# Patient Record
Sex: Female | Born: 1995 | Race: Black or African American | Hispanic: No | Marital: Single | Smoking: Current some day smoker
Health system: Southern US, Community
[De-identification: ages and names within clinical notes are randomized; demographics above are authoritative.]

## PROBLEM LIST (undated history)

## (undated) DIAGNOSIS — D473 Essential (hemorrhagic) thrombocythemia: Secondary | ICD-10-CM

## (undated) DIAGNOSIS — I272 Pulmonary hypertension, unspecified: Secondary | ICD-10-CM

## (undated) DIAGNOSIS — Z5189 Encounter for other specified aftercare: Secondary | ICD-10-CM

## (undated) DIAGNOSIS — N390 Urinary tract infection, site not specified: Secondary | ICD-10-CM

## (undated) DIAGNOSIS — J40 Bronchitis, not specified as acute or chronic: Secondary | ICD-10-CM

## (undated) DIAGNOSIS — D571 Sickle-cell disease without crisis: Secondary | ICD-10-CM

## (undated) DIAGNOSIS — B019 Varicella without complication: Secondary | ICD-10-CM

## (undated) DIAGNOSIS — F32A Depression, unspecified: Secondary | ICD-10-CM

## (undated) DIAGNOSIS — R011 Cardiac murmur, unspecified: Secondary | ICD-10-CM

## (undated) DIAGNOSIS — R7989 Other specified abnormal findings of blood chemistry: Secondary | ICD-10-CM

## (undated) DIAGNOSIS — E559 Vitamin D deficiency, unspecified: Secondary | ICD-10-CM

## (undated) DIAGNOSIS — F329 Major depressive disorder, single episode, unspecified: Secondary | ICD-10-CM

## (undated) HISTORY — DX: Sickle-cell disease without crisis: D57.1

## (undated) HISTORY — DX: Urinary tract infection, site not specified: N39.0

## (undated) HISTORY — DX: Vitamin D deficiency, unspecified: E55.9

## (undated) HISTORY — DX: Major depressive disorder, single episode, unspecified: F32.9

## (undated) HISTORY — DX: Varicella without complication: B01.9

## (undated) HISTORY — DX: Depression, unspecified: F32.A

## (undated) HISTORY — PX: EYE SURGERY: SHX253

## (undated) HISTORY — DX: Cardiac murmur, unspecified: R01.1

## (undated) HISTORY — DX: Bronchitis, not specified as acute or chronic: J40

## (undated) HISTORY — DX: Encounter for other specified aftercare: Z51.89

## (undated) HISTORY — DX: Pulmonary hypertension, unspecified: I27.20

---
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
## 2013-12-07 ENCOUNTER — Ambulatory Visit (HOSPITAL_COMMUNITY): Admission: RE | Admit: 2013-12-07 | Payer: Self-pay | Source: Ambulatory Visit | Admitting: Obstetrics and Gynecology

## 2013-12-07 ENCOUNTER — Encounter (HOSPITAL_COMMUNITY): Admission: RE | Payer: Self-pay | Source: Ambulatory Visit

## 2013-12-07 SURGERY — LAPAROSCOPY, DIAGNOSTIC
Anesthesia: General | Laterality: Right

## 2014-05-29 DIAGNOSIS — N9489 Other specified conditions associated with female genital organs and menstrual cycle: Secondary | ICD-10-CM | POA: Insufficient documentation

## 2015-06-05 ENCOUNTER — Encounter: Payer: Self-pay | Admitting: Internal Medicine

## 2015-06-05 ENCOUNTER — Ambulatory Visit (INDEPENDENT_AMBULATORY_CARE_PROVIDER_SITE_OTHER): Payer: 59 | Admitting: Internal Medicine

## 2015-06-05 VITALS — BP 122/60 | HR 78 | Temp 99.3°F | Ht 62.5 in | Wt 124.0 lb

## 2015-06-05 DIAGNOSIS — D571 Sickle-cell disease without crisis: Secondary | ICD-10-CM | POA: Diagnosis not present

## 2015-06-05 DIAGNOSIS — J209 Acute bronchitis, unspecified: Secondary | ICD-10-CM

## 2015-06-05 MED ORDER — AMOXICILLIN-POT CLAVULANATE 875-125 MG PO TABS: 1.0000 | ORAL_TABLET | Freq: Two times a day (BID) | ORAL | Status: DC

## 2015-06-05 NOTE — Progress Notes (Signed)
   Subjective:    Patient ID: Sandra Mckay, female    DOB: 1996/07/06, 19 y.o.   MRN: [id number]  HPI She describes a cough intermittently productive of clear-yellow/green sputum up to a tablespoon a day for the last month. She's had associated itchy, watery eyes as well as sneezing. She also has nasal congestion and pain in the frontal sinuses and paranasal areas. She has some yellow nasal discharge.She also does describe some right mandibular pain. Intermittent she's had some shortness of breath and wheezing with the cough. She has used Benadryl, cough drops, and tea with only partial response.  She smokes 3 cigarettes per week or less.  She has sickle cell disease & is followed at [facility]. She has requesting a referral to Hematology division of [facility] as she works here in [place]. She is on maintenance Amoxicillin 250 mg bid.  She has recently has had myalgias. She has chronic joint symptoms.  Past medical history includes splenectomy. She states that she has had her pneumonia vaccines.   Review of Systems She denies fever, chills, or sweats.   She denies otic pain or otic discharge.     Objective:   Physical Exam  General appearance:Adequately nourished; no acute distress or increased work of breathing is present.    Lymphatic: No  lymphadenopathy about the head, neck, or axilla .  Eyes: No conjunctival inflammation or lid edema is present. There is no scleral icterus.  Ears:  External ear exam shows no significant lesions or deformities.  Otoscopic examination reveals clear canals, tympanic membranes are intact bilaterally without bulging, retraction, inflammation or discharge.  Nose:  External nasal examination shows no deformity or inflammation. Nasal mucosa are markedly erythematous without lesions or exudates. Some polypoid change in the left nare. She has a small nasal post on the right. No septal dislocation or deviation.No obstruction to airflow.   Oral exam: Dental  hygiene is good; lips and gums are healthy appearing.There is no oropharyngeal erythema or exudate .  Neck:  No deformities, thyromegaly, masses, or tenderness noted.   Supple with full range of motion without pain.   Heart:  Normal rate and regular rhythm. S1 and S2 normal without gallop, murmur, click, rub or other extra sounds.   Lungs:Chest clear to auscultation; no wheezes, rhonchi,rales ,or rubs present.  Extremities:  No cyanosis, edema, or clubbing  noted    Skin: Warm & dry w/o tenting or jaundice. No significant lesions or rash.        Assessment & Plan:  #1 acute bronchitis w/o bronchospasm #2 URI, acute Plan: See orders and recommendations

## 2015-06-05 NOTE — Patient Instructions (Addendum)
Stop the amoxicillin while you are on the Augmentin. Take the Augmentin every 12 hours on a full stomach to prevent diarrhea.  Plain Mucinex (NOT D) for thick secretions ;force NON dairy fluids .   Nasal cleansing in the shower as discussed with lather of mild shampoo.After 10 seconds wash off lather while  exhaling through nostrils. Make sure that all residual soap is removed to prevent irritation.  Flonase OR Nasacort AQ 1 spray in each nostril twice a day as needed. Use the "crossover" technique into opposite nostril spraying toward opposite ear @ 45 degree angle, not straight up into nostril.  Plain Allegra (NOT D )  160 daily , Loratidine 10 mg , OR Zyrtec 10 mg @ bedtime  as needed for itchy eyes & sneezing.  The Hematology referral will be scheduled and you'll be notified of the time.Please call the Referral Co-Ordinator @ [phone] if you have not been notified of appointment time within 7-10 days.

## 2015-06-08 ENCOUNTER — Encounter: Payer: Self-pay | Admitting: Internal Medicine

## 2015-06-08 DIAGNOSIS — D571 Sickle-cell disease without crisis: Secondary | ICD-10-CM | POA: Insufficient documentation

## 2015-07-22 ENCOUNTER — Ambulatory Visit (INDEPENDENT_AMBULATORY_CARE_PROVIDER_SITE_OTHER): Payer: 59 | Admitting: Family Medicine

## 2015-07-22 ENCOUNTER — Encounter: Payer: Self-pay | Admitting: Family Medicine

## 2015-07-22 VITALS — BP 118/71 | HR 80 | Temp 98.3°F | Resp 16 | Ht 62.0 in | Wt 131.0 lb

## 2015-07-22 DIAGNOSIS — A499 Bacterial infection, unspecified: Secondary | ICD-10-CM

## 2015-07-22 DIAGNOSIS — E559 Vitamin D deficiency, unspecified: Secondary | ICD-10-CM

## 2015-07-22 DIAGNOSIS — Z23 Encounter for immunization: Secondary | ICD-10-CM

## 2015-07-22 DIAGNOSIS — N898 Other specified noninflammatory disorders of vagina: Secondary | ICD-10-CM

## 2015-07-22 DIAGNOSIS — R8290 Unspecified abnormal findings in urine: Secondary | ICD-10-CM | POA: Diagnosis not present

## 2015-07-22 DIAGNOSIS — N760 Acute vaginitis: Secondary | ICD-10-CM | POA: Diagnosis not present

## 2015-07-22 DIAGNOSIS — D571 Sickle-cell disease without crisis: Secondary | ICD-10-CM

## 2015-07-22 DIAGNOSIS — B9689 Other specified bacterial agents as the cause of diseases classified elsewhere: Secondary | ICD-10-CM

## 2015-07-22 LAB — CBC WITH DIFFERENTIAL/PLATELET
BASOS ABS: 0.1 10*3/uL (ref 0.0–0.1)
Basophils Relative: 1 % (ref 0–1)
EOS ABS: 0.1 10*3/uL (ref 0.0–0.7)
Eosinophils Relative: 1 % (ref 0–5)
HCT: 23.8 % — ABNORMAL LOW (ref 36.0–46.0)
HEMOGLOBIN: 8.2 g/dL — AB (ref 12.0–15.0)
LYMPHS ABS: 3.1 10*3/uL (ref 0.7–4.0)
Lymphocytes Relative: 37 % (ref 12–46)
MCH: 39.2 pg — AB (ref 26.0–34.0)
MCHC: 34.5 g/dL (ref 30.0–36.0)
MCV: 113.9 fL — AB (ref 78.0–100.0)
MONOS PCT: 21 % — AB (ref 3–12)
MPV: 8.9 fL (ref 8.6–12.4)
Monocytes Absolute: 1.8 10*3/uL — ABNORMAL HIGH (ref 0.1–1.0)
NEUTROS ABS: 3.4 10*3/uL (ref 1.7–7.7)
NEUTROS PCT: 40 % — AB (ref 43–77)
PLATELETS: 490 10*3/uL — AB (ref 150–400)
RBC: 2.09 MIL/uL — ABNORMAL LOW (ref 3.87–5.11)
RDW: 18.3 % — ABNORMAL HIGH (ref 11.5–15.5)
WBC: 8.4 10*3/uL (ref 4.0–10.5)

## 2015-07-22 LAB — COMPLETE METABOLIC PANEL WITH GFR
ALT: 7 U/L (ref 6–29)
AST: 22 U/L (ref 10–30)
Albumin: 4.4 g/dL (ref 3.6–5.1)
Alkaline Phosphatase: 58 U/L (ref 33–115)
BILIRUBIN TOTAL: 3.6 mg/dL — AB (ref 0.2–1.2)
BUN: 7 mg/dL (ref 7–25)
CHLORIDE: 106 mmol/L (ref 98–110)
CO2: 21 mmol/L (ref 20–31)
CREATININE: 0.39 mg/dL — AB (ref 0.50–1.10)
Calcium: 9.1 mg/dL (ref 8.6–10.2)
GFR, Est African American: >89 mL/min (ref >=60)
GFR, Est Non African American: >89 mL/min (ref >=60)
GLUCOSE: 81 mg/dL (ref 65–99)
Potassium: 4.4 mmol/L (ref 3.5–5.3)
SODIUM: 135 mmol/L (ref 135–146)
TOTAL PROTEIN: 6.7 g/dL (ref 6.1–8.1)

## 2015-07-22 LAB — POCT WET PREP WITH KOH
KOH PREP POC: NEGATIVE
TRICHOMONAS UA: NEGATIVE

## 2015-07-22 LAB — RETICULOCYTES
ABS Retic: 415.9 10*3/uL — ABNORMAL HIGH (ref 19.0–186.0)
RBC.: 2.09 MIL/uL — ABNORMAL LOW (ref 3.87–5.11)
Retic Ct Pct: 19.9 % — ABNORMAL HIGH (ref 0.4–2.3)

## 2015-07-22 LAB — FERRITIN: Ferritin: 499 ng/mL — ABNORMAL HIGH (ref 10–291)

## 2015-07-22 MED ORDER — CLINDAMYCIN HCL 300 MG PO CAPS: 300.0000 mg | ORAL_CAPSULE | Freq: Two times a day (BID) | ORAL | Status: DC

## 2015-07-22 MED ORDER — OXYCODONE HCL 10 MG PO TABS: 10.0000 mg | ORAL_TABLET | Freq: Four times a day (QID) | ORAL | Status: DC | PRN

## 2015-07-22 MED ORDER — HYDROXYUREA 300 MG PO CAPS: 300.0000 mg | ORAL_CAPSULE | Freq: Every day | ORAL | Status: DC

## 2015-07-22 MED ORDER — IBUPROFEN 600 MG PO TABS: 600.0000 mg | ORAL_TABLET | Freq: Three times a day (TID) | ORAL | Status: DC | PRN

## 2015-07-22 MED ORDER — HYDROXYUREA 500 MG PO CAPS: 1500.0000 mg | ORAL_CAPSULE | Freq: Every day | ORAL | Status: DC

## 2015-07-22 NOTE — Progress Notes (Signed)
Subjective:    Patient ID: Sandra Mckay, female    DOB: 1996/03/18, 19 y.o.   MRN: [id number]  HPI Sandra Mckay, a 19 year old female with a history of sickle cell anemia, HbSS presents accompanied by mother to establish care. Sandra Mckay states that she has been followed by hematology at [facility], [place]. She states that she is currently followed every 3 months. She is taking hydroxyurea consistently. Patient is asplenic and takes a prophylactic antibiotic. She is currently having pain to lower back and lower extremities bilaterally. She rates pain as 7/10 described as constant and aching. She maintains that she last had Ibuprofen this am with minimal relief. She typically takes Oxycodone for moderate to severe pain, but is out of medication. She also has a history of a vitamin D deficiency and has not been taking weekly Drisdol as prescribed. Patient denies headache, fever, shortness of breath, chest pain, nausea, vomiting, or diarrhea.    Past Medical History  Diagnosis Date  . Sickle cell anemia (HCC)   . Heart murmur   . Blood transfusion without reported diagnosis   . Depression   . Chickenpox   . Bronchitis   . Urinary tract infection    There is no immunization history on file for this patient.  Social History   Social History  . Marital Status: Single    Spouse Name: N/A  . Number of Children: N/A  . Years of Education: N/A   Occupational History  . Not on file.   Social History Main Topics  . Smoking status: Current Some Day Smoker  . Smokeless tobacco: Not on file  . Alcohol Use: 0.0 oz/week    0 Standard drinks or equivalent per week  . Drug Use: Yes  . Sexual Activity: Not on file   Other Topics Concern  . Not on file   Social History Narrative  No Known Allergies Review of Systems  Constitutional: Negative.   HENT: Negative.   Eyes: Negative.  Negative for photophobia and visual disturbance.  Respiratory:  Negative.  Negative for wheezing and stridor.   Cardiovascular: Negative.   Gastrointestinal: Positive for diarrhea.  Endocrine: Negative.   Genitourinary: Positive for dysuria and vaginal discharge. Negative for hematuria.  Musculoskeletal: Negative.   Skin: Negative.   Allergic/Immunologic: Negative.  Negative for immunocompromised state.  Neurological: Negative.   Hematological: Negative.   Psychiatric/Behavioral: Negative.  Negative for suicidal ideas and sleep disturbance.      Objective:   Physical Exam  Constitutional: She is oriented to person, place, and time. She appears well-developed and well-nourished.  HENT:  Head: Normocephalic and atraumatic.  Right Ear: External ear normal.  Left Ear: External ear normal.  Mouth/Throat: Oropharynx is clear and moist.  Eyes: Conjunctivae and EOM are normal. Pupils are equal, round, and reactive to light.  Neck: Normal range of motion. Neck supple.  Cardiovascular: Normal rate, normal heart sounds and intact distal pulses.   Pulmonary/Chest: Effort normal and breath sounds normal.  Abdominal: Soft. Bowel sounds are normal.  Musculoskeletal: Normal range of motion.  Neurological: She is alert and oriented to person, place, and time. She has normal reflexes.  Skin: Skin is warm and dry.  Psychiatric: She has a normal mood and affect. Her behavior is normal. Judgment and thought content normal.      BP 118/71 mmHg  Pulse 80  Temp(Src) 98.3 F (36.8 C) (Oral)  Resp 16  Ht 5\' 2"  (1.575 m)  Wt 131 lb (59.421 kg)  BMI 23.95 kg/m2  Assessment & Plan:  1. Hb-SS disease without crisis ([place]) Sickle cell disease - Continue Hydrea 1800 mg daily. Will check baseline reticulocytes, hemoglobin, platelet count, and ANC. We discussed the need for good hydration, monitoring of hydration status, avoidance of heat, cold, stress, and infection triggers. We discussed the risks and benefits of Hydrea, including bone marrow suppression, the  possibility of GI upset, skin ulcers, hair thinning, and teratogenicity. The patient was reminded of the need to seek medical attention of any symptoms of bleeding, anemia, or infection. Continue folic acid 1 mg daily to prevent aplastic bone marrow crises. No proteinuria present.   Pulmonary evaluation - Patient denies severe recurrent wheezes, shortness of breath with exercise, or persistent cough. If these symptoms develop, pulmonary function tests with spirometry will be ordered, and if abnormal, plan on referral to Pulmonology for further evaluation.  Cardiac - Routine screening for pulmonary hypertension is not recommended.  Eye - High risk of proliferative retinopathy. Annual eye exam with retinal exam recommended to patient. Last eye appointment was in September. Dr. Shaune Spittle.   Immunization status - Patient will receive an influenza vaccination on today. Will review [facility].   Acute and chronic painful episodes - We agreed on Oxycodone 10 mg daily every 6 hours as needed # 60. We discussed that pt is to receive her Schedule II prescriptions only from [place]. Pt is also aware that the prescription history is available to [place] online through the [facility] CSRS. Controlled substance agreement signed today. We reminded Sandra Mckay that all patients receiving Schedule II narcotics must be seen for follow within one month of prescription being requested. We reviewed the terms of our pain agreement, including the need to keep medicines in a safe locked location away from children or pets, and the need to report excess sedation or constipation, measures to avoid constipation, and policies related to early refills and stolen prescriptions. According to the [place] Chronic [facility] Initiative program, we have reviewed details related to analgesia, adverse effects, aberrant behaviors.   Iron overload from chronic transfusion.  Will check ferritin level today  Vitamin D deficiency - Continue Drisdol 50,000 units weekly,   encouraged her to take it.   The above recommendations are taken from the NIH Evidence-Based Management of Sickle Cell Disease: Expert Panel Report, 20149.   Chronic medical problems including diabetes, hypertension and COPD. We recommended she try to find a PCP in [place].  - EKG 12-Lead - POCT urinalysis dipstick - COMPLETE METABOLIC PANEL WITH GFR - Reticulocytes - CBC with Differential/Platelet - oxyCODONE 10 MG TABS; Take 1 tablet (10 mg total) by mouth every 6 (six) hours as needed. for pain  Dispense: 60 tablet; Refill: 0 - hydroxyurea (HYDREA) 500 MG capsule; Take 3 capsules (1,500 mg total) by mouth daily.  Dispense: 90 capsule; Refill: 0 - hydroxyurea (DROXIA) 300 MG capsule; Take 1 capsule (300 mg total) by mouth daily.  Dispense: 30 capsule; Refill: 0 - ibuprofen (ADVIL,MOTRIN) 600 MG tablet; Take 1 tablet (600 mg total) by mouth every 8 (eight) hours as needed.  Dispense: 30 tablet; Refill: 0 - Hemoglobinopathy evaluation - Ferritin - Ambulatory referral to Hematology  2. Vitamin D deficiency  - Vitamin D, 25-hydroxy  3. Bacterial vaginosis 5-7 Clue Cells per high power field.  - clindamycin (CLEOCIN) 300 MG capsule; Take 1 capsule (300 mg total) by mouth 2 (two) times daily.  Dispense: 14 capsule; Refill: 0  4. Abnormal finding on  urinalysis Positive nitrites. Refer to #3  5. Need for immunization against influenza - Flu Vaccine QUAD 36+ mos IM (Fluarix)   Routine Health Maintenance:  Recommend pap smear Recommend barrier protection with sexual intercourse Refrain from smoking or secondhand smoke   Tasheema Perrone M, FNP

## 2015-07-22 NOTE — Patient Instructions (Addendum)
[facility] is available for acute vasoocclusive pain crisis. Hours of operation M-F 8am- 5pm.  Sickle Cell Anemia, Adult Sickle cell anemia is a condition in which red blood cells have an abnormal "sickle" shape. This abnormal shape shortens the cells' life span, which results in a lower than normal concentration of red blood cells in the blood. The sickle shape also causes the cells to clump together and block free blood flow through the blood vessels. As a result, the tissues and organs of the body do not receive enough oxygen. Sickle cell anemia causes organ damage and pain and increases the risk of infection. CAUSES  Sickle cell anemia is a genetic disorder. Those who receive two copies of the gene have the condition, and those who receive one copy have the trait. RISK FACTORS The sickle cell gene is most common in people whose families originated in [place]. Other areas of the globe where sickle cell trait occurs include the [place], [place] and [place], and the [place].  SIGNS AND SYMPTOMS  Pain, especially in the extremities, back, chest, or abdomen (common). The pain may start suddenly or may develop following an illness, especially if there is dehydration. Pain can also occur due to overexertion or exposure to extreme temperature changes.  Frequent severe bacterial infections, especially certain types of pneumonia and meningitis.  Pain and swelling in the hands and feet.  Decreased activity.   Loss of appetite.   Change in behavior.  Headaches.  Seizures.  Shortness of breath or difficulty breathing.  Vision changes.  Skin ulcers. Those with the trait may not have symptoms or they may have mild symptoms.  DIAGNOSIS  Sickle cell anemia is diagnosed with blood tests that demonstrate the genetic trait. It is often diagnosed during the newborn period, due to mandatory testing nationwide. A variety of blood tests, X-rays, CT scans, MRI  scans, ultrasounds, and lung function tests may also be done to monitor the condition. TREATMENT  Sickle cell anemia may be treated with:  Medicines. You may be given pain medicines, antibiotic medicines (to treat and prevent infections) or medicines to increase the production of certain types of hemoglobin.  Fluids.  Oxygen.  Blood transfusions. HOME CARE INSTRUCTIONS   Drink enough fluid to keep your urine clear or pale yellow. Increase your fluid intake in hot weather and during exercise.  Do not smoke. Smoking lowers oxygen levels in the blood.   Only take over-the-counter or prescription medicines for pain, fever, or discomfort as directed by your health care provider.  Take antibiotics as directed by your health care provider. Make sure you finish them it even if you start to feel better.   Take supplements as directed by your health care provider.   Consider wearing a medical alert bracelet. This tells anyone caring for you in an emergency of your condition.   When traveling, keep your medical information, health care provider's names, and the medicines you take with you at all times.   If you develop a fever, do not take medicines to reduce the fever right away. This could cover up a problem that is developing. Notify your health care provider.  Keep all follow-up appointments with your health care provider. Sickle cell anemia requires regular medical care. SEEK MEDICAL CARE IF: You have a fever. SEEK IMMEDIATE MEDICAL CARE IF:   You feel dizzy or faint.   You have new abdominal pain, especially on the left side near the stomach area.  You develop a persistent, often uncomfortable and painful penile erection (priapism). If this is not treated immediately it will lead to impotence.   You have numbness your arms or legs or you have a hard time moving them.   You have a hard time with speech.   You have a fever or persistent symptoms for more than 2-3  days.   You have a fever and your symptoms suddenly get worse.   You have signs or symptoms of infection. These include:   Chills.   Abnormal tiredness (lethargy).   Irritability.   Poor eating.   Vomiting.   You develop pain that is not helped with medicine.   You develop shortness of breath.  You have pain in your chest.   You are coughing up pus-like or bloody sputum.   You develop a stiff neck.  Your feet or hands swell or have pain.  Your abdomen appears bloated.  You develop joint pain. MAKE SURE YOU:  Understand these instructions.   This information is not intended to replace advice given to you by your health care provider. Make sure you discuss any questions you have with your health care provider.   Document Released: 10/28/2005 Document Revised: 08/10/2014 Document Reviewed: 03/01/2013 Elsevier Interactive Patient Education 2016 Elsevier Inc. Bacterial Vaginosis Bacterial vaginosis is a vaginal infection that occurs when the normal balance of bacteria in the vagina is disrupted. It results from an overgrowth of certain bacteria. This is the most common vaginal infection in women of childbearing age. Treatment is important to prevent complications, especially in pregnant women, as it can cause a premature delivery. CAUSES  Bacterial vaginosis is caused by an increase in harmful bacteria that are normally present in smaller amounts in the vagina. Several different kinds of bacteria can cause bacterial vaginosis. However, the reason that the condition develops is not fully understood. RISK FACTORS Certain activities or behaviors can put you at an increased risk of developing bacterial vaginosis, including:  Having a new sex partner or multiple sex partners.  Douching.  Using an intrauterine device (IUD) for contraception. Women do not get bacterial vaginosis from toilet seats, bedding, swimming pools, or contact with objects around them. SIGNS  AND SYMPTOMS  Some women with bacterial vaginosis have no signs or symptoms. Common symptoms include:  Grey vaginal discharge.  A fishlike odor with discharge, especially after sexual intercourse.  Itching or burning of the vagina and vulva.  Burning or pain with urination. DIAGNOSIS  Your health care provider will take a medical history and examine the vagina for signs of bacterial vaginosis. A sample of vaginal fluid may be taken. Your health care provider will look at this sample under a microscope to check for bacteria and abnormal cells. A vaginal pH test may also be done.  TREATMENT  Bacterial vaginosis may be treated with antibiotic medicines. These may be given in the form of a pill or a vaginal cream. A second round of antibiotics may be prescribed if the condition comes back after treatment. Because bacterial vaginosis increases your risk for sexually transmitted diseases, getting treated can help reduce your risk for chlamydia, gonorrhea, HIV, and herpes. HOME CARE INSTRUCTIONS   Only take over-the-counter or prescription medicines as directed by your health care provider.  If antibiotic medicine was prescribed, take it as directed. Make sure you finish it even if you start to feel better.  Tell all sexual partners that you have a vaginal infection. They should see their health care provider  and be treated if they have problems, such as a mild rash or itching.  During treatment, it is important that you follow these instructions:  Avoid sexual activity or use condoms correctly.  Do not douche.  Avoid alcohol as directed by your health care provider.  Avoid breastfeeding as directed by your health care provider. SEEK MEDICAL CARE IF:   Your symptoms are not improving after 3 days of treatment.  You have increased discharge or pain.  You have a fever. MAKE SURE YOU:   Understand these instructions.  Will watch your condition.  Will get help right away if you are  not doing well or get worse. FOR MORE INFORMATION  Centers for Disease Control and Prevention, Division of STD Prevention: [employer]raud[employer] American Sexual Health Association (ASHA): www.ashastd.org    This information is not intended to replace advice given to you by your health care provider. Make sure you discuss any questions you have with your health care provider.   Document Released: 07/20/2005 Document Revised: 08/10/2014 Document Reviewed: 03/01/2013 Elsevier Interactive Patient Education [employer].

## 2015-07-23 LAB — VITAMIN D 25 HYDROXY (VIT D DEFICIENCY, FRACTURES): VIT D 25 HYDROXY: 10 ng/mL — AB (ref 30–100)

## 2015-07-23 LAB — POCT URINALYSIS DIP (DEVICE)
Bilirubin Urine: NEGATIVE
Glucose, UA: NEGATIVE mg/dL
Ketones, ur: NEGATIVE mg/dL
NITRITE: POSITIVE — AB
PH: 7 (ref 5.0–8.0)
Protein, ur: NEGATIVE mg/dL
Specific Gravity, Urine: 1.015 (ref 1.005–1.030)
UROBILINOGEN UA: 2 mg/dL — AB (ref 0.0–1.0)

## 2015-07-24 LAB — HEMOGLOBINOPATHY EVALUATION
HGB A: 0 % — AB (ref 96.8–97.8)
HGB F QUANT: 16.5 % — AB (ref 0.0–2.0)
HGB S QUANTITAION: 80.5 % — AB
Hemoglobin Other: 0 %
Hgb A2 Quant: 3 % (ref 2.2–3.2)

## 2015-08-18 ENCOUNTER — Other Ambulatory Visit: Payer: Self-pay | Admitting: Family Medicine

## 2015-08-24 ENCOUNTER — Encounter (HOSPITAL_COMMUNITY): Payer: Self-pay | Admitting: *Deleted

## 2015-08-24 ENCOUNTER — Emergency Department (HOSPITAL_COMMUNITY)
Admission: EM | Admit: 2015-08-24 | Discharge: 2015-08-25 | Disposition: A | Discharge status: Home / Self Care | Payer: 59 | Attending: Emergency Medicine | Admitting: Emergency Medicine

## 2015-08-24 DIAGNOSIS — Z87440 Personal history of urinary (tract) infections: Secondary | ICD-10-CM | POA: Diagnosis not present

## 2015-08-24 DIAGNOSIS — F329 Major depressive disorder, single episode, unspecified: Secondary | ICD-10-CM | POA: Diagnosis not present

## 2015-08-24 DIAGNOSIS — R011 Cardiac murmur, unspecified: Secondary | ICD-10-CM | POA: Insufficient documentation

## 2015-08-24 DIAGNOSIS — Z79899 Other long term (current) drug therapy: Secondary | ICD-10-CM | POA: Diagnosis not present

## 2015-08-24 DIAGNOSIS — Z8619 Personal history of other infectious and parasitic diseases: Secondary | ICD-10-CM | POA: Insufficient documentation

## 2015-08-24 DIAGNOSIS — Z8709 Personal history of other diseases of the respiratory system: Secondary | ICD-10-CM | POA: Insufficient documentation

## 2015-08-24 DIAGNOSIS — D5700 Hb-SS disease with crisis, unspecified: Secondary | ICD-10-CM | POA: Diagnosis present

## 2015-08-24 DIAGNOSIS — Z87891 Personal history of nicotine dependence: Secondary | ICD-10-CM | POA: Diagnosis not present

## 2015-08-24 LAB — CBC WITH DIFFERENTIAL/PLATELET
BASOS ABS: 0.1 10*3/uL (ref 0.0–0.1)
BASOS PCT: 1 %
Eosinophils Absolute: 0 10*3/uL (ref 0.0–0.7)
Eosinophils Relative: 0 %
HCT: 21.8 % — ABNORMAL LOW (ref 36.0–46.0)
Hemoglobin: 7.8 g/dL — ABNORMAL LOW (ref 12.0–15.0)
Lymphocytes Relative: 38 %
Lymphs Abs: 3.7 10*3/uL (ref 0.7–4.0)
MCH: 39.2 pg — ABNORMAL HIGH (ref 26.0–34.0)
MCHC: 35.8 g/dL (ref 30.0–36.0)
MCV: 109.5 fL — AB (ref 78.0–100.0)
MONOS PCT: 19 %
Monocytes Absolute: 1.9 10*3/uL — ABNORMAL HIGH (ref 0.1–1.0)
NEUTROS ABS: 4.1 10*3/uL (ref 1.7–7.7)
NEUTROS PCT: 42 %
PLATELETS: 476 10*3/uL — AB (ref 150–400)
RBC: 1.99 MIL/uL — AB (ref 3.87–5.11)
RDW: 18.5 % — AB (ref 11.5–15.5)
WBC: 9.5 10*3/uL (ref 4.0–10.5)

## 2015-08-24 LAB — COMPREHENSIVE METABOLIC PANEL
ALBUMIN: 5.3 g/dL — AB (ref 3.5–5.0)
ALK PHOS: 71 U/L (ref 38–126)
ALT: 15 U/L (ref 14–54)
AST: 37 U/L (ref 15–41)
Anion gap: 10 (ref 5–15)
BUN: 7 mg/dL (ref 6–20)
CALCIUM: 9.5 mg/dL (ref 8.9–10.3)
CO2: 22 mmol/L (ref 22–32)
Chloride: 106 mmol/L (ref 101–111)
Creatinine, Ser: 0.39 mg/dL — ABNORMAL LOW (ref 0.44–1.00)
Glucose, Bld: 102 mg/dL — ABNORMAL HIGH (ref 65–99)
Potassium: 4 mmol/L (ref 3.5–5.1)
Sodium: 138 mmol/L (ref 135–145)
Total Bilirubin: 4.2 mg/dL — ABNORMAL HIGH (ref 0.3–1.2)
Total Protein: 7.7 g/dL (ref 6.5–8.1)

## 2015-08-24 LAB — RETICULOCYTES
RBC.: 1.97 MIL/uL — ABNORMAL LOW (ref 3.87–5.11)
Retic Count, Absolute: 449.2 10*3/uL — ABNORMAL HIGH (ref 19.0–186.0)
Retic Ct Pct: 22.8 % — ABNORMAL HIGH (ref 0.4–3.1)

## 2015-08-24 MED ORDER — SODIUM CHLORIDE 0.9 % IV BOLUS (SEPSIS)
1000.0000 mL | Freq: Once | INTRAVENOUS | Status: AC
  Administered 2015-08-24: 1000 mL via INTRAVENOUS

## 2015-08-24 MED ORDER — HYDROMORPHONE HCL 2 MG/ML IJ SOLN
2.0000 mg | Freq: Once | INTRAMUSCULAR | Status: AC
  Administered 2015-08-24: 2 mg via INTRAVENOUS
  Filled 2015-08-24: qty 1

## 2015-08-24 MED ORDER — HYDROMORPHONE HCL 1 MG/ML IJ SOLN
1.0000 mg | Freq: Once | INTRAMUSCULAR | Status: AC
Start: 2015-08-24 — End: 2015-08-24
  Administered 2015-08-24: 1 mg via INTRAVENOUS
  Filled 2015-08-24: qty 1

## 2015-08-24 MED ORDER — ONDANSETRON HCL 4 MG/2ML IJ SOLN
4.0000 mg | Freq: Once | INTRAMUSCULAR | Status: AC
  Administered 2015-08-24: 4 mg via INTRAVENOUS
  Filled 2015-08-24: qty 2

## 2015-08-24 NOTE — Discharge Instructions (Signed)
Please follow up with your pain management specialist and provider managing sickle cell. Please read attached info and sickle cell center follow-up information   Sickle Cell Anemia, Adult Sickle cell anemia is a condition in which red blood cells have an abnormal "sickle" shape. This abnormal shape shortens the cells' life span, which results in a lower than normal concentration of red blood cells in the blood. The sickle shape also causes the cells to clump together and block free blood flow through the blood vessels. As a result, the tissues and organs of the body do not receive enough oxygen. Sickle cell anemia causes organ damage and pain and increases the risk of infection. CAUSES  Sickle cell anemia is a genetic disorder. Those who receive two copies of the gene have the condition, and those who receive one copy have the trait. RISK FACTORS The sickle cell gene is most common in people whose families originated in [place]. Other areas of the globe where sickle cell trait occurs include the [place], [place] and [place], and the [place].  SIGNS AND SYMPTOMS  Pain, especially in the extremities, back, chest, or abdomen (common). The pain may start suddenly or may develop following an illness, especially if there is dehydration. Pain can also occur due to overexertion or exposure to extreme temperature changes.  Frequent severe bacterial infections, especially certain types of pneumonia and meningitis.  Pain and swelling in the hands and feet.  Decreased activity.   Loss of appetite.   Change in behavior.  Headaches.  Seizures.  Shortness of breath or difficulty breathing.  Vision changes.  Skin ulcers. Those with the trait may not have symptoms or they may have mild symptoms.  DIAGNOSIS  Sickle cell anemia is diagnosed with blood tests that demonstrate the genetic trait. It is often diagnosed during the newborn period, due to mandatory testing  nationwide. A variety of blood tests, X-rays, CT scans, MRI scans, ultrasounds, and lung function tests may also be done to monitor the condition. TREATMENT  Sickle cell anemia may be treated with:  Medicines. You may be given pain medicines, antibiotic medicines (to treat and prevent infections) or medicines to increase the production of certain types of hemoglobin.  Fluids.  Oxygen.  Blood transfusions. HOME CARE INSTRUCTIONS   Drink enough fluid to keep your urine clear or pale yellow. Increase your fluid intake in hot weather and during exercise.  Do not smoke. Smoking lowers oxygen levels in the blood.   Only take over-the-counter or prescription medicines for pain, fever, or discomfort as directed by your health care provider.  Take antibiotics as directed by your health care provider. Make sure you finish them it even if you start to feel better.   Take supplements as directed by your health care provider.   Consider wearing a medical alert bracelet. This tells anyone caring for you in an emergency of your condition.   When traveling, keep your medical information, health care provider's names, and the medicines you take with you at all times.   If you develop a fever, do not take medicines to reduce the fever right away. This could cover up a problem that is developing. Notify your health care provider.  Keep all follow-up appointments with your health care provider. Sickle cell anemia requires regular medical care. SEEK MEDICAL CARE IF: You have a fever. SEEK IMMEDIATE MEDICAL CARE IF:   You feel dizzy or faint.   You have new abdominal pain, especially on the left  side near the stomach area.   You develop a persistent, often uncomfortable and painful penile erection (priapism). If this is not treated immediately it will lead to impotence.   You have numbness your arms or legs or you have a hard time moving them.   You have a hard time with speech.    You have a fever or persistent symptoms for more than 2-3 days.   You have a fever and your symptoms suddenly get worse.   You have signs or symptoms of infection. These include:   Chills.   Abnormal tiredness (lethargy).   Irritability.   Poor eating.   Vomiting.   You develop pain that is not helped with medicine.   You develop shortness of breath.  You have pain in your chest.   You are coughing up pus-like or bloody sputum.   You develop a stiff neck.  Your feet or hands swell or have pain.  Your abdomen appears bloated.  You develop joint pain. MAKE SURE YOU:  Understand these instructions.   This information is not intended to replace advice given to you by your health care provider. Make sure you discuss any questions you have with your health care provider.   Document Released: 10/28/2005 Document Revised: 08/10/2014 Document Reviewed: 03/01/2013 Elsevier Interactive Patient Education [employer].

## 2015-08-24 NOTE — ED Notes (Signed)
Per pt report: pt began having pain in her lower back and bilateral legs since yesterday.  Pt took oxycodone yesterday with no relief.  Pt states pain is the same as other sickle cell crisis pains.  Pt reports nausea, vomiting, and chills.  Pt denies SOB.  Pt a/o x 4 and ambulatory.

## 2015-08-24 NOTE — ED Provider Notes (Signed)
CSN: [id number]     Arrival date & time 08/24/15  1422 History   First MD Initiated Contact with Patient 08/24/15 1522     Chief Complaint  Patient presents with  . Sickle Cell Pain Crisis    HPI   20 year old female presents today with sickle cell pain crisis. Patient reports symptoms started last night with pain in her bilateral lower legs. She describes this pain as sharp pain, typical of her typical sickle cell pain crisis sees. Patient reports that she has no chest pain, shortness of breath, abdominal pain, fever, chills, neck stiffness, headache swelling of the lower extremity. She reports that she is normally seen at [facility], but does not feel she is receiving adequate care there. She notes that she normally takes oxycodone 10 mg, but did not take any today as she has run out of her prescriptions.  Past Medical History  Diagnosis Date  . Sickle cell anemia (HCC)   . Heart murmur   . Blood transfusion without reported diagnosis   . Depression   . Chickenpox   . Bronchitis   . Urinary tract infection    Past Surgical History  Procedure Laterality Date  . Cholecystectomy  2011  . Tonsillectomy  2012  . 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 Labial adhesion lysis  1999  . Eye surgery      Sty removal   Family History  Problem Relation Age of Onset  . Arthritis Other     grandparent  . Stroke Other   . Hypertension Other   . Diabetes      grandparent  . Cancer - Other      Glioblastoma   Social History  Substance Use Topics  . Smoking status: Former Research scientist (life sciences)  . Smokeless tobacco: None  . Alcohol Use: 0.0 oz/week    0 Standard drinks or equivalent per week     Comment: occ   OB History    No data available     Review of Systems  All other systems reviewed and are negative.   Allergies  Review of patient's allergies indicates no known allergies.  Home Medications   Prior to Admission medications   Medication  Sig Start Date End Date Taking? Authorizing Provider  ibuprofen (ADVIL,MOTRIN) 200 MG tablet Take 200 mg by mouth every 6 (six) hours as needed for moderate pain.   Yes Historical Provider, MD  Oxcarbazepine (TRILEPTAL) 300 MG tablet Take 300 mg by mouth 2 (two) times daily.    Yes Historical Provider, MD  oxyCODONE 10 MG TABS Take 1 tablet (10 mg total) by mouth every 6 (six) hours as needed. for pain 07/22/15  Yes Dorena Dew, FNP  clindamycin (CLEOCIN) 300 MG capsule Take 1 capsule (300 mg total) by mouth 2 (two) times daily. Patient not taking: Reported on 08/24/2015 07/22/15   Dorena Dew, FNP  folic acid (FOLVITE) 1 MG tablet Take 1 tablet by mouth daily. 10/11/14   Historical Provider, MD  hydroxyurea (DROXIA) 300 MG capsule Take 1 capsule (300 mg total) by mouth daily. 07/22/15   Dorena Dew, FNP  hydroxyurea (HYDREA) 500 MG capsule TAKE 3 CAPSULES (1,500 MG TOTAL) BY MOUTH DAILY. 08/19/15   Dorena Dew, FNP  ibuprofen (ADVIL,MOTRIN) 600 MG tablet Take 1 tablet (600 mg total) by mouth every 8 (eight) hours as needed. 07/22/15   Dorena Dew, FNP  Vitamin D, Ergocalciferol, (DRISDOL) 50000  UNITS CAPS capsule Take 50,000 Units by mouth every 7 (seven) days. Saturday. 09/07/14 09/07/15  Historical Provider, MD   BP 96/55 mmHg  Pulse 75  Temp(Src) 97.8 F (36.6 C) (Oral)  Resp 12  SpO2 95%   Physical Exam  Constitutional: She is oriented to person, place, and time. She appears well-developed and well-nourished.  HENT:  Head: Normocephalic and atraumatic.  Eyes: Conjunctivae are normal. Pupils are equal, round, and reactive to light. Right eye exhibits no discharge. Left eye exhibits no discharge. No scleral icterus.  Neck: Normal range of motion. Neck supple. No JVD present. No tracheal deviation present.  Cardiovascular: Normal rate, regular rhythm, normal heart sounds and intact distal pulses.  Exam reveals no gallop.   No murmur heard. Pulmonary/Chest: Effort normal  and breath sounds normal. No stridor. No respiratory distress. She has no wheezes. She has no rales. She exhibits no tenderness.  Nontender to palpation  Abdominal: Soft. She exhibits no distension and no mass. There is no tenderness. There is no rebound and no guarding.  Musculoskeletal: Normal range of motion. She exhibits no edema or tenderness.  Joints are supple with full active range of motion compartments are soft. She has tenderness to palpation of the bilateral lower extremities, no swelling or edema noted.  Neurological: She is alert and oriented to person, place, and time. Coordination normal.  Skin: Skin is warm and dry. No rash noted. No erythema. No pallor.  Psychiatric: She has a normal mood and affect. Her behavior is normal. Judgment and thought content normal.  Nursing note and vitals reviewed.   ED Course  Procedures (including critical care time) Labs Review Labs Reviewed  COMPREHENSIVE METABOLIC PANEL - Abnormal; Notable for the following:    Glucose, Bld 102 (*)    Creatinine, Ser 0.39 (*)    Albumin 5.3 (*)    Total Bilirubin 4.2 (*)    All other components within normal limits  CBC WITH DIFFERENTIAL/PLATELET - Abnormal; Notable for the following:    RBC 1.99 (*)    Hemoglobin 7.8 (*)    HCT 21.8 (*)    MCV 109.5 (*)    MCH 39.2 (*)    RDW 18.5 (*)    Platelets 476 (*)    Monocytes Absolute 1.9 (*)    All other components within normal limits  RETICULOCYTES - Abnormal; Notable for the following:    Retic Ct Pct 22.8 (*)    RBC. 1.97 (*)    Retic Count, Manual 449.2 (*)    All other components within normal limits    Imaging Review No results found. I have personally reviewed and evaluated these images and lab results as part of my medical decision-making.   EKG Interpretation None      MDM   Final diagnoses:  Sickle cell pain crisis (HCC)    Labs: CMP, CBC, reticulocyte- reticulocyte count 449, hemoglobin 7.8 down from 8.2 on  07/22/2015  Imaging:   Consults:  Therapeutics:  Discharge Meds:   Assessment/Plan:  20 year old female with history sickle cell presents today and likely acute pain crisis. She has a hemoglobin 7.8 down from 8.2 on 07/22/2015. This does not indicate a significant drop in hemoglobin; reticulocyte count 449. Patient's pain was adequately managed here in the ED, she has no concerning signs for stroke, acute chest, renal infarction, or bone infarction, MI, or DVT. Patient requesting refill of her narcotic pain medication, I checked her previous records from [facility] and appears that she has  requested refills, which was against her pain contract. Patient is instructed to contact specialists at [place], sickle cell pain clinic here. She is given strict return precautions the event new or worsening signs or symptoms present. Patient verbalized understanding and agreement for today's plan.        Okey Regal, PA-C 08/24/15 [id number]  Daleen Bo, MD 08/25/15 [phone]

## 2015-08-25 ENCOUNTER — Other Ambulatory Visit: Payer: Self-pay | Admitting: Family Medicine

## 2015-08-27 ENCOUNTER — Ambulatory Visit: Payer: 59 | Admitting: Internal Medicine

## 2015-08-27 ENCOUNTER — Other Ambulatory Visit: Payer: Self-pay | Admitting: Internal Medicine

## 2015-08-27 MED ORDER — VITAMIN D (ERGOCALCIFEROL) 1.25 MG (50000 UNIT) PO CAPS: 50000.0000 [IU] | ORAL_CAPSULE | ORAL | Status: DC

## 2015-08-27 NOTE — Telephone Encounter (Signed)
Refill request for oxycodone 10mg . LOV 07/22/2015. Please advise. Thanks!

## 2015-08-30 ENCOUNTER — Telehealth: Payer: Self-pay

## 2015-08-30 ENCOUNTER — Other Ambulatory Visit: Payer: Self-pay

## 2015-08-30 DIAGNOSIS — D571 Sickle-cell disease without crisis: Secondary | ICD-10-CM

## 2015-08-30 MED ORDER — OXYCODONE HCL 10 MG PO TABS: 10.0000 mg | ORAL_TABLET | Freq: Four times a day (QID) | ORAL | Status: DC | PRN

## 2015-08-30 NOTE — Telephone Encounter (Signed)
Reviewed  Substance Reporting system prior to prescribing opiate medications, no inconsistencies noted.   Meds ordered this encounter  Medications  . Oxycodone HCl 10 MG TABS    Sig: Take 1 tablet (10 mg total) by mouth every 6 (six) hours as needed. for pain    Dispense:  60 tablet    Refill:  0    Order Specific Question:  Supervising Provider    Answer:  Tresa Garter [id number]    Dorena Dew, FNP

## 2015-08-30 NOTE — Telephone Encounter (Signed)
Refill request for oxycodone 10mg . LOV 07/22/2015. Please advise. Thanks!

## 2015-08-30 NOTE — Telephone Encounter (Signed)
I tried to call patient to advise of rx ready for pick up. Phone when straight to answering service and no voicemail has been set up yet. Thanks!

## 2015-09-09 ENCOUNTER — Encounter: Payer: Self-pay | Admitting: Family Medicine

## 2015-09-09 ENCOUNTER — Ambulatory Visit (INDEPENDENT_AMBULATORY_CARE_PROVIDER_SITE_OTHER): Payer: 59 | Admitting: Family Medicine

## 2015-09-09 VITALS — BP 111/69 | HR 76 | Temp 98.4°F | Resp 16 | Ht 62.0 in | Wt 128.0 lb

## 2015-09-09 DIAGNOSIS — D571 Sickle-cell disease without crisis: Secondary | ICD-10-CM

## 2015-09-09 DIAGNOSIS — R8290 Unspecified abnormal findings in urine: Secondary | ICD-10-CM | POA: Diagnosis not present

## 2015-09-09 DIAGNOSIS — R110 Nausea: Secondary | ICD-10-CM

## 2015-09-09 HISTORY — DX: Nausea: R11.0

## 2015-09-09 LAB — POCT URINALYSIS DIP (DEVICE)
GLUCOSE, UA: NEGATIVE mg/dL
KETONES UR: NEGATIVE mg/dL
LEUKOCYTES UA: NEGATIVE
Nitrite: POSITIVE — AB
Protein, ur: NEGATIVE mg/dL
SPECIFIC GRAVITY, URINE: 1.015 (ref 1.005–1.030)
Urobilinogen, UA: 2 mg/dL — ABNORMAL HIGH (ref 0.0–1.0)
pH: 6 (ref 5.0–8.0)

## 2015-09-09 MED ORDER — ONDANSETRON HCL 4 MG PO TABS: 4.0000 mg | ORAL_TABLET | Freq: Three times a day (TID) | ORAL | Status: DC | PRN

## 2015-09-09 NOTE — Progress Notes (Signed)
Subjective:    Patient ID: Sandra Mckay, female    DOB: 1995/12/29, 20 y.o.   MRN: [id number]  HPI [name], a 20 year old female with a history of sickle cell anemia, HbSS presents for a 1 month follow up. She maintains that she has been doing well on current medication regimen.  [name] states that she has been followed [facility]. She states that she is currently followed every 3 months. She is taking hydroxyurea consistently. Patient is asplenic and takes a prophylactic antibiotic. She is currently not having pain. She typically takes Oxycodone for moderate to severe pain, but is out of medication. She also has a history of a vitamin D deficiency and has been taking weekly Sandra Mckay as prescribed. Patient denies headache, fever, shortness of breath, chest pain,vomiting, or diarrhea.    Past Medical History  Diagnosis Date  . Sickle cell anemia (HCC)   . Heart murmur   . Blood transfusion without reported diagnosis   . Depression   . Chickenpox   . Bronchitis   . Urinary tract infection    Immunization History  Administered Date(s) Administered  . Influenza,inj,Quad PF,36+ Mos 07/22/2015    Social History   Social History  . Marital Status: Single    Spouse Name: N/A  . Number of Children: N/A  . Years of Education: N/A   Occupational History  . Not on file.   Social History Main Topics  . Smoking status: Former Research scientist (life sciences)  . Smokeless tobacco: Not on file  . Alcohol Use: 0.0 oz/week    0 Standard drinks or equivalent per week     Comment: occ  . Drug Use: No  . Sexual Activity: Yes    Birth Control/ Protection: Implant   Other Topics Concern  . Not on file   Social History Narrative  No Known Allergies Review of Systems  Constitutional: Negative.   HENT: Negative.   Eyes: Negative.  Negative for photophobia and visual disturbance.  Respiratory: Negative.  Negative for wheezing and stridor.    Cardiovascular: Negative.   Endocrine: Negative.   Genitourinary: Negative for hematuria.  Musculoskeletal: Negative.   Skin: Negative.   Allergic/Immunologic: Negative.  Negative for immunocompromised state.  Neurological: Negative.   Hematological: Negative.   Psychiatric/Behavioral: Negative.  Negative for suicidal ideas and sleep disturbance.      Objective:   Physical Exam  Constitutional: She is oriented to person, place, and time. She appears well-developed and well-nourished.  HENT:  Head: Normocephalic and atraumatic.  Right Ear: External ear normal.  Left Ear: External ear normal.  Mouth/Throat: Oropharynx is clear and moist.  Eyes: Conjunctivae and EOM are normal. Pupils are equal, round, and reactive to light.  Neck: Normal range of motion. Neck supple.  Cardiovascular: Normal rate, normal heart sounds and intact distal pulses.   Pulmonary/Chest: Effort normal and breath sounds normal.  Abdominal: Soft. Bowel sounds are normal.  Musculoskeletal: Normal range of motion.  Neurological: She is alert and oriented to person, place, and time. She has normal reflexes.  Skin: Skin is warm and dry.  Psychiatric: She has a normal mood and affect. Her behavior is normal. Judgment and thought content normal.      BP 111/69 mmHg  Pulse 76  Temp(Src) 98.4 F (36.9 C) (Oral)  Resp 16  Ht 5\' 2"  (1.575 m)  Wt 128 lb (58.06 kg)  BMI 23.41 kg/m2  Assessment & Plan:  1. Hb-SS  disease without crisis ([place]) Sickle cell disease - Continue Hydrea 1800 mg daily. Will check baseline reticulocytes, hemoglobin, platelet count, and ANC. We discussed the need for good hydration, monitoring of hydration status, avoidance of heat, cold, stress, and infection triggers. We discussed the risks and benefits of Hydrea, including bone marrow suppression, the possibility of GI upset, skin ulcers, hair thinning, and teratogenicity. The patient was reminded of the need to seek medical attention of any  symptoms of bleeding, anemia, or infection. Continue folic acid 1 mg daily to prevent aplastic bone marrow crises. No proteinuria present.   Pulmonary evaluation - Patient denies severe recurrent wheezes, shortness of breath with exercise, or persistent cough. If these symptoms develop, pulmonary function tests with spirometry will be ordered, and if abnormal, plan on referral to Pulmonology for further evaluation.  Cardiac - Routine screening for pulmonary hypertension is not recommended.  Eye - High risk of proliferative retinopathy. Annual eye exam with retinal exam recommended to patient. Last eye appointment was in September. Dr. Shaune Spittle.   Immunization status - Patient will receive an influenza vaccination on today. Will review [facility].   Acute and chronic painful episodes - We agreed on Oxycodone 10 mg daily. We discussed that pt is to receive her Schedule II prescriptions only from [place]. Pt is also aware that the prescription history is available to [place] online through the [facility] CSRS. Controlled substance agreement signed today. We reminded Sandra Mckay that all patients receiving Schedule II narcotics must be seen for follow within one month of prescription being requested. We reviewed the terms of our pain agreement, including the need to keep medicines in a safe locked location away from children or pets, and the need to report excess sedation or constipation, measures to avoid constipation, and policies related to early refills and stolen prescriptions. According to the [place] [facility] program, we have reviewed details related to analgesia, adverse effects, aberrant behaviors.  - POCT urinalysis dipstick  2. Nausea without vomiting - ondansetron (ZOFRAN) 4 MG tablet; Take 1 tablet (4 mg total) by mouth every 8 (eight) hours as needed for nausea or vomiting.  Dispense: 20 tablet; Refill: 0  3. Abnormal finding on urinalysis - Urine culture  Routine Health Maintenance:   Recommend pap smear Recommend barrier protection with sexual intercourse Refrain from smoking or secondhand smoke   Dorena Dew, FNP

## 2015-09-10 LAB — URINE CULTURE: Colony Count: 3000

## 2015-09-14 ENCOUNTER — Other Ambulatory Visit: Payer: Self-pay | Admitting: Family Medicine

## 2015-10-04 ENCOUNTER — Telehealth: Payer: Self-pay | Admitting: *Deleted

## 2015-10-04 DIAGNOSIS — D571 Sickle-cell disease without crisis: Secondary | ICD-10-CM

## 2015-10-04 NOTE — Telephone Encounter (Signed)
Refill request for oxycodone. LOV 09/09/2015. Please advise. Thanks!

## 2015-10-04 NOTE — Telephone Encounter (Signed)
Pt called and needs a refill of her oxycodone. Please advise provider. Thanks

## 2015-10-07 MED ORDER — OXYCODONE HCL 10 MG PO TABS
10.0000 mg | ORAL_TABLET | Freq: Four times a day (QID) | ORAL | Status: DC | PRN
Start: 2015-10-07 — End: 2015-11-08

## 2015-10-07 NOTE — Telephone Encounter (Signed)
Reviewed [place] Substance Reporting system prior to prescribing opiate medications, no inconsistencies noted.   Meds ordered this encounter  Medications  . Oxycodone HCl 10 MG TABS    Sig: Take 1 tablet (10 mg total) by mouth every 6 (six) hours as needed. for pain    Dispense:  60 tablet    Refill:  0    Order Specific Question:  Supervising Provider    Answer:  Tresa Garter [id number]    Dorena Dew, FNP

## 2015-10-17 ENCOUNTER — Other Ambulatory Visit: Payer: Self-pay | Admitting: Family Medicine

## 2015-10-27 ENCOUNTER — Emergency Department (HOSPITAL_COMMUNITY)
Admission: EM | Admit: 2015-10-27 | Discharge: 2015-10-28 | Disposition: A | Discharge status: Home / Self Care | Payer: 59 | Attending: Emergency Medicine | Admitting: Emergency Medicine

## 2015-10-27 ENCOUNTER — Encounter (HOSPITAL_COMMUNITY): Payer: Self-pay | Admitting: Emergency Medicine

## 2015-10-27 DIAGNOSIS — Z8619 Personal history of other infectious and parasitic diseases: Secondary | ICD-10-CM | POA: Diagnosis not present

## 2015-10-27 DIAGNOSIS — Y9289 Other specified places as the place of occurrence of the external cause: Secondary | ICD-10-CM | POA: Diagnosis not present

## 2015-10-27 DIAGNOSIS — Z3202 Encounter for pregnancy test, result negative: Secondary | ICD-10-CM | POA: Diagnosis not present

## 2015-10-27 DIAGNOSIS — Z87440 Personal history of urinary (tract) infections: Secondary | ICD-10-CM | POA: Diagnosis not present

## 2015-10-27 DIAGNOSIS — Y998 Other external cause status: Secondary | ICD-10-CM | POA: Diagnosis not present

## 2015-10-27 DIAGNOSIS — IMO0001 Reserved for inherently not codable concepts without codable children: Secondary | ICD-10-CM

## 2015-10-27 DIAGNOSIS — Z8709 Personal history of other diseases of the respiratory system: Secondary | ICD-10-CM | POA: Insufficient documentation

## 2015-10-27 DIAGNOSIS — X58XXXA Exposure to other specified factors, initial encounter: Secondary | ICD-10-CM | POA: Insufficient documentation

## 2015-10-27 DIAGNOSIS — F329 Major depressive disorder, single episode, unspecified: Secondary | ICD-10-CM | POA: Insufficient documentation

## 2015-10-27 DIAGNOSIS — D5700 Hb-SS disease with crisis, unspecified: Secondary | ICD-10-CM | POA: Insufficient documentation

## 2015-10-27 DIAGNOSIS — R011 Cardiac murmur, unspecified: Secondary | ICD-10-CM | POA: Insufficient documentation

## 2015-10-27 DIAGNOSIS — T148 Other injury of unspecified body region: Secondary | ICD-10-CM | POA: Insufficient documentation

## 2015-10-27 DIAGNOSIS — Z79899 Other long term (current) drug therapy: Secondary | ICD-10-CM | POA: Insufficient documentation

## 2015-10-27 DIAGNOSIS — Z87891 Personal history of nicotine dependence: Secondary | ICD-10-CM | POA: Insufficient documentation

## 2015-10-27 DIAGNOSIS — Y9389 Activity, other specified: Secondary | ICD-10-CM | POA: Insufficient documentation

## 2015-10-27 LAB — COMPREHENSIVE METABOLIC PANEL
ALBUMIN: 4.6 g/dL (ref 3.5–5.0)
ALK PHOS: 61 U/L (ref 38–126)
ALT: 11 U/L — ABNORMAL LOW (ref 14–54)
ANION GAP: 8 (ref 5–15)
AST: 22 U/L (ref 15–41)
BILIRUBIN TOTAL: 3 mg/dL — AB (ref 0.3–1.2)
BUN: 6 mg/dL (ref 6–20)
CALCIUM: 9.5 mg/dL (ref 8.9–10.3)
CO2: 23 mmol/L (ref 22–32)
Chloride: 108 mmol/L (ref 101–111)
Creatinine, Ser: 0.55 mg/dL (ref 0.44–1.00)
GFR calc Af Amer: >60 mL/min (ref >60)
GFR calc non Af Amer: >60 mL/min (ref >60)
GLUCOSE: 88 mg/dL (ref 65–99)
POTASSIUM: 3.9 mmol/L (ref 3.5–5.1)
SODIUM: 139 mmol/L (ref 135–145)
TOTAL PROTEIN: 7 g/dL (ref 6.5–8.1)

## 2015-10-27 LAB — CBC WITH DIFFERENTIAL/PLATELET
BASOS PCT: 1 %
Basophils Absolute: 0.1 10*3/uL (ref 0.0–0.1)
EOS ABS: 0.1 10*3/uL (ref 0.0–0.7)
EOS PCT: 1 %
HCT: 21.3 % — ABNORMAL LOW (ref 36.0–46.0)
Hemoglobin: 7.8 g/dL — ABNORMAL LOW (ref 12.0–15.0)
LYMPHS PCT: 37 %
Lymphs Abs: 4 10*3/uL (ref 0.7–4.0)
MCH: 41.7 pg — ABNORMAL HIGH (ref 26.0–34.0)
MCHC: 36.6 g/dL — AB (ref 30.0–36.0)
MCV: 113.9 fL — ABNORMAL HIGH (ref 78.0–100.0)
MONO ABS: 2 10*3/uL — AB (ref 0.1–1.0)
MONOS PCT: 19 %
NEUTROS ABS: 4.7 10*3/uL (ref 1.7–7.7)
Neutrophils Relative %: 43 %
Platelets: 457 10*3/uL — ABNORMAL HIGH (ref 150–400)
RBC: 1.87 MIL/uL — ABNORMAL LOW (ref 3.87–5.11)
RDW: 18.8 % — AB (ref 11.5–15.5)
WBC: 10.8 10*3/uL — ABNORMAL HIGH (ref 4.0–10.5)

## 2015-10-27 LAB — RETICULOCYTES: RBC.: 1.91 MIL/uL — ABNORMAL LOW (ref 3.87–5.11)

## 2015-10-27 LAB — I-STAT BETA HCG BLOOD, ED (MC, WL, AP ONLY)

## 2015-10-27 MED ORDER — SODIUM CHLORIDE 0.9 % IV BOLUS (SEPSIS)
1000.0000 mL | Freq: Once | INTRAVENOUS | Status: AC
  Administered 2015-10-27: 1000 mL via INTRAVENOUS

## 2015-10-27 MED ORDER — HYDROMORPHONE HCL 1 MG/ML IJ SOLN
1.0000 mg | Freq: Once | INTRAMUSCULAR | Status: AC
  Administered 2015-10-27: 1 mg via INTRAVENOUS
  Filled 2015-10-27: qty 1

## 2015-10-27 MED ORDER — HYDROMORPHONE HCL 1 MG/ML IJ SOLN
1.0000 mg | Freq: Once | INTRAMUSCULAR | Status: AC
Start: 2015-10-27 — End: 2015-10-27
  Administered 2015-10-27: 1 mg via INTRAVENOUS
  Filled 2015-10-27: qty 1

## 2015-10-27 NOTE — ED Notes (Signed)
Pt states she is hurting in her wrists, head, lower back and her knees and lower legs. States it feels like a sickle cell crisis. Started earlier today. Did not take anything at home for it. Alert and oriented.

## 2015-10-27 NOTE — ED Provider Notes (Signed)
CSN: [id number]     Arrival date & time 10/27/15  1855 History   First MD Initiated Contact with Patient 10/27/15 1909     Chief Complaint  Patient presents with  . Sickle Cell Pain Crisis     (Consider location/radiation/quality/duration/timing/severity/associated sxs/prior Treatment) Patient is a 20 y.o. female presenting with sickle cell pain. The history is provided by the patient and medical records.  Sickle Cell Pain Crisis   20 year old female with history of sickle cell anemia, depression , presenting to the ED for sickle cell pain crisis. She reports this began earlier this morning and has been ongoing throughout the day today. She endorses pain of her neck, head, wrists, low back, knees, and lower legs. She states her pain is consistent with her typical  Sickle cell pain crisis. She denies any numbness or weakness. No new injuries or trauma. Patient has hydroxyurea, Motrin, and oxycodone at home but she did not take any of her home meds prior to coming to the ED today. She denies any fever or chills. No abdominal pain, chest pain, shortness of breath, nausea, vomiting, or diarrhea.   No dizziness, numbness, weakness , difficulty walking, changes in speech, visual disturbance , tinnitus, etc.No recent hospitalizations for anemia or pain control.  VSS.  Past Medical History  Diagnosis Date  . Sickle cell anemia (HCC)   . Heart murmur   . Blood transfusion without reported diagnosis   . Depression   . Chickenpox   . Bronchitis   . Urinary tract infection    Past Surgical History  Procedure Laterality Date  . Cholecystectomy  2011  . Tonsillectomy  2012  . 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 Labial adhesion lysis  1999  . Eye surgery      Sty removal   Family History  Problem Relation Age of Onset  . Arthritis Other     grandparent  . Stroke Other   . Hypertension Other   . Diabetes      grandparent  . Cancer - Other     Glioblastoma   Social History  Substance Use Topics  . Smoking status: Former Research scientist (life sciences)  . Smokeless tobacco: None  . Alcohol Use: 0.0 oz/week    0 Standard drinks or equivalent per week     Comment: occ   OB History    No data available     Review of Systems  Musculoskeletal: Positive for back pain and arthralgias.  All other systems reviewed and are negative.     Allergies  Review of patient's allergies indicates no known allergies.  Home Medications   Prior to Admission medications   Medication Sig Start Date End Date Taking? Authorizing Provider  DROXIA 300 MG capsule TAKE 1 CAPSULE (300 MG TOTAL) BY MOUTH DAILY. 10/17/15  Yes Dorena Dew, FNP  folic acid (FOLVITE) 1 MG tablet Take 1 tablet by mouth daily. 10/11/14  Yes Historical Provider, MD  hydroxyurea (HYDREA) 500 MG capsule TAKE 3 CAPSULES (1,500 MG TOTAL) BY MOUTH DAILY. 09/16/15  Yes Dorena Dew, FNP  ibuprofen (ADVIL,MOTRIN) 200 MG tablet Take 400 mg by mouth every 6 (six) hours as needed for moderate pain. Reported on 10/27/2015   Yes Historical Provider, MD  ibuprofen (ADVIL,MOTRIN) 600 MG tablet Take 1 tablet (600 mg total) by mouth every 8 (eight) hours as needed. Patient taking differently: Take 600 mg by mouth every 8 (eight) hours as needed for moderate pain.  07/22/15  Yes Dorena Dew, FNP  ondansetron (ZOFRAN) 4 MG tablet Take 1 tablet (4 mg total) by mouth every 8 (eight) hours as needed for nausea or vomiting. 09/09/15  Yes Dorena Dew, FNP  Oxcarbazepine (TRILEPTAL) 300 MG tablet Take 300 mg by mouth 2 (two) times daily. Reported on 10/27/2015   Yes Historical Provider, MD  Oxycodone HCl 10 MG TABS Take 1 tablet (10 mg total) by mouth every 6 (six) hours as needed. for pain 10/07/15  Yes Dorena Dew, FNP  Vitamin D, Ergocalciferol, (DRISDOL) 50000 units CAPS capsule Take 1 capsule (50,000 Units total) by mouth every 7 (seven) days. Saturday. Patient taking differently: Take 50,000 Units by  mouth every Saturday. Saturday. 08/27/15 08/26/16 Yes Dorena Dew, FNP  clindamycin (CLEOCIN) 300 MG capsule Take 1 capsule (300 mg total) by mouth 2 (two) times daily. Patient not taking: Reported on 08/24/2015 07/22/15   Dorena Dew, FNP   BP 116/65 mmHg  Pulse 99  Temp(Src) 98.4 F (36.9 C) (Oral)  Resp 19  SpO2 98%   Physical Exam  Constitutional: She is oriented to person, place, and time. She appears well-developed and well-nourished. No distress.  HENT:  Head: Normocephalic and atraumatic.  Mouth/Throat: Oropharynx is clear and moist.  Eyes: Conjunctivae and EOM are normal. Pupils are equal, round, and reactive to light.  Neck: Normal range of motion and full passive range of motion without pain. Neck supple. No spinous process tenderness and no muscular tenderness present. No rigidity.  No meningeal signs  Cardiovascular: Normal rate, regular rhythm and normal heart sounds.   Pulmonary/Chest: Effort normal and breath sounds normal. No respiratory distress. She has no wheezes.  Abdominal: Soft. Bowel sounds are normal. There is no tenderness. There is no guarding.  Musculoskeletal: Normal range of motion. She exhibits no edema.  Neurological: She is alert and oriented to person, place, and time.  AAOx3, answering questions appropriately; equal strength UE and LE bilaterally; CN grossly intact; moves all extremities appropriately without ataxia; no focal neuro deficits or facial asymmetry appreciated  Skin: Skin is warm and dry. She is not diaphoretic.  Psychiatric: She has a normal mood and affect.  Nursing note and vitals reviewed.   ED Course  Procedures (including critical care time) Labs Review Labs Reviewed  COMPREHENSIVE METABOLIC PANEL - Abnormal; Notable for the following:    ALT 11 (*)    Total Bilirubin 3.0 (*)    All other components within normal limits  CBC WITH DIFFERENTIAL/PLATELET - Abnormal; Notable for the following:    WBC 10.8 (*)    RBC 1.87  (*)    Hemoglobin 7.8 (*)    HCT 21.3 (*)    MCV 113.9 (*)    MCH 41.7 (*)    MCHC 36.6 (*)    RDW 18.8 (*)    Platelets 457 (*)    Monocytes Absolute 2.0 (*)    All other components within normal limits  RETICULOCYTES - Abnormal; Notable for the following:    Retic Ct Pct >23.0 (*)    RBC. 1.91 (*)    All other components within normal limits  I-STAT BETA HCG BLOOD, ED ([facility], [facility], [facility] ONLY)    Imaging Review No results found. I have personally reviewed and evaluated these images and lab results as part of my medical decision-making.   EKG Interpretation None      MDM   Final diagnoses:  Sickle cell pain crisis ([place])   20 year old female  here with sickle cell pain crisis. Reports pain of multiple areas including wrists, head, low back, knees, and lower legs. States pain is typical of sickle cell pain crisis. No neurologic deficits. No clinical signs of meningitis. No chest pain, shortness of breath, fever, or cough. Do not suspect acute chest syndrome. Labwork is reassuring but clinically consistent with crisis.   We'll and for pain control.   Patient given Dilaudid, IV fluids.  11:54 PM Patient currently sleeping after second dose of medications.  She is in NAD.  Will discharge home.  Recommend to take home pain meds, follow-up with PCP.  Discussed plan with patient, he/she acknowledged understanding and agreed with plan of care.  Return precautions given for new or worsening symptoms.  Larene Pickett, PA-C 10/28/15 0106  Lacretia Leigh, MD 10/31/15 [phone]

## 2015-10-28 MED ORDER — HYDROMORPHONE HCL 1 MG/ML IJ SOLN
1.0000 mg | Freq: Once | INTRAMUSCULAR | Status: AC
  Administered 2015-10-28: 1 mg via INTRAVENOUS
  Filled 2015-10-28: qty 1

## 2015-10-28 NOTE — Discharge Instructions (Signed)
Take your home pain medications as directed. Follow-up with your primary care physician. Return to the ED for new or worsening symptoms.

## 2015-11-05 ENCOUNTER — Other Ambulatory Visit: Payer: Self-pay | Admitting: *Deleted

## 2015-11-05 ENCOUNTER — Telehealth: Payer: Self-pay

## 2015-11-05 DIAGNOSIS — D571 Sickle-cell disease without crisis: Secondary | ICD-10-CM

## 2015-11-05 MED ORDER — FOLIC ACID 1 MG PO TABS: 1.0000 mg | ORAL_TABLET | Freq: Every day | ORAL | Status: DC

## 2015-11-05 NOTE — Telephone Encounter (Signed)
Message was routed to provider for approval

## 2015-11-05 NOTE — Telephone Encounter (Signed)
Refill request for percocet, LOV 09/09/2015. Please advise. Thanks!

## 2015-11-05 NOTE — Telephone Encounter (Signed)
Pt called requesting medication refills on Folic Acid and Oxycodone. Thanks!

## 2015-11-05 NOTE — Telephone Encounter (Signed)
Patient was last seen on 09/09/15. Patient is requesting a refill on Folic Acid which was prescribed by a historical provider on 10/11/14. Patient is also requesting a refill on Oxycodone which was last filled on 10/07/15.

## 2015-11-08 MED ORDER — OXYCODONE HCL 10 MG PO TABS: 10.0000 mg | ORAL_TABLET | Freq: Four times a day (QID) | ORAL | Status: DC | PRN

## 2015-11-08 NOTE — Telephone Encounter (Signed)
Reviewed [place] Substance Reporting system prior to prescribing opiate medications, no inconsistencies noted.   Meds ordered this encounter  Medications  . folic acid (FOLVITE) 1 MG tablet    Sig: Take 1 tablet (1 mg total) by mouth daily.    Dispense:  30 tablet    Refill:  3  . Oxycodone HCl 10 MG TABS    Sig: Take 1 tablet (10 mg total) by mouth every 6 (six) hours as needed. for pain    Dispense:  60 tablet    Refill:  0    Order Specific Question:  Supervising Provider    Answer:  Tresa Garter [id number]    Dorena Dew, FNP

## 2015-11-10 ENCOUNTER — Other Ambulatory Visit: Payer: Self-pay | Admitting: Family Medicine

## 2015-12-10 ENCOUNTER — Ambulatory Visit: Payer: 59 | Admitting: Internal Medicine

## 2015-12-13 ENCOUNTER — Telehealth: Payer: Self-pay

## 2015-12-13 NOTE — Telephone Encounter (Signed)
Pt is requesting a medication refill for Oxycodone. Thanks!

## 2015-12-13 NOTE — Telephone Encounter (Signed)
Refill request for oxycodone. LOV 09/09/2015. Please advise. Thanks!

## 2015-12-17 ENCOUNTER — Encounter: Payer: Self-pay | Admitting: Internal Medicine

## 2015-12-17 ENCOUNTER — Ambulatory Visit (INDEPENDENT_AMBULATORY_CARE_PROVIDER_SITE_OTHER): Payer: 59 | Admitting: Internal Medicine

## 2015-12-17 VITALS — Ht 63.0 in | Wt 127.0 lb

## 2015-12-17 DIAGNOSIS — D571 Sickle-cell disease without crisis: Secondary | ICD-10-CM

## 2015-12-17 MED ORDER — IBUPROFEN 600 MG PO TABS: 600.0000 mg | ORAL_TABLET | Freq: Three times a day (TID) | ORAL | Status: DC | PRN

## 2015-12-17 MED ORDER — OXYCODONE HCL 10 MG PO TABS: 10.0000 mg | ORAL_TABLET | Freq: Four times a day (QID) | ORAL | Status: DC | PRN

## 2015-12-17 MED ORDER — FOLIC ACID 1 MG PO TABS: 1.0000 mg | ORAL_TABLET | Freq: Every day | ORAL | Status: DC

## 2015-12-17 NOTE — Patient Instructions (Signed)
Sickle Cell Anemia, Adult Sickle cell anemia is a condition in which red blood cells have an abnormal "sickle" shape. This abnormal shape shortens the cells' life span, which results in a lower than normal concentration of red blood cells in the blood. The sickle shape also causes the cells to clump together and block free blood flow through the blood vessels. As a result, the tissues and organs of the body do not receive enough oxygen. Sickle cell anemia causes organ damage and pain and increases the risk of infection. CAUSES  Sickle cell anemia is a genetic disorder. Those who receive two copies of the gene have the condition, and those who receive one copy have the trait. RISK FACTORS The sickle cell gene is most common in people whose families originated in [place]. Other areas of the globe where sickle cell trait occurs include the Mediterranean, [place] and [place], the [place], and the [place].  SIGNS AND SYMPTOMS  Pain, especially in the extremities, back, chest, or abdomen (common). The pain may start suddenly or may develop following an illness, especially if there is dehydration. Pain can also occur due to overexertion or exposure to extreme temperature changes.  Frequent severe bacterial infections, especially certain types of pneumonia and meningitis.  Pain and swelling in the hands and feet.  Decreased activity.   Loss of appetite.   Change in behavior.  Headaches.  Seizures.  Shortness of breath or difficulty breathing.  Vision changes.  Skin ulcers. Those with the trait may not have symptoms or they may have mild symptoms.  DIAGNOSIS  Sickle cell anemia is diagnosed with blood tests that demonstrate the genetic trait. It is often diagnosed during the newborn period, due to mandatory testing nationwide. A variety of blood tests, X-rays, CT scans, MRI scans, ultrasounds, and lung function tests may also be done to monitor the condition. TREATMENT  Sickle  cell anemia may be treated with:  Medicines. You may be given pain medicines, antibiotic medicines (to treat and prevent infections) or medicines to increase the production of certain types of hemoglobin.  Fluids.  Oxygen.  Blood transfusions. HOME CARE INSTRUCTIONS   Drink enough fluid to keep your urine clear or pale yellow. Increase your fluid intake in hot weather and during exercise.  Do not smoke. Smoking lowers oxygen levels in the blood.   Only take over-the-counter or prescription medicines for pain, fever, or discomfort as directed by your health care provider.  Take antibiotics as directed by your health care provider. Make sure you finish them it even if you start to feel better.   Take supplements as directed by your health care provider.   Consider wearing a medical alert bracelet. This tells anyone caring for you in an emergency of your condition.   When traveling, keep your medical information, health care provider's names, and the medicines you take with you at all times.   If you develop a fever, do not take medicines to reduce the fever right away. This could cover up a problem that is developing. Notify your health care provider.  Keep all follow-up appointments with your health care provider. Sickle cell anemia requires regular medical care. SEEK MEDICAL CARE IF: You have a fever. SEEK IMMEDIATE MEDICAL CARE IF:   You feel dizzy or faint.   You have new abdominal pain, especially on the left side near the stomach area.   You develop a persistent, often uncomfortable and painful penile erection (priapism). If this is not treated immediately it   will lead to impotence.   You have numbness your arms or legs or you have a hard time moving them.   You have a hard time with speech.   You have a fever or persistent symptoms for more than 2-3 days.   You have a fever and your symptoms suddenly get worse.   You have signs or symptoms of infection.  These include:   Chills.   Abnormal tiredness (lethargy).   Irritability.   Poor eating.   Vomiting.   You develop pain that is not helped with medicine.   You develop shortness of breath.  You have pain in your chest.   You are coughing up pus-like or bloody sputum.   You develop a stiff neck.  Your feet or hands swell or have pain.  Your abdomen appears bloated.  You develop joint pain. MAKE SURE YOU:  Understand these instructions.   This information is not intended to replace advice given to you by your health care provider. Make sure you discuss any questions you have with your health care provider.   Document Released: 10/28/2005 Document Revised: 08/10/2014 Document Reviewed: 03/01/2013 Elsevier Interactive Patient Education 2016 Elsevier Inc.  
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
## 2015-12-17 NOTE — Progress Notes (Signed)
Patient is here for medication refill.  Patient complains of upper back pain being present today. Currently scaled at a 5.   Patient is upset due to relationship concerns. Patient denies any suicidal ideations today.

## 2016-01-21 ENCOUNTER — Emergency Department (HOSPITAL_COMMUNITY)
Admission: EM | Admit: 2016-01-21 | Discharge: 2016-01-21 | Disposition: A | Discharge status: Home / Self Care | Payer: 59 | Attending: Emergency Medicine | Admitting: Emergency Medicine

## 2016-01-21 ENCOUNTER — Encounter (HOSPITAL_COMMUNITY): Payer: Self-pay

## 2016-01-21 ENCOUNTER — Telehealth: Payer: Self-pay | Admitting: Hematology

## 2016-01-21 ENCOUNTER — Emergency Department (HOSPITAL_COMMUNITY): Payer: 59

## 2016-01-21 DIAGNOSIS — F329 Major depressive disorder, single episode, unspecified: Secondary | ICD-10-CM | POA: Insufficient documentation

## 2016-01-21 DIAGNOSIS — D5700 Hb-SS disease with crisis, unspecified: Secondary | ICD-10-CM | POA: Diagnosis present

## 2016-01-21 DIAGNOSIS — Z87891 Personal history of nicotine dependence: Secondary | ICD-10-CM | POA: Insufficient documentation

## 2016-01-21 DIAGNOSIS — R112 Nausea with vomiting, unspecified: Secondary | ICD-10-CM | POA: Diagnosis not present

## 2016-01-21 LAB — CBC WITH DIFFERENTIAL/PLATELET
BASOS ABS: 0.1 10*3/uL (ref 0.0–0.1)
Basophils Relative: 1 %
EOS ABS: 0 10*3/uL (ref 0.0–0.7)
Eosinophils Relative: 0 %
HCT: 19.1 % — ABNORMAL LOW (ref 36.0–46.0)
Hemoglobin: 7 g/dL — ABNORMAL LOW (ref 12.0–15.0)
LYMPHS ABS: 4 10*3/uL (ref 0.7–4.0)
Lymphocytes Relative: 37 %
MCH: 38.5 pg — ABNORMAL HIGH (ref 26.0–34.0)
MCHC: 36.6 g/dL — AB (ref 30.0–36.0)
MCV: 104.9 fL — ABNORMAL HIGH (ref 78.0–100.0)
MONO ABS: 1.7 10*3/uL — AB (ref 0.1–1.0)
MONOS PCT: 16 %
NEUTROS ABS: 5 10*3/uL (ref 1.7–7.7)
Neutrophils Relative %: 46 %
PLATELETS: 468 10*3/uL — AB (ref 150–400)
RBC: 1.82 MIL/uL — AB (ref 3.87–5.11)
RDW: 19.6 % — AB (ref 11.5–15.5)
WBC: 10.8 10*3/uL — AB (ref 4.0–10.5)

## 2016-01-21 LAB — COMPREHENSIVE METABOLIC PANEL
ALK PHOS: 54 U/L (ref 38–126)
ALT: 18 U/L (ref 14–54)
AST: 36 U/L (ref 15–41)
Albumin: 5.1 g/dL — ABNORMAL HIGH (ref 3.5–5.0)
Anion gap: 9 (ref 5–15)
BILIRUBIN TOTAL: 3.7 mg/dL — AB (ref 0.3–1.2)
BUN: 6 mg/dL (ref 6–20)
CALCIUM: 9.8 mg/dL (ref 8.9–10.3)
CO2: 20 mmol/L — ABNORMAL LOW (ref 22–32)
CREATININE: 0.37 mg/dL — AB (ref 0.44–1.00)
Chloride: 107 mmol/L (ref 101–111)
GFR calc Af Amer: >60 mL/min (ref >60)
Glucose, Bld: 83 mg/dL (ref 65–99)
POTASSIUM: 3.9 mmol/L (ref 3.5–5.1)
Sodium: 136 mmol/L (ref 135–145)
TOTAL PROTEIN: 7.3 g/dL (ref 6.5–8.1)

## 2016-01-21 LAB — URINALYSIS, ROUTINE W REFLEX MICROSCOPIC
Glucose, UA: NEGATIVE mg/dL
KETONES UR: NEGATIVE mg/dL
LEUKOCYTES UA: NEGATIVE
NITRITE: NEGATIVE
PROTEIN: NEGATIVE mg/dL
Specific Gravity, Urine: 1.015 (ref 1.005–1.030)
pH: 6 (ref 5.0–8.0)

## 2016-01-21 LAB — I-STAT BETA HCG BLOOD, ED (MC, WL, AP ONLY): I-stat hCG, quantitative: <5 m[IU]/mL (ref <5)

## 2016-01-21 LAB — URINE MICROSCOPIC-ADD ON

## 2016-01-21 LAB — RETICULOCYTES: Retic Ct Pct: >23 % — ABNORMAL HIGH (ref 0.4–3.1)

## 2016-01-21 MED ORDER — HYDROMORPHONE HCL 2 MG/ML IJ SOLN: 0.0375 mg/kg | INTRAMUSCULAR | Status: AC

## 2016-01-21 MED ORDER — HYDROMORPHONE HCL 2 MG/ML IJ SOLN
0.0250 mg/kg | INTRAMUSCULAR | Status: AC
  Administered 2016-01-21: 1.4 mg via INTRAVENOUS

## 2016-01-21 MED ORDER — HYDROMORPHONE HCL 2 MG/ML IJ SOLN
0.0313 mg/kg | INTRAMUSCULAR | Status: AC
  Administered 2016-01-21: 1.8 mg via INTRAVENOUS
  Filled 2016-01-21: qty 1

## 2016-01-21 MED ORDER — ONDANSETRON HCL 4 MG/2ML IJ SOLN
4.0000 mg | INTRAMUSCULAR | Status: DC | PRN
  Administered 2016-01-21: 4 mg via INTRAVENOUS
  Filled 2016-01-21: qty 2

## 2016-01-21 MED ORDER — SODIUM CHLORIDE 0.45 % IV SOLN
INTRAVENOUS | Status: DC
  Administered 2016-01-21: 18:00:00 via INTRAVENOUS

## 2016-01-21 MED ORDER — FAMOTIDINE IN NACL 20-0.9 MG/50ML-% IV SOLN
20.0000 mg | Freq: Once | INTRAVENOUS | Status: AC
  Administered 2016-01-21: 20 mg via INTRAVENOUS
  Filled 2016-01-21: qty 50

## 2016-01-21 MED ORDER — HYDROMORPHONE HCL 2 MG/ML IJ SOLN
0.0250 mg/kg | INTRAMUSCULAR | Status: AC
  Filled 2016-01-21: qty 1

## 2016-01-21 MED ORDER — HYDROMORPHONE HCL 2 MG/ML IJ SOLN: 0.0313 mg/kg | INTRAMUSCULAR | Status: AC

## 2016-01-21 MED ORDER — ONDANSETRON 4 MG PO TBDP: 4.0000 mg | ORAL_TABLET | Freq: Three times a day (TID) | ORAL | Status: DC | PRN

## 2016-01-21 MED ORDER — HYDROMORPHONE HCL 2 MG/ML IJ SOLN
0.0375 mg/kg | INTRAMUSCULAR | Status: AC
  Administered 2016-01-21: 2.1 mg via INTRAVENOUS
  Filled 2016-01-21: qty 2

## 2016-01-21 MED ORDER — PANTOPRAZOLE SODIUM 20 MG PO TBEC: 20.0000 mg | DELAYED_RELEASE_TABLET | Freq: Every day | ORAL | Status: DC

## 2016-01-21 NOTE — ED Provider Notes (Signed)
CSN: [id number]     Arrival date & time 01/21/16  1421 History   First MD Initiated Contact with Patient 01/21/16 1711     Chief Complaint  Patient presents with  . Sickle Cell Pain Crisis  . Emesis     (Consider location/radiation/quality/duration/timing/severity/associated sxs/prior Treatment) HPI   Sandra Mckay is a 20 y.o F with a pmhx of sickle cell anemia Who presents to the ED today complaining of abdominal pain, vomiting and body aches. Patient states that for the last 2 days she has felt an achy pain in her epigastrium. Patient states "it feels like I ate something really big". Patient reports associated vomiting. Patient states that she has been having issues with nausea and vomiting ongoing for several years. Typically this makes her abdominal pain feel better but since yesterday she has felt no improvement with vomiting. Patient states that today she developed diffuse body aches in her back, bilateral upper and lower extremities that feels similar to her sickle cell pain. Patient states she thinks she is feeling his body aches because her body is tired from vomiting. She tried taking ibuprofen and Reglan without relief of her symptoms. Last BM was yesterday and was normal in color and caliber. No reported diarrhea, melena or hematochezia. Patient also states that she is expressing a chronic dry cough for the last 6 or 7 months. Patient denies fever, chest pain, shortness of breath, dysuria, hematemesis. No history of PE, DVT, acute chest syndrome. Last menstrual period was a week ago. Patient is on the Nexplanon for contraception.  Past Medical History  Diagnosis Date  . Sickle cell anemia (HCC)   . Heart murmur   . Blood transfusion without reported diagnosis   . Depression   . Chickenpox   . Bronchitis   . Urinary tract infection    Past Surgical History  Procedure Laterality Date  . Cholecystectomy  2011  . Tonsillectomy  2012  . 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 Labial adhesion lysis  1999  . Eye surgery      Sty removal   Family History  Problem Relation Age of Onset  . Arthritis Other     grandparent  . Stroke Other   . Hypertension Other   . Diabetes      grandparent  . Cancer - Other      Glioblastoma   Social History  Substance Use Topics  . Smoking status: Former Research scientist (life sciences)  . Smokeless tobacco: None  . Alcohol Use: 0.0 oz/week    0 Standard drinks or equivalent per week     Comment: occ   OB History    No data available     Review of Systems  All other systems reviewed and are negative.     Allergies  Review of patient's allergies indicates no known allergies.  Home Medications   Prior to Admission medications   Medication Sig Start Date End Date Taking? Authorizing Provider  metoCLOPramide (REGLAN) 10 MG tablet Take 10 mg by mouth every 6 (six) hours as needed for nausea or vomiting.   Yes Historical Provider, MD  Oxcarbazepine (TRILEPTAL) 300 MG tablet Take 300 mg by mouth 2 (two) times daily. Reported on 10/27/2015   Yes Historical Provider, MD  Oxycodone HCl 10 MG TABS Take 1 tablet (10 mg total) by mouth every 6 (six) hours as needed. 12/17/15  Yes Tresa Garter, MD  clindamycin (CLEOCIN) 300 MG capsule Take  1 capsule (300 mg total) by mouth 2 (two) times daily. Patient not taking: Reported on 08/24/2015 07/22/15   Dorena Dew, FNP  DROXIA 300 MG capsule TAKE 1 CAPSULE (300 MG TOTAL) BY MOUTH DAILY. 10/17/15   Dorena Dew, FNP  folic acid (FOLVITE) 1 MG tablet Take 1 tablet (1 mg total) by mouth daily. 12/17/15   Tresa Garter, MD  hydroxyurea (HYDREA) 500 MG capsule TAKE 3 CAPSULES (1,500 MG TOTAL) BY MOUTH DAILY. 11/11/15   Dorena Dew, FNP  ibuprofen (ADVIL,MOTRIN) 600 MG tablet Take 1 tablet (600 mg total) by mouth every 8 (eight) hours as needed for moderate pain. 12/17/15   Tresa Garter, MD  ondansetron (ZOFRAN) 4 MG tablet Take 1 tablet (4 mg  total) by mouth every 8 (eight) hours as needed for nausea or vomiting. 09/09/15   Dorena Dew, FNP  Vitamin D, Ergocalciferol, (DRISDOL) 50000 units CAPS capsule Take 1 capsule (50,000 Units total) by mouth every 7 (seven) days. Saturday. Patient taking differently: Take 50,000 Units by mouth every Saturday. Saturday. 08/27/15 08/26/16  Dorena Dew, FNP   BP 112/73 mmHg  Pulse 74  Temp(Src) 98.7 F (37.1 C) (Oral)  Resp 16  Wt 56.7 kg  SpO2 96% Physical Exam  Constitutional: She is oriented to person, place, and time. She appears well-developed and well-nourished. No distress.  HENT:  Head: Normocephalic and atraumatic.  Mouth/Throat: No oropharyngeal exudate.  Eyes: Conjunctivae and EOM are normal. Pupils are equal, round, and reactive to light. Right eye exhibits no discharge. Left eye exhibits no discharge. No scleral icterus.  Cardiovascular: Normal rate, regular rhythm, normal heart sounds and intact distal pulses.  Exam reveals no gallop and no friction rub.   No murmur heard. Pulmonary/Chest: Effort normal and breath sounds normal. No respiratory distress. She has no wheezes. She has no rales. She exhibits no tenderness.  Abdominal: Soft. She exhibits no distension. There is tenderness ( mild epigastric TTP). There is no guarding.  Musculoskeletal: Normal range of motion. She exhibits no edema.  Neurological: She is alert and oriented to person, place, and time. No cranial nerve deficit.  Strength 5/5 throughout. No sensory deficits.    Skin: Skin is warm and dry. No rash noted. She is not diaphoretic. No erythema. No pallor.  Psychiatric: She has a normal mood and affect. Her behavior is normal.  Nursing note and vitals reviewed.   ED Course  Procedures (including critical care time) Labs Review Labs Reviewed  URINALYSIS, ROUTINE W REFLEX MICROSCOPIC (NOT AT [facility]) - Abnormal; Notable for the following:    Color, Urine AMBER (*)    APPearance HAZY (*)    Hgb urine  dipstick TRACE (*)    Bilirubin Urine SMALL (*)    All other components within normal limits  URINE MICROSCOPIC-ADD ON - Abnormal; Notable for the following:    Squamous Epithelial / LPF 0-5 (*)    Bacteria, UA FEW (*)    All other components within normal limits  COMPREHENSIVE METABOLIC PANEL  CBC WITH DIFFERENTIAL/PLATELET  RETICULOCYTES  I-STAT BETA HCG BLOOD, ED ([facility], [facility], [facility] ONLY)    Imaging Review Dg Chest 2 View  01/21/2016  CLINICAL DATA:  Pt has sickle cell. Pt states body aches and n/v since yesterday. No fever. No change in urination. Abdominal pain. Ex-smoker EXAM: CHEST - 2 VIEW COMPARISON:  none FINDINGS: Lungs are clear. Heart size upper limits normal. No effusion. Visualized bones unremarkable. IMPRESSION: No acute cardiopulmonary disease.  Electronically Signed   By: Lucrezia Europe M.D.   On: 01/21/2016 18:39   I have personally reviewed and evaluated these images and lab results as part of my medical decision-making.   EKG Interpretation None      MDM   Final diagnoses:  Hb-SS disease with crisis (HCC)  Non-intractable vomiting with nausea, vomiting of unspecified type   20 y.o F with a pmhx of sickle cell anemia who presents to the ED c/o epigastric abd pain onset 2 days ago worsened with eating. Pt reports associated vomiting, but states that the vomiting has been an ongoing problem for the last several months and is usually post tussive. She has been having a dry cough ongoing for several months as well. Pt now having diffuse body aches that feel similar to her sickle cell pain. No CP or SOB. Pt appears well in the ED, in NAD. Non-toxic, non-septic. No hypoxia or tachycardia. Afebrile. Abd is soft and non-tender. Pain managed in ED with significant symptomatic relief. No episodes of emesis while in the ED. CXR unremarkable. Hgb low at 7, this appears to be pt base line when compared to previous results. i also discussed this lab value with pt and her mother who states that  her Hgb is usually from 7-8. She has had previous blood transfusion but usually not until pt Hgb is around 6. They do not want transfusion today.  Fluids given.  Labs, imaging and vitals reviewed.  Patient does not meet the SIRS or Sepsis criteria.  On repeat exam patient does not have a surgical abdomin and there are no peritoneal signs.  No indication of appendicitis, bowel obstruction, bowel perforation, cholecystitis, diverticulitis, PID or ectopic pregnancy. Symptoms may possible be related to GERD given chronic cough resulting in vomiting. Will try PPI and have pt follow up with PCP. Discussed treatment plan with pt and mother who are agreeable.  I have also discussed reasons to return immediately to the ER. Return precautions outlined in patient discharge instructions.          Dondra Spry [place], PA-C 01/22/16 [id number]  Merrily Pew, MD 01/23/16 [phone]

## 2016-01-21 NOTE — Progress Notes (Signed)
Entered in d/c instructions   [place] on 03/18/2016 You have a 3 month follow up appointment with [name] at 10 am on 03/18/16  [place] [phone]

## 2016-01-21 NOTE — Discharge Instructions (Signed)
Sickle Cell Anemia, Adult °Sickle cell anemia is a condition in which red blood cells have an abnormal "sickle" shape. This abnormal shape shortens the cells' life span, which results in a lower than normal concentration of red blood cells in the blood. The sickle shape also causes the cells to clump together and block free blood flow through the blood vessels. As a result, the tissues and organs of the body do not receive enough oxygen. Sickle cell anemia causes organ damage and pain and increases the risk of infection. °CAUSES  °Sickle cell anemia is a genetic disorder. Those who receive two copies of the gene have the condition, and those who receive one copy have the trait. °RISK FACTORS °The sickle cell gene is most common in people whose families originated in [place]. Other areas of the globe where sickle cell trait occurs include the Mediterranean, [place] and [place], the [place], and the [place].  °SIGNS AND SYMPTOMS °· Pain, especially in the extremities, back, chest, or abdomen (common). The pain may start suddenly or may develop following an illness, especially if there is dehydration. Pain can also occur due to overexertion or exposure to extreme temperature changes. °· Frequent severe bacterial infections, especially certain types of pneumonia and meningitis. °· Pain and swelling in the hands and feet. °· Decreased activity.   °· Loss of appetite.   °· Change in behavior. °· Headaches. °· Seizures. °· Shortness of breath or difficulty breathing. °· Vision changes. °· Skin ulcers. °Those with the trait may not have symptoms or they may have mild symptoms.  °DIAGNOSIS  °Sickle cell anemia is diagnosed with blood tests that demonstrate the genetic trait. It is often diagnosed during the newborn period, due to mandatory testing nationwide. A variety of blood tests, X-rays, CT scans, MRI scans, ultrasounds, and lung function tests may also be done to monitor the condition. °TREATMENT  °Sickle  cell anemia may be treated with: °· Medicines. You may be given pain medicines, antibiotic medicines (to treat and prevent infections) or medicines to increase the production of certain types of hemoglobin. °· Fluids. °· Oxygen. °· Blood transfusions. °HOME CARE INSTRUCTIONS  °· Drink enough fluid to keep your urine clear or pale yellow. Increase your fluid intake in hot weather and during exercise. °· Do not smoke. Smoking lowers oxygen levels in the blood.   °· Only take over-the-counter or prescription medicines for pain, fever, or discomfort as directed by your health care provider. °· Take antibiotics as directed by your health care provider. Make sure you finish them it even if you start to feel better.   °· Take supplements as directed by your health care provider.   °· Consider wearing a medical alert bracelet. This tells anyone caring for you in an emergency of your condition.   °· When traveling, keep your medical information, health care provider's names, and the medicines you take with you at all times.   °· If you develop a fever, do not take medicines to reduce the fever right away. This could cover up a problem that is developing. Notify your health care provider. °· Keep all follow-up appointments with your health care provider. Sickle cell anemia requires regular medical care. °SEEK MEDICAL CARE IF: ° You have a fever. °SEEK IMMEDIATE MEDICAL CARE IF:  °· You feel dizzy or faint.   °· You have new abdominal pain, especially on the left side near the stomach area.   °· You develop a persistent, often uncomfortable and painful penile erection (priapism). If this is not treated immediately it   will lead to impotence.   You have numbness your arms or legs or you have a hard time moving them.   You have a hard time with speech.   You have a fever or persistent symptoms for more than 2-3 days.   You have a fever and your symptoms suddenly get worse.   You have signs or symptoms of infection.  These include:   Chills.   Abnormal tiredness (lethargy).   Irritability.   Poor eating.   Vomiting.   You develop pain that is not helped with medicine.   You develop shortness of breath.  You have pain in your chest.   You are coughing up pus-like or bloody sputum.   You develop a stiff neck.  Your feet or hands swell or have pain.  Your abdomen appears bloated.  You develop joint pain. MAKE SURE YOU:  Understand these instructions.   This information is not intended to replace advice given to you by your health care provider. Make sure you discuss any questions you have with your health care provider.   Take Protonix daily for acid reflux. This may also help with your chronic cough. Follow up with your primary care doctor for re-evaluation and to have your blood counts checked. Return to the ED if you experience severe worsening of your symptoms, blood in vomit, fevers, chest pain, difficulty breathing.

## 2016-01-21 NOTE — Telephone Encounter (Signed)
Patient's mother is calling stating patient is experiencing pain in the top of her stomach and feels like a knot.  Patient also C/O vomiting with a little blood. Advised I would talk to provider and give her a call back. Mother verbalizes understanding

## 2016-01-21 NOTE — ED Notes (Signed)
Pt has sickle cell. Pt states body aches and n/v since yesterday.  No fever.  No change in urination.  Abdominal pain.

## 2016-01-21 NOTE — Telephone Encounter (Signed)
Advised mother that after speaking with Dr. Doreene Burke, the patient should be evaluated in the emergency department.  Mother verbalizes understanding.

## 2016-01-23 ENCOUNTER — Telehealth: Payer: Self-pay

## 2016-01-23 NOTE — Telephone Encounter (Signed)
Pt is requesting a medication refill for Oxycodone, 10mg. Thanks! 

## 2016-01-23 NOTE — Telephone Encounter (Signed)
Refill request for oxycodone 10mg . LOV 12/17/2015. Please advise. Thanks!

## 2016-01-24 ENCOUNTER — Other Ambulatory Visit: Payer: Self-pay | Admitting: Internal Medicine

## 2016-01-24 DIAGNOSIS — D571 Sickle-cell disease without crisis: Secondary | ICD-10-CM

## 2016-01-24 MED ORDER — OXYCODONE HCL 10 MG PO TABS: 10.0000 mg | ORAL_TABLET | Freq: Four times a day (QID) | ORAL | Status: DC | PRN

## 2016-02-18 ENCOUNTER — Other Ambulatory Visit: Payer: Self-pay | Admitting: Family Medicine

## 2016-02-21 ENCOUNTER — Other Ambulatory Visit: Payer: Self-pay | Admitting: Internal Medicine

## 2016-02-21 ENCOUNTER — Telehealth: Payer: Self-pay

## 2016-02-21 DIAGNOSIS — D571 Sickle-cell disease without crisis: Secondary | ICD-10-CM

## 2016-02-21 MED ORDER — OXYCODONE HCL 10 MG PO TABS: 10.0000 mg | ORAL_TABLET | Freq: Four times a day (QID) | ORAL | Status: DC | PRN

## 2016-02-21 NOTE — Telephone Encounter (Signed)
Refill request for oxycodone 10mg . LOV 12/17/2015. Please advise. Thanks!

## 2016-02-21 NOTE — Telephone Encounter (Signed)
Pt is requesting a medication refill for Oxycodone, 10mg. Thanks! 

## 2016-03-09 DIAGNOSIS — D5700 Hb-SS disease with crisis, unspecified: Secondary | ICD-10-CM | POA: Insufficient documentation

## 2016-03-18 ENCOUNTER — Ambulatory Visit: Payer: 59 | Admitting: Family Medicine

## 2016-03-26 ENCOUNTER — Other Ambulatory Visit: Payer: Self-pay | Admitting: Family Medicine

## 2016-03-26 ENCOUNTER — Telehealth: Payer: Self-pay | Admitting: *Deleted

## 2016-03-26 DIAGNOSIS — D571 Sickle-cell disease without crisis: Secondary | ICD-10-CM

## 2016-03-26 MED ORDER — OXYCODONE HCL 10 MG PO TABS: 10.0000 mg | ORAL_TABLET | Freq: Four times a day (QID) | ORAL | 0 refills | Status: DC | PRN

## 2016-03-26 NOTE — Telephone Encounter (Signed)
Please advise 

## 2016-03-26 NOTE — Telephone Encounter (Signed)
Patient called and states she needs a refill of her Oxycodone. Please advise provider. Thanks

## 2016-05-04 ENCOUNTER — Telehealth: Payer: Self-pay

## 2016-05-04 NOTE — Telephone Encounter (Signed)
Refill request for oxycodone 10mg . LOV 12/17/2015. Please advise. Thanks!

## 2016-05-05 ENCOUNTER — Other Ambulatory Visit: Payer: Self-pay | Admitting: Internal Medicine

## 2016-05-05 DIAGNOSIS — D571 Sickle-cell disease without crisis: Secondary | ICD-10-CM

## 2016-05-05 MED ORDER — OXYCODONE HCL 10 MG PO TABS: 10.0000 mg | ORAL_TABLET | Freq: Four times a day (QID) | ORAL | 0 refills | Status: DC | PRN

## 2016-05-28 ENCOUNTER — Telehealth: Payer: Self-pay | Admitting: Internal Medicine

## 2016-05-28 NOTE — Telephone Encounter (Signed)
Hi Bobbie, Please schedule for a lab visit for CBCD few days before her appointment and we will decide if BT is needed or not. Thank you

## 2016-05-28 NOTE — Telephone Encounter (Signed)
Pt states that she has a concern about recent lab values received while a patient at a local facility; states hgb level was 6.8; pt wants to know if she will need a blood transfusion; notified pt that a current lab draw would need to be obtained and she has not been seen in this office since 01/21/16;  Pt has a scheduled appt. On Nov. 11th  Please advise if pt needs to come for lab draw

## 2016-06-02 ENCOUNTER — Other Ambulatory Visit: Payer: Self-pay | Admitting: Internal Medicine

## 2016-06-02 ENCOUNTER — Telehealth: Payer: Self-pay

## 2016-06-02 DIAGNOSIS — D571 Sickle-cell disease without crisis: Secondary | ICD-10-CM

## 2016-06-02 MED ORDER — OXYCODONE HCL 10 MG PO TABS: 10.0000 mg | ORAL_TABLET | Freq: Four times a day (QID) | ORAL | 0 refills | Status: DC | PRN

## 2016-06-12 ENCOUNTER — Ambulatory Visit (INDEPENDENT_AMBULATORY_CARE_PROVIDER_SITE_OTHER): Payer: 59 | Admitting: Family Medicine

## 2016-06-12 ENCOUNTER — Encounter: Payer: Self-pay | Admitting: Family Medicine

## 2016-06-12 VITALS — BP 106/52 | HR 80 | Temp 98.2°F | Resp 18 | Ht 62.0 in | Wt 118.0 lb

## 2016-06-12 DIAGNOSIS — Z23 Encounter for immunization: Secondary | ICD-10-CM | POA: Diagnosis not present

## 2016-06-12 DIAGNOSIS — D571 Sickle-cell disease without crisis: Secondary | ICD-10-CM | POA: Diagnosis not present

## 2016-06-12 LAB — COMPLETE METABOLIC PANEL WITH GFR
ALT: 36 U/L — AB (ref 6–29)
AST: 39 U/L — ABNORMAL HIGH (ref 10–30)
Albumin: 4.8 g/dL (ref 3.6–5.1)
Alkaline Phosphatase: 71 U/L (ref 33–115)
BUN: 6 mg/dL — ABNORMAL LOW (ref 7–25)
CALCIUM: 9.7 mg/dL (ref 8.6–10.2)
CHLORIDE: 108 mmol/L (ref 98–110)
CO2: 22 mmol/L (ref 20–31)
CREATININE: 0.47 mg/dL — AB (ref 0.50–1.10)
GFR, Est Non African American: >89 mL/min (ref >=60)
Glucose, Bld: 81 mg/dL (ref 65–99)
Potassium: 4.1 mmol/L (ref 3.5–5.3)
Sodium: 139 mmol/L (ref 135–146)
Total Bilirubin: 2.7 mg/dL — ABNORMAL HIGH (ref 0.2–1.2)
Total Protein: 6.9 g/dL (ref 6.1–8.1)

## 2016-06-12 LAB — CBC WITH DIFFERENTIAL/PLATELET
BASOS ABS: 87 {cells}/uL (ref 0–200)
Basophils Relative: 1 %
EOS ABS: 87 {cells}/uL (ref 15–500)
Eosinophils Relative: 1 %
HEMATOCRIT: 22.2 % — AB (ref 35.0–45.0)
HEMOGLOBIN: 7.3 g/dL — AB (ref 11.7–15.5)
LYMPHS ABS: 3045 {cells}/uL (ref 850–3900)
Lymphocytes Relative: 35 %
MCH: 36.3 pg — AB (ref 27.0–33.0)
MCHC: 32.9 g/dL (ref 32.0–36.0)
MCV: 110.4 fL — AB (ref 80.0–100.0)
MONO ABS: 1653 {cells}/uL — AB (ref 200–950)
MPV: 9.1 fL (ref 7.5–12.5)
Monocytes Relative: 19 %
NEUTROS PCT: 44 %
Neutro Abs: 3828 cells/uL (ref 1500–7800)
Platelets: 466 10*3/uL — ABNORMAL HIGH (ref 140–400)
RBC: 2.01 MIL/uL — ABNORMAL LOW (ref 3.80–5.10)
RDW: 22.4 % — ABNORMAL HIGH (ref 11.0–15.0)
WBC: 8.7 10*3/uL (ref 3.8–10.8)

## 2016-06-12 LAB — RETICULOCYTES: RBC.: 2.01 MIL/uL — AB (ref 3.80–5.10)

## 2016-06-12 MED ORDER — VITAMIN D (ERGOCALCIFEROL) 1.25 MG (50000 UNIT) PO CAPS: 50000.0000 [IU] | ORAL_CAPSULE | ORAL | 2 refills | Status: DC

## 2016-06-12 MED ORDER — PNEUMOCOCCAL 13-VAL CONJ VACC IM SUSP: 0.5000 mL | INTRAMUSCULAR | Status: AC

## 2016-06-12 NOTE — Progress Notes (Signed)
Patient is here for FU  Patient denies pain at this time.  Patient has not taken medication. Patient has not eaten today.  Patient tolerated flu and pneumococcal vaccine well today.

## 2016-06-12 NOTE — Progress Notes (Signed)
Sandra Mckay, is a 20 y.o. female  [id number]  [id number]  DOB - 09/10/1995  CC:  Chief Complaint  Patient presents with  . Follow-up       HPI: Sandra Mckay is a 20 y.o. female here sickle cell follow-up. She reports no significant change in her status since her last visit about 6 months ago. Her mother is with her today. She was admitted for pain management on 8/7 and was seen in ED on 10/16. She reports taking her narcotic pain medicaton aobut 2-3 times a week. She reports an occ headache, occ lightheadedness. On further questioning it seems that this happens if she does not eat regularly. She on oxycodone 10 mg. She is on no long acting narcotic. She reports taking her medications reguarly.    No Known Allergies Past Medical History:  Diagnosis Date  . Blood transfusion without reported diagnosis   . Bronchitis   . Chickenpox   . Depression   . Heart murmur   . Sickle cell anemia (HCC)   . Urinary tract infection    Current Outpatient Prescriptions on File Prior to Visit  Medication Sig Dispense Refill  . DROXIA 300 MG capsule TAKE 1 CAPSULE (300 MG TOTAL) BY MOUTH DAILY. 30 capsule 0  . folic acid (FOLVITE) 1 MG tablet Take 1 tablet (1 mg total) by mouth daily. 90 tablet 3  . hydroxyurea (HYDREA) 500 MG capsule TAKE 3 CAPSULES (1,500 MG TOTAL) BY MOUTH DAILY. 90 capsule 0  . ibuprofen (ADVIL,MOTRIN) 600 MG tablet Take 1 tablet (600 mg total) by mouth every 8 (eight) hours as needed for moderate pain. 90 tablet 3  . metoCLOPramide (REGLAN) 10 MG tablet Take 10 mg by mouth every 6 (six) hours as needed for nausea or vomiting.    . ondansetron (ZOFRAN ODT) 4 MG disintegrating tablet Take 1 tablet (4 mg total) by mouth every 8 (eight) hours as needed for nausea or vomiting. 20 tablet 0  . ondansetron (ZOFRAN) 4 MG tablet Take 1 tablet (4 mg total) by mouth every 8 (eight) hours as needed for nausea or vomiting. 20 tablet 0  . Oxcarbazepine (TRILEPTAL) 300 MG  tablet Take 300 mg by mouth 2 (two) times daily. Reported on 10/27/2015    . Oxycodone HCl 10 MG TABS Take 1 tablet (10 mg total) by mouth every 6 (six) hours as needed. 60 tablet 0  . pantoprazole (PROTONIX) 20 MG tablet TAKE 1 TABLET DAILY 30 tablet 0   No current facility-administered medications on file prior to visit.    Family History  Problem Relation Age of Onset  . Arthritis Other     grandparent  . Stroke Other   . Hypertension Other   . Diabetes      grandparent  . Cancer - Other      Glioblastoma   Social History   Social History  . Marital status: Single    Spouse name: N/A  . Number of children: N/A  . Years of education: N/A   Occupational History  . Not on file.   Social History Main Topics  . Smoking status: Former Research scientist (life sciences)  . Smokeless tobacco: Not on file  . Alcohol use 0.0 oz/week     Comment: occ  . Drug use: No  . Sexual activity: Yes    Birth control/ protection: Implant   Other Topics Concern  . Not on file   Social History Narrative  . No narrative on file    Review of  Systems: Constitutional: Negative for fever, chills, weight loss, appetite loss HENT: Denies Problems Eyes: Denies problems Neck: Denies problems Respiratory: Freq cough, shortness of breath,   Cardiovascular: Negative for chest pain, palpitations and leg swelling. Gastrointestinal: Negative for abdominal pain. + for occ nausea,vomitng,  Genitourinary: Denies problems Musculoskeletal: + Reports chronic hip pain Neurological: Occ mild headache. Some episodes of lightheadedness. Hematological: Denies problems Psychiatric/Behavioral: Denies depression, anxiety.   Objective:   Vitals:   06/12/16 1147  BP: (!) 106/52  Pulse: 80  Resp: 18  Temp: 98.2 F (36.8 C)    Physical Exam: Constitutional: Patient appears well-developed and well-nourished. No distress. HENT: Normocephalic, atraumatic, External right and left ear normal. Oropharynx is clear and moist.  Eyes:  Conjunctivae and EOM are normal. PERRLA, no scleral icterus. Neck: Normal ROM. Neck supple. No lymphadenopathy, No thyromegaly. CVS: RRR, S1/S2 +, no murmurs, no gallops, no rubs Pulmonary: Effort and breath sounds normal, no stridor, rhonchi, wheezes, rales.  Abdominal: Soft. Normoactive BS,, no distension, tenderness, rebound or guarding.  Musculoskeletal: Normal range of motion. No edema and no tenderness.  Neuro: Alert.Normal muscle tone coordination. Non-focal Skin: Skin is warm and dry. No rash noted. Not diaphoretic. No erythema. No pallor. Psychiatric: Normal mood and affect. Behavior, judgment, thought content normal.  Lab Results  Component Value Date   WBC 8.7 06/12/2016   HGB 7.3 (L) 06/12/2016   HCT 22.2 (L) 06/12/2016   MCV 110.4 (H) 06/12/2016   PLT 466 (H) 06/12/2016   Lab Results  Component Value Date   CREATININE 0.47 (L) 06/12/2016   BUN 6 (L) 06/12/2016   NA 139 06/12/2016   K 4.1 06/12/2016   CL 108 06/12/2016   CO2 22 06/12/2016    No results found for: HGBA1C Lipid Panel  No results found for: CHOL, TRIG, HDL, CHOLHDL, VLDL, LDLCALC      Assessment and plan:   1. Hb-SS disease without crisis ([place])  - Flu Vaccine QUAD 36+ mos PF IM (Fluarix & Fluzone Quad PF) - pneumococcal 13-valent conjugate vaccine (PREVNAR 13) injection 0.5 mL; Inject 0.5 mLs into the muscle tomorrow at 10 am. - CBC with Differential - COMPLETE METABOLIC PANEL WITH GFR - Reticulocytes - Microalbumin, urine   -Sickle Cell Disease  Patient counseled and given handout on use of opoid pain medications, including need to only get from [place] and our ability to follow use on [place]  [facility] and need to keep in a safe locked place away from children and pets. Have reviewed our refill policy related to erly refills if lost or stolen. Have reviewed possible side effects of opoids, Hydrea and need to take other SCD related medications as ordered.  Have review health maintenance needs, including  immunizations, urine for proteim, dilated eye exam.  Have review the importance of smoking cessation if currently smoking.   The patient was given clear instructions to go to ER or return to medical center if symptoms don't improve, worsen or new problems develop. The patient verbalized understanding. The patient was told to call to get lab results if they haven't heard anything in the next week.     Return in about 3 months (around 09/12/2016).       Micheline Chapman, MSN, FNP-BC   06/15/2016, 8:30 AM

## 2016-06-13 LAB — MICROALBUMIN, URINE: Microalb, Ur: 1.8 mg/dL

## 2016-06-15 NOTE — Patient Instructions (Signed)
Continue current treatment. 

## 2016-07-06 ENCOUNTER — Other Ambulatory Visit: Payer: Self-pay | Admitting: Internal Medicine

## 2016-07-06 ENCOUNTER — Telehealth: Payer: Self-pay

## 2016-07-06 DIAGNOSIS — D571 Sickle-cell disease without crisis: Secondary | ICD-10-CM

## 2016-07-06 MED ORDER — OXYCODONE HCL 10 MG PO TABS: 10.0000 mg | ORAL_TABLET | Freq: Four times a day (QID) | ORAL | 0 refills | Status: DC | PRN

## 2016-07-15 ENCOUNTER — Encounter: Payer: Self-pay | Admitting: Family Medicine

## 2016-07-15 ENCOUNTER — Ambulatory Visit (INDEPENDENT_AMBULATORY_CARE_PROVIDER_SITE_OTHER): Payer: 59 | Admitting: Family Medicine

## 2016-07-15 VITALS — BP 116/66 | HR 70 | Temp 98.5°F | Ht 62.0 in | Wt 120.0 lb

## 2016-07-15 DIAGNOSIS — S32009D Unspecified fracture of unspecified lumbar vertebra, subsequent encounter for fracture with routine healing: Secondary | ICD-10-CM

## 2016-07-15 DIAGNOSIS — J3489 Other specified disorders of nose and nasal sinuses: Secondary | ICD-10-CM

## 2016-07-15 DIAGNOSIS — R05 Cough: Secondary | ICD-10-CM

## 2016-07-15 DIAGNOSIS — S32009A Unspecified fracture of unspecified lumbar vertebra, initial encounter for closed fracture: Secondary | ICD-10-CM | POA: Insufficient documentation

## 2016-07-15 DIAGNOSIS — D571 Sickle-cell disease without crisis: Secondary | ICD-10-CM

## 2016-07-15 DIAGNOSIS — G5793 Unspecified mononeuropathy of bilateral lower limbs: Secondary | ICD-10-CM | POA: Diagnosis not present

## 2016-07-15 DIAGNOSIS — R059 Cough, unspecified: Secondary | ICD-10-CM

## 2016-07-15 MED ORDER — CHLORPHEN-PE-ACETAMINOPHEN 4-10-325 MG PO TABS: 1.0000 | ORAL_TABLET | Freq: Four times a day (QID) | ORAL | 0 refills | Status: DC | PRN
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
## 2016-07-15 NOTE — Patient Instructions (Addendum)
Sent a referral to neurology Sent referral to orthopedic physician  Will forward medical records to specialist as they come available Sickle Cell Anemia, Adult Sickle cell anemia is a condition where your red blood cells are shaped like sickles. Red blood cells carry oxygen through the body. Sickle-shaped red blood cells do not live as long as normal red blood cells. They also clump together and block blood from flowing through the blood vessels. These things prevent the body from getting enough oxygen. Sickle cell anemia causes organ damage and pain. It also increases the risk of infection. Follow these instructions at home:  Drink enough fluid to keep your pee (urine) clear or pale yellow. Drink more in hot weather and during exercise.  Do not smoke. Smoking lowers oxygen levels in the blood.  Only take over-the-counter or prescription medicines as told by your doctor.  Take antibiotic medicines as told by your doctor. Make sure you finish them even if you start to feel better.  Take supplements as told by your doctor.  Consider wearing a medical alert bracelet. This tells anyone caring for you in an emergency of your condition.  When traveling, keep your medical information, doctors' names, and the medicines you take with you at all times.  If you have a fever, do not take fever medicines right away. This could cover up a problem. Tell your doctor.  Keep all follow-up visits with your doctor. Sickle cell anemia requires regular medical care. Contact a doctor if: You have a fever. Get help right away if:  You feel dizzy or faint.  You have new belly (abdominal) pain, especially on the left side near the stomach area.  You have a lasting, often uncomfortable and painful erection of the penis (priapism). If it is not treated right away, you will become unable to have sex (impotence).  You have numbness in your arms or legs or you have a hard time moving them.  You have a hard  time talking.  You have a fever or lasting symptoms for more than 2-3 days.  You have a fever and your symptoms suddenly get worse.  You have signs or symptoms of infection. These include:  Chills.  Being more tired than normal (lethargy).  Irritability.  Poor eating.  Throwing up (vomiting).  You have pain that is not helped with medicine.  You have shortness of breath.  You have pain in your chest.  You are coughing up pus-like or bloody mucus.  You have a stiff neck.  Your feet or hands swell or have pain.  Your belly looks bloated.  Your joints hurt. This information is not intended to replace advice given to you by your health care provider. Make sure you discuss any questions you have with your health care provider. Document Released: 05/10/2013 Document Revised: 12/26/2015 Document Reviewed: 03/01/2013 Elsevier Interactive Patient Education  2017 [employer].  Vertebral Fracture Introduction A vertebral fracture means that one of the bones in the spine is broken (fractured). These bones are called vertebrae. You may have back pain that gets worse when you move. Vertebral fractures can be mild or severe. Many will get better without surgery. Surgery may be needed for more severe breaks. Follow these instructions at home: General instructions  Take medicines only as told by your doctor.  Do not drive or use heavy machinery while taking pain medicine.  If told, put ice on the injured area:  Put ice in a plastic bag.  Place a towel between  your skin and the bag.  Leave the ice on for 30 minutes every 2 hours at first. Then use the ice as needed.  Wear your neck brace or back brace as told by your doctor.  Do not drink alcohol.  Keep all follow-up visits as told by your doctor. This is important. Activity  Stay in bed (on bed rest) only as told by your doctor. Being on bed rest for too long can make your condition worse.  Return to your normal  activities as told by your doctor. Ask your doctor what is safe for you to do.  Do exercises for your back (physical therapy) as told by your doctor.  Exercise often as told by your doctor. Contact a doctor if:  You have a fever.  You have a cough that makes your pain worse.  Your pain medicine is not helping.  Your pain does not get better over time.  You cannot return to your normal activities as planned. Get help right away if:  Your pain is very bad and it suddenly gets worse.  You are not able to move any body part (paralysis) that is below the level of your injury.  You have numbness, tingling, or weakness in any body part that is below the level of your injury.  You cannot control when you pee (urinate) or when you poop (have bowel movements). This information is not intended to replace advice given to you by your health care provider. Make sure you discuss any questions you have with your health care provider. Document Released: 01/07/2010 Document Revised: 12/26/2015 Document Reviewed: 07/25/2014  2017 Elsevier

## 2016-08-08 ENCOUNTER — Other Ambulatory Visit: Payer: Self-pay | Admitting: Family Medicine

## 2016-08-13 ENCOUNTER — Telehealth: Payer: Self-pay

## 2016-08-13 DIAGNOSIS — D571 Sickle-cell disease without crisis: Secondary | ICD-10-CM

## 2016-08-13 MED ORDER — OXYCODONE HCL 10 MG PO TABS: 10.0000 mg | ORAL_TABLET | Freq: Four times a day (QID) | ORAL | 0 refills | Status: DC | PRN

## 2016-08-13 NOTE — Telephone Encounter (Signed)
Reviewed [place] Substance Reporting system prior to prescribing opiate medications. No inconsistencies noted.   Meds ordered this encounter  Medications  . Oxycodone HCl 10 MG TABS    Sig: Take 1 tablet (10 mg total) by mouth every 6 (six) hours as needed.    Dispense:  60 tablet    Refill:  0    Order Specific Question:   Supervising Provider    Answer:   Tresa Garter [id number]    Dorena Dew, FNP

## 2016-08-13 NOTE — Telephone Encounter (Signed)
Refill request for oxycodone 10mg . Lov 07/15/2016. Please advise. Thanks!

## 2016-08-24 ENCOUNTER — Ambulatory Visit: Payer: 59 | Admitting: Family Medicine

## 2016-09-16 ENCOUNTER — Telehealth: Payer: Self-pay

## 2016-09-16 DIAGNOSIS — D571 Sickle-cell disease without crisis: Secondary | ICD-10-CM

## 2016-09-17 MED ORDER — OXYCODONE HCL 10 MG PO TABS: 10.0000 mg | ORAL_TABLET | Freq: Four times a day (QID) | ORAL | 0 refills | Status: DC | PRN

## 2016-09-17 NOTE — Telephone Encounter (Signed)
Reviewed [place] Substance Reporting system prior to prescribing opiate medications. No inconsistencies noted.   Meds ordered this encounter  Medications  . Oxycodone HCl 10 MG TABS    Sig: Take 1 tablet (10 mg total) by mouth every 6 (six) hours as needed.    Dispense:  60 tablet    Refill:  0    Order Specific Question:   Supervising Provider    Answer:   Tresa Garter [id number]     Dorena Dew, FNP

## 2016-09-18 ENCOUNTER — Encounter: Payer: Self-pay | Admitting: Neurology

## 2016-09-18 ENCOUNTER — Ambulatory Visit (INDEPENDENT_AMBULATORY_CARE_PROVIDER_SITE_OTHER): Payer: 59 | Admitting: Neurology

## 2016-09-18 ENCOUNTER — Ambulatory Visit: Payer: 59 | Admitting: Family Medicine

## 2016-09-18 VITALS — BP 110/74 | Temp 98.9°F | Ht 62.0 in | Wt 122.2 lb

## 2016-09-18 DIAGNOSIS — M79604 Pain in right leg: Secondary | ICD-10-CM

## 2016-09-18 DIAGNOSIS — R55 Syncope and collapse: Secondary | ICD-10-CM | POA: Diagnosis not present

## 2016-09-18 DIAGNOSIS — M79605 Pain in left leg: Secondary | ICD-10-CM
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
## 2016-09-18 NOTE — Patient Instructions (Addendum)
If your vision changes or headaches get worse, come back and see me Stay well hydrated and avoid extreme heat

## 2016-09-21 ENCOUNTER — Ambulatory Visit: Payer: 59 | Admitting: Family Medicine

## 2016-10-01 ENCOUNTER — Ambulatory Visit: Payer: 59 | Admitting: Family Medicine

## 2016-10-14 ENCOUNTER — Telehealth: Payer: Self-pay

## 2016-10-20 ENCOUNTER — Other Ambulatory Visit: Payer: Self-pay | Admitting: Internal Medicine

## 2016-10-20 DIAGNOSIS — D571 Sickle-cell disease without crisis: Secondary | ICD-10-CM

## 2016-10-20 MED ORDER — OXYCODONE HCL 10 MG PO TABS: 10.0000 mg | ORAL_TABLET | Freq: Four times a day (QID) | ORAL | 0 refills | Status: DC | PRN

## 2016-10-22 ENCOUNTER — Ambulatory Visit: Payer: 59 | Admitting: Family Medicine

## 2016-11-20 ENCOUNTER — Encounter: Payer: Self-pay | Admitting: Family Medicine

## 2016-11-20 ENCOUNTER — Ambulatory Visit (INDEPENDENT_AMBULATORY_CARE_PROVIDER_SITE_OTHER): Payer: 59 | Admitting: Family Medicine

## 2016-11-20 ENCOUNTER — Ambulatory Visit (HOSPITAL_COMMUNITY)
Admission: RE | Admit: 2016-11-20 | Discharge: 2016-11-20 | Disposition: A | Discharge status: Home / Self Care | Payer: 59 | Source: Ambulatory Visit | Attending: Family Medicine | Admitting: Family Medicine

## 2016-11-20 VITALS — BP 111/61 | HR 86 | Temp 98.5°F | Wt 125.0 lb

## 2016-11-20 DIAGNOSIS — M542 Cervicalgia: Secondary | ICD-10-CM | POA: Diagnosis not present

## 2016-11-20 DIAGNOSIS — Z202 Contact with and (suspected) exposure to infections with a predominantly sexual mode of transmission: Secondary | ICD-10-CM | POA: Diagnosis not present

## 2016-11-20 DIAGNOSIS — Z79891 Long term (current) use of opiate analgesic: Secondary | ICD-10-CM | POA: Diagnosis not present

## 2016-11-20 DIAGNOSIS — D571 Sickle-cell disease without crisis: Secondary | ICD-10-CM | POA: Diagnosis not present

## 2016-11-20 DIAGNOSIS — Z114 Encounter for screening for human immunodeficiency virus [HIV]: Secondary | ICD-10-CM | POA: Diagnosis not present

## 2016-11-20 LAB — CBC WITH DIFFERENTIAL/PLATELET
Basophils Absolute: 0 cells/uL (ref 0–200)
Basophils Relative: 0 %
EOS ABS: 0 {cells}/uL — AB (ref 15–500)
Eosinophils Relative: 0 %
HEMATOCRIT: 21 % — AB (ref 35.0–45.0)
Hemoglobin: 7.1 g/dL — ABNORMAL LOW (ref 11.7–15.5)
LYMPHS PCT: 30 %
Lymphs Abs: 3390 cells/uL (ref 850–3900)
MCH: 39.2 pg — ABNORMAL HIGH (ref 27.0–33.0)
MCHC: 33.8 g/dL (ref 32.0–36.0)
MCV: 116 fL — AB (ref 80.0–100.0)
MONO ABS: 1808 {cells}/uL — AB (ref 200–950)
MONOS PCT: 16 %
MPV: 8.8 fL (ref 7.5–12.5)
Neutro Abs: 6102 cells/uL (ref 1500–7800)
Neutrophils Relative %: 54 %
PLATELETS: 435 10*3/uL — AB (ref 140–400)
RBC: 1.81 MIL/uL — AB (ref 3.80–5.10)
RDW: 17.8 % — AB (ref 11.0–15.0)
WBC: 11.3 10*3/uL — ABNORMAL HIGH (ref 3.8–10.8)

## 2016-11-20 LAB — POCT URINALYSIS DIP (DEVICE)
BILIRUBIN URINE: NEGATIVE
Glucose, UA: NEGATIVE mg/dL
Hgb urine dipstick: NEGATIVE
Ketones, ur: NEGATIVE mg/dL
LEUKOCYTES UA: NEGATIVE
NITRITE: NEGATIVE
Protein, ur: NEGATIVE mg/dL
Specific Gravity, Urine: 1.01 (ref 1.005–1.030)
UROBILINOGEN UA: 2 mg/dL — AB (ref 0.0–1.0)
pH: 5.5 (ref 5.0–8.0)

## 2016-11-20 LAB — COMPLETE METABOLIC PANEL WITH GFR
ALBUMIN: 4.6 g/dL (ref 3.6–5.1)
ALK PHOS: 65 U/L (ref 33–115)
ALT: 13 U/L (ref 6–29)
AST: 30 U/L (ref 10–30)
BUN: 6 mg/dL — ABNORMAL LOW (ref 7–25)
CO2: 24 mmol/L (ref 20–31)
Calcium: 9.5 mg/dL (ref 8.6–10.2)
Chloride: 104 mmol/L (ref 98–110)
Creat: 0.5 mg/dL (ref 0.50–1.10)
GFR, Est African American: >89 mL/min (ref >=60)
Glucose, Bld: 86 mg/dL (ref 65–99)
POTASSIUM: 3.8 mmol/L (ref 3.5–5.3)
Sodium: 136 mmol/L (ref 135–146)
Total Bilirubin: 5.1 mg/dL — ABNORMAL HIGH (ref 0.2–1.2)
Total Protein: 6.6 g/dL (ref 6.1–8.1)

## 2016-11-20 MED ORDER — OXYCODONE HCL 10 MG PO TABS: 10.0000 mg | ORAL_TABLET | Freq: Four times a day (QID) | ORAL | 0 refills | Status: DC | PRN

## 2016-11-20 NOTE — Patient Instructions (Addendum)
Cervicalgia-Will review xray and follow up by phone Sickle cell anemia-Will continue medications as previously prescribed. Apply warm, moist compresses as needed.     Musculoskeletal Pain Musculoskeletal pain is muscle and bone aches and pains. This pain can occur in any part of the body. Follow these instructions at home:  Only take medicines for pain, discomfort, or fever as told by your health care provider.  You may continue all activities unless the activities cause more pain. When the pain lessens, slowly resume normal activities. Gradually increase the intensity and duration of the activities or exercise.  During periods of severe pain, bed rest may be helpful. Lie or sit in any position that is comfortable, but get out of bed and walk around at least every several hours.  If directed, put ice on the injured area.  Put ice in a plastic bag.  Place a towel between your skin and the bag.  Leave the ice on for 20 minutes, 2-3 times a day. Contact a health care provider if:  Your pain is getting worse.  Your pain is not relieved with medicines.  You lose function in the area of the pain if the pain is in your arms, legs, or neck. This information is not intended to replace advice given to you by your health care provider. Make sure you discuss any questions you have with your health care provider. Document Released: 07/20/2005 Document Revised: 12/31/2015 Document Reviewed: 03/24/2013 Elsevier Interactive Patient Education  2017 [employer]  Sickle Cell Anemia, Adult Sickle cell anemia is a condition where your red blood cells are shaped like sickles. Red blood cells carry oxygen through the body. Sickle-shaped red blood cells do not live as long as normal red blood cells. They also clump together and block blood from flowing through the blood vessels. These things prevent the body from getting enough oxygen. Sickle cell anemia causes organ damage and pain. It also increases  the risk of infection. Follow these instructions at home:  Drink enough fluid to keep your pee (urine) clear or pale yellow. Drink more in hot weather and during exercise.  Do not smoke. Smoking lowers oxygen levels in the blood.  Only take over-the-counter or prescription medicines as told by your doctor.  Take antibiotic medicines as told by your doctor. Make sure you finish them even if you start to feel better.  Take supplements as told by your doctor.  Consider wearing a medical alert bracelet. This tells anyone caring for you in an emergency of your condition.  When traveling, keep your medical information, doctors' names, and the medicines you take with you at all times.  If you have a fever, do not take fever medicines right away. This could cover up a problem. Tell your doctor.  Keep all follow-up visits with your doctor. Sickle cell anemia requires regular medical care. Contact a doctor if: You have a fever. Get help right away if:  You feel dizzy or faint.  You have new belly (abdominal) pain, especially on the left side near the stomach area.  You have a lasting, often uncomfortable and painful erection of the penis (priapism). If it is not treated right away, you will become unable to have sex (impotence).  You have numbness in your arms or legs or you have a hard time moving them.  You have a hard time talking.  You have a fever or lasting symptoms for more than 2-3 days.  You have a fever and your symptoms suddenly get  worse.  You have signs or symptoms of infection. These include:  Chills.  Being more tired than normal (lethargy).  Irritability.  Poor eating.  Throwing up (vomiting).  You have pain that is not helped with medicine.  You have shortness of breath.  You have pain in your chest.  You are coughing up pus-like or bloody mucus.  You have a stiff neck.  Your feet or hands swell or have pain.  Your belly looks bloated.  Your  joints hurt. This information is not intended to replace advice given to you by your health care provider. Make sure you discuss any questions you have with your health care provider. Document Released: 05/10/2013 Document Revised: 12/26/2015 Document Reviewed: 03/01/2013 Elsevier Interactive Patient Education  2017 [employer].
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
## 2016-11-21 LAB — VITAMIN D 25 HYDROXY (VIT D DEFICIENCY, FRACTURES): Vit D, 25-Hydroxy: 6 ng/mL — ABNORMAL LOW (ref 30–100)

## 2016-11-21 LAB — HIV ANTIBODY (ROUTINE TESTING W REFLEX): HIV 1&2 Ab, 4th Generation: NONREACTIVE

## 2016-11-21 LAB — GC/CHLAMYDIA PROBE AMP
CT PROBE, AMP APTIMA: NOT DETECTED
GC PROBE AMP APTIMA: NOT DETECTED

## 2016-11-21 LAB — RPR

## 2016-11-23 ENCOUNTER — Other Ambulatory Visit: Payer: Self-pay | Admitting: Family Medicine

## 2016-11-23 DIAGNOSIS — E559 Vitamin D deficiency, unspecified: Secondary | ICD-10-CM

## 2016-11-23 MED ORDER — VITAMIN D (ERGOCALCIFEROL) 1.25 MG (50000 UNIT) PO CAPS: 50000.0000 [IU] | ORAL_CAPSULE | ORAL | 2 refills | Status: DC

## 2016-11-28 LAB — PAIN MGMT, PROFILE 8 W/CONF, U
6 ACETYLMORPHINE: NEGATIVE ng/mL (ref <10)
ALCOHOL METABOLITES: NEGATIVE ng/mL (ref <500)
ALPHAHYDROXYALPRAZOLAM: 76 ng/mL — AB (ref <25)
AMINOCLONAZEPAM: NEGATIVE ng/mL (ref <25)
Alphahydroxymidazolam: NEGATIVE ng/mL (ref <50)
Alphahydroxytriazolam: NEGATIVE ng/mL (ref <50)
Amphetamines: NEGATIVE ng/mL (ref <500)
Benzodiazepines: POSITIVE ng/mL — AB (ref <100)
Buprenorphine: NEGATIVE ng/mL (ref <5)
COCAINE METABOLITE: NEGATIVE ng/mL (ref <150)
CREATININE: 62.4 mg/dL (ref >=20.0)
Hydroxyethylflurazepam: NEGATIVE ng/mL (ref <50)
LORAZEPAM: NEGATIVE ng/mL (ref <50)
MDMA: NEGATIVE ng/mL (ref <500)
Marijuana Metabolite: 513 ng/mL — ABNORMAL HIGH (ref <5)
Marijuana Metabolite: POSITIVE ng/mL — AB (ref <20)
Nordiazepam: NEGATIVE ng/mL (ref <50)
OPIATES: NEGATIVE ng/mL (ref <100)
OXIDANT: NEGATIVE ug/mL (ref <200)
Oxazepam: NEGATIVE ng/mL (ref <50)
Oxycodone: NEGATIVE ng/mL (ref <100)
PH: 6.07 (ref 4.5–9.0)
Please note:: 0
TEMAZEPAM: NEGATIVE ng/mL (ref <50)

## 2016-11-30 ENCOUNTER — Emergency Department (HOSPITAL_COMMUNITY)
Admission: EM | Admit: 2016-11-30 | Discharge: 2016-11-30 | Disposition: A | Discharge status: Home / Self Care | Payer: 59 | Attending: Emergency Medicine | Admitting: Emergency Medicine

## 2016-11-30 ENCOUNTER — Encounter (HOSPITAL_COMMUNITY): Payer: Self-pay | Admitting: Emergency Medicine

## 2016-11-30 ENCOUNTER — Emergency Department (HOSPITAL_COMMUNITY): Payer: 59

## 2016-11-30 DIAGNOSIS — Z87891 Personal history of nicotine dependence: Secondary | ICD-10-CM | POA: Diagnosis not present

## 2016-11-30 DIAGNOSIS — D5700 Hb-SS disease with crisis, unspecified: Secondary | ICD-10-CM | POA: Diagnosis not present

## 2016-11-30 LAB — CBC WITH DIFFERENTIAL/PLATELET
BASOS ABS: 0.1 10*3/uL (ref 0.0–0.1)
Basophils Relative: 1 %
Eosinophils Absolute: 0.1 10*3/uL (ref 0.0–0.7)
Eosinophils Relative: 1 %
HCT: 20.5 % — ABNORMAL LOW (ref 36.0–46.0)
HEMOGLOBIN: 7.4 g/dL — AB (ref 12.0–15.0)
LYMPHS PCT: 21 %
Lymphs Abs: 2.6 10*3/uL (ref 0.7–4.0)
MCH: 38.7 pg — ABNORMAL HIGH (ref 26.0–34.0)
MCHC: 36.1 g/dL — ABNORMAL HIGH (ref 30.0–36.0)
MCV: 107.3 fL — ABNORMAL HIGH (ref 78.0–100.0)
MONOS PCT: 21 %
Monocytes Absolute: 2.6 10*3/uL — ABNORMAL HIGH (ref 0.1–1.0)
Neutro Abs: 6.8 10*3/uL (ref 1.7–7.7)
Neutrophils Relative %: 56 %
Platelets: 462 10*3/uL — ABNORMAL HIGH (ref 150–400)
RBC: 1.91 MIL/uL — AB (ref 3.87–5.11)
RDW: 22.3 % — ABNORMAL HIGH (ref 11.5–15.5)
WBC: 12.2 10*3/uL — AB (ref 4.0–10.5)

## 2016-11-30 LAB — COMPREHENSIVE METABOLIC PANEL
ALBUMIN: 4.9 g/dL (ref 3.5–5.0)
ALT: 30 U/L (ref 14–54)
AST: 59 U/L — ABNORMAL HIGH (ref 15–41)
Alkaline Phosphatase: 96 U/L (ref 38–126)
Anion gap: 8 (ref 5–15)
BILIRUBIN TOTAL: 4.2 mg/dL — AB (ref 0.3–1.2)
BUN: 9 mg/dL (ref 6–20)
CO2: 23 mmol/L (ref 22–32)
Calcium: 9.5 mg/dL (ref 8.9–10.3)
Chloride: 105 mmol/L (ref 101–111)
Creatinine, Ser: 0.41 mg/dL — ABNORMAL LOW (ref 0.44–1.00)
GFR calc Af Amer: >60 mL/min (ref >60)
Glucose, Bld: 97 mg/dL (ref 65–99)
POTASSIUM: 4 mmol/L (ref 3.5–5.1)
Sodium: 136 mmol/L (ref 135–145)
TOTAL PROTEIN: 7.2 g/dL (ref 6.5–8.1)

## 2016-11-30 LAB — RETICULOCYTES: RBC.: 1.91 MIL/uL — ABNORMAL LOW (ref 3.87–5.11)

## 2016-11-30 MED ORDER — HYDROMORPHONE HCL 1 MG/ML IJ SOLN: 0.5000 mg | INTRAMUSCULAR | Status: AC

## 2016-11-30 MED ORDER — KETOROLAC TROMETHAMINE 15 MG/ML IJ SOLN
15.0000 mg | INTRAMUSCULAR | Status: AC
  Administered 2016-11-30: 15 mg via INTRAVENOUS
  Filled 2016-11-30: qty 1

## 2016-11-30 MED ORDER — HYDROMORPHONE HCL 1 MG/ML IJ SOLN
0.5000 mg | INTRAMUSCULAR | Status: AC
  Administered 2016-11-30: 1 mg via INTRAVENOUS
  Filled 2016-11-30: qty 1

## 2016-11-30 MED ORDER — DIPHENHYDRAMINE HCL 25 MG PO CAPS: 25.0000 mg | ORAL_CAPSULE | ORAL | Status: DC | PRN

## 2016-11-30 MED ORDER — ONDANSETRON HCL 4 MG/2ML IJ SOLN: 4.0000 mg | INTRAMUSCULAR | Status: DC | PRN

## 2016-11-30 MED ORDER — SODIUM CHLORIDE 0.45 % IV SOLN
INTRAVENOUS | Status: DC
  Administered 2016-11-30: 13:00:00 via INTRAVENOUS

## 2016-11-30 MED ORDER — HYDROMORPHONE HCL 1 MG/ML IJ SOLN
0.5000 mg | INTRAMUSCULAR | Status: AC
Start: 2016-11-30 — End: 2016-11-30

## 2016-11-30 NOTE — ED Triage Notes (Signed)
Patient c/o generalized body pains with sickle cell since last night.

## 2016-11-30 NOTE — ED Provider Notes (Signed)
[facility] DEPT Provider Note   CSN: [id number] Arrival date & time: 11/30/16  1133     History   Chief Complaint Chief Complaint  Patient presents with  . Sickle Cell Pain Crisis  . Generalized Body Aches    HPI Sandra Mckay is a 21 y.o. female.  Sandra Mckay is a 21 y.o. Female with a history of sickle cell anemia who presents to the emergency department complaining of a sickle cell pain crisis ongoing since yesterday. She reports she feels generally bad all over. She reports she has worsening pain in the upper part of her back, her bilateral low back, her bilateral ankles and legs. She denies any chest pain or shortness of breath. She reports taking a dose of her OxyContin 2 mg last night without relief. She reports an episode of vomiting yesterday. No vomiting today. She denies abdominal pain. She reports she does not take her narcotic pain medicines daily. She denies fevers, abdominal pain, hematemesis, diarrhea, urinary symptoms, chest pain, cough, shortness of breath, or rashes.    The history is provided by the patient, medical records and a parent. No language interpreter was used.  Sickle Cell Pain Crisis  Associated symptoms: vomiting   Associated symptoms: no chest pain, no congestion, no cough, no fever, no headaches, no nausea, no shortness of breath, no sore throat and no wheezing     Past Medical History:  Diagnosis Date  . Blood transfusion without reported diagnosis   . Bronchitis   . Chickenpox   . Depression   . Heart murmur   . Sickle cell anemia (HCC)   . Urinary tract infection     Patient Active Problem List   Diagnosis Date Noted  . Vasovagal syncope 09/18/2016  . Closed fracture of lumbar vertebra without spinal cord injury ([place]) 07/15/2016  . Nausea without vomiting 09/09/2015  . Vitamin D deficiency 07/22/2015  . Sickle cell disease ([place]) 06/08/2015    Past Surgical History:  Procedure Laterality Date  . CHOLECYSTECTOMY  2011    . EYE SURGERY     Sty removal  . LABIAL ADHESION LYSIS  1999  . 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 TONSILLECTOMY  2012    OB History    No data available       Home Medications    Prior to Admission medications   Medication Sig Start Date End Date Taking? Authorizing Provider  DROXIA 300 MG capsule TAKE 1 CAPSULE (300 MG TOTAL) BY MOUTH DAILY. 10/17/15  Yes Dorena Dew, FNP  folic acid (FOLVITE) 1 MG tablet Take 1 tablet (1 mg total) by mouth daily. 12/17/15  Yes Olugbemiga Essie Christine, MD  hydroxyurea (HYDREA) 500 MG capsule TAKE 3 CAPSULES (1,500 MG TOTAL) BY MOUTH DAILY. 08/10/16  Yes Dorena Dew, FNP  ibuprofen (ADVIL,MOTRIN) 600 MG tablet Take 1 tablet (600 mg total) by mouth every 8 (eight) hours as needed for moderate pain. 12/17/15  Yes Tresa Garter, MD  ondansetron (ZOFRAN) 4 MG tablet Take 1 tablet (4 mg total) by mouth every 8 (eight) hours as needed for nausea or vomiting. 09/09/15  Yes Dorena Dew, FNP  Oxycodone HCl 10 MG TABS Take 1 tablet (10 mg total) by mouth every 6 (six) hours as needed. 11/20/16  Yes Dorena Dew, FNP  pantoprazole (PROTONIX) 20 MG tablet TAKE 1 TABLET DAILY 08/10/16  Yes Dorena Dew, FNP  Vitamin D, Ergocalciferol, (DRISDOL) 50000 units  CAPS capsule Take 1 capsule (50,000 Units total) by mouth every Saturday. Saturday. 11/28/16  Yes Dorena Dew, FNP  Oxcarbazepine (TRILEPTAL) 300 MG tablet Take 300 mg by mouth 2 (two) times daily. Reported on 10/27/2015    Historical Provider, MD    Family History Family History  Problem Relation Age of Onset  . Arthritis Other     grandparent  . Stroke Other   . Hypertension Other   . Diabetes      grandparent  . Cancer - Other      Glioblastoma    Social History Social History  Substance Use Topics  . Smoking status: Former Research scientist (life sciences)  . Smokeless tobacco: Never Used  . Alcohol use 0.0 oz/week     Comment: occ     Allergies   Patient  has no known allergies.   Review of Systems Review of Systems  Constitutional: Negative for chills and fever.  HENT: Negative for congestion and sore throat.   Eyes: Negative for visual disturbance.  Respiratory: Negative for cough, shortness of breath and wheezing.   Cardiovascular: Negative for chest pain, palpitations and leg swelling.  Gastrointestinal: Positive for vomiting. Negative for abdominal pain, diarrhea and nausea.  Genitourinary: Negative for dysuria, flank pain, frequency, hematuria and urgency.  Musculoskeletal: Positive for arthralgias and back pain. Negative for neck pain and neck stiffness.  Skin: Negative for rash.  Neurological: Negative for weakness, light-headedness and headaches.     Physical Exam Updated Vital Signs BP 119/76   Pulse 84   Temp 98.6 F (37 C) (Oral)   Resp 18   Ht 5\' 2"  (1.575 m)   Wt 54.7 kg   SpO2 95%   BMI 22.05 kg/m   Physical Exam  Constitutional: She is oriented to person, place, and time. She appears well-developed and well-nourished. No distress.  Nontoxic appearing.  HENT:  Head: Normocephalic and atraumatic.  Mouth/Throat: Oropharynx is clear and moist.  Eyes: Conjunctivae are normal. Pupils are equal, round, and reactive to light. Right eye exhibits no discharge. Left eye exhibits no discharge.  Neck: Normal range of motion. Neck supple. No JVD present.  Cardiovascular: Normal rate, regular rhythm, normal heart sounds and intact distal pulses.  Exam reveals no gallop and no friction rub.   No murmur heard. Pulmonary/Chest: Effort normal and breath sounds normal. No stridor. No respiratory distress. She has no wheezes. She has no rales.  Lungs are clear to ascultation bilaterally. Symmetric chest expansion bilaterally. No increased work of breathing. No rales or rhonchi.    Abdominal: Soft. Bowel sounds are normal. She exhibits no distension. There is no tenderness. There is no guarding.  Abdomen is soft and nontender to  palpation. Bowel sounds are present. No peritoneal signs.  Musculoskeletal: Normal range of motion. She exhibits no edema or deformity.  Patient is spontaneously moving all extremities in a coordinated fashion exhibiting good strength.  Patient's bilateral shoulder, elbow, wrist, hip, knee and ankle joints are supple and without erythema or edema.  Lymphadenopathy:    She has no cervical adenopathy.  Neurological: She is alert and oriented to person, place, and time. Coordination normal.  Skin: Skin is warm and dry. Capillary refill takes less than 2 seconds. No rash noted. She is not diaphoretic. No erythema. No pallor.  Psychiatric: She has a normal mood and affect. Her behavior is normal.  Nursing note and vitals reviewed.    ED Treatments / Results  Labs (all labs ordered are listed, but only abnormal results  are displayed) Labs Reviewed  COMPREHENSIVE METABOLIC PANEL - Abnormal; Notable for the following:       Result Value   Creatinine, Ser 0.41 (*)    AST 59 (*)    Total Bilirubin 4.2 (*)    All other components within normal limits  CBC WITH DIFFERENTIAL/PLATELET - Abnormal; Notable for the following:    WBC 12.2 (*)    RBC 1.91 (*)    Hemoglobin 7.4 (*)    HCT 20.5 (*)    MCV 107.3 (*)    MCH 38.7 (*)    MCHC 36.1 (*)    RDW 22.3 (*)    Platelets 462 (*)    Monocytes Absolute 2.6 (*)    All other components within normal limits  RETICULOCYTES - Abnormal; Notable for the following:    Retic Ct Pct >23.0 (*)    RBC. 1.91 (*)    All other components within normal limits    EKG  EKG Interpretation None       Radiology Dg Chest Port 1 View  Result Date: 11/30/2016 CLINICAL DATA:  Patient with generalized body pain and aches. EXAM: PORTABLE CHEST 1 VIEW COMPARISON:  Chest radiograph 01/21/2016. FINDINGS: Monitoring leads overlie the patient. Stable enlarged cardiac and mediastinal contours. No consolidative pulmonary opacities. No pleural effusion or  pneumothorax. IMPRESSION: No acute cardiopulmonary process. Electronically Signed   By: Lovey Newcomer M.D.   On: 11/30/2016 13:43    Procedures Procedures (including critical care time)  Medications Ordered in ED Medications  0.45 % sodium chloride infusion ( Intravenous New Bag/Given 11/30/16 1305)  diphenhydrAMINE (BENADRYL) capsule 25-50 mg (not administered)  ondansetron (ZOFRAN) injection 4 mg (not administered)  ketorolac (TORADOL) 15 MG/ML injection 15 mg (15 mg Intravenous Given 11/30/16 1305)  HYDROmorphone (DILAUDID) injection 0.5-1 mg (1 mg Intravenous Given 11/30/16 1305)    Or  HYDROmorphone (DILAUDID) injection 0.5-1 mg ( Subcutaneous See Alternative 11/30/16 1305)  HYDROmorphone (DILAUDID) injection 0.5-1 mg (1 mg Intravenous Given 11/30/16 1349)    Or  HYDROmorphone (DILAUDID) injection 0.5-1 mg ( Subcutaneous See Alternative 11/30/16 1349)  HYDROmorphone (DILAUDID) injection 0.5-1 mg (1 mg Intravenous Given 11/30/16 1423)    Or  HYDROmorphone (DILAUDID) injection 0.5-1 mg ( Subcutaneous See Alternative 11/30/16 1423)     Initial Impression / Assessment and Plan / ED Course  I have reviewed the triage vital signs and the nursing notes.  Pertinent labs & imaging results that were available during my care of the patient were reviewed by me and considered in my medical decision making (see chart for details).    This  is a 21 y.o. Female with a history of sickle cell anemia who presents to the emergency department complaining of a sickle cell pain crisis ongoing since yesterday. She reports she feels generally bad all over. She reports she has worsening pain in the upper part of her back, her bilateral low back, her bilateral ankles and legs. She denies any chest pain or shortness of breath. She reports taking a dose of her OxyContin 2 mg last night without relief. She reports an episode of vomiting yesterday. No vomiting today. She denies abdominal pain.  On exam the patient is  afebrile and nontoxic appearing. Lungs are clear to auscultation bilaterally. Abdomen is soft and nontender to palpation. Joints are supple and without erythema or edema. CMP shows preserved kidney function. CBC is remarkable for white count of 12,000 and hemoglobin of 7.4. The single-lumen is around her baseline of 7. Reticulocyte  count percentage is greater than 23%. Patient received 3 rounds of pain medicine in the emergency department. At each recheck she reports feeling much better. At final reevaluation patient reports she is feeling much better and ready to go home. She's drinking sprite and eating foot without nausea or vomiting. I encouraged her to follow-up with her sickle cell doctor. Discussed strict and specific return precautions. I advised the patient to follow-up with their primary care provider this week. I advised the patient to return to the emergency department with new or worsening symptoms or new concerns. The patient and her mother verbalized understanding and agreement with plan.     Final Clinical Impressions(s) / ED Diagnoses   Final diagnoses:  Sickle cell pain crisis [facility])    New Prescriptions New Prescriptions   No medications on file     Waynetta Pean, PA-C 11/30/16 [place], MD 12/02/16 1546

## 2016-12-05 ENCOUNTER — Encounter (HOSPITAL_COMMUNITY): Payer: Self-pay | Admitting: Nurse Practitioner

## 2016-12-05 ENCOUNTER — Inpatient Hospital Stay (HOSPITAL_COMMUNITY)
Admission: EM | Admit: 2016-12-05 | Discharge: 2016-12-09 | DRG: 812 | Disposition: A | Discharge status: Home / Self Care | Payer: 59 | Attending: Internal Medicine | Admitting: Internal Medicine

## 2016-12-05 DIAGNOSIS — R0902 Hypoxemia: Secondary | ICD-10-CM | POA: Diagnosis present

## 2016-12-05 DIAGNOSIS — E559 Vitamin D deficiency, unspecified: Secondary | ICD-10-CM | POA: Diagnosis present

## 2016-12-05 DIAGNOSIS — Z87891 Personal history of nicotine dependence: Secondary | ICD-10-CM

## 2016-12-05 DIAGNOSIS — D5700 Hb-SS disease with crisis, unspecified: Secondary | ICD-10-CM

## 2016-12-05 DIAGNOSIS — K5900 Constipation, unspecified: Secondary | ICD-10-CM | POA: Diagnosis present

## 2016-12-05 DIAGNOSIS — D638 Anemia in other chronic diseases classified elsewhere: Secondary | ICD-10-CM | POA: Diagnosis present

## 2016-12-05 DIAGNOSIS — F329 Major depressive disorder, single episode, unspecified: Secondary | ICD-10-CM | POA: Diagnosis present

## 2016-12-05 DIAGNOSIS — Z79899 Other long term (current) drug therapy: Secondary | ICD-10-CM

## 2016-12-05 HISTORY — DX: Hb-SS disease with crisis, unspecified: D57.00

## 2016-12-05 LAB — COMPREHENSIVE METABOLIC PANEL
ALT: 24 U/L (ref 14–54)
ANION GAP: 8 (ref 5–15)
AST: 30 U/L (ref 15–41)
Albumin: 4.8 g/dL (ref 3.5–5.0)
Alkaline Phosphatase: 80 U/L (ref 38–126)
BILIRUBIN TOTAL: 2.2 mg/dL — AB (ref 0.3–1.2)
BUN: 5 mg/dL — ABNORMAL LOW (ref 6–20)
CALCIUM: 9.5 mg/dL (ref 8.9–10.3)
CO2: 22 mmol/L (ref 22–32)
Chloride: 110 mmol/L (ref 101–111)
Creatinine, Ser: 0.41 mg/dL — ABNORMAL LOW (ref 0.44–1.00)
Glucose, Bld: 97 mg/dL (ref 65–99)
Potassium: 3.6 mmol/L (ref 3.5–5.1)
SODIUM: 140 mmol/L (ref 135–145)
TOTAL PROTEIN: 7.3 g/dL (ref 6.5–8.1)

## 2016-12-05 LAB — URINALYSIS, ROUTINE W REFLEX MICROSCOPIC
Bacteria, UA: NONE SEEN
Bilirubin Urine: NEGATIVE
GLUCOSE, UA: NEGATIVE mg/dL
Ketones, ur: NEGATIVE mg/dL
Leukocytes, UA: NEGATIVE
Nitrite: NEGATIVE
PROTEIN: NEGATIVE mg/dL
Specific Gravity, Urine: 1.004 — ABNORMAL LOW (ref 1.005–1.030)
pH: 6 (ref 5.0–8.0)

## 2016-12-05 LAB — CBC WITH DIFFERENTIAL/PLATELET
BASOS ABS: 0 10*3/uL (ref 0.0–0.1)
BASOS PCT: 0 %
Eosinophils Absolute: 0.1 10*3/uL (ref 0.0–0.7)
Eosinophils Relative: 1 %
HEMATOCRIT: 22.1 % — AB (ref 36.0–46.0)
Hemoglobin: 7.9 g/dL — ABNORMAL LOW (ref 12.0–15.0)
Lymphocytes Relative: 37 %
Lymphs Abs: 4 10*3/uL (ref 0.7–4.0)
MCH: 38.7 pg — ABNORMAL HIGH (ref 26.0–34.0)
MCHC: 35.7 g/dL (ref 30.0–36.0)
MCV: 108.3 fL — ABNORMAL HIGH (ref 78.0–100.0)
MONOS PCT: 21 %
Monocytes Absolute: 2.3 10*3/uL — ABNORMAL HIGH (ref 0.1–1.0)
NEUTROS ABS: 4.5 10*3/uL (ref 1.7–7.7)
NEUTROS PCT: 41 %
Platelets: 525 10*3/uL — ABNORMAL HIGH (ref 150–400)
RBC: 2.04 MIL/uL — AB (ref 3.87–5.11)
RDW: 18.8 % — ABNORMAL HIGH (ref 11.5–15.5)
WBC: 11 10*3/uL — AB (ref 4.0–10.5)

## 2016-12-05 LAB — RETICULOCYTES: RBC.: 2.04 MIL/uL — AB (ref 3.87–5.11)

## 2016-12-05 LAB — POC URINE PREG, ED: PREG TEST UR: NEGATIVE

## 2016-12-05 MED ORDER — KETOROLAC TROMETHAMINE 30 MG/ML IJ SOLN
30.0000 mg | Freq: Four times a day (QID) | INTRAMUSCULAR | Status: DC | PRN
  Administered 2016-12-05 – 2016-12-09 (×12): 30 mg via INTRAVENOUS
  Filled 2016-12-05 (×12): qty 1

## 2016-12-05 MED ORDER — HYDROMORPHONE HCL 1 MG/ML IJ SOLN
0.5000 mg | INTRAMUSCULAR | Status: AC
Start: 2016-12-05 — End: 2016-12-05

## 2016-12-05 MED ORDER — DIPHENHYDRAMINE HCL 25 MG PO CAPS
25.0000 mg | ORAL_CAPSULE | ORAL | Status: DC | PRN
  Administered 2016-12-05: 25 mg via ORAL
  Filled 2016-12-05 (×2): qty 1

## 2016-12-05 MED ORDER — HYDROMORPHONE HCL 1 MG/ML IJ SOLN
0.5000 mg | Freq: Once | INTRAMUSCULAR | Status: AC
  Administered 2016-12-05: 0.5 mg via SUBCUTANEOUS
  Filled 2016-12-05: qty 0.5

## 2016-12-05 MED ORDER — DIPHENHYDRAMINE HCL 50 MG/ML IJ SOLN
25.0000 mg | INTRAMUSCULAR | Status: DC | PRN
  Filled 2016-12-05: qty 0.5

## 2016-12-05 MED ORDER — HYDROMORPHONE HCL 1 MG/ML IJ SOLN
0.5000 mg | INTRAMUSCULAR | Status: AC
  Administered 2016-12-05: 1 mg via INTRAVENOUS
  Filled 2016-12-05: qty 1

## 2016-12-05 MED ORDER — HYDROMORPHONE HCL 1 MG/ML IJ SOLN
0.5000 mg | INTRAMUSCULAR | Status: AC
Start: 2016-12-05 — End: 2016-12-05
  Filled 2016-12-05: qty 1

## 2016-12-05 MED ORDER — ONDANSETRON HCL 4 MG/2ML IJ SOLN
4.0000 mg | Freq: Four times a day (QID) | INTRAMUSCULAR | Status: DC | PRN
  Administered 2016-12-05 – 2016-12-08 (×6): 4 mg via INTRAVENOUS
  Filled 2016-12-05 (×7): qty 2

## 2016-12-05 MED ORDER — HYDROMORPHONE HCL 1 MG/ML IJ SOLN
0.5000 mg | Freq: Once | INTRAMUSCULAR | Status: AC
  Administered 2016-12-05: 0.5 mg via INTRAVENOUS
  Filled 2016-12-05: qty 0.5

## 2016-12-05 MED ORDER — DEXTROSE-NACL 5-0.45 % IV SOLN
INTRAVENOUS | Status: DC
  Administered 2016-12-05 – 2016-12-07 (×5): via INTRAVENOUS

## 2016-12-05 MED ORDER — DIPHENHYDRAMINE HCL 25 MG PO CAPS
25.0000 mg | ORAL_CAPSULE | ORAL | Status: DC | PRN
  Administered 2016-12-06 – 2016-12-08 (×2): 25 mg via ORAL
  Filled 2016-12-05: qty 1

## 2016-12-05 MED ORDER — KETOROLAC TROMETHAMINE 15 MG/ML IJ SOLN
15.0000 mg | INTRAMUSCULAR | Status: AC
  Administered 2016-12-05: 15 mg via INTRAVENOUS
  Filled 2016-12-05: qty 1

## 2016-12-05 MED ORDER — HYDROMORPHONE HCL 1 MG/ML IJ SOLN: 0.5000 mg | INTRAMUSCULAR | Status: AC

## 2016-12-05 MED ORDER — SODIUM CHLORIDE 0.9% FLUSH: 9.0000 mL | INTRAVENOUS | Status: DC | PRN

## 2016-12-05 MED ORDER — HYDROXYUREA 500 MG PO CAPS
1500.0000 mg | ORAL_CAPSULE | Freq: Every day | ORAL | Status: DC
  Administered 2016-12-05 – 2016-12-09 (×5): 1500 mg via ORAL
  Filled 2016-12-05 (×5): qty 3

## 2016-12-05 MED ORDER — ONDANSETRON HCL 4 MG/2ML IJ SOLN
4.0000 mg | Freq: Once | INTRAMUSCULAR | Status: AC
  Administered 2016-12-05: 4 mg via INTRAVENOUS
  Filled 2016-12-05: qty 2

## 2016-12-05 MED ORDER — HYDROMORPHONE HCL 1 MG/ML IJ SOLN
0.5000 mg | INTRAMUSCULAR | Status: AC
  Administered 2016-12-05: 1 mg via INTRAVENOUS

## 2016-12-05 MED ORDER — ONDANSETRON 4 MG PO TBDP
4.0000 mg | ORAL_TABLET | Freq: Once | ORAL | Status: AC
  Administered 2016-12-05: 4 mg via ORAL
  Filled 2016-12-05: qty 1

## 2016-12-05 MED ORDER — NALOXONE HCL 0.4 MG/ML IJ SOLN: 0.4000 mg | INTRAMUSCULAR | Status: DC | PRN

## 2016-12-05 MED ORDER — HYDROMORPHONE 1 MG/ML IV SOLN
INTRAVENOUS | Status: DC
  Administered 2016-12-05: 6 mg via INTRAVENOUS
  Administered 2016-12-05: 11:00:00 via INTRAVENOUS
  Administered 2016-12-06: 7.2 mg via INTRAVENOUS
  Administered 2016-12-06: 3.59 mg via INTRAVENOUS
  Administered 2016-12-06: 02:00:00 via INTRAVENOUS
  Administered 2016-12-06: 4.8 mg via INTRAVENOUS
  Administered 2016-12-06: 6 mg via INTRAVENOUS
  Administered 2016-12-06: 8.4 mg via INTRAVENOUS
  Administered 2016-12-06: 3 mg via INTRAVENOUS
  Administered 2016-12-07 (×2): 7.8 mg via INTRAVENOUS
  Administered 2016-12-07: 25 mg via INTRAVENOUS
  Administered 2016-12-07: 1.8 mg via INTRAVENOUS
  Administered 2016-12-07: 0.6 mg via INTRAVENOUS
  Administered 2016-12-07: 1.8 mg via INTRAVENOUS
  Administered 2016-12-07: 7 mg via INTRAVENOUS
  Administered 2016-12-08: 1.8 mg via INTRAVENOUS
  Administered 2016-12-08: 8.4 mg via INTRAVENOUS
  Administered 2016-12-08: 11:00:00 via INTRAVENOUS
  Administered 2016-12-08: 3.6 mg via INTRAVENOUS
  Administered 2016-12-08: 1.8 mg via INTRAVENOUS
  Administered 2016-12-08: 13.8 mg via INTRAVENOUS
  Administered 2016-12-09: 3.6 mg via INTRAVENOUS
  Administered 2016-12-09: 4.2 mg via INTRAVENOUS
  Administered 2016-12-09: 6 mg via INTRAVENOUS
  Administered 2016-12-09: 02:00:00 via INTRAVENOUS
  Administered 2016-12-09: 3.6 mg via INTRAVENOUS
  Filled 2016-12-05 (×6): qty 25

## 2016-12-05 MED ORDER — FOLIC ACID 1 MG PO TABS
1.0000 mg | ORAL_TABLET | Freq: Every day | ORAL | Status: DC
  Administered 2016-12-05 – 2016-12-08 (×4): 1 mg via ORAL
  Filled 2016-12-05 (×4): qty 1

## 2016-12-05 MED ORDER — ENOXAPARIN SODIUM 40 MG/0.4ML ~~LOC~~ SOLN
40.0000 mg | SUBCUTANEOUS | Status: DC
  Administered 2016-12-05 – 2016-12-08 (×4): 40 mg via SUBCUTANEOUS
  Filled 2016-12-05 (×5): qty 0.4

## 2016-12-05 NOTE — H&P (Signed)
H&P   Patient Demographics:    Sandra Mckay, is a 21 y.o. female  MRN: [id number]   DOB - 09/24/1995  Admit Date - 12/05/2016  Outpatient Primary MD for the patient is Sandra Dew, FNP  Chief Complaint  Patient presents with  . Sickle Cell Pain Crisis     HPI:    Sandra Mckay  is a 21 y.o. female with a history of sickle cell anemia, HbSS who presented to the Emergency Department complaining of gradually worsening pain that started this morning, similar to her typical sickle cell pain crisis. Pt reports associated subjective fever, fatigue, back pain, BUE and BLE pain. Pt has tried home prescription medications with no relief of her symptoms. She states that she takes her pain medications up to three times a week. She rated her pain at 8/10. She denies vomiting, cough, Chest Pain, SOB.  In the ED, patient was given multiple rounds of pain medications IV, but pain persists. Hb is at baseline, 7.9, mild leukocytosis, CMP was normal. Patient is being admitted for acute sickle cell pain crisis.   Review of systems:    In addition to the HPI above, patient reports No Fever, chills No Headache, No changes with Vision or hearing No problems swallowing food or Liquids No Chest pain, Cough or Shortness of Breath No Abdominal pain, No Nausea or Vomiting, Bowel movements are regular No Blood in stool or Urine, No dysuria No new skin rashes or bruises No new joints pains-aches No new weakness, tingling, numbness in any extremity No recent weight gain or loss No polyuria, polydypsia or polyphagia No significant Mental Stressors.  A full 10 point Review of Systems was done, except as stated above, all other Review of Systems were negative.  With Past History of the following :   Past Medical History:  Diagnosis Date  . Blood transfusion without reported diagnosis   . Bronchitis   . Chickenpox   . Depression   . Heart murmur   . Sickle cell anemia (HCC)   . Urinary tract  infection       Past Surgical History:  Procedure Laterality Date  . CHOLECYSTECTOMY  2011  . EYE SURGERY     Sty removal  . LABIAL ADHESION LYSIS  1999  . 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 TONSILLECTOMY  2012    Social History:   Social History  Substance Use Topics  . Smoking status: Former Research scientist (life sciences)  . Smokeless tobacco: Never Used  . Alcohol use 0.0 oz/week     Comment: occ    Lives - At home   Family History :   Family History  Problem Relation Age of Onset  . Arthritis Other     grandparent  . Stroke Other   . Hypertension Other   . Diabetes      grandparent  . Cancer - Other      Glioblastoma    Home Medications:   Prior to Admission medications   Medication Sig Start Date End Date Taking? Authorizing Provider  DROXIA 300 MG capsule TAKE 1 CAPSULE (300 MG TOTAL) BY MOUTH DAILY. 10/17/15  Yes Sandra Dew, FNP  folic acid (FOLVITE) 1 MG tablet Take 1 tablet (1 mg total) by mouth daily. 12/17/15  Yes Sandra Blanda E, MD  hydroxyurea (HYDREA) 500 MG capsule TAKE 3 CAPSULES (1,500 MG TOTAL) BY MOUTH DAILY. 08/10/16  Yes Sandra Dew, FNP  ondansetron [facility]) 4  MG tablet Take 1 tablet (4 mg total) by mouth every 8 (eight) hours as needed for nausea or vomiting. 09/09/15  Yes Sandra Dew, FNP  Oxycodone HCl 10 MG TABS Take 1 tablet (10 mg total) by mouth every 6 (six) hours as needed. Patient taking differently: Take 10 mg by mouth every 6 (six) hours as needed (pain).  11/20/16  Yes Sandra Dew, FNP  ibuprofen (ADVIL,MOTRIN) 600 MG tablet Take 1 tablet (600 mg total) by mouth every 8 (eight) hours as needed for moderate pain. Patient not taking: Reported on 12/05/2016 12/17/15   Sandra Garter, MD  Vitamin D, Ergocalciferol, (DRISDOL) 50000 units CAPS capsule Take 1 capsule (50,000 Units total) by mouth every Saturday. Saturday. Patient not taking: Reported on 12/05/2016 11/28/16   Sandra Dew, FNP     Allergies:    No Known Allergies   Physical Exam:  Vitals:  Vitals:   12/05/16 0704 12/05/16 0730  BP: 102/60 122/82  Pulse: 62 70  Resp: 18 (!) 29  Temp:     Physical Exam: Constitutional: Patient appears well-developed and well-nourished. Not in obvious distress. HENT: Normocephalic, atraumatic, External right and left ear normal. Oropharynx is clear and moist.  Eyes: Conjunctivae and EOM are normal. PERRLA, no scleral icterus. Neck: Normal ROM. Neck supple. No JVD. No tracheal deviation. No thyromegaly. CVS: RRR, A3/F5 +, + systolic murmurs, no gallops, no carotid bruit.  Pulmonary: Effort and breath sounds normal, no stridor, rhonchi, wheezes, rales.  Abdominal: Soft. BS +, no distension, tenderness, rebound or guarding.  Musculoskeletal: Normal range of motion. No edema and no tenderness.  Lymphadenopathy: No lymphadenopathy noted, cervical, inguinal or axillary Neuro: Alert. Normal reflexes, muscle tone coordination. No cranial nerve deficit. Skin: Skin is warm and dry. No rash noted. Not diaphoretic. No erythema. No pallor. Psychiatric: Normal mood and affect. Behavior, judgment, thought content normal.   Data Review:    CBC  Recent Labs Lab 11/30/16 1204 12/05/16 0309  WBC 12.2* 11.0*  HGB 7.4* 7.9*  HCT 20.5* 22.1*  PLT 462* 525*  MCV 107.3* 108.3*  MCH 38.7* 38.7*  MCHC 36.1* 35.7  RDW 22.3* 18.8*  LYMPHSABS 2.6 4.0  MONOABS 2.6* 2.3*  EOSABS 0.1 0.1  BASOSABS 0.1 0.0   ------------------------------------------------------------------------------------------------------------------  Chemistries   Recent Labs Lab 11/30/16 1204 12/05/16 0309  NA 136 140  K 4.0 3.6  CL 105 110  CO2 23 22  GLUCOSE 97 97  BUN 9 5*  CREATININE 0.41* 0.41*  CALCIUM 9.5 9.5  AST 59* 30  ALT 30 24  ALKPHOS 96 80  BILITOT 4.2* 2.2*   ------------------------------------------------------------------------------------------------------------------ estimated  creatinine clearance is 86.5 mL/min (A) (by C-G formula based on SCr of 0.41 mg/dL (L)). ------------------------------------------------------------------------------------------------------------------ No results for input(s): TSH, T4TOTAL, T3FREE, THYROIDAB in the last 72 hours.  Invalid input(s): FREET3  Coagulation profile No results for input(s): INR, PROTIME in the last 168 hours. ------------------------------------------------------------------------------------------------------------------- No results for input(s): DDIMER in the last 72 hours. -------------------------------------------------------------------------------------------------------------------  Cardiac Enzymes No results for input(s): CKMB, TROPONINI, MYOGLOBIN in the last 168 hours.  Invalid input(s): CK ------------------------------------------------------------------------------------------------------------------ No results found for: BNP  ---------------------------------------------------------------------------------------------------------------  Urinalysis    Component Value Date/Time   COLORURINE YELLOW 12/05/2016 [place] 12/05/2016 0337   LABSPEC 1.004 (L) 12/05/2016 0337   PHURINE 6.0 12/05/2016 [place] 12/05/2016 0337   HGBUR MODERATE (A) 12/05/2016 [place] NEGATIVE 12/05/2016 [place] 12/05/2016 7322  PROTEINUR NEGATIVE 12/05/2016 0337   UROBILINOGEN 2.0 (H) 11/20/2016 0914   NITRITE NEGATIVE 12/05/2016 0337   LEUKOCYTESUR NEGATIVE 12/05/2016 4784   ------------------------------------------------------------------------------------------------------------   Imaging Results:    Assessment & Plan:    Active Problems:   Sickle cell pain crisis (HCC)   Sickle cell anemia with crisis (HCC)  Sickle Cell Anemia with Crisis   IV Fluid D5 .45% @ 125 cc/ hr  IV Dilaudid via PCA, weight based  IV Toradol 30 mg Q 6  H  Benadryl, Zofran and other adjunct therapies  Restart home medications  Labs in am  DVT Prophylaxis: Subcut Lovenox   AM Labs Ordered, also please review Full Orders  Family Communication: Admission, patient's condition and plan of care including tests being ordered have been discussed with the patient who indicate understanding and agree with the plan and Code Status.  Code Status: Full Code  Consults called: None    Admission status: Inpatient    Time spent in minutes : 50 minutes  Feiga Nadel MD, MHA, CPE, FACP 12/05/2016 at 9:18 AM

## 2016-12-05 NOTE — ED Provider Notes (Signed)
[place] DEPT Provider Note   CSN: [id number] Arrival date & time: 12/05/16  0112   By signing my name below, I, Soijett Blue, attest that this documentation has been prepared under the direction and in the presence of Ripley Fraise, MD. Electronically Signed: Soijett Blue, ED Scribe. 12/05/16. 3:46 AM.  History   Chief Complaint Chief Complaint  Patient presents with  . Sickle Cell Pain Crisis    HPI Sandra Mckay is a 21 y.o. female with a PMHx of sickle cell anemia, who presents to the Emergency Department complaining of gradually worsening sickle cell pain crisis onset this morning. Pt reports associated subjective fever, fatigue, back pain, BUE and BLE pain. Pt has tried home prescription medications with no relief of her symptoms. She states that she takes her pain medications up to three times a week. Pt PCP is Dr. Smith Robert at [facility] and she has a follow up appointment in 2 days. She denies vomiting, cough, CP, and any other symptoms.      The history is provided by the patient. No language interpreter was used.  Sickle Cell Pain Crisis  Location:  Back, lower extremity and upper extremity Severity:  Moderate Onset quality:  Gradual Duration:  1 day Similar to previous crisis episodes: yes   Timing:  Constant Progression:  Worsening Chronicity:  Recurrent Relieved by:  Nothing Worsened by:  Nothing Ineffective treatments:  Prescription drugs Associated symptoms: fatigue and fever   Associated symptoms: no chest pain, no cough and no vomiting     Past Medical History:  Diagnosis Date  . Blood transfusion without reported diagnosis   . Bronchitis   . Chickenpox   . Depression   . Heart murmur   . Sickle cell anemia (HCC)   . Urinary tract infection     Patient Active Problem List   Diagnosis Date Noted  . Vasovagal syncope 09/18/2016  . Closed fracture of lumbar vertebra without spinal cord injury ([place]) 07/15/2016  . Nausea without  vomiting 09/09/2015  . Vitamin D deficiency 07/22/2015  . Sickle cell disease ([place]) 06/08/2015    Past Surgical History:  Procedure Laterality Date  . CHOLECYSTECTOMY  2011  . EYE SURGERY     Sty removal  . LABIAL ADHESION LYSIS  1999  . 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 TONSILLECTOMY  2012    OB History    No data available       Home Medications    Prior to Admission medications   Medication Sig Start Date End Date Taking? Authorizing Provider  DROXIA 300 MG capsule TAKE 1 CAPSULE (300 MG TOTAL) BY MOUTH DAILY. 10/17/15   Dorena Dew, FNP  folic acid (FOLVITE) 1 MG tablet Take 1 tablet (1 mg total) by mouth daily. 12/17/15   Tresa Garter, MD  hydroxyurea (HYDREA) 500 MG capsule TAKE 3 CAPSULES (1,500 MG TOTAL) BY MOUTH DAILY. 08/10/16   Dorena Dew, FNP  ibuprofen (ADVIL,MOTRIN) 600 MG tablet Take 1 tablet (600 mg total) by mouth every 8 (eight) hours as needed for moderate pain. 12/17/15   Tresa Garter, MD  ondansetron (ZOFRAN) 4 MG tablet Take 1 tablet (4 mg total) by mouth every 8 (eight) hours as needed for nausea or vomiting. 09/09/15   Dorena Dew, FNP  Oxcarbazepine (TRILEPTAL) 300 MG tablet Take 300 mg by mouth 2 (two) times daily. Reported on 10/27/2015    [provider]  Oxycodone  HCl 10 MG TABS Take 1 tablet (10 mg total) by mouth every 6 (six) hours as needed. 11/20/16   Dorena Dew, FNP  pantoprazole (PROTONIX) 20 MG tablet TAKE 1 TABLET DAILY 08/10/16   Dorena Dew, FNP  Vitamin D, Ergocalciferol, (DRISDOL) 50000 units CAPS capsule Take 1 capsule (50,000 Units total) by mouth every Saturday. Saturday. 11/28/16   Dorena Dew, FNP    Family History Family History  Problem Relation Age of Onset  . Arthritis Other     grandparent  . Stroke Other   . Hypertension Other   . Diabetes      grandparent  . Cancer - Other      Glioblastoma    Social History Social History    Substance Use Topics  . Smoking status: Former Research scientist (life sciences)  . Smokeless tobacco: Never Used  . Alcohol use 0.0 oz/week     Comment: occ     Allergies   Patient has no known allergies.   Review of Systems Review of Systems  Constitutional: Positive for fatigue and fever.  Respiratory: Negative for cough.   Cardiovascular: Negative for chest pain.  Gastrointestinal: Negative for vomiting.  Musculoskeletal: Positive for back pain and myalgias (BUE and BLE).  All other systems reviewed and are negative.    Physical Exam Updated Vital Signs BP 116/71 (BP Location: Left Arm)   Pulse 70   Temp 99 F (37.2 C) (Oral)   Resp 18   Ht 5\' 2"  (1.575 m)   Wt 125 lb (56.7 kg)   SpO2 97%   BMI 22.86 kg/m   Physical Exam CONSTITUTIONAL: Well developed/well nourished, uncomfortable appearing HEAD: Normocephalic/atraumatic EYES: EOMI ENMT: Mucous membranes moist NECK: supple no meningeal signs SPINE/BACK:entire spine nontender CV: S1/S2 noted, no murmurs/rubs/gallops noted LUNGS: Lungs are clear to auscultation bilaterally, no apparent distress ABDOMEN: soft, nontender GU:no cva tenderness NEURO: Pt is awake/alert/appropriate, moves all extremitiesx4.  No facial droop.  No focal weakness EXTREMITIES: pulses normal/equal, full ROM SKIN: warm, color normal PSYCH: no abnormalities of mood noted, alert and oriented to situation  ED Treatments / Results  DIAGNOSTIC STUDIES: Oxygen Saturation is 97% on RA, nl by my interpretation.    COORDINATION OF CARE: 3:38 AM Discussed treatment plan with pt at bedside which includes labs and pt agreed to plan.   Labs (all labs ordered are listed, but only abnormal results are displayed) Labs Reviewed  CBC WITH DIFFERENTIAL/PLATELET - Abnormal; Notable for the following:       Result Value   WBC 11.0 (*)    RBC 2.04 (*)    Hemoglobin 7.9 (*)    HCT 22.1 (*)    MCV 108.3 (*)    MCH 38.7 (*)    RDW 18.8 (*)    Platelets 525 (*)     Monocytes Absolute 2.3 (*)    All other components within normal limits  RETICULOCYTES - Abnormal; Notable for the following:    Retic Ct Pct >23.0 (*)    RBC. 2.04 (*)    All other components within normal limits  COMPREHENSIVE METABOLIC PANEL  URINALYSIS, ROUTINE W REFLEX MICROSCOPIC  POC URINE PREG, ED    EKG  EKG Interpretation None       Radiology No results found.  Procedures Procedures (including critical care time)  Medications Ordered in ED Medications  diphenhydrAMINE (BENADRYL) capsule 25-50 mg (25 mg Oral Given 12/05/16 0343)  HYDROmorphone (DILAUDID) injection 0.5 mg (0.5 mg Subcutaneous Given 12/05/16 0343)  ondansetron (ZOFRAN-ODT)  disintegrating tablet 4 mg (4 mg Oral Given 12/05/16 0310)  ketorolac (TORADOL) 15 MG/ML injection 15 mg (15 mg Intravenous Given 12/05/16 0343)  HYDROmorphone (DILAUDID) injection 0.5-1 mg (1 mg Intravenous Given 12/05/16 0419)    Or  HYDROmorphone (DILAUDID) injection 0.5-1 mg ( Subcutaneous See Alternative 12/05/16 0419)  HYDROmorphone (DILAUDID) injection 0.5-1 mg (1 mg Intravenous Given 12/05/16 0500)    Or  HYDROmorphone (DILAUDID) injection 0.5-1 mg ( Subcutaneous See Alternative 12/05/16 0500)  HYDROmorphone (DILAUDID) injection 0.5-1 mg (1 mg Intravenous Given 12/05/16 0531)    Or  HYDROmorphone (DILAUDID) injection 0.5-1 mg ( Subcutaneous See Alternative 12/05/16 0531)  ondansetron (ZOFRAN) injection 4 mg (4 mg Intravenous Given 12/05/16 0506)  HYDROmorphone (DILAUDID) injection 0.5 mg (0.5 mg Intravenous Given 12/05/16 0644)     Initial Impression / Assessment and Plan / ED Course  I have reviewed the triage vital signs and the nursing notes.  Pertinent labsresults that were available during my care of the patient were reviewed by me and considered in my medical decision making (see chart for details).    7:00 AM Pt in the ED for sickle pain crisis, similar to prior episodes She is awake/alert, afebrile, no CP reported After multiple  rounds of pain meds, pt still having pain Labs near baseline Will admit D/w dr Alcario Drought for admission Pt agreeable   Final Clinical Impressions(s) / ED Diagnoses   Final diagnoses:  Sickle cell crisis ([place])  Sickle cell pain crisis ([place])    New Prescriptions New Prescriptions   No medications on file   I personally performed the services described in this documentation, which was scribed in my presence. The recorded information has been reviewed and is accurate.        Ripley Fraise, MD 12/05/16 0700

## 2016-12-05 NOTE — ED Notes (Signed)
Report given-transfer to 3rd floor

## 2016-12-05 NOTE — ED Triage Notes (Signed)
Patient c/o back, arm, and leg pain. States it worsened as she woke up this morning and she tried to control at home with medications however, pain is worsening.

## 2016-12-05 NOTE — ED Notes (Signed)
Bed: NT61 Expected date:  Expected time:  Means of arrival:  Comments: [place]

## 2016-12-06 LAB — COMPREHENSIVE METABOLIC PANEL
ALK PHOS: 77 U/L (ref 38–126)
ALT: 48 U/L (ref 14–54)
AST: 58 U/L — ABNORMAL HIGH (ref 15–41)
Albumin: 4.2 g/dL (ref 3.5–5.0)
Anion gap: 4 — ABNORMAL LOW (ref 5–15)
BUN: <5 mg/dL — ABNORMAL LOW (ref 6–20)
CALCIUM: 8.9 mg/dL (ref 8.9–10.3)
CO2: 26 mmol/L (ref 22–32)
CREATININE: 0.35 mg/dL — AB (ref 0.44–1.00)
Chloride: 108 mmol/L (ref 101–111)
GFR calc non Af Amer: >60 mL/min (ref >60)
GLUCOSE: 106 mg/dL — AB (ref 65–99)
Potassium: 3.7 mmol/L (ref 3.5–5.1)
SODIUM: 138 mmol/L (ref 135–145)
Total Bilirubin: 3.5 mg/dL — ABNORMAL HIGH (ref 0.3–1.2)
Total Protein: 6.2 g/dL — ABNORMAL LOW (ref 6.5–8.1)

## 2016-12-06 LAB — CBC
HCT: 18.2 % — ABNORMAL LOW (ref 36.0–46.0)
HCT: 17.8 % — ABNORMAL LOW (ref 36.0–46.0)
Hemoglobin: 6.6 g/dL — CL (ref 12.0–15.0)
Hemoglobin: 6.6 g/dL — CL (ref 12.0–15.0)
MCH: 38.4 pg — AB (ref 26.0–34.0)
MCH: 38.8 pg — ABNORMAL HIGH (ref 26.0–34.0)
MCHC: 36.3 g/dL — AB (ref 30.0–36.0)
MCHC: 37.1 g/dL — ABNORMAL HIGH (ref 30.0–36.0)
MCV: 105.8 fL — ABNORMAL HIGH (ref 78.0–100.0)
MCV: 104.7 fL — ABNORMAL HIGH (ref 78.0–100.0)
PLATELETS: 442 10*3/uL — AB (ref 150–400)
PLATELETS: 439 10*3/uL — AB (ref 150–400)
RBC: 1.72 MIL/uL — AB (ref 3.87–5.11)
RBC: 1.7 MIL/uL — ABNORMAL LOW (ref 3.87–5.11)
RDW: 19.3 % — ABNORMAL HIGH (ref 11.5–15.5)
RDW: 19.7 % — AB (ref 11.5–15.5)
WBC: 8.8 10*3/uL (ref 4.0–10.5)
WBC: 8.3 10*3/uL (ref 4.0–10.5)

## 2016-12-06 MED ORDER — ALUM & MAG HYDROXIDE-SIMETH 200-200-20 MG/5ML PO SUSP: 30.0000 mL | ORAL | Status: DC | PRN

## 2016-12-06 MED ORDER — OXYCODONE HCL 5 MG PO TABS
10.0000 mg | ORAL_TABLET | Freq: Four times a day (QID) | ORAL | Status: DC | PRN
  Administered 2016-12-06 – 2016-12-07 (×4): 10 mg via ORAL
  Filled 2016-12-06 (×5): qty 2

## 2016-12-06 MED ORDER — FAMOTIDINE 20 MG PO TABS
20.0000 mg | ORAL_TABLET | Freq: Two times a day (BID) | ORAL | Status: DC
  Administered 2016-12-07 – 2016-12-09 (×7): 20 mg via ORAL
  Filled 2016-12-06 (×6): qty 1

## 2016-12-06 NOTE — Progress Notes (Signed)
Patient ID: Sandra Mckay, female   DOB: 1996-04-15, 21 y.o.   MRN: [id number] Subjective:  Patient is still having some pains in her lower limbs, rated at 7/10 today, slightly better than yesterday. She denies fever, no headache, no vomiting but has nausea. Denies chest pain, no SOB.  Objective:  Vital signs in last 24 hours:  Vitals:   12/06/16 0451 12/06/16 0800 12/06/16 1232 12/06/16 1339  BP: (!) 91/43   125/77  Pulse: 63   76  Resp: 11 13 12 16   Temp: 97.9 F (36.6 C)   97.9 F (36.6 C)  TempSrc: Oral   Oral  SpO2: 94% 92% (!) 84% 94%  Weight: 57.8 kg (127 lb 6.8 oz)     Height:       Intake/Output from previous day:   Intake/Output Summary (Last 24 hours) at 12/06/16 1359 Last data filed at 12/06/16 0700  Gross per 24 hour  Intake          2815.83 ml  Output                0 ml  Net          2815.83 ml   Physical Exam: General: Alert, awake, oriented x3, in no acute distress.  HEENT: [place]/AT PEERL, EOMI Neck: Trachea midline,  no masses, no thyromegal,y no JVD, no carotid bruit OROPHARYNX:  Moist, No exudate/ erythema/lesions.  Heart: Regular rate and rhythm, without murmurs, rubs, gallops, PMI non-displaced, no heaves or thrills on palpation.  Lungs: Clear to auscultation, no wheezing or rhonchi noted. No increased vocal fremitus resonant to percussion  Abdomen: Soft, nontender, nondistended, positive bowel sounds, no masses no hepatosplenomegaly noted..  Neuro: No focal neurological deficits noted cranial nerves II through XII grossly intact. DTRs 2+ bilaterally upper and lower extremities. Strength 5 out of 5 in bilateral upper and lower extremities. Musculoskeletal: No warm swelling or erythema around joints, no spinal tenderness noted. Psychiatric: Patient alert and oriented x3, good insight and cognition, good recent to remote recall. Lymph node survey: No cervical axillary or inguinal lymphadenopathy noted.  Lab Results:  Basic Metabolic Panel:     Component Value Date/Time   NA 138 12/06/2016 0407   K 3.7 12/06/2016 0407   CL 108 12/06/2016 0407   CO2 26 12/06/2016 0407   BUN <5 (L) 12/06/2016 0407   CREATININE 0.35 (L) 12/06/2016 0407   CREATININE 0.50 11/20/2016 0913   GLUCOSE 106 (H) 12/06/2016 0407   CALCIUM 8.9 12/06/2016 0407   CBC:    Component Value Date/Time   WBC 8.3 12/06/2016 1141   HGB 6.6 (LL) 12/06/2016 1141   HCT 17.8 (L) 12/06/2016 1141   PLT 439 (H) 12/06/2016 1141   MCV 104.7 (H) 12/06/2016 1141   NEUTROABS 4.5 12/05/2016 0309   LYMPHSABS 4.0 12/05/2016 0309   MONOABS 2.3 (H) 12/05/2016 0309   EOSABS 0.1 12/05/2016 0309   BASOSABS 0.0 12/05/2016 0309   No results found for this or any previous visit (from the past 240 hour(s)).  Studies/Results: No results found.  Medications: Scheduled Meds: . enoxaparin (LOVENOX) injection  40 mg Subcutaneous Q24H  . folic acid  1 mg Oral Daily  . HYDROmorphone   Intravenous Q4H  . hydroxyurea  1,500 mg Oral Daily   Continuous Infusions: . dextrose 5 % and 0.45% NaCl 125 mL/hr at 12/06/16 0156  . diphenhydrAMINE (BENADRYL) IVPB(SICKLE CELL ONLY)     Consultants:  None  Procedures:  None  Antibiotics:  None  Assessment/Plan: Active Problems:   Sickle cell pain crisis (HCC)   Sickle cell anemia with crisis ([place])  1. Hb SS with crisis: Will continue Dilaudid PCA, Toradol. Decrease IVF to 75 cc/Hr. Add home medication Oxycodone for breakthrough pain 2. Mild Leukocytosis: Resolved, likely related to crisis. 3. Anemia: Hb dropped to 6.6 today from 7.9 yesterday. Patient will be transfused if Hb dropped below 6. Will continue to monitor, she is asymptomatic 4. Chronic pain: Continue pain medication  Code Status: Full Code Family Communication: N/A Disposition Plan: Not yet ready for discharge  Dorenda Pfannenstiel  If 7PM-7AM, please contact night-coverage.  12/06/2016, 1:59 PM  LOS: 1 day

## 2016-12-06 NOTE — Progress Notes (Signed)
1323-Salay- Courtsey paged regarding critical lab, Hemoglobin 6.6. Thanks   MD. Georges Mouse paged regarding

## 2016-12-06 NOTE — Progress Notes (Signed)
Asked Dr Doreene Burke if patient can have Oxycodone 10 mg for break through pain. He said he would write the order.

## 2016-12-06 NOTE — Progress Notes (Signed)
Texted Dr. Doreene Burke for order for Oxycodone for break through pain.

## 2016-12-06 NOTE — Progress Notes (Signed)
Pt ambulating to the bathroom, tolerating well. Pain came down 1 point from 7 to 6 after receiving Oxycodone.

## 2016-12-07 DIAGNOSIS — K5900 Constipation, unspecified: Secondary | ICD-10-CM

## 2016-12-07 DIAGNOSIS — D638 Anemia in other chronic diseases classified elsewhere: Secondary | ICD-10-CM

## 2016-12-07 DIAGNOSIS — D5700 Hb-SS disease with crisis, unspecified: Principal | ICD-10-CM

## 2016-12-07 LAB — CBC WITH DIFFERENTIAL/PLATELET
BASOS ABS: 0.1 10*3/uL (ref 0.0–0.1)
Basophils Relative: 1 %
EOS ABS: 0.2 10*3/uL (ref 0.0–0.7)
EOS PCT: 2 %
HEMATOCRIT: 16.7 % — AB (ref 36.0–46.0)
Hemoglobin: 6.3 g/dL — CL (ref 12.0–15.0)
LYMPHS ABS: 4.2 10*3/uL — AB (ref 0.7–4.0)
Lymphocytes Relative: 41 %
MCH: 39.4 pg — ABNORMAL HIGH (ref 26.0–34.0)
MCHC: 37.7 g/dL — AB (ref 30.0–36.0)
MCV: 104.4 fL — ABNORMAL HIGH (ref 78.0–100.0)
MONO ABS: 1.8 10*3/uL — AB (ref 0.1–1.0)
Monocytes Relative: 17 %
NEUTROS ABS: 4 10*3/uL (ref 1.7–7.7)
Neutrophils Relative %: 39 %
PLATELETS: 419 10*3/uL — AB (ref 150–400)
RBC: 1.6 MIL/uL — AB (ref 3.87–5.11)
RDW: 19.8 % — AB (ref 11.5–15.5)
WBC: 10.3 10*3/uL (ref 4.0–10.5)
nRBC: 2 /100 WBC — ABNORMAL HIGH

## 2016-12-07 LAB — TYPE AND SCREEN
ABO/RH(D): A POS
ANTIBODY SCREEN: NEGATIVE

## 2016-12-07 LAB — RETICULOCYTES
RBC.: 1.6 MIL/uL — AB (ref 3.87–5.11)
Retic Ct Pct: >23 % — ABNORMAL HIGH (ref 0.4–3.1)

## 2016-12-07 LAB — ABO/RH: ABO/RH(D): A POS

## 2016-12-07 LAB — LACTATE DEHYDROGENASE: LDH: 401 U/L — ABNORMAL HIGH (ref 98–192)

## 2016-12-07 MED ORDER — SODIUM CHLORIDE 0.9 % IV SOLN
Freq: Once | INTRAVENOUS | Status: AC
  Administered 2016-12-07: 16:00:00 via INTRAVENOUS

## 2016-12-07 MED ORDER — OXYCODONE HCL 5 MG PO TABS
10.0000 mg | ORAL_TABLET | ORAL | Status: DC
  Administered 2016-12-07 – 2016-12-09 (×11): 10 mg via ORAL
  Filled 2016-12-07 (×11): qty 2

## 2016-12-07 MED ORDER — PROMETHAZINE HCL 25 MG/ML IJ SOLN
12.5000 mg | Freq: Once | INTRAMUSCULAR | Status: AC
  Administered 2016-12-07: 12.5 mg via INTRAVENOUS
  Filled 2016-12-07: qty 1

## 2016-12-07 NOTE — Progress Notes (Signed)
Pt refused to walk with the tech to see what her O2 sats would be.

## 2016-12-08 ENCOUNTER — Inpatient Hospital Stay (HOSPITAL_COMMUNITY): Payer: 59

## 2016-12-08 DIAGNOSIS — R0902 Hypoxemia: Secondary | ICD-10-CM

## 2016-12-08 LAB — CBC WITH DIFFERENTIAL/PLATELET
BASOS ABS: 0 10*3/uL (ref 0.0–0.1)
Basophils Relative: 0 %
EOS ABS: 0.2 10*3/uL (ref 0.0–0.7)
Eosinophils Relative: 2 %
HCT: 18.9 % — ABNORMAL LOW (ref 36.0–46.0)
HEMOGLOBIN: 6.8 g/dL — AB (ref 12.0–15.0)
LYMPHS PCT: 36 %
Lymphs Abs: 2.9 10*3/uL (ref 0.7–4.0)
MCH: 38.6 pg — ABNORMAL HIGH (ref 26.0–34.0)
MCHC: 36 g/dL (ref 30.0–36.0)
MCV: 107.4 fL — ABNORMAL HIGH (ref 78.0–100.0)
MONOS PCT: 17 %
Monocytes Absolute: 1.4 10*3/uL — ABNORMAL HIGH (ref 0.1–1.0)
NEUTROS ABS: 3.6 10*3/uL (ref 1.7–7.7)
Neutrophils Relative %: 45 %
Platelets: 458 10*3/uL — ABNORMAL HIGH (ref 150–400)
RBC: 1.76 MIL/uL — AB (ref 3.87–5.11)
RDW: 18.6 % — ABNORMAL HIGH (ref 11.5–15.5)
WBC: 8.1 10*3/uL (ref 4.0–10.5)
nRBC: 1 /100 WBC — ABNORMAL HIGH

## 2016-12-08 LAB — RETICULOCYTES: RBC.: 1.76 MIL/uL — AB (ref 3.87–5.11)

## 2016-12-08 MED ORDER — FOLIC ACID 1 MG PO TABS
2.0000 mg | ORAL_TABLET | Freq: Every day | ORAL | Status: DC
  Administered 2016-12-09: 2 mg via ORAL
  Filled 2016-12-08: qty 2
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
## 2016-12-08 NOTE — Progress Notes (Signed)
NT ambulated patient in hallway. Patient on RA and at rest had an 02 Sat of 98 percent.  Upon ambulation, 02 sat dropped as low as 73 percent on RA.  HR remained the same at rest and upon ambulation (around 94 bpm). Roderick Pee
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
## 2016-12-21 ENCOUNTER — Telehealth: Payer: Self-pay

## 2016-12-21 ENCOUNTER — Other Ambulatory Visit: Payer: Self-pay | Admitting: Family Medicine

## 2016-12-21 DIAGNOSIS — D571 Sickle-cell disease without crisis: Secondary | ICD-10-CM

## 2016-12-21 MED ORDER — OXYCODONE HCL 10 MG PO TABS: 10.0000 mg | ORAL_TABLET | Freq: Four times a day (QID) | ORAL | 0 refills | Status: DC | PRN

## 2016-12-21 NOTE — Progress Notes (Signed)
Reviewed [place] Substance Reporting system prior to prescribing opiate medications. No inconsistencies noted.    Meds ordered this encounter  Medications  . Oxycodone HCl 10 MG TABS    Sig: Take 1 tablet (10 mg total) by mouth every 6 (six) hours as needed.    Dispense:  60 tablet    Refill:  0    Order Specific Question:   Supervising Provider    Answer:   Tresa Garter [[id number]]    [name]  MSN, FNP-C [place] [address] [place],  [id number] [phone]

## 2017-01-21 ENCOUNTER — Other Ambulatory Visit: Payer: Self-pay | Admitting: Family Medicine

## 2017-01-21 ENCOUNTER — Encounter: Payer: Self-pay | Admitting: Family Medicine

## 2017-01-21 ENCOUNTER — Ambulatory Visit (INDEPENDENT_AMBULATORY_CARE_PROVIDER_SITE_OTHER): Payer: 59 | Admitting: Family Medicine

## 2017-01-21 VITALS — BP 113/61 | HR 88 | Temp 98.7°F | Resp 16 | Ht 62.0 in | Wt 131.0 lb

## 2017-01-21 DIAGNOSIS — D571 Sickle-cell disease without crisis: Secondary | ICD-10-CM

## 2017-01-21 DIAGNOSIS — Z79891 Long term (current) use of opiate analgesic: Secondary | ICD-10-CM | POA: Diagnosis not present

## 2017-01-21 LAB — CBC WITH DIFFERENTIAL/PLATELET
BASOS PCT: 1 %
Basophils Absolute: 78 cells/uL (ref 0–200)
EOS ABS: 0 {cells}/uL — AB (ref 15–500)
Eosinophils Relative: 0 %
HEMATOCRIT: 24.5 % — AB (ref 35.0–45.0)
Hemoglobin: 8.3 g/dL — ABNORMAL LOW (ref 11.7–15.5)
LYMPHS PCT: 33 %
Lymphs Abs: 2574 cells/uL (ref 850–3900)
MCH: 35.3 pg — ABNORMAL HIGH (ref 27.0–33.0)
MCHC: 33.9 g/dL (ref 32.0–36.0)
MCV: 104.3 fL — AB (ref 80.0–100.0)
MONO ABS: 1794 {cells}/uL — AB (ref 200–950)
MPV: 9.5 fL (ref 7.5–12.5)
Monocytes Relative: 23 %
Neutro Abs: 3354 cells/uL (ref 1500–7800)
Neutrophils Relative %: 43 %
Platelets: 431 10*3/uL — ABNORMAL HIGH (ref 140–400)
RBC: 2.35 MIL/uL — ABNORMAL LOW (ref 3.80–5.10)
RDW: 22.3 % — AB (ref 11.0–15.0)
WBC: 7.8 10*3/uL (ref 3.8–10.8)

## 2017-01-21 LAB — POCT URINALYSIS DIP (DEVICE)
BILIRUBIN URINE: NEGATIVE
Glucose, UA: NEGATIVE mg/dL
Ketones, ur: NEGATIVE mg/dL
LEUKOCYTES UA: NEGATIVE
Nitrite: NEGATIVE
Protein, ur: NEGATIVE mg/dL
SPECIFIC GRAVITY, URINE: 1.015 (ref 1.005–1.030)
Urobilinogen, UA: >=8 mg/dL (ref 0.0–1.0)
pH: 7 (ref 5.0–8.0)

## 2017-01-21 MED ORDER — OXYCODONE HCL 10 MG PO TABS: 10.0000 mg | ORAL_TABLET | Freq: Four times a day (QID) | ORAL | 0 refills | Status: DC | PRN

## 2017-01-21 MED ORDER — L-GLUTAMINE ORAL POWDER: 5.0000 g | PACK | Freq: Two times a day (BID) | ORAL | 5 refills | Status: DC

## 2017-01-21 NOTE — Progress Notes (Signed)
[name], a 21 year old female presents for a follow up of sickle cell anemia. She says that she has been feeling increased fatigue over the past several days. She says that she is having minimal pain today. Pain is primarily to neck and back. Her current pain intensity is 3-4/10. She last had Oxycodone on yesterday with moderate relief. She says that she has not been hydrating as much as she should and often forgets to take hydrea and folic acid. She denies chest pain, shortness of breath, fatigue, paresthesias, dysuria, nausea, vomiting, or diarrhea.   Past Medical History:  Diagnosis Date  . Blood transfusion without reported diagnosis   . Bronchitis   . Chickenpox   . Depression   . Heart murmur   . Sickle cell anemia (HCC)   . Urinary tract infection    Social History   Social History  . Marital status: Single    Spouse name: N/A  . Number of children: N/A  . Years of education: N/A   Occupational History  . Not on file.   Social History Main Topics  . Smoking status: Former Research scientist (life sciences)  . Smokeless tobacco: Never Used  . Alcohol use 0.0 oz/week     Comment: occ  . Drug use: No  . Sexual activity: Yes    Birth control/ protection: Implant   Other Topics Concern  . Not on file   Social History Narrative   Lives with mom in a one story home.  No children.  Currently not working.  Education: 2 years of college.    Immunization History  Administered Date(s) Administered  . Influenza,inj,Quad PF,36+ Mos 07/22/2015, 06/12/2016  No Known Allergies  Review of Systems  Constitutional: Negative for chills, fever, malaise/fatigue and weight loss.  Eyes: Negative.   Respiratory: Negative.   Cardiovascular: Negative.   Gastrointestinal: Negative.   Genitourinary: Negative.   Musculoskeletal: Positive for myalgias and neck pain.  Skin: Negative.  Negative for rash.  Neurological: Negative.   Endo/Heme/Allergies: Negative.  Negative for environmental allergies and  polydipsia. Does not bruise/bleed easily.  Psychiatric/Behavioral: Negative.    BP 113/61 (BP Location: Left Arm, Patient Position: Sitting, Cuff Size: Normal)   Pulse 88   Temp 98.7 F (37.1 C) (Oral)   Resp 16   Ht 5\' 2"  (1.575 m)   Wt 131 lb (59.4 kg)   LMP 01/10/2017   BMI 23.96 kg/m   General Appearance:    Alert, cooperative, no distress, appears stated age  Head:    Normocephalic, without obvious abnormality, atraumatic  Eyes:    PERRL, conjunctiva/corneas clear, EOM's intact, fundi    benign, both eyes  Ears:    Normal TM's and external ear canals, both ears     Throat:   Lips, mucosa, and tongue normal; teeth and gums normal  Neck:   Supple, symmetrical, trachea midline, no adenopathy;    thyroid:  no enlargement/tenderness/nodules; Normal ROM    Back:     Symmetric, no curvature,  tenderness  Lungs:     Clear to auscultation bilaterally, respirations unlabored  Chest Wall:    No tenderness or deformity   Heart:    Regular rate and rhythm, S1 and S2 normal, no murmur, rub   or gallop  Abdomen:     Soft, non-tender, bowel sounds active all four quadrants,    no masses, no organomegaly  Extremities:   Extremities normal, atraumatic, no cyanosis or edema  Pulses:   2+ and symmetric all extremities  Skin:   Skin color, texture, turgor normal, no rashes or lesions  Lymph nodes:   Cervical, supraclavicular, and axillary nodes normal       Plan   1. Hb-SS disease without crisis ([place]) Sickle cell disease - Continue Hydrea at current dosage. Melisia was encouraged to take medication consistently. She may have to program phone for reminders to take medicatioon. We discussed the need for good hydration, monitoring of hydration status, avoidance of heat, cold, stress, and infection triggers. We discussed the risks and benefits of Hydrea, including bone marrow suppression, the possibility of GI upset, skin ulcers, hair thinning, and teratogenicity. The patient was reminded of the  need to seek medical attention of any symptoms of bleeding, anemia, or infection. Continue folic acid 1 mg daily to prevent aplastic bone marrow crises.   Discussed the importance of drinking 64 ounces of water daily. The Importance of Water. To help prevent pain crises, it is important to drink plenty of water throughout the day. This is because dehydration of red blood cells may lead to the sickling process.   Tiffanie and I discussed starting a trial of Endari for further management of sickle cell anemia. The findings showed a 25% reduction in the frequency of sickle-cell crises, a 33% decrease in hospitalisation rates, fewer hospital visits due to sickle-cell pain and 60% fewer occurrences of acute chest syndrome in patients administered with Endari.      Pulmonary evaluation - Patient denies severe recurrent wheezes, shortness of breath with exercise, or persistent cough. If these symptoms develop, pulmonary function tests with spirometry will be ordered, and if abnormal, plan on referral to Pulmonology for further evaluation.  Cardiac - Routine screening for pulmonary hypertension is not recommended.  Eye - High risk of proliferative retinopathy. Annual eye exam with retinal exam recommended to patient.  Immunization status - Up to date with immunizations.   Acute and chronic painful episodes - We agreed on current pain management regimen. We discussed that pt is to receive her Schedule II prescriptions only from [place]. Pt is also aware that the prescription history is available to [place] online through the [facility] CSRS. Controlled substance agreement signed previously. We reminded Nancee that all patients receiving Schedule II narcotics must be seen for follow within one month of prescription being requested. We reviewed the terms of our pain agreement, including the need to keep medicines in a safe locked location away from children or pets, and the need to report excess sedation or constipation,  measures to avoid constipation, and policies related to early refills and stolen prescriptions. According to the [place] Chronic Pain Initiative program, we have reviewed details related to analgesia, adverse effects, aberrant behaviors.   Reviewed [place] Substance Reporting system prior to prescribing opiate medications. No inconsistencies noted.   - L-glutamine (ENDARI) 5 g PACK Powder Packet; Take 5 g by mouth 2 (two) times daily.  Dispense: 120 each; Refill: 5 - CBC with Differential - COMPLETE METABOLIC PANEL WITH GFR - Pain Mgmt, Profile 8 w/Conf, U - Oxycodone HCl 10 MG TABS; Take 1 tablet (10 mg total) by mouth every 6 (six) hours as needed.  Dispense: 60 tablet; Refill: 0    2. Chronic prescription opiate use  - Pain Mgmt, Profile 8 w/Conf, U

## 2017-01-21 NOTE — Patient Instructions (Addendum)
Sickle cell anemia- Will start a trial of Endari. Will add a 5 g packet of Endari to a 4-6 ounce drink in am and pm.  The findings showed a 25% reduction in the frequency of sickle-cell crises, a 33% decrease in hospitalisation rates, fewer hospital visits due to sickle-cell pain and 60% fewer occurrences of acute chest syndrome in patients administered with Endari.   Increase water intake to 64 ounces per day Recommend a balanced Will follow up by phone with any abnormal laboratory results.    Sickle Cell Anemia, Adult Sickle cell anemia is a condition where your red blood cells are shaped like sickles. Red blood cells carry oxygen through the body. Sickle-shaped red blood cells do not live as long as normal red blood cells. They also clump together and block blood from flowing through the blood vessels. These things prevent the body from getting enough oxygen. Sickle cell anemia causes organ damage and pain. It also increases the risk of infection. Follow these instructions at home:  Drink enough fluid to keep your pee (urine) clear or pale yellow. Drink more in hot weather and during exercise.  Do not smoke. Smoking lowers oxygen levels in the blood.  Only take over-the-counter or prescription medicines as told by your doctor.  Take antibiotic medicines as told by your doctor. Make sure you finish them even if you start to feel better.  Take supplements as told by your doctor.  Consider wearing a medical alert bracelet. This tells anyone caring for you in an emergency of your condition.  When traveling, keep your medical information, doctors' names, and the medicines you take with you at all times.  If you have a fever, do not take fever medicines right away. This could cover up a problem. Tell your doctor.  Keep all follow-up visits with your doctor. Sickle cell anemia requires regular medical care. Contact a doctor if: You have a fever. Get help right away if:  You feel dizzy  or faint.  You have new belly (abdominal) pain, especially on the left side near the stomach area.  You have a lasting, often uncomfortable and painful erection of the penis (priapism). If it is not treated right away, you will become unable to have sex (impotence).  You have numbness in your arms or legs or you have a hard time moving them.  You have a hard time talking.  You have a fever or lasting symptoms for more than 2-3 days.  You have a fever and your symptoms suddenly get worse.  You have signs or symptoms of infection. These include: ? Chills. ? Being more tired than normal (lethargy). ? Irritability. ? Poor eating. ? Throwing up (vomiting).  You have pain that is not helped with medicine.  You have shortness of breath.  You have pain in your chest.  You are coughing up pus-like or bloody mucus.  You have a stiff neck.  Your feet or hands swell or have pain.  Your belly looks bloated.  Your joints hurt. This information is not intended to replace advice given to you by your health care provider. Make sure you discuss any questions you have with your health care provider. Document Released: 05/10/2013 Document Revised: 12/26/2015 Document Reviewed: 03/01/2013 Elsevier Interactive Patient Education  2017 [employer].

## 2017-01-22 LAB — COMPLETE METABOLIC PANEL WITH GFR
ALT: 7 U/L (ref 6–29)
AST: 26 U/L (ref 10–30)
Albumin: 4.6 g/dL (ref 3.6–5.1)
Alkaline Phosphatase: 66 U/L (ref 33–115)
BUN: 8 mg/dL (ref 7–25)
CO2: 19 mmol/L — ABNORMAL LOW (ref 20–31)
Calcium: 9.4 mg/dL (ref 8.6–10.2)
Chloride: 107 mmol/L (ref 98–110)
Creat: 0.53 mg/dL (ref 0.50–1.10)
GFR, Est African American: >89 mL/min (ref >=60)
GFR, Est Non African American: >89 mL/min (ref >=60)
Glucose, Bld: 84 mg/dL (ref 65–99)
Potassium: 4 mmol/L (ref 3.5–5.3)
Sodium: 136 mmol/L (ref 135–146)
Total Bilirubin: 3.8 mg/dL — ABNORMAL HIGH (ref 0.2–1.2)
Total Protein: 6.8 g/dL (ref 6.1–8.1)

## 2017-01-23 LAB — FERRITIN: FERRITIN: 757 ng/mL — AB (ref 10–154)

## 2017-01-25 LAB — PAIN MGMT, PROFILE 8 W/CONF, U
6 ACETYLMORPHINE: NEGATIVE ng/mL (ref <10)
AMPHETAMINES: NEGATIVE ng/mL (ref <500)
Alcohol Metabolites: POSITIVE ng/mL — AB (ref <500)
BUPRENORPHINE: NEGATIVE ng/mL (ref <5)
Benzodiazepines: NEGATIVE ng/mL (ref <100)
COCAINE METABOLITE: NEGATIVE ng/mL (ref <150)
CREATININE: 77.2 mg/dL (ref >=20.0)
ETHYL GLUCURONIDE (ETG): 5595 ng/mL — AB (ref <500)
Ethyl Sulfate (ETS): 1856 ng/mL — ABNORMAL HIGH (ref <100)
MARIJUANA METABOLITE: POSITIVE ng/mL — AB (ref <20)
MDMA: NEGATIVE ng/mL (ref <500)
Marijuana Metabolite: 898 ng/mL — ABNORMAL HIGH (ref <5)
OPIATES: NEGATIVE ng/mL (ref <100)
OXIDANT: NEGATIVE ug/mL (ref <200)
Oxycodone: NEGATIVE ng/mL (ref <100)
PH: 7.38 (ref 4.5–9.0)
PLEASE NOTE: 0

## 2017-02-11 ENCOUNTER — Telehealth (HOSPITAL_COMMUNITY): Payer: Self-pay | Admitting: Hematology

## 2017-02-11 NOTE — Telephone Encounter (Signed)
Patient C/O pain to back, legs, and hips.  Patient rates pain 8/10 on pain scale.  Denies chest pain or shortness of breath, denies fever, denies abdominal pain or nausea.  Patient has had 1 loose stool today.  Patient has taken tramadol this morning, oxycodone yesterday.  I advised I would speak with provider and give her a call back. Patient verbalizes understanding.

## 2017-02-11 NOTE — Telephone Encounter (Signed)
Advised patient that per Lavell Anchors, FNP she should take her oxycodone as prescribed and increase her fluid intake if possible.  If not feeling better after taking home medication consistently, patient can call back to be re-evaluated.  Patient verbalizes understanding.

## 2017-02-22 ENCOUNTER — Telehealth: Payer: Self-pay

## 2017-02-23 ENCOUNTER — Other Ambulatory Visit: Payer: Self-pay | Admitting: Family Medicine

## 2017-02-23 DIAGNOSIS — D571 Sickle-cell disease without crisis: Secondary | ICD-10-CM

## 2017-02-23 MED ORDER — OXYCODONE HCL 10 MG PO TABS: 10.0000 mg | ORAL_TABLET | Freq: Four times a day (QID) | ORAL | 0 refills | Status: DC | PRN

## 2017-02-23 NOTE — Progress Notes (Signed)
Reviewed [place] Substance Reporting system prior to prescribing opiate medications. Inconsistencies noted, Rx received from [facility], [place]. Patient was evaluated in the emrgengency department at [facility] on 02/14/2017.  Will sign a new medication agreement following review.   Meds ordered this encounter  Medications  . Oxycodone HCl 10 MG TABS    Sig: Take 1 tablet (10 mg total) by mouth every 6 (six) hours as needed.    Dispense:  60 tablet    Refill:  0    Order Specific Question:   Supervising Provider    Answer:   Tresa Garter [[id number]]    [name]  MSN, FNP-C [facility] [address] [place], [place] [address] [phone]

## 2017-03-23 ENCOUNTER — Ambulatory Visit: Payer: 59 | Admitting: Family Medicine

## 2017-03-29 ENCOUNTER — Telehealth: Payer: Self-pay

## 2017-03-29 DIAGNOSIS — D571 Sickle-cell disease without crisis: Secondary | ICD-10-CM

## 2017-03-29 MED ORDER — OXYCODONE HCL 10 MG PO TABS: 10.0000 mg | ORAL_TABLET | Freq: Four times a day (QID) | ORAL | 0 refills | Status: AC | PRN

## 2017-03-29 NOTE — Telephone Encounter (Signed)
Refilled oxycodone 10 mg, qty. 60 tablets.

## 2017-03-30 ENCOUNTER — Encounter: Payer: Self-pay | Admitting: Family Medicine

## 2017-03-30 ENCOUNTER — Ambulatory Visit (INDEPENDENT_AMBULATORY_CARE_PROVIDER_SITE_OTHER): Payer: 59 | Admitting: Family Medicine

## 2017-03-30 VITALS — BP 119/70 | HR 84 | Temp 99.0°F | Resp 16 | Ht 62.0 in | Wt 130.0 lb

## 2017-03-30 DIAGNOSIS — F319 Bipolar disorder, unspecified: Secondary | ICD-10-CM

## 2017-03-30 DIAGNOSIS — D571 Sickle-cell disease without crisis: Secondary | ICD-10-CM | POA: Diagnosis not present

## 2017-03-30 LAB — CBC WITH DIFFERENTIAL/PLATELET
Basophils Absolute: 110 cells/uL (ref 0–200)
Basophils Relative: 1 %
EOS PCT: 1 %
Eosinophils Absolute: 110 cells/uL (ref 15–500)
HCT: 23.5 % — ABNORMAL LOW (ref 35.0–45.0)
HEMOGLOBIN: 7.8 g/dL — AB (ref 11.7–15.5)
LYMPHS ABS: 3740 {cells}/uL (ref 850–3900)
Lymphocytes Relative: 34 %
MCH: 37.3 pg — ABNORMAL HIGH (ref 27.0–33.0)
MCHC: 33.2 g/dL (ref 32.0–36.0)
MCV: 112.4 fL — ABNORMAL HIGH (ref 80.0–100.0)
MPV: 8.9 fL (ref 7.5–12.5)
Monocytes Absolute: 1540 cells/uL — ABNORMAL HIGH (ref 200–950)
Monocytes Relative: 14 %
NEUTROS ABS: 5500 {cells}/uL (ref 1500–7800)
Neutrophils Relative %: 50 %
Platelets: 496 10*3/uL — ABNORMAL HIGH (ref 140–400)
RBC: 2.09 MIL/uL — AB (ref 3.80–5.10)
RDW: 22 % — ABNORMAL HIGH (ref 11.0–15.0)
WBC: 11 10*3/uL — AB (ref 3.8–10.8)

## 2017-03-30 LAB — POCT URINALYSIS DIP (DEVICE)
BILIRUBIN URINE: NEGATIVE
Glucose, UA: NEGATIVE mg/dL
Hgb urine dipstick: NEGATIVE
Ketones, ur: NEGATIVE mg/dL
LEUKOCYTES UA: NEGATIVE
NITRITE: NEGATIVE
PH: 7 (ref 5.0–8.0)
Protein, ur: NEGATIVE mg/dL
Specific Gravity, Urine: 1.015 (ref 1.005–1.030)
UROBILINOGEN UA: 2 mg/dL — AB (ref 0.0–1.0)

## 2017-03-30 LAB — POCT URINE PREGNANCY: PREG TEST UR: NEGATIVE

## 2017-03-30 LAB — RETICULOCYTES
ABS RETIC: 518320 {cells}/uL — AB (ref 20000–80000)
RBC.: 2.09 MIL/uL — AB (ref 3.80–5.10)
RETIC CT PCT: 24.8 %

## 2017-03-30 NOTE — Progress Notes (Signed)
Patient ID: Sandra Mckay, female    DOB: [birth year], 21 y.o.   MRN: [id number]  PCP: Dorena Dew, FNP  Chief Complaint  Patient presents with  . Follow-up    Medication management    Subjective:  HPI Sandra Mckay is a 21 y.o. female with sickle cell anemia presents for medication management. Sandra Mckay reports today that her sickle cell related chronic pain has remained stable. She is currently prescribed oxycodone 10 mg every 6 hours as needed for pain. She reports pain free days and therefore only has to take medication when absolutely necessary. Her pain episodes typically occur in her hips and back and often occur worse when waking up in the morning and typically resolves independently throughout the day with activity. Sandra Mckay also has a history of bipolar disorder and is currently being followed at [facility], in [place], [place] .For bipolar medication management she is currently prescribed Lamictal. She reports significant improvement in her overall mood and mental health function and since starting current medication regimen. She denies any current depression or thoughts of suicide, or thoughts of harming others. Social History   Social History  . Marital status: Single    Spouse name: N/A  . Number of children: N/A  . Years of education: N/A   Occupational History  . Not on file.   Social History Main Topics  . Smoking status: Former Research scientist (life sciences)  . Smokeless tobacco: Never Used  . Alcohol use 0.0 oz/week     Comment: occ  . Drug use: No  . Sexual activity: Yes    Birth control/ protection: Implant   Other Topics Concern  . Not on file   Social History Narrative   Lives with mom in a one story home.  No children.  Currently not working.  Education: 2 years of college.     Family History  Problem Relation Age of Onset  . Arthritis Other        grandparent  . Stroke Other   . Hypertension Other   . Diabetes Unknown        grandparent  . Cancer -  Other Unknown        Glioblastoma    Review of Systems See HPI  Patient Active Problem List   Diagnosis Date Noted  . Sickle cell pain crisis ([place]) 12/05/2016  . Sickle cell anemia with crisis ([place]) 12/05/2016  . Vasovagal syncope 09/18/2016  . Closed fracture of lumbar vertebra without spinal cord injury ([place]) 07/15/2016  . Nausea without vomiting 09/09/2015  . Vitamin D deficiency 07/22/2015  . Sickle cell disease ([place]) 06/08/2015    No Known Allergies  Prior to Admission medications   Medication Sig Start Date End Date Taking? Authorizing Provider  DROXIA 300 MG capsule TAKE 1 CAPSULE (300 MG TOTAL) BY MOUTH DAILY. 10/17/15  Yes Dorena Dew, FNP  folic acid (FOLVITE) 1 MG tablet Take 1 tablet (1 mg total) by mouth daily. 12/17/15  Yes [name], Olugbemiga E, MD  hydroxyurea (HYDREA) 500 MG capsule TAKE 3 CAPSULES (1,500 MG TOTAL) BY MOUTH DAILY. 08/10/16  Yes Dorena Dew, FNP  L-glutamine (ENDARI) 5 g PACK Powder Packet Take 5 g by mouth 2 (two) times daily. 01/21/17  Yes Dorena Dew, FNP  ondansetron (ZOFRAN) 4 MG tablet Take 1 tablet (4 mg total) by mouth every 8 (eight) hours as needed for nausea or vomiting. 09/09/15  Yes Dorena Dew, FNP  Oxycodone HCl 10 MG TABS Take 1 tablet (10  mg total) by mouth every 6 (six) hours as needed. 03/29/17 04/13/17 Yes Scot Jun, FNP  Vitamin D, Ergocalciferol, (DRISDOL) 50000 units CAPS capsule Take 1 capsule (50,000 Units total) by mouth every Saturday. Saturday. 11/28/16  Yes Dorena Dew, FNP    Past Medical, Surgical Family and Social History reviewed and updated.    Objective:   Today's Vitals   03/30/17 1110  BP: 119/70  Pulse: 84  Resp: 16  Temp: 99 F (37.2 C)  TempSrc: Oral  SpO2: 97%  Weight: 130 lb (59 kg)  Height: 5\' 2"  (1.575 m)    Wt Readings from Last 3 Encounters:  03/30/17 130 lb (59 kg)  01/21/17 131 lb (59.4 kg)  12/09/16 130 lb 1.1 oz (59 kg)   Physical Exam  Constitutional:  She is oriented to person, place, and time. She appears well-developed and well-nourished.  HENT:  Head: Normocephalic and atraumatic.  Eyes: Pupils are equal, round, and reactive to light. Conjunctivae and EOM are normal.  Neck: Normal range of motion. Neck supple.  Cardiovascular: Normal rate, regular rhythm, normal heart sounds and intact distal pulses.   Pulmonary/Chest: Effort normal and breath sounds normal.  Abdominal: Soft. Bowel sounds are normal. She exhibits no distension and no mass. There is no tenderness. There is no rebound and no guarding.  Musculoskeletal: Normal range of motion.  Neurological: She is alert and oriented to person, place, and time.  Skin: Skin is warm and dry.  Psychiatric: She has a normal mood and affect. Her behavior is normal. Judgment and thought content normal.   Assessment & Plan:  1. Hb-SS disease without crisis ([place]) Continue Hydrea. We discussed the need for good hydration, monitoring of hydration status, avoidance of heat, cold, stress, and infection triggers. We discussed the risks and benefits of Hydrea, including bone marrow suppression, the possibility of GI upset, skin ulcers, hair thinning, and teratogenicity. The patient was reminded of the need to seek medical attention for any symptoms of bleeding, anemia, or infection. Continue folic acid 1 mg daily to prevent aplastic bone marrow crises.   Pulmonary evaluation - Patient denies severe recurrent wheezes, shortness of breath with exercise, or persistent cough. If these symptoms develop, pulmonary function tests with spirometry will be ordered, and if abnormal, plan on referral to Pulmonology for further evaluation.  Eye - High risk of proliferative retinopathy. Annual eye exam with retinal exam recommended to patient, the patient has had eye exam this year.  Immunization status - Yearly influenza vaccination is recommended, as well as being up to date with Meningococcal and Pneumococcal  vaccines.   Acute and chronic painful episodes - We agreed on Opiate dose and amount of pills  per month. We discussed that pt is to receive Schedule II prescriptions only from our clinic. Pt is also aware that the prescription history is available to [place] online through the [facility] CSRS. Controlled substance agreement reviewed and signed. We reminded  .[name] that all patients receiving Schedule II narcotics must be seen for follow within one month of prescription being requested. We reviewed the terms of our pain agreement, including the need to keep medicines in a safe locked location away from children or pets, and the need to report excess sedation or constipation, measures to avoid constipation, and policies related to early refills and stolen prescriptions. According to the [place] Chronic Pain Initiative program, we have reviewed details related to analgesia, adverse effects and aberrant behaviors.    -Oxycodone 10 mg every  6 hours as needed for pain  2. Bipolar and related disorder -Continue treatment and follow-up at [facility]  Return in about 4 weeks for Sickle Cell Disease/Pain.  The patient was given clear instructions to go to ER or return to medical center if symptoms don't improve, worsen or new problems develop. The patient verbalized understanding. The patient was told to call to get lab results if they haven't heard anything in the next week.      Carroll Sage. Kenton Kingfisher, MSN, FNP-C [facility]  [address] [place], [place] [address] [phone]

## 2017-03-31 LAB — COMPLETE METABOLIC PANEL WITH GFR
ALT: 8 U/L (ref 6–29)
AST: 22 U/L (ref 10–30)
Albumin: 4.8 g/dL (ref 3.6–5.1)
Alkaline Phosphatase: 66 U/L (ref 33–115)
BUN: 6 mg/dL — ABNORMAL LOW (ref 7–25)
CALCIUM: 9.4 mg/dL (ref 8.6–10.2)
CO2: 17 mmol/L — AB (ref 20–32)
CREATININE: 0.44 mg/dL — AB (ref 0.50–1.10)
Chloride: 107 mmol/L (ref 98–110)
GFR, Est African American: >89 mL/min (ref >=60)
GFR, Est Non African American: >89 mL/min (ref >=60)
GLUCOSE: 76 mg/dL (ref 65–99)
Potassium: 4.7 mmol/L (ref 3.5–5.3)
SODIUM: 138 mmol/L (ref 135–146)
Total Bilirubin: 3.1 mg/dL — ABNORMAL HIGH (ref 0.2–1.2)
Total Protein: 6.8 g/dL (ref 6.1–8.1)

## 2017-04-04 ENCOUNTER — Telehealth: Payer: Self-pay | Admitting: Family Medicine

## 2017-04-04 DIAGNOSIS — F319 Bipolar disorder, unspecified: Secondary | ICD-10-CM | POA: Insufficient documentation

## 2017-04-04 NOTE — Telephone Encounter (Signed)
Please update patient outside medications. Lamictal 100 mg is not on medication list.

## 2017-04-16 ENCOUNTER — Ambulatory Visit: Payer: 59 | Admitting: Family Medicine

## 2017-04-28 ENCOUNTER — Telehealth: Payer: Self-pay

## 2017-04-29 MED ORDER — OXYCODONE HCL 10 MG PO TABS: 10.0000 mg | ORAL_TABLET | Freq: Four times a day (QID) | ORAL | 0 refills | Status: DC | PRN

## 2017-04-29 NOTE — Telephone Encounter (Signed)
Oxycodone qty: 60 tablets with fill date 04/30/2017

## 2017-04-30 ENCOUNTER — Telehealth: Payer: Self-pay

## 2017-05-31 ENCOUNTER — Encounter: Payer: Self-pay | Admitting: Family Medicine

## 2017-05-31 ENCOUNTER — Ambulatory Visit (INDEPENDENT_AMBULATORY_CARE_PROVIDER_SITE_OTHER): Payer: 59 | Admitting: Family Medicine

## 2017-05-31 VITALS — BP 117/74 | HR 107 | Temp 98.7°F | Resp 16 | Ht 62.0 in | Wt 126.0 lb

## 2017-05-31 DIAGNOSIS — Z79891 Long term (current) use of opiate analgesic: Secondary | ICD-10-CM | POA: Diagnosis not present

## 2017-05-31 DIAGNOSIS — E559 Vitamin D deficiency, unspecified: Secondary | ICD-10-CM

## 2017-05-31 DIAGNOSIS — D571 Sickle-cell disease without crisis: Secondary | ICD-10-CM

## 2017-05-31 LAB — POCT URINALYSIS DIP (DEVICE)
Bilirubin Urine: NEGATIVE
Glucose, UA: NEGATIVE mg/dL
Hgb urine dipstick: NEGATIVE
KETONES UR: NEGATIVE mg/dL
Leukocytes, UA: NEGATIVE
Nitrite: NEGATIVE
PH: 7 (ref 5.0–8.0)
PROTEIN: NEGATIVE mg/dL
SPECIFIC GRAVITY, URINE: 1.015 (ref 1.005–1.030)
Urobilinogen, UA: >=8 mg/dL (ref 0.0–1.0)

## 2017-05-31 MED ORDER — VITAMIN D (ERGOCALCIFEROL) 1.25 MG (50000 UNIT) PO CAPS: 50000.0000 [IU] | ORAL_CAPSULE | ORAL | 1 refills | Status: DC

## 2017-05-31 MED ORDER — HYDROXYUREA 500 MG PO CAPS: ORAL_CAPSULE | ORAL | 2 refills | Status: DC

## 2017-05-31 MED ORDER — OXYCODONE HCL 10 MG PO TABS: 10.0000 mg | ORAL_TABLET | Freq: Four times a day (QID) | ORAL | 0 refills | Status: DC | PRN

## 2017-05-31 NOTE — Patient Instructions (Signed)
[facility]. No medication changes warranted on today. We will continue oxycodone 10 mg every 6 hours for pain related to sickle cell anemia.  Discussed the importance of drinking 64 ounces of water daily. The Importance of Water. To help prevent pain crises, it is important to drink plenty of water throughout the day. This is because dehydration of red blood cells may lead to the sickling process.     Sickle Cell Anemia, Adult Sickle cell anemia is a condition where your red blood cells are shaped like sickles. Red blood cells carry oxygen through the body. Sickle-shaped red blood cells do not live as long as normal red blood cells. They also clump together and block blood from flowing through the blood vessels. These things prevent the body from getting enough oxygen. Sickle cell anemia causes organ damage and pain. It also increases the risk of infection. Follow these instructions at home:  Drink enough fluid to keep your pee (urine) clear or pale yellow. Drink more in hot weather and during exercise.  Do not smoke. Smoking lowers oxygen levels in the blood.  Only take over-the-counter or prescription medicines as told by your doctor.  Take antibiotic medicines as told by your doctor. Make sure you finish them even if you start to feel better.  Take supplements as told by your doctor.  Consider wearing a medical alert bracelet. This tells anyone caring for you in an emergency of your condition.  When traveling, keep your medical information, doctors' names, and the medicines you take with you at all times.  If you have a fever, do not take fever medicines right away. This could cover up a problem. Tell your doctor.  Keep all follow-up visits with your doctor. Sickle cell anemia requires regular medical care. Contact a doctor if: You have a fever. Get help right away if:  You feel dizzy or faint.  You have new belly (abdominal) pain, especially on  the left side near the stomach area.  You have a lasting, often uncomfortable and painful erection of the penis (priapism). If it is not treated right away, you will become unable to have sex (impotence).  You have numbness in your arms or legs or you have a hard time moving them.  You have a hard time talking.  You have a fever or lasting symptoms for more than 2-3 days.  You have a fever and your symptoms suddenly get worse.  You have signs or symptoms of infection. These include: ? Chills. ? Being more tired than normal (lethargy). ? Irritability. ? Poor eating. ? Throwing up (vomiting).  You have pain that is not helped with medicine.  You have shortness of breath.  You have pain in your chest.  You are coughing up pus-like or bloody mucus.  You have a stiff neck.  Your feet or hands swell or have pain.  Your belly looks bloated.  Your joints hurt. This information is not intended to replace advice given to you by your health care provider. Make sure you discuss any questions you have with your health care provider. Document Released: 05/10/2013 Document Revised: 12/26/2015 Document Reviewed: 03/01/2013 Elsevier Interactive Patient Education  2017 [employer].

## 2017-05-31 NOTE — Progress Notes (Signed)
[name], a 21 year old female presents for a follow up of sickle cell anemia.  Patient is having pain to right lower back that is consistent with typical sickle cell pain.  Pain is primarily to neck and back.  Current pain intensity is 4 out of 10 described as constant and throbbing.  She has been out of oxycodone over the past 4 days.  She has been taking over-the-counter ibuprofen without sustained relief . She says that she has not been hydrating as much as she should and often forgets to take hydrea and folic acid. She denies chest pain, shortness of breath, fatigue, paresthesias, dysuria, nausea, vomiting, or diarrhea.   Past Medical History:  Diagnosis Date  . Blood transfusion without reported diagnosis   . Bronchitis   . Chickenpox   . Depression   . Heart murmur   . Sickle cell anemia (HCC)   . Urinary tract infection    Social History   Social History  . Marital status: Single    Spouse name: N/A  . Number of children: N/A  . Years of education: N/A   Occupational History  . Not on file.   Social History Main Topics  . Smoking status: Former Research scientist (life sciences)  . Smokeless tobacco: Never Used  . Alcohol use 0.0 oz/week     Comment: occ  . Drug use: No  . Sexual activity: Yes    Birth control/ protection: Implant   Other Topics Concern  . Not on file   Social History Narrative   Lives with mom in a one story home.  No children.  Currently not working.  Education: 2 years of college.    Immunization History  Administered Date(s) Administered  . Influenza,inj,Quad PF,6+ Mos 07/22/2015, 06/12/2016  No Known Allergies  Review of Systems  Constitutional: Negative for chills, fever, malaise/fatigue and weight loss.  Eyes: Negative.   Respiratory: Negative.   Cardiovascular: Negative.   Gastrointestinal: Negative.   Genitourinary: Negative.   Musculoskeletal: Positive for myalgias and neck pain.  Skin: Negative.  Negative for rash.  Neurological: Negative.    Endo/Heme/Allergies: Negative.  Negative for environmental allergies and polydipsia. Does not bruise/bleed easily.  Psychiatric/Behavioral: Negative.    BP 117/74 (BP Location: Right Arm, Patient Position: Sitting, Cuff Size: Normal)   Pulse (!) 107   Temp 98.7 F (37.1 C) (Oral)   Resp 16   Ht 5\' 2"  (1.575 m)   Wt 126 lb (57.2 kg)   LMP 05/10/2017   BMI 23.05 kg/m   General Appearance:    Alert, cooperative, no distress, appears stated age  Head:    Normocephalic, without obvious abnormality, atraumatic  Eyes:    PERRL, conjunctiva/corneas clear, EOM's intact, fundi    benign, both eyes  Ears:    Normal TM's and external ear canals, both ears     Throat:   Lips, mucosa, and tongue normal; teeth and gums normal  Neck:   Supple, symmetrical, trachea midline, no adenopathy;    thyroid:  no enlargement/tenderness/nodules; Normal ROM    Back:     Symmetric, no curvature,  tenderness  Lungs:     Clear to auscultation bilaterally, respirations unlabored  Chest Wall:    No tenderness or deformity   Heart:    Regular rate and rhythm, S1 and S2 normal, no murmur, rub   or gallop  Abdomen:     Soft, non-tender, bowel sounds active all four quadrants,    no masses, no organomegaly  Extremities:  Extremities normal, atraumatic, no cyanosis or edema  Pulses:   2+ and symmetric all extremities  Skin:   Skin color, texture, turgor normal, no rashes or lesions  Lymph nodes:   Cervical, supraclavicular, and axillary nodes normal       Plan   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 Sandra Mckay was encouraged to take medication consistently. She may have to program phone for reminders to take medicatioon. We discussed the need for good hydration, monitoring of hydration status, avoidance of heat, cold, stress, and infection triggers. We discussed the risks and benefits of Hydrea, including bone marrow suppression, the possibility of GI upset,  skin ulcers, hair thinning, and teratogenicity. The patient was reminded of the need to seek medical attention of any symptoms of bleeding, anemia, or infection. Continue folic acid 1 mg daily to prevent aplastic bone marrow crises.   Discussed the importance of drinking 64 ounces of water daily. The Importance of Water. To help prevent pain crises, it is important to drink plenty of water throughout the day. This is because dehydration of red blood cells may lead to the sickling process.   Pulmonary evaluation - Patient denies severe recurrent wheezes, shortness of breath with exercise, or persistent cough. If these symptoms develop, pulmonary function tests with spirometry will be ordered, and if abnormal, plan on referral to Pulmonology for further evaluation.  Cardiac - Routine screening for pulmonary hypertension is not recommended.  Eye - High risk of proliferative retinopathy. Annual eye exam with retinal exam recommended to patient.  Immunization status - Up to date with immunizations.   Acute and chronic painful episodes - We agreed on current pain management regimen. We discussed that pt is to receive her Schedule II prescriptions only from [place]. Pt is also aware that the prescription history is available to [place] online through the [facility] CSRS. Controlled substance agreement signed previously. We reminded Sandra Mckay that all patients receiving Schedule II narcotics must be seen for follow within one month of prescription being requested. We reviewed the terms of our pain agreement, including the need to keep medicines in a safe locked location away from children or pets, and the need to report excess sedation or constipation, measures to avoid constipation, and policies related to early refills and stolen prescriptions. According to the [place] Chronic Pain Initiative program, we have reviewed details related to analgesia, adverse effects, aberrant behaviors.  - Oxycodone HCl 10 MG TABS; Take 1 tablet (10 mg  total) by mouth every 6 (six) hours as needed.  Dispense: 60 tablet; Refill: 0 - COMPLETE METABOLIC PANEL WITH GFR - CBC with Differential - Reticulocytes - hydroxyurea (HYDREA) 500 MG capsule; TAKE 3 CAPSULES (1,500 MG TOTAL) BY MOUTH DAILY.  Dispense: 90 capsule; Refill: 2  2. Chronic prescription opiate use - Oxycodone HCl 10 MG TABS; Take 1 tablet (10 mg total) by mouth every 6 (six) hours as needed.  Dispense: 60 tablet; Refill: 0 - COMPLETE METABOLIC PANEL WITH GFR - Pain Mgmt, Profile 8 w/Conf, U     Opioid Risk Tool - 05/31/17 1100      Family History of Substance Abuse   Alcohol Negative   Illegal Drugs Negative   Rx Drugs Negative     Personal History of Substance Abuse   Alcohol Negative   Illegal Drugs Positive Female or Female   Rx Drugs Negative     Age   Age between 65-45 years  No  History of Preadolescent Sexual Abuse   History of Preadolescent Sexual Abuse Negative or Female     Psychological Disease   Psychological Disease Negative   Depression Negative     Total Score   Opioid Risk Tool Scoring 4   Opioid Risk Interpretation Moderate Risk     3. Vitamin D deficiency - Vitamin D, Ergocalciferol, (DRISDOL) 50000 units CAPS capsule; Take 1 capsule (50,000 Units total) by mouth every Saturday. Saturday.  Dispense: 30 capsule; Refill: 1   RTC: 2 months for sickle cell anemia and medication management   [name]  MSN, FNP-C [name] [address] [place], [place] [address] [phone]

## 2017-06-01 ENCOUNTER — Telehealth: Payer: Self-pay

## 2017-06-01 LAB — COMPLETE METABOLIC PANEL WITHOUT GFR
AG Ratio: 2.3 (calc) (ref 1.0–2.5)
ALT: 14 U/L (ref 6–29)
AST: 32 U/L — ABNORMAL HIGH (ref 10–30)
Albumin: 4.9 g/dL (ref 3.6–5.1)
Alkaline phosphatase (APISO): 63 U/L (ref 33–115)
BUN: 10 mg/dL (ref 7–25)
CO2: 22 mmol/L (ref 20–32)
Calcium: 9.6 mg/dL (ref 8.6–10.2)
Chloride: 105 mmol/L (ref 98–110)
Creat: 0.53 mg/dL (ref 0.50–1.10)
GFR, Est African American: 155 mL/min/{1.73_m2}
GFR, Est Non African American: 134 mL/min/{1.73_m2}
Globulin: 2.1 g/dL (ref 1.9–3.7)
Glucose, Bld: 86 mg/dL (ref 65–99)
Potassium: 4.7 mmol/L (ref 3.5–5.3)
Sodium: 137 mmol/L (ref 135–146)
Total Bilirubin: 5.6 mg/dL — ABNORMAL HIGH (ref 0.2–1.2)
Total Protein: 7 g/dL (ref 6.1–8.1)

## 2017-06-01 LAB — CBC WITH DIFFERENTIAL/PLATELET
BASOS ABS: 69 {cells}/uL (ref 0–200)
BASOS PCT: 0.6 %
EOS ABS: 23 {cells}/uL (ref 15–500)
Eosinophils Relative: 0.2 %
HCT: 20.1 % — ABNORMAL LOW (ref 35.0–45.0)
HEMOGLOBIN: 6.9 g/dL — AB (ref 11.7–15.5)
Lymphs Abs: 2956 cells/uL (ref 850–3900)
MCH: 40.1 pg — AB (ref 27.0–33.0)
MCHC: 34.3 g/dL (ref 32.0–36.0)
MCV: 116.9 fL — AB (ref 80.0–100.0)
MPV: 10.1 fL (ref 7.5–12.5)
Monocytes Relative: 18.9 %
Neutro Abs: 6279 cells/uL (ref 1500–7800)
Neutrophils Relative %: 54.6 %
PLATELETS: 440 10*3/uL — AB (ref 140–400)
RBC: 1.72 10*6/uL — ABNORMAL LOW (ref 3.80–5.10)
RDW: 17.5 % — ABNORMAL HIGH (ref 11.0–15.0)
TOTAL LYMPHOCYTE: 25.7 %
WBC: 11.5 10*3/uL — ABNORMAL HIGH (ref 3.8–10.8)
WBCMIX: 2174 {cells}/uL — AB (ref 200–950)

## 2017-06-01 LAB — RETICULOCYTES
ABS Retic: 388720 {cells}/uL — ABNORMAL HIGH (ref 20000–8000)
Retic Ct Pct: 22.6 %

## 2017-06-01 LAB — CBC MORPHOLOGY

## 2017-06-01 NOTE — Telephone Encounter (Signed)
-----   Message from Dorena Dew, [place] sent at 06/01/2017  7:19 AM EDT ----- Regarding: lab results Please inform patient that hemoglobin is decreased at 6.9. Patient c/o fatigue on yesterday. Will schedule patient for a transfusion of packed red blood cells on tomorrow. Please have Andria Frames add her to the scheduled.   Thanks

## 2017-06-01 NOTE — Telephone Encounter (Signed)
Called and spoke with patient. Advised that her hemoglobin had decreased and is at 6.9. Advised that we would schedule her for a blood transfusion. She states she doesn't usually receive a blood transfusion until her hgb is 6.5 or below. She wants to discuss this with her mother before she decides she is going to come in. She states she will call us back. Thanks!

## 2017-06-02 ENCOUNTER — Ambulatory Visit (HOSPITAL_COMMUNITY)
Admission: RE | Admit: 2017-06-02 | Discharge: 2017-06-02 | Disposition: A | Discharge status: Home / Self Care | Payer: 59 | Source: Ambulatory Visit | Attending: Family Medicine | Admitting: Family Medicine

## 2017-06-02 ENCOUNTER — Other Ambulatory Visit: Payer: Self-pay | Admitting: Family Medicine

## 2017-06-02 DIAGNOSIS — D5700 Hb-SS disease with crisis, unspecified: Secondary | ICD-10-CM | POA: Insufficient documentation

## 2017-06-02 LAB — PREPARE RBC (CROSSMATCH)

## 2017-06-02 LAB — HEMOGLOBIN AND HEMATOCRIT, BLOOD
HEMATOCRIT: 18.7 % — AB (ref 36.0–46.0)
Hemoglobin: 6.8 g/dL — CL (ref 12.0–15.0)

## 2017-06-02 MED ORDER — ONDANSETRON HCL 4 MG/2ML IJ SOLN
4.0000 mg | Freq: Once | INTRAMUSCULAR | Status: AC
  Administered 2017-06-02: 4 mg via INTRAVENOUS
  Filled 2017-06-02: qty 2

## 2017-06-02 MED ORDER — SODIUM CHLORIDE 0.9 % IV SOLN
Freq: Once | INTRAVENOUS | Status: AC
  Administered 2017-06-02: 15:00:00 via INTRAVENOUS

## 2017-06-02 NOTE — Progress Notes (Addendum)
Provider: [name] FNP  Diagnosis Association: Hb-SS disease with crisis ([place]) (D57.00)  Treatment 2 units of PRBC's via IVPB  Patient tolerated procedure well with no transfusion reaction. Discharge instructions given to patient and patient states an understanding. Patient alert, oriented, and ambulatory at time of discharge.

## 2017-06-02 NOTE — Progress Notes (Signed)
CRITICAL VALUE ALERT  Critical Value:  HGB 6.8  Date & Time Notied:  06/02/17 1346  Provider Notified:Yes [name] FNP

## 2017-06-02 NOTE — Discharge Instructions (Signed)

## 2017-06-02 NOTE — Progress Notes (Signed)
[name], 21 year old female, with a history of sickle cell anemia hemoglobin SS presents for transfusion of packed red blood cells due to a hemoglobin of 6.9 and feelings of fatigue over the past 3 days.  Patient is in the day infusion center in stable condition.  Patient arrived at the day infusion center in stable condition.   [name]  MSN, FNP-C Patient [facility] [address] [place], [place] [address] [phone]

## 2017-06-03 ENCOUNTER — Ambulatory Visit (HOSPITAL_COMMUNITY)
Admission: RE | Admit: 2017-06-03 | Discharge: 2017-06-03 | Disposition: A | Discharge status: Home / Self Care | Payer: 59 | Source: Ambulatory Visit | Attending: Family Medicine | Admitting: Family Medicine

## 2017-06-03 DIAGNOSIS — D571 Sickle-cell disease without crisis: Secondary | ICD-10-CM | POA: Diagnosis not present

## 2017-06-03 LAB — TYPE AND SCREEN
ABO/RH(D): A POS
Antibody Screen: NEGATIVE
Donor AG Type: NEGATIVE
Unit division: 0

## 2017-06-03 LAB — HEMOGLOBIN AND HEMATOCRIT, BLOOD
HEMATOCRIT: 24.3 % — AB (ref 36.0–46.0)
HEMOGLOBIN: 8.4 g/dL — AB (ref 12.0–15.0)

## 2017-06-03 LAB — BPAM RBC
BLOOD PRODUCT EXPIRATION DATE: 201811282359
ISSUE DATE / TIME: 201810311425
UNIT TYPE AND RH: 5100

## 2017-06-03 NOTE — Progress Notes (Signed)
Provider: [name] FNP  Diagnosis Association: Hb-SS disease with crisis ([place]) (D57.00)   Treatment Hemoglobin and hematocrit   Blood drawn patient tolerated procedure well. Discharge instructions given to patient and patient states an understanding. Patient alert, oriented, and ambulatory at time of discharge.

## 2017-06-04 LAB — PAIN MGMT, PROFILE 8 W/CONF, U
6 Acetylmorphine: NEGATIVE ng/mL (ref <10)
ALCOHOL METABOLITES: POSITIVE ng/mL — AB (ref <500)
ALPHAHYDROXYALPRAZOLAM: NEGATIVE ng/mL (ref <25)
ALPHAHYDROXYMIDAZOLAM: NEGATIVE ng/mL (ref <50)
AMPHETAMINES: NEGATIVE ng/mL (ref <500)
Alphahydroxytriazolam: NEGATIVE ng/mL (ref <50)
Aminoclonazepam: NEGATIVE ng/mL (ref <25)
BENZODIAZEPINES: NEGATIVE ng/mL (ref <100)
Buprenorphine, Urine: NEGATIVE ng/mL (ref <5)
COCAINE METABOLITE: NEGATIVE ng/mL (ref <150)
CREATININE: 81 mg/dL
ETHYL GLUCURONIDE (ETG): 4739 ng/mL — AB (ref <500)
Ethyl Sulfate (ETS): 4753 ng/mL — ABNORMAL HIGH (ref <100)
HYDROXYETHYLFLURAZEPAM: NEGATIVE ng/mL (ref <50)
Lorazepam: NEGATIVE ng/mL (ref <50)
MARIJUANA METABOLITE: 503 ng/mL — AB (ref <5)
MARIJUANA METABOLITE: POSITIVE ng/mL — AB (ref <20)
MDMA: NEGATIVE ng/mL (ref <500)
NORDIAZEPAM: NEGATIVE ng/mL (ref <50)
OXIDANT: NEGATIVE ug/mL (ref <200)
OXYCODONE: NEGATIVE ng/mL (ref <100)
Opiates: NEGATIVE ng/mL (ref <100)
Oxazepam: NEGATIVE ng/mL (ref <50)
PH: 7.2 (ref 4.5–9.0)
Temazepam: NEGATIVE ng/mL (ref <50)

## 2017-07-06 ENCOUNTER — Other Ambulatory Visit: Payer: Self-pay | Admitting: Internal Medicine

## 2017-07-06 ENCOUNTER — Telehealth: Payer: Self-pay

## 2017-07-06 DIAGNOSIS — Z79891 Long term (current) use of opiate analgesic: Secondary | ICD-10-CM

## 2017-07-06 DIAGNOSIS — D571 Sickle-cell disease without crisis: Secondary | ICD-10-CM

## 2017-07-06 MED ORDER — OXYCODONE HCL 10 MG PO TABS: 10.0000 mg | ORAL_TABLET | Freq: Four times a day (QID) | ORAL | 0 refills | Status: DC | PRN

## 2017-07-13 NOTE — Addendum Note (Signed)
Encounter addended by: Dorena Dew, FNP on: 07/13/2017 6:02 AM  Actions taken: Problem List modified

## 2017-08-04 ENCOUNTER — Encounter: Payer: Self-pay | Admitting: Family Medicine

## 2017-08-04 ENCOUNTER — Ambulatory Visit (INDEPENDENT_AMBULATORY_CARE_PROVIDER_SITE_OTHER): Payer: 59 | Admitting: Family Medicine

## 2017-08-04 VITALS — BP 122/49 | HR 98 | Temp 98.6°F | Resp 16 | Ht 62.0 in | Wt 128.0 lb

## 2017-08-04 DIAGNOSIS — D571 Sickle-cell disease without crisis: Secondary | ICD-10-CM

## 2017-08-04 DIAGNOSIS — Z79891 Long term (current) use of opiate analgesic: Secondary | ICD-10-CM

## 2017-08-04 DIAGNOSIS — E559 Vitamin D deficiency, unspecified: Secondary | ICD-10-CM

## 2017-08-04 LAB — POCT URINALYSIS DIP (DEVICE)
BILIRUBIN URINE: NEGATIVE
Glucose, UA: NEGATIVE mg/dL
Hgb urine dipstick: NEGATIVE
Ketones, ur: NEGATIVE mg/dL
LEUKOCYTES UA: NEGATIVE
NITRITE: NEGATIVE
Protein, ur: NEGATIVE mg/dL
Specific Gravity, Urine: 1.015 (ref 1.005–1.030)
pH: 7 (ref 5.0–8.0)

## 2017-08-04 MED ORDER — OXYCODONE HCL 10 MG PO TABS: 10.0000 mg | ORAL_TABLET | Freq: Four times a day (QID) | ORAL | 0 refills | Status: DC | PRN

## 2017-08-04 MED ORDER — IBUPROFEN 600 MG PO TABS: 600.0000 mg | ORAL_TABLET | Freq: Three times a day (TID) | ORAL | 3 refills | Status: DC | PRN

## 2017-08-04 NOTE — Progress Notes (Signed)
Ms. Eboney Claybrook, a 22 year old female presents for a follow up of sickle cell anemia and medication management. Patient complains of frequent pain. She attributes pain to changes in weather. She says that pain intensity is 5-6/10 characterized as intermittent and throbbing. She says that she has not been hydrating as much as she should and often forgets to take hydrea, vitamin D and folic acid. She denies chest pain, shortness of breath, fatigue, paresthesias, dysuria, nausea, vomiting, or diarrhea.   Past Medical History:  Diagnosis Date  . Blood transfusion without reported diagnosis   . Bronchitis   . Chickenpox   . Depression   . Heart murmur   . Sickle cell anemia (HCC)   . Urinary tract infection    Social History   Socioeconomic History  . Marital status: Single    Spouse name: Not on file  . Number of children: Not on file  . Years of education: Not on file  . Highest education level: Not on file  Social Needs  . Financial resource strain: Not on file  . Food insecurity - worry: Not on file  . Food insecurity - inability: Not on file  . Transportation needs - medical: Not on file  . Transportation needs - non-medical: Not on file  Occupational History  . Not on file  Tobacco Use  . Smoking status: Former Research scientist (life sciences)  . Smokeless tobacco: Never Used  Substance and Sexual Activity  . Alcohol use: Yes    Alcohol/week: 0.0 oz    Comment: occ  . Drug use: No  . Sexual activity: Yes    Birth control/protection: Implant  Other Topics Concern  . Not on file  Social History Narrative   Lives with mom in a one story home.  No children.  Currently not working.  Education: 2 years of college.    Immunization History  Administered Date(s) Administered  . Influenza,inj,Quad PF,6+ Mos 07/22/2015, 06/12/2016  No Known Allergies  Review of Systems  Constitutional: Negative for chills, fever, malaise/fatigue and weight loss.  Eyes: Negative.   Respiratory: Negative.    Cardiovascular: Negative.   Gastrointestinal: Negative.   Genitourinary: Negative.   Musculoskeletal: Positive for myalgias and neck pain.  Skin: Negative.  Negative for rash.  Neurological: Negative.   Endo/Heme/Allergies: Negative.  Negative for environmental allergies and polydipsia. Does not bruise/bleed easily.  Psychiatric/Behavioral: Negative.    BP (!) 122/49 (BP Location: Left Arm, Patient Position: Sitting, Cuff Size: Normal)   Pulse 98   Temp 98.6 F (37 C) (Oral)   Resp 16   Ht 5\' 2"  (1.575 m)   Wt 128 lb (58.1 kg)   LMP 07/04/2017   BMI 23.41 kg/m   General Appearance:    Alert, cooperative, no distress, appears stated age  Head:    Normocephalic, without obvious abnormality, atraumatic  Eyes:    PERRL, conjunctiva/corneas clear, EOM's intact, fundi    benign, both eyes. Scleral icterus  Ears:    Normal TM's and external ear canals, both ears     Throat:   Lips, mucosa, and tongue normal; teeth and gums normal  Neck:   Supple, symmetrical, trachea midline, no adenopathy;    thyroid:  no enlargement/tenderness/nodules; Normal ROM    Back:     Symmetric, no curvature,  tenderness  Lungs:     Clear to auscultation bilaterally, respirations unlabored  Chest Wall:    No tenderness or deformity   Heart:    Regular rate and rhythm, S1 and  S2 normal, no murmur, rub   or gallop  Abdomen:     Soft, non-tender, bowel sounds active all four quadrants,    no masses, no organomegaly  Extremities:   Extremities normal, atraumatic, no cyanosis or edema  Pulses:   2+ and symmetric all extremities  Skin:   Skin color, texture, turgor normal, no rashes or lesions  Lymph nodes:   Cervical, supraclavicular, and axillary nodes normal       Plan   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 Ithzel was encouraged to take medication consistently. She may have to program phone for reminders to take medicatioon. We discussed the  need for good hydration, monitoring of hydration status, avoidance of heat, cold, stress, and infection triggers. We discussed the risks and benefits of Hydrea, including bone marrow suppression, the possibility of GI upset, skin ulcers, hair thinning, and teratogenicity. The patient was reminded of the need to seek medical attention of any symptoms of bleeding, anemia, or infection. Continue folic acid 1 mg daily to prevent aplastic bone marrow crises.   Discussed the importance of drinking 64 ounces of water daily. To help prevent pain crises, it is important to drink plenty of water throughout the day. This is because dehydration of red blood cells may lead to the sickling process.   Pulmonary evaluation - Patient denies severe recurrent wheezes, shortness of breath with exercise, or persistent cough. If these symptoms develop, pulmonary function tests with spirometry will be ordered, and if abnormal, plan on referral to Pulmonology for further evaluation.  Cardiac - Routine screening for pulmonary hypertension is not recommended.  Eye - High risk of proliferative retinopathy. Annual eye exam with retinal exam recommended to patient.  Immunization status - Up to date with immunizations.   Acute and chronic painful episodes - We agreed on current pain management regimen. We discussed that pt is to receive her Schedule II prescriptions only from [place]. Pt is also aware that the prescription history is available to [place] online through the [facility] CSRS. Controlled substance agreement signed previously. We reminded Joselyne that all patients receiving Schedule II narcotics must be seen for follow within one month of prescription being requested. We reviewed the terms of our pain agreement, including the need to keep medicines in a safe locked location away from children or pets, and the need to report excess sedation or constipation, measures to avoid constipation, and policies related to early refills and stolen  prescriptions. According to the [place] Chronic Pain Initiative program, we have reviewed details related to analgesia, adverse effects, aberrant behaviors.  - Oxycodone HCl 10 MG TABS; Take 1 tablet (10 mg total) by mouth every 6 (six) hours as needed.  Dispense: 60 tablet; Refill: 0 - CMP and Liver - CBC with Differential - Reticulocytes - ibuprofen (ADVIL,MOTRIN) 600 MG tablet; Take 1 tablet (600 mg total) by mouth every 8 (eight) hours as needed.  Dispense: 30 tablet; Refill: 3  2. Chronic prescription opiate use - Oxycodone HCl 10 MG TABS; Take 1 tablet (10 mg total) by mouth every 6 (six) hours as needed.  Dispense: 60 tablet; Refill: 0 - ToxASSURE Select 13 (MW), Urine  3. Vitamin D deficiency - Vitamin D, 25-hydroxy  RTC: 2 months for sickle cell anemia and medication management    [name]  MSN, FNP-C Patient [facility] [address] [place],  [address] [phone]

## 2017-08-04 NOTE — Patient Instructions (Signed)
Sickle Cell Anemia, Adult °Sickle cell anemia is a condition where your red blood cells are shaped like sickles. Red blood cells carry oxygen through the body. Sickle-shaped red blood cells do not live as long as normal red blood cells. They also clump together and block blood from flowing through the blood vessels. These things prevent the body from getting enough oxygen. Sickle cell anemia causes organ damage and pain. It also increases the risk of infection. °Follow these instructions at home: °· Drink enough fluid to keep your pee (urine) clear or pale yellow. Drink more in hot weather and during exercise. °· Do not smoke. Smoking lowers oxygen levels in the blood. °· Only take over-the-counter or prescription medicines as told by your doctor. °· Take antibiotic medicines as told by your doctor. Make sure you finish them even if you start to feel better. °· Take supplements as told by your doctor. °· Consider wearing a medical alert bracelet. This tells anyone caring for you in an emergency of your condition. °· When traveling, keep your medical information, doctors' names, and the medicines you take with you at all times. °· If you have a fever, do not take fever medicines right away. This could cover up a problem. Tell your doctor. °· Keep all follow-up visits with your doctor. Sickle cell anemia requires regular medical care. °Contact a doctor if: °You have a fever. °Get help right away if: °· You feel dizzy or faint. °· You have new belly (abdominal) pain, especially on the left side near the stomach area. °· You have a lasting, often uncomfortable and painful erection of the penis (priapism). If it is not treated right away, you will become unable to have sex (impotence). °· You have numbness in your arms or legs or you have a hard time moving them. °· You have a hard time talking. °· You have a fever or lasting symptoms for more than 2-3 days. °· You have a fever and your symptoms suddenly get  worse. °· You have signs or symptoms of infection. These include: °? Chills. °? Being more tired than normal (lethargy). °? Irritability. °? Poor eating. °? Throwing up (vomiting). °· You have pain that is not helped with medicine. °· You have shortness of breath. °· You have pain in your chest. °· You are coughing up pus-like or bloody mucus. °· You have a stiff neck. °· Your feet or hands swell or have pain. °· Your belly looks bloated. °· Your joints hurt. °This information is not intended to replace advice given to you by your health care provider. Make sure you discuss any questions you have with your health care provider. °Document Released: 05/10/2013 Document Revised: 12/26/2015 Document Reviewed: 03/01/2013 °Elsevier Interactive Patient Education © 2017 Elsevier Inc. ° °

## 2017-08-05 ENCOUNTER — Other Ambulatory Visit: Payer: Self-pay | Admitting: Family Medicine

## 2017-08-05 ENCOUNTER — Telehealth: Payer: Self-pay

## 2017-08-05 DIAGNOSIS — D571 Sickle-cell disease without crisis: Secondary | ICD-10-CM

## 2017-08-05 DIAGNOSIS — E559 Vitamin D deficiency, unspecified: Secondary | ICD-10-CM

## 2017-08-05 LAB — CBC WITH DIFFERENTIAL/PLATELET
BASOS ABS: 0.1 10*3/uL (ref 0.0–0.2)
BASOS: 1 %
EOS (ABSOLUTE): 0.1 10*3/uL (ref 0.0–0.4)
Eos: 1 %
Hematocrit: 21 % — ABNORMAL LOW (ref 34.0–46.6)
Hemoglobin: 7.2 g/dL — ABNORMAL LOW (ref 11.1–15.9)
IMMATURE GRANS (ABS): 0 10*3/uL (ref 0.0–0.1)
Immature Granulocytes: 0 %
LYMPHS: 33 %
Lymphocytes Absolute: 4.1 10*3/uL — ABNORMAL HIGH (ref 0.7–3.1)
MCH: 36.4 pg — ABNORMAL HIGH (ref 26.6–33.0)
MCHC: 34.3 g/dL (ref 31.5–35.7)
MCV: 106 fL — AB (ref 79–97)
MONOCYTES: 22 %
Monocytes Absolute: 2.8 10*3/uL — ABNORMAL HIGH (ref 0.1–0.9)
NEUTROS ABS: 5.3 10*3/uL (ref 1.4–7.0)
Neutrophils: 43 %
Platelets: 411 10*3/uL — ABNORMAL HIGH (ref 150–379)
RBC: 1.98 x10E6/uL — CL (ref 3.77–5.28)
RDW: 20.3 % — ABNORMAL HIGH (ref 12.3–15.4)
WBC: 12.4 10*3/uL — ABNORMAL HIGH (ref 3.4–10.8)

## 2017-08-05 LAB — CMP AND LIVER
ALT: 15 IU/L (ref 0–32)
AST: 41 IU/L — ABNORMAL HIGH (ref 0–40)
Albumin: 4.9 g/dL (ref 3.5–5.5)
Alkaline Phosphatase: 71 IU/L (ref 39–117)
BUN: 9 mg/dL (ref 6–20)
Bilirubin Total: 3.4 mg/dL — ABNORMAL HIGH (ref 0.0–1.2)
Bilirubin, Direct: 0.34 mg/dL (ref 0.00–0.40)
CALCIUM: 9.6 mg/dL (ref 8.7–10.2)
CO2: 18 mmol/L — AB (ref 20–29)
Chloride: 105 mmol/L (ref 96–106)
Creatinine, Ser: 0.56 mg/dL — ABNORMAL LOW (ref 0.57–1.00)
GFR, EST AFRICAN AMERICAN: 152 mL/min/{1.73_m2} (ref >59)
GFR, EST NON AFRICAN AMERICAN: 132 mL/min/{1.73_m2} (ref >59)
GLUCOSE: 68 mg/dL (ref 65–99)
Potassium: 4.9 mmol/L (ref 3.5–5.2)
Sodium: 141 mmol/L (ref 134–144)
Total Protein: 7.4 g/dL (ref 6.0–8.5)

## 2017-08-05 LAB — VITAMIN D 25 HYDROXY (VIT D DEFICIENCY, FRACTURES): VIT D 25 HYDROXY: 5.8 ng/mL — AB (ref 30.0–100.0)

## 2017-08-05 LAB — RETICULOCYTES: Retic Ct Pct: >23 % — ABNORMAL HIGH (ref 0.6–2.6)

## 2017-08-05 MED ORDER — VITAMIN D (ERGOCALCIFEROL) 1.25 MG (50000 UNIT) PO CAPS: 50000.0000 [IU] | ORAL_CAPSULE | ORAL | 1 refills | Status: DC

## 2017-08-05 NOTE — Progress Notes (Signed)
Meds ordered this encounter  Medications  . Vitamin D, Ergocalciferol, (DRISDOL) 50000 units CAPS capsule    Sig: Take 1 capsule (50,000 Units total) by mouth every Saturday. Saturday.    Dispense:  30 capsule    Refill:  1

## 2017-08-05 NOTE — Telephone Encounter (Signed)
-----   Message from Dorena Dew, [place] sent at 08/05/2017  8:11 AM EST ----- Please remind patient of the importance of taking medications consistently. She has a vitamin d deficiency, sent Drisdol, 50,000 units to pharmacy. Also, hemoglobin decreased at 7.2. Please return in 2 weeks to re-check hemoglobin.   Thanks

## 2017-08-05 NOTE — Telephone Encounter (Signed)
Patient called back and I advised of low vitamin D and that this has been sent into pharmacy and to make sure she is taking it as prescribed. Advised that hgb had decreased to 7.2 and that she should return in 2 weeks for a re-check. This has been scheduled. Thanks!

## 2017-08-05 NOTE — Progress Notes (Signed)
Meds ordered this encounter  Medications  . Vitamin D, Ergocalciferol, (DRISDOL) 50000 units CAPS capsule    Sig: Take 1 capsule (50,000 Units total) by mouth every Saturday. Saturday.    Dispense:  30 capsule    Refill:  [place]  MSN, FNP-C Patient [facility] [address] [place], [place] [address] [phone]

## 2017-08-05 NOTE — Telephone Encounter (Signed)
Called, patient was not available, her mother answered the phone and said she will have her call me when she gets home. Thanks!

## 2017-08-08 LAB — TOXASSURE SELECT 13 (MW), URINE

## 2017-08-17 ENCOUNTER — Telehealth: Payer: Self-pay | Admitting: Hematology

## 2017-08-17 NOTE — Telephone Encounter (Signed)
Spoke with patient regarding appointment D/T/Loc/Ph# °

## 2017-08-19 ENCOUNTER — Other Ambulatory Visit (INDEPENDENT_AMBULATORY_CARE_PROVIDER_SITE_OTHER): Payer: 59

## 2017-08-19 DIAGNOSIS — D571 Sickle-cell disease without crisis: Secondary | ICD-10-CM

## 2017-08-20 ENCOUNTER — Telehealth: Payer: Self-pay

## 2017-08-20 LAB — CBC
Hematocrit: 23 % — ABNORMAL LOW (ref 34.0–46.6)
Hemoglobin: 8.1 g/dL — ABNORMAL LOW (ref 11.1–15.9)
MCH: 38.9 pg — ABNORMAL HIGH (ref 26.6–33.0)
MCHC: 35.2 g/dL (ref 31.5–35.7)
MCV: 111 fL — AB (ref 79–97)
PLATELETS: 239 10*3/uL (ref 150–379)
RBC: 2.08 x10E6/uL — CL (ref 3.77–5.28)
RDW: 23.3 % — AB (ref 12.3–15.4)
WBC: 8.9 10*3/uL (ref 3.4–10.8)

## 2017-08-20 NOTE — Telephone Encounter (Signed)
Called, no answer. Left a message for patient to call back. Thanks!  

## 2017-08-20 NOTE — Telephone Encounter (Signed)
Patient returned call, I advised of improved hgba1c and that no interventions are needed at this time. Thanks!

## 2017-08-20 NOTE — Telephone Encounter (Signed)
-----   Message from Dorena Dew, [place] sent at 08/20/2017 10:33 AM EST ----- Regarding: lab results Please inform Sandra Mckay that hemoglobin has improved to 8.1. No interventions warranted at this time.  Thanks

## 2017-08-30 ENCOUNTER — Telehealth: Payer: Self-pay

## 2017-08-31 ENCOUNTER — Other Ambulatory Visit: Payer: Self-pay | Admitting: Family Medicine

## 2017-08-31 DIAGNOSIS — D571 Sickle-cell disease without crisis: Secondary | ICD-10-CM

## 2017-08-31 DIAGNOSIS — Z79891 Long term (current) use of opiate analgesic: Secondary | ICD-10-CM

## 2017-08-31 MED ORDER — OXYCODONE HCL 10 MG PO TABS: 10.0000 mg | ORAL_TABLET | Freq: Four times a day (QID) | ORAL | 0 refills | Status: DC | PRN

## 2017-08-31 NOTE — Progress Notes (Signed)
Reviewed [place] Substance Reporting system prior to prescribing opiate medications. No inconsistencies noted.   Meds ordered this encounter  Medications  . Oxycodone HCl 10 MG TABS    Sig: Take 1 tablet (10 mg total) by mouth every 6 (six) hours as needed.    Dispense:  60 tablet    Refill:  0    Order Specific Question:   Supervising Provider    Answer:   Tresa Garter [[id number]]    [name]  MSN, FNP-C Patient [facility] [address] [place], [place] [address] [phone]

## 2017-09-16 ENCOUNTER — Encounter: Payer: 59 | Admitting: Hematology

## 2017-09-16 ENCOUNTER — Telehealth: Payer: Self-pay

## 2017-09-16 NOTE — Telephone Encounter (Signed)
Called pt to question reason for missed appt. No answer, left VM on pt number provided. Pt given [facility] main line [phone] if she would like to be seen.

## 2017-10-04 ENCOUNTER — Other Ambulatory Visit: Payer: Self-pay | Admitting: Family Medicine

## 2017-10-04 ENCOUNTER — Telehealth: Payer: Self-pay

## 2017-10-04 DIAGNOSIS — D571 Sickle-cell disease without crisis: Secondary | ICD-10-CM

## 2017-10-04 DIAGNOSIS — Z79891 Long term (current) use of opiate analgesic: Secondary | ICD-10-CM

## 2017-10-04 MED ORDER — OXYCODONE HCL 10 MG PO TABS: 10.0000 mg | ORAL_TABLET | Freq: Four times a day (QID) | ORAL | 0 refills | Status: DC | PRN

## 2017-10-04 NOTE — Progress Notes (Signed)
Reviewed [place] Substance Reporting system prior to prescribing opiate medications. No inconsistencies noted.    Meds ordered this encounter  Medications  . Oxycodone HCl 10 MG TABS    Sig: Take 1 tablet (10 mg total) by mouth every 6 (six) hours as needed.    Dispense:  60 tablet    Refill:  0    Order Specific Question:   Supervising Provider    Answer:   Tresa Garter [[id number]]    Sandra Pounds  MSN, FNP-C [name] [address] [place], [place] [address] [phone]

## 2017-10-25 ENCOUNTER — Telehealth: Payer: Self-pay

## 2017-10-25 NOTE — Telephone Encounter (Signed)
Patient called saying she had a blood transfusion in [place] yesterday 10/24/2017 and they told her to follow up with [place] this week. She wants to know if she can come in for a lab only appointment on Friday 10/29/2017 and keep her next scheduled appointment with [place] for 11/15/2017? Please advise if this is acceptable and if so can you order lab for her hgb? Please advise. Thanks!

## 2017-10-26 ENCOUNTER — Other Ambulatory Visit: Payer: Self-pay | Admitting: Family Medicine

## 2017-10-26 DIAGNOSIS — D571 Sickle-cell disease without crisis: Secondary | ICD-10-CM

## 2017-10-26 NOTE — Progress Notes (Signed)
  Lab Orders     CBC with Differential    Sandra Pounds  MSN, FNP-C Patient [facility] [address] [place], [place] [address] [phone]

## 2017-10-29 ENCOUNTER — Other Ambulatory Visit: Payer: 59

## 2017-11-04 ENCOUNTER — Telehealth: Payer: Self-pay

## 2017-11-05 ENCOUNTER — Other Ambulatory Visit: Payer: 59

## 2017-11-05 ENCOUNTER — Telehealth: Payer: Self-pay

## 2017-11-05 DIAGNOSIS — Z79891 Long term (current) use of opiate analgesic: Secondary | ICD-10-CM

## 2017-11-05 DIAGNOSIS — D571 Sickle-cell disease without crisis: Secondary | ICD-10-CM

## 2017-11-05 MED ORDER — OXYCODONE HCL 10 MG PO TABS: 10.0000 mg | ORAL_TABLET | Freq: Four times a day (QID) | ORAL | 0 refills | Status: DC | PRN

## 2017-11-05 NOTE — Telephone Encounter (Signed)
Refilled oxycodone 10 mg qt 60 fill date today

## 2017-11-06 LAB — CBC WITH DIFFERENTIAL/PLATELET
BASOS ABS: 0.1 10*3/uL (ref 0.0–0.2)
Basos: 1 %
EOS (ABSOLUTE): 0 10*3/uL (ref 0.0–0.4)
Eos: 0 %
Hematocrit: 25.9 % — ABNORMAL LOW (ref 34.0–46.6)
Hemoglobin: 8.6 g/dL — ABNORMAL LOW (ref 11.1–15.9)
IMMATURE GRANS (ABS): 0 10*3/uL (ref 0.0–0.1)
IMMATURE GRANULOCYTES: 0 %
LYMPHS: 26 %
Lymphocytes Absolute: 2.9 10*3/uL (ref 0.7–3.1)
MCH: 34.1 pg — ABNORMAL HIGH (ref 26.6–33.0)
MCHC: 33.2 g/dL (ref 31.5–35.7)
MCV: 103 fL — ABNORMAL HIGH (ref 79–97)
Monocytes Absolute: 1.5 10*3/uL — ABNORMAL HIGH (ref 0.1–0.9)
Monocytes: 14 %
NEUTROS PCT: 59 %
Neutrophils Absolute: 6.4 10*3/uL (ref 1.4–7.0)
PLATELETS: 501 10*3/uL — AB (ref 150–379)
RBC: 2.52 x10E6/uL — AB (ref 3.77–5.28)
RDW: 23.3 % — AB (ref 12.3–15.4)
WBC: 10.9 10*3/uL — ABNORMAL HIGH (ref 3.4–10.8)

## 2017-11-08 ENCOUNTER — Other Ambulatory Visit: Payer: Self-pay | Admitting: Family Medicine

## 2017-11-08 ENCOUNTER — Telehealth: Payer: Self-pay

## 2017-11-08 DIAGNOSIS — Z79891 Long term (current) use of opiate analgesic: Secondary | ICD-10-CM

## 2017-11-08 DIAGNOSIS — D571 Sickle-cell disease without crisis: Secondary | ICD-10-CM

## 2017-11-08 MED ORDER — OXYCODONE HCL 10 MG PO TABS: 10.0000 mg | ORAL_TABLET | Freq: Four times a day (QID) | ORAL | 0 refills | Status: DC | PRN

## 2017-11-08 NOTE — Progress Notes (Signed)
Reviewed [place] Substance Reporting system prior to prescribing opiate medications. No inconsistencies noted.   Meds ordered this encounter  Medications  . Oxycodone HCl 10 MG TABS    Sig: Take 1 tablet (10 mg total) by mouth every 6 (six) hours as needed.    Dispense:  60 tablet    Refill:  0    Ok to print today    Order Specific Question:   Supervising Provider    Answer:   Tresa Garter [[id number]]    [name]  MSN, FNP-C Patient [facility] [address] [place], [place] [address] [phone]

## 2017-11-08 NOTE — Telephone Encounter (Signed)
-----   Message from Dorena Dew, [place] sent at 11/07/2017  5:32 PM EDT ----- Regarding: lab results Please inform patient that hemoglobin is 8.6, which is consistent with baseline. No transfusion warranted at this time.   [name]  MSN, FNP-C [facility] [address] [place], [place] [address] [phone]

## 2017-11-08 NOTE — Telephone Encounter (Signed)
Called no answer. Left a message that hemoglobin was 8.6 which is consistent with baseline and no transfusion is needed at this time. Asked if any questions to call back to our office and left call back number. Thanks!

## 2017-11-15 ENCOUNTER — Ambulatory Visit: Payer: 59 | Admitting: Family Medicine

## 2017-12-08 ENCOUNTER — Other Ambulatory Visit: Payer: 59

## 2017-12-08 DIAGNOSIS — D571 Sickle-cell disease without crisis: Secondary | ICD-10-CM

## 2017-12-08 DIAGNOSIS — Z79891 Long term (current) use of opiate analgesic: Secondary | ICD-10-CM

## 2017-12-14 LAB — 737588 9+OXYCODONE+CRT-UNBUND
Amphetamine Scrn, Ur: NEGATIVE ng/mL
BARBITURATE SCREEN URINE: NEGATIVE ng/mL
CREATININE(CRT), U: 105.4 mg/dL (ref 20.0–300.0)
METHADONE SCREEN, URINE: NEGATIVE ng/mL
OXYCODONE+OXYMORPHONE UR QL SCN: NEGATIVE ng/mL
Opiate Scrn, Ur: NEGATIVE ng/mL
PH UR, DRUG SCRN: 5.8 (ref 4.5–8.9)
PHENCYCLIDINE QUANTITATIVE URINE: NEGATIVE ng/mL
PROPOXYPHENE SCREEN URINE: NEGATIVE ng/mL

## 2017-12-14 LAB — BENZODIAZEPINES CONFIRM, URINE
ALPRAZOLAM: NEGATIVE
BENZODIAZEPINES: NEGATIVE ng/mL
CLONAZEPAM: NEGATIVE
FLURAZEPAM UR: NEGATIVE
LORAZEPAM: NEGATIVE
MIDAZOLAM: NEGATIVE
NORDIAZEPAM: NEGATIVE
Oxazepam: NEGATIVE
TEMAZEPAM: NEGATIVE
TRIAZOLAM: NEGATIVE

## 2017-12-14 LAB — CANNABINOID (GC/MS), URINE
Cannabinoid: POSITIVE — AB
Carboxy THC (GC/MS): >400 ng/mL

## 2017-12-14 LAB — COCAINE (GC/MS), URINE
BENZOYLECGONINE (GC/MS): 17640 ng/mL
COCAINE + METABOLITE: POSITIVE — AB

## 2017-12-25 DIAGNOSIS — N920 Excessive and frequent menstruation with regular cycle: Secondary | ICD-10-CM | POA: Insufficient documentation

## 2017-12-25 DIAGNOSIS — Z720 Tobacco use: Secondary | ICD-10-CM | POA: Insufficient documentation

## 2017-12-25 DIAGNOSIS — D75839 Thrombocytosis, unspecified: Secondary | ICD-10-CM | POA: Insufficient documentation

## 2017-12-25 DIAGNOSIS — D72829 Elevated white blood cell count, unspecified: Secondary | ICD-10-CM | POA: Insufficient documentation

## 2017-12-25 MED ORDER — SODIUM CHLORIDE 0.9 % IV SOLN
10.00 | INTRAVENOUS | Status: DC
Start: ? — End: 2017-12-25

## 2017-12-28 MED ORDER — OXYCODONE HCL 5 MG PO TABS
10.00 | ORAL_TABLET | ORAL | Status: DC
Start: ? — End: 2017-12-28

## 2017-12-28 MED ORDER — ACETAMINOPHEN 650 MG RE SUPP
650.00 | RECTAL | Status: DC
Start: ? — End: 2017-12-28

## 2017-12-28 MED ORDER — GENERIC EXTERNAL MEDICATION
Status: DC
Start: ? — End: 2017-12-28

## 2017-12-28 MED ORDER — HYDROMORPHONE HCL 1 MG/ML IJ SOLN
0.50 | INTRAMUSCULAR | Status: DC
Start: ? — End: 2017-12-28

## 2017-12-28 MED ORDER — HYDROXYUREA 500 MG PO CAPS
1500.00 | ORAL_CAPSULE | ORAL | Status: DC
Start: 2017-12-26 — End: 2017-12-28

## 2017-12-28 MED ORDER — ONDANSETRON HCL 4 MG/2ML IJ SOLN
4.00 | INTRAMUSCULAR | Status: DC
Start: ? — End: 2017-12-28

## 2017-12-28 MED ORDER — VITAMIN D (ERGOCALCIFEROL) 1.25 MG (50000 UNIT) PO CAPS
50000.00 | ORAL_CAPSULE | ORAL | Status: DC
Start: ? — End: 2017-12-28

## 2017-12-28 MED ORDER — HYDRALAZINE HCL 20 MG/ML IJ SOLN
10.00 | INTRAMUSCULAR | Status: DC
Start: ? — End: 2017-12-28

## 2017-12-28 MED ORDER — SODIUM CHLORIDE 0.9 % IV SOLN
10.00 | INTRAVENOUS | Status: DC
Start: ? — End: 2017-12-28

## 2017-12-28 MED ORDER — ACETAMINOPHEN 325 MG PO TABS
650.00 | ORAL_TABLET | ORAL | Status: DC
Start: ? — End: 2017-12-28

## 2017-12-28 MED ORDER — HYDROXYUREA 300 MG PO CAPS
300.00 | ORAL_CAPSULE | ORAL | Status: DC
Start: 2017-12-26 — End: 2017-12-28

## 2017-12-28 MED ORDER — FOLIC ACID 1 MG PO TABS
1.00 | ORAL_TABLET | ORAL | Status: DC
Start: 2017-12-27 — End: 2017-12-28

## 2017-12-28 MED ORDER — NICOTINE 14 MG/24HR TD PT24
1.00 | MEDICATED_PATCH | TRANSDERMAL | Status: DC
Start: 2017-12-27 — End: 2017-12-28

## 2017-12-28 MED ORDER — ENOXAPARIN SODIUM 40 MG/0.4ML ~~LOC~~ SOLN
40.00 | SUBCUTANEOUS | Status: DC
Start: 2017-12-27 — End: 2017-12-28

## 2017-12-31 ENCOUNTER — Ambulatory Visit: Payer: 59 | Admitting: Family Medicine

## 2018-03-23 ENCOUNTER — Ambulatory Visit (INDEPENDENT_AMBULATORY_CARE_PROVIDER_SITE_OTHER): Payer: BLUE CROSS/BLUE SHIELD | Admitting: Family Medicine

## 2018-03-23 ENCOUNTER — Telehealth: Payer: Self-pay

## 2018-03-23 ENCOUNTER — Ambulatory Visit: Payer: 59 | Admitting: Family Medicine

## 2018-03-23 ENCOUNTER — Encounter: Payer: Self-pay | Admitting: Family Medicine

## 2018-03-23 VITALS — BP 106/68 | HR 66 | Temp 98.7°F | Ht 62.0 in | Wt 121.0 lb

## 2018-03-23 DIAGNOSIS — Z23 Encounter for immunization: Secondary | ICD-10-CM | POA: Diagnosis not present

## 2018-03-23 DIAGNOSIS — G894 Chronic pain syndrome: Secondary | ICD-10-CM | POA: Diagnosis not present

## 2018-03-23 DIAGNOSIS — D571 Sickle-cell disease without crisis: Secondary | ICD-10-CM

## 2018-03-23 DIAGNOSIS — Z79891 Long term (current) use of opiate analgesic: Secondary | ICD-10-CM

## 2018-03-23 DIAGNOSIS — R630 Anorexia: Secondary | ICD-10-CM

## 2018-03-23 DIAGNOSIS — R110 Nausea: Secondary | ICD-10-CM | POA: Diagnosis not present

## 2018-03-23 DIAGNOSIS — Z0189 Encounter for other specified special examinations: Secondary | ICD-10-CM

## 2018-03-23 DIAGNOSIS — Z09 Encounter for follow-up examination after completed treatment for conditions other than malignant neoplasm: Secondary | ICD-10-CM

## 2018-03-23 DIAGNOSIS — E559 Vitamin D deficiency, unspecified: Secondary | ICD-10-CM

## 2018-03-23 LAB — POCT URINALYSIS DIP (MANUAL ENTRY)
Glucose, UA: NEGATIVE mg/dL
Ketones, POC UA: NEGATIVE mg/dL
Leukocytes, UA: NEGATIVE
Nitrite, UA: NEGATIVE
Spec Grav, UA: 1.015 (ref 1.010–1.025)
Urobilinogen, UA: 4 E.U./dL — AB
pH, UA: 7.5 (ref 5.0–8.0)

## 2018-03-23 MED ORDER — L-GLUTAMINE ORAL POWDER: 5.0000 g | PACK | Freq: Two times a day (BID) | ORAL | 5 refills | Status: DC

## 2018-03-23 MED ORDER — ONDANSETRON HCL 4 MG PO TABS: 4.0000 mg | ORAL_TABLET | Freq: Three times a day (TID) | ORAL | 5 refills | Status: DC | PRN

## 2018-03-23 MED ORDER — OXYCODONE HCL 10 MG PO TABS: 10.0000 mg | ORAL_TABLET | Freq: Four times a day (QID) | ORAL | 0 refills | Status: DC | PRN

## 2018-03-23 MED ORDER — HYDROXYUREA 500 MG PO CAPS: ORAL_CAPSULE | ORAL | 3 refills | Status: DC

## 2018-03-23 MED ORDER — MEGESTROL ACETATE 20 MG PO TABS: 20.0000 mg | ORAL_TABLET | Freq: Every day | ORAL | 1 refills | Status: DC

## 2018-03-23 MED ORDER — FOLIC ACID 1 MG PO TABS: 1.0000 mg | ORAL_TABLET | Freq: Every day | ORAL | 3 refills | Status: DC

## 2018-03-23 MED ORDER — VITAMIN D (ERGOCALCIFEROL) 1.25 MG (50000 UNIT) PO CAPS: 50000.0000 [IU] | ORAL_CAPSULE | ORAL | 5 refills | Status: DC

## 2018-03-23 MED ORDER — IBUPROFEN 600 MG PO TABS: 600.0000 mg | ORAL_TABLET | Freq: Three times a day (TID) | ORAL | 3 refills | Status: DC | PRN

## 2018-03-23 NOTE — Progress Notes (Signed)
Sickle Cell Hospital Follow Up   Subjective:    Patient ID: Sandra Mckay, female    DOB: 08-02-1996, 22 y.o.   MRN: [id number]  Chief Complaint  Patient presents with  . Follow-up    sickle cell   HPI  Sandra Mckay has a past medical history of Sickle Cell Anemia, UTI, Heart Murmur, Depression, and Bronchitis. She is here for hospital follow up today.    Current Status: Since her last office visit, she has a new job and has gotten her insurance coverage. She has not been compliant on her medications because of calling out of work. She has been trying to work without taking pain medications, until she can no longer take the pain, which she then goes to ER in pain crisis. She is here today to get re-established on all her meds and follow up appointments. She is accompanied today by her mother. She states that she has pain in her knees, ankles, back, and legs. She rates her pain today at 5/10. She has had recent hospital visit for Sickle Cell Crisis 5 times in the past few months,  where she was treated and discharged the same day. She is not currently staying well hydrated a advised, but she is willing to be compliant with keeping herself hydrated to aide in preventing future crisis. She  reports occasional dizziness, shortness of breath, and headaches.   She denies fevers, chills, fatigue, recent infections, weight loss, and night sweats.   She has not had any visual changes, and falls.   No chest pain, heart palpitations, and cough reported.   No reports of GI problems such as vomiting, diarrhea, and constipation. She has no reports of blood in stools, dysuria and hematuria.   She does report moderate situational anxiety today.    Past Medical History:  Diagnosis Date  . Blood transfusion without reported diagnosis   . Bronchitis   . Chickenpox   . Depression   . Heart murmur   . Sickle cell anemia (HCC)   . Urinary tract infection     Family History  Problem Relation Age of  Onset  . Arthritis Other        grandparent  . Stroke Other   . Hypertension Other   . Diabetes Unknown        grandparent  . Cancer - Other Unknown        Glioblastoma    Social History   Socioeconomic History  . Marital status: Single    Spouse name: Not on file  . Number of children: Not on file  . Years of education: Not on file  . Highest education level: Not on file  Occupational History  . Not on file  Social Needs  . Financial resource strain: Not on file  . Food insecurity:    Worry: Not on file    Inability: Not on file  . Transportation needs:    Medical: Not on file    Non-medical: Not on file  Tobacco Use  . Smoking status: Former Research scientist (life sciences)  . Smokeless tobacco: Never Used  Substance and Sexual Activity  . Alcohol use: Yes    Alcohol/week: 0.0 standard drinks    Comment: occ  . Drug use: No  . Sexual activity: Yes    Birth control/protection: Implant  Lifestyle  . Physical activity:    Days per week: Not on file    Minutes per session: Not on file  . Stress: Not on file  Relationships  .  Social connections:    Talks on phone: Not on file    Gets together: Not on file    Attends religious service: Not on file    Active member of club or organization: Not on file    Attends meetings of clubs or organizations: Not on file    Relationship status: Not on file  . Intimate partner violence:    Fear of current or ex partner: Not on file    Emotionally abused: Not on file    Physically abused: Not on file    Forced sexual activity: Not on file  Other Topics Concern  . Not on file  Social History Narrative   Lives with mom in a one story home.  No children.  Currently not working.  Education: 2 years of college.     Past Surgical History:  Procedure Laterality Date  . CHOLECYSTECTOMY  2011  . EYE SURGERY     Sty removal  . LABIAL ADHESION LYSIS  1999  . 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 TONSILLECTOMY  2012     Immunization History  Administered Date(s) Administered  . Influenza,inj,Quad PF,6+ Mos 07/22/2015, 06/12/2016, 03/23/2018   Allergies  Allergen Reactions  . Latex Rash    BP 106/68 (BP Location: Left Arm, Patient Position: Sitting, Cuff Size: Small)   Pulse 66   Temp 98.7 F (37.1 C) (Oral)   Ht 5\' 2"  (1.575 m)   Wt 121 lb (54.9 kg)   LMP 03/19/2018   SpO2 97%   BMI 22.13 kg/m   Review of Systems  Constitutional: Negative.   HENT: Negative.   Eyes: Negative.   Respiratory: Negative.   Cardiovascular: Negative.   Gastrointestinal: Negative.   Endocrine: Negative.   Genitourinary: Negative.   Musculoskeletal: Negative.   Skin: Negative.   Neurological: Negative.   Hematological: Negative.   Psychiatric/Behavioral: Negative.    Objective:   Physical Exam  Constitutional: She is oriented to person, place, and time. She appears well-developed and well-nourished.  HENT:  Head: Normocephalic and atraumatic.  Right Ear: External ear normal.  Left Ear: External ear normal.  Nose: Nose normal.  Mouth/Throat: Oropharynx is clear and moist.  Eyes: Pupils are equal, round, and reactive to light. Conjunctivae and EOM are normal.  Neck: Normal range of motion. Neck supple.  Cardiovascular: Normal rate, regular rhythm, normal heart sounds and intact distal pulses.  Pulmonary/Chest: Effort normal and breath sounds normal.  Abdominal: Soft. Bowel sounds are normal.  Musculoskeletal: Normal range of motion.  Neurological: She is alert and oriented to person, place, and time.  Skin: Skin is warm and dry. Capillary refill takes less than 2 seconds.  Psychiatric: She has a normal mood and affect. Her behavior is normal. Judgment and thought content normal.  Nursing note and vitals reviewed.  Assessment & Plan:   1. Hb-SS disease without crisis [facility]) She is doing well today. She will continue to take pain medications as prescribed; will continue to avoid extreme heat and  cold; will continue to eat a healthy diet and drink at least 64 ounces of water daily; will avoid colds and flu; will continue to get plenty of sleep and rest; will continue to avoid high stressful situations and remain infection free; will continue Folic Acid 1 mg daily to avoid sickle cell crisis.  - POCT urinalysis dipstick - megestrol (MEGACE) 20 MG tablet; Take 1 tablet (20 mg total) by mouth daily.  Dispense: 30  tablet; Refill: 1 - folic acid (FOLVITE) 1 MG tablet; Take 1 tablet (1 mg total) by mouth daily.  Dispense: 90 tablet; Refill: 3 - hydroxyurea (HYDREA) 500 MG capsule; TAKE 3 CAPSULES (1,500 MG TOTAL) BY MOUTH DAILY.  Dispense: 90 capsule; Refill: 3 - ibuprofen (ADVIL,MOTRIN) 600 MG tablet; Take 1 tablet (600 mg total) by mouth every 8 (eight) hours as needed.  Dispense: 30 tablet; Refill: 3 - L-glutamine (ENDARI) 5 g PACK Powder Packet; Take 5 g by mouth 2 (two) times daily.  Dispense: 120 each; Refill: 5 - Oxycodone HCl 10 MG TABS; Take 1 tablet (10 mg total) by mouth every 6 (six) hours as needed.  Dispense: 60 tablet; Refill: 0  2. Chronic pain syndrome Pain is stable today at 5/10.   3. Chronic prescription opiate use - Oxycodone HCl 10 MG TABS; Take 1 tablet (10 mg total) by mouth every 6 (six) hours as needed.  Dispense: 60 tablet; Refill: 0  4. Nausea without vomiting Stable.  - ondansetron (ZOFRAN) 4 MG tablet; Take 1 tablet (4 mg total) by mouth every 8 (eight) hours as needed for nausea or vomiting.  Dispense: 20 tablet; Refill: 5  5. Need for immunization against influenza Injection administered in office today. - Flu Vaccine QUAD 36+ mos IM  6. Vitamin D deficiency - Vitamin D, Ergocalciferol, (DRISDOL) 50000 units CAPS capsule; Take 1 capsule (50,000 Units total) by mouth every Saturday. Saturday.  Dispense: 30 capsule; Refill: 5  7. Encounter for blood and urine testing  We will get labs in 2 weeks.   8. Decreased appetite We will initiate her on Megace  today.   9. Follow up She will follow up in 2 weeks for labs and to sign pain contract.  She will follow up in 2 months for assessment of management of Sickle Cell.   Meds ordered this encounter  Medications  . megestrol (MEGACE) 20 MG tablet    Sig: Take 1 tablet (20 mg total) by mouth daily.    Dispense:  30 tablet    Refill:  1  . folic acid (FOLVITE) 1 MG tablet    Sig: Take 1 tablet (1 mg total) by mouth daily.    Dispense:  90 tablet    Refill:  3  . hydroxyurea (HYDREA) 500 MG capsule    Sig: TAKE 3 CAPSULES (1,500 MG TOTAL) BY MOUTH DAILY.    Dispense:  90 capsule    Refill:  3  . ibuprofen (ADVIL,MOTRIN) 600 MG tablet    Sig: Take 1 tablet (600 mg total) by mouth every 8 (eight) hours as needed.    Dispense:  30 tablet    Refill:  3  . L-glutamine (ENDARI) 5 g PACK Powder Packet    Sig: Take 5 g by mouth 2 (two) times daily.    Dispense:  120 each    Refill:  5  . ondansetron (ZOFRAN) 4 MG tablet    Sig: Take 1 tablet (4 mg total) by mouth every 8 (eight) hours as needed for nausea or vomiting.    Dispense:  20 tablet    Refill:  5  . Oxycodone HCl 10 MG TABS    Sig: Take 1 tablet (10 mg total) by mouth every 6 (six) hours as needed.    Dispense:  60 tablet    Refill:  0    Ok to print today    Order Specific Question:   Supervising Provider    Answer:   Doreene Burke,  OLUGBEMIGA E [[id number]]  . Vitamin D, Ergocalciferol, (DRISDOL) 50000 units CAPS capsule    Sig: Take 1 capsule (50,000 Units total) by mouth every Saturday. Saturday.    Dispense:  30 capsule    Refill:  5    Referral Orders     Ambulatory referral to Hematology   [name],  MSN, FNP-C Patient [facility] [address] [place], [place] [address] [phone]

## 2018-03-23 NOTE — Patient Instructions (Signed)
Megestrol tablets What is this medicine? MEGESTROL (me JES trol) belongs to a class of drugs known as progestins. Megestrol tablets are used to treat advanced breast or endometrial cancer. This medicine may be used for other purposes; ask your health care provider or pharmacist if you have questions. COMMON BRAND NAME(S): Megace What should I tell my health care provider before I take this medicine? They need to know if you have any of these conditions: -adrenal gland problems -history of blood clots of the legs, lungs, or other parts of the body -diabetes -kidney disease -liver disease -stroke -an unusual or allergic reaction to megestrol, other medicines, foods, dyes, or preservatives -pregnant or trying to get pregnant -breast-feeding How should I use this medicine? Take this medicine by mouth. Follow the directions on the prescription label. Do not take your medicine more often than directed. Take your doses at regular intervals. Do not stop taking except on the advice of your doctor or health care professional. Talk to your pediatrician regarding the use of this medicine in children. Special care may be needed. Overdosage: If you think you have taken too much of this medicine contact a poison control center or emergency room at once. NOTE: This medicine is only for you. Do not share this medicine with others. What if I miss a dose? If you miss a dose, take it as soon as you can. If it is almost time for your next dose, take only that dose. Do not take double or extra doses. What may interact with this medicine? Do not take this medicine with any of the following medications: -dofetilide This medicine may also interact with the following medications: -carbamazepine -indinavir -phenobarbital -phenytoin -primidone -rifampin -warfarin This list may not describe all possible interactions. Give your health care provider a list of all the medicines, herbs, non-prescription drugs, or  dietary supplements you use. Also tell them if you smoke, drink alcohol, or use illegal drugs. Some items may interact with your medicine. What should I watch for while using this medicine? Visit your doctor or health care professional for regular checks on your progress. Continue taking this medicine even if you feel better. It may take 2 months of regular use before you know if this medicine is working for your condition. If you are a female of child-bearing age, use an effective method of birth control while you are taking this medicine. This medicine should not be used by females who are pregnant or breast-feeding. There is a potential for serious side effects to an unborn child or to an infant. Talk to your health care professional or pharmacist for more information. If you have diabetes, this medicine may affect blood sugar levels. Check your blood sugar and talk to your doctor or health care professional if you notice changes. What side effects may I notice from receiving this medicine? Side effects that you should report to your doctor or health care professional as soon as possible: -difficulty breathing or shortness of breath -chest pain -dizziness -fluid retention -increased blood pressure -leg pain or swelling -nausea and vomiting -skin rash or itching -weakness Side effects that usually do not require medical attention (report to your doctor or health care professional if they continue or are bothersome): -breakthrough menstrual bleeding -hot flashes or flushing -increased appetite -mood changes -sweating -weight gain This list may not describe all possible side effects. Call your doctor for medical advice about side effects. You may report side effects to FDA at 1-800-FDA-1088. Where should I keep   my medicine? Keep out of the reach of children. Store at controlled room temperature between 15 and 30 degrees C (59 and 86 degrees F). Protect from heat above 40 degrees C (104  degrees F). Throw away any unused medicine after the expiration date. NOTE: This sheet is a summary. It may not cover all possible information. If you have questions about this medicine, talk to your doctor, pharmacist, or health care provider.  2018 Elsevier/Gold Standard (2008-02-06 15:57:10)  

## 2018-03-23 NOTE — Telephone Encounter (Signed)
Patient notified that script would be sent before end of day.

## 2018-03-24 ENCOUNTER — Ambulatory Visit: Payer: Self-pay | Admitting: Family Medicine

## 2018-03-24 NOTE — Telephone Encounter (Signed)
-----   Message from Azzie Glatter, [place] sent at 03/23/2018 10:53 PM EDT ----- Regarding: "Follow Up" Sandra Mckay,   Please make schedule patient appointment for labs and to sign pain contract in 2 weeks. Labs orders are in. Please schedule her for any day except Thursdays.   Thank you.

## 2018-03-24 NOTE — Telephone Encounter (Signed)
Patient scheduled for 9/13

## 2018-04-15 ENCOUNTER — Other Ambulatory Visit: Payer: BLUE CROSS/BLUE SHIELD

## 2018-04-26 ENCOUNTER — Telehealth: Payer: Self-pay

## 2018-04-28 ENCOUNTER — Other Ambulatory Visit: Payer: Self-pay

## 2018-04-28 ENCOUNTER — Other Ambulatory Visit: Payer: BLUE CROSS/BLUE SHIELD

## 2018-04-28 ENCOUNTER — Other Ambulatory Visit: Payer: Self-pay | Admitting: Family Medicine

## 2018-04-28 DIAGNOSIS — D571 Sickle-cell disease without crisis: Secondary | ICD-10-CM

## 2018-04-28 DIAGNOSIS — Z79891 Long term (current) use of opiate analgesic: Secondary | ICD-10-CM

## 2018-04-28 DIAGNOSIS — Z0189 Encounter for other specified special examinations: Secondary | ICD-10-CM

## 2018-04-28 MED ORDER — OXYCODONE HCL 10 MG PO TABS: 10.0000 mg | ORAL_TABLET | Freq: Four times a day (QID) | ORAL | 0 refills | Status: DC | PRN

## 2018-04-28 NOTE — Progress Notes (Unsigned)
Rx for Oxy-IR sent to pharmacy today.  

## 2018-04-28 NOTE — Telephone Encounter (Signed)
Patient notified

## 2018-04-28 NOTE — Telephone Encounter (Signed)
Patient was in office today signing contract and asking if her script had been sent yet. It was requested on the 24th. Patient is aware that you are out of office.

## 2018-04-28 NOTE — Telephone Encounter (Signed)
-----   Message from Azzie Glatter, [place] sent at 04/28/2018  3:57 PM EDT ----- Regarding: "Refill" Sandra Mckay,   Please inform patient that Rx for Oxy-IR sent to pharmacy today.    Thank you.

## 2018-05-04 LAB — 737588 9+OXYCODONE+CRT-UNBUND
Amphetamine Scrn, Ur: NEGATIVE ng/mL
BARBITURATE SCREEN URINE: NEGATIVE ng/mL
BENZODIAZEPINE SCREEN, URINE: NEGATIVE ng/mL
Cocaine (Metab) Scrn, Ur: NEGATIVE ng/mL
Creatinine(Crt), U: 60.7 mg/dL (ref 20.0–300.0)
Methadone Screen, Urine: NEGATIVE ng/mL
OXYCODONE+OXYMORPHONE UR QL SCN: NEGATIVE ng/mL
Opiate Scrn, Ur: NEGATIVE ng/mL
Ph of Urine: 7.7 (ref 4.5–8.9)
Phencyclidine Qn, Ur: NEGATIVE ng/mL
Propoxyphene Scrn, Ur: NEGATIVE ng/mL

## 2018-05-04 LAB — BENZODIAZEPINES (GC/LC/MS), URINE

## 2018-05-04 LAB — CANNABINOID (GC/MS), URINE
Cannabinoid: POSITIVE — AB
Carboxy THC (GC/MS): 365 ng/mL

## 2018-05-23 ENCOUNTER — Encounter: Payer: Self-pay | Admitting: Family Medicine

## 2018-05-23 ENCOUNTER — Ambulatory Visit (INDEPENDENT_AMBULATORY_CARE_PROVIDER_SITE_OTHER): Payer: BLUE CROSS/BLUE SHIELD | Admitting: Family Medicine

## 2018-05-23 VITALS — BP 116/66 | HR 90 | Temp 97.9°F | Ht 62.0 in | Wt 127.0 lb

## 2018-05-23 DIAGNOSIS — Z79891 Long term (current) use of opiate analgesic: Secondary | ICD-10-CM

## 2018-05-23 DIAGNOSIS — Z09 Encounter for follow-up examination after completed treatment for conditions other than malignant neoplasm: Secondary | ICD-10-CM | POA: Diagnosis not present

## 2018-05-23 DIAGNOSIS — D571 Sickle-cell disease without crisis: Secondary | ICD-10-CM | POA: Diagnosis not present

## 2018-05-23 DIAGNOSIS — G894 Chronic pain syndrome: Secondary | ICD-10-CM

## 2018-05-23 LAB — POCT URINALYSIS DIP (MANUAL ENTRY)
Bilirubin, UA: NEGATIVE
Blood, UA: NEGATIVE
Glucose, UA: NEGATIVE mg/dL
Ketones, POC UA: NEGATIVE mg/dL
Leukocytes, UA: NEGATIVE
Nitrite, UA: NEGATIVE
Protein Ur, POC: NEGATIVE mg/dL
Spec Grav, UA: 1.015 (ref 1.010–1.025)
Urobilinogen, UA: 2 E.U./dL — AB
pH, UA: 7 (ref 5.0–8.0)

## 2018-05-23 MED ORDER — L-GLUTAMINE ORAL POWDER: 5.0000 g | PACK | Freq: Two times a day (BID) | ORAL | 5 refills | Status: DC

## 2018-05-23 NOTE — Progress Notes (Signed)
Follow Up  Subjective:    Patient ID: Sandra Mckay, female    DOB: [birth year], 22 y.o.   MRN: [id number]  No chief complaint on file.  HPI  Ms. Woehrle is a 22 year old female with a past medical history of Sickle Cell Anemia, Heart Murmur, Depression, a history of UTIs, and Bronchitis. She is here today for follow up and assessment of her chronic diseases.   Current Status: Since her last office visit, she is doing well with no complaints. She states that has pain in her lower back. She rates his pain today at 5/10. She has not has a hospital visit for Sickle Cell Crisis since 02/24/2018 where she treated and discharged the same day. She is currently taking all medications as prescribed and staying well hydrated. She reports occasional dizziness and headaches. She has occasional nausea.   She denies fevers, chills, fatigue, recent infections, weight loss, and night sweats. She has not had any headaches, visual changes, dizziness, and falls. No chest pain, heart palpitations, cough and shortness of breath reported. No reports of GI problems such as vomiting, diarrhea, and constipation. She has no reports of blood in stools, dysuria and hematuria. No depression or anxiety reported.   Past Medical History:  Diagnosis Date  . Blood transfusion without reported diagnosis   . Bronchitis   . Chickenpox   . Depression   . Heart murmur   . Sickle cell anemia (HCC)   . Urinary tract infection     Family History  Problem Relation Age of Onset  . Arthritis Other        grandparent  . Stroke Other   . Hypertension Other   . Diabetes Unknown        grandparent  . Cancer - Other Unknown        Glioblastoma    Social History   Socioeconomic History  . Marital status: Single    Spouse name: Not on file  . Number of children: Not on file  . Years of education: Not on file  . Highest education level: Not on file  Occupational History  . Not on file  Social Needs  . Financial  resource strain: Not on file  . Food insecurity:    Worry: Not on file    Inability: Not on file  . Transportation needs:    Medical: Not on file    Non-medical: Not on file  Tobacco Use  . Smoking status: Former Research scientist (life sciences)  . Smokeless tobacco: Never Used  Substance and Sexual Activity  . Alcohol use: Yes    Alcohol/week: 0.0 standard drinks    Comment: occ  . Drug use: No  . Sexual activity: Yes    Birth control/protection: Implant  Lifestyle  . Physical activity:    Days per week: Not on file    Minutes per session: Not on file  . Stress: Not on file  Relationships  . Social connections:    Talks on phone: Not on file    Gets together: Not on file    Attends religious service: Not on file    Active member of club or organization: Not on file    Attends meetings of clubs or organizations: Not on file    Relationship status: Not on file  . Intimate partner violence:    Fear of current or ex partner: Not on file    Emotionally abused: Not on file    Physically abused: Not on file    Forced  sexual activity: Not on file  Other Topics Concern  . Not on file  Social History Narrative   Lives with mom in a one story home.  No children.  Currently not working.  Education: 2 years of college.     Past Surgical History:  Procedure Laterality Date  . CHOLECYSTECTOMY  2011  . EYE SURGERY     Sty removal  . LABIAL ADHESION LYSIS  1999  . 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 TONSILLECTOMY  2012    Immunization History  Administered Date(s) Administered  . DT 05/30/2012  . Hepatitis A 05/10/2007, 12/27/2007  . Influenza,inj,Quad PF,6+ Mos 05/28/2014, 07/22/2015, 06/12/2016, 03/23/2018  . MMR 08/29/2012  . Meningococcal Conjugate 09/30/2011, 08/29/2012  . Pneumococcal Polysaccharide-23 09/30/2011, 08/29/2012  . Pneumococcal-Unspecified 01/28/2010    Current Meds  Medication Sig  . folic acid (FOLVITE) 1 MG tablet Take 1 tablet (1 mg  total) by mouth daily.  . hydroxyurea (HYDREA) 500 MG capsule TAKE 3 CAPSULES (1,500 MG TOTAL) BY MOUTH DAILY.  Mar[place] Kitchen ibuprofen (ADVIL,MOTRIN) 600 MG tablet Take 1 tablet (600 mg total) by mouth every 8 (eight) hours as needed.  . megestrol (MEGACE) 20 MG tablet Take 1 tablet (20 mg total) by mouth daily.  . ondansetron (ZOFRAN) 4 MG tablet Take 1 tablet (4 mg total) by mouth every 8 (eight) hours as needed for nausea or vomiting.  . Oxycodone HCl 10 MG TABS Take 1 tablet (10 mg total) by mouth every 6 (six) hours as needed.  . Vitamin D, Ergocalciferol, (DRISDOL) 50000 units CAPS capsule Take 1 capsule (50,000 Units total) by mouth every Saturday. Saturday.  . [DISCONTINUED] L-glutamine (ENDARI) 5 g PACK Powder Packet Take 5 g by mouth 2 (two) times daily.    Allergies  Allergen Reactions  . Latex Rash    BP 116/66 (BP Location: Right Arm, Patient Position: Sitting, Cuff Size: Small)   Pulse 90   Temp 97.9 F (36.6 C) (Oral)   Ht _0  (1.575 m)   Wt 127 lb (57.6 kg)   LMP 05/03/2018   SpO2 96%   BMI 23.23 kg/m   Review of Systems  Constitutional: Negative.   HENT: Negative.   Respiratory: Negative.   Cardiovascular: Negative.   Genitourinary: Negative.   Musculoskeletal: Positive for back pain (lower back).  Skin: Negative.   Neurological: Negative.   Psychiatric/Behavioral: Negative.    Objective:   Physical Exam  Constitutional: She is oriented to person, place, and time. She appears well-developed and well-nourished.  HENT:  Head: Normocephalic and atraumatic.  Right Ear: External ear normal.  Left Ear: External ear normal.  Nose: Nose normal.  Mouth/Throat: Oropharynx is clear and moist.  Eyes: Pupils are equal, round, and reactive to light. Conjunctivae and EOM are normal.  Neck: Normal range of motion. Neck supple.  Cardiovascular: Normal rate, regular rhythm, normal heart sounds and intact distal pulses.  Pulmonary/Chest: Effort normal and breath sounds normal.   Abdominal: Soft. Bowel sounds are normal.  Musculoskeletal: Normal range of motion.  Neurological: She is alert and oriented to person, place, and time.  Skin: Skin is warm and dry.  Psychiatric: She has a normal mood and affect. Her behavior is normal. Judgment and thought content normal.  Nursing note and vitals reviewed.  Assessment & Plan:   1. Hb-SS disease without crisis [facility]) She is doing well today. She will continue to take pain medications as prescribed; will continue to avoid  extreme heat and cold; will continue to eat a healthy diet and drink at least 64 ounces of water daily; continue stool softener as needed; will avoid colds and flu; will continue to get plenty of sleep and rest; will continue to avoid high stressful situations and remain infection free; will continue Folic Acid 1 mg daily to avoid sickle cell crisis. We will refill Endari today.  - POCT urinalysis dipstick - L-glutamine (ENDARI) 5 g PACK Powder Packet; Take 5 g by mouth 2 (two) times daily. (Patient not taking: Reported on 05/23/2018)  Dispense: 120 each; Refill: 5  2. Chronic prescription opiate use Continue pain medications as prescribed.   3. Chronic pain syndrome  4. Follow up She will follow up in months.   Meds ordered this encounter  Medications  . L-glutamine (ENDARI) 5 g PACK Powder Packet    Sig: Take 5 g by mouth 2 (two) times daily.    Dispense:  120 each    Refill:  [place],  MSN, FNP-C Patient [facility] [address] [place], [place] [address] [phone]

## 2018-05-25 ENCOUNTER — Telehealth: Payer: Self-pay

## 2018-05-27 ENCOUNTER — Other Ambulatory Visit: Payer: Self-pay | Admitting: Family Medicine

## 2018-05-27 DIAGNOSIS — Z79891 Long term (current) use of opiate analgesic: Secondary | ICD-10-CM

## 2018-05-27 DIAGNOSIS — D571 Sickle-cell disease without crisis: Secondary | ICD-10-CM

## 2018-05-27 MED ORDER — OXYCODONE HCL 10 MG PO TABS: 10.0000 mg | ORAL_TABLET | Freq: Four times a day (QID) | ORAL | 0 refills | Status: AC | PRN

## 2018-05-27 NOTE — Progress Notes (Signed)
Rx for Oxy-IR sent to pharmacy today.  

## 2018-06-07 ENCOUNTER — Telehealth: Payer: Self-pay

## 2018-06-08 NOTE — Telephone Encounter (Signed)
Patient is scheduled and aware. Thanks!

## 2018-06-09 ENCOUNTER — Ambulatory Visit (HOSPITAL_COMMUNITY)
Admission: RE | Admit: 2018-06-09 | Discharge: 2018-06-09 | Disposition: A | Discharge status: Home / Self Care | Payer: BLUE CROSS/BLUE SHIELD | Source: Ambulatory Visit | Attending: Family Medicine | Admitting: Family Medicine

## 2018-06-09 ENCOUNTER — Telehealth: Payer: Self-pay | Admitting: Family Medicine

## 2018-06-09 DIAGNOSIS — D649 Anemia, unspecified: Secondary | ICD-10-CM | POA: Diagnosis not present

## 2018-06-09 DIAGNOSIS — D571 Sickle-cell disease without crisis: Secondary | ICD-10-CM | POA: Insufficient documentation

## 2018-06-09 LAB — CBC WITH DIFFERENTIAL/PLATELET
Abs Immature Granulocytes: 0.04 10*3/uL (ref 0.00–0.07)
Basophils Absolute: 0.1 10*3/uL (ref 0.0–0.1)
Basophils Relative: 1 %
Eosinophils Absolute: 0 10*3/uL (ref 0.0–0.5)
Eosinophils Relative: 0 %
HCT: 18.9 % — ABNORMAL LOW (ref 36.0–46.0)
Hemoglobin: 6.8 g/dL — CL (ref 12.0–15.0)
Immature Granulocytes: 1 %
Lymphocytes Relative: 31 %
Lymphs Abs: 2.7 10*3/uL (ref 0.7–4.0)
MCH: 40.5 pg — ABNORMAL HIGH (ref 26.0–34.0)
MCHC: 36 g/dL (ref 30.0–36.0)
MCV: 112.5 fL — ABNORMAL HIGH (ref 80.0–100.0)
Monocytes Absolute: 1.7 10*3/uL — ABNORMAL HIGH (ref 0.1–1.0)
Monocytes Relative: 20 %
Neutro Abs: 4 10*3/uL (ref 1.7–7.7)
Neutrophils Relative %: 47 %
Platelets: 488 10*3/uL — ABNORMAL HIGH (ref 150–400)
RBC: 1.68 MIL/uL — ABNORMAL LOW (ref 3.87–5.11)
RDW: 18.2 % — ABNORMAL HIGH (ref 11.5–15.5)
WBC: 8.5 10*3/uL (ref 4.0–10.5)
nRBC: 1.3 % — ABNORMAL HIGH (ref 0.0–0.2)

## 2018-06-09 LAB — COMPREHENSIVE METABOLIC PANEL
ALT: 14 U/L (ref 0–44)
AST: 26 U/L (ref 15–41)
Albumin: 5.1 g/dL — ABNORMAL HIGH (ref 3.5–5.0)
Alkaline Phosphatase: 48 U/L (ref 38–126)
Anion gap: 10 (ref 5–15)
BUN: 7 mg/dL (ref 6–20)
CO2: 20 mmol/L — ABNORMAL LOW (ref 22–32)
Calcium: 9.4 mg/dL (ref 8.9–10.3)
Chloride: 106 mmol/L (ref 98–111)
Creatinine, Ser: 0.43 mg/dL — ABNORMAL LOW (ref 0.44–1.00)
GFR calc Af Amer: >60 mL/min (ref >60)
GFR calc non Af Amer: >60 mL/min (ref >60)
Glucose, Bld: 87 mg/dL (ref 70–99)
Potassium: 3.9 mmol/L (ref 3.5–5.1)
Sodium: 136 mmol/L (ref 135–145)
Total Bilirubin: 3.3 mg/dL — ABNORMAL HIGH (ref 0.3–1.2)
Total Protein: 7.5 g/dL (ref 6.5–8.1)

## 2018-06-09 LAB — TYPE AND SCREEN
ABO/RH(D): A POS
Antibody Screen: NEGATIVE

## 2018-06-09 MED ORDER — SODIUM CHLORIDE 0.9 % IV SOLN
INTRAVENOUS | Status: AC
  Administered 2018-06-09: 09:00:00 via INTRAVENOUS

## 2018-06-09 NOTE — Discharge Instructions (Signed)
Dehydration, Adult Dehydration is when there is not enough fluid or water in your body. This happens when you lose more fluids than you take in. Dehydration can range from mild to very bad. It should be treated right away to keep it from getting very bad. Symptoms of mild dehydration may include:  Thirst.  Dry lips.  Slightly dry mouth.  Dry, warm skin.  Dizziness. Symptoms of moderate dehydration may include:  Very dry mouth.  Muscle cramps.  Dark pee (urine). Pee may be the color of tea.  Your body making less pee.  Your eyes making fewer tears.  Heartbeat that is uneven or faster than normal (palpitations).  Headache.  Light-headedness, especially when you stand up from sitting.  Fainting (syncope). Symptoms of very bad dehydration may include:  Changes in skin, such as: ? Cold and clammy skin. ? Blotchy (mottled) or pale skin. ? Skin that does not quickly return to normal after being lightly pinched and let go (poor skin turgor).  Changes in body fluids, such as: ? Feeling very thirsty. ? Your eyes making fewer tears. ? Not sweating when body temperature is high, such as in hot weather. ? Your body making very little pee.  Changes in vital signs, such as: ? Weak pulse. ? Pulse that is more than 100 beats a minute when you are sitting still. ? Fast breathing. ? Low blood pressure.  Other changes, such as: ? Sunken eyes. ? Cold hands and feet. ? Confusion. ? Lack of energy (lethargy). ? Trouble waking up from sleep. ? Short-term weight loss. ? Unconsciousness. Follow these instructions at home:  If told by your doctor, drink an ORS: ? Make an ORS by using instructions on the package. ? Start by drinking small amounts, about  cup (120 mL) every 5-10 minutes. ? Slowly drink more until you have had the amount that your doctor said to have.  Drink enough clear fluid to keep your pee clear or pale yellow. If you were told to drink an ORS, finish the ORS  first, then start slowly drinking clear fluids. Drink fluids such as: ? Water. Do not drink only water by itself. Doing that can make the salt (sodium) level in your body get too low (hyponatremia). ? Ice chips. ? Fruit juice that you have added water to (diluted). ? Low-calorie sports drinks.  Avoid: ? Alcohol. ? Drinks that have a lot of sugar. These include high-calorie sports drinks, fruit juice that does not have water added, and soda. ? Caffeine. ? Foods that are greasy or have a lot of fat or sugar.  Take over-the-counter and prescription medicines only as told by your doctor.  Do not take salt tablets. Doing that can make the salt level in your body get too high (hypernatremia).  Eat foods that have minerals (electrolytes). Examples include bananas, oranges, potatoes, tomatoes, and spinach.  Keep all follow-up visits as told by your doctor. This is important. Contact a doctor if:  You have belly (abdominal) pain that: ? Gets worse. ? Stays in one area (localizes).  You have a rash.  You have a stiff neck.  You get angry or annoyed more easily than normal (irritability).  You are more sleepy than normal.  You have a harder time waking up than normal.  You feel: ? Weak. ? Dizzy. ? Very thirsty.  You have peed (urinated) only a small amount of very dark pee during 6-8 hours. Get help right away if:  You have symptoms of   very bad dehydration.  You cannot drink fluids without throwing up (vomiting).  Your symptoms get worse with treatment.  You have a fever.  You have a very bad headache.  You are throwing up or having watery poop (diarrhea) and it: ? Gets worse. ? Does not go away.  You have blood or something green (bile) in your throw-up.  You have blood in your poop (stool). This may cause poop to look black and tarry.  You have not peed in 6-8 hours.  You pass out (faint).  Your heart rate when you are sitting still is more than 100 beats a  minute.  You have trouble breathing. This information is not intended to replace advice given to you by your health care provider. Make sure you discuss any questions you have with your health care provider. Document Released: 05/16/2009 Document Revised: 02/07/2016 Document Reviewed: 09/13/2015 Elsevier Interactive Patient Education  2018 [employer]  

## 2018-06-09 NOTE — Progress Notes (Signed)
CRITICAL VALUE ALERT  Critical Value:  HB 6.8  Date & Time Notied:  06/09/2018, 09:50am  Provider Notified: Leanne Chang. NP  Orders Received/Actions taken: awaiting orders

## 2018-06-09 NOTE — Telephone Encounter (Signed)
Patient placed on schedule to receive IVFs in [facility] today. She states that she had a positive pregnancy test and was found to have low Hgb. We will draw CBC and CMET prior to her receiving IVFs today to re-evaluate.

## 2018-06-13 ENCOUNTER — Ambulatory Visit (HOSPITAL_COMMUNITY)
Admission: RE | Admit: 2018-06-13 | Discharge: 2018-06-13 | Disposition: A | Discharge status: Home / Self Care | Payer: BLUE CROSS/BLUE SHIELD | Source: Ambulatory Visit | Attending: Family Medicine | Admitting: Family Medicine

## 2018-06-13 DIAGNOSIS — D571 Sickle-cell disease without crisis: Secondary | ICD-10-CM | POA: Diagnosis not present

## 2018-06-13 LAB — COMPREHENSIVE METABOLIC PANEL
ALT: 12 U/L (ref 0–44)
AST: 28 U/L (ref 15–41)
Albumin: 4.7 g/dL (ref 3.5–5.0)
Alkaline Phosphatase: 47 U/L (ref 38–126)
Anion gap: 9 (ref 5–15)
BUN: 6 mg/dL (ref 6–20)
CO2: 19 mmol/L — ABNORMAL LOW (ref 22–32)
Calcium: 9.5 mg/dL (ref 8.9–10.3)
Chloride: 111 mmol/L (ref 98–111)
Creatinine, Ser: 0.41 mg/dL — ABNORMAL LOW (ref 0.44–1.00)
GFR calc Af Amer: >60 mL/min (ref >60)
GFR calc non Af Amer: >60 mL/min (ref >60)
Glucose, Bld: 136 mg/dL — ABNORMAL HIGH (ref 70–99)
Potassium: 3.4 mmol/L — ABNORMAL LOW (ref 3.5–5.1)
Sodium: 139 mmol/L (ref 135–145)
Total Bilirubin: 3.1 mg/dL — ABNORMAL HIGH (ref 0.3–1.2)
Total Protein: 7.1 g/dL (ref 6.5–8.1)

## 2018-06-13 LAB — TYPE AND SCREEN
ABO/RH(D): A POS
Antibody Screen: NEGATIVE

## 2018-06-13 LAB — CBC
HCT: 17.2 % — ABNORMAL LOW (ref 36.0–46.0)
Hemoglobin: 6.1 g/dL — CL (ref 12.0–15.0)
MCH: 40.7 pg — ABNORMAL HIGH (ref 26.0–34.0)
MCHC: 35.5 g/dL (ref 30.0–36.0)
MCV: 114.7 fL — ABNORMAL HIGH (ref 80.0–100.0)
Platelets: 444 10*3/uL — ABNORMAL HIGH (ref 150–400)
RBC: 1.5 MIL/uL — ABNORMAL LOW (ref 3.87–5.11)
RDW: 18.9 % — ABNORMAL HIGH (ref 11.5–15.5)
WBC: 9 10*3/uL (ref 4.0–10.5)
nRBC: 1.4 % — ABNORMAL HIGH (ref 0.0–0.2)

## 2018-06-13 NOTE — Discharge Instructions (Signed)
Labs draw. CBC, CMP and Type and screen.

## 2018-06-13 NOTE — Progress Notes (Signed)
CRITICAL VALUE ALERT  Critical Value:  Hemoglobin 6.1  Date & Time Notied:  06/13/18 at 09:25  Provider Notified: [name], FNP  Orders Received/Actions taken: No new orders

## 2018-06-13 NOTE — Progress Notes (Signed)
PATIENT CARE CENTER NOTE  Diagnosis: Sickle Cell Anemia    Provider: [name], NP   Procedure: Lab draw   Note: Patient's CBC, CMP and Type and Screen drawn. Patient tolerated procedure well. Vital signs stable. Patient asymptomatic. Discharge instructions given. Patient alert, oriented and ambulatory at discharge.

## 2018-06-16 ENCOUNTER — Other Ambulatory Visit: Payer: Self-pay | Admitting: Family Medicine

## 2018-06-16 ENCOUNTER — Emergency Department (HOSPITAL_COMMUNITY)
Admission: EM | Admit: 2018-06-16 | Discharge: 2018-06-17 | Disposition: A | Discharge status: Home / Self Care | Payer: BLUE CROSS/BLUE SHIELD | Attending: Emergency Medicine | Admitting: Emergency Medicine

## 2018-06-16 ENCOUNTER — Telehealth: Payer: Self-pay | Admitting: Family Medicine

## 2018-06-16 ENCOUNTER — Other Ambulatory Visit: Payer: Self-pay

## 2018-06-16 ENCOUNTER — Encounter (HOSPITAL_COMMUNITY): Payer: Self-pay | Admitting: *Deleted

## 2018-06-16 DIAGNOSIS — Z87891 Personal history of nicotine dependence: Secondary | ICD-10-CM | POA: Diagnosis not present

## 2018-06-16 DIAGNOSIS — Z79899 Other long term (current) drug therapy: Secondary | ICD-10-CM | POA: Diagnosis not present

## 2018-06-16 DIAGNOSIS — D571 Sickle-cell disease without crisis: Secondary | ICD-10-CM

## 2018-06-16 DIAGNOSIS — Z3A01 Less than 8 weeks gestation of pregnancy: Secondary | ICD-10-CM | POA: Insufficient documentation

## 2018-06-16 DIAGNOSIS — O99011 Anemia complicating pregnancy, first trimester: Secondary | ICD-10-CM | POA: Insufficient documentation

## 2018-06-16 DIAGNOSIS — M549 Dorsalgia, unspecified: Secondary | ICD-10-CM | POA: Diagnosis not present

## 2018-06-16 DIAGNOSIS — D5700 Hb-SS disease with crisis, unspecified: Secondary | ICD-10-CM

## 2018-06-16 DIAGNOSIS — Z3490 Encounter for supervision of normal pregnancy, unspecified, unspecified trimester: Secondary | ICD-10-CM

## 2018-06-16 DIAGNOSIS — D649 Anemia, unspecified: Secondary | ICD-10-CM

## 2018-06-16 DIAGNOSIS — O9989 Other specified diseases and conditions complicating pregnancy, childbirth and the puerperium: Secondary | ICD-10-CM | POA: Diagnosis present

## 2018-06-16 NOTE — ED Triage Notes (Signed)
Pt states lower back pain consistent with her sickle cell pain since yesterday. No meds PTA for the same. She says she is also [redacted] weeks pregnant. She reports she was at the [facility] clinic on Monday and they called her today to notify her that her HGB is low.

## 2018-06-16 NOTE — Progress Notes (Signed)
Order for Stat CBC placed today. Patient to report to Day Clinic on 06/17/2018 for lab draw in [facility].

## 2018-06-16 NOTE — Telephone Encounter (Signed)
Sandra Mckay, a 22 year old female with history of sickle cell anemia in first trimester of pregnancy currently has a hemoglobin of 6.1. Patient was notified of hemoglobin level this am by primary care provider.   Patient called the sickle cell day infusion center complaining of generalized, intense pain. The day hospital does not admit patients after 1 pm.   The patient was given clear instructions to report to ER at [facility] if symptoms do not improve, worsen or new problems develop. The patient verbalized understanding.    [name]  APRN, MSN, FNP-C [facility] [address] [place], [place] [address] [phone]

## 2018-06-17 ENCOUNTER — Encounter (HOSPITAL_COMMUNITY): Payer: BLUE CROSS/BLUE SHIELD

## 2018-06-17 LAB — COMPREHENSIVE METABOLIC PANEL
ALT: 14 U/L (ref 0–44)
AST: 28 U/L (ref 15–41)
Albumin: 4.6 g/dL (ref 3.5–5.0)
Alkaline Phosphatase: 50 U/L (ref 38–126)
Anion gap: 10 (ref 5–15)
BILIRUBIN TOTAL: 3.3 mg/dL — AB (ref 0.3–1.2)
BUN: 10 mg/dL (ref 6–20)
CO2: 20 mmol/L — ABNORMAL LOW (ref 22–32)
CREATININE: 0.41 mg/dL — AB (ref 0.44–1.00)
Calcium: 9.5 mg/dL (ref 8.9–10.3)
Chloride: 105 mmol/L (ref 98–111)
Glucose, Bld: 86 mg/dL (ref 70–99)
Potassium: 3.7 mmol/L (ref 3.5–5.1)
Sodium: 135 mmol/L (ref 135–145)
TOTAL PROTEIN: 6.9 g/dL (ref 6.5–8.1)

## 2018-06-17 LAB — CBC WITH DIFFERENTIAL/PLATELET
Abs Immature Granulocytes: 0.04 10*3/uL (ref 0.00–0.07)
BASOS PCT: 1 %
Basophils Absolute: 0.1 10*3/uL (ref 0.0–0.1)
EOS ABS: 0.1 10*3/uL (ref 0.0–0.5)
EOS PCT: 0 %
HEMATOCRIT: 19.3 % — AB (ref 36.0–46.0)
HEMOGLOBIN: 6.6 g/dL — AB (ref 12.0–15.0)
Immature Granulocytes: 0 %
Lymphocytes Relative: 34 %
Lymphs Abs: 4.2 10*3/uL — ABNORMAL HIGH (ref 0.7–4.0)
MCH: 39.8 pg — AB (ref 26.0–34.0)
MCHC: 34.2 g/dL (ref 30.0–36.0)
MCV: 116.3 fL — AB (ref 80.0–100.0)
Monocytes Absolute: 2.2 10*3/uL — ABNORMAL HIGH (ref 0.1–1.0)
Monocytes Relative: 18 %
NRBC: 1.1 % — AB (ref 0.0–0.2)
Neutro Abs: 5.9 10*3/uL (ref 1.7–7.7)
Neutrophils Relative %: 47 %
Platelets: 481 10*3/uL — ABNORMAL HIGH (ref 150–400)
RBC: 1.66 MIL/uL — AB (ref 3.87–5.11)
RDW: 19.7 % — ABNORMAL HIGH (ref 11.5–15.5)
WBC: 12.5 10*3/uL — ABNORMAL HIGH (ref 4.0–10.5)

## 2018-06-17 LAB — RETICULOCYTES
IMMATURE RETIC FRACT: 40.7 % — AB (ref 2.3–15.9)
RBC.: 1.66 MIL/uL — ABNORMAL LOW (ref 3.87–5.11)
Retic Count, Absolute: 450.5 10*3/uL — ABNORMAL HIGH (ref 19.0–186.0)
Retic Ct Pct: 27.1 % — ABNORMAL HIGH (ref 0.4–3.1)

## 2018-06-17 LAB — HCG, QUANTITATIVE, PREGNANCY: HCG, BETA CHAIN, QUANT, S: 22601 m[IU]/mL — AB (ref <5)

## 2018-06-17 MED ORDER — HYDROMORPHONE HCL 1 MG/ML IJ SOLN: 1.0000 mg | INTRAMUSCULAR | Status: DC

## 2018-06-17 MED ORDER — HYDROMORPHONE HCL 1 MG/ML IJ SOLN
0.5000 mg | INTRAMUSCULAR | Status: AC
  Administered 2018-06-17: 0.5 mg via INTRAVENOUS
  Filled 2018-06-17: qty 1

## 2018-06-17 MED ORDER — HYDROMORPHONE HCL 1 MG/ML IJ SOLN
0.5000 mg | Freq: Once | INTRAMUSCULAR | Status: AC | PRN
  Administered 2018-06-17: 0.5 mg via INTRAVENOUS
  Filled 2018-06-17 (×2): qty 1

## 2018-06-17 MED ORDER — ONDANSETRON HCL 4 MG/2ML IJ SOLN
4.0000 mg | Freq: Once | INTRAMUSCULAR | Status: AC
  Administered 2018-06-17: 4 mg via INTRAVENOUS
  Filled 2018-06-17: qty 2

## 2018-06-17 MED ORDER — HYDROMORPHONE HCL 1 MG/ML IJ SOLN: 0.5000 mg | INTRAMUSCULAR | Status: AC

## 2018-06-17 MED ORDER — HYDROMORPHONE HCL 1 MG/ML IJ SOLN
0.5000 mg | Freq: Once | INTRAMUSCULAR | Status: AC
  Administered 2018-06-17: 0.5 mg via INTRAVENOUS
  Filled 2018-06-17: qty 1

## 2018-06-17 NOTE — ED Provider Notes (Signed)
[facility] DEPT Provider Note   CSN: [id number] Arrival date & time: 06/16/18  2306     History   Chief Complaint Chief Complaint  Patient presents with  . Sickle Cell Pain Crisis    HPI Sandra Mckay is a 22 y.o. female.  The history is provided by the patient and medical records. No language interpreter was used.  Sickle Cell Pain Crisis  Associated symptoms: no nausea and no vomiting    Sandra Mckay is a 22 y.o. female  with a PMH of sickle cell anemia who presents to the Emergency Department complaining of low back pain c/w her typical sickle cell crisis. Pain began yesterday and has been constant. Patient states that she went to the sickle cell day clinic and had labs drawn earlier this week. She was called today because her hgb was 6.1. She was told to go to the day clinic first thing in the morning for blood transfusion, but her pain increased and she didn't feel as if she could wait until morning for pain control. She has not tried any of her home medications. She recently found out that she was pregnant (7 weeks). She was told that she could no longer take her hydroxyurea, but was unsure if the remainder of her home medications were safe in pregnancy, therefore thought it best to come to ER for pain control. She is not having any abdominal pain, urinary symptoms, vaginal discharge or bleeding. No nausea/vomiting. No fevers. No weakness, numbness, tingling.    Past Medical History:  Diagnosis Date  . Blood transfusion without reported diagnosis   . Bronchitis   . Chickenpox   . Depression   . Heart murmur   . Sickle cell anemia (HCC)   . Urinary tract infection     Patient Active Problem List   Diagnosis Date Noted  . Bipolar and related disorder ([place]) 04/04/2017  . Sickle cell pain crisis ([place]) 12/05/2016  . Sickle cell anemia with crisis ([place]) 12/05/2016  . Vasovagal syncope 09/18/2016  . Closed fracture of lumbar vertebra  without spinal cord injury ([place]) 07/15/2016  . Nausea without vomiting 09/09/2015  . Vitamin D deficiency 07/22/2015  . Sickle cell anemia ([place]) 06/08/2015    Past Surgical History:  Procedure Laterality Date  . CHOLECYSTECTOMY  2011  . EYE SURGERY     Sty removal  . LABIAL ADHESION LYSIS  1999  . 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 TONSILLECTOMY  2012     OB History    Gravida  1   Para      Term      Preterm      AB      Living        SAB      TAB      Ectopic      Multiple      Live Births               Home Medications    Prior to Admission medications   Medication Sig Start Date End Date Taking? Authorizing Provider  folic acid (FOLVITE) 1 MG tablet Take 1 tablet (1 mg total) by mouth daily. 03/23/18  Yes Azzie Glatter, FNP  megestrol (MEGACE) 20 MG tablet Take 1 tablet (20 mg total) by mouth daily. 03/23/18  Yes Azzie Glatter, FNP  ondansetron (ZOFRAN) 4 MG tablet Take 1 tablet (4 mg total) by mouth every 8 (  eight) hours as needed for nausea or vomiting. 03/23/18  Yes Azzie Glatter, FNP  oxyCODONE (OXY IR/ROXICODONE) 5 MG immediate release tablet Take 10 mg by mouth every 4 (four) hours as needed for severe pain.   Yes [provider]  Vitamin D, Ergocalciferol, (DRISDOL) 50000 units CAPS capsule Take 1 capsule (50,000 Units total) by mouth every Saturday. Saturday. 03/26/18  Yes Azzie Glatter, FNP  hydroxyurea (HYDREA) 500 MG capsule TAKE 3 CAPSULES (1,500 MG TOTAL) BY MOUTH DAILY. Patient not taking: Reported on 06/16/2018 03/23/18   Azzie Glatter, FNP  ibuprofen (ADVIL,MOTRIN) 600 MG tablet Take 1 tablet (600 mg total) by mouth every 8 (eight) hours as needed. Patient not taking: Reported on 06/16/2018 03/23/18   Azzie Glatter, FNP  L-glutamine (ENDARI) 5 g PACK Powder Packet Take 5 g by mouth 2 (two) times daily. Patient not taking: Reported on 05/23/2018 05/23/18   Azzie Glatter,  FNP    Family History Family History  Problem Relation Age of Onset  . Arthritis Other        grandparent  . Stroke Other   . Hypertension Other   . Diabetes Unknown        grandparent  . Cancer - Other Unknown        Glioblastoma    Social History Social History   Tobacco Use  . Smoking status: Former Research scientist (life sciences)  . Smokeless tobacco: Never Used  Substance Use Topics  . Alcohol use: Yes    Alcohol/week: 0.0 standard drinks    Comment: occ  . Drug use: No     Allergies   Latex   Review of Systems Review of Systems  Gastrointestinal: Negative for abdominal pain, nausea and vomiting.  Genitourinary: Negative for dysuria, frequency, pelvic pain, urgency, vaginal bleeding, vaginal discharge and vaginal pain.  Musculoskeletal: Positive for arthralgias, back pain and myalgias.  Neurological: Negative for syncope, weakness and numbness.  All other systems reviewed and are negative.    Physical Exam Updated Vital Signs BP 114/65   Pulse 99   Temp 99.5 F (37.5 C) (Oral)   Resp 17   Ht 5\' 3"  (1.6 m)   Wt 56.7 kg   LMP 05/02/2018   SpO2 97%   BMI 22.14 kg/m   Physical Exam  Constitutional: She is oriented to person, place, and time. She appears well-developed and well-nourished. No distress.  HENT:  Head: Normocephalic and atraumatic.  Cardiovascular: Normal rate, regular rhythm and normal heart sounds.  No murmur heard. Pulmonary/Chest: Effort normal and breath sounds normal. No respiratory distress.  Abdominal: Soft. She exhibits no distension.  No abdominal tenderness.  Musculoskeletal: Normal range of motion.  Diffuse tenderness to low back.   Neurological: She is alert and oriented to person, place, and time.  Bilateral lower extremities neurovascularly intact.   Skin: Skin is warm and dry.  Nursing note and vitals reviewed.    ED Treatments / Results  Labs (all labs ordered are listed, but only abnormal results are displayed) Labs Reviewed    COMPREHENSIVE METABOLIC PANEL - Abnormal; Notable for the following components:      Result Value   CO2 20 (*)    Creatinine, Ser 0.41 (*)    Total Bilirubin 3.3 (*)    All other components within normal limits  CBC WITH DIFFERENTIAL/PLATELET - Abnormal; Notable for the following components:   WBC 12.5 (*)    RBC 1.66 (*)    Hemoglobin 6.6 (*)    HCT  19.3 (*)    MCV 116.3 (*)    MCH 39.8 (*)    RDW 19.7 (*)    Platelets 481 (*)    nRBC 1.1 (*)    Lymphs Abs 4.2 (*)    Monocytes Absolute 2.2 (*)    All other components within normal limits  RETICULOCYTES - Abnormal; Notable for the following components:   Retic Ct Pct 27.1 (*)    RBC. 1.66 (*)    Retic Count, Absolute 450.5 (*)    Immature Retic Fract 40.7 (*)    All other components within normal limits  HCG, QUANTITATIVE, PREGNANCY - Abnormal; Notable for the following components:   hCG, Beta Chain, Quant, S 22,601 (*)    All other components within normal limits  I-STAT BETA HCG BLOOD, ED ([facility], [facility], [facility] ONLY)    EKG None  Radiology No results found.  Procedures Procedures (including critical care time)  Medications Ordered in ED Medications  HYDROmorphone (DILAUDID) injection 0.5 mg (0.5 mg Intravenous Given 06/17/18 0043)    Or  HYDROmorphone (DILAUDID) injection 0.5 mg ( Subcutaneous See Alternative 06/17/18 0043)  HYDROmorphone (DILAUDID) injection 0.5 mg (0.5 mg Intravenous Given 06/17/18 0254)  ondansetron (ZOFRAN) injection 4 mg (4 mg Intravenous Given 06/17/18 0254)  HYDROmorphone (DILAUDID) injection 0.5 mg (0.5 mg Intravenous Given 06/17/18 0359)     Initial Impression / Assessment and Plan / ED Course  I have reviewed the triage vital signs and the nursing notes.  Pertinent labs & imaging results that were available during my care of the patient were reviewed by me and considered in my medical decision making (see chart for details).    Kelee Cunningham is a 22 y.o. female who presents to ED for  pain c/w typical sickle cell crisis. No shortness of breath, fevers or signs of acute chest. Patient has recently found out that she was pregnant and her doctor recommended that she quit taking her hydroxyurea due to pregnancy status. She is not having any abdominal pain, vaginal bleeding/discharge, urinary symptoms or pelvic pain. Benign abdominal exam. Patient otherwise in no acute distress objectively other than complaint of pain. Given IV fluids and pain medication.   Labs baseline. Per patient, hgb is a little lower than they would like. She was actually told to go to the day center in the morning for possible transfusion. Patient re-evaluated and feels much improved. Will discharge her from the ED tonight with recommendations to continue with follow up plan with her previous provider. She agrees to go to the day clinic when they open. Reasons to return to ER discussed and all questions answered.    Patient discussed with Dr. Florina Ou who agrees with treatment plan.    Final Clinical Impressions(s) / ED Diagnoses   Final diagnoses:  Sickle cell pain crisis [facility])  Pregnancy, unspecified gestational age    ED Discharge Orders    None       Shayley Medlin, Ozella Almond, PA-C 06/17/18 0410    Shanon Rosser, MD 06/17/18 [phone]

## 2018-06-17 NOTE — Discharge Instructions (Addendum)
Go to the sickle cell day clinic when they open.  Return to ER for new or worsening symptoms, any additional concerns.

## 2018-06-17 NOTE — ED Notes (Signed)
Date and time results received: 06/17/18 0127 (use smartphrase ".now" to insert current time)  Test: hgb Critical Value: 6.6  Name of Provider Notified: [place]  Orders Received? Or Actions Taken?: waiting on orders

## 2018-06-24 ENCOUNTER — Telehealth: Payer: Self-pay

## 2018-06-27 ENCOUNTER — Other Ambulatory Visit: Payer: Self-pay | Admitting: Family Medicine

## 2018-06-27 ENCOUNTER — Telehealth: Payer: Self-pay | Admitting: Family Medicine

## 2018-06-27 DIAGNOSIS — Z79891 Long term (current) use of opiate analgesic: Secondary | ICD-10-CM

## 2018-06-27 DIAGNOSIS — G894 Chronic pain syndrome: Secondary | ICD-10-CM

## 2018-06-27 DIAGNOSIS — D571 Sickle-cell disease without crisis: Secondary | ICD-10-CM

## 2018-06-27 MED ORDER — OXYCODONE HCL 10 MG PO TABS: 10.0000 mg | ORAL_TABLET | Freq: Four times a day (QID) | ORAL | 0 refills | Status: DC | PRN

## 2018-06-27 MED ORDER — OXYCODONE HCL 5 MG PO TABS: 10.0000 mg | ORAL_TABLET | ORAL | 0 refills | Status: DC | PRN

## 2018-06-27 NOTE — Progress Notes (Signed)
Rx for Oxy-IR 10 mg sent to pharmacy today.

## 2018-06-27 NOTE — Progress Notes (Signed)
Rx for Oxy-IR sent to pharmacy today.  

## 2018-06-27 NOTE — Telephone Encounter (Signed)
-----   Message from Azzie Glatter, [place] sent at 06/27/2018 10:59 AM EST ----- Regarding: "Refill" Rx for Oxy-IR sent to pharmacy today. Please contact patient. Thank you.

## 2018-06-27 NOTE — Telephone Encounter (Signed)
Contacted patient to assess about recent news of pregnancy. She is currently [redacted] weeks gestation and is doing well. She has mild pain in her legs and back. She had her first OB-GYN visit a week ago for Ultrasound, with follow up appointment on 07/12/2018. She has discontinued Hydrea, Folic Acid, and Vitamin D. She will begin Prenatal Vitamins once she picks up Rx.  She will continue to take pain medications as prescribed; will continue to avoid extreme heat and cold; will continue to eat a healthy diet and drink at least 64 ounces of water daily; continue stool softener as needed; will avoid colds and flu; will continue to get plenty of sleep and rest; will continue to avoid high stressful situations and remain infection free; will continue to avoid sickle cell crisis.

## 2018-06-27 NOTE — Telephone Encounter (Signed)
Patient notified

## 2018-07-15 DIAGNOSIS — D571 Sickle-cell disease without crisis: Secondary | ICD-10-CM | POA: Insufficient documentation

## 2018-07-15 DIAGNOSIS — F419 Anxiety disorder, unspecified: Secondary | ICD-10-CM | POA: Insufficient documentation

## 2018-07-15 DIAGNOSIS — O99411 Diseases of the circulatory system complicating pregnancy, first trimester: Secondary | ICD-10-CM | POA: Insufficient documentation

## 2018-07-15 DIAGNOSIS — O0993 Supervision of high risk pregnancy, unspecified, third trimester: Secondary | ICD-10-CM | POA: Insufficient documentation

## 2018-07-15 DIAGNOSIS — F32A Depression, unspecified: Secondary | ICD-10-CM | POA: Insufficient documentation

## 2018-07-22 ENCOUNTER — Telehealth: Payer: Self-pay

## 2018-07-22 ENCOUNTER — Other Ambulatory Visit: Payer: Self-pay | Admitting: Family Medicine

## 2018-07-22 DIAGNOSIS — Z79891 Long term (current) use of opiate analgesic: Secondary | ICD-10-CM

## 2018-07-22 DIAGNOSIS — D571 Sickle-cell disease without crisis: Secondary | ICD-10-CM

## 2018-07-22 DIAGNOSIS — G894 Chronic pain syndrome: Secondary | ICD-10-CM

## 2018-07-22 MED ORDER — OXYCODONE HCL 10 MG PO TABS: 10.0000 mg | ORAL_TABLET | Freq: Four times a day (QID) | ORAL | 0 refills | Status: DC | PRN

## 2018-07-22 NOTE — Telephone Encounter (Signed)
cay 

## 2018-07-22 NOTE — Telephone Encounter (Signed)
Patient was given oxycodone 5 mg from [facility] for the week and states that she is currently out of medication until the 27th and wants to know can she fill these are will this void her contract with [place]. Patient states that since she has been pregnant she has been taking more medication because of pain.

## 2018-08-23 ENCOUNTER — Ambulatory Visit (INDEPENDENT_AMBULATORY_CARE_PROVIDER_SITE_OTHER): Payer: Self-pay | Admitting: Family Medicine

## 2018-08-23 ENCOUNTER — Encounter: Payer: Self-pay | Admitting: Family Medicine

## 2018-08-23 VITALS — BP 114/68 | HR 88 | Temp 98.5°F | Ht 63.0 in | Wt 134.6 lb

## 2018-08-23 DIAGNOSIS — F32A Depression, unspecified: Secondary | ICD-10-CM

## 2018-08-23 DIAGNOSIS — Z79891 Long term (current) use of opiate analgesic: Secondary | ICD-10-CM

## 2018-08-23 DIAGNOSIS — Z09 Encounter for follow-up examination after completed treatment for conditions other than malignant neoplasm: Secondary | ICD-10-CM

## 2018-08-23 DIAGNOSIS — I272 Pulmonary hypertension, unspecified: Secondary | ICD-10-CM

## 2018-08-23 DIAGNOSIS — G894 Chronic pain syndrome: Secondary | ICD-10-CM

## 2018-08-23 DIAGNOSIS — D571 Sickle-cell disease without crisis: Secondary | ICD-10-CM

## 2018-08-23 DIAGNOSIS — D649 Anemia, unspecified: Secondary | ICD-10-CM

## 2018-08-23 DIAGNOSIS — F329 Major depressive disorder, single episode, unspecified: Secondary | ICD-10-CM

## 2018-08-23 LAB — POCT URINALYSIS DIP (MANUAL ENTRY)
Bilirubin, UA: NEGATIVE
Blood, UA: NEGATIVE
Glucose, UA: NEGATIVE mg/dL
Ketones, POC UA: NEGATIVE mg/dL
Leukocytes, UA: NEGATIVE
Nitrite, UA: NEGATIVE
Protein Ur, POC: NEGATIVE mg/dL
Spec Grav, UA: 1.015 (ref 1.010–1.025)
Urobilinogen, UA: 0.2 E.U./dL
pH, UA: 7 (ref 5.0–8.0)

## 2018-08-23 MED ORDER — OXYCODONE HCL 10 MG PO TABS: 10.0000 mg | ORAL_TABLET | Freq: Four times a day (QID) | ORAL | 0 refills | Status: DC | PRN

## 2018-08-23 NOTE — Progress Notes (Signed)
Sickle Cell Anemia Established Patient Office Visit  Subjective:  Patient ID: Calleen Alvis, female    DOB: Jun 08, 1996  Age: 23 y.o. MRN: [id number]  CC:  Chief Complaint  Patient presents with  . Follow-up    sickle cell    HPI Zanasia Hickson is a 23 year old female who presents for follow up of Sickle Cell Anemia.   Past Medical History:  Diagnosis Date  . Blood transfusion without reported diagnosis   . Bronchitis   . Chickenpox   . Depression   . Heart murmur   . Pulmonary hypertension ([place])   . Sickle cell anemia (HCC)   . Urinary tract infection     Past Surgical History:  Procedure Laterality Date  . CHOLECYSTECTOMY  2011  . EYE SURGERY     Sty removal  . LABIAL ADHESION LYSIS  1999  . 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 TONSILLECTOMY  2012   Current Status: Since her last office visit, she is doing well with no complaints. She states that she has pain in her back, hips, and legs. She is [redacted] weeks pregnant and has had dizziness, lightheadedness. She has recently been having increasing pain in her right hip and right leg. She rates her pain today at 6/10. She has not has a hospital visit for Sickle Cell Crisis since 07/24/2018, where she was at [facility] for pain management, where she was treated and discharged the same day. She would like to come to [facility] for future Sickle Cell Crisis. She is currently taking all medications as prescribed and staying well hydrated. She reports occasional cough and shortness of breath. Her anxiety is increased today. She is concerned of residual side effects on her baby, from taking opioids. She denies suicidal ideations, homicidal ideations, or auditory hallucinations. She is currently taking Zoloft for Depression. Her appetite has increased. She has been diagnosed with Pulmonary Hypertension, where she recently received Echo Cardiogram on 08/05/2018 which  revealed:  "TRICUSPID VALVE There is mild tricuspid valve thickening. There is trace tricuspid regurgitation. There was insufficient TR detected to calculate RV systolic  Pressure."  She has follow up appointment with Cardiology on 09/07/2018. She also has appointment with fetal medicine on 09/09/2018. She reports occasional cough. She denies visual changes, chest pain, heart palpitations, and falls. She has occasionally headaches and dizziness with position changes. Denies severe headaches, confusion, seizures, double vision, and blurred vision, nausea and vomiting.   She denies fevers, chills, fatigue, recent infections, weight loss, and night sweats. She has not had any visual changes, and falls. No chest pain, heart palpitations reported. She has occasional constipation. No reports of GI problems such as nausea, vomiting, and diarrhea. She has no reports of blood in stools, dysuria and hematuria.    Family History  Problem Relation Age of Onset  . Arthritis Other        grandparent  . Stroke Other   . Hypertension Other   . Diabetes Unknown        grandparent  . Cancer - Other Unknown        Glioblastoma    Social History   Socioeconomic History  . Marital status: Single    Spouse name: Not on file  . Number of children: Not on file  . Years of education: Not on file  . Highest education level: Not on file  Occupational History  . Not on file  Social  Needs  . Financial resource strain: Not on file  . Food insecurity:    Worry: Not on file    Inability: Not on file  . Transportation needs:    Medical: Not on file    Non-medical: Not on file  Tobacco Use  . Smoking status: Former Research scientist (life sciences)  . Smokeless tobacco: Never Used  Substance and Sexual Activity  . Alcohol use: Yes    Alcohol/week: 0.0 standard drinks    Comment: occ  . Drug use: No  . Sexual activity: Yes    Birth control/protection: Implant  Lifestyle  . Physical activity:    Days per week: Not on file     Minutes per session: Not on file  . Stress: Not on file  Relationships  . Social connections:    Talks on phone: Not on file    Gets together: Not on file    Attends religious service: Not on file    Active member of club or organization: Not on file    Attends meetings of clubs or organizations: Not on file    Relationship status: Not on file  . Intimate partner violence:    Fear of current or ex partner: Not on file    Emotionally abused: Not on file    Physically abused: Not on file    Forced sexual activity: Not on file  Other Topics Concern  . Not on file  Social History Narrative   Lives with mom in a one story home.  No children.  Currently not working.  Education: 2 years of college.     Outpatient Medications Prior to Visit  Medication Sig Dispense Refill  . doxylamine, Sleep, (UNISOM) 25 MG tablet Take 25 mg by mouth at bedtime as needed.    . Prenatal Vit-Fe Fumarate-FA (MULTIVITAMIN-PRENATAL) 27-0.8 MG TABS tablet Take 1 tablet by mouth daily at 12 noon.    . sertraline (ZOLOFT) 25 MG tablet Take 25 mg by mouth daily.    . folic acid (FOLVITE) 1 MG tablet Take 1 tablet (1 mg total) by mouth daily. (Patient not taking: Reported on 08/23/2018) 90 tablet 3  . hydroxyurea (HYDREA) 500 MG capsule TAKE 3 CAPSULES (1,500 MG TOTAL) BY MOUTH DAILY. (Patient not taking: Reported on 06/16/2018) 90 capsule 3  . ibuprofen (ADVIL,MOTRIN) 600 MG tablet Take 1 tablet (600 mg total) by mouth every 8 (eight) hours as needed. (Patient not taking: Reported on 06/16/2018) 30 tablet 3  . L-glutamine (ENDARI) 5 g PACK Powder Packet Take 5 g by mouth 2 (two) times daily. (Patient not taking: Reported on 05/23/2018) 120 each 5  . megestrol (MEGACE) 20 MG tablet Take 1 tablet (20 mg total) by mouth daily. (Patient not taking: Reported on 08/23/2018) 30 tablet 1  . ondansetron (ZOFRAN) 4 MG tablet Take 1 tablet (4 mg total) by mouth every 8 (eight) hours as needed for nausea or vomiting. (Patient not  taking: Reported on 08/23/2018) 20 tablet 5  . Vitamin D, Ergocalciferol, (DRISDOL) 50000 units CAPS capsule Take 1 capsule (50,000 Units total) by mouth every Saturday. Saturday. (Patient not taking: Reported on 08/23/2018) 30 capsule 5   No facility-administered medications prior to visit.     Allergies  Allergen Reactions  . Latex Rash  . Other Rash    ROS Review of Systems  Constitutional: Negative.   HENT: Negative.   Eyes: Negative.   Respiratory: Negative.   Cardiovascular: Negative.   Gastrointestinal: Positive for constipation (Occasional).  Endocrine: Negative.  Genitourinary: Negative.   Musculoskeletal: Negative.   Allergic/Immunologic: Negative.   Neurological: Positive for dizziness and headaches.  Hematological: Negative.   Psychiatric/Behavioral: The patient is nervous/anxious (r/t increased pain and pregnancy).    Objective:    Physical Exam  BP 114/68 (BP Location: Left Arm, Patient Position: Sitting, Cuff Size: Small)   Pulse 88   Temp 98.5 F (36.9 C) (Oral)   Ht 5\' 3"  (1.6 m)   Wt 134 lb 9.6 oz (61.1 kg)   LMP 05/03/2018   SpO2 100%   BMI 23.84 kg/m  Wt Readings from Last 3 Encounters:  08/23/18 134 lb 9.6 oz (61.1 kg)  06/16/18 125 lb (56.7 kg)  05/23/18 127 lb (57.6 kg)     Health Maintenance Due  Topic Date Due  . PAP-Cervical Cytology Screening  09/01/2014  . PAP SMEAR-Modifier  09/01/2017    There are no preventive care reminders to display for this patient.  No results found for: TSH Lab Results  Component Value Date   WBC 12.5 (H) 06/16/2018   HGB 6.6 (LL) 06/16/2018   HCT 19.3 (L) 06/16/2018   MCV 116.3 (H) 06/16/2018   PLT 481 (H) 06/16/2018   Lab Results  Component Value Date   NA 135 06/16/2018   K 3.7 06/16/2018   CO2 20 (L) 06/16/2018   GLUCOSE 86 06/16/2018   BUN 10 06/16/2018   CREATININE 0.41 (L) 06/16/2018   BILITOT 3.3 (H) 06/16/2018   ALKPHOS 50 06/16/2018   AST 28 06/16/2018   ALT 14 06/16/2018    PROT 6.9 06/16/2018   ALBUMIN 4.6 06/16/2018   CALCIUM 9.5 06/16/2018   ANIONGAP 10 06/16/2018   No results found for: CHOL No results found for: HDL No results found for: LDLCALC No results found for: TRIG No results found for: CHOLHDL No results found for: HGBA1C    Assessment & Plan:   1. Hb-SS disease without crisis [facility]) She is doing fairly well today. She has c/o of increased anxiety and pain r/t to pregnancy. She will continue to take pain medications as prescribed; will continue to avoid extreme heat and cold; will continue to eat a healthy diet and drink at least 64 ounces of water daily; continue stool softener as needed; will avoid colds and flu; will continue to get plenty of sleep and rest; will continue to avoid high stressful situations and remain infection free; will continue Folic Acid 1 mg daily to avoid sickle cell crisis. If she experiences crisis, she will report to the [facility] up to 21 weeks of her pregnancy and afterwards, to the Maternal Admissions Unit at [facility] hospital where pain management will be monitored by [place] Hollis, NP.  - CBC with Differential - Hemoglobinopathy evaluation - Comprehensive metabolic panel; Future - Comprehensive metabolic panel - Oxycodone 10 mg tablets  2. Chronic prescription opiate use - Oxycodone 10 mg tablets  3. Chronic pain syndrome - Oxycodone 10 mg tablets  4. Anemia, unspecified type Hgb at decreased at 6.6 today. We will draw CBC today. Results are pending. Monitor.   5. Pulmonary hypertension ([place]) She has follow up with Cardiology for further evaluation on 09/07/2018.  6. Depression, unspecified depression type Moderate, situational anxiety. She will continue Zoloft as prescribed.   7. Follow up She will follow up in 2 weeks. She will call to [facility] needed for increased pain.  - POCT urinalysis dipstick  Problem List Items Addressed This Visit  Other    Sickle cell anemia (HCC) - Primary   Relevant Orders   CBC with Differential   Hemoglobinopathy evaluation   Comprehensive metabolic panel    Other Visit Diagnoses    Chronic prescription opiate use       Chronic pain syndrome       Anemia, unspecified type       Pulmonary hypertension (HCC)       Depression, unspecified depression type       Relevant Medications   sertraline (ZOLOFT) 25 MG tablet   Follow up       Relevant Orders   POCT urinalysis dipstick (Completed)      No orders of the defined types were placed in this encounter.   Follow-up: No follow-ups on file.    Azzie Glatter, FNP

## 2018-08-24 ENCOUNTER — Telehealth: Payer: Self-pay

## 2018-08-24 NOTE — Telephone Encounter (Signed)
Patient notified

## 2018-08-24 NOTE — Telephone Encounter (Signed)
-----   Message from Azzie Glatter, [place] sent at 08/23/2018  1:27 PM EST ----- Regarding: "Refill" Rx for Oxy-IR sent to pharmacy today. Please inform patient.

## 2018-08-25 LAB — CBC WITH DIFFERENTIAL/PLATELET
Basophils Absolute: 0.1 10*3/uL (ref 0.0–0.2)
Basos: 1 %
EOS (ABSOLUTE): 0 10*3/uL (ref 0.0–0.4)
Eos: 0 %
Hematocrit: 22.6 % — ABNORMAL LOW (ref 34.0–46.6)
Hemoglobin: 7.7 g/dL — ABNORMAL LOW (ref 11.1–15.9)
Immature Grans (Abs): 0.1 10*3/uL (ref 0.0–0.1)
Immature Granulocytes: 1 %
Lymphocytes Absolute: 2.8 10*3/uL (ref 0.7–3.1)
Lymphs: 26 %
MCH: 32.5 pg (ref 26.6–33.0)
MCHC: 34.1 g/dL (ref 31.5–35.7)
MCV: 95 fL (ref 79–97)
Monocytes Absolute: 1.6 10*3/uL — ABNORMAL HIGH (ref 0.1–0.9)
Monocytes: 15 %
NRBC: 1 % — ABNORMAL HIGH (ref 0–0)
Neutrophils Absolute: 6.4 10*3/uL (ref 1.4–7.0)
Neutrophils: 57 %
Platelets: 443 10*3/uL (ref 150–450)
RBC: 2.37 x10E6/uL — CL (ref 3.77–5.28)
RDW: 21 % — ABNORMAL HIGH (ref 11.7–15.4)
WBC: 11 10*3/uL — ABNORMAL HIGH (ref 3.4–10.8)

## 2018-08-25 LAB — COMPREHENSIVE METABOLIC PANEL
ALT: 17 IU/L (ref 0–32)
AST: 28 IU/L (ref 0–40)
Albumin/Globulin Ratio: 2 (ref 1.2–2.2)
Albumin: 4.3 g/dL (ref 3.9–5.0)
Alkaline Phosphatase: 61 IU/L (ref 39–117)
BUN/Creatinine Ratio: 16 (ref 9–23)
BUN: 6 mg/dL (ref 6–20)
Bilirubin Total: 1.1 mg/dL (ref 0.0–1.2)
CO2: 19 mmol/L — ABNORMAL LOW (ref 20–29)
Calcium: 9.5 mg/dL (ref 8.7–10.2)
Chloride: 104 mmol/L (ref 96–106)
Creatinine, Ser: 0.37 mg/dL — ABNORMAL LOW (ref 0.57–1.00)
GFR calc Af Amer: 173 mL/min/{1.73_m2} (ref >59)
GFR calc non Af Amer: 150 mL/min/{1.73_m2} (ref >59)
Globulin, Total: 2.2 g/dL (ref 1.5–4.5)
Glucose: 62 mg/dL — ABNORMAL LOW (ref 65–99)
Potassium: 4.1 mmol/L (ref 3.5–5.2)
Sodium: 137 mmol/L (ref 134–144)
Total Protein: 6.5 g/dL (ref 6.0–8.5)

## 2018-08-25 LAB — HEMOGLOBINOPATHY EVALUATION
HGB C: 0 %
HGB S: 33.4 % — ABNORMAL HIGH
HGB VARIANT: 0 %
Hemoglobin A2 Quantitation: 2.9 % (ref 1.8–3.2)
Hemoglobin F Quantitation: 8.3 % — ABNORMAL HIGH (ref 0.0–2.0)
Hgb A: 55.4 % — ABNORMAL LOW (ref 96.4–98.8)

## 2018-09-14 ENCOUNTER — Ambulatory Visit: Payer: Self-pay | Admitting: Family Medicine

## 2018-09-16 ENCOUNTER — Ambulatory Visit: Payer: Self-pay | Admitting: Family Medicine

## 2018-09-23 ENCOUNTER — Other Ambulatory Visit: Payer: Self-pay | Admitting: Family Medicine

## 2018-09-26 ENCOUNTER — Ambulatory Visit: Payer: Self-pay | Admitting: Family Medicine

## 2018-09-27 ENCOUNTER — Encounter: Payer: Self-pay | Admitting: Family Medicine

## 2018-09-27 ENCOUNTER — Ambulatory Visit (INDEPENDENT_AMBULATORY_CARE_PROVIDER_SITE_OTHER): Payer: Self-pay | Admitting: Family Medicine

## 2018-09-27 VITALS — BP 118/66 | HR 78 | Temp 98.1°F | Ht 63.0 in | Wt 136.4 lb

## 2018-09-27 DIAGNOSIS — Z79891 Long term (current) use of opiate analgesic: Secondary | ICD-10-CM

## 2018-09-27 DIAGNOSIS — Z09 Encounter for follow-up examination after completed treatment for conditions other than malignant neoplasm: Secondary | ICD-10-CM

## 2018-09-27 DIAGNOSIS — G894 Chronic pain syndrome: Secondary | ICD-10-CM

## 2018-09-27 DIAGNOSIS — D571 Sickle-cell disease without crisis: Secondary | ICD-10-CM

## 2018-09-27 LAB — POCT URINALYSIS DIP (MANUAL ENTRY)
Bilirubin, UA: NEGATIVE
Blood, UA: NEGATIVE
Glucose, UA: NEGATIVE mg/dL
Ketones, POC UA: NEGATIVE mg/dL
Leukocytes, UA: NEGATIVE
Nitrite, UA: NEGATIVE
Protein Ur, POC: NEGATIVE mg/dL
Spec Grav, UA: 1.015 (ref 1.010–1.025)
Urobilinogen, UA: 2 E.U./dL — AB
pH, UA: 7.5 (ref 5.0–8.0)

## 2018-09-27 MED ORDER — OXYCODONE HCL 10 MG PO TABS: 10.0000 mg | ORAL_TABLET | ORAL | 0 refills | Status: DC | PRN

## 2018-09-27 NOTE — Progress Notes (Signed)
Patient [facility] Follow Up--Sickle Cell Anemia  Subjective:  Patient ID: Sandra Mckay, female    DOB: October 29, 1995  Age: 23 y.o. MRN: [id number]  CC:  Chief Complaint  Patient presents with  . Hospitalization Follow-up  . Follow-up    sickle cell    HPI Sandra Mckay is a 23 year old female who presents for Hospital Follow Up for Sickle Cell Anemia crisis today.   Past Medical History:  Diagnosis Date  . Blood transfusion without reported diagnosis   . Bronchitis   . Chickenpox   . Depression   . Heart murmur   . Pulmonary hypertension ([place])   . Sickle cell anemia (HCC)   . Urinary tract infection    Current Status: Since her last office visit, she has had multiple ED visits for Sickle Cell Crisis. She is doing well with no complaints. She states that she has pain in all her joints but more in her hips and legs. She rates her pain today at 6-7/10. She has been experienced increased pain crisis since she has became pregnant. She is now [redacted] weeks pregnant. She has been treated at [facility] who has increased her dosage of pain medication and she states that she feels dependent on it and has to take medication more frequently, since she has discontinued taking Hydrea because of pregnancy.  She has not has a hospital visit for Sickle Cell Crisis since 09/23/2018 where she was treated and discharged the same day. She is currently taking all medications as prescribed and staying well hydrated. She reports occasional dizziness and headaches. She has been having a respiratory virus for a few weeks now, which she states is not worsening. She has occasional nausea. Her anxiety has increased today because of her health situation. She denies suicidal ideations, homicidal ideations, or auditory hallucinations.   She denies fevers, chills, fatigue, recent infections, weight loss, and night sweats. She has not had any visual changes, and falls. No  chest pain, heart palpitations, cough and shortness of breath reported. No reports of GI problems such as vomiting, diarrhea, and constipation. She has no reports of blood in stools, dysuria and hematuria.   Past Surgical History:  Procedure Laterality Date  . CHOLECYSTECTOMY  2011  . EYE SURGERY     Sty removal  . LABIAL ADHESION LYSIS  1999  . 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 TONSILLECTOMY  2012    Family History  Problem Relation Age of Onset  . Arthritis Other        grandparent  . Stroke Other   . Hypertension Other   . Diabetes Unknown        grandparent  . Cancer - Other Unknown        Glioblastoma    Social History   Socioeconomic History  . Marital status: Single    Spouse name: Not on file  . Number of children: Not on file  . Years of education: Not on file  . Highest education level: Not on file  Occupational History  . Not on file  Social Needs  . Financial resource strain: Not on file  . Food insecurity:    Worry: Not on file    Inability: Not on file  . Transportation needs:    Medical: Not on file    Non-medical: Not on file  Tobacco Use  . Smoking status: Former Research scientist (life sciences)  . Smokeless  tobacco: Never Used  Substance and Sexual Activity  . Alcohol use: Yes    Alcohol/week: 0.0 standard drinks    Comment: occ  . Drug use: No  . Sexual activity: Yes    Birth control/protection: Implant  Lifestyle  . Physical activity:    Days per week: Not on file    Minutes per session: Not on file  . Stress: Not on file  Relationships  . Social connections:    Talks on phone: Not on file    Gets together: Not on file    Attends religious service: Not on file    Active member of club or organization: Not on file    Attends meetings of clubs or organizations: Not on file    Relationship status: Not on file  . Intimate partner violence:    Fear of current or ex partner: Not on file    Emotionally abused: Not on file     Physically abused: Not on file    Forced sexual activity: Not on file  Other Topics Concern  . Not on file  Social History Narrative   Lives with mom in a one story home.  No children.  Currently not working.  Education: 2 years of college.     Outpatient Medications Prior to Visit  Medication Sig Dispense Refill  . doxylamine, Sleep, (UNISOM) 25 MG tablet Take 25 mg by mouth at bedtime as needed.    . Prenatal Vit-Fe Fumarate-FA (MULTIVITAMIN-PRENATAL) 27-0.8 MG TABS tablet Take 1 tablet by mouth daily at 12 noon.    . sertraline (ZOLOFT) 25 MG tablet Take 25 mg by mouth daily.    . Oxycodone HCl 10 MG TABS Take 10 mg by mouth.    . folic acid (FOLVITE) 1 MG tablet Take 1 tablet (1 mg total) by mouth daily. (Patient not taking: Reported on 08/23/2018) 90 tablet 3  . hydroxyurea (HYDREA) 500 MG capsule TAKE 3 CAPSULES (1,500 MG TOTAL) BY MOUTH DAILY. (Patient not taking: Reported on 06/16/2018) 90 capsule 3  . ibuprofen (ADVIL,MOTRIN) 600 MG tablet Take 1 tablet (600 mg total) by mouth every 8 (eight) hours as needed. (Patient not taking: Reported on 06/16/2018) 30 tablet 3  . L-glutamine (ENDARI) 5 g PACK Powder Packet Take 5 g by mouth 2 (two) times daily. (Patient not taking: Reported on 05/23/2018) 120 each 5  . megestrol (MEGACE) 20 MG tablet Take 1 tablet (20 mg total) by mouth daily. (Patient not taking: Reported on 08/23/2018) 30 tablet 1  . ondansetron (ZOFRAN) 4 MG tablet Take 1 tablet (4 mg total) by mouth every 8 (eight) hours as needed for nausea or vomiting. (Patient not taking: Reported on 08/23/2018) 20 tablet 5  . Vitamin D, Ergocalciferol, (DRISDOL) 50000 units CAPS capsule Take 1 capsule (50,000 Units total) by mouth every Saturday. Saturday. (Patient not taking: Reported on 08/23/2018) 30 capsule 5   No facility-administered medications prior to visit.     Allergies  Allergen Reactions  . Latex Rash  . Other Rash    ROS Review of Systems  Constitutional: Negative.     HENT: Negative.   Eyes: Negative.   Respiratory: Negative.   Cardiovascular: Negative.        Occasional chest discomfort  Gastrointestinal: Positive for abdominal distention ([redacted] weeks gestation) and nausea.  Endocrine: Negative.   Genitourinary: Negative.   Musculoskeletal: Positive for arthralgias (generalized joint pain; lower extremities).  Skin: Negative.   Allergic/Immunologic: Negative.   Neurological: Positive for dizziness and headaches.  Hematological: Negative.   Psychiatric/Behavioral: The patient is nervous/anxious (moderated anxiety ).    Objective:    Physical Exam  Constitutional: She is oriented to person, place, and time. She appears well-developed and well-nourished.  HENT:  Head: Normocephalic and atraumatic.  Eyes: Conjunctivae are normal.  Neck: Normal range of motion. Neck supple.  Cardiovascular: Normal rate, regular rhythm, normal heart sounds and intact distal pulses.  Pulmonary/Chest: Effort normal and breath sounds normal.  Abdominal: Soft. Bowel sounds are normal.  Musculoskeletal: Normal range of motion.  Neurological: She is alert and oriented to person, place, and time. She has normal reflexes.  Skin: Skin is warm and dry.  Psychiatric: She has a normal mood and affect. Her behavior is normal. Judgment and thought content normal.  Nursing note and vitals reviewed.   BP 118/66 (BP Location: Right Arm, Patient Position: Sitting, Cuff Size: Small)   Pulse 78   Temp 98.1 F (36.7 C) (Oral)   Ht 5\' 3"  (1.6 m)   Wt 136 lb 6.4 oz (61.9 kg)   LMP 05/03/2018   SpO2 100%   BMI 24.16 kg/m  Wt Readings from Last 3 Encounters:  09/27/18 136 lb 6.4 oz (61.9 kg)  08/23/18 134 lb 9.6 oz (61.1 kg)  06/16/18 125 lb (56.7 kg)    Health Maintenance Due  Topic Date Due  . PAP-Cervical Cytology Screening  09/01/2014  . PAP SMEAR-Modifier  09/01/2017    There are no preventive care reminders to display for this patient.  No results found for:  TSH Lab Results  Component Value Date   WBC 11.0 (H) 08/23/2018   HGB 7.7 (L) 08/23/2018   HCT 22.6 (L) 08/23/2018   MCV 95 08/23/2018   PLT 443 08/23/2018   Lab Results  Component Value Date   NA 137 08/23/2018   K 4.1 08/23/2018   CO2 19 (L) 08/23/2018   GLUCOSE 62 (L) 08/23/2018   BUN 6 08/23/2018   CREATININE 0.37 (L) 08/23/2018   BILITOT 1.1 08/23/2018   ALKPHOS 61 08/23/2018   AST 28 08/23/2018   ALT 17 08/23/2018   PROT 6.5 08/23/2018   ALBUMIN 4.3 08/23/2018   CALCIUM 9.5 08/23/2018   ANIONGAP 10 06/16/2018   No results found for: CHOL No results found for: HDL No results found for: LDLCALC No results found for: TRIG No results found for: CHOLHDL No results found for: HGBA1C    Assessment & Plan:   She wants to have bi-weekly IVFs a [facility] for preventive crisis.   1. Hospital discharge follow-up  2. Hb-SS disease without crisis [facility]) She is doing fairly well today. Increased frequency of Oxy-IR. She will continue to take pain medications as prescribed; will continue to avoid extreme heat and cold; will continue to eat a healthy diet and drink at least 64 ounces of water daily; continue stool softener as needed; will avoid colds and flu; will continue to get plenty of sleep and rest; will continue to avoid high stressful situations and remain infection free; will continue Folic Acid 1 mg daily to avoid sickle cell crisis.  - Oxycodone HCl 10 MG TABS; Take 1 tablet (10 mg total) by mouth every 4 (four) hours as needed for up to 15 days.  Dispense: 90 tablet; Refill: 0  3. Chronic prescription opiate use - Oxycodone HCl 10 MG TABS; Take 1 tablet (10 mg total) by mouth every 4 (four) hours as needed for up to 15 days.  Dispense: 90 tablet; Refill: 0  4. Chronic pain syndrome We will increase frequency of Oxy-IR to every 4 hours as needed.  - Oxycodone HCl 10 MG TABS; Take 1 tablet (10 mg total) by mouth every 4 (four) hours as needed for up to 15 days.   Dispense: 90 tablet; Refill: 0  5. Follow up She will follow up in 1 month.  - POCT urinalysis dipstick  Meds ordered this encounter  Medications  . Oxycodone HCl 10 MG TABS    Sig: Take 1 tablet (10 mg total) by mouth every 4 (four) hours as needed for up to 15 days.    Dispense:  90 tablet    Refill:  0    Order Specific Question:   Supervising Provider    Answer:   Tresa Garter [id number]    Orders Placed This Encounter  Procedures  . POCT urinalysis dipstick    Referral Orders  No referral(s) requested today    [name],  MSN, FNP-C Patient [address], [place] [address] [phone]   Problem List Items Addressed This Visit      Other   Sickle cell anemia ([place])   Relevant Medications   Oxycodone HCl 10 MG TABS    Other Visit Diagnoses    Hospital discharge follow-up    -  Primary   Chronic prescription opiate use       Relevant Medications   Oxycodone HCl 10 MG TABS   Chronic pain syndrome       Relevant Medications   Oxycodone HCl 10 MG TABS   Follow up       Relevant Orders   POCT urinalysis dipstick (Completed)      Meds ordered this encounter  Medications  . Oxycodone HCl 10 MG TABS    Sig: Take 1 tablet (10 mg total) by mouth every 4 (four) hours as needed for up to 15 days.    Dispense:  90 tablet    Refill:  0    Order Specific Question:   Supervising Provider    Answer:   Tresa Garter [[id number]]    Follow-up: Return in about 1 month (around 10/26/2018).    Azzie Glatter, FNP

## 2018-10-03 ENCOUNTER — Ambulatory Visit: Payer: Self-pay | Admitting: Family Medicine

## 2018-10-26 ENCOUNTER — Telehealth (INDEPENDENT_AMBULATORY_CARE_PROVIDER_SITE_OTHER): Payer: Medicaid Other | Admitting: Family Medicine

## 2018-10-26 ENCOUNTER — Other Ambulatory Visit: Payer: Self-pay

## 2018-10-26 DIAGNOSIS — D571 Sickle-cell disease without crisis: Secondary | ICD-10-CM | POA: Diagnosis not present

## 2018-10-26 DIAGNOSIS — Z79891 Long term (current) use of opiate analgesic: Secondary | ICD-10-CM

## 2018-10-26 DIAGNOSIS — G894 Chronic pain syndrome: Secondary | ICD-10-CM

## 2018-10-26 MED ORDER — OXYCODONE HCL 10 MG PO TABS: 10.0000 mg | ORAL_TABLET | ORAL | 0 refills | Status: DC | PRN

## 2018-10-26 NOTE — Progress Notes (Signed)
Virtual Visit via Telephone Note  I connected with Sandra Mckay on 10/26/18 at 10:20 AM EDT by telephone and verified that I am speaking with the correct person using two identifiers.   I discussed the limitations, risks, security and privacy concerns of performing an evaluation and management service by telephone and the availability of in person appointments. I also discussed with the patient that there may be a patient responsible charge related to this service. The patient expressed understanding and agreed to proceed.   History of Present Illness:   Current Status: Since her last office visit, she has had an ED visit on 10/11/2018 for Sickle Cell Crisis.  She is doing well with no complaints. She states that she has pain in her hips and legs. She rates her pain today at 5/10. She has not has a hospital visit for Sickle Cell Crisis since 10/11/2018 where she was treated and discharged on 10/15/2018. She is currently taking all medications as prescribed and staying well hydrated. She reports occasional nausea, dizziness and headaches. She states that currently she is has some days that she is not taking her pain medications every day. She continues to work at [employer] 2 days a week. She is now [redacted] weeks gestation. Her anxiety is stable today. She reports feeling much better since beginning Zoloft. She denies suicidal ideations, homicidal ideations, or auditory hallucinations.  She denies fevers, chills, fatigue, recent infections, weight loss, and night sweats. She has not had any visual changes, and falls. No chest pain, heart palpitations, cough and shortness of breath reported. No reports of GI problems such as diarrhea, and constipation. She has no reports of blood in stools, dysuria and hematuria.   Observations/Objective:  Telephone Virtual Visit.  Assessment and Plan:  1. Hb-SS disease without crisis [facility] She is doing well today. She will continue to take pain medications as  prescribed; will continue to avoid extreme heat and cold; will continue to eat a healthy diet and drink at least 64 ounces of water daily; continue stool softener as needed; will avoid colds and flu; will continue to get plenty of sleep and rest; will continue to avoid high stressful situations and remain infection free; will continue Folic Acid 1 mg daily to avoid sickle cell crisis.  - Oxycodone HCl 10 MG TABS; Take 1 tablet (10 mg total) by mouth every 4 (four) hours as needed for up to 15 days.  Dispense: 90 tablet; Refill: 0  2. Chronic prescription opiate use - Oxycodone HCl 10 MG TABS; Take 1 tablet (10 mg total) by mouth every 4 (four) hours as needed for up to 15 days.  Dispense: 90 tablet; Refill: 0  3. Chronic pain syndrome - Oxycodone HCl 10 MG TABS; Take 1 tablet (10 mg total) by mouth every 4 (four) hours as needed for up to 15 days.  Dispense: 90 tablet; Refill: 0  4. Anxiety Continue Zoloft as prescribed.   5. Gestation 26-weeks She is tolerating pregnancy well. Physicians work excuse written today.     Follow Up Instructions:  She will follow up in 2 months.     I discussed the assessment and treatment plan with the patient. The patient was provided an opportunity to ask questions and all were answered. The patient agreed with the plan and demonstrated an understanding of the instructions.   The patient was advised to call back or seek an in-person evaluation if the symptoms worsen or if the condition fails to improve as anticipated.  I  provided 15-20 minutes of non-face-to-face time during this encounter.   Azzie Glatter, FNP

## 2018-11-22 ENCOUNTER — Ambulatory Visit: Payer: Self-pay | Admitting: Family Medicine

## 2018-11-24 ENCOUNTER — Telehealth: Payer: Self-pay

## 2018-11-24 ENCOUNTER — Other Ambulatory Visit: Payer: Self-pay | Admitting: Family Medicine

## 2018-11-24 DIAGNOSIS — G894 Chronic pain syndrome: Secondary | ICD-10-CM

## 2018-11-24 DIAGNOSIS — Z79891 Long term (current) use of opiate analgesic: Secondary | ICD-10-CM

## 2018-11-24 DIAGNOSIS — D571 Sickle-cell disease without crisis: Secondary | ICD-10-CM

## 2018-11-24 MED ORDER — OXYCODONE HCL 10 MG PO TABS: 10.0000 mg | ORAL_TABLET | ORAL | 0 refills | Status: AC | PRN

## 2018-11-25 NOTE — Telephone Encounter (Signed)
Patient notified

## 2018-11-29 NOTE — Telephone Encounter (Signed)
Message sent to provider 

## 2018-11-30 NOTE — Telephone Encounter (Signed)
Message sent to provider 

## 2018-12-02 NOTE — Telephone Encounter (Signed)
Message sent to provider 

## 2018-12-08 NOTE — Telephone Encounter (Signed)
Message sent to provider 

## 2018-12-12 ENCOUNTER — Ambulatory Visit (INDEPENDENT_AMBULATORY_CARE_PROVIDER_SITE_OTHER): Payer: Medicaid Other | Admitting: Family Medicine

## 2018-12-12 ENCOUNTER — Other Ambulatory Visit: Payer: Self-pay

## 2018-12-12 DIAGNOSIS — R110 Nausea: Secondary | ICD-10-CM

## 2018-12-12 DIAGNOSIS — G894 Chronic pain syndrome: Secondary | ICD-10-CM

## 2018-12-12 DIAGNOSIS — Z09 Encounter for follow-up examination after completed treatment for conditions other than malignant neoplasm: Secondary | ICD-10-CM

## 2018-12-12 DIAGNOSIS — F329 Major depressive disorder, single episode, unspecified: Secondary | ICD-10-CM

## 2018-12-12 DIAGNOSIS — F32A Depression, unspecified: Secondary | ICD-10-CM

## 2018-12-12 DIAGNOSIS — D571 Sickle-cell disease without crisis: Secondary | ICD-10-CM

## 2018-12-12 DIAGNOSIS — Z79891 Long term (current) use of opiate analgesic: Secondary | ICD-10-CM

## 2018-12-12 DIAGNOSIS — Z79899 Other long term (current) drug therapy: Secondary | ICD-10-CM | POA: Insufficient documentation

## 2018-12-12 MED ORDER — OXYCODONE HCL 15 MG PO TABS: 15.0000 mg | ORAL_TABLET | ORAL | 0 refills | Status: DC | PRN

## 2018-12-12 NOTE — Progress Notes (Signed)
Virtual Visit via Telephone Note  I connected with Sandra Mckay on 12/12/18 at  8:20 AM EDT by telephone and verified that I am speaking with the correct person using two identifiers.   I discussed the limitations, risks, security and privacy concerns of performing an evaluation and management service by telephone and the availability of in person appointments. I also discussed with the patient that there may be a patient responsible charge related to this service. The patient expressed understanding and agreed to proceed.   History of Present Illness:  Past Medical History:  Diagnosis Date  . Blood transfusion without reported diagnosis   . Bronchitis   . Chickenpox   . Depression   . Heart murmur   . Pulmonary hypertension ([place])   . Sickle cell anemia (HCC)   . Urinary tract infection     Current Outpatient Medications on File Prior to Visit  Medication Sig Dispense Refill  . doxylamine, Sleep, (UNISOM) 25 MG tablet Take 25 mg by mouth at bedtime as needed.    . folic acid (FOLVITE) 1 MG tablet Take 1 tablet (1 mg total) by mouth daily. (Patient not taking: Reported on 08/23/2018) 90 tablet 3  . ibuprofen (ADVIL,MOTRIN) 600 MG tablet Take 1 tablet (600 mg total) by mouth every 8 (eight) hours as needed. (Patient not taking: Reported on 06/16/2018) 30 tablet 3  . L-glutamine (ENDARI) 5 g PACK Powder Packet Take 5 g by mouth 2 (two) times daily. (Patient not taking: Reported on 05/23/2018) 120 each 5  . megestrol (MEGACE) 20 MG tablet Take 1 tablet (20 mg total) by mouth daily. (Patient not taking: Reported on 08/23/2018) 30 tablet 1  . ondansetron (ZOFRAN) 4 MG tablet Take 1 tablet (4 mg total) by mouth every 8 (eight) hours as needed for nausea or vomiting. (Patient not taking: Reported on 08/23/2018) 20 tablet 5  . Prenatal Vit-Fe Fumarate-FA (MULTIVITAMIN-PRENATAL) 27-0.8 MG TABS tablet Take 1 tablet by mouth daily at 12 noon.    . sertraline (ZOLOFT) 25 MG tablet Take 25 mg by  mouth daily.    . Vitamin D, Ergocalciferol, (DRISDOL) 50000 units CAPS capsule Take 1 capsule (50,000 Units total) by mouth every Saturday. Saturday. (Patient not taking: Reported on 08/23/2018) 30 capsule 5   No current facility-administered medications on file prior to visit.     Current Status: Since her last office visit, she has had [place] within the last week at [facility]. She also received 2 units of PRBCs while hospitalized. She states that current dose of Oxycodone is not controlling her pain. She states that she has pain in her legs, hips, and lower back. She rates her pain today at 9/10.  She is currently taking all medications as prescribed and staying well hydrated. She reports occasional nausea, constipation, dizziness and headaches. Her anxiety is moderate today, r/t pregnancy. She is currently 33 weeks. She denies suicidal ideations, homicidal ideations, or auditory hallucinations.  She denies fevers, chills, fatigue, recent infections, weight loss, and night sweats. She has not had any visual changes, dizziness, and falls. No chest pain, heart palpitations, cough and shortness of breath reported. No reports of GI problems such as nausea, vomiting, diarrhea, and constipation. She has no reports of blood in stools, dysuria and hematuria.   Observations/Objective:  Telephone Virtual Visit.   Assessment and Plan:  1. Hospital discharge follow-up  2. Hb-SS disease without crisis [facility]) She has been experiencing increasing epsiodes of pain. We will increase Oxy-IR to 15  mg every 4 hours as needed for pain, for better pain management. She will continue to take pain medications as prescribed; will continue to avoid extreme heat and cold; will continue to eat a healthy diet and drink at least 64 ounces of water daily; continue stool softener as needed; will avoid colds and flu; will continue to get plenty of sleep and rest; will continue to avoid high stressful  situations and remain infection free; will continue Folic Acid 1 mg daily to avoid sickle cell crisis.  - oxyCODONE (ROXICODONE) 15 MG immediate release tablet; Take 1 tablet (15 mg total) by mouth every 4 (four) hours as needed for pain.  Dispense: 90 tablet; Refill: 0  3. Chronic prescription opiate use - oxyCODONE (ROXICODONE) 15 MG immediate release tablet; Take 1 tablet (15 mg total) by mouth every 4 (four) hours as needed for pain.  Dispense: 90 tablet; Refill: 0  4. Chronic pain syndrome - oxyCODONE (ROXICODONE) 15 MG immediate release tablet; Take 1 tablet (15 mg total) by mouth every 4 (four) hours as needed for pain.  Dispense: 90 tablet; Refill: 0  5. Medication management We will increase Oxy-IR to 15 mg every 4 hours as needed for pain.   6. Nausea without vomiting Stable. She will continue Compazine as prescribed.   7. Depression, unspecified depression type  Meds ordered this encounter  Medications  . oxyCODONE (ROXICODONE) 15 MG immediate release tablet    Sig: Take 1 tablet (15 mg total) by mouth every 4 (four) hours as needed for pain.    Dispense:  90 tablet    Refill:  0    Dose increase.    Order Specific Question:   Supervising Provider    Answer:   Tresa Garter [[id number]]    No orders of the defined types were placed in this encounter.   Referral Orders  No referral(s) requested today    [name],  MSN, FNP-C Patient [place] [id number] [place], [place] [address] [phone]  Follow Up Instructions:  She will follow up in 2 weeks.    I discussed the assessment and treatment plan with the patient. The patient was provided an opportunity to ask questions and all were answered. The patient agreed with the plan and demonstrated an understanding of the instructions.   The patient was advised to call back or seek an in-person evaluation if the symptoms worsen or if the condition fails to improve as  anticipated.  I provided 20 minutes of non-face-to-face time during this encounter.   Azzie Glatter, FNP

## 2018-12-15 ENCOUNTER — Telehealth: Payer: Self-pay

## 2018-12-15 NOTE — Telephone Encounter (Signed)
Patient picked up her medication

## 2018-12-15 NOTE — Telephone Encounter (Signed)
-----   Message from Azzie Glatter, [place] sent at 12/12/2018 11:51 AM EDT ----- Regarding: "New Rx" Rx for Oxy-IR 15 mg sent to pharmacy today. Please inform patient.

## 2018-12-27 ENCOUNTER — Telehealth: Payer: Self-pay

## 2018-12-27 NOTE — Telephone Encounter (Signed)
Patient would like to do a telephone visit

## 2018-12-28 ENCOUNTER — Other Ambulatory Visit: Payer: Self-pay

## 2018-12-28 ENCOUNTER — Ambulatory Visit (INDEPENDENT_AMBULATORY_CARE_PROVIDER_SITE_OTHER): Payer: Medicaid Other | Admitting: Family Medicine

## 2018-12-28 DIAGNOSIS — Z79891 Long term (current) use of opiate analgesic: Secondary | ICD-10-CM

## 2018-12-28 DIAGNOSIS — Z09 Encounter for follow-up examination after completed treatment for conditions other than malignant neoplasm: Secondary | ICD-10-CM

## 2018-12-28 DIAGNOSIS — D571 Sickle-cell disease without crisis: Secondary | ICD-10-CM

## 2018-12-28 DIAGNOSIS — Z3A35 35 weeks gestation of pregnancy: Secondary | ICD-10-CM

## 2018-12-28 DIAGNOSIS — G894 Chronic pain syndrome: Secondary | ICD-10-CM | POA: Diagnosis not present

## 2018-12-28 DIAGNOSIS — R110 Nausea: Secondary | ICD-10-CM

## 2018-12-28 MED ORDER — OXYCODONE HCL 15 MG PO TABS: 15.0000 mg | ORAL_TABLET | ORAL | 0 refills | Status: DC | PRN

## 2018-12-28 NOTE — Progress Notes (Signed)
Virtual Visit via Telephone Note  I connected with Sandra Mckay on 12/28/18 at 10:00 AM EDT by telephone and verified that I am speaking with the correct person using two identifiers.   I discussed the limitations, risks, security and privacy concerns of performing an evaluation and management service by telephone and the availability of in person appointments. I also discussed with the patient that there may be a patient responsible charge related to this service. The patient expressed understanding and agreed to proceed.  History of Present Illness:  Past Medical History:  Diagnosis Date  . Blood transfusion without reported diagnosis   . Bronchitis   . Chickenpox   . Depression   . Heart murmur   . Pulmonary hypertension ([place])   . Sickle cell anemia (HCC)   . Urinary tract infection     Current Outpatient Medications on File Prior to Visit  Medication Sig Dispense Refill  . doxylamine, Sleep, (UNISOM) 25 MG tablet Take 25 mg by mouth at bedtime as needed.    . folic acid (FOLVITE) 1 MG tablet Take 1 tablet (1 mg total) by mouth daily. 90 tablet 3  . ibuprofen (ADVIL,MOTRIN) 600 MG tablet Take 1 tablet (600 mg total) by mouth every 8 (eight) hours as needed. 30 tablet 3  . L-glutamine (ENDARI) 5 g PACK Powder Packet Take 5 g by mouth 2 (two) times daily. (Patient not taking: Reported on 12/12/2018) 120 each 5  . megestrol (MEGACE) 20 MG tablet Take 1 tablet (20 mg total) by mouth daily. (Patient not taking: Reported on 08/23/2018) 30 tablet 1  . ondansetron (ZOFRAN) 4 MG tablet Take 1 tablet (4 mg total) by mouth every 8 (eight) hours as needed for nausea or vomiting. 20 tablet 5  . Prenatal Vit-Fe Fumarate-FA (MULTIVITAMIN-PRENATAL) 27-0.8 MG TABS tablet Take 1 tablet by mouth daily at 12 noon.    . sertraline (ZOLOFT) 25 MG tablet Take 25 mg by mouth daily.    . Vitamin D, Ergocalciferol, (DRISDOL) 50000 units CAPS capsule Take 1 capsule (50,000 Units total) by mouth every  Saturday. Saturday. 30 capsule 5   No current facility-administered medications on file prior to visit.     Current Status: Since her last office visit, she is doing well with no complaints. She states that she has pain in her legs, hips, and lower back. She rates her pain today at 7/10, as she has not taken any pain medication as of yet today. She has not had a hospital visit for Sickle Cell Crisis since 12/09/2018 where she was treated and discharged the same day. She is currently taking all medications as prescribed and staying well hydrated. She reports occasional nausea, constipation, dizziness and headaches. She states that since Oxycodone short-acting medication from 10 mg to 15 mg, she has been able to manage pain at home with infrequent ED visits. Her pregnancy is going well, and she is now 35 weeks pregnancy. Her anxiety is mild today. She denies suicidal ideations, homicidal ideations, or auditory hallucinations.   She denies fevers, chills, fatigue, recent infections, weight loss, and night sweats. She has not had any visual changes, and falls. No chest pain, heart palpitations, cough and shortness of breath reported. No reports of GI problems such as diarrhea. She has no reports of blood in stools, dysuria and hematuria.   Observations/Objective:  Telephone Virtual Visit   Assessment and Plan:  1. Hb-SS disease without crisis ([place]) Recent dose increase of Oxycodone short acting from 10 mg to 15  mg has been effective in home pain management. She is doing well today. She will continue to take pain medications as prescribed; will continue to avoid extreme heat and cold; will continue to eat a healthy diet and drink at least 64 ounces of water daily; continue stool softener as needed; will avoid colds and flu; will continue to get plenty of sleep and rest; will continue to avoid high stressful situations and remain infection free; will continue Folic Acid 1 mg daily to avoid sickle cell  crisis.  - oxyCODONE (ROXICODONE) 15 MG immediate release tablet; Take 1 tablet (15 mg total) by mouth every 4 (four) hours as needed for up to 15 days for pain.  Dispense: 90 tablet; Refill: 0  2. [redacted] weeks gestation of pregnancy Tolerating pregnancy well. Possibly to be induced at 38 weeks if she has not begun labor by that time. We will continue to monitor.   3. Chronic prescription opiate use - oxyCODONE (ROXICODONE) 15 MG immediate release tablet; Take 1 tablet (15 mg total) by mouth every 4 (four) hours as needed for up to 15 days for pain.  Dispense: 90 tablet; Refill: 0  4. Chronic pain syndrome - oxyCODONE (ROXICODONE) 15 MG immediate release tablet; Take 1 tablet (15 mg total) by mouth every 4 (four) hours as needed for up to 15 days for pain.  Dispense: 90 tablet; Refill: 0  5. Nausea without vomiting Stable today.  Meds ordered this encounter  Medications  . oxyCODONE (ROXICODONE) 15 MG immediate release tablet    Sig: Take 1 tablet (15 mg total) by mouth every 4 (four) hours as needed for up to 15 days for pain.    Dispense:  90 tablet    Refill:  0    Dose increase.    Order Specific Question:   Supervising Provider    Answer:   Tresa Garter [[id number]]    No orders of the defined types were placed in this encounter.   Referral Orders  No referral(s) requested today    [name],  MSN, FNP-BC [name], [place] [phone]    Follow Up Instructions:  She will follow up in 2 months.   I discussed the assessment and treatment plan with the patient. The patient was provided an opportunity to ask questions and all were answered. The patient agreed with the plan and demonstrated an understanding of the instructions.   The patient was advised to call back or seek an in-person evaluation if the symptoms worsen or if the condition fails to improve as anticipated.  I provided 20  minutes of non-face-to-face time during this encounter.   Azzie Glatter, FNP

## 2018-12-29 DIAGNOSIS — B3731 Acute candidiasis of vulva and vagina: Secondary | ICD-10-CM | POA: Insufficient documentation

## 2018-12-29 DIAGNOSIS — Z3A35 35 weeks gestation of pregnancy: Secondary | ICD-10-CM | POA: Insufficient documentation

## 2018-12-29 DIAGNOSIS — O358XX0 Maternal care for other (suspected) fetal abnormality and damage, not applicable or unspecified: Secondary | ICD-10-CM | POA: Insufficient documentation

## 2019-01-09 DIAGNOSIS — Z98891 History of uterine scar from previous surgery: Secondary | ICD-10-CM | POA: Insufficient documentation

## 2019-01-13 ENCOUNTER — Other Ambulatory Visit: Payer: Self-pay | Admitting: Family Medicine

## 2019-01-13 ENCOUNTER — Telehealth: Payer: Self-pay | Admitting: Family Medicine

## 2019-01-13 DIAGNOSIS — M62838 Other muscle spasm: Secondary | ICD-10-CM

## 2019-01-13 MED ORDER — TIZANIDINE HCL 4 MG PO CAPS: 4.0000 mg | ORAL_CAPSULE | Freq: Three times a day (TID) | ORAL | 1 refills | Status: DC

## 2019-01-13 NOTE — Telephone Encounter (Signed)
Patient states that she had a c-section and has been having really bad muscle spasms. Patient would like to have a muscle relaxer called into the [facility].

## 2019-01-13 NOTE — Telephone Encounter (Signed)
Patient notified

## 2019-01-23 ENCOUNTER — Ambulatory Visit: Payer: Medicaid Other | Admitting: Family Medicine

## 2019-01-24 ENCOUNTER — Telehealth: Payer: Self-pay

## 2019-01-24 ENCOUNTER — Other Ambulatory Visit: Payer: Self-pay | Admitting: Family Medicine

## 2019-01-24 DIAGNOSIS — D571 Sickle-cell disease without crisis: Secondary | ICD-10-CM

## 2019-01-24 DIAGNOSIS — Z79891 Long term (current) use of opiate analgesic: Secondary | ICD-10-CM

## 2019-01-24 DIAGNOSIS — G894 Chronic pain syndrome: Secondary | ICD-10-CM

## 2019-01-24 MED ORDER — OXYCODONE HCL 15 MG PO TABS: 15.0000 mg | ORAL_TABLET | ORAL | 0 refills | Status: DC | PRN

## 2019-01-24 NOTE — Telephone Encounter (Signed)
Patient notified

## 2019-01-26 ENCOUNTER — Ambulatory Visit: Payer: Medicaid Other | Admitting: Family Medicine

## 2019-01-26 ENCOUNTER — Telehealth: Payer: Self-pay

## 2019-01-26 NOTE — Telephone Encounter (Signed)
Patient would like to do a telephone visit because she is unable to bring her child into the office.

## 2019-01-27 ENCOUNTER — Ambulatory Visit: Payer: Medicaid Other | Admitting: Family Medicine

## 2019-01-27 ENCOUNTER — Telehealth: Payer: Self-pay | Admitting: Family Medicine

## 2019-01-27 ENCOUNTER — Other Ambulatory Visit: Payer: Self-pay

## 2019-01-27 ENCOUNTER — Ambulatory Visit (INDEPENDENT_AMBULATORY_CARE_PROVIDER_SITE_OTHER): Payer: Self-pay | Admitting: Family Medicine

## 2019-01-27 DIAGNOSIS — Z5329 Procedure and treatment not carried out because of patient's decision for other reasons: Secondary | ICD-10-CM

## 2019-01-27 NOTE — Telephone Encounter (Signed)
Several attempts to contact patient today concerning Telephone Virtual Visit. Will attempt to contact patient later.

## 2019-01-27 NOTE — Progress Notes (Deleted)
Virtual Visit via Telephone Note  I connected with Sandra Mckay on 01/27/19 at  8:40 AM EDT by telephone and verified that I am speaking with the correct person using two identifiers.   I discussed the limitations, risks, security and privacy concerns of performing an evaluation and management service by telephone and the availability of in person appointments. I also discussed with the patient that there may be a patient responsible charge related to this service. The patient expressed understanding and agreed to proceed.   History of Present Illness:  Past Medical History:  Diagnosis Date  . Blood transfusion without reported diagnosis   . Bronchitis   . Chickenpox   . Depression   . Heart murmur   . Pulmonary hypertension ([place])   . Sickle cell anemia (HCC)   . Urinary tract infection     Current Outpatient Medications on File Prior to Visit  Medication Sig Dispense Refill  . doxylamine, Sleep, (UNISOM) 25 MG tablet Take 25 mg by mouth at bedtime as needed.    . folic acid (FOLVITE) 1 MG tablet Take 1 tablet (1 mg total) by mouth daily. 90 tablet 3  . ibuprofen (ADVIL,MOTRIN) 600 MG tablet Take 1 tablet (600 mg total) by mouth every 8 (eight) hours as needed. 30 tablet 3  . L-glutamine (ENDARI) 5 g PACK Powder Packet Take 5 g by mouth 2 (two) times daily. (Patient not taking: Reported on 12/12/2018) 120 each 5  . megestrol (MEGACE) 20 MG tablet Take 1 tablet (20 mg total) by mouth daily. (Patient not taking: Reported on 08/23/2018) 30 tablet 1  . ondansetron (ZOFRAN) 4 MG tablet Take 1 tablet (4 mg total) by mouth every 8 (eight) hours as needed for nausea or vomiting. 20 tablet 5  . oxyCODONE (ROXICODONE) 15 MG immediate release tablet Take 1 tablet (15 mg total) by mouth every 4 (four) hours as needed for up to 15 days for pain. 90 tablet 0  . Prenatal Vit-Fe Fumarate-FA (MULTIVITAMIN-PRENATAL) 27-0.8 MG TABS tablet Take 1 tablet by mouth daily at 12 noon.    . sertraline  (ZOLOFT) 25 MG tablet Take 25 mg by mouth daily.    Marland Kitchen tiZANidine (ZANAFLEX) 4 MG capsule Take 1 capsule (4 mg total) by mouth 3 (three) times daily. 60 capsule 1  . Vitamin D, Ergocalciferol, (DRISDOL) 50000 units CAPS capsule Take 1 capsule (50,000 Units total) by mouth every Saturday. Saturday. 30 capsule 5   No current facility-administered medications on file prior to visit.     Current Status: Since *** last office visit, *** is doing well with no complaints.*** states that *** has pain in *** arms and legs. *** rates *** pain today at 5/10. *** has not had a hospital visit for Sickle Cell Crisis since 01/10/2019 where *** treated and discharged the same day. *** is currently taking all medications as prescribed and staying well hydrated. *** reports occasional nausea, constipation, dizziness and headaches.        She denies fevers, chills, fatigue, recent infections, weight loss, and night sweats. She has not had any headaches, visual changes, dizziness, and falls. No chest pain, heart palpitations, cough and shortness of breath reported. No reports of GI problems such as nausea, vomiting, diarrhea, and constipation. She has no reports of blood in stools, dysuria and hematuria. No depression or anxiety, and denies suicidal ideations, homicidal ideations, or auditory hallucinations. She denies pain today.       Observations/Objective:  Telephone Virtual Visit   Assessment  and Plan:   Follow Up Instructions:    I discussed the assessment and treatment plan with the patient. The patient was provided an opportunity to ask questions and all were answered. The patient agreed with the plan and demonstrated an understanding of the instructions.   The patient was advised to call back or seek an in-person evaluation if the symptoms worsen or if the condition fails to improve as anticipated.  I provided *** minutes of non-face-to-face time during this encounter.   Azzie Glatter,  FNP

## 2019-02-08 ENCOUNTER — Telehealth: Payer: Self-pay

## 2019-02-10 ENCOUNTER — Other Ambulatory Visit: Payer: Self-pay | Admitting: Family Medicine

## 2019-02-10 ENCOUNTER — Other Ambulatory Visit: Payer: Self-pay

## 2019-02-10 DIAGNOSIS — D571 Sickle-cell disease without crisis: Secondary | ICD-10-CM

## 2019-02-10 DIAGNOSIS — G894 Chronic pain syndrome: Secondary | ICD-10-CM

## 2019-02-10 DIAGNOSIS — Z79891 Long term (current) use of opiate analgesic: Secondary | ICD-10-CM

## 2019-02-10 MED ORDER — OXYCODONE HCL 15 MG PO TABS: 15.0000 mg | ORAL_TABLET | ORAL | 0 refills | Status: DC | PRN

## 2019-02-10 NOTE — Telephone Encounter (Signed)
Patient notified

## 2019-02-10 NOTE — Telephone Encounter (Signed)
Patient calling again in regards to her Oxycodone being refilled. Patient advise that I will give her a call once medication is sent into pharmacy

## 2019-02-13 ENCOUNTER — Ambulatory Visit (INDEPENDENT_AMBULATORY_CARE_PROVIDER_SITE_OTHER): Payer: Medicaid Other | Admitting: Family Medicine

## 2019-02-13 ENCOUNTER — Other Ambulatory Visit: Payer: Self-pay

## 2019-02-13 DIAGNOSIS — Z79891 Long term (current) use of opiate analgesic: Secondary | ICD-10-CM

## 2019-02-13 DIAGNOSIS — Z09 Encounter for follow-up examination after completed treatment for conditions other than malignant neoplasm: Secondary | ICD-10-CM

## 2019-02-13 DIAGNOSIS — D571 Sickle-cell disease without crisis: Secondary | ICD-10-CM | POA: Diagnosis not present

## 2019-02-13 DIAGNOSIS — G894 Chronic pain syndrome: Secondary | ICD-10-CM | POA: Diagnosis not present

## 2019-02-13 NOTE — Progress Notes (Signed)
Virtual Visit via Telephone Note  I connected with Sandra Mckay on 02/13/19 at  8:40 AM EDT by telephone and verified that I am speaking with the correct person using two identifiers.   I discussed the limitations, risks, security and privacy concerns of performing an evaluation and management service by telephone and the availability of in person appointments. I also discussed with the patient that there may be a patient responsible charge related to this service. The patient expressed understanding and agreed to proceed.   History of Present Illness:  Past Medical History:  Diagnosis Date  . Blood transfusion without reported diagnosis   . Bronchitis   . Chickenpox   . Depression   . Heart murmur   . Pulmonary hypertension ([place])   . Sickle cell anemia (HCC)   . Urinary tract infection     Observations/Objective:  Current Status: Since her last office visit, she is doing well with no complaints. She states that she has pain in her back and legs. She rates her pain today at 6/10. She has not had a hospital visit for Sickle Cell Crisis since her where she was treated and discharged the same day. She is currently taking all medications as prescribed and staying well hydrated. She reports occasional nausea, constipation, dizziness and headaches. Her baby is almost 6 weeks now. She is not breastfeeding baby. She is wanting to restart Hydrea. Her anxiety is mild today. She has discontinued Zoloft and will be following up with Psychiatry soon. She denies suicidal ideations, homicidal ideations, or auditory hallucinations.   She denies fevers, chills, fatigue, recent infections, weight loss, and night sweats. She has not had any visual changes, and falls. No chest pain, heart palpitations, cough and shortness of breath reported. No reports of GI problems such as vomiting, diarrhea, and constipation. She has no reports of blood in stools, dysuria and hematuria.   Assessment and Plan:  1.  Hospital discharge follow-up Hospitalized for the birth of her daughter 6 weeks ago.   2. Hb-SS disease without crisis [facility]) She is doing well today. She will restart Hydrea at this time. She will continue to take pain medications as prescribed; will continue to avoid extreme heat and cold; will continue to eat a healthy diet and drink at least 64 ounces of water daily; continue stool softener as needed; will avoid colds and flu; will continue to get plenty of sleep and rest; will continue to avoid high stressful situations and remain infection free; will continue Folic Acid 1 mg daily to avoid sickle cell crisis.   3. Chronic prescription opiate use  4. Chronic pain syndrome  No orders of the defined types were placed in this encounter.  No orders of the defined types were placed in this encounter.   Referral Orders  No referral(s) requested today    [name],  MSN, FNP-BC Patient [place] [address] [place], [place] [address] [phone]    Follow Up Instructions: She will follow up in 2 months.     I discussed the assessment and treatment plan with the patient. The patient was provided an opportunity to ask questions and all were answered. The patient agreed with the plan and demonstrated an understanding of the instructions.   The patient was advised to call back or seek an in-person evaluation if the symptoms worsen or if the condition fails to improve as anticipated.  I provided 20 minutes of non-face-to-face time during this encounter.  Azzie Glatter, FNP

## 2019-02-14 NOTE — Telephone Encounter (Signed)
Patient notified

## 2019-02-14 NOTE — Telephone Encounter (Signed)
-----   Message from Azzie Glatter, FNP sent at 02/13/2019 10:44 PM EDT ----- Regarding: "Hydrea" Okay for patient to restart Hydrea. Please inform patient.

## 2019-02-20 ENCOUNTER — Ambulatory Visit: Payer: Medicaid Other | Admitting: Family Medicine

## 2019-02-27 ENCOUNTER — Telehealth: Payer: Self-pay

## 2019-02-27 ENCOUNTER — Other Ambulatory Visit: Payer: Self-pay | Admitting: Family Medicine

## 2019-02-27 DIAGNOSIS — D571 Sickle-cell disease without crisis: Secondary | ICD-10-CM

## 2019-02-27 DIAGNOSIS — G894 Chronic pain syndrome: Secondary | ICD-10-CM

## 2019-02-27 DIAGNOSIS — Z79891 Long term (current) use of opiate analgesic: Secondary | ICD-10-CM

## 2019-02-27 MED ORDER — OXYCODONE HCL 15 MG PO TABS: 15.0000 mg | ORAL_TABLET | ORAL | 0 refills | Status: DC | PRN

## 2019-02-27 NOTE — Telephone Encounter (Signed)
Patient needs a refill on Oxycodone.Patient called and left a vm on Friday for refill.

## 2019-02-27 NOTE — Telephone Encounter (Signed)
Patient notified and will pick up medication  

## 2019-03-09 ENCOUNTER — Telehealth: Payer: Self-pay

## 2019-03-11 ENCOUNTER — Other Ambulatory Visit: Payer: Self-pay | Admitting: Family Medicine

## 2019-03-11 DIAGNOSIS — G894 Chronic pain syndrome: Secondary | ICD-10-CM

## 2019-03-11 DIAGNOSIS — Z79891 Long term (current) use of opiate analgesic: Secondary | ICD-10-CM

## 2019-03-11 DIAGNOSIS — D571 Sickle-cell disease without crisis: Secondary | ICD-10-CM

## 2019-03-11 MED ORDER — OXYCODONE HCL 15 MG PO TABS: 15.0000 mg | ORAL_TABLET | ORAL | 0 refills | Status: DC | PRN

## 2019-03-13 NOTE — Telephone Encounter (Signed)
Patient notified

## 2019-03-28 ENCOUNTER — Telehealth: Payer: Self-pay

## 2019-03-29 ENCOUNTER — Other Ambulatory Visit: Payer: Self-pay | Admitting: Family Medicine

## 2019-03-29 DIAGNOSIS — D571 Sickle-cell disease without crisis: Secondary | ICD-10-CM

## 2019-03-29 DIAGNOSIS — G894 Chronic pain syndrome: Secondary | ICD-10-CM

## 2019-03-29 DIAGNOSIS — Z79891 Long term (current) use of opiate analgesic: Secondary | ICD-10-CM

## 2019-03-29 MED ORDER — OXYCODONE HCL 15 MG PO TABS: 15.0000 mg | ORAL_TABLET | ORAL | 0 refills | Status: DC | PRN

## 2019-03-29 NOTE — Telephone Encounter (Signed)
Called and informed patient. 

## 2019-04-12 ENCOUNTER — Telehealth: Payer: Self-pay

## 2019-04-12 ENCOUNTER — Other Ambulatory Visit: Payer: Self-pay | Admitting: Family Medicine

## 2019-04-12 DIAGNOSIS — Z79891 Long term (current) use of opiate analgesic: Secondary | ICD-10-CM

## 2019-04-12 DIAGNOSIS — G894 Chronic pain syndrome: Secondary | ICD-10-CM

## 2019-04-12 DIAGNOSIS — D571 Sickle-cell disease without crisis: Secondary | ICD-10-CM

## 2019-04-12 MED ORDER — OXYCODONE HCL 15 MG PO TABS: 15.0000 mg | ORAL_TABLET | ORAL | 0 refills | Status: DC | PRN

## 2019-04-14 NOTE — Telephone Encounter (Signed)
Tried to call pt her voicemail hasn't been set up.

## 2019-04-17 ENCOUNTER — Ambulatory Visit: Payer: Medicaid Other | Admitting: Family Medicine

## 2019-04-24 MED ORDER — ACETAMINOPHEN 325 MG PO TABS
650.00 | ORAL_TABLET | ORAL | Status: DC
Start: ? — End: 2019-04-24

## 2019-04-24 MED ORDER — Medication
Status: DC
Start: ? — End: 2019-04-24

## 2019-04-24 MED ORDER — SODIUM CHLORIDE 0.9 % IV SOLN
10.00 | INTRAVENOUS | Status: DC
Start: ? — End: 2019-04-24

## 2019-04-24 MED ORDER — LOVENOX 150 MG/ML ~~LOC~~ SOLN
1.00 | SUBCUTANEOUS | Status: DC
Start: ? — End: 2019-04-24

## 2019-04-26 ENCOUNTER — Telehealth: Payer: Self-pay | Admitting: Family Medicine

## 2019-04-26 NOTE — Telephone Encounter (Signed)
Pt made an appt , pt is requesting refill,please advise.

## 2019-04-27 ENCOUNTER — Other Ambulatory Visit: Payer: Self-pay | Admitting: Family Medicine

## 2019-04-27 DIAGNOSIS — G894 Chronic pain syndrome: Secondary | ICD-10-CM

## 2019-04-27 DIAGNOSIS — Z79891 Long term (current) use of opiate analgesic: Secondary | ICD-10-CM

## 2019-04-27 DIAGNOSIS — D571 Sickle-cell disease without crisis: Secondary | ICD-10-CM

## 2019-04-27 MED ORDER — OXYCODONE HCL 15 MG PO TABS: 15.0000 mg | ORAL_TABLET | ORAL | 0 refills | Status: DC | PRN

## 2019-04-27 NOTE — Telephone Encounter (Signed)
Called and informed pt of this.

## 2019-05-01 ENCOUNTER — Other Ambulatory Visit: Payer: Self-pay

## 2019-05-01 ENCOUNTER — Ambulatory Visit (INDEPENDENT_AMBULATORY_CARE_PROVIDER_SITE_OTHER): Payer: Medicaid Other | Admitting: Family Medicine

## 2019-05-01 DIAGNOSIS — D571 Sickle-cell disease without crisis: Secondary | ICD-10-CM | POA: Diagnosis not present

## 2019-05-01 DIAGNOSIS — Z79891 Long term (current) use of opiate analgesic: Secondary | ICD-10-CM

## 2019-05-01 DIAGNOSIS — F319 Bipolar disorder, unspecified: Secondary | ICD-10-CM

## 2019-05-01 DIAGNOSIS — G894 Chronic pain syndrome: Secondary | ICD-10-CM | POA: Diagnosis not present

## 2019-05-01 DIAGNOSIS — Z09 Encounter for follow-up examination after completed treatment for conditions other than malignant neoplasm: Secondary | ICD-10-CM

## 2019-05-01 NOTE — Progress Notes (Signed)
Virtual Visit via Telephone Note  I connected with Sandra Mckay on 05/01/19 at  8:40 AM EDT by telephone and verified that I am speaking with the correct person using two identifiers.   I discussed the limitations, risks, security and privacy concerns of performing an evaluation and management service by telephone and the availability of in person appointments. I also discussed with the patient that there may be a patient responsible charge related to this service. The patient expressed understanding and agreed to proceed.   History of Present Illness: Past Medical History:  Diagnosis Date  . Blood transfusion without reported diagnosis   . Bronchitis   . Chickenpox   . Depression   . Heart murmur   . Pulmonary hypertension ([place])   . Sickle cell anemia (HCC)   . Urinary tract infection     Allergies  Allergen Reactions  . Latex Rash  . Other Rash    Current Outpatient Medications on File Prior to Visit  Medication Sig Dispense Refill  . doxylamine, Sleep, (UNISOM) 25 MG tablet Take 25 mg by mouth at bedtime as needed.    Marland Kitchen ibuprofen (ADVIL,MOTRIN) 600 MG tablet Take 1 tablet (600 mg total) by mouth every 8 (eight) hours as needed. 30 tablet 3  . ondansetron (ZOFRAN) 4 MG tablet Take 1 tablet (4 mg total) by mouth every 8 (eight) hours as needed for nausea or vomiting. 20 tablet 5  . oxyCODONE (ROXICODONE) 15 MG immediate release tablet Take 1 tablet (15 mg total) by mouth every 4 (four) hours as needed for up to 15 days for pain. 90 tablet 0  . Prenatal Vit-Fe Fumarate-FA (MULTIVITAMIN-PRENATAL) 27-0.8 MG TABS tablet Take 1 tablet by mouth daily at 12 noon.    . sertraline (ZOLOFT) 25 MG tablet Take 25 mg by mouth daily.    Marland Kitchen tiZANidine (ZANAFLEX) 4 MG capsule Take 1 capsule (4 mg total) by mouth 3 (three) times daily. 60 capsule 1  . Vitamin D, Ergocalciferol, (DRISDOL) 50000 units CAPS capsule Take 1 capsule (50,000 Units total) by mouth every Saturday. Saturday. 30 capsule 5   . folic acid (FOLVITE) 1 MG tablet Take 1 tablet (1 mg total) by mouth daily. (Patient not taking: Reported on 02/13/2019) 90 tablet 3  . megestrol (MEGACE) 20 MG tablet Take 1 tablet (20 mg total) by mouth daily. (Patient not taking: Reported on 08/23/2018) 30 tablet 1   No current facility-administered medications on file prior to visit.      Current Status: Since her last office visit, she is doing well with no complaints. She states that she has pain in her legs. She rates her pain today at 7/10. She has not had a hospital visit for Sickle Cell Crisis since 04/22/2019 where she was treated and discharged the same day. She is currently taking all medications as prescribed and staying well hydrated. She reports occasional nausea, constipation, dizziness and headaches. She recently had blood transfusion on 04/23/2019. Her anxiety is moderate today. She denies suicidal ideations, homicidal ideations, or auditory hallucinations. She denies fevers, chills, fatigue, recent infections, weight loss, and night sweats. She has not had any visual changes, and falls. No chest pain, heart palpitations, cough and shortness of breath reported. She reports occasional constipation. No reports of GI problems such as vomiting and diarrhea. She has no reports of blood in stools, dysuria and hematuria.   Observations/Objective:  Telephone Virtual Visit   Assessment and Plan:  1. 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 She will continue to take pain medications as prescribed; will continue to avoid extreme heat and cold; will continue to eat a healthy diet and drink at least 64 ounces of water daily; continue stool softener as needed; will avoid colds and flu; will continue to get plenty of sleep and rest; will continue to avoid high stressful situations and remain infection free; will continue Folic Acid 1 mg daily to avoid sickle cell crisis.   2. Chronic pain syndrome  3. Chronic prescription  opiate use  4. Bipolar and related disorder ([place]) Stable.   No orders of the defined types were placed in this encounter.  No orders of the defined types were placed in this encounter.   Referral Orders  No referral(s) requested today    [name],  MSN, FNP-BC [facility] [address] [place], [place] [address] [phone] fax   Follow Up Instructions: She will follow up in 1 week.   I discussed the assessment and treatment plan with the patient. The patient was provided an opportunity to ask questions and all were answered. The patient agreed with the plan and demonstrated an understanding of the instructions.   The patient was advised to call back or seek an in-person evaluation if the symptoms worsen or if the condition fails to improve as anticipated.  I provided 15 minutes of non-face-to-face time during this encounter.   Azzie Glatter, FNP

## 2019-05-04 DIAGNOSIS — R7989 Other specified abnormal findings of blood chemistry: Secondary | ICD-10-CM

## 2019-05-04 DIAGNOSIS — D473 Essential (hemorrhagic) thrombocythemia: Secondary | ICD-10-CM

## 2019-05-04 DIAGNOSIS — D75839 Thrombocytosis, unspecified: Secondary | ICD-10-CM

## 2019-05-04 HISTORY — DX: Thrombocytosis, unspecified: D75.839

## 2019-05-04 HISTORY — DX: Other specified abnormal findings of blood chemistry: R79.89

## 2019-05-04 HISTORY — DX: Essential (hemorrhagic) thrombocythemia: D47.3

## 2019-05-08 ENCOUNTER — Ambulatory Visit: Payer: Medicaid Other | Admitting: Family Medicine

## 2019-05-09 ENCOUNTER — Other Ambulatory Visit: Payer: Self-pay

## 2019-05-09 ENCOUNTER — Emergency Department (HOSPITAL_COMMUNITY)
Admission: EM | Admit: 2019-05-09 | Discharge: 2019-05-09 | Disposition: A | Discharge status: Home / Self Care | Payer: Medicaid Other | Attending: Emergency Medicine | Admitting: Emergency Medicine

## 2019-05-09 ENCOUNTER — Encounter (HOSPITAL_COMMUNITY): Payer: Self-pay | Admitting: Emergency Medicine

## 2019-05-09 DIAGNOSIS — Z79899 Other long term (current) drug therapy: Secondary | ICD-10-CM | POA: Insufficient documentation

## 2019-05-09 DIAGNOSIS — M545 Low back pain: Secondary | ICD-10-CM | POA: Diagnosis present

## 2019-05-09 DIAGNOSIS — D5700 Hb-SS disease with crisis, unspecified: Secondary | ICD-10-CM

## 2019-05-09 DIAGNOSIS — Z87891 Personal history of nicotine dependence: Secondary | ICD-10-CM | POA: Insufficient documentation

## 2019-05-09 DIAGNOSIS — Z91040 Latex allergy status: Secondary | ICD-10-CM | POA: Insufficient documentation

## 2019-05-09 DIAGNOSIS — D57219 Sickle-cell/Hb-C disease with crisis, unspecified: Secondary | ICD-10-CM | POA: Diagnosis not present

## 2019-05-09 LAB — COMPREHENSIVE METABOLIC PANEL WITH GFR
ALT: 16 U/L (ref 0–44)
AST: 28 U/L (ref 15–41)
Albumin: 5 g/dL (ref 3.5–5.0)
Alkaline Phosphatase: 87 U/L (ref 38–126)
Anion gap: 7 (ref 5–15)
BUN: 7 mg/dL (ref 6–20)
CO2: 23 mmol/L (ref 22–32)
Calcium: 9.3 mg/dL (ref 8.9–10.3)
Chloride: 108 mmol/L (ref 98–111)
Creatinine, Ser: 0.49 mg/dL (ref 0.44–1.00)
GFR calc Af Amer: >60 mL/min
GFR calc non Af Amer: >60 mL/min
Glucose, Bld: 93 mg/dL (ref 70–99)
Potassium: 4 mmol/L (ref 3.5–5.1)
Sodium: 138 mmol/L (ref 135–145)
Total Bilirubin: 2.2 mg/dL — ABNORMAL HIGH (ref 0.3–1.2)
Total Protein: 7.6 g/dL (ref 6.5–8.1)

## 2019-05-09 LAB — CBC WITH DIFFERENTIAL/PLATELET
Abs Immature Granulocytes: 0.05 10*3/uL (ref 0.00–0.07)
Basophils Absolute: 0.1 10*3/uL (ref 0.0–0.1)
Basophils Relative: 1 %
Eosinophils Absolute: 0.1 10*3/uL (ref 0.0–0.5)
Eosinophils Relative: 0 %
HCT: 23.2 % — ABNORMAL LOW (ref 36.0–46.0)
Hemoglobin: 7.9 g/dL — ABNORMAL LOW (ref 12.0–15.0)
Immature Granulocytes: 0 %
Lymphocytes Relative: 25 %
Lymphs Abs: 3.1 10*3/uL (ref 0.7–4.0)
MCH: 34.1 pg — ABNORMAL HIGH (ref 26.0–34.0)
MCHC: 34.1 g/dL (ref 30.0–36.0)
MCV: 100 fL (ref 80.0–100.0)
Monocytes Absolute: 2.2 10*3/uL — ABNORMAL HIGH (ref 0.1–1.0)
Monocytes Relative: 18 %
Neutro Abs: 7 10*3/uL (ref 1.7–7.7)
Neutrophils Relative %: 56 %
Platelets: 565 10*3/uL — ABNORMAL HIGH (ref 150–400)
RBC: 2.32 MIL/uL — ABNORMAL LOW (ref 3.87–5.11)
RDW: 19.4 % — ABNORMAL HIGH (ref 11.5–15.5)
WBC: 12.6 10*3/uL — ABNORMAL HIGH (ref 4.0–10.5)
nRBC: 0.4 % — ABNORMAL HIGH (ref 0.0–0.2)

## 2019-05-09 LAB — RETICULOCYTES
Immature Retic Fract: 27.8 % — ABNORMAL HIGH (ref 2.3–15.9)
RBC.: 2.32 MIL/uL — ABNORMAL LOW (ref 3.87–5.11)
Retic Count, Absolute: 403.7 10*3/uL — ABNORMAL HIGH (ref 19.0–186.0)
Retic Ct Pct: 17.4 % — ABNORMAL HIGH (ref 0.4–3.1)

## 2019-05-09 LAB — I-STAT BETA HCG BLOOD, ED (MC, WL, AP ONLY): I-stat hCG, quantitative: <5 m[IU]/mL (ref <5)

## 2019-05-09 MED ORDER — HYDROMORPHONE HCL 2 MG/ML IJ SOLN: 2.0000 mg | INTRAMUSCULAR | Status: AC

## 2019-05-09 MED ORDER — DIPHENHYDRAMINE HCL 25 MG PO CAPS: 25.0000 mg | ORAL_CAPSULE | ORAL | Status: DC | PRN

## 2019-05-09 MED ORDER — ONDANSETRON HCL 4 MG/2ML IJ SOLN
4.0000 mg | Freq: Once | INTRAMUSCULAR | Status: AC
  Administered 2019-05-09: 20:00:00 4 mg via INTRAVENOUS
  Filled 2019-05-09: qty 2

## 2019-05-09 MED ORDER — HYDROMORPHONE HCL 2 MG/ML IJ SOLN
2.0000 mg | INTRAMUSCULAR | Status: AC
  Administered 2019-05-09: 2 mg via INTRAVENOUS
  Filled 2019-05-09: qty 1

## 2019-05-09 MED ORDER — HYDROMORPHONE HCL 2 MG/ML IJ SOLN
2.0000 mg | INTRAMUSCULAR | Status: AC
  Administered 2019-05-09: 21:00:00 2 mg via INTRAVENOUS
  Filled 2019-05-09: qty 1

## 2019-05-09 NOTE — Discharge Instructions (Signed)
Your please call the sickle cell clinic tomorrow.  Follow-up with them for pain medication and continue care.

## 2019-05-09 NOTE — ED Triage Notes (Signed)
Per pt, states she has been having sickle cell pain for 2 days-complaining of back, leg and arm pain

## 2019-05-09 NOTE — ED Provider Notes (Addendum)
[facility] DEPT Provider Note   CSN: [id number] Arrival date & time: 05/09/19  1704     History   Chief Complaint Chief Complaint  Patient presents with  . Sickle Cell Pain Crisis    HPI Sandra Mckay is a 23 y.o. female   Patient received 2 days of worsening lower back, hips, leg pain is achy and cramping and constant.  Patient states that her last sickle cell crisis was 1.5 weeks ago she had a blood transfusion at that time.  States her normal hemoglobin runs 8-9 as best patient states that she was off hydroxyurea during her recent pregnancy and states that she has been back on hydroxyurea for the past week.  Patient states that this is usual presentation for sickle cell crisis.  States that she had transfusion 1.5 weeks ago due to low hemoglobin.  Patient states she has not vomited and has no diarrhea states she has been eating and drinking as normal.   Denies any fevers, chills, dizziness, chest pain, shortness of breath.     HPI  Past Medical History:  Diagnosis Date  . Blood transfusion without reported diagnosis   . Bronchitis   . Chickenpox   . Depression   . Heart murmur   . Pulmonary hypertension ([place])   . Sickle cell anemia (HCC)   . Urinary tract infection     Patient Active Problem List   Diagnosis Date Noted  . [redacted] weeks gestation of pregnancy 12/29/2018  . Medication management 12/12/2018  . Bipolar and related disorder ([place]) 04/04/2017  . Sickle cell pain crisis ([place]) 12/05/2016  . Sickle cell anemia with crisis ([place]) 12/05/2016  . Vasovagal syncope 09/18/2016  . Closed fracture of lumbar vertebra without spinal cord injury ([place]) 07/15/2016  . Nausea without vomiting 09/09/2015  . Vitamin D deficiency 07/22/2015  . Sickle cell anemia ([place]) 06/08/2015    Past Surgical History:  Procedure Laterality Date  . CHOLECYSTECTOMY  2011  . EYE SURGERY     Sty removal  . LABIAL ADHESION LYSIS  1999  . 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 TONSILLECTOMY  2012     OB History    Gravida  1   Para      Term      Preterm      AB      Living        SAB      TAB      Ectopic      Multiple      Live Births               Home Medications    Prior to Admission medications   Medication Sig Start Date End Date Taking? Authorizing Provider  doxylamine, Sleep, (UNISOM) 25 MG tablet Take 25 mg by mouth at bedtime as needed for sleep.    Yes [provider]  folic acid (FOLVITE) 1 MG tablet Take 1 tablet (1 mg total) by mouth daily. 03/23/18  Yes Azzie Glatter, FNP  ibuprofen (ADVIL,MOTRIN) 600 MG tablet Take 1 tablet (600 mg total) by mouth every 8 (eight) hours as needed. Patient taking differently: Take 600 mg by mouth every 8 (eight) hours as needed for moderate pain.  03/23/18  Yes Azzie Glatter, FNP  ondansetron (ZOFRAN) 4 MG tablet Take 1 tablet (4 mg total) by mouth every 8 (eight) hours as needed for nausea or vomiting. 03/23/18  Yes Azzie Glatter, FNP  oxyCODONE (ROXICODONE) 15 MG immediate release tablet Take 1 tablet (15 mg total) by mouth every 4 (four) hours as needed for up to 15 days for pain. 04/27/19 05/12/19 Yes Azzie Glatter, FNP  sertraline (ZOLOFT) 25 MG tablet Take 25 mg by mouth daily.   Yes [provider]  tiZANidine (ZANAFLEX) 4 MG capsule Take 1 capsule (4 mg total) by mouth 3 (three) times daily. 01/13/19  Yes Azzie Glatter, FNP  Vitamin D, Ergocalciferol, (DRISDOL) 50000 units CAPS capsule Take 1 capsule (50,000 Units total) by mouth every Saturday. Saturday. 03/26/18  Yes Azzie Glatter, FNP  megestrol (MEGACE) 20 MG tablet Take 1 tablet (20 mg total) by mouth daily. Patient not taking: Reported on 08/23/2018 03/23/18   Azzie Glatter, FNP  Prenatal Vit-Fe Fumarate-FA (MULTIVITAMIN-PRENATAL) 27-0.8 MG TABS tablet Take 1 tablet by mouth daily at 12 noon.    [provider]    Family  History Family History  Problem Relation Age of Onset  . Arthritis Other        grandparent  . Stroke Other   . Hypertension Other   . Diabetes Other        grandparent  . Cancer - Other Other        Glioblastoma    Social History Social History   Tobacco Use  . Smoking status: Former Research scientist (life sciences)  . Smokeless tobacco: Never Used  Substance Use Topics  . Alcohol use: Yes    Alcohol/week: 0.0 standard drinks    Comment: occ  . Drug use: No     Allergies   Latex   Review of Systems Review of Systems  All other systems reviewed and are negative.    Physical Exam Updated Vital Signs BP 112/69   Pulse 76   Temp 98 F (36.7 C) (Oral)   Resp 14   LMP 04/25/2019   SpO2 97%   Physical Exam Vitals signs and nursing note reviewed.  Constitutional:      Appearance: She is not ill-appearing.     Comments: Patient is uncomfortable.  HENT:     Head: Normocephalic and atraumatic.     Nose: Nose normal.  Eyes:     General: No scleral icterus. Neck:     Musculoskeletal: Normal range of motion.  Cardiovascular:     Rate and Rhythm: Normal rate and regular rhythm.     Pulses: Normal pulses.     Heart sounds: Normal heart sounds.  Pulmonary:     Effort: Pulmonary effort is normal. No respiratory distress.     Breath sounds: No wheezing.  Abdominal:     Palpations: Abdomen is soft.     Tenderness: There is no abdominal tenderness.  Musculoskeletal:     Right lower leg: No edema.     Left lower leg: No edema.     Comments: tenderness over bilateral hips and knees.  Skin:    General: Skin is warm and dry.     Capillary Refill: Capillary refill takes less than 2 seconds.  Neurological:     Mental Status: She is alert. Mental status is at baseline.  Psychiatric:        Mood and Affect: Mood normal.        Behavior: Behavior normal.      ED Treatments / Results  Labs (all labs ordered are listed, but only abnormal results are displayed) Labs Reviewed   COMPREHENSIVE METABOLIC PANEL - Abnormal; Notable for the  following components:      Result Value   Total Bilirubin 2.2 (*)    All other components within normal limits  CBC WITH DIFFERENTIAL/PLATELET - Abnormal; Notable for the following components:   WBC 12.6 (*)    RBC 2.32 (*)    Hemoglobin 7.9 (*)    HCT 23.2 (*)    MCH 34.1 (*)    RDW 19.4 (*)    Platelets 565 (*)    nRBC 0.4 (*)    Monocytes Absolute 2.2 (*)    All other components within normal limits  RETICULOCYTES - Abnormal; Notable for the following components:   Retic Ct Pct 17.4 (*)    RBC. 2.32 (*)    Retic Count, Absolute 403.7 (*)    Immature Retic Fract 27.8 (*)    All other components within normal limits  I-STAT BETA HCG BLOOD, ED ([facility], [facility], [facility] ONLY)    EKG None  Radiology No results found.  Procedures Procedures (including critical care time)  Medications Ordered in ED Medications  diphenhydrAMINE (BENADRYL) capsule 25-50 mg (has no administration in time range)  HYDROmorphone (DILAUDID) injection 2 mg (2 mg Intravenous Given 05/09/19 2029)    Or  HYDROmorphone (DILAUDID) injection 2 mg ( Subcutaneous See Alternative 05/09/19 2029)  HYDROmorphone (DILAUDID) injection 2 mg (2 mg Intravenous Given 05/09/19 2100)    Or  HYDROmorphone (DILAUDID) injection 2 mg ( Subcutaneous See Alternative 05/09/19 2100)  HYDROmorphone (DILAUDID) injection 2 mg (2 mg Intravenous Given 05/09/19 2204)    Or  HYDROmorphone (DILAUDID) injection 2 mg ( Subcutaneous See Alternative 05/09/19 2204)  ondansetron (ZOFRAN) injection 4 mg (4 mg Intravenous Given 05/09/19 2028)     Initial Impression / Assessment and Plan / ED Course  I have reviewed the triage vital signs and the nursing notes.  Pertinent labs & imaging results that were available during my care of the patient were reviewed by me and considered in my medical decision making (see chart for details).       Patient is 23 year old female presenting for sickle cell  crisis similar to prior sickle cell crises with pain in legs and arms.  Patient is not pregnant, elevated reticulocyte count 17.4, less than last sickle cell crisis.  Gl[place]in is 7.9 however this is not abnormal for patient patient states that she had confusion 1.5 weeks ago.  Prior hemoglobin levels are in the 6 and 7 range.  Vitals within normal limits during ED visit.    Patient received 3 doses of Dilaudid and Zofran.  Patient feels well this time and is able to tolerate p.o.  No nausea and pain is improved.  Patient states she will follow-up with physical clinic and is scheduled to see them on Friday.  Will call tomorrow morning to see if she can get a refill on her oxycodone sooner.  She states she will hydrate tonight.   Given return precautions. Patient understands plan.     The patient appears reasonably screened and/or stabilized for discharge and I doubt any other medical condition or other [facility] requiring further screening, evaluation, or treatment in the ED at this time prior to discharge.  Patient is hemodynamically stable, in NAD, and able to ambulate in the ED. Pain has been managed or a plan has been made for home management and has no complaints prior to discharge. Patient is comfortable with above plan and is stable for discharge at this time. All questions were answered prior to disposition. Results from the ER workup  discussed with the patient face to face and all questions answered to the best of my ability. The patient is safe for discharge with strict return precautions. Patient appears safe for discharge with appropriate follow-up.  Conveyed my impression with the patient and he voiced understanding and is agreeable to plan.   An After Visit Summary was printed and given to the patient.  Portions of this note were generated with [employer]. Dictation errors may occur despite best attempts at proofreading.    Final Clinical Impressions(s) / ED Diagnoses    Final diagnoses:  Sickle cell pain crisis [facility])    ED Discharge Orders    None       Tedd Sias, [place] 05/09/19 2309    Tedd Sias, [place] 05/09/19 2311    Drenda Freeze, MD 05/09/19 [phone]

## 2019-05-10 ENCOUNTER — Other Ambulatory Visit: Payer: Self-pay | Admitting: Family Medicine

## 2019-05-10 ENCOUNTER — Telehealth: Payer: Self-pay | Admitting: Family Medicine

## 2019-05-10 DIAGNOSIS — D571 Sickle-cell disease without crisis: Secondary | ICD-10-CM

## 2019-05-10 DIAGNOSIS — G894 Chronic pain syndrome: Secondary | ICD-10-CM

## 2019-05-10 DIAGNOSIS — Z79891 Long term (current) use of opiate analgesic: Secondary | ICD-10-CM

## 2019-05-10 MED ORDER — OXYCODONE HCL 15 MG PO TABS: 15.0000 mg | ORAL_TABLET | ORAL | 0 refills | Status: DC | PRN

## 2019-05-10 NOTE — Telephone Encounter (Signed)
Patient aware of Rx.  

## 2019-05-17 ENCOUNTER — Ambulatory Visit (INDEPENDENT_AMBULATORY_CARE_PROVIDER_SITE_OTHER): Payer: Medicaid Other | Admitting: Family Medicine

## 2019-05-17 ENCOUNTER — Encounter: Payer: Self-pay | Admitting: Family Medicine

## 2019-05-17 ENCOUNTER — Other Ambulatory Visit: Payer: Self-pay

## 2019-05-17 VITALS — BP 107/59 | HR 85 | Temp 98.1°F | Ht 63.0 in | Wt 128.6 lb

## 2019-05-17 DIAGNOSIS — D571 Sickle-cell disease without crisis: Secondary | ICD-10-CM | POA: Diagnosis not present

## 2019-05-17 DIAGNOSIS — Z09 Encounter for follow-up examination after completed treatment for conditions other than malignant neoplasm: Secondary | ICD-10-CM | POA: Diagnosis not present

## 2019-05-17 DIAGNOSIS — G894 Chronic pain syndrome: Secondary | ICD-10-CM

## 2019-05-17 DIAGNOSIS — D5700 Hb-SS disease with crisis, unspecified: Secondary | ICD-10-CM

## 2019-05-17 DIAGNOSIS — Z79891 Long term (current) use of opiate analgesic: Secondary | ICD-10-CM

## 2019-05-17 LAB — POCT URINALYSIS DIPSTICK
Glucose, UA: NEGATIVE
Ketones, UA: NEGATIVE
Leukocytes, UA: NEGATIVE
Nitrite, UA: NEGATIVE
Protein, UA: NEGATIVE
Spec Grav, UA: 1.02
Urobilinogen, UA: 1 U/dL
pH, UA: 6

## 2019-05-17 NOTE — Progress Notes (Signed)
Patient [facility] Follow Up  Subjective:  Patient ID: Sandra Mckay, female    DOB: [birth year]  Age: 23 y.o. MRN: [id number]  CC:  Chief Complaint  Patient presents with  . Hospitalization Follow-up    fatigue, sickle cell    HPI Sandra Mckay is a 23 year old female who presents for Hospital Follow Up today.  Past Medical History:  Diagnosis Date  . Blood transfusion without reported diagnosis   . Bronchitis   . Chickenpox   . Depression   . Heart murmur   . Pulmonary hypertension ([place])   . Sickle cell anemia (HCC)   . Urinary tract infection    Current Status: Since her last office visit, she is doing well with no complaints. She states that her has pain in her hips, legs and lower back. She rates her pain today at 6/10. She has not had a hospital visit for Sickle Cell Crisis since 05/09/2019 where she was treated and discharged the same day. She is currently taking all medications as prescribed and staying well hydrated. She reports occasional nausea, constipation, dizziness and headaches. She was last hospitalized on 04/22/2019 for crisis, where she received 1 unit of PRBCs last time she was in hospital.  She denies fevers, chills, fatigue, recent infections, weight loss, and night sweats. She has not had any headaches, visual changes, dizziness, and falls. No chest pain, heart palpitations, cough and shortness of breath reported. No reports of GI problems such as nausea, vomiting, diarrhea, and constipation. She has no reports of blood in stools, dysuria and hematuria. Her anxiety is mild today. She denies suicidal ideations, homicidal ideations, or auditory hallucinations.   Past Surgical History:  Procedure Laterality Date  . CHOLECYSTECTOMY  2011  . EYE SURGERY     Sty removal  . LABIAL ADHESION LYSIS  1999  . 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 TONSILLECTOMY  2012     Family History  Problem Relation Age of Onset  . Arthritis Other        grandparent  . Stroke Other   . Hypertension Other   . Diabetes Other        grandparent  . Cancer - Other Other        Glioblastoma    Social History   Socioeconomic History  . Marital status: Single    Spouse name: Not on file  . Number of children: Not on file  . Years of education: Not on file  . Highest education level: Not on file  Occupational History  . Not on file  Social Needs  . Financial resource strain: Not on file  . Food insecurity    Worry: Not on file    Inability: Not on file  . Transportation needs    Medical: Not on file    Non-medical: Not on file  Tobacco Use  . Smoking status: Former Research scientist (life sciences)  . Smokeless tobacco: Never Used  Substance and Sexual Activity  . Alcohol use: Yes    Alcohol/week: 0.0 standard drinks    Comment: occ  . Drug use: No  . Sexual activity: Yes    Birth control/protection: Implant  Lifestyle  . Physical activity    Days per week: Not on file    Minutes per session: Not on file  . Stress: Not on file  Relationships  . Social Herbalist on phone:  Not on file    Gets together: Not on file    Attends religious service: Not on file    Active member of club or organization: Not on file    Attends meetings of clubs or organizations: Not on file    Relationship status: Not on file  . Intimate partner violence    Fear of current or ex partner: Not on file    Emotionally abused: Not on file    Physically abused: Not on file    Forced sexual activity: Not on file  Other Topics Concern  . Not on file  Social History Narrative   Lives with mom in a one story home.  No children.  Currently not working.  Education: 2 years of college.     Outpatient Medications Prior to Visit  Medication Sig Dispense Refill  . doxylamine, Sleep, (UNISOM) 25 MG tablet Take 25 mg by mouth at bedtime as needed for sleep.     . folic acid (FOLVITE) 1 MG tablet  Take 1 tablet (1 mg total) by mouth daily. 90 tablet 3  . ibuprofen (ADVIL,MOTRIN) 600 MG tablet Take 1 tablet (600 mg total) by mouth every 8 (eight) hours as needed. (Patient taking differently: Take 600 mg by mouth every 8 (eight) hours as needed for moderate pain. ) 30 tablet 3  . megestrol (MEGACE) 20 MG tablet Take 1 tablet (20 mg total) by mouth daily. (Patient not taking: Reported on 08/23/2018) 30 tablet 1  . ondansetron (ZOFRAN) 4 MG tablet Take 1 tablet (4 mg total) by mouth every 8 (eight) hours as needed for nausea or vomiting. 20 tablet 5  . oxyCODONE (ROXICODONE) 15 MG immediate release tablet Take 1 tablet (15 mg total) by mouth every 4 (four) hours as needed for up to 15 days for pain. 90 tablet 0  . Prenatal Vit-Fe Fumarate-FA (MULTIVITAMIN-PRENATAL) 27-0.8 MG TABS tablet Take 1 tablet by mouth daily at 12 noon.    . sertraline (ZOLOFT) 25 MG tablet Take 25 mg by mouth daily.    Marland Kitchen tiZANidine (ZANAFLEX) 4 MG capsule Take 1 capsule (4 mg total) by mouth 3 (three) times daily. 60 capsule 1  . Vitamin D, Ergocalciferol, (DRISDOL) 50000 units CAPS capsule Take 1 capsule (50,000 Units total) by mouth every Saturday. Saturday. 30 capsule 5   No facility-administered medications prior to visit.     Allergies  Allergen Reactions  . Latex Rash    ROS Review of Systems  Constitutional: Positive for fatigue.  HENT: Negative.   Eyes: Negative.   Respiratory: Positive for shortness of breath (occasional ).   Cardiovascular: Negative.   Gastrointestinal: Negative.   Endocrine: Negative.   Genitourinary: Negative.   Musculoskeletal: Positive for arthralgias (generalized joint pain).  Skin: Negative.   Allergic/Immunologic: Negative.   Neurological: Positive for dizziness (occasional ), weakness and headaches (occassional ).  Psychiatric/Behavioral: Negative.       Objective:    Physical Exam  Constitutional: She is oriented to person, place, and time. She appears  well-developed and well-nourished.  HENT:  Head: Normocephalic and atraumatic.  Eyes: Conjunctivae are normal.  Neck: Normal range of motion. Neck supple.  Cardiovascular: Normal rate, regular rhythm, normal heart sounds and intact distal pulses.  Pulmonary/Chest: Effort normal and breath sounds normal.  Abdominal: Soft. Bowel sounds are normal.  Musculoskeletal: Normal range of motion.  Neurological: She is alert and oriented to person, place, and time. She has normal reflexes.  Skin: Skin is warm and dry.  Psychiatric: She has a normal mood and affect. Her behavior is normal. Judgment and thought content normal.  Nursing note and vitals reviewed.   BP (!) 107/59 (BP Location: Left Arm, Patient Position: Sitting, Cuff Size: Normal)   Pulse 85   Temp 98.1 F (36.7 C)   Ht 5\' 3"  (1.6 m)   Wt 128 lb 9.6 oz (58.3 kg)   LMP 05/17/2019   SpO2 95%   BMI 22.78 kg/m  Wt Readings from Last 3 Encounters:  05/17/19 128 lb 9.6 oz (58.3 kg)  09/27/18 136 lb 6.4 oz (61.9 kg)  08/23/18 134 lb 9.6 oz (61.1 kg)     Health Maintenance Due  Topic Date Due  . PAP-Cervical Cytology Screening  09/01/2014  . PAP SMEAR-Modifier  09/01/2017    There are no preventive care reminders to display for this patient.  No results found for: TSH Lab Results  Component Value Date   WBC 11.8 (H) 05/17/2019   HGB 7.0 (LL) 05/17/2019   HCT 20.9 (L) 05/17/2019   MCV 103 (H) 05/17/2019   PLT 487 (H) 05/17/2019   Lab Results  Component Value Date   NA 142 05/17/2019   K 4.5 05/17/2019   CO2 21 05/17/2019   GLUCOSE 92 05/17/2019   BUN 6 05/17/2019   CREATININE 0.53 (L) 05/17/2019   BILITOT 2.4 (H) 05/17/2019   ALKPHOS 113 05/17/2019   AST 33 05/17/2019   ALT 22 05/17/2019   PROT 6.6 05/17/2019   ALBUMIN 4.3 05/17/2019   CALCIUM 9.4 05/17/2019   ANIONGAP 7 05/09/2019   No results found for: CHOL No results found for: HDL No results found for: LDLCALC No results found for: TRIG No results  found for: CHOLHDL No results found for: HGBA1C    Assessment & Plan:   1. Hospital discharge follow-up  2. Hb-SS disease with crisis ([place]) Stable today..  - POCT urinalysis dipstick - Hemoglobinopathy evaluation [id number] 11+Oxyco+Alc+Crt-Bund  3. Hb-SS disease without crisis [facility]) She is doing well today. She will continue to take pain medications as prescribed; will continue to avoid extreme heat and cold; will continue to eat a healthy diet and drink at least 64 ounces of water daily; continue stool softener as needed; will avoid colds and flu; will continue to get plenty of sleep and rest; will continue to avoid high stressful situations and remain infection free; will continue Folic Acid 1 mg daily to avoid sickle cell crisis. Continue to follow up with Hematologist as needed. Her baby is approximately 56 weeks old and doing well.  - Sickle Cell Panel  4. Chronic prescription opiate use  5. Chronic pain syndrome  6. Follow up She will follow up in 2 months.   No orders of the defined types were placed in this encounter.   Orders Placed This Encounter  Procedures  . Hemoglobinopathy evaluation  . [id number] 11+Oxyco+Alc+Crt-Bund  . Sickle Cell Panel  . POCT urinalysis dipstick    Referral Orders  No referral(s) requested today    [name],  MSN, FNP-BC [place], [place] [address] [phone]- fax   Problem List Items Addressed This Visit      Other   Sickle cell anemia ([place])   Relevant Orders   Sickle Cell Panel (Completed)   POCT urinalysis dipstick (Completed)   Hemoglobinopathy evaluation (Completed)   [id number] 11+Oxyco+Alc+Crt-Bund (Completed)    Other Visit Diagnoses  Hospital discharge follow-up    -  Primary   Chronic prescription opiate use       Chronic pain syndrome       Follow up          No orders of the defined types were  placed in this encounter.   Follow-up: Return in about 2 months (around 07/17/2019).    Azzie Glatter, FNP

## 2019-05-22 LAB — CMP14+CBC/D/PLT+FER+RETIC+V...
ALT: 22 IU/L (ref 0–32)
AST: 33 IU/L (ref 0–40)
Albumin/Globulin Ratio: 1.9 (ref 1.2–2.2)
Albumin: 4.3 g/dL (ref 3.9–5.0)
Alkaline Phosphatase: 113 IU/L (ref 39–117)
BUN/Creatinine Ratio: 11 (ref 9–23)
BUN: 6 mg/dL (ref 6–20)
Basophils Absolute: 0.1 10*3/uL (ref 0.0–0.2)
Basos: 1 %
Bilirubin Total: 2.4 mg/dL — ABNORMAL HIGH (ref 0.0–1.2)
CO2: 21 mmol/L (ref 20–29)
Calcium: 9.4 mg/dL (ref 8.7–10.2)
Chloride: 109 mmol/L — ABNORMAL HIGH (ref 96–106)
Creatinine, Ser: 0.53 mg/dL — ABNORMAL LOW (ref 0.57–1.00)
EOS (ABSOLUTE): 0.1 10*3/uL (ref 0.0–0.4)
Eos: 1 %
Ferritin: 1276 ng/mL — ABNORMAL HIGH (ref 15–150)
GFR calc Af Amer: 153 mL/min/{1.73_m2} (ref >59)
GFR calc non Af Amer: 132 mL/min/{1.73_m2} (ref >59)
Globulin, Total: 2.3 g/dL (ref 1.5–4.5)
Glucose: 92 mg/dL (ref 65–99)
Hematocrit: 20.9 % — ABNORMAL LOW (ref 34.0–46.6)
Hemoglobin: 7 g/dL — CL (ref 11.1–15.9)
Immature Grans (Abs): 0.1 10*3/uL (ref 0.0–0.1)
Immature Granulocytes: 1 %
Lymphocytes Absolute: 4.1 10*3/uL — ABNORMAL HIGH (ref 0.7–3.1)
Lymphs: 35 %
MCH: 34.5 pg — ABNORMAL HIGH (ref 26.6–33.0)
MCHC: 33.5 g/dL (ref 31.5–35.7)
MCV: 103 fL — ABNORMAL HIGH (ref 79–97)
Monocytes Absolute: 1.9 10*3/uL — ABNORMAL HIGH (ref 0.1–0.9)
Monocytes: 16 %
NRBC: 1 % — ABNORMAL HIGH (ref 0–0)
Neutrophils Absolute: 5.6 10*3/uL (ref 1.4–7.0)
Neutrophils: 46 %
Platelets: 487 10*3/uL — ABNORMAL HIGH (ref 150–450)
Potassium: 4.5 mmol/L (ref 3.5–5.2)
RBC: 2.03 x10E6/uL — CL (ref 3.77–5.28)
RDW: 23.5 % — ABNORMAL HIGH (ref 11.7–15.4)
Retic Ct Pct: 20.1 % — ABNORMAL HIGH (ref 0.6–2.6)
Sodium: 142 mmol/L (ref 134–144)
Total Protein: 6.6 g/dL (ref 6.0–8.5)
Vit D, 25-Hydroxy: 8.6 ng/mL — ABNORMAL LOW (ref 30.0–100.0)
WBC: 11.8 10*3/uL — ABNORMAL HIGH (ref 3.4–10.8)

## 2019-05-22 LAB — HEMOGLOBINOPATHY EVALUATION
HGB C: 0 %
HGB S: 58.1 % — ABNORMAL HIGH
HGB VARIANT: 0 %
Hemoglobin A2 Quantitation: 3.8 % — ABNORMAL HIGH (ref 1.8–3.2)
Hemoglobin F Quantitation: 6.6 % — ABNORMAL HIGH (ref 0.0–2.0)
Hgb A: 31.5 % — ABNORMAL LOW (ref 96.4–98.8)

## 2019-05-24 LAB — DRUG SCREEN 764883 11+OXYCO+ALC+CRT-BUND
Amphetamines, Urine: NEGATIVE ng/mL
BENZODIAZ UR QL: NEGATIVE ng/mL
Barbiturate: NEGATIVE ng/mL
Cocaine (Metabolite): NEGATIVE ng/mL
Creatinine: 129.4 mg/dL (ref 20.0–300.0)
Ethanol: NEGATIVE %
Meperidine: NEGATIVE ng/mL
Methadone Screen, Urine: NEGATIVE ng/mL
Phencyclidine: NEGATIVE ng/mL
Propoxyphene: NEGATIVE ng/mL
Tramadol: NEGATIVE ng/mL
pH, Urine: 5.4 (ref 4.5–8.9)

## 2019-05-24 LAB — OXYCODONE/OXYMORPHONE, CONFIRM
OXYCODONE/OXYMORPH: POSITIVE — AB
OXYCODONE: 1858 ng/mL
OXYCODONE: POSITIVE — AB
OXYMORPHONE (GC/MS): 1760 ng/mL
OXYMORPHONE: POSITIVE — AB

## 2019-05-24 LAB — CANNABINOID CONFIRMATION, UR
CANNABINOIDS: POSITIVE — AB
Carboxy THC GC/MS Conf: >300 ng/mL

## 2019-05-24 LAB — OPIATES CONFIRMATION, URINE: Opiates: NEGATIVE ng/mL

## 2019-05-25 ENCOUNTER — Other Ambulatory Visit: Payer: Self-pay | Admitting: Family Medicine

## 2019-05-25 ENCOUNTER — Telehealth: Payer: Self-pay | Admitting: Family Medicine

## 2019-05-25 DIAGNOSIS — D571 Sickle-cell disease without crisis: Secondary | ICD-10-CM

## 2019-05-25 DIAGNOSIS — G894 Chronic pain syndrome: Secondary | ICD-10-CM

## 2019-05-25 DIAGNOSIS — Z79891 Long term (current) use of opiate analgesic: Secondary | ICD-10-CM

## 2019-05-25 MED ORDER — OXYCODONE HCL 15 MG PO TABS: 15.0000 mg | ORAL_TABLET | ORAL | 0 refills | Status: DC | PRN

## 2019-05-25 NOTE — Telephone Encounter (Signed)
done

## 2019-05-30 ENCOUNTER — Other Ambulatory Visit: Payer: Self-pay | Admitting: Family Medicine

## 2019-05-30 ENCOUNTER — Encounter: Payer: Self-pay | Admitting: Family Medicine

## 2019-05-30 DIAGNOSIS — D649 Anemia, unspecified: Secondary | ICD-10-CM

## 2019-05-30 DIAGNOSIS — E559 Vitamin D deficiency, unspecified: Secondary | ICD-10-CM

## 2019-05-30 MED ORDER — VITAMIN D (ERGOCALCIFEROL) 1.25 MG (50000 UNIT) PO CAPS: 50000.0000 [IU] | ORAL_CAPSULE | ORAL | 6 refills | Status: DC

## 2019-06-09 ENCOUNTER — Telehealth: Payer: Self-pay | Admitting: Family Medicine

## 2019-06-09 NOTE — Telephone Encounter (Signed)
Called and spoke with patient. She states she has had a blood transfusion with [facility] approximently 1 week ago. And she will come in next Friday for repeat CBC.

## 2019-06-10 ENCOUNTER — Other Ambulatory Visit: Payer: Self-pay | Admitting: Family Medicine

## 2019-06-10 DIAGNOSIS — Z79891 Long term (current) use of opiate analgesic: Secondary | ICD-10-CM

## 2019-06-10 DIAGNOSIS — G894 Chronic pain syndrome: Secondary | ICD-10-CM

## 2019-06-10 DIAGNOSIS — D571 Sickle-cell disease without crisis: Secondary | ICD-10-CM

## 2019-06-10 MED ORDER — OXYCODONE HCL 15 MG PO TABS: 15.0000 mg | ORAL_TABLET | ORAL | 0 refills | Status: DC | PRN

## 2019-06-12 NOTE — Telephone Encounter (Signed)
Patient informed. 

## 2019-06-16 ENCOUNTER — Other Ambulatory Visit: Payer: Medicaid Other

## 2019-06-20 ENCOUNTER — Other Ambulatory Visit: Payer: Self-pay | Admitting: Family Medicine

## 2019-06-20 DIAGNOSIS — D571 Sickle-cell disease without crisis: Secondary | ICD-10-CM

## 2019-06-20 DIAGNOSIS — D649 Anemia, unspecified: Secondary | ICD-10-CM

## 2019-06-22 ENCOUNTER — Telehealth: Payer: Self-pay | Admitting: Family Medicine

## 2019-06-22 ENCOUNTER — Other Ambulatory Visit: Payer: Self-pay | Admitting: Family Medicine

## 2019-06-22 DIAGNOSIS — Z79891 Long term (current) use of opiate analgesic: Secondary | ICD-10-CM

## 2019-06-22 DIAGNOSIS — G894 Chronic pain syndrome: Secondary | ICD-10-CM

## 2019-06-22 DIAGNOSIS — D571 Sickle-cell disease without crisis: Secondary | ICD-10-CM

## 2019-06-22 MED ORDER — OXYCODONE HCL 15 MG PO TABS: 15.0000 mg | ORAL_TABLET | ORAL | 0 refills | Status: DC | PRN

## 2019-06-22 NOTE — Telephone Encounter (Signed)
done

## 2019-07-07 ENCOUNTER — Ambulatory Visit: Payer: Medicaid Other | Admitting: Family Medicine

## 2019-07-08 MED ORDER — BARO-CAT PO
10.00 | ORAL | Status: DC
Start: ? — End: 2019-07-08

## 2019-07-08 MED ORDER — Medication
Status: DC
Start: ? — End: 2019-07-08

## 2019-07-10 ENCOUNTER — Telehealth: Payer: Self-pay | Admitting: Family Medicine

## 2019-07-10 ENCOUNTER — Other Ambulatory Visit: Payer: Self-pay | Admitting: Family Medicine

## 2019-07-10 DIAGNOSIS — Z79891 Long term (current) use of opiate analgesic: Secondary | ICD-10-CM

## 2019-07-10 DIAGNOSIS — G894 Chronic pain syndrome: Secondary | ICD-10-CM

## 2019-07-10 DIAGNOSIS — D571 Sickle-cell disease without crisis: Secondary | ICD-10-CM

## 2019-07-10 MED ORDER — OXYCODONE HCL 15 MG PO TABS: 15.0000 mg | ORAL_TABLET | ORAL | 0 refills | Status: DC | PRN

## 2019-07-10 NOTE — Telephone Encounter (Signed)
Called and spoke with patient, advised that rx has been sent to pharmacy. Thanks!

## 2019-07-21 ENCOUNTER — Ambulatory Visit: Payer: Medicaid Other | Admitting: Family Medicine

## 2019-07-24 MED ORDER — GENERIC EXTERNAL MEDICATION
Status: DC
Start: ? — End: 2019-07-24

## 2019-07-24 MED ORDER — SODIUM CHLORIDE 0.9 % IV SOLN
10.00 | INTRAVENOUS | Status: DC
Start: ? — End: 2019-07-24

## 2019-07-25 ENCOUNTER — Other Ambulatory Visit: Payer: Self-pay | Admitting: Family Medicine

## 2019-07-25 ENCOUNTER — Telehealth: Payer: Self-pay | Admitting: Family Medicine

## 2019-07-25 DIAGNOSIS — G894 Chronic pain syndrome: Secondary | ICD-10-CM

## 2019-07-25 DIAGNOSIS — D571 Sickle-cell disease without crisis: Secondary | ICD-10-CM

## 2019-07-25 DIAGNOSIS — Z79891 Long term (current) use of opiate analgesic: Secondary | ICD-10-CM

## 2019-07-25 MED ORDER — OXYCODONE HCL 15 MG PO TABS: 15.0000 mg | ORAL_TABLET | ORAL | 0 refills | Status: DC | PRN

## 2019-07-25 NOTE — Telephone Encounter (Signed)
done

## 2019-08-08 ENCOUNTER — Telehealth: Payer: Self-pay | Admitting: Internal Medicine

## 2019-08-08 MED ORDER — SODIUM CHLORIDE 0.9 % IV SOLN
10.00 | INTRAVENOUS | Status: DC
Start: ? — End: 2019-08-08

## 2019-08-08 MED ORDER — GENERIC EXTERNAL MEDICATION
Status: DC
Start: ? — End: 2019-08-08

## 2019-08-09 ENCOUNTER — Other Ambulatory Visit: Payer: Self-pay | Admitting: Family Medicine

## 2019-08-09 DIAGNOSIS — G894 Chronic pain syndrome: Secondary | ICD-10-CM

## 2019-08-09 DIAGNOSIS — Z79891 Long term (current) use of opiate analgesic: Secondary | ICD-10-CM

## 2019-08-09 DIAGNOSIS — D571 Sickle-cell disease without crisis: Secondary | ICD-10-CM

## 2019-08-09 MED ORDER — OXYCODONE HCL 15 MG PO TABS: 15.0000 mg | ORAL_TABLET | ORAL | 0 refills | Status: DC | PRN

## 2019-08-10 ENCOUNTER — Other Ambulatory Visit: Payer: Self-pay | Admitting: Family Medicine

## 2019-08-10 ENCOUNTER — Telehealth: Payer: Self-pay | Admitting: Family Medicine

## 2019-08-10 DIAGNOSIS — D571 Sickle-cell disease without crisis: Secondary | ICD-10-CM

## 2019-08-10 MED ORDER — HYDROXYUREA 500 MG PO CAPS: ORAL_CAPSULE | ORAL | 6 refills | Status: DC

## 2019-08-10 NOTE — Telephone Encounter (Signed)
Patient has old Hydroxurea at home. She is requesting a new Rx to be sent to the pharmacy. It has not been refilled in a long time. Patient may need labs please review.

## 2019-08-10 NOTE — Telephone Encounter (Signed)
Patient aware.

## 2019-08-11 NOTE — Telephone Encounter (Signed)
Done

## 2019-08-15 ENCOUNTER — Telehealth: Payer: Self-pay | Admitting: Internal Medicine

## 2019-08-15 ENCOUNTER — Ambulatory Visit: Payer: Medicaid Other | Admitting: Family Medicine

## 2019-08-15 ENCOUNTER — Encounter: Payer: Self-pay | Admitting: Family Medicine

## 2019-08-15 ENCOUNTER — Ambulatory Visit (INDEPENDENT_AMBULATORY_CARE_PROVIDER_SITE_OTHER): Payer: Medicaid Other | Admitting: Family Medicine

## 2019-08-15 ENCOUNTER — Other Ambulatory Visit: Payer: Self-pay

## 2019-08-15 DIAGNOSIS — D571 Sickle-cell disease without crisis: Secondary | ICD-10-CM

## 2019-08-15 NOTE — Telephone Encounter (Signed)
Pt called back for he TeleVisit I notified the provider. The provider stated she did not have time to call her back

## 2019-08-15 NOTE — Progress Notes (Signed)
Unable to reach patient for Telephone Virtual Visit on 08/15/2019.

## 2019-08-18 ENCOUNTER — Encounter: Payer: Self-pay | Admitting: Family Medicine

## 2019-08-18 ENCOUNTER — Ambulatory Visit (INDEPENDENT_AMBULATORY_CARE_PROVIDER_SITE_OTHER): Payer: Medicaid Other | Admitting: Family Medicine

## 2019-08-18 ENCOUNTER — Other Ambulatory Visit: Payer: Self-pay

## 2019-08-18 DIAGNOSIS — Z79891 Long term (current) use of opiate analgesic: Secondary | ICD-10-CM | POA: Diagnosis not present

## 2019-08-18 DIAGNOSIS — D571 Sickle-cell disease without crisis: Secondary | ICD-10-CM

## 2019-08-18 DIAGNOSIS — Z09 Encounter for follow-up examination after completed treatment for conditions other than malignant neoplasm: Secondary | ICD-10-CM

## 2019-08-18 DIAGNOSIS — D649 Anemia, unspecified: Secondary | ICD-10-CM

## 2019-08-18 DIAGNOSIS — D5700 Hb-SS disease with crisis, unspecified: Secondary | ICD-10-CM | POA: Diagnosis not present

## 2019-08-18 DIAGNOSIS — G894 Chronic pain syndrome: Secondary | ICD-10-CM

## 2019-08-18 NOTE — Progress Notes (Signed)
Virtual Visit via Telephone Note  I connected with Sandra Mckay on 08/18/19 at  3:20 PM EST by telephone and verified that I am speaking with the correct person using two identifiers.   I discussed the limitations, risks, security and privacy concerns of performing an evaluation and management service by telephone and the availability of in person appointments. I also discussed with the patient that there may be a patient responsible charge related to this service. The patient expressed understanding and agreed to proceed.   History of Present Illness: Past Medical History:  Diagnosis Date  . Blood transfusion without reported diagnosis   . Bronchitis   . Chickenpox   . Depression   . Elevated ferritin level 05/2019  . Heart murmur   . Pulmonary hypertension ([place])   . Sickle cell anemia (HCC)   . 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 Thrombocytosis ([place]) 05/2019  . Urinary tract infection   . Vitamin D deficiency     Social History   Tobacco Use  . Smoking status: Former Research scientist (life sciences)  . Smokeless tobacco: Never Used  Substance Use Topics  . Alcohol use: Yes    Alcohol/week: 0.0 standard drinks    Comment: occ  . Drug use: No   Past Surgical History:  Procedure Laterality Date  . CHOLECYSTECTOMY  2011  . EYE SURGERY     Sty removal  . LABIAL ADHESION LYSIS  1999  . 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 TONSILLECTOMY  2012    Family History  Problem Relation Age of Onset  . Arthritis Other        grandparent  . Stroke Other   . Hypertension Other   . Diabetes Other        grandparent  . Cancer - Other Other        Glioblastoma    Allergies  Allergen Reactions  . Latex Rash    Current Outpatient Medications on File Prior to Visit  Medication Sig Dispense Refill  . doxylamine, Sleep, (UNISOM) 25 MG tablet Take 25 mg by mouth at bedtime as needed for sleep.     . folic acid (FOLVITE) 1 MG tablet Take 1 tablet (1 mg total)  by mouth daily. 90 tablet 3  . hydroxyurea (HYDREA) 500 MG capsule TAKE 3 CAPSULES (1,500 MG TOTAL) BY MOUTH DAILY. 90 capsule 6  . ibuprofen (ADVIL,MOTRIN) 600 MG tablet Take 1 tablet (600 mg total) by mouth every 8 (eight) hours as needed. (Patient taking differently: Take 600 mg by mouth every 8 (eight) hours as needed for moderate pain. ) 30 tablet 3  . megestrol (MEGACE) 20 MG tablet Take 1 tablet (20 mg total) by mouth daily. 30 tablet 1  . ondansetron (ZOFRAN) 4 MG tablet Take 1 tablet (4 mg total) by mouth every 8 (eight) hours as needed for nausea or vomiting. 20 tablet 5  . oxyCODONE (ROXICODONE) 15 MG immediate release tablet Take 1 tablet (15 mg total) by mouth every 4 (four) hours as needed for up to 15 days for pain. 90 tablet 0  . Prenatal Vit-Fe Fumarate-FA (MULTIVITAMIN-PRENATAL) 27-0.8 MG TABS tablet Take 1 tablet by mouth daily at 12 noon.    . sertraline (ZOLOFT) 25 MG tablet Take 25 mg by mouth daily.    Marland Kitchen tiZANidine (ZANAFLEX) 4 MG capsule Take 1 capsule (4 mg total) by mouth 3 (three) times daily. 60 capsule 1  . Vitamin D, Ergocalciferol, (DRISDOL)  1.25 MG (50000 UT) CAPS capsule Take 1 capsule (50,000 Units total) by mouth every Saturday. Saturday. 5 capsule 6   No current facility-administered medications on file prior to visit.    Current Status: Since her last office visit, she is doing well with no complaints. She states that she has pain in her lower back and legs. She rates her pain today at 7/10. He has not had a hospital visit for Sickle Cell Crisis since 08/07/2018 where she was treated and discharged the same day. She is currently taking all medications as prescribed and staying well hydrated. She reports occasional nausea, constipation, dizziness and headaches. She states that she has been having increased episodes of crisis. Her baby is now 7 months and is doing well. Her anxiety is mild today. She denies suicidal ideations, homicidal ideations, or auditory  hallucinations. She denies fevers, chills, fatigue, recent infections, weight loss, and night sweats. She has not had any visual changes, and falls. No chest pain, heart palpitations, cough and shortness of breath reported. No reports of GI problems such as diarrhea, and constipation. She has no reports of blood in stools, dysuria and hematuria. Observations/Objective:    Telephone Virtual Visit   Assessment and Plan:  1. Hospital discharge follow-up  2. Hb-SS disease with crisis ([place]) - Urinalysis Dipstick  3. Hb-SS disease without crisis [facility]) She is doing well today. She will continue to take pain medications as prescribed; will continue to avoid extreme heat and cold; will continue to eat a healthy diet and drink at least 64 ounces of water daily; continue stool softener as needed; will avoid colds and flu; will continue to get plenty of sleep and rest; will continue to avoid high stressful situations and remain infection free; will continue Folic Acid 1 mg daily to avoid sickle cell crisis. Continue to follow up with Hematologist as needed.   4. Chronic prescription opiate use  5. Chronic pain syndrome  6. Anemia, unspecified type  7. Follow up She will follow up in 2 months.     No orders of the defined types were placed in this encounter.   Orders Placed This Encounter  Procedures  . Urinalysis Dipstick    Referral Orders  No referral(s) requested today    [name],  MSN, FNP-BC [place] [address] [place], [place] [address] [phone]- fax  I discussed the assessment and treatment plan with the patient. The patient was provided an opportunity to ask questions and all were answered. The patient agreed with the plan and demonstrated an understanding of the instructions.   The patient was advised to call back or seek an in-person evaluation if the symptoms worsen or if  the condition fails to improve as anticipated.  I provided 20 minutes of non-face-to-face time during this encounter.   Azzie Glatter, FNP

## 2019-08-24 ENCOUNTER — Telehealth: Payer: Self-pay | Admitting: Family Medicine

## 2019-08-24 ENCOUNTER — Other Ambulatory Visit: Payer: Self-pay | Admitting: Family Medicine

## 2019-08-24 DIAGNOSIS — D571 Sickle-cell disease without crisis: Secondary | ICD-10-CM

## 2019-08-24 DIAGNOSIS — G894 Chronic pain syndrome: Secondary | ICD-10-CM

## 2019-08-24 DIAGNOSIS — Z79891 Long term (current) use of opiate analgesic: Secondary | ICD-10-CM

## 2019-08-24 MED ORDER — OXYCODONE HCL 15 MG PO TABS: 15.0000 mg | ORAL_TABLET | ORAL | 0 refills | Status: DC | PRN

## 2019-08-24 NOTE — Telephone Encounter (Signed)
done

## 2019-08-28 ENCOUNTER — Other Ambulatory Visit: Payer: Self-pay

## 2019-08-28 ENCOUNTER — Encounter: Payer: Self-pay | Admitting: Family Medicine

## 2019-08-28 ENCOUNTER — Ambulatory Visit (INDEPENDENT_AMBULATORY_CARE_PROVIDER_SITE_OTHER): Payer: Medicaid Other | Admitting: Family Medicine

## 2019-08-28 VITALS — BP 120/68 | HR 80 | Temp 98.1°F | Ht 63.0 in | Wt 123.4 lb

## 2019-08-28 DIAGNOSIS — D571 Sickle-cell disease without crisis: Secondary | ICD-10-CM | POA: Diagnosis not present

## 2019-08-28 DIAGNOSIS — F319 Bipolar disorder, unspecified: Secondary | ICD-10-CM

## 2019-08-28 DIAGNOSIS — G894 Chronic pain syndrome: Secondary | ICD-10-CM | POA: Diagnosis not present

## 2019-08-28 DIAGNOSIS — Z09 Encounter for follow-up examination after completed treatment for conditions other than malignant neoplasm: Secondary | ICD-10-CM | POA: Diagnosis not present

## 2019-08-28 DIAGNOSIS — Z79891 Long term (current) use of opiate analgesic: Secondary | ICD-10-CM | POA: Diagnosis not present

## 2019-08-28 DIAGNOSIS — R630 Anorexia: Secondary | ICD-10-CM

## 2019-08-28 LAB — POCT URINALYSIS DIPSTICK
Bilirubin, UA: NEGATIVE
Blood, UA: NEGATIVE
Glucose, UA: NEGATIVE
Ketones, UA: NEGATIVE
Leukocytes, UA: NEGATIVE
Nitrite, UA: NEGATIVE
Protein, UA: NEGATIVE
Spec Grav, UA: 1.025 (ref 1.010–1.025)
Urobilinogen, UA: 1 E.U./dL
pH, UA: 6 (ref 5.0–8.0)

## 2019-08-28 MED ORDER — MEGESTROL ACETATE 20 MG PO TABS: 20.0000 mg | ORAL_TABLET | Freq: Every day | ORAL | 1 refills | Status: DC

## 2019-08-28 NOTE — Progress Notes (Signed)
Patient [facility]   Established Patient Office Visit  Subjective:  Patient ID: Cedric Moorehead, female    DOB: [birth year]  Age: 24 y.o. MRN: [id number]  CC:  Chief Complaint  Patient presents with  . Follow-up    Sickle Cell  . Hospitalization Follow-up    08/22/2019 Dx. Pnemonia    HPI Rilynn Botero is a 24 year old female who  presents for Follow Up today.    Past Medical History:  Diagnosis Date  . Blood transfusion without reported diagnosis   . Bronchitis   . Chickenpox   . Depression   . Elevated ferritin level 05/2019  . Heart murmur   . Pulmonary hypertension ([place])   . Sickle cell anemia (HCC)   . 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 Thrombocytosis ([place]) 05/2019  . Urinary tract infection   . Vitamin D deficiency    Current Status: Since her last office visit, she is doing well with no complaints. She states that she has pain in her arms and legs. She rates her pain today at 5/10. She has not had a hospital visit for Sickle Cell Crisis since 08/22/2019 and diagnosed with Pneumonia, where she was treated and discharged the same day. She denies cough or congestion. She is currently taking all medications as prescribed and staying well hydrated. She reports occasional nausea, constipation, dizziness and headaches. Her anxiety is mild today. She denies suicidal ideations, homicidal ideations, or auditory hallucinations. She denies fevers, chills, fatigue, recent infections, weight loss, and night sweats. She has not had any visual changes, and falls. No chest pain, heart palpitations, cough and shortness of breath reported. No reports of GI problems such as vomiting, and diarrhea. She has no reports of blood in stools, dysuria and hematuria. She has a decrease appetite.   Past Surgical History:  Procedure Laterality Date  . CHOLECYSTECTOMY  2011  . EYE SURGERY     Sty removal  . LABIAL ADHESION LYSIS  1999  . 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 TONSILLECTOMY  2012    Family History  Problem Relation Age of Onset  . Arthritis Other        grandparent  . Stroke Other   . Hypertension Other   . Diabetes Other        grandparent  . Cancer - Other Other        Glioblastoma    Social History   Socioeconomic History  . Marital status: Single    Spouse name: Not on file  . Number of children: Not on file  . Years of education: Not on file  . Highest education level: Not on file  Occupational History  . Not on file  Tobacco Use  . Smoking status: Former Research scientist (life sciences)  . Smokeless tobacco: Never Used  Substance and Sexual Activity  . Alcohol use: Yes    Alcohol/week: 0.0 standard drinks    Comment: occ  . Drug use: No  . Sexual activity: Yes    Birth control/protection: Implant  Other Topics Concern  . Not on file  Social History Narrative   Lives with mom in a one story home.  No children.  Currently not working.  Education: 2 years of college.    Social Determinants of Health   Financial Resource Strain:   . Difficulty of Paying Living Expenses: Not on file  Food Insecurity:   . Worried  About Running Out of Food in the Last Year: Not on file  . Ran Out of Food in the Last Year: Not on file  Transportation Needs:   . Lack of Transportation (Medical): Not on file  . Lack of Transportation (Non-Medical): Not on file  Physical Activity:   . Days of Exercise per Week: Not on file  . Minutes of Exercise per Session: Not on file  Stress:   . Feeling of Stress : Not on file  Social Connections:   . Frequency of Communication with Friends and Family: Not on file  . Frequency of Social Gatherings with Friends and Family: Not on file  . Attends Religious Services: Not on file  . Active Member of Clubs or Organizations: Not on file  . Attends Archivist Meetings: Not on file  . Marital Status: Not on file  Intimate Partner Violence:   . Fear of  Current or Ex-Partner: Not on file  . Emotionally Abused: Not on file  . Physically Abused: Not on file  . Sexually Abused: Not on file    Outpatient Medications Prior to Visit  Medication Sig Dispense Refill  . doxycycline (VIBRAMYCIN) 100 MG capsule Take by mouth.    . doxylamine, Sleep, (UNISOM) 25 MG tablet Take 25 mg by mouth at bedtime as needed for sleep.     . folic acid (FOLVITE) 1 MG tablet Take 1 tablet (1 mg total) by mouth daily. 90 tablet 3  . hydroxyurea (HYDREA) 500 MG capsule TAKE 3 CAPSULES (1,500 MG TOTAL) BY MOUTH DAILY. 90 capsule 6  . ibuprofen (ADVIL,MOTRIN) 600 MG tablet Take 1 tablet (600 mg total) by mouth every 8 (eight) hours as needed. (Patient taking differently: Take 600 mg by mouth every 8 (eight) hours as needed for moderate pain. ) 30 tablet 3  . ondansetron (ZOFRAN) 4 MG tablet Take 1 tablet (4 mg total) by mouth every 8 (eight) hours as needed for nausea or vomiting. 20 tablet 5  . oxyCODONE (ROXICODONE) 15 MG immediate release tablet Take 1 tablet (15 mg total) by mouth every 4 (four) hours as needed for up to 15 days for pain. 90 tablet 0  . sertraline (ZOLOFT) 25 MG tablet Take 25 mg by mouth daily.    . Vitamin D, Ergocalciferol, (DRISDOL) 1.25 MG (50000 UT) CAPS capsule Take 1 capsule (50,000 Units total) by mouth every Saturday. Saturday. 5 capsule 6  . Prenatal Vit-Fe Fumarate-FA (MULTIVITAMIN-PRENATAL) 27-0.8 MG TABS tablet Take 1 tablet by mouth daily at 12 noon.    Marland Kitchen tiZANidine (ZANAFLEX) 4 MG capsule Take 1 capsule (4 mg total) by mouth 3 (three) times daily. (Patient not taking: Reported on 08/28/2019) 60 capsule 1   No facility-administered medications prior to visit.    Allergies  Allergen Reactions  . Latex Rash    ROS Review of Systems  Constitutional: Negative.   HENT: Negative.   Eyes: Negative.   Respiratory: Negative.   Cardiovascular: Negative.   Gastrointestinal: Positive for constipation (occasional ) and nausea (occasional  ).  Endocrine: Negative.   Genitourinary: Negative.   Musculoskeletal: Positive for arthralgias (generalized joint pain).  Skin: Negative.   Allergic/Immunologic: Negative.   Neurological: Positive for dizziness (occasional ) and headaches (occasional ).  Hematological: Negative.   Psychiatric/Behavioral: Negative.    Objective:    Physical Exam  Constitutional: She is oriented to person, place, and time. She appears well-developed.  HENT:  Head: Normocephalic and atraumatic.  Eyes: Conjunctivae are normal.  Cardiovascular:  Normal rate, regular rhythm, normal heart sounds and intact distal pulses.  Pulmonary/Chest: Effort normal and breath sounds normal.  Abdominal: Soft. Bowel sounds are normal.  Musculoskeletal:        General: Normal range of motion.     Cervical back: Normal range of motion and neck supple.  Neurological: She is alert and oriented to person, place, and time. She has normal reflexes.  Skin: Skin is warm and dry.  Psychiatric: She has a normal mood and affect. Her behavior is normal. Judgment and thought content normal.  Nursing note and vitals reviewed.   BP 120/68   Pulse 80   Temp 98.1 F (36.7 C) (Oral)   Ht 5\' 3"  (1.6 m)   Wt 123 lb 6.4 oz (56 kg)   LMP 08/04/2019   SpO2 96%   Breastfeeding Unknown   BMI 21.86 kg/m  Wt Readings from Last 3 Encounters:  08/28/19 123 lb 6.4 oz (56 kg)  05/17/19 128 lb 9.6 oz (58.3 kg)  09/27/18 136 lb 6.4 oz (61.9 kg)     Health Maintenance Due  Topic Date Due  . PAP-Cervical Cytology Screening  09/01/2014  . PAP SMEAR-Modifier  09/01/2017    There are no preventive care reminders to display for this patient.  No results found for: TSH Lab Results  Component Value Date   WBC 11.8 (H) 05/17/2019   HGB 7.0 (LL) 05/17/2019   HCT 20.9 (L) 05/17/2019   MCV 103 (H) 05/17/2019   PLT 487 (H) 05/17/2019   Lab Results  Component Value Date   NA 142 05/17/2019   K 4.5 05/17/2019   CO2 21 05/17/2019    GLUCOSE 92 05/17/2019   BUN 6 05/17/2019   CREATININE 0.53 (L) 05/17/2019   BILITOT 2.4 (H) 05/17/2019   ALKPHOS 113 05/17/2019   AST 33 05/17/2019   ALT 22 05/17/2019   PROT 6.6 05/17/2019   ALBUMIN 4.3 05/17/2019   CALCIUM 9.4 05/17/2019   ANIONGAP 7 05/09/2019   No results found for: CHOL No results found for: HDL No results found for: LDLCALC No results found for: TRIG No results found for: CHOLHDL No results found for: HGBA1C    Assessment & Plan:   1. Hospital discharge follow-up  2. Hb-SS disease without crisis [facility]) Since her last office visit, she is doing well with no complaints. She states that she has pain in her arms and legs. she rates her pain today at 5/10. She has not had a hospital visit for Sickle Cell Crisis since 08/22/2019 where she was treated and discharged the same day. She is currently taking all medications as prescribed and staying well hydrated. She reports occasional nausea, constipation, dizziness and headaches.  - POCT urinalysis dipstick - CBC with Differential - megestrol (MEGACE) 20 MG tablet; Take 1 tablet (20 mg total) by mouth daily.  Dispense: 30 tablet; Refill: 1  3. Chronic prescription opiate use  4. Chronic pain syndrome  5. Bipolar and related disorder ([place])  6. Decreased appetite - megestrol (MEGACE) 20 MG tablet; Take 1 tablet (20 mg total) by mouth daily.  Dispense: 30 tablet; Refill: 1  7. Follow up She will follow in 2 months.   Meds ordered this encounter  Medications  . megestrol (MEGACE) 20 MG tablet    Sig: Take 1 tablet (20 mg total) by mouth daily.    Dispense:  30 tablet    Refill:  1    Orders Placed This Encounter  Procedures  . CBC  with Differential  . POCT urinalysis dipstick    Referral Orders  No referral(s) requested today    [name],  MSN, FNP-BC [place], [place]  [address] [phone]- fax  Problem List Items Addressed This Visit      Other   Bipolar and related disorder ([place])   Sickle cell anemia ([place])   Relevant Medications   megestrol (MEGACE) 20 MG tablet   Other Relevant Orders   POCT urinalysis dipstick   CBC with Differential    Other Visit Diagnoses    Hospital discharge follow-up    -  Primary   Chronic prescription opiate use       Chronic pain syndrome       Decreased appetite       Relevant Medications   megestrol (MEGACE) 20 MG tablet   Follow up          Meds ordered this encounter  Medications  . megestrol (MEGACE) 20 MG tablet    Sig: Take 1 tablet (20 mg total) by mouth daily.    Dispense:  30 tablet    Refill:  1    Follow-up: No follow-ups on file.    Azzie Glatter, FNP

## 2019-08-29 LAB — CBC WITH DIFFERENTIAL/PLATELET
Basophils Absolute: 0.1 10*3/uL (ref 0.0–0.2)
Basos: 1 %
EOS (ABSOLUTE): 0.1 10*3/uL (ref 0.0–0.4)
Eos: 1 %
Hematocrit: 21 % — ABNORMAL LOW (ref 34.0–46.6)
Hemoglobin: 7.2 g/dL — ABNORMAL LOW (ref 11.1–15.9)
Immature Grans (Abs): 0.1 10*3/uL (ref 0.0–0.1)
Immature Granulocytes: 1 %
Lymphocytes Absolute: 4.3 10*3/uL — ABNORMAL HIGH (ref 0.7–3.1)
Lymphs: 36 %
MCH: 38.7 pg — ABNORMAL HIGH (ref 26.6–33.0)
MCHC: 34.3 g/dL (ref 31.5–35.7)
MCV: 113 fL — ABNORMAL HIGH (ref 79–97)
Monocytes Absolute: 2.2 10*3/uL — ABNORMAL HIGH (ref 0.1–0.9)
Monocytes: 19 %
NRBC: 2 % — ABNORMAL HIGH (ref 0–0)
Neutrophils Absolute: 5.2 10*3/uL (ref 1.4–7.0)
Neutrophils: 42 %
Platelets: 476 10*3/uL — ABNORMAL HIGH (ref 150–450)
RBC: 1.86 x10E6/uL — CL (ref 3.77–5.28)
RDW: 22 % — ABNORMAL HIGH (ref 11.7–15.4)
WBC: 12 10*3/uL — ABNORMAL HIGH (ref 3.4–10.8)

## 2019-09-11 ENCOUNTER — Telehealth: Payer: Self-pay | Admitting: Family Medicine

## 2019-09-11 ENCOUNTER — Other Ambulatory Visit: Payer: Self-pay | Admitting: Family Medicine

## 2019-09-11 DIAGNOSIS — Z79891 Long term (current) use of opiate analgesic: Secondary | ICD-10-CM

## 2019-09-11 DIAGNOSIS — D571 Sickle-cell disease without crisis: Secondary | ICD-10-CM

## 2019-09-11 DIAGNOSIS — G894 Chronic pain syndrome: Secondary | ICD-10-CM

## 2019-09-11 MED ORDER — OXYCODONE HCL 15 MG PO TABS: 15.0000 mg | ORAL_TABLET | ORAL | 0 refills | Status: DC | PRN

## 2019-09-11 NOTE — Telephone Encounter (Signed)
done

## 2019-09-13 ENCOUNTER — Encounter: Payer: Self-pay | Admitting: Family Medicine

## 2019-09-13 ENCOUNTER — Ambulatory Visit (INDEPENDENT_AMBULATORY_CARE_PROVIDER_SITE_OTHER): Payer: Medicaid Other | Admitting: Family Medicine

## 2019-09-13 ENCOUNTER — Other Ambulatory Visit: Payer: Self-pay

## 2019-09-13 VITALS — BP 122/68 | HR 85 | Temp 99.0°F | Ht 63.0 in | Wt 119.2 lb

## 2019-09-13 DIAGNOSIS — Z79891 Long term (current) use of opiate analgesic: Secondary | ICD-10-CM | POA: Diagnosis not present

## 2019-09-13 DIAGNOSIS — G894 Chronic pain syndrome: Secondary | ICD-10-CM

## 2019-09-13 DIAGNOSIS — J189 Pneumonia, unspecified organism: Secondary | ICD-10-CM | POA: Diagnosis not present

## 2019-09-13 DIAGNOSIS — F319 Bipolar disorder, unspecified: Secondary | ICD-10-CM

## 2019-09-13 DIAGNOSIS — D571 Sickle-cell disease without crisis: Secondary | ICD-10-CM

## 2019-09-13 DIAGNOSIS — Z09 Encounter for follow-up examination after completed treatment for conditions other than malignant neoplasm: Secondary | ICD-10-CM

## 2019-09-13 NOTE — Progress Notes (Signed)
Patient [facility]  Established Patient Office Visit  Subjective:  Patient ID: Sandra Mckay, female    DOB: 1995/12/28  Age: 24 y.o. MRN: [id number]  CC:  Chief Complaint  Patient presents with  . Follow-up    Sickle Cell   . xray    lung    HPI Sandra Mckay is a 24 year old female who presents for Follow Up today.   Past Medical History:  Diagnosis Date  . Blood transfusion without reported diagnosis   . Bronchitis   . Chickenpox   . Depression   . Elevated ferritin level 05/2019  . Heart murmur   . Pulmonary hypertension ([place])   . Sickle cell anemia (HCC)   . 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 Thrombocytosis ([place]) 05/2019  . Urinary tract infection   . Vitamin D deficiency    Current Status: Since her last office visit, she is doing well with no complaints. She states that she has pain in her arms and legs. She rates her pain today at 5/10. She has not had a hospital visit for Sickle Cell Crisis since 08/22/2019 where she was treated and discharged the same day. She is currently taking all medications as prescribed and staying well hydrated. She reports occasional nausea, constipation, dizziness and headaches. She states that she would like to get Chest X-ray today to assess if Pneumonia has resolved. She denies fevers, chills, fatigue, recent infections, weight loss, and night sweats. She has not had any visual changes, and falls. No chest pain, heart palpitations, cough and shortness of breath reported. No reports of GI problems such as vomiting, and diarrhea. She has no reports of blood in stools, dysuria and hematuria. No depression or anxiety reported today. She denies suicidal ideations, homicidal ideations, or auditory hallucinations.  Past Surgical History:  Procedure Laterality Date  . CHOLECYSTECTOMY  2011  . EYE SURGERY     Sty removal  . LABIAL ADHESION LYSIS  1999  . 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[place] for  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  . TONSILLECTOMY  2012    Family History  Problem Relation Age of Onset  . Arthritis Other        grandparent  . Stroke Other   . Hypertension Other   . Diabetes Other        grandparent  . Cancer - Other Other        Glioblastoma    Social History   Socioeconomic History  . Marital status: Single    Spouse name: Not on file  . Number of children: Not on file  . Years of education: Not on file  . Highest education level: Not on file  Occupational History  . Not on file  Tobacco Use  . Smoking status: Former Research scientist (life sciences)  . Smokeless tobacco: Never Used  Substance and Sexual Activity  . Alcohol use: Yes    Alcohol/week: 0.0 standard drinks    Comment: occ  . Drug use: No  . Sexual activity: Yes  Other Topics Concern  . Not on file  Social History Narrative   Lives with mom in a one story home.  No children.  Currently not working.  Education: 2 years of college.    Social Determinants of Health   Financial Resource Strain:   . Difficulty of Paying Living Expenses: Not on file  Food Insecurity:   . Worried About Charity fundraiser in the  Last Year: Not on file  . Ran Out of Food in the Last Year: Not on file  Transportation Needs:   . Lack of Transportation (Medical): Not on file  . Lack of Transportation (Non-Medical): Not on file  Physical Activity:   . Days of Exercise per Week: Not on file  . Minutes of Exercise per Session: Not on file  Stress:   . Feeling of Stress : Not on file  Social Connections:   . Frequency of Communication with Friends and Family: Not on file  . Frequency of Social Gatherings with Friends and Family: Not on file  . Attends Religious Services: Not on file  . Active Member of Clubs or Organizations: Not on file  . Attends Archivist Meetings: Not on file  . Marital Status: Not on file  Intimate Partner Violence:   . Fear of Current or Ex-Partner: Not on file  . Emotionally Abused: Not  on file  . Physically Abused: Not on file  . Sexually Abused: Not on file    Outpatient Medications Prior to Visit  Medication Sig Dispense Refill  . doxylamine, Sleep, (UNISOM) 25 MG tablet Take 25 mg by mouth at bedtime as needed for sleep.     . folic acid (FOLVITE) 1 MG tablet Take 1 tablet (1 mg total) by mouth daily. 90 tablet 3  . hydroxyurea (HYDREA) 500 MG capsule TAKE 3 CAPSULES (1,500 MG TOTAL) BY MOUTH DAILY. 90 capsule 6  . ibuprofen (ADVIL,MOTRIN) 600 MG tablet Take 1 tablet (600 mg total) by mouth every 8 (eight) hours as needed. (Patient taking differently: Take 600 mg by mouth every 8 (eight) hours as needed for moderate pain. ) 30 tablet 3  . megestrol (MEGACE) 20 MG tablet Take 1 tablet (20 mg total) by mouth daily. 30 tablet 1  . ondansetron (ZOFRAN) 4 MG tablet Take 1 tablet (4 mg total) by mouth every 8 (eight) hours as needed for nausea or vomiting. 20 tablet 5  . oxyCODONE (ROXICODONE) 15 MG immediate release tablet Take 1 tablet (15 mg total) by mouth every 4 (four) hours as needed for up to 15 days for pain. 90 tablet 0  . Prenatal Vit-Fe Fumarate-FA (MULTIVITAMIN-PRENATAL) 27-0.8 MG TABS tablet Take 1 tablet by mouth daily at 12 noon.    . sertraline (ZOLOFT) 25 MG tablet Take 25 mg by mouth daily.    Marland Kitchen tiZANidine (ZANAFLEX) 4 MG capsule Take 1 capsule (4 mg total) by mouth 3 (three) times daily. (Patient not taking: Reported on 08/28/2019) 60 capsule 1  . Vitamin D, Ergocalciferol, (DRISDOL) 1.25 MG (50000 UT) CAPS capsule Take 1 capsule (50,000 Units total) by mouth every Saturday. Saturday. 5 capsule 6   No facility-administered medications prior to visit.    Allergies  Allergen Reactions  . Latex Rash    ROS Review of Systems  Constitutional: Negative.   HENT: Negative.   Eyes: Negative.   Respiratory: Negative.   Cardiovascular: Negative.   Gastrointestinal: Positive for constipation (occasional) and nausea (occasional ).  Endocrine: Negative.    Genitourinary: Negative.   Musculoskeletal: Positive for arthralgias (generalized joint pain).  Skin: Negative.   Allergic/Immunologic: Negative.   Neurological: Positive for dizziness (occasional) and headaches (occasional).  Hematological: Negative.   Psychiatric/Behavioral: Negative.       Objective:    Physical Exam  Constitutional: She is oriented to person, place, and time. She appears well-developed and well-nourished.  HENT:  Head: Normocephalic and atraumatic.  Eyes: Conjunctivae are normal.  Cardiovascular: Normal rate, regular rhythm, normal heart sounds and intact distal pulses.  Pulmonary/Chest: Effort normal and breath sounds normal.  Abdominal: Soft. Bowel sounds are normal.  Musculoskeletal:        General: Normal range of motion.     Cervical back: Normal range of motion and neck supple.  Neurological: She is alert and oriented to person, place, and time. She has normal reflexes.  Skin: Skin is warm and dry.  Psychiatric: She has a normal mood and affect. Her behavior is normal. Judgment and thought content normal.  Nursing note and vitals reviewed.   BP 122/68   Pulse 85   Temp 99 F (37.2 C)   Ht 5\' 3"  (1.6 m)   Wt 119 lb 3.2 oz (54.1 kg)   LMP 09/13/2019   SpO2 100%   BMI 21.12 kg/m  Wt Readings from Last 3 Encounters:  09/13/19 119 lb 3.2 oz (54.1 kg)  08/28/19 123 lb 6.4 oz (56 kg)  05/17/19 128 lb 9.6 oz (58.3 kg)     Health Maintenance Due  Topic Date Due  . PAP-Cervical Cytology Screening  09/01/2014  . PAP SMEAR-Modifier  09/01/2017    There are no preventive care reminders to display for this patient.  No results found for: TSH Lab Results  Component Value Date   WBC 12.0 (H) 08/28/2019   HGB 7.2 (L) 08/28/2019   HCT 21.0 (L) 08/28/2019   MCV 113 (H) 08/28/2019   PLT 476 (H) 08/28/2019   Lab Results  Component Value Date   NA 142 05/17/2019   K 4.5 05/17/2019   CO2 21 05/17/2019   GLUCOSE 92 05/17/2019   BUN 6 05/17/2019    CREATININE 0.53 (L) 05/17/2019   BILITOT 2.4 (H) 05/17/2019   ALKPHOS 113 05/17/2019   AST 33 05/17/2019   ALT 22 05/17/2019   PROT 6.6 05/17/2019   ALBUMIN 4.3 05/17/2019   CALCIUM 9.4 05/17/2019   ANIONGAP 7 05/09/2019   No results found for: CHOL No results found for: HDL No results found for: LDLCALC No results found for: TRIG No results found for: CHOLHDL No results found for: HGBA1C    Assessment & Plan:   1. Hb-SS disease without crisis [facility]) She is doing well today. She will continue to take pain medications as prescribed; will continue to avoid extreme heat and cold; will continue to eat a healthy diet and drink at least 64 ounces of water daily; continue stool softener as needed; will avoid colds and flu; will continue to get plenty of sleep and rest; will continue to avoid high stressful situations and remain infection free; will continue Folic Acid 1 mg daily to avoid sickle cell crisis. Continue to follow up with Hematologist as needed.  - Sickle Cell Panel  2. Pneumonia due to infectious organism, unspecified laterality, unspecified part of lung - DG Chest 2 View; Future  3. Chronic pain syndrome  4. Chronic prescription opiate use  5. Bipolar and related disorder ([place]) Stable today.   6. Follow up She will follow up in 2 months.  No orders of the defined types were placed in this encounter.  Orders Placed This Encounter  Procedures  . DG Chest 2 View  . Sickle Cell Panel    Referral Orders  No referral(s) requested today    [name],  MSN, FNP-BC [facility] [address] [place], [place] [address] [phone]- fax   Problem  List Items Addressed This Visit      Other   Bipolar and related disorder ([place])   Sickle cell anemia ([place]) - Primary   Relevant Orders   Sickle Cell Panel    Other Visit Diagnoses    Pneumonia due to infectious organism,  unspecified laterality, unspecified part of lung       Relevant Orders   DG Chest 2 View   Chronic pain syndrome       Chronic prescription opiate use       Follow up          No orders of the defined types were placed in this encounter.   Follow-up: No follow-ups on file.    Azzie Glatter, FNP

## 2019-09-22 ENCOUNTER — Telehealth: Payer: Self-pay | Admitting: Family Medicine

## 2019-09-22 ENCOUNTER — Other Ambulatory Visit: Payer: Self-pay | Admitting: Family Medicine

## 2019-09-22 DIAGNOSIS — D571 Sickle-cell disease without crisis: Secondary | ICD-10-CM

## 2019-09-22 DIAGNOSIS — G894 Chronic pain syndrome: Secondary | ICD-10-CM

## 2019-09-22 DIAGNOSIS — Z79891 Long term (current) use of opiate analgesic: Secondary | ICD-10-CM

## 2019-09-22 MED ORDER — OXYCODONE HCL 15 MG PO TABS: 15.0000 mg | ORAL_TABLET | ORAL | 0 refills | Status: DC | PRN

## 2019-09-25 NOTE — Telephone Encounter (Signed)
Done

## 2019-09-30 ENCOUNTER — Other Ambulatory Visit: Payer: Self-pay | Admitting: Family Medicine

## 2019-09-30 DIAGNOSIS — R630 Anorexia: Secondary | ICD-10-CM

## 2019-09-30 DIAGNOSIS — D571 Sickle-cell disease without crisis: Secondary | ICD-10-CM

## 2019-10-10 ENCOUNTER — Telehealth: Payer: Self-pay | Admitting: Family Medicine

## 2019-10-12 ENCOUNTER — Other Ambulatory Visit: Payer: Self-pay | Admitting: Family Medicine

## 2019-10-12 DIAGNOSIS — D571 Sickle-cell disease without crisis: Secondary | ICD-10-CM

## 2019-10-12 DIAGNOSIS — Z79891 Long term (current) use of opiate analgesic: Secondary | ICD-10-CM

## 2019-10-12 DIAGNOSIS — G894 Chronic pain syndrome: Secondary | ICD-10-CM

## 2019-10-12 MED ORDER — OXYCODONE HCL 15 MG PO TABS: 15.0000 mg | ORAL_TABLET | ORAL | 0 refills | Status: DC | PRN

## 2019-10-12 NOTE — Telephone Encounter (Signed)
Pt called again for medication refill on oxycodone

## 2019-10-17 ENCOUNTER — Ambulatory Visit: Payer: Medicaid Other | Admitting: Family Medicine

## 2019-10-21 DIAGNOSIS — N3000 Acute cystitis without hematuria: Secondary | ICD-10-CM

## 2019-10-21 HISTORY — DX: Acute cystitis without hematuria: N30.00

## 2019-10-22 MED ORDER — GENERIC EXTERNAL MEDICATION
Status: DC
Start: ? — End: 2019-10-22

## 2019-10-22 MED ORDER — SERTRALINE HCL 50 MG PO TABS
25.00 | ORAL_TABLET | ORAL | Status: DC
Start: 2019-10-22 — End: 2019-10-22

## 2019-10-22 MED ORDER — SODIUM CHLORIDE 0.9 % IV SOLN
10.00 | INTRAVENOUS | Status: DC
Start: ? — End: 2019-10-22

## 2019-10-22 MED ORDER — ERGOCALCIFEROL 1.25 MG (50000 UT) PO CAPS
50000.00 | ORAL_CAPSULE | ORAL | Status: DC
Start: 2019-10-28 — End: 2019-10-22

## 2019-10-22 MED ORDER — PRENATAL 27-1 MG PO TABS
1.00 | ORAL_TABLET | ORAL | Status: DC
Start: 2019-10-22 — End: 2019-10-22

## 2019-10-22 MED ORDER — ENOXAPARIN SODIUM 40 MG/0.4ML ~~LOC~~ SOLN
40.00 | SUBCUTANEOUS | Status: DC
Start: 2019-10-22 — End: 2019-10-22

## 2019-10-22 MED ORDER — GENERIC EXTERNAL MEDICATION
1000.00 | Status: DC
Start: 2019-10-22 — End: 2019-10-22

## 2019-10-22 MED ORDER — OXYCODONE HCL 5 MG PO TABS
20.00 | ORAL_TABLET | ORAL | Status: DC
Start: 2019-10-21 — End: 2019-10-22

## 2019-10-22 MED ORDER — HYDROXYUREA 500 MG PO CAPS
1500.00 | ORAL_CAPSULE | ORAL | Status: DC
Start: 2019-10-22 — End: 2019-10-22

## 2019-10-22 MED ORDER — HYDROMORPHONE HCL 1 MG/ML IJ SOLN
1.00 | INTRAMUSCULAR | Status: DC
Start: ? — End: 2019-10-22

## 2019-10-22 MED ORDER — FOLIC ACID 1 MG PO TABS
1.00 | ORAL_TABLET | ORAL | Status: DC
Start: 2019-10-22 — End: 2019-10-22

## 2019-10-22 MED ORDER — ACETAMINOPHEN 500 MG PO TABS
500.00 | ORAL_TABLET | ORAL | Status: DC
Start: ? — End: 2019-10-22

## 2019-10-22 MED ORDER — METOCLOPRAMIDE HCL 10 MG PO TABS
10.00 | ORAL_TABLET | ORAL | Status: DC
Start: ? — End: 2019-10-22

## 2019-10-22 MED ORDER — SODIUM CHLORIDE 0.45 % IV SOLN
125.00 | INTRAVENOUS | Status: DC
Start: ? — End: 2019-10-22

## 2019-10-25 ENCOUNTER — Other Ambulatory Visit: Payer: Self-pay | Admitting: Family Medicine

## 2019-10-25 ENCOUNTER — Telehealth: Payer: Self-pay | Admitting: Family Medicine

## 2019-10-25 DIAGNOSIS — Z79891 Long term (current) use of opiate analgesic: Secondary | ICD-10-CM

## 2019-10-25 DIAGNOSIS — G894 Chronic pain syndrome: Secondary | ICD-10-CM

## 2019-10-25 DIAGNOSIS — D571 Sickle-cell disease without crisis: Secondary | ICD-10-CM

## 2019-10-25 MED ORDER — OXYCODONE HCL 15 MG PO TABS: 15.0000 mg | ORAL_TABLET | ORAL | 0 refills | Status: DC | PRN

## 2019-10-26 NOTE — Telephone Encounter (Signed)
done

## 2019-10-27 ENCOUNTER — Ambulatory Visit: Payer: Medicaid Other | Admitting: Family Medicine

## 2019-10-30 ENCOUNTER — Ambulatory Visit: Payer: Medicaid Other | Admitting: Family Medicine

## 2019-11-01 ENCOUNTER — Ambulatory Visit: Payer: Medicaid Other | Admitting: Family Medicine

## 2019-11-04 ENCOUNTER — Other Ambulatory Visit: Payer: Self-pay | Admitting: Family Medicine

## 2019-11-04 DIAGNOSIS — R630 Anorexia: Secondary | ICD-10-CM

## 2019-11-04 DIAGNOSIS — D571 Sickle-cell disease without crisis: Secondary | ICD-10-CM

## 2019-11-06 ENCOUNTER — Telehealth: Payer: Self-pay | Admitting: Family Medicine

## 2019-11-07 ENCOUNTER — Other Ambulatory Visit: Payer: Self-pay | Admitting: Family Medicine

## 2019-11-07 DIAGNOSIS — G894 Chronic pain syndrome: Secondary | ICD-10-CM

## 2019-11-07 DIAGNOSIS — Z79891 Long term (current) use of opiate analgesic: Secondary | ICD-10-CM

## 2019-11-07 DIAGNOSIS — D571 Sickle-cell disease without crisis: Secondary | ICD-10-CM

## 2019-11-07 MED ORDER — OXYCODONE HCL 15 MG PO TABS: 15.0000 mg | ORAL_TABLET | ORAL | 0 refills | Status: DC | PRN

## 2019-11-07 NOTE — Telephone Encounter (Signed)
Done

## 2019-11-15 ENCOUNTER — Ambulatory Visit (INDEPENDENT_AMBULATORY_CARE_PROVIDER_SITE_OTHER): Payer: Medicaid Other | Admitting: Family Medicine

## 2019-11-15 ENCOUNTER — Encounter: Payer: Self-pay | Admitting: Family Medicine

## 2019-11-15 ENCOUNTER — Other Ambulatory Visit: Payer: Self-pay

## 2019-11-15 DIAGNOSIS — Z09 Encounter for follow-up examination after completed treatment for conditions other than malignant neoplasm: Secondary | ICD-10-CM

## 2019-11-15 DIAGNOSIS — F319 Bipolar disorder, unspecified: Secondary | ICD-10-CM | POA: Diagnosis not present

## 2019-11-15 DIAGNOSIS — D571 Sickle-cell disease without crisis: Secondary | ICD-10-CM

## 2019-11-15 DIAGNOSIS — G894 Chronic pain syndrome: Secondary | ICD-10-CM | POA: Diagnosis not present

## 2019-11-15 DIAGNOSIS — Z79891 Long term (current) use of opiate analgesic: Secondary | ICD-10-CM

## 2019-11-15 NOTE — Progress Notes (Signed)
Virtual Visit via Telephone Note  I connected with Sandra Mckay on 11/16/19 at 11:00 AM EDT by telephone and verified that I am speaking with the correct person using two identifiers.   I discussed the limitations, risks, security and privacy concerns of performing an evaluation and management service by telephone and the availability of in person appointments. I also discussed with the patient that there may be a patient responsible charge related to this service. The patient expressed understanding and agreed to proceed.   History of Present Illness:  Past Surgical History:  Procedure Laterality Date  . CHOLECYSTECTOMY  2011  . EYE SURGERY     Sty removal  . LABIAL ADHESION LYSIS  1999  . 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 TONSILLECTOMY  2012   Family History  Problem Relation Age of Onset  . Arthritis Other        grandparent  . Stroke Other   . Hypertension Other   . Diabetes Other        grandparent  . Cancer - Other Other        Glioblastoma    Social History   Socioeconomic History  . Marital status: Single    Spouse name: Not on file  . Number of children: Not on file  . Years of education: Not on file  . Highest education level: Not on file  Occupational History  . Not on file  Tobacco Use  . Smoking status: Former Research scientist (life sciences)  . Smokeless tobacco: Never Used  Substance and Sexual Activity  . Alcohol use: Yes    Alcohol/week: 0.0 standard drinks    Comment: occ  . Drug use: No  . Sexual activity: Yes  Other Topics Concern  . Not on file  Social History Narrative   Lives with mom in a one story home.  No children.  Currently not working.  Education: 2 years of college.    Social Determinants of Health   Financial Resource Strain:   . Difficulty of Paying Living Expenses:   Food Insecurity:   . Worried About Charity fundraiser in the Last Year:   . Arboriculturist in the Last Year:   Transportation Needs:   .  Film/video editor (Medical):   Marland Kitchen Lack of Transportation (Non-Medical):   Physical Activity:   . Days of Exercise per Week:   . Minutes of Exercise per Session:   Stress:   . Feeling of Stress :   Social Connections:   . Frequency of Communication with Friends and Family:   . Frequency of Social Gatherings with Friends and Family:   . Attends Religious Services:   . Active Member of Clubs or Organizations:   . Attends Archivist Meetings:   Marland Kitchen Marital Status:   Intimate Partner Violence:   . Fear of Current or Ex-Partner:   . Emotionally Abused:   Marland Kitchen Physically Abused:   . Sexually Abused:     Allergies  Allergen Reactions  . Latex Rash    Past Medical History:  Diagnosis Date  . Blood transfusion without reported diagnosis   . Bronchitis   . Chickenpox   . Depression   . Elevated ferritin level 05/2019  . Heart murmur   . Pulmonary hypertension ([place])   . Sickle cell anemia (HCC)   . 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 Thrombocytosis ([place]) 05/2019  . Urinary tract infection   .  Vitamin D deficiency     Patient Active Problem List   Diagnosis Date Noted  . [redacted] weeks gestation of pregnancy 12/29/2018  . Medication management 12/12/2018  . Bipolar and related disorder ([place]) 04/04/2017  . Sickle cell pain crisis ([place]) 12/05/2016  . Sickle cell anemia with crisis ([place]) 12/05/2016  . Vasovagal syncope 09/18/2016  . Closed fracture of lumbar vertebra without spinal cord injury ([place]) 07/15/2016  . Nausea without vomiting 09/09/2015  . Vitamin D deficiency 07/22/2015  . Sickle cell anemia ([place]) 06/08/2015   Current Status: Since her last office visit, she is doing well with no complaints. She states that she has pain in her arms, legs, and generalized joints. She reports that she has been having increased paint in her knees. She rates her pain today at 6/10. She has not had a hospital visit for Sickle Cell Crisis since 10/20/2019 where she was treated and  discharged the same day. She is currently taking all medications as prescribed and staying well hydrated. She reports occasional nausea, constipation, dizziness and headaches.    Observations/Objective:  Telephone Virtual Visit  Assessment and Plan:  1. Hb-SS disease without crisis [facility]) She is doing well today r/t her chronic pain management. She will continue to take pain medications as prescribed; will continue to avoid extreme heat and cold; will continue to eat a healthy diet and drink at least 64 ounces of water daily; continue stool softener as needed; will avoid colds and flu; will continue to get plenty of sleep and rest; will continue to avoid high stressful situations and remain infection free; will continue Folic Acid 1 mg daily to avoid sickle cell crisis. Continue to follow up with Hematologist as needed.   2. Chronic prescription opiate use  3. Chronic pain syndrome  4. Bipolar and related disorder ([place]) Stable today.   5. Follow up She will follow up in 2 months.   No orders of the defined types were placed in this encounter.   No orders of the defined types were placed in this encounter.   Referral Orders  No referral(s) requested today    [name],  MSN, FNP-BC [facility] [address] [place], [place] [address] [phone] fax  I discussed the assessment and treatment plan with the patient. The patient was provided an opportunity to ask questions and all were answered. The patient agreed with the plan and demonstrated an understanding of the instructions.   The patient was advised to call back or seek an in-person evaluation if the symptoms worsen or if the condition fails to improve as anticipated.  I provided 20 minutes of non-face-to-face time during this encounter.   Azzie Glatter, FNP

## 2019-11-21 ENCOUNTER — Telehealth: Payer: Self-pay | Admitting: Family Medicine

## 2019-11-21 ENCOUNTER — Other Ambulatory Visit: Payer: Self-pay | Admitting: Family Medicine

## 2019-11-21 DIAGNOSIS — G894 Chronic pain syndrome: Secondary | ICD-10-CM

## 2019-11-21 DIAGNOSIS — D571 Sickle-cell disease without crisis: Secondary | ICD-10-CM

## 2019-11-21 DIAGNOSIS — Z79891 Long term (current) use of opiate analgesic: Secondary | ICD-10-CM

## 2019-11-21 MED ORDER — OXYCODONE HCL 15 MG PO TABS: 15.0000 mg | ORAL_TABLET | ORAL | 0 refills | Status: DC | PRN

## 2019-11-21 NOTE — Progress Notes (Signed)
Reviewed PDMP substance reporting system prior to prescribing opiate medications. No inconsistencies noted.   Meds ordered this encounter  Medications  . oxyCODONE (ROXICODONE) 15 MG immediate release tablet    Sig: Take 1 tablet (15 mg total) by mouth every 4 (four) hours as needed for up to 15 days for pain.    Dispense:  90 tablet    Refill:  0    Please do not refill this medication prior to 11/24/2019. Thank you.    Order Specific Question:   Supervising Provider    Answer:   Tresa Garter [id number]     [name]  APRN, MSN, FNP-C [name] [address] [place], [place] [address] [phone]

## 2019-11-22 NOTE — Telephone Encounter (Signed)
Done

## 2019-12-01 ENCOUNTER — Other Ambulatory Visit: Payer: Self-pay | Admitting: Family Medicine

## 2019-12-01 DIAGNOSIS — D571 Sickle-cell disease without crisis: Secondary | ICD-10-CM

## 2019-12-01 DIAGNOSIS — R630 Anorexia: Secondary | ICD-10-CM

## 2019-12-05 ENCOUNTER — Telehealth: Payer: Self-pay | Admitting: Family Medicine

## 2019-12-05 NOTE — Telephone Encounter (Signed)
Pt called in refill on oxycodone 15mg 

## 2019-12-07 ENCOUNTER — Other Ambulatory Visit: Payer: Self-pay | Admitting: Family Medicine

## 2019-12-07 DIAGNOSIS — D571 Sickle-cell disease without crisis: Secondary | ICD-10-CM

## 2019-12-07 DIAGNOSIS — Z79891 Long term (current) use of opiate analgesic: Secondary | ICD-10-CM

## 2019-12-07 DIAGNOSIS — G894 Chronic pain syndrome: Secondary | ICD-10-CM

## 2019-12-07 MED ORDER — OXYCODONE HCL 15 MG PO TABS: 15.0000 mg | ORAL_TABLET | ORAL | 0 refills | Status: DC | PRN

## 2019-12-14 ENCOUNTER — Other Ambulatory Visit: Payer: Self-pay | Admitting: Family Medicine

## 2019-12-14 DIAGNOSIS — R630 Anorexia: Secondary | ICD-10-CM

## 2019-12-14 DIAGNOSIS — D571 Sickle-cell disease without crisis: Secondary | ICD-10-CM

## 2019-12-22 ENCOUNTER — Telehealth: Payer: Self-pay | Admitting: Family Medicine

## 2019-12-22 NOTE — Telephone Encounter (Signed)
Pt called again regarding refill on oxycodone

## 2019-12-25 ENCOUNTER — Telehealth: Payer: Self-pay | Admitting: Family Medicine

## 2019-12-25 ENCOUNTER — Other Ambulatory Visit: Payer: Self-pay | Admitting: Family Medicine

## 2019-12-25 DIAGNOSIS — D571 Sickle-cell disease without crisis: Secondary | ICD-10-CM

## 2019-12-25 DIAGNOSIS — G894 Chronic pain syndrome: Secondary | ICD-10-CM

## 2019-12-25 DIAGNOSIS — Z79891 Long term (current) use of opiate analgesic: Secondary | ICD-10-CM

## 2019-12-25 MED ORDER — OXYCODONE HCL 15 MG PO TABS: 15.0000 mg | ORAL_TABLET | ORAL | 0 refills | Status: DC | PRN

## 2019-12-25 NOTE — Telephone Encounter (Signed)
Done

## 2020-01-02 ENCOUNTER — Ambulatory Visit: Payer: Medicaid Other | Admitting: Family Medicine

## 2020-01-05 ENCOUNTER — Telehealth: Payer: Self-pay | Admitting: Family Medicine

## 2020-01-08 ENCOUNTER — Telehealth: Payer: Self-pay | Admitting: Family Medicine

## 2020-01-08 ENCOUNTER — Other Ambulatory Visit: Payer: Self-pay | Admitting: Family Medicine

## 2020-01-08 DIAGNOSIS — D571 Sickle-cell disease without crisis: Secondary | ICD-10-CM

## 2020-01-08 DIAGNOSIS — G894 Chronic pain syndrome: Secondary | ICD-10-CM

## 2020-01-08 DIAGNOSIS — Z79891 Long term (current) use of opiate analgesic: Secondary | ICD-10-CM

## 2020-01-08 MED ORDER — OXYCODONE HCL 15 MG PO TABS: 15.0000 mg | ORAL_TABLET | ORAL | 0 refills | Status: DC | PRN

## 2020-01-10 NOTE — Telephone Encounter (Signed)
Done

## 2020-01-15 ENCOUNTER — Ambulatory Visit: Payer: Medicaid Other | Admitting: Family Medicine

## 2020-01-22 ENCOUNTER — Other Ambulatory Visit: Payer: Medicaid Other

## 2020-01-23 ENCOUNTER — Other Ambulatory Visit: Payer: Self-pay | Admitting: Family Medicine

## 2020-01-23 ENCOUNTER — Telehealth: Payer: Self-pay | Admitting: Family Medicine

## 2020-01-23 ENCOUNTER — Non-Acute Institutional Stay (HOSPITAL_COMMUNITY)
Admission: AD | Admit: 2020-01-23 | Discharge: 2020-01-23 | Disposition: A | Discharge status: Home / Self Care | Payer: Medicaid Other | Source: Ambulatory Visit | Attending: Internal Medicine | Admitting: Internal Medicine

## 2020-01-23 ENCOUNTER — Other Ambulatory Visit: Payer: Self-pay

## 2020-01-23 ENCOUNTER — Encounter (HOSPITAL_COMMUNITY): Payer: Self-pay | Admitting: Family Medicine

## 2020-01-23 ENCOUNTER — Other Ambulatory Visit: Payer: Medicaid Other

## 2020-01-23 ENCOUNTER — Telehealth (HOSPITAL_COMMUNITY): Payer: Self-pay | Admitting: General Practice

## 2020-01-23 DIAGNOSIS — F1120 Opioid dependence, uncomplicated: Secondary | ICD-10-CM | POA: Insufficient documentation

## 2020-01-23 DIAGNOSIS — D5700 Hb-SS disease with crisis, unspecified: Secondary | ICD-10-CM | POA: Diagnosis present

## 2020-01-23 DIAGNOSIS — D571 Sickle-cell disease without crisis: Secondary | ICD-10-CM

## 2020-01-23 DIAGNOSIS — G894 Chronic pain syndrome: Secondary | ICD-10-CM | POA: Diagnosis not present

## 2020-01-23 DIAGNOSIS — Z87891 Personal history of nicotine dependence: Secondary | ICD-10-CM | POA: Insufficient documentation

## 2020-01-23 DIAGNOSIS — Z79891 Long term (current) use of opiate analgesic: Secondary | ICD-10-CM

## 2020-01-23 DIAGNOSIS — E559 Vitamin D deficiency, unspecified: Secondary | ICD-10-CM

## 2020-01-23 LAB — COMPREHENSIVE METABOLIC PANEL
ALT: 21 U/L (ref 0–44)
AST: 34 U/L (ref 15–41)
Albumin: 5.1 g/dL — ABNORMAL HIGH (ref 3.5–5.0)
Alkaline Phosphatase: 64 U/L (ref 38–126)
Anion gap: 11 (ref 5–15)
BUN: 11 mg/dL (ref 6–20)
CO2: 19 mmol/L — ABNORMAL LOW (ref 22–32)
Calcium: 9.6 mg/dL (ref 8.9–10.3)
Chloride: 108 mmol/L (ref 98–111)
Creatinine, Ser: 0.37 mg/dL — ABNORMAL LOW (ref 0.44–1.00)
GFR calc Af Amer: >60 mL/min (ref >60)
GFR calc non Af Amer: >60 mL/min (ref >60)
Glucose, Bld: 84 mg/dL (ref 70–99)
Potassium: 4 mmol/L (ref 3.5–5.1)
Sodium: 138 mmol/L (ref 135–145)
Total Bilirubin: 4.2 mg/dL — ABNORMAL HIGH (ref 0.3–1.2)
Total Protein: 7.6 g/dL (ref 6.5–8.1)

## 2020-01-23 LAB — RAPID URINE DRUG SCREEN, HOSP PERFORMED
Amphetamines: NOT DETECTED
Barbiturates: NOT DETECTED
Benzodiazepines: NOT DETECTED
Cocaine: NOT DETECTED
Opiates: POSITIVE — AB
Tetrahydrocannabinol: POSITIVE — AB

## 2020-01-23 LAB — RETICULOCYTES
Immature Retic Fract: 39.6 % — ABNORMAL HIGH (ref 2.3–15.9)
RBC.: 1.89 MIL/uL — ABNORMAL LOW (ref 3.87–5.11)
Retic Count, Absolute: 476.1 10*3/uL — ABNORMAL HIGH (ref 19.0–186.0)
Retic Ct Pct: 25.2 % — ABNORMAL HIGH (ref 0.4–3.1)

## 2020-01-23 LAB — CBC WITH DIFFERENTIAL/PLATELET
Abs Immature Granulocytes: 0.07 10*3/uL (ref 0.00–0.07)
Basophils Absolute: 0.1 10*3/uL (ref 0.0–0.1)
Basophils Relative: 1 %
Eosinophils Absolute: 0.1 10*3/uL (ref 0.0–0.5)
Eosinophils Relative: 1 %
HCT: 21.7 % — ABNORMAL LOW (ref 36.0–46.0)
Hemoglobin: 7.6 g/dL — ABNORMAL LOW (ref 12.0–15.0)
Immature Granulocytes: 1 %
Lymphocytes Relative: 30 %
Lymphs Abs: 3.4 10*3/uL (ref 0.7–4.0)
MCH: 39.4 pg — ABNORMAL HIGH (ref 26.0–34.0)
MCHC: 35 g/dL (ref 30.0–36.0)
MCV: 112.4 fL — ABNORMAL HIGH (ref 80.0–100.0)
Monocytes Absolute: 1.8 10*3/uL — ABNORMAL HIGH (ref 0.1–1.0)
Monocytes Relative: 16 %
Neutro Abs: 6 10*3/uL (ref 1.7–7.7)
Neutrophils Relative %: 51 %
Platelets: 446 10*3/uL — ABNORMAL HIGH (ref 150–400)
RBC: 1.93 MIL/uL — ABNORMAL LOW (ref 3.87–5.11)
RDW: 21.2 % — ABNORMAL HIGH (ref 11.5–15.5)
WBC: 11.5 10*3/uL — ABNORMAL HIGH (ref 4.0–10.5)
nRBC: 0.9 % — ABNORMAL HIGH (ref 0.0–0.2)

## 2020-01-23 LAB — PREGNANCY, URINE: Preg Test, Ur: NEGATIVE

## 2020-01-23 MED ORDER — SODIUM CHLORIDE 0.45 % IV SOLN: INTRAVENOUS | Status: DC

## 2020-01-23 MED ORDER — HYDROMORPHONE 1 MG/ML IV SOLN
INTRAVENOUS | Status: DC
  Administered 2020-01-23: 30 mg via INTRAVENOUS
  Administered 2020-01-23: 9 mg via INTRAVENOUS
  Filled 2020-01-23: qty 30

## 2020-01-23 MED ORDER — SODIUM CHLORIDE 0.9% FLUSH: 9.0000 mL | INTRAVENOUS | Status: DC | PRN

## 2020-01-23 MED ORDER — OXYCODONE HCL 15 MG PO TABS: 15.0000 mg | ORAL_TABLET | ORAL | 0 refills | Status: DC | PRN

## 2020-01-23 MED ORDER — NALOXONE HCL 0.4 MG/ML IJ SOLN: 0.4000 mg | INTRAMUSCULAR | Status: DC | PRN

## 2020-01-23 MED ORDER — ACETAMINOPHEN 500 MG PO TABS
1000.0000 mg | ORAL_TABLET | Freq: Once | ORAL | Status: AC
  Administered 2020-01-23: 1000 mg via ORAL
  Filled 2020-01-23: qty 2

## 2020-01-23 MED ORDER — KETOROLAC TROMETHAMINE 30 MG/ML IJ SOLN
15.0000 mg | Freq: Once | INTRAMUSCULAR | Status: AC
  Administered 2020-01-23: 15 mg via INTRAVENOUS
  Filled 2020-01-23: qty 1

## 2020-01-23 MED ORDER — DIPHENHYDRAMINE HCL 25 MG PO CAPS
25.0000 mg | ORAL_CAPSULE | ORAL | Status: DC | PRN
  Administered 2020-01-23: 25 mg via ORAL
  Filled 2020-01-23: qty 1

## 2020-01-23 MED ORDER — ONDANSETRON HCL 4 MG/2ML IJ SOLN: 4.0000 mg | Freq: Four times a day (QID) | INTRAMUSCULAR | Status: DC | PRN

## 2020-01-23 NOTE — Telephone Encounter (Signed)
Pt called in refill on oxycodone 15mg 

## 2020-01-23 NOTE — Progress Notes (Signed)
Patient admitted to the day hospital for treatment of sickle cell pain crisis. Patient reported pain rated 9/10 in the hips and legs . Patient placed on Dilaudid PCA, given PO tylenol, PO benadryl, IV Toradol and hydrated with IV fluids. At discharge patient reported  pain at 6/10. Discharge instructions given to patient. Patient alert, oriented and ambulatory at discharge. Patient will be using "[facility]" to go back home.

## 2020-01-23 NOTE — Discharge Instructions (Signed)
Sickle Cell Anemia, Adult  Sickle cell anemia is a condition where your red blood cells are shaped like sickles. Red blood cells carry oxygen through the body. Sickle-shaped cells do not live as long as normal red blood cells. They also clump together and block blood from flowing through the blood vessels. This prevents the body from getting enough oxygen. Sickle cell anemia causes organ damage and pain. It also increases the risk of infection. Follow these instructions at home: Medicines  Take over-the-counter and prescription medicines only as told by your doctor.  If you were prescribed an antibiotic medicine, take it as told by your doctor. Do not stop taking the antibiotic even if you start to feel better.  If you develop a fever, do not take medicines to lower the fever right away. Tell your doctor about the fever. Managing pain, stiffness, and swelling  Try these methods to help with pain: ? Use a heating pad. ? Take a warm bath. ? Distract yourself, such as by watching TV. Eating and drinking  Drink enough fluid to keep your pee (urine) clear or pale yellow. Drink more in hot weather and during exercise.  Limit or avoid alcohol.  Eat a healthy diet. Eat plenty of fruits, vegetables, whole grains, and lean protein.  Take vitamins and supplements as told by your doctor. Traveling  When traveling, keep these with you: ? Your medical information. ? The names of your doctors. ? Your medicines.  If you need to take an airplane, talk to your doctor first. Activity  Rest often.  Avoid exercises that make your heart beat much faster, such as jogging. General instructions  Do not use products that have nicotine or tobacco, such as cigarettes and e-cigarettes. If you need help quitting, ask your doctor.  Consider wearing a medical alert bracelet.  Avoid being in high places (high altitudes), such as mountains.  Avoid very hot or cold temperatures.  Avoid places where the  temperature changes a lot.  Keep all follow-up visits as told by your doctor. This is important. Contact a doctor if:  A joint hurts.  Your feet or hands hurt or swell.  You feel tired (fatigued). Get help right away if:  You have symptoms of infection. These include: ? Fever. ? Chills. ? Being very tired. ? Irritability. ? Poor eating. ? Throwing up (vomiting).  You feel dizzy or faint.  You have new stomach pain, especially on the left side.  You have a an erection (priapism) that lasts more than 4 hours.  You have numbness in your arms or legs.  You have a hard time moving your arms or legs.  You have trouble talking.  You have pain that does not go away when you take medicine.  You are short of breath.  You are breathing fast.  You have a long-term cough.  You have pain in your chest.  You have a bad headache.  You have a stiff neck.  Your stomach looks bloated even though you did not eat much.  Your skin is pale.  You suddenly cannot see well. Summary  Sickle cell anemia is a condition where your red blood cells are shaped like sickles.  Follow your doctor's advice on ways to manage pain, food to eat, activities to do, and steps to take for safe travel.  Get medical help right away if you have any signs of infection, such as a fever. This information is not intended to replace advice given to you by   your health care provider. Make sure you discuss any questions you have with your health care provider. Document Revised: 11/11/2018 Document Reviewed: 08/25/2016 Elsevier Patient Education  2020 Elsevier Inc.  

## 2020-01-23 NOTE — Telephone Encounter (Signed)
Patient came to the reception area, requesting to be seen at the  day hospital due to pain in the hips and legs rated at 8/10. Denied chest pain, fever, diarrhea, abdominal pain, nausea/vomitting. Endorsed coughing. Screened negative for Covid-19 symptoms. Admitted to having means of transportation without driving self after treatment. Has run out of her prescribed pain medications. Already called in for refills. Per provider, patient can be seen at the day hospital for treatment. Patient notified, verbalized understanding.

## 2020-01-23 NOTE — H&P (Signed)
[facility] History and Physical   Date: 01/24/2020  Patient name: Sandra Mckay Medical record number: [id number] Date of birth: Feb 17, 1996 Age: 24 y.o. Gender: female PCP: Azzie Glatter, FNP  Attending physician: No att. providers found  Chief Complaint: Sickle cell pain  History of Present Illness: Sandra Mckay is a 24 year old female with a medical history significant for sickle cell disease, chronic pain syndrome, opiate dependence and tolerance, and history of anemia of chronic disease presents complaining of generalized pain that is consistent with her typical pain crisis.  Patient states that she has been having increased pain over the past 3 to 4 days.  She was last seen in the ER at [facility] on 01/20/2020.  She states that pain was not controlled at discharge, but she was not able to stay for admission due to childcare constraints.  She says that her pain has not been controlled on home medications for quite some time.  Pain intensity is 10/10 characterized as constant, throbbing, and occasionally sharp.  Patient last had oxycodone this a.m. without sustained relief.  She denies headache, chest pain, shortness of breath, urinary symptoms, nausea, vomiting, or diarrhea.  She has not had sick contacts, recent travel, or exposure to COVID-19.  Meds: No medications prior to admission.    Allergies: Latex Past Medical History:  Diagnosis Date  . Blood transfusion without reported diagnosis   . Bronchitis   . Chickenpox   . Depression   . Elevated ferritin level 05/2019  . Heart murmur   . Pulmonary hypertension ([place])   . Sickle cell anemia (HCC)   . 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 Thrombocytosis ([place]) 05/2019  . Urinary tract infection   . Vitamin D deficiency    Past Surgical History:  Procedure Laterality Date  . CHOLECYSTECTOMY  2011  . EYE SURGERY     Sty removal  . LABIAL ADHESION LYSIS  1999  . 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[place] for  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  . TONSILLECTOMY  2012   Family History  Problem Relation Age of Onset  . Arthritis Other        grandparent  . Stroke Other   . Hypertension Other   . Diabetes Other        grandparent  . Cancer - Other Other        Glioblastoma   Social History   Socioeconomic History  . Marital status: Single    Spouse name: Not on file  . Number of children: Not on file  . Years of education: Not on file  . Highest education level: Not on file  Occupational History  . Not on file  Tobacco Use  . Smoking status: Former Research scientist (life sciences)  . Smokeless tobacco: Never Used  Vaping Use  . Vaping Use: Never used  Substance and Sexual Activity  . Alcohol use: Yes    Alcohol/week: 0.0 standard drinks    Comment: occ  . Drug use: No  . Sexual activity: Yes  Other Topics Concern  . Not on file  Social History Narrative   Lives with mom in a one story home.  Education: 2 years of college.    Social Determinants of Health   Financial Resource Strain:   . Difficulty of Paying Living Expenses:   Food Insecurity:   . Worried About Charity fundraiser in the Last Year:   . [place] in the Last Year:  Transportation Needs:   . Film/video editor (Medical):   Marland Kitchen Lack of Transportation (Non-Medical):   Physical Activity:   . Days of Exercise per Week:   . Minutes of Exercise per Session:   Stress:   . Feeling of Stress :   Social Connections:   . Frequency of Communication with Friends and Family:   . Frequency of Social Gatherings with Friends and Family:   . Attends Religious Services:   . Active Member of Clubs or Organizations:   . Attends Archivist Meetings:   Marland Kitchen Marital Status:   Intimate Partner Violence:   . Fear of Current or Ex-Partner:   . Emotionally Abused:   Marland Kitchen Physically Abused:   . Sexually Abused:    Review of Systems  Constitutional: Negative for chills and fever.  HENT: Negative.   Eyes: Negative.   Respiratory:  Negative.   Cardiovascular: Negative.   Gastrointestinal: Negative.   Genitourinary: Negative.   Musculoskeletal: Positive for back pain and joint pain.  Skin: Negative.   Neurological: Negative.   Psychiatric/Behavioral: Negative.     Physical Exam: Blood pressure (!) 105/59, pulse 65, temperature 98.8 F (37.1 C), temperature source Temporal, resp. rate 12, last menstrual period 01/07/2020, SpO2 95 %, unknown if currently breastfeeding. BP (!) 105/59 (BP Location: Left Arm)   Pulse 65   Temp 98.8 F (37.1 C) (Temporal)   Resp 12   LMP 01/07/2020   SpO2 95%   General Appearance:    Alert, cooperative, no distress, appears stated age  Head:    Normocephalic, without obvious abnormality, atraumatic  Eyes:    PERRL, conjunctiva/corneas clear, EOM's intact, fundi    benign, both eyes  Throat:   Lips, mucosa, and tongue normal; teeth and gums normal  Back:     Symmetric, no curvature, ROM normal, no CVA tenderness  Lungs:     Clear to auscultation bilaterally, respirations unlabored  Chest Wall:    No tenderness or deformity   Heart:    Regular rate and rhythm, S1 and S2 normal, no murmur, rub   or gallop  Abdomen:     Soft, non-tender, bowel sounds active all four quadrants,    no masses, no organomegaly  Extremities:   Extremities normal, atraumatic, no cyanosis or edema  Pulses:   2+ and symmetric all extremities  Skin:   Skin color, texture, turgor normal, no rashes or lesions  Lymph nodes:   Cervical, supraclavicular, and axillary nodes normal  Neurologic:   CNII-XII intact, normal strength, sensation and reflexes    throughout     Lab results: No results found for this or any previous visit (from the past 24 hour(s)).  Imaging results:  No results found.   Assessment & Plan: Patient admitted to sickle cell day infusion center for management of pain crisis.  Patient is opiate tolerant Initiate IV dilaudid PCA. Settings of 0.5 mg, 10 minute lockout, and 3 mg/hr IV  fluids, 0.45% saline at 100 ml/hr Toradol 15 mg IV times one dose Tylenol 1000 mg by mouth times one dose Review CBC with differential, complete metabolic panel, and reticulocytes as results become available. B Pain intensity will be reevaluated in context of functioning and relationship to baseline as care progress If pain intensity remains elevated and/or sudden change in hemodynamic stability transition to inpatient services for higher level of care.     [name]  APRN, MSN, FNP-C [facility] [place]  [place], [place] [address] [phone]  01/24/2020, 5:20 PM

## 2020-01-24 LAB — CMP14+CBC/D/PLT+FER+RETIC+V...
ALT: 18 IU/L (ref 0–32)
AST: 27 IU/L (ref 0–40)
Albumin/Globulin Ratio: 2.6 — ABNORMAL HIGH (ref 1.2–2.2)
Albumin: 4.9 g/dL (ref 3.9–5.0)
Alkaline Phosphatase: 77 IU/L (ref 48–121)
BUN/Creatinine Ratio: 19 (ref 9–23)
BUN: 9 mg/dL (ref 6–20)
Basophils Absolute: 0.1 10*3/uL (ref 0.0–0.2)
Basos: 1 %
Bilirubin Total: 4.1 mg/dL — ABNORMAL HIGH (ref 0.0–1.2)
CO2: 19 mmol/L — ABNORMAL LOW (ref 20–29)
Calcium: 9.8 mg/dL (ref 8.7–10.2)
Chloride: 106 mmol/L (ref 96–106)
Creatinine, Ser: 0.48 mg/dL — ABNORMAL LOW (ref 0.57–1.00)
EOS (ABSOLUTE): 0.1 10*3/uL (ref 0.0–0.4)
Eos: 1 %
Ferritin: 1574 ng/mL — ABNORMAL HIGH (ref 15–150)
GFR calc Af Amer: 157 mL/min/{1.73_m2} (ref >59)
GFR calc non Af Amer: 136 mL/min/{1.73_m2} (ref >59)
Globulin, Total: 1.9 g/dL (ref 1.5–4.5)
Glucose: 85 mg/dL (ref 65–99)
Hematocrit: 20.5 % — ABNORMAL LOW (ref 34.0–46.6)
Hemoglobin: 7 g/dL — CL (ref 11.1–15.9)
Immature Grans (Abs): 0 10*3/uL (ref 0.0–0.1)
Immature Granulocytes: 1 %
Lymphocytes Absolute: 3 10*3/uL (ref 0.7–3.1)
Lymphs: 33 %
MCH: 38.9 pg — ABNORMAL HIGH (ref 26.6–33.0)
MCHC: 34.1 g/dL (ref 31.5–35.7)
MCV: 114 fL — ABNORMAL HIGH (ref 79–97)
Monocytes Absolute: 1.5 10*3/uL — ABNORMAL HIGH (ref 0.1–0.9)
Monocytes: 17 %
NRBC: 1 % — ABNORMAL HIGH (ref 0–0)
Neutrophils Absolute: 4.2 10*3/uL (ref 1.4–7.0)
Neutrophils: 47 %
Platelets: 442 10*3/uL (ref 150–450)
Potassium: 4.3 mmol/L (ref 3.5–5.2)
RBC: 1.8 x10E6/uL — CL (ref 3.77–5.28)
RDW: 20.1 % — ABNORMAL HIGH (ref 11.7–15.4)
Retic Ct Pct: 20.5 % — ABNORMAL HIGH (ref 0.6–2.6)
Sodium: 140 mmol/L (ref 134–144)
Total Protein: 6.8 g/dL (ref 6.0–8.5)
Vit D, 25-Hydroxy: 9.1 ng/mL — ABNORMAL LOW (ref 30.0–100.0)
WBC: 8.9 10*3/uL (ref 3.4–10.8)

## 2020-01-24 NOTE — Discharge Summary (Signed)
[facility] Discharge Summary   Patient ID: Sandra Mckay MRN: [id number] DOB/AGE: 05-02-1996 24 y.o.  Admit date: 01/23/2020 Discharge date: 01/24/2020  Primary Care Physician:  Azzie Glatter, FNP  Admission Diagnoses:  Active Problems:   Sickle cell pain crisis [facility])   Discharge Medications:  Allergies as of 01/23/2020      Reactions   Latex Rash      Medication List    TAKE these medications   doxylamine (Sleep) 25 MG tablet Commonly known as: UNISOM Take 25 mg by mouth at bedtime as needed for sleep.   folic acid 1 MG tablet Commonly known as: FOLVITE Take 1 tablet (1 mg total) by mouth daily.   hydroxyurea 500 MG capsule Commonly known as: HYDREA TAKE 3 CAPSULES (1,500 MG TOTAL) BY MOUTH DAILY.   ibuprofen 600 MG tablet Commonly known as: ADVIL Take 1 tablet (600 mg total) by mouth every 8 (eight) hours as needed. What changed: reasons to take this   megestrol 20 MG tablet Commonly known as: MEGACE TAKE 1 TABLET BY MOUTH EVERY DAY   multivitamin-prenatal 27-0.8 MG Tabs tablet Take 1 tablet by mouth daily at 12 noon.   ondansetron 4 MG tablet Commonly known as: Zofran Take 1 tablet (4 mg total) by mouth every 8 (eight) hours as needed for nausea or vomiting.   sertraline 25 MG tablet Commonly known as: ZOLOFT Take 25 mg by mouth daily.   tiZANidine 4 MG capsule Commonly known as: Zanaflex Take 1 capsule (4 mg total) by mouth 3 (three) times daily.   Vitamin D (Ergocalciferol) 1.25 MG (50000 UNIT) Caps capsule Commonly known as: DRISDOL Take 1 capsule (50,000 Units total) by mouth every Saturday. Saturday.        Consults:  None  Significant Diagnostic Studies:  No results found.  History of present illness:  Sandra Mckay is a 24 year old female with a medical history significant for sickle cell disease, chronic pain syndrome, opiate dependence and tolerance, and history of anemia of chronic disease presents  complaining of generalized pain that is consistent with her typical pain crisis.  Patient states that she has been having increased pain over the past 3 to 4 days.  She was last seen in the ER at [facility] on 01/20/2020.  She states that pain was not controlled at discharge, but she was not able to stay for admission due to childcare constraints.  She says that her pain has not been controlled on home medications for quite some time.  Pain intensity is 10/10 characterized as constant, throbbing, and occasionally sharp.  Patient last had oxycodone this a.m. without sustained relief.  She denies headache, chest pain, shortness of breath, urinary symptoms, nausea, vomiting, or diarrhea.  She has not had sick contacts, recent travel, or exposure to COVID-19. [facility] Course: Patient admitted to sickle cell day infusion center for management of pain crisis.  Hemoglobin 7.6, which appears to be consistent with patient's baseline.  There is no clinical indication for blood transfusion on today. Pain managed with IV Dilaudid via PCA with settings of 0.5 mg, 10-minute lockout, and 3 mg/h Toradol 15 mg IV x1 Tylenol 1000 mg by mouth x1 IV fluids, 0.45% saline at 125 mL/h Pain intensity decreased to 6/10.  Patient offered admission, is unable to be admitted at this time.  Patient advised to return in a.m. if pain intensity continues to be elevated. She is alert, oriented, and ambulating without assistance.  Patient will discharge  home in a hemodynamically stable condition.  Discharge instructions:  Resume all home medications.   Follow up with PCP as previously  scheduled.   Discussed the importance of drinking 64 ounces of water daily, dehydration of red blood cells may lead further sickling.   Avoid all stressors that precipitate sickle cell pain crisis.     The patient was given clear instructions to go to ER or return to medical center if symptoms do not improve, worsen or new  problems develop.     Physical Exam at Discharge:  BP (!) 105/59 (BP Location: Left Arm)    Pulse 65    Temp 98.8 F (37.1 C) (Temporal)    Resp 12    LMP 01/07/2020    SpO2 95%   Physical Exam Constitutional:      Appearance: Normal appearance.  Eyes:     Pupils: Pupils are equal, round, and reactive to light.  Cardiovascular:     Rate and Rhythm: Normal rate and regular rhythm.     Pulses: Normal pulses.  Pulmonary:     Effort: Pulmonary effort is normal.  Abdominal:     General: Bowel sounds are normal.  Musculoskeletal:        General: Normal range of motion.  Skin:    General: Skin is warm.  Neurological:     General: No focal deficit present.     Mental Status: She is alert. Mental status is at baseline.  Psychiatric:        Mood and Affect: Mood normal.        Thought Content: Thought content normal.        Judgment: Judgment normal.       Disposition at Discharge: Discharge disposition: 01-Home or Self Care       Discharge Orders: Discharge Instructions    Discharge patient   Complete by: As directed    Discharge disposition: 01-Home or Self Care   Discharge patient date: 01/23/2020      Condition at Discharge:   Stable  Time spent on Discharge:  Greater than 30 minutes.  Signed: [name]  APRN, MSN, FNP-C Patient [facility] [address] [place], [place] [address] [phone]  01/24/2020, 5:14 PM

## 2020-01-30 ENCOUNTER — Other Ambulatory Visit: Payer: Self-pay | Admitting: Family Medicine

## 2020-01-30 DIAGNOSIS — E559 Vitamin D deficiency, unspecified: Secondary | ICD-10-CM

## 2020-01-30 MED ORDER — VITAMIN D (ERGOCALCIFEROL) 1.25 MG (50000 UNIT) PO CAPS: 50000.0000 [IU] | ORAL_CAPSULE | ORAL | 5 refills | Status: DC

## 2020-01-30 NOTE — Progress Notes (Signed)
Patient given results; no additional questions.

## 2020-02-06 ENCOUNTER — Telehealth: Payer: Self-pay | Admitting: Family Medicine

## 2020-02-06 NOTE — Telephone Encounter (Signed)
Pt called in refill on oxycodone 15mg 

## 2020-02-07 ENCOUNTER — Telehealth: Payer: Self-pay | Admitting: Family Medicine

## 2020-02-08 ENCOUNTER — Other Ambulatory Visit: Payer: Self-pay | Admitting: Family Medicine

## 2020-02-08 DIAGNOSIS — D571 Sickle-cell disease without crisis: Secondary | ICD-10-CM

## 2020-02-08 DIAGNOSIS — G894 Chronic pain syndrome: Secondary | ICD-10-CM

## 2020-02-08 DIAGNOSIS — Z79891 Long term (current) use of opiate analgesic: Secondary | ICD-10-CM

## 2020-02-08 MED ORDER — OXYCODONE HCL 15 MG PO TABS: 15.0000 mg | ORAL_TABLET | ORAL | 0 refills | Status: DC | PRN

## 2020-02-08 NOTE — Telephone Encounter (Signed)
Done

## 2020-02-09 ENCOUNTER — Ambulatory Visit (INDEPENDENT_AMBULATORY_CARE_PROVIDER_SITE_OTHER): Payer: Medicaid Other | Admitting: Family Medicine

## 2020-02-09 ENCOUNTER — Other Ambulatory Visit: Payer: Self-pay

## 2020-02-09 DIAGNOSIS — Z79891 Long term (current) use of opiate analgesic: Secondary | ICD-10-CM

## 2020-02-09 DIAGNOSIS — Z79899 Other long term (current) drug therapy: Secondary | ICD-10-CM | POA: Diagnosis not present

## 2020-02-09 DIAGNOSIS — M62838 Other muscle spasm: Secondary | ICD-10-CM

## 2020-02-09 DIAGNOSIS — D571 Sickle-cell disease without crisis: Secondary | ICD-10-CM | POA: Diagnosis not present

## 2020-02-09 DIAGNOSIS — Z09 Encounter for follow-up examination after completed treatment for conditions other than malignant neoplasm: Secondary | ICD-10-CM

## 2020-02-09 DIAGNOSIS — F319 Bipolar disorder, unspecified: Secondary | ICD-10-CM

## 2020-02-09 DIAGNOSIS — G894 Chronic pain syndrome: Secondary | ICD-10-CM

## 2020-02-09 MED ORDER — OXYCODONE HCL ER 10 MG PO T12A: 10.0000 mg | EXTENDED_RELEASE_TABLET | Freq: Two times a day (BID) | ORAL | 0 refills | Status: DC

## 2020-02-09 MED ORDER — IBUPROFEN 800 MG PO TABS: 800.0000 mg | ORAL_TABLET | Freq: Three times a day (TID) | ORAL | 6 refills | Status: DC | PRN

## 2020-02-09 MED ORDER — TIZANIDINE HCL 4 MG PO CAPS: 4.0000 mg | ORAL_CAPSULE | Freq: Three times a day (TID) | ORAL | 6 refills | Status: DC | PRN

## 2020-02-09 NOTE — Progress Notes (Signed)
Virtual Visit via Telephone Note  I connected with Sandra Mckay on 02/09/20 at  9:20 AM EDT by telephone and verified that I am speaking with the correct person using two identifiers.   I discussed the limitations, risks, security and privacy concerns of performing an evaluation and management service by telephone and the availability of in person appointments. I also discussed with the patient that there may be a patient responsible charge related to this service. The patient expressed understanding and agreed to proceed.  Televisit Today Patient Location: Home Provider Location: Office  History of Present Illness:  Past Medical History:  Diagnosis Date  . Blood transfusion without reported diagnosis   . Bronchitis   . Chickenpox   . Depression   . Elevated ferritin level 05/2019  . Heart murmur   . Pulmonary hypertension ([place])   . Sickle cell anemia (HCC)   . 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 Thrombocytosis ([place]) 05/2019  . Urinary tract infection   . Vitamin D deficiency     Social History   Socioeconomic History  . Marital status: Single    Spouse name: Not on file  . Number of children: Not on file  . Years of education: Not on file  . Highest education level: Not on file  Occupational History  . Not on file  Tobacco Use  . Smoking status: Former Research scientist (life sciences)  . Smokeless tobacco: Never Used  Vaping Use  . Vaping Use: Never used  Substance and Sexual Activity  . Alcohol use: Yes    Alcohol/week: 0.0 standard drinks    Comment: occ  . Drug use: No  . Sexual activity: Yes  Other Topics Concern  . Not on file  Social History Narrative   Lives with mom in a one story home.  Education: 2 years of college.    Social Determinants of Health   Financial Resource Strain:   . Difficulty of Paying Living Expenses:   Food Insecurity:   . Worried About Charity fundraiser in the Last Year:   . Arboriculturist in the Last Year:   Transportation Needs:   . Consulting civil engineer (Medical):   Marland Kitchen Lack of Transportation (Non-Medical):   Physical Activity:   . Days of Exercise per Week:   . Minutes of Exercise per Session:   Stress:   . Feeling of Stress :   Social Connections:   . Frequency of Communication with Friends and Family:   . Frequency of Social Gatherings with Friends and Family:   . Attends Religious Services:   . Active Member of Clubs or Organizations:   . Attends Archivist Meetings:   Marland Kitchen Marital Status:   Intimate Partner Violence:   . Fear of Current or Ex-Partner:   . Emotionally Abused:   Marland Kitchen Physically Abused:   . Sexually Abused:     Family History  Problem Relation Age of Onset  . Arthritis Other        grandparent  . Stroke Other   . Hypertension Other   . Diabetes Other        grandparent  . Cancer - Other Other        Glioblastoma    Current Outpatient Medications on File Prior to Visit  Medication Sig Dispense Refill  . folic acid (FOLVITE) 1 MG tablet Take 1 tablet (1 mg total) by mouth daily. 90 tablet 3  . hydroxyurea (HYDREA) 500 MG capsule TAKE 3 CAPSULES (  1,500 MG TOTAL) BY MOUTH DAILY. 90 capsule 6  . megestrol (MEGACE) 20 MG tablet TAKE 1 TABLET BY MOUTH EVERY DAY 30 tablet 1  . ondansetron (ZOFRAN) 4 MG tablet Take 1 tablet (4 mg total) by mouth every 8 (eight) hours as needed for nausea or vomiting. 20 tablet 5  . oxyCODONE (ROXICODONE) 15 MG immediate release tablet Take 1 tablet (15 mg total) by mouth every 4 (four) hours as needed for up to 15 days for pain. 90 tablet 0  . sertraline (ZOLOFT) 25 MG tablet Take 25 mg by mouth daily.     . Vitamin D, Ergocalciferol, (DRISDOL) 1.25 MG (50000 UNIT) CAPS capsule Take 1 capsule (50,000 Units total) by mouth every Saturday. Saturday. 5 capsule 5  . doxylamine, Sleep, (UNISOM) 25 MG tablet Take 25 mg by mouth at bedtime as needed for sleep.  (Patient not taking: Reported on 02/09/2020)     No current facility-administered medications on file prior  to visit.   Allergies  Allergen Reactions  . Latex Rash    Patient Active Problem List   Diagnosis Date Noted  . [redacted] weeks gestation of pregnancy 12/29/2018  . Medication management 12/12/2018  . Bipolar and related disorder ([place]) 04/04/2017  . Sickle cell pain crisis ([place]) 12/05/2016  . Sickle cell anemia with crisis ([place]) 12/05/2016  . Vasovagal syncope 09/18/2016  . Closed fracture of lumbar vertebra without spinal cord injury ([place]) 07/15/2016  . Nausea without vomiting 09/09/2015  . Vitamin D deficiency 07/22/2015  . Sickle cell anemia ([place]) 06/08/2015    Current Status: Since her last office visit, she is doing well with no complaints. She states that she has pain in her arms and legs. She rates her pain today at 5/10. She has not had a hospital visit for Sickle Cell Crisis since 01/23/2020 where she was treated and discharged the same day. She is currently taking all medications as prescribed and staying well hydrated. She reports occasional nausea, constipation, dizziness and headaches. Her anxiety is moderate today, r/t her pain management. She denies suicidal ideations, homicidal ideations, or auditory hallucinations. She is requesting a long-acting pain medications today, to minimize hospital visits. She denies fevers, chills, recent infections, weight loss, and night sweats. She has not had any visual changes, and falls. No chest pain, heart palpitations, cough and shortness of breath reported. Denies GI problems such as nausea, vomiting, diarrhea, and constipation. She has no reports of blood in stools, dysuria and hematuria. She is taking all medications as prescribed. She denies pain today.    Observations/Objective: Telephone Virtual Visit   Assessment and Plan:  1. Hospital discharge follow-up  2. Hb-SS disease without crisis [facility]) She is doing well today r/t to her chronic pain management. She will continue to take pain medications as prescribed; will continue to  avoid extreme heat and cold; will continue to eat a healthy diet and drink at least 64 ounces of water daily; continue stool softener as needed; will avoid colds and flu; will continue to get plenty of sleep and rest; will continue to avoid high stressful situations and remain infection free; will continue Folic Acid 1 mg daily to avoid sickle cell crisis. Continue to follow up with Hematologist as needed.  - ibuprofen (ADVIL) 800 MG tablet; Take 1 tablet (800 mg total) by mouth every 8 (eight) hours as needed.  Dispense: 60 tablet; Refill: 6 - tiZANidine (ZANAFLEX) 4 MG capsule; Take 1 capsule (4 mg total) by mouth 3 (three) times daily  as needed for muscle spasms.  Dispense: 90 capsule; Refill: 6 - oxyCODONE (OXYCONTIN) 10 mg 12 hr tablet; Take 1 tablet (10 mg total) by mouth every 12 (twelve) hours.  Dispense: 60 tablet; Refill: 0- oxyCODONE (OXYCONTIN) 10 mg 12 hr tablet; Take 1 tablet (10 mg total) by mouth every 12 (twelve) hours.  Dispense: 60 tablet; Refill: 0  3. Medication management We will initiate Oxycontin 10 mg BID today.  - oxyCODONE (OXYCONTIN) 10 mg 12 hr tablet; Take 1 tablet (10 mg total) by mouth every 12 (twelve) hours.  Dispense: 60 tablet; Refill: 0  4. Chronic prescription opiate use  5. Chronic pain syndrome  6. Muscle spasms of both lower extremities - tiZANidine (ZANAFLEX) 4 MG capsule; Take 1 capsule (4 mg total) by mouth 3 (three) times daily as needed for muscle spasms.  Dispense: 90 capsule; Refill: 6  7. Bipolar and related disorder ([place])   Follow Up Instructions:  She will follow up in 2 months.   Meds ordered this encounter  Medications  . ibuprofen (ADVIL) 800 MG tablet    Sig: Take 1 tablet (800 mg total) by mouth every 8 (eight) hours as needed.    Dispense:  60 tablet    Refill:  6  . tiZANidine (ZANAFLEX) 4 MG capsule    Sig: Take 1 capsule (4 mg total) by mouth 3 (three) times daily as needed for muscle spasms.    Dispense:  90 capsule     Refill:  6  . oxyCODONE (OXYCONTIN) 10 mg 12 hr tablet    Sig: Take 1 tablet (10 mg total) by mouth every 12 (twelve) hours.    Dispense:  60 tablet    Refill:  0    **Initial start of Oxy-ER on 02/09/2020**    Order Specific Question:   Supervising Provider    Answer:   Tresa Garter [[id number]]    No orders of the defined types were placed in this encounter.   Referral Orders  No referral(s) requested today    [name],  MSN, FNP-BC [place] [address] [place], [place] [address] [phone]- fax   I discussed the assessment and treatment plan with the patient. The patient was provided an opportunity to ask questions and all were answered. The patient agreed with the plan and demonstrated an understanding of the instructions.   The patient was advised to call back or seek an in-person evaluation if the symptoms worsen or if the condition fails to improve as anticipated.  I provided 20 minutes of non-face-to-face time during this encounter.   Azzie Glatter, FNP

## 2020-02-16 ENCOUNTER — Ambulatory Visit: Payer: Medicaid Other | Admitting: Family Medicine

## 2020-02-19 ENCOUNTER — Telehealth: Payer: Self-pay | Admitting: Family Medicine

## 2020-02-20 ENCOUNTER — Other Ambulatory Visit: Payer: Self-pay | Admitting: Family Medicine

## 2020-02-20 DIAGNOSIS — G894 Chronic pain syndrome: Secondary | ICD-10-CM

## 2020-02-20 DIAGNOSIS — D571 Sickle-cell disease without crisis: Secondary | ICD-10-CM

## 2020-02-20 DIAGNOSIS — Z79891 Long term (current) use of opiate analgesic: Secondary | ICD-10-CM

## 2020-02-20 MED ORDER — OXYCODONE HCL 15 MG PO TABS: 15.0000 mg | ORAL_TABLET | ORAL | 0 refills | Status: DC | PRN

## 2020-02-20 NOTE — Telephone Encounter (Signed)
Sent to NP 

## 2020-03-05 ENCOUNTER — Telehealth: Payer: Self-pay | Admitting: Family Medicine

## 2020-03-06 ENCOUNTER — Other Ambulatory Visit: Payer: Self-pay | Admitting: Family Medicine

## 2020-03-06 DIAGNOSIS — Z79891 Long term (current) use of opiate analgesic: Secondary | ICD-10-CM

## 2020-03-06 DIAGNOSIS — D571 Sickle-cell disease without crisis: Secondary | ICD-10-CM

## 2020-03-06 DIAGNOSIS — G894 Chronic pain syndrome: Secondary | ICD-10-CM

## 2020-03-06 MED ORDER — OXYCODONE HCL ER 10 MG PO T12A: 10.0000 mg | EXTENDED_RELEASE_TABLET | Freq: Two times a day (BID) | ORAL | 0 refills | Status: DC

## 2020-03-06 MED ORDER — OXYCODONE HCL 15 MG PO TABS: 15.0000 mg | ORAL_TABLET | ORAL | 0 refills | Status: DC | PRN

## 2020-03-06 NOTE — Telephone Encounter (Signed)
Done

## 2020-03-07 ENCOUNTER — Encounter: Payer: Self-pay | Admitting: Family Medicine

## 2020-03-07 ENCOUNTER — Other Ambulatory Visit: Payer: Self-pay | Admitting: Family Medicine

## 2020-03-07 ENCOUNTER — Telehealth: Payer: Self-pay | Admitting: Family Medicine

## 2020-03-07 DIAGNOSIS — G894 Chronic pain syndrome: Secondary | ICD-10-CM

## 2020-03-07 DIAGNOSIS — D571 Sickle-cell disease without crisis: Secondary | ICD-10-CM

## 2020-03-07 DIAGNOSIS — Z79891 Long term (current) use of opiate analgesic: Secondary | ICD-10-CM

## 2020-03-07 MED ORDER — OXYCODONE HCL 15 MG PO TABS: 15.0000 mg | ORAL_TABLET | ORAL | 0 refills | Status: DC | PRN

## 2020-03-08 ENCOUNTER — Ambulatory Visit (INDEPENDENT_AMBULATORY_CARE_PROVIDER_SITE_OTHER): Payer: Medicaid Other | Admitting: Family Medicine

## 2020-03-08 ENCOUNTER — Other Ambulatory Visit: Payer: Self-pay

## 2020-03-08 VITALS — BP 105/56 | HR 87 | Temp 99.2°F | Resp 16 | Ht 63.0 in | Wt 114.0 lb

## 2020-03-08 DIAGNOSIS — D571 Sickle-cell disease without crisis: Secondary | ICD-10-CM

## 2020-03-08 DIAGNOSIS — G894 Chronic pain syndrome: Secondary | ICD-10-CM | POA: Diagnosis not present

## 2020-03-08 DIAGNOSIS — Z79891 Long term (current) use of opiate analgesic: Secondary | ICD-10-CM

## 2020-03-08 DIAGNOSIS — M25561 Pain in right knee: Secondary | ICD-10-CM | POA: Diagnosis not present

## 2020-03-08 DIAGNOSIS — R630 Anorexia: Secondary | ICD-10-CM | POA: Diagnosis not present

## 2020-03-08 DIAGNOSIS — Z09 Encounter for follow-up examination after completed treatment for conditions other than malignant neoplasm: Secondary | ICD-10-CM

## 2020-03-08 LAB — POCT URINALYSIS DIPSTICK
Glucose, UA: NEGATIVE
Ketones, UA: NEGATIVE
Leukocytes, UA: NEGATIVE
Nitrite, UA: NEGATIVE
Protein, UA: POSITIVE — AB
Spec Grav, UA: 1.015 (ref 1.010–1.025)
Urobilinogen, UA: 1 E.U./dL
pH, UA: 5.5 (ref 5.0–8.0)

## 2020-03-08 MED ORDER — MEGESTROL ACETATE 40 MG PO TABS: 40.0000 mg | ORAL_TABLET | Freq: Every day | ORAL | 11 refills | Status: DC

## 2020-03-08 NOTE — Progress Notes (Signed)
Patient [facility]   Established Patient Office Visit  Subjective:  Patient ID: Sandra Mckay, female    DOB: 09-30-1995  Age: 24 y.o. MRN: [id number]  CC:  Chief Complaint  Patient presents with  . Sickle Cell Anemia    HPI [name] is a 24 year old female who presents for Follow Up today.   Patient Active Problem List   Diagnosis Date Noted  . Medication management 12/12/2018  . Bipolar and related disorder ([place]) 04/04/2017  . Sickle cell pain crisis ([place]) 12/05/2016  . Sickle cell anemia with crisis ([place]) 12/05/2016  . Vasovagal syncope 09/18/2016  . Closed fracture of lumbar vertebra without spinal cord injury ([place]) 07/15/2016  . Nausea without vomiting 09/09/2015  . Vitamin D deficiency 07/22/2015  . Sickle cell anemia ([place]) 06/08/2015    Past Medical History:  Diagnosis Date  . Blood transfusion without reported diagnosis   . Bronchitis   . Chickenpox   . Depression   . Elevated ferritin level 05/2019  . Heart murmur   . Pulmonary hypertension ([place])   . Sickle cell anemia (HCC)   . 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 Thrombocytosis ([place]) 05/2019  . Urinary tract infection   . Vitamin D deficiency    Current Status: Since her last office visit, she is doing well with no complaints. She states that she has pain in her right knee and generalized joint pain. She rates her pain today at 7/10. She has not had a Day Hospital visit for Sickle Cell Crisis since 01/23/2020 where she was treated and discharged the same day. She is currently taking all medications as prescribed and staying well hydrated. She reports occasional nausea, constipation, dizziness and headaches. She reports new bone pain, especially in her right knee. She reports that her appetite is not increasing despite daily dose of Megace 20 mg. Her anxiety is mild today. She denies fevers, chills, fatigue, recent infections, weight loss, and night sweats.  She has not had any headaches, visual changes, dizziness, and falls. No chest pain, heart palpitations, cough and shortness of breath reported. Denies GI problems such as vomiting, and diarrhea.  She has no reports of blood in stools, dysuria and hematuria. She is taking all medications as prescribed.   Past Surgical History:  Procedure Laterality Date  . CHOLECYSTECTOMY  2011  . EYE SURGERY     Sty removal  . LABIAL ADHESION LYSIS  1999  . 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 TONSILLECTOMY  2012    Family History  Problem Relation Age of Onset  . Arthritis Other        grandparent  . Stroke Other   . Hypertension Other   . Diabetes Other        grandparent  . Cancer - Other Other        Glioblastoma    Social History   Socioeconomic History  . Marital status: Single    Spouse name: Not on file  . Number of children: Not on file  . Years of education: Not on file  . Highest education level: Not on file  Occupational History  . Not on file  Tobacco Use  . Smoking status: Former Research scientist (life sciences)  . Smokeless tobacco: Never Used  Vaping Use  . Vaping Use: Never used  Substance and Sexual Activity  . Alcohol use: Yes    Alcohol/week: 0.0 standard  drinks    Comment: occ  . Drug use: No  . Sexual activity: Yes  Other Topics Concern  . Not on file  Social History Narrative   Lives with mom in a one story home.  Education: 2 years of college.    Social Determinants of Health   Financial Resource Strain:   . Difficulty of Paying Living Expenses:   Food Insecurity:   . Worried About Charity fundraiser in the Last Year:   . Arboriculturist in the Last Year:   Transportation Needs:   . Film/video editor (Medical):   Marland Kitchen Lack of Transportation (Non-Medical):   Physical Activity:   . Days of Exercise per Week:   . Minutes of Exercise per Session:   Stress:   . Feeling of Stress :   Social Connections:   . Frequency of Communication  with Friends and Family:   . Frequency of Social Gatherings with Friends and Family:   . Attends Religious Services:   . Active Member of Clubs or Organizations:   . Attends Archivist Meetings:   Marland Kitchen Marital Status:   Intimate Partner Violence:   . Fear of Current or Ex-Partner:   . Emotionally Abused:   Marland Kitchen Physically Abused:   . Sexually Abused:     Outpatient Medications Prior to Visit  Medication Sig Dispense Refill  . hydroxyurea (HYDREA) 500 MG capsule TAKE 3 CAPSULES (1,500 MG TOTAL) BY MOUTH DAILY. 90 capsule 6  . ondansetron (ZOFRAN) 4 MG tablet Take 1 tablet (4 mg total) by mouth every 8 (eight) hours as needed for nausea or vomiting. 20 tablet 5  . oxyCODONE (OXYCONTIN) 10 mg 12 hr tablet Take 1 tablet (10 mg total) by mouth every 12 (twelve) hours. 60 tablet 0  . oxyCODONE (ROXICODONE) 15 MG immediate release tablet Take 1 tablet (15 mg total) by mouth every 4 (four) hours as needed for pain. 60 tablet 0  . megestrol (MEGACE) 20 MG tablet TAKE 1 TABLET BY MOUTH EVERY DAY 30 tablet 1  . doxylamine, Sleep, (UNISOM) 25 MG tablet Take 25 mg by mouth at bedtime as needed for sleep.  (Patient not taking: Reported on 02/09/2020)    . folic acid (FOLVITE) 1 MG tablet Take 1 tablet (1 mg total) by mouth daily. (Patient not taking: Reported on 03/08/2020) 90 tablet 3  . ibuprofen (ADVIL) 800 MG tablet Take 1 tablet (800 mg total) by mouth every 8 (eight) hours as needed. (Patient not taking: Reported on 03/08/2020) 60 tablet 6  . sertraline (ZOLOFT) 25 MG tablet Take 25 mg by mouth daily.  (Patient not taking: Reported on 03/08/2020)    . tiZANidine (ZANAFLEX) 4 MG capsule Take 1 capsule (4 mg total) by mouth 3 (three) times daily as needed for muscle spasms. (Patient not taking: Reported on 03/08/2020) 90 capsule 6  . Vitamin D, Ergocalciferol, (DRISDOL) 1.25 MG (50000 UNIT) CAPS capsule Take 1 capsule (50,000 Units total) by mouth every Saturday. Saturday. (Patient not taking: Reported on  03/08/2020) 5 capsule 5   No facility-administered medications prior to visit.    Allergies  Allergen Reactions  . Latex Rash    ROS Review of Systems  Constitutional: Negative.   HENT: Negative.   Eyes: Negative.   Respiratory: Negative.   Cardiovascular: Negative.   Gastrointestinal: Positive for constipation (occasional ) and nausea (occasional ).  Endocrine: Negative.   Genitourinary: Negative.   Musculoskeletal: Positive for arthralgias (generalized joint pain).  Skin:  Negative.   Allergic/Immunologic: Negative.   Neurological: Positive for dizziness (occasional ) and headaches (occasional ).  Hematological: Negative.   Psychiatric/Behavioral: Negative.       Objective:    Physical Exam Vitals and nursing note reviewed.  Constitutional:      Appearance: Normal appearance.  HENT:     Head: Normocephalic and atraumatic.     Nose: Nose normal.     Mouth/Throat:     Mouth: Mucous membranes are moist.     Pharynx: Oropharynx is clear.  Cardiovascular:     Rate and Rhythm: Normal rate and regular rhythm.  Pulmonary:     Effort: Pulmonary effort is normal.     Breath sounds: Normal breath sounds.  Abdominal:     General: Bowel sounds are normal.     Palpations: Abdomen is soft.  Musculoskeletal:        General: Normal range of motion.     Cervical back: Normal range of motion and neck supple.  Skin:    General: Skin is warm.  Neurological:     General: No focal deficit present.     Mental Status: She is alert and oriented to person, place, and time.  Psychiatric:        Mood and Affect: Mood normal.        Behavior: Behavior normal.        Thought Content: Thought content normal.        Judgment: Judgment normal.     BP (!) 105/56 (BP Location: Right Arm, Patient Position: Sitting, Cuff Size: Normal)   Pulse 87   Temp 99.2 F (37.3 C) (Oral)   Resp 16   Ht 5\' 3"  (1.6 m)   Wt 114 lb (51.7 kg)   LMP 03/05/2020   BMI 20.19 kg/m  Wt Readings from  Last 3 Encounters:  03/08/20 114 lb (51.7 kg)  09/13/19 119 lb 3.2 oz (54.1 kg)  08/28/19 123 lb 6.4 oz (56 kg)     Health Maintenance Due  Topic Date Due  . Hepatitis C Screening  Never done  . COVID-19 Vaccine (1) Never done  . PAP-Cervical Cytology Screening  Never done  . PAP SMEAR-Modifier  09/01/2017    There are no preventive care reminders to display for this patient.  No results found for: TSH Lab Results  Component Value Date   WBC 11.5 (H) 01/23/2020   HGB 7.6 (L) 01/23/2020   HCT 21.7 (L) 01/23/2020   MCV 112.4 (H) 01/23/2020   PLT 446 (H) 01/23/2020   Lab Results  Component Value Date   NA 138 01/23/2020   K 4.0 01/23/2020   CO2 19 (L) 01/23/2020   GLUCOSE 84 01/23/2020   BUN 11 01/23/2020   CREATININE 0.37 (L) 01/23/2020   BILITOT 4.2 (H) 01/23/2020   ALKPHOS 64 01/23/2020   AST 34 01/23/2020   ALT 21 01/23/2020   PROT 7.6 01/23/2020   ALBUMIN 5.1 (H) 01/23/2020   CALCIUM 9.6 01/23/2020   ANIONGAP 11 01/23/2020   No results found for: CHOL No results found for: HDL No results found for: LDLCALC No results found for: TRIG No results found for: CHOLHDL No results found for: HGBA1C    Assessment & Plan:   1. Hb-SS disease without crisis [facility]) She is doing well today r/t her chronic pain management. She will continue to take pain medications as prescribed; will continue to avoid extreme heat and cold; will continue to eat a healthy diet and drink at least  64 ounces of water daily; continue stool softener as needed; will avoid colds and flu; will continue to get plenty of sleep and rest; will continue to avoid high stressful situations and remain infection free; will continue Folic Acid 1 mg daily to avoid sickle cell crisis. Continue to follow up with Hematologist as needed.  - Urinalysis Dipstick  2. Chronic prescription opiate use  3. Chronic pain syndrome  4. Acute pain of right knee - DG Knee Complete 4 Views Right; Future  5. Decreased  appetite - megestrol (MEGACE) 40 MG tablet; Take 1 tablet (40 mg total) by mouth daily.  Dispense: 30 tablet; Refill: 11  6. Follow up She will follow up in 2 months.   Meds ordered this encounter  Medications  . megestrol (MEGACE) 40 MG tablet    Sig: Take 1 tablet (40 mg total) by mouth daily.    Dispense:  30 tablet    Refill:  11    Orders Placed This Encounter  Procedures  . DG Knee Complete 4 Views Right  . Urinalysis Dipstick    Referral Orders  No referral(s) requested today    [name],  MSN, FNP-BC [place], [place] [address] [phone] fax  Problem List Items Addressed This Visit      Other   Sickle cell anemia ([place]) - Primary   Relevant Orders   Urinalysis Dipstick (Completed)    Other Visit Diagnoses    Chronic prescription opiate use       Chronic pain syndrome       Acute pain of right knee       Relevant Orders   DG Knee Complete 4 Views Right   Decreased appetite       Relevant Medications   megestrol (MEGACE) 40 MG tablet   Follow up          Meds ordered this encounter  Medications  . megestrol (MEGACE) 40 MG tablet    Sig: Take 1 tablet (40 mg total) by mouth daily.    Dispense:  30 tablet    Refill:  11    Follow-up: Return in about 2 months (around 05/08/2020).    Azzie Glatter, FNP

## 2020-03-11 NOTE — Telephone Encounter (Signed)
Done

## 2020-03-14 ENCOUNTER — Encounter: Payer: Self-pay | Admitting: Family Medicine

## 2020-03-19 ENCOUNTER — Other Ambulatory Visit: Payer: Self-pay | Admitting: Family Medicine

## 2020-03-19 ENCOUNTER — Telehealth: Payer: Self-pay | Admitting: Family Medicine

## 2020-03-19 DIAGNOSIS — Z79891 Long term (current) use of opiate analgesic: Secondary | ICD-10-CM

## 2020-03-19 DIAGNOSIS — D571 Sickle-cell disease without crisis: Secondary | ICD-10-CM

## 2020-03-19 DIAGNOSIS — G894 Chronic pain syndrome: Secondary | ICD-10-CM

## 2020-03-19 MED ORDER — OXYCODONE HCL 15 MG PO TABS: 15.0000 mg | ORAL_TABLET | ORAL | 0 refills | Status: DC | PRN

## 2020-03-20 NOTE — Telephone Encounter (Signed)
DOne

## 2020-03-29 ENCOUNTER — Other Ambulatory Visit: Payer: Self-pay | Admitting: Family Medicine

## 2020-03-29 DIAGNOSIS — D571 Sickle-cell disease without crisis: Secondary | ICD-10-CM

## 2020-03-29 DIAGNOSIS — R630 Anorexia: Secondary | ICD-10-CM

## 2020-04-02 ENCOUNTER — Telehealth: Payer: Self-pay | Admitting: Family Medicine

## 2020-04-02 ENCOUNTER — Other Ambulatory Visit: Payer: Self-pay | Admitting: Family Medicine

## 2020-04-02 DIAGNOSIS — G894 Chronic pain syndrome: Secondary | ICD-10-CM

## 2020-04-02 DIAGNOSIS — Z79891 Long term (current) use of opiate analgesic: Secondary | ICD-10-CM

## 2020-04-02 DIAGNOSIS — D571 Sickle-cell disease without crisis: Secondary | ICD-10-CM

## 2020-04-02 DIAGNOSIS — R630 Anorexia: Secondary | ICD-10-CM

## 2020-04-02 MED ORDER — OXYCODONE HCL 15 MG PO TABS: 15.0000 mg | ORAL_TABLET | ORAL | 0 refills | Status: DC | PRN

## 2020-04-02 MED ORDER — MEGESTROL ACETATE 40 MG PO TABS: 40.0000 mg | ORAL_TABLET | Freq: Every day | ORAL | 11 refills | Status: DC

## 2020-04-03 NOTE — Telephone Encounter (Signed)
Done

## 2020-04-15 ENCOUNTER — Telehealth: Payer: Self-pay | Admitting: Family Medicine

## 2020-04-16 ENCOUNTER — Other Ambulatory Visit: Payer: Self-pay | Admitting: Family Medicine

## 2020-04-16 DIAGNOSIS — Z79891 Long term (current) use of opiate analgesic: Secondary | ICD-10-CM

## 2020-04-16 DIAGNOSIS — G894 Chronic pain syndrome: Secondary | ICD-10-CM

## 2020-04-16 DIAGNOSIS — D571 Sickle-cell disease without crisis: Secondary | ICD-10-CM

## 2020-04-16 MED ORDER — OXYCODONE HCL 15 MG PO TABS: 15.0000 mg | ORAL_TABLET | ORAL | 0 refills | Status: DC | PRN

## 2020-04-17 NOTE — Telephone Encounter (Signed)
Sent to provider 

## 2020-05-01 ENCOUNTER — Telehealth: Payer: Self-pay | Admitting: Family Medicine

## 2020-05-01 ENCOUNTER — Other Ambulatory Visit: Payer: Self-pay | Admitting: Family Medicine

## 2020-05-01 DIAGNOSIS — G894 Chronic pain syndrome: Secondary | ICD-10-CM

## 2020-05-01 DIAGNOSIS — Z79891 Long term (current) use of opiate analgesic: Secondary | ICD-10-CM

## 2020-05-01 DIAGNOSIS — D571 Sickle-cell disease without crisis: Secondary | ICD-10-CM

## 2020-05-01 MED ORDER — OXYCODONE HCL 15 MG PO TABS: 15.0000 mg | ORAL_TABLET | ORAL | 0 refills | Status: DC | PRN

## 2020-05-02 NOTE — Telephone Encounter (Signed)
Done

## 2020-05-02 NOTE — Telephone Encounter (Signed)
DOne

## 2020-05-08 ENCOUNTER — Telehealth: Payer: Self-pay | Admitting: Family Medicine

## 2020-05-08 ENCOUNTER — Telehealth (INDEPENDENT_AMBULATORY_CARE_PROVIDER_SITE_OTHER): Payer: Medicaid Other | Admitting: Family Medicine

## 2020-05-08 DIAGNOSIS — D571 Sickle-cell disease without crisis: Secondary | ICD-10-CM | POA: Diagnosis not present

## 2020-05-08 DIAGNOSIS — Z79891 Long term (current) use of opiate analgesic: Secondary | ICD-10-CM | POA: Diagnosis not present

## 2020-05-08 DIAGNOSIS — Z9289 Personal history of other medical treatment: Secondary | ICD-10-CM | POA: Diagnosis not present

## 2020-05-08 DIAGNOSIS — Z09 Encounter for follow-up examination after completed treatment for conditions other than malignant neoplasm: Secondary | ICD-10-CM

## 2020-05-08 DIAGNOSIS — G894 Chronic pain syndrome: Secondary | ICD-10-CM

## 2020-05-08 NOTE — Telephone Encounter (Signed)
error 

## 2020-05-08 NOTE — Progress Notes (Signed)
Virtual Visit via Telephone Note  I connected with Sandra Mckay on 05/14/20 at  3:00 PM EDT by telephone and verified that I am speaking with the correct person using two identifiers.   I discussed the limitations, risks, security and privacy concerns of performing an evaluation and management service by telephone and the availability of in person appointments. I also discussed with the patient that there may be a patient responsible charge related to this service. The patient expressed understanding and agreed to proceed.  Televisit Today Patient Location: Home Provider Location: Office   History of Present Illness:  Patient Active Problem List   Diagnosis Date Noted  . Medication management 12/12/2018  . Bipolar and related disorder ([place]) 04/04/2017  . Sickle cell pain crisis ([place]) 12/05/2016  . Sickle cell anemia with crisis ([place]) 12/05/2016  . Vasovagal syncope 09/18/2016  . Closed fracture of lumbar vertebra without spinal cord injury ([place]) 07/15/2016  . Nausea without vomiting 09/09/2015  . Vitamin D deficiency 07/22/2015  . Sickle cell anemia ([place]) 06/08/2015   Current Status: Since her last office visit, she has had an ED for a Blood Transfusion on 04/30/2020 for Hgb at 5.6. Today, she is doing well with no complaints. Shestates that she has pain in her arms and legs. She rates her pain today at 5/10. She has not had a hospital visit for Sickle Cell Crisis since 03/20/2020 where she was treated and discharged the same day. She is currently taking all medications as prescribed and staying well hydrated. She reports occasional nausea, constipation, dizziness and headaches. Her anxiety is moderate today r/t pain juggling avoiding pain crisis, raising her toddler, and her job. She states that she has not been approved for Oxy-ER as of yet. She denies fevers, chills, recent infections, weight loss, and night sweats. She has not had any visual changes, and falls. No chest pain, heart  palpitations, cough and shortness of breath reported. Denies GI problems such as vomiting, and diarrhea. She has no reports of blood in stools, dysuria and hematuria. She is taking all medications as prescribed.   Observations/Objective:  Telephone Visit   Assessment and Plan:  1. Hospital discharge follow-up  2. History of recent blood transfusion  3. Hb-SS disease without crisis [facility]) She is doing well today r/t her chronic pain management. She will continue to take pain medications as prescribed; will continue to avoid extreme heat and cold; will continue to eat a healthy diet and drink at least 64 ounces of water daily; continue stool softener as needed; will avoid colds and flu; will continue to get plenty of sleep and rest; will continue to avoid high stressful situations and remain infection free; will continue Folic Acid 1 mg daily to avoid sickle cell crisis. Continue to follow up with Hematologist as needed.   4. Chronic prescription opiate use  5. Chronic pain syndrome  6. Follow up She will follow up in 2 months.   No orders of the defined types were placed in this encounter.   No orders of the defined types were placed in this encounter.   Referral Orders  No referral(s) requested today    [name],  MSN, FNP-BC [place] [address] [place], [place] [address] [phone]- fax     I discussed the assessment and treatment plan with the patient. The patient was provided an opportunity to ask questions and all were answered. The patient agreed with  the plan and demonstrated an understanding of the instructions.   The patient was advised to call back or seek an in-person evaluation if the symptoms worsen or if the condition fails to improve as anticipated.  I provided 20 minutes of non-face-to-face time during this encounter.   Azzie Glatter,  FNP

## 2020-05-09 ENCOUNTER — Other Ambulatory Visit: Payer: Self-pay | Admitting: Family Medicine

## 2020-05-09 DIAGNOSIS — D571 Sickle-cell disease without crisis: Secondary | ICD-10-CM

## 2020-05-09 DIAGNOSIS — G894 Chronic pain syndrome: Secondary | ICD-10-CM

## 2020-05-09 DIAGNOSIS — Z79891 Long term (current) use of opiate analgesic: Secondary | ICD-10-CM

## 2020-05-09 MED ORDER — XTAMPZA ER 9 MG PO C12A: 9.0000 mg | EXTENDED_RELEASE_CAPSULE | Freq: Two times a day (BID) | ORAL | 0 refills | Status: DC

## 2020-05-10 ENCOUNTER — Ambulatory Visit: Payer: Medicaid Other | Admitting: Family Medicine

## 2020-05-13 NOTE — Telephone Encounter (Signed)
Sent to provider 

## 2020-05-14 ENCOUNTER — Telehealth: Payer: Self-pay | Admitting: Family Medicine

## 2020-05-14 ENCOUNTER — Other Ambulatory Visit: Payer: Self-pay | Admitting: Family Medicine

## 2020-05-14 ENCOUNTER — Encounter: Payer: Self-pay | Admitting: Family Medicine

## 2020-05-14 DIAGNOSIS — D571 Sickle-cell disease without crisis: Secondary | ICD-10-CM

## 2020-05-14 DIAGNOSIS — G894 Chronic pain syndrome: Secondary | ICD-10-CM

## 2020-05-14 DIAGNOSIS — Z79891 Long term (current) use of opiate analgesic: Secondary | ICD-10-CM

## 2020-05-14 MED ORDER — OXYCODONE HCL 15 MG PO TABS: 15.0000 mg | ORAL_TABLET | ORAL | 0 refills | Status: DC | PRN

## 2020-05-14 NOTE — Telephone Encounter (Signed)
Done

## 2020-05-27 ENCOUNTER — Telehealth: Payer: Self-pay | Admitting: Family Medicine

## 2020-05-28 ENCOUNTER — Other Ambulatory Visit: Payer: Self-pay | Admitting: Nurse Practitioner

## 2020-05-28 DIAGNOSIS — D571 Sickle-cell disease without crisis: Secondary | ICD-10-CM

## 2020-05-28 DIAGNOSIS — Z79891 Long term (current) use of opiate analgesic: Secondary | ICD-10-CM

## 2020-05-28 DIAGNOSIS — G894 Chronic pain syndrome: Secondary | ICD-10-CM

## 2020-05-28 MED ORDER — OXYCODONE HCL 15 MG PO TABS: 15.0000 mg | ORAL_TABLET | ORAL | 0 refills | Status: DC | PRN

## 2020-05-28 NOTE — Progress Notes (Signed)
   [place], [place]  [address] Phone:  [phone]   Fax:  [phone] PDMP reviewed during this encounter.Subjective:    [name] calls for refill of her Oxycodone 15 mg.    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

## 2020-05-28 NOTE — Telephone Encounter (Signed)
sent 

## 2020-06-11 ENCOUNTER — Telehealth: Payer: Self-pay | Admitting: Family Medicine

## 2020-06-11 ENCOUNTER — Ambulatory Visit: Payer: Medicaid Other | Admitting: Family Medicine

## 2020-06-11 ENCOUNTER — Other Ambulatory Visit: Payer: Self-pay | Admitting: Family Medicine

## 2020-06-11 DIAGNOSIS — G894 Chronic pain syndrome: Secondary | ICD-10-CM

## 2020-06-11 DIAGNOSIS — D571 Sickle-cell disease without crisis: Secondary | ICD-10-CM

## 2020-06-11 DIAGNOSIS — Z79891 Long term (current) use of opiate analgesic: Secondary | ICD-10-CM

## 2020-06-11 MED ORDER — OXYCODONE HCL 15 MG PO TABS: 15.0000 mg | ORAL_TABLET | ORAL | 0 refills | Status: DC | PRN

## 2020-06-11 NOTE — Telephone Encounter (Signed)
error 

## 2020-06-12 NOTE — Telephone Encounter (Signed)
Sent to provider 

## 2020-06-25 ENCOUNTER — Telehealth (HOSPITAL_COMMUNITY): Payer: Self-pay | Admitting: General Practice

## 2020-06-25 ENCOUNTER — Other Ambulatory Visit: Payer: Self-pay | Admitting: Family Medicine

## 2020-06-25 ENCOUNTER — Telehealth: Payer: Self-pay | Admitting: Family Medicine

## 2020-06-25 DIAGNOSIS — G894 Chronic pain syndrome: Secondary | ICD-10-CM

## 2020-06-25 DIAGNOSIS — D571 Sickle-cell disease without crisis: Secondary | ICD-10-CM

## 2020-06-25 DIAGNOSIS — Z79891 Long term (current) use of opiate analgesic: Secondary | ICD-10-CM

## 2020-06-25 MED ORDER — OXYCODONE HCL 15 MG PO TABS: 15.0000 mg | ORAL_TABLET | ORAL | 0 refills | Status: DC | PRN

## 2020-06-25 NOTE — Telephone Encounter (Signed)
Patient called, requesting to come to the day hospital due to pain in the legs and lower back rated at 8/10. Denied fever, diarrhea, abdominal pain, nausea/vomitting. Endorsed "unusual chest pain". Screened negative for Covid-19 symptoms. Admitted to having means of transportation without driving self after treatment. Last took 15 mg of oxycodone at 07:00 am today. Per provider, due to the "usual chest pain", patient should go to the ER. Patient notified, verbalized understanding.

## 2020-06-25 NOTE — Telephone Encounter (Signed)
Once you fill please sign the encounter

## 2020-07-01 ENCOUNTER — Telehealth (INDEPENDENT_AMBULATORY_CARE_PROVIDER_SITE_OTHER): Payer: Medicaid Other | Admitting: Family Medicine

## 2020-07-01 ENCOUNTER — Encounter: Payer: Self-pay | Admitting: Family Medicine

## 2020-07-01 DIAGNOSIS — Z09 Encounter for follow-up examination after completed treatment for conditions other than malignant neoplasm: Secondary | ICD-10-CM | POA: Diagnosis not present

## 2020-07-01 DIAGNOSIS — D5700 Hb-SS disease with crisis, unspecified: Secondary | ICD-10-CM

## 2020-07-01 DIAGNOSIS — G894 Chronic pain syndrome: Secondary | ICD-10-CM

## 2020-07-01 DIAGNOSIS — Z79891 Long term (current) use of opiate analgesic: Secondary | ICD-10-CM

## 2020-07-01 DIAGNOSIS — D571 Sickle-cell disease without crisis: Secondary | ICD-10-CM | POA: Diagnosis not present

## 2020-07-01 NOTE — Progress Notes (Signed)
Virtual Visit via Telephone Note  I connected with Sandra Mckay on 07/01/20 at 11:00 AM EST by telephone and verified that I am speaking with the correct person using two identifiers.  Location: Patient: Home Provider: Office   I discussed the limitations, risks, security and privacy concerns of performing an evaluation and management service by telephone and the availability of in person appointments. I also discussed with the patient that there may be a patient responsible charge related to this service. The patient expressed understanding and agreed to proceed.   History of Present Illness:  Patient Active Problem List   Diagnosis Date Noted  . Medication management 12/12/2018  . Bipolar and related disorder ([place]) 04/04/2017  . Sickle cell pain crisis ([place]) 12/05/2016  . Sickle cell anemia with crisis ([place]) 12/05/2016  . Vasovagal syncope 09/18/2016  . Closed fracture of lumbar vertebra without spinal cord injury ([place]) 07/15/2016  . Nausea without vomiting 09/09/2015  . Vitamin D deficiency 07/22/2015  . Sickle cell anemia ([place]) 06/08/2015    Current Status: Since her last office visit, she is doing well with no complaints. She states that she has pain in her lower back and legs. She rates her pain today at 7-8/10. She has not had a hospital visit for Sickle Cell Crisis since 04/30/2020 where she was treated and discharged on 05/01/2020. She is currently taking all medications as prescribed and attempting to drink 64 oz of water daily, but feels that she is dehydrated. She reports occasional nausea, constipation, dizziness and headaches. She denies fevers, chills, fatigue, recent infections, weight loss, and night sweats. She has not had any visual changes, dand falls. No chest pain, heart palpitations, cough and shortness of breath raeported. Denies GI problems such as vomiting, and diarrhea. She has no reports of blood in stools, dysuria and hematuria. No depression or anxiety  reported today She is taking all medications as prescribed. She states that she is paying out of pocket ($50) because insurance is not covering anymore. She also states that dosage is not easing her pain. Her appetite is decreased and she is currently eating 1 meal a day with snacking throughout the day.     Observations/Objective:  Telephone Visit   Assessment and Plan:  1. Hospital discharge follow-up She reports that she is not doing well today r/t her chronic pain management. She is requesting IVFs and admittance to [facility]. She will continue to take pain medications as prescribed; will continue to avoid extreme heat and cold; will continue to eat a healthy diet and drink at least 64 ounces of water daily; continue stool softener as needed; will avoid colds and flu; will continue to get plenty of sleep and rest; will continue to avoid high stressful situations and remain infection free; will continue Folic Acid 1 mg daily to avoid sickle cell crisis. Continue to follow up with Hematologist as needed.   2. Hb-SS disease without crisis ([place])  3. Chronic prescription opiate use  4. Chronic pain syndrome  5. Follow up She will follow up in 1 month.   No orders of the defined types were placed in this encounter.   No orders of the defined types were placed in this encounter.   Referral Orders  No referral(s) requested today    [name],  MSN, FNP-BC [facility] [address] [place], [place] [address] [phone]- fax     I discussed the  assessment and treatment plan with the patient. The patient was provided an opportunity to ask questions and all were answered. The patient agreed with the plan and demonstrated an understanding of the instructions.   The patient was advised to call back or seek an in-person evaluation if the symptoms worsen or if the  condition fails to improve as anticipated.  I provided 20 minutes of non-face-to-face time during this encounter.   Azzie Glatter, FNP

## 2020-07-08 ENCOUNTER — Other Ambulatory Visit: Payer: Self-pay | Admitting: Family Medicine

## 2020-07-08 ENCOUNTER — Telehealth: Payer: Self-pay | Admitting: Family Medicine

## 2020-07-08 DIAGNOSIS — D571 Sickle-cell disease without crisis: Secondary | ICD-10-CM

## 2020-07-08 DIAGNOSIS — Z79891 Long term (current) use of opiate analgesic: Secondary | ICD-10-CM

## 2020-07-08 DIAGNOSIS — G894 Chronic pain syndrome: Secondary | ICD-10-CM

## 2020-07-08 MED ORDER — XTAMPZA ER 9 MG PO C12A: 9.0000 mg | EXTENDED_RELEASE_CAPSULE | Freq: Two times a day (BID) | ORAL | 0 refills | Status: DC

## 2020-07-08 MED ORDER — OXYCODONE HCL 15 MG PO TABS: 15.0000 mg | ORAL_TABLET | ORAL | 0 refills | Status: DC | PRN

## 2020-07-08 NOTE — Telephone Encounter (Signed)
Done

## 2020-07-09 ENCOUNTER — Telehealth: Payer: Self-pay | Admitting: Family Medicine

## 2020-07-09 NOTE — Telephone Encounter (Signed)
Done

## 2020-07-10 ENCOUNTER — Telehealth: Payer: Self-pay | Admitting: Family Medicine

## 2020-07-10 NOTE — Telephone Encounter (Signed)
Spoke with patient today. 

## 2020-07-23 ENCOUNTER — Other Ambulatory Visit: Payer: Self-pay | Admitting: Family Medicine

## 2020-07-23 ENCOUNTER — Telehealth: Payer: Self-pay | Admitting: Family Medicine

## 2020-07-23 DIAGNOSIS — G894 Chronic pain syndrome: Secondary | ICD-10-CM

## 2020-07-23 DIAGNOSIS — Z79891 Long term (current) use of opiate analgesic: Secondary | ICD-10-CM

## 2020-07-23 DIAGNOSIS — D571 Sickle-cell disease without crisis: Secondary | ICD-10-CM

## 2020-07-23 MED ORDER — OXYCODONE HCL 15 MG PO TABS: 15.0000 mg | ORAL_TABLET | ORAL | 0 refills | Status: DC | PRN

## 2020-07-23 NOTE — Telephone Encounter (Signed)
Done

## 2020-08-06 ENCOUNTER — Encounter: Payer: Self-pay | Admitting: Family Medicine

## 2020-08-06 ENCOUNTER — Other Ambulatory Visit: Payer: Self-pay

## 2020-08-06 ENCOUNTER — Ambulatory Visit (INDEPENDENT_AMBULATORY_CARE_PROVIDER_SITE_OTHER): Payer: Medicaid Other | Admitting: Family Medicine

## 2020-08-06 VITALS — BP 116/69 | HR 74 | Temp 97.9°F | Ht 63.0 in | Wt 117.0 lb

## 2020-08-06 DIAGNOSIS — Z79899 Other long term (current) drug therapy: Secondary | ICD-10-CM

## 2020-08-06 DIAGNOSIS — F419 Anxiety disorder, unspecified: Secondary | ICD-10-CM

## 2020-08-06 DIAGNOSIS — Z3009 Encounter for other general counseling and advice on contraception: Secondary | ICD-10-CM

## 2020-08-06 DIAGNOSIS — D571 Sickle-cell disease without crisis: Secondary | ICD-10-CM | POA: Diagnosis not present

## 2020-08-06 DIAGNOSIS — G894 Chronic pain syndrome: Secondary | ICD-10-CM | POA: Diagnosis not present

## 2020-08-06 DIAGNOSIS — Z79891 Long term (current) use of opiate analgesic: Secondary | ICD-10-CM

## 2020-08-06 DIAGNOSIS — Z30013 Encounter for initial prescription of injectable contraceptive: Secondary | ICD-10-CM

## 2020-08-06 DIAGNOSIS — Z09 Encounter for follow-up examination after completed treatment for conditions other than malignant neoplasm: Secondary | ICD-10-CM

## 2020-08-06 LAB — POCT URINALYSIS DIPSTICK
Bilirubin, UA: NEGATIVE
Blood, UA: NEGATIVE
Glucose, UA: NEGATIVE
Ketones, UA: NEGATIVE
Leukocytes, UA: NEGATIVE
Nitrite, UA: NEGATIVE
Protein, UA: NEGATIVE
Spec Grav, UA: 1.02 (ref 1.010–1.025)
Urobilinogen, UA: 0.2 E.U./dL
pH, UA: 5.5 (ref 5.0–8.0)

## 2020-08-06 LAB — POCT URINE PREGNANCY: Preg Test, Ur: NEGATIVE

## 2020-08-06 MED ORDER — MEDROXYPROGESTERONE ACETATE 150 MG/ML IM SUSP
150.0000 mg | Freq: Once | INTRAMUSCULAR | Status: AC
  Administered 2020-08-06: 150 mg via INTRAMUSCULAR

## 2020-08-06 MED ORDER — XTAMPZA ER 9 MG PO C12A: 9.0000 mg | EXTENDED_RELEASE_CAPSULE | Freq: Two times a day (BID) | ORAL | 0 refills | Status: DC

## 2020-08-06 MED ORDER — OXYCODONE HCL 15 MG PO TABS: 15.0000 mg | ORAL_TABLET | ORAL | 0 refills | Status: DC | PRN

## 2020-08-06 NOTE — Progress Notes (Signed)
Patient [facility] Follow Up  Subjective:  Patient ID: Sandra Mckay, female    DOB: 1996/01/25  Age: 25 y.o. MRN: [id number]  CC:  Chief Complaint  Patient presents with  . Follow-up    1 month follow up     HPI Sandra Mckay is a 25 year old who presents for Follow Up today.    Patient Active Problem List   Diagnosis Date Noted  . Medication management 12/12/2018  . Bipolar and related disorder ([place]) 04/04/2017  . Sickle cell pain crisis ([place]) 12/05/2016  . Sickle cell anemia with crisis ([place]) 12/05/2016  . Vasovagal syncope 09/18/2016  . Closed fracture of lumbar vertebra without spinal cord injury ([place]) 07/15/2016  . Nausea without vomiting 09/09/2015  . Vitamin D deficiency 07/22/2015  . Sickle cell anemia ([place]) 06/08/2015    Current Status: Since her last office visit, she is doing well with no complaints.  She recently took Plan-B on 07/02/2020. She did experience increased bleeding, which her last menstrual period was on  07/22/2020 and was back to normal. She has c/o decreased appetite, which she takes Megace. She denies fevers, chills, fatigue, recent infections, weight loss, and night sweats. She has not had any visual changes, and falls. No chest pain, heart palpitations, cough and shortness of breath reported. Denies GI problems such as vomiting and diarrhea.  She has no reports of blood in stools, dysuria and hematuria. No depression or anxiety, and denies suicidal ideations, homicidal ideations, or auditory hallucinations. She is taking all medications as prescribed.   Past Medical History:  Diagnosis Date  . Blood transfusion without reported diagnosis   . Bronchitis   . Chickenpox   . Depression   . Elevated ferritin level 05/2019  . Heart murmur   . Pulmonary hypertension ([place])   . Sickle cell anemia (H[facility])   . 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 Thrombocytosis 05/2019  . Urinary tract  infection   . Vitamin D deficiency     Past Surgical History:  Procedure Laterality Date  . CHOLECYSTECTOMY  2011  . EYE SURGERY     Sty removal  . LABIAL ADHESION LYSIS  1999  . 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 TONSILLECTOMY  2012    Family History  Problem Relation Age of Onset  . Arthritis Other        grandparent  . Stroke Other   . Hypertension Other   . Diabetes Other        grandparent  . Cancer - Other Other        Glioblastoma    Social History   Socioeconomic History  . Marital status: Single    Spouse name: Not on file  . Number of children: Not on file  . Years of education: Not on file  . Highest education level: Not on file  Occupational History  . Not on file  Tobacco Use  . Smoking status: Former Research scientist (life sciences)  . Smokeless tobacco: Never Used  Vaping Use  . Vaping Use: Never used  Substance and Sexual Activity  . Alcohol use: Yes    Alcohol/week: 0.0 standard drinks    Comment: occ  . Drug use: No  . Sexual activity: Yes  Other Topics Concern  . Not on file  Social History Narrative   Lives with mom in a one story home.  Education: 2 years  of college.    Social Determinants of Health   Financial Resource Strain: Not on file  Food Insecurity: Not on file  Transportation Needs: Not on file  Physical Activity: Not on file  Stress: Not on file  Social Connections: Not on file  Intimate Partner Violence: Not on file    Outpatient Medications Prior to Visit  Medication Sig Dispense Refill  . doxylamine, Sleep, (UNISOM) 25 MG tablet Take 25 mg by mouth at bedtime as needed for sleep.    . folic acid (FOLVITE) 1 MG tablet Take 1 tablet (1 mg total) by mouth daily. 90 tablet 3  . hydroxyurea (HYDREA) 500 MG capsule TAKE 3 CAPSULES (1,500 MG TOTAL) BY MOUTH DAILY. 90 capsule 6  . ibuprofen (ADVIL) 800 MG tablet Take 1 tablet (800 mg total) by mouth every 8 (eight) hours as needed. 60 tablet 6  . megestrol  (MEGACE) 40 MG tablet Take 1 tablet (40 mg total) by mouth daily. 30 tablet 11  . ondansetron (ZOFRAN) 4 MG tablet Take 1 tablet (4 mg total) by mouth every 8 (eight) hours as needed for nausea or vomiting. 20 tablet 5  . sertraline (ZOLOFT) 25 MG tablet Take 25 mg by mouth daily.    Marland Kitchen tiZANidine (ZANAFLEX) 4 MG capsule Take 1 capsule (4 mg total) by mouth 3 (three) times daily as needed for muscle spasms. 90 capsule 6  . Vitamin D, Ergocalciferol, (DRISDOL) 1.25 MG (50000 UNIT) CAPS capsule Take 1 capsule (50,000 Units total) by mouth every Saturday. Saturday. 5 capsule 5  . oxyCODONE (ROXICODONE) 15 MG immediate release tablet Take 1 tablet (15 mg total) by mouth every 4 (four) hours as needed for up to 15 days for pain. 90 tablet 0  . oxyCODONE ER (XTAMPZA ER) 9 MG C12A Take 9 mg by mouth every 12 (twelve) hours. 60 capsule 0   No facility-administered medications prior to visit.    Allergies  Allergen Reactions  . Latex Rash    ROS Review of Systems  Constitutional: Negative.   HENT: Negative.   Eyes: Negative.   Respiratory: Negative.   Cardiovascular: Negative.   Gastrointestinal: Negative.   Endocrine: Negative.   Genitourinary: Negative.   Musculoskeletal: Positive for arthralgias (generalized joint pain).  Skin: Negative.   Allergic/Immunologic: Negative.   Neurological: Positive for dizziness (occasional ) and headaches (occasional ).  Hematological: Negative.   Psychiatric/Behavioral: Negative.       Objective:    Physical Exam Vitals and nursing note reviewed.  Constitutional:      Appearance: Normal appearance.  HENT:     Head: Normocephalic and atraumatic.     Nose: Nose normal.     Mouth/Throat:     Mouth: Mucous membranes are moist.     Pharynx: Oropharynx is clear.  Cardiovascular:     Rate and Rhythm: Normal rate and regular rhythm.     Pulses: Normal pulses.     Heart sounds: Normal heart sounds.  Pulmonary:     Effort: Pulmonary effort is  normal.     Breath sounds: Normal breath sounds.  Abdominal:     General: Bowel sounds are normal.     Palpations: Abdomen is soft.  Musculoskeletal:        General: Normal range of motion.     Cervical back: Normal range of motion and neck supple.  Skin:    General: Skin is warm and dry.  Neurological:     General: No focal deficit present.  Mental Status: She is alert and oriented to person, place, and time.  Psychiatric:        Mood and Affect: Mood normal.        Behavior: Behavior normal.        Thought Content: Thought content normal.        Judgment: Judgment normal.     BP 116/69 (BP Location: Left Arm, Patient Position: Sitting, Cuff Size: Normal)   Pulse 74   Temp 97.9 F (36.6 C) (Temporal)   Ht 5\' 3"  (1.6 m)   Wt 117 lb (53.1 kg)   LMP 07/22/2020   SpO2 98%   BMI 20.73 kg/m  Wt Readings from Last 3 Encounters:  08/06/20 117 lb (53.1 kg)  03/08/20 114 lb (51.7 kg)  09/13/19 119 lb 3.2 oz (54.1 kg)     Health Maintenance Due  Topic Date Due  . Hepatitis C Screening  Never done  . COVID-19 Vaccine (1) Never done  . PAP-Cervical Cytology Screening  Never done  . PAP SMEAR-Modifier  09/01/2017    There are no preventive care reminders to display for this patient.  No results found for: TSH Lab Results  Component Value Date   WBC 11.5 (H) 01/23/2020   HGB 7.6 (L) 01/23/2020   HCT 21.7 (L) 01/23/2020   MCV 112.4 (H) 01/23/2020   PLT 446 (H) 01/23/2020   Lab Results  Component Value Date   NA 138 01/23/2020   K 4.0 01/23/2020   CO2 19 (L) 01/23/2020   GLUCOSE 84 01/23/2020   BUN 11 01/23/2020   CREATININE 0.37 (L) 01/23/2020   BILITOT 4.2 (H) 01/23/2020   ALKPHOS 64 01/23/2020   AST 34 01/23/2020   ALT 21 01/23/2020   PROT 7.6 01/23/2020   ALBUMIN 5.1 (H) 01/23/2020   CALCIUM 9.6 01/23/2020   ANIONGAP 11 01/23/2020   No results found for: CHOL No results found for: HDL No results found for: LDLCALC No results found for: TRIG No  results found for: CHOLHDL No results found for: HGBA1C    Assessment & Plan:   1. Hb-SS disease without crisis [facility]) She is doing well today r/t her chronic pain management. She will continue to take pain medications as prescribed; will continue to avoid extreme heat and cold; will continue to eat a healthy diet and drink at least 64 ounces of water daily; continue stool softener as needed; will avoid colds and flu; will continue to get plenty of sleep and rest; will continue to avoid high stressful situations and remain infection free; will continue Folic Acid 1 mg daily to avoid sickle cell crisis. Continue to follow up with Hematologist as needed.  - oxyCODONE ER (XTAMPZA ER) 9 MG C12A; Take 9 mg by mouth every 12 (twelve) hours.  Dispense: 60 capsule; Refill: 0 - oxyCODONE (ROXICODONE) 15 MG immediate release tablet; Take 1 tablet (15 mg total) by mouth every 4 (four) hours as needed for up to 15 days for pain.  Dispense: 90 tablet; Refill: 0  2. Chronic prescription opiate use - oxyCODONE ER (XTAMPZA ER) 9 MG C12A; Take 9 mg by mouth every 12 (twelve) hours.  Dispense: 60 capsule; Refill: 0 - oxyCODONE (ROXICODONE) 15 MG immediate release tablet; Take 1 tablet (15 mg total) by mouth every 4 (four) hours as needed for up to 15 days for pain.  Dispense: 90 tablet; Refill: 0  3. Chronic pain syndrome - oxyCODONE ER (XTAMPZA ER) 9 MG C12A; Take 9 mg by mouth every  12 (twelve) hours.  Dispense: 60 capsule; Refill: 0 - oxyCODONE (ROXICODONE) 15 MG immediate release tablet; Take 1 tablet (15 mg total) by mouth every 4 (four) hours as needed for up to 15 days for pain.  Dispense: 90 tablet; Refill: 0  4. Anxiety Stable today.   5. Initiation of Depo Provera  6. Counseling for initiation of birth control metho  7. Medication management  8. Follow up She will follow up in 2 months.   Meds ordered this encounter  Medications  . oxyCODONE ER (XTAMPZA ER) 9 MG C12A    Sig: Take 9 mg by  mouth every 12 (twelve) hours.    Dispense:  60 capsule    Refill:  0    Order Specific Question:   Supervising Provider    Answer:   Tresa Garter [id number]  . oxyCODONE (ROXICODONE) 15 MG immediate release tablet    Sig: Take 1 tablet (15 mg total) by mouth every 4 (four) hours as needed for up to 15 days for pain.    Dispense:  90 tablet    Refill:  0    Please do not refill this medication prior to 08/08/2019. Thank you.    Order Specific Question:   Supervising Provider    Answer:   Tresa Garter [id number]    No orders of the defined types were placed in this encounter.   Referral Orders  No referral(s) requested today    [name], MSN, ANE, FNP-BC [facility] [address] [place], [place] [address] [phone]- fax   Problem List Items Addressed This Visit      Other   Medication management - Primary    Other Visit Diagnoses    Hb-SS disease without crisis ([place])       Relevant Medications   oxyCODONE ER (XTAMPZA ER) 9 MG C12A (Start on 08/07/2020)   oxyCODONE (ROXICODONE) 15 MG immediate release tablet (Start on 08/07/2020)   Chronic prescription opiate use       Relevant Medications   oxyCODONE ER (XTAMPZA ER) 9 MG C12A (Start on 08/07/2020)   oxyCODONE (ROXICODONE) 15 MG immediate release tablet (Start on 08/07/2020)   Chronic pain syndrome       Relevant Medications   oxyCODONE ER (XTAMPZA ER) 9 MG C12A (Start on 08/07/2020)   oxyCODONE (ROXICODONE) 15 MG immediate release tablet (Start on 08/07/2020)   Anxiety       Initiation of Depo Provera       Counseling for initiation of birth control method       Follow up          Meds ordered this encounter  Medications  . oxyCODONE ER (XTAMPZA ER) 9 MG C12A    Sig: Take 9 mg by mouth every 12 (twelve) hours.    Dispense:  60 capsule    Refill:  0    Order Specific Question:   Supervising Provider     Answer:   Tresa Garter [id number]  . oxyCODONE (ROXICODONE) 15 MG immediate release tablet    Sig: Take 1 tablet (15 mg total) by mouth every 4 (four) hours as needed for up to 15 days for pain.    Dispense:  90 tablet    Refill:  0    Please do not refill this medication prior to 08/08/2019. Thank you.    Order Specific Question:   Supervising Provider    Answer:  [name] [[id number]]    Follow-up: Return in about 2 months (around 10/04/2020).    Kallie Locks, FNP

## 2020-08-07 ENCOUNTER — Telehealth: Payer: Self-pay | Admitting: Family Medicine

## 2020-08-07 NOTE — Telephone Encounter (Signed)
Done

## 2020-08-20 ENCOUNTER — Other Ambulatory Visit: Payer: Self-pay | Admitting: Family Medicine

## 2020-08-20 ENCOUNTER — Telehealth: Payer: Self-pay | Admitting: Family Medicine

## 2020-08-20 DIAGNOSIS — D571 Sickle-cell disease without crisis: Secondary | ICD-10-CM

## 2020-08-20 DIAGNOSIS — Z79891 Long term (current) use of opiate analgesic: Secondary | ICD-10-CM

## 2020-08-20 DIAGNOSIS — G894 Chronic pain syndrome: Secondary | ICD-10-CM

## 2020-08-20 MED ORDER — OXYCODONE HCL 15 MG PO TABS: 15.0000 mg | ORAL_TABLET | ORAL | 0 refills | Status: DC | PRN

## 2020-08-20 NOTE — Telephone Encounter (Signed)
Done

## 2020-08-22 ENCOUNTER — Encounter (HOSPITAL_COMMUNITY): Payer: Medicaid Other

## 2020-08-24 LAB — OPIATES CONFIRMATION, URINE
Codeine: NEGATIVE
Hydrocodone Confirm: 310 ng/mL
Hydrocodone: POSITIVE — AB
Hydromorphone: NEGATIVE
Morphine: NEGATIVE
Opiates: POSITIVE ng/mL — AB

## 2020-08-24 LAB — DRUG SCREEN 764883 11+OXYCO+ALC+CRT-BUND
Amphetamines, Urine: NEGATIVE ng/mL
BENZODIAZ UR QL: NEGATIVE ng/mL
Barbiturate: NEGATIVE ng/mL
Cocaine (Metabolite): NEGATIVE ng/mL
Creatinine: 76.6 mg/dL (ref 20.0–300.0)
Ethanol: NEGATIVE %
Meperidine: NEGATIVE ng/mL
Methadone Screen, Urine: NEGATIVE ng/mL
Oxycodone/Oxymorphone, Urine: NEGATIVE ng/mL
Phencyclidine: NEGATIVE ng/mL
Propoxyphene: NEGATIVE ng/mL
Tramadol: NEGATIVE ng/mL
pH, Urine: 5.6 (ref 4.5–8.9)

## 2020-08-24 LAB — CANNABINOID CONFIRMATION, UR
CANNABINOIDS: POSITIVE — AB
Carboxy THC GC/MS Conf: 340 ng/mL

## 2020-09-02 ENCOUNTER — Telehealth: Payer: Self-pay | Admitting: Family Medicine

## 2020-09-02 ENCOUNTER — Other Ambulatory Visit: Payer: Self-pay | Admitting: Family Medicine

## 2020-09-02 DIAGNOSIS — Z79891 Long term (current) use of opiate analgesic: Secondary | ICD-10-CM

## 2020-09-02 DIAGNOSIS — G894 Chronic pain syndrome: Secondary | ICD-10-CM

## 2020-09-02 DIAGNOSIS — D571 Sickle-cell disease without crisis: Secondary | ICD-10-CM

## 2020-09-02 MED ORDER — OXYCODONE HCL 15 MG PO TABS: 15.0000 mg | ORAL_TABLET | ORAL | 0 refills | Status: DC | PRN

## 2020-09-02 MED ORDER — XTAMPZA ER 9 MG PO C12A: 9.0000 mg | EXTENDED_RELEASE_CAPSULE | Freq: Two times a day (BID) | ORAL | 0 refills | Status: DC

## 2020-09-02 NOTE — Telephone Encounter (Signed)
Done

## 2020-09-17 ENCOUNTER — Telehealth: Payer: Self-pay | Admitting: Family Medicine

## 2020-09-17 NOTE — Telephone Encounter (Signed)
Tried to call patient to set up an appt., pt has voicemail that hasn't been set up yet.

## 2020-09-17 NOTE — Telephone Encounter (Signed)
PROVIDER:  Clovis Riley  MEDICATION:  Oxycodone  PHARMACY:  On file PT PHONE:  [phone]

## 2020-09-17 NOTE — Telephone Encounter (Signed)
Pt calling to speak with provider about symptoms with sickle cell. Please return call [phone].

## 2020-09-18 ENCOUNTER — Other Ambulatory Visit: Payer: Self-pay | Admitting: Family Medicine

## 2020-09-18 ENCOUNTER — Other Ambulatory Visit: Payer: Self-pay

## 2020-09-18 DIAGNOSIS — D571 Sickle-cell disease without crisis: Secondary | ICD-10-CM

## 2020-09-18 DIAGNOSIS — Z79891 Long term (current) use of opiate analgesic: Secondary | ICD-10-CM

## 2020-09-18 DIAGNOSIS — G894 Chronic pain syndrome: Secondary | ICD-10-CM

## 2020-09-18 MED ORDER — OXYCODONE HCL 15 MG PO TABS
15.0000 mg | ORAL_TABLET | ORAL | 0 refills | Status: AC | PRN
Start: 2020-09-18 — End: 2020-10-03

## 2020-09-18 NOTE — Telephone Encounter (Signed)
OXYCODONE ROXICODONE 15 MG needs approval from insurance.  Pt has the xtampz oxycodone ER 9 mg

## 2020-09-18 NOTE — Telephone Encounter (Signed)
oxyCODONE (ROXICODONE) 15 MG  Written 09/04/20 Expires 09/19/20.  Pt needs new prescription sent to pharmacy. Pt says she will pay out of pocket, she usually does pay out of pocket.    Pt states not concerned about prior auth.

## 2020-09-19 NOTE — Progress Notes (Signed)
[employer] is reviewing your PA request. Typically an electronic response will be received within 72 hours. To check for an update later, open this request from your dashboard.    You may close this dialog and return to your dashboard to perform other tasks.

## 2020-09-19 NOTE — Progress Notes (Signed)
Sandra Mckay with [facility] states generic roxi does not need prior auth and patients insurance has been authorized for refills.

## 2020-09-19 NOTE — Telephone Encounter (Signed)
POA was  sent though Cover My Meds  waiting on response for [facility] on approval.

## 2020-09-19 NOTE — Progress Notes (Signed)
Key: [id number] - PA Case ID: [id number] Need help? Call us at [phone] Status Sent to [place] 15MG  tablets Form [employer] Electronic Prior Authorization Form (2017 NCPDP)

## 2020-09-27 ENCOUNTER — Other Ambulatory Visit: Payer: Self-pay

## 2020-09-27 ENCOUNTER — Ambulatory Visit (INDEPENDENT_AMBULATORY_CARE_PROVIDER_SITE_OTHER): Payer: Medicaid Other | Admitting: Family Medicine

## 2020-09-27 ENCOUNTER — Encounter: Payer: Self-pay | Admitting: Family Medicine

## 2020-09-27 VITALS — BP 114/70 | HR 95 | Temp 100.0°F | Wt 117.4 lb

## 2020-09-27 DIAGNOSIS — F419 Anxiety disorder, unspecified: Secondary | ICD-10-CM | POA: Diagnosis not present

## 2020-09-27 DIAGNOSIS — Z79891 Long term (current) use of opiate analgesic: Secondary | ICD-10-CM

## 2020-09-27 DIAGNOSIS — D571 Sickle-cell disease without crisis: Secondary | ICD-10-CM

## 2020-09-27 DIAGNOSIS — Z09 Encounter for follow-up examination after completed treatment for conditions other than malignant neoplasm: Secondary | ICD-10-CM

## 2020-09-27 DIAGNOSIS — G894 Chronic pain syndrome: Secondary | ICD-10-CM | POA: Diagnosis not present

## 2020-09-27 MED ORDER — OXYCODONE HCL 15 MG PO TABS: 15.0000 mg | ORAL_TABLET | ORAL | 0 refills | Status: DC | PRN

## 2020-09-27 NOTE — Progress Notes (Signed)
Patient [facility]   Established Patient Office Visit  Subjective:  Patient ID: Sandra Mckay, female    DOB: 06-14-1996  Age: 25 y.o. MRN: [id number]  CC:  Chief Complaint  Patient presents with  . Follow-up    Having chest pain , sob , feeing like she can take a deep breath  started last week , as gotten a little better , just taking pain meds for pain , having mild coughing , feels like dull pain , need refill on muclse relaxer and Zofran     HPI Sandra Mckay is a 25 year old female who presents for Follow up today.    Patient Active Problem List   Diagnosis Date Noted  . Medication management 12/12/2018  . Bipolar and related disorder ([place]) 04/04/2017  . Sickle cell pain crisis ([place]) 12/05/2016  . Sickle cell anemia with crisis ([place]) 12/05/2016  . Vasovagal syncope 09/18/2016  . Closed fracture of lumbar vertebra without spinal cord injury ([place]) 07/15/2016  . Nausea without vomiting 09/09/2015  . Vitamin D deficiency 07/22/2015  . Sickle cell anemia ([place]) 06/08/2015   Current Status: Since her last office visit, she is doing well with no complaints. She states that she has chronic pain in her hips and  legs. She rates her pain today at 7-8/10. She has not had a hospital visit for Sickle Cell Crisis since 09/14/2020 where she was treated and discharged the same day. Her last admission to [facility] was 01/20/2021. She is currently taking all medications as prescribed and staying well hydrated. She reports occasional nausea, constipation, dizziness and headaches. She last took pain medications last night. She is contemplating coming to [facility] as needed. She denies fevers, chills, fatigue, recent infections, weight loss, and night sweats. She has not had any visual changes, and falls. No chest pain, heart palpitations, cough and shortness of breath reported. Denies GI problems such as vomiting, and   diarrhea. She has no reports of blood in stools, dysuria and hematuria. No depression or anxiety reported today. She is taking all medications as prescribed.  Past Medical History:  Diagnosis Date  . Blood transfusion without reported diagnosis   . Bronchitis   . Chickenpox   . Depression   . Elevated ferritin level 05/2019  . Heart murmur   . Pulmonary hypertension ([place])   . Sickle cell anemia ([facility])   . 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 Thrombocytosis 05/2019  . Urinary tract infection   . Vitamin D deficiency     Past Surgical History:  Procedure Laterality Date  . CHOLECYSTECTOMY  2011  . EYE SURGERY     Sty removal  . LABIAL ADHESION LYSIS  1999  . 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 TONSILLECTOMY  2012    Family History  Problem Relation Age of Onset  . Arthritis Other        grandparent  . Stroke Other   . Hypertension Other   . Diabetes Other        grandparent  . Cancer - Other Other        Glioblastoma    Social History   Socioeconomic History  . Marital status: Single    Spouse name: Not on file  . Number of children: Not on file  . Years of education: Not on file  . Highest education level: Not on file  Occupational History  . Not on file  Tobacco Use  . Smoking status: Former Research scientist (life sciences)  . Smokeless tobacco: Never Used  Vaping Use  . Vaping Use: Never used  Substance and Sexual Activity  . Alcohol use: Yes    Alcohol/week: 0.0 standard drinks    Comment: occ  . Drug use: No  . Sexual activity: Yes  Other Topics Concern  . Not on file  Social History Narrative   Lives with mom in a one story home.  Education: 2 years of college.    Social Determinants of Health   Financial Resource Strain: Not on file  Food Insecurity: Not on file  Transportation Needs: Not on file  Physical Activity: Not on file  Stress: Not on file  Social Connections: Not on file  Intimate Partner Violence: Not on file     Outpatient Medications Prior to Visit  Medication Sig Dispense Refill  . folic acid (FOLVITE) 1 MG tablet Take 1 tablet (1 mg total) by mouth daily. 90 tablet 3  . hydroxyurea (HYDREA) 500 MG capsule TAKE 3 CAPSULES (1,500 MG TOTAL) BY MOUTH DAILY. 90 capsule 6  . ibuprofen (ADVIL) 800 MG tablet Take 1 tablet (800 mg total) by mouth every 8 (eight) hours as needed. 60 tablet 6  . megestrol (MEGACE) 40 MG tablet Take 1 tablet (40 mg total) by mouth daily. 30 tablet 11  . oxyCODONE (ROXICODONE) 15 MG immediate release tablet Take 1 tablet (15 mg total) by mouth every 4 (four) hours as needed for up to 15 days for pain. 90 tablet 0  . oxyCODONE ER (XTAMPZA ER) 9 MG C12A Take 9 mg by mouth every 12 (twelve) hours. 60 capsule 0  . tiZANidine (ZANAFLEX) 4 MG capsule Take 1 capsule (4 mg total) by mouth 3 (three) times daily as needed for muscle spasms. 90 capsule 6  . Vitamin D, Ergocalciferol, (DRISDOL) 1.25 MG (50000 UNIT) CAPS capsule Take 1 capsule (50,000 Units total) by mouth every Saturday. Saturday. 5 capsule 5  . ondansetron (ZOFRAN) 4 MG tablet Take 1 tablet (4 mg total) by mouth every 8 (eight) hours as needed for nausea or vomiting. (Patient not taking: Reported on 09/27/2020) 20 tablet 5  . sertraline (ZOLOFT) 25 MG tablet Take 25 mg by mouth daily. (Patient not taking: Reported on 09/27/2020)    . doxylamine, Sleep, (UNISOM) 25 MG tablet Take 25 mg by mouth at bedtime as needed for sleep.     No facility-administered medications prior to visit.    Allergies  Allergen Reactions  . Latex Rash    ROS Review of Systems  Constitutional: Negative.   HENT: Negative.   Eyes: Negative.   Respiratory: Negative.   Cardiovascular: Negative.   Gastrointestinal: Positive for constipation (occasional) and nausea (occasional).  Endocrine: Negative.   Genitourinary: Negative.   Musculoskeletal: Positive for arthralgias (generalized joint pain).  Skin: Negative.   Allergic/Immunologic:  Negative.   Neurological: Positive for dizziness (occasional ) and headaches (occasional ).  Hematological: Negative.   Psychiatric/Behavioral: Negative.       Objective:    Physical Exam Vitals and nursing note reviewed.  Constitutional:      Appearance: Normal appearance.  HENT:     Head: Normocephalic and atraumatic.     Nose: Nose normal.     Mouth/Throat:     Mouth: Mucous membranes are moist.     Pharynx: Oropharynx is clear.  Cardiovascular:     Rate and Rhythm: Normal rate and regular rhythm.  Pulses: Normal pulses.     Heart sounds: Normal heart sounds.  Pulmonary:     Effort: Pulmonary effort is normal.     Breath sounds: Normal breath sounds.  Abdominal:     General: Bowel sounds are normal.     Palpations: Abdomen is soft.  Musculoskeletal:        General: Normal range of motion.     Cervical back: Normal range of motion and neck supple.  Skin:    General: Skin is warm and dry.  Neurological:     General: No focal deficit present.     Mental Status: She is alert and oriented to person, place, and time.  Psychiatric:        Mood and Affect: Mood normal.        Behavior: Behavior normal.        Thought Content: Thought content normal.        Judgment: Judgment normal.     BP 114/70   Pulse 95   Temp 100 F (37.8 C) (Temporal)   Wt 117 lb 6.4 oz (53.3 kg)   SpO2 91%   BMI 20.80 kg/m  Wt Readings from Last 3 Encounters:  09/27/20 117 lb 6.4 oz (53.3 kg)  08/06/20 117 lb (53.1 kg)  03/08/20 114 lb (51.7 kg)     Health Maintenance Due  Topic Date Due  . Hepatitis C Screening  Never done  . COVID-19 Vaccine (1) Never done  . PAP-Cervical Cytology Screening  Never done  . PAP SMEAR-Modifier  09/01/2017    There are no preventive care reminders to display for this patient.  No results found for: TSH Lab Results  Component Value Date   WBC 11.5 (H) 01/23/2020   HGB 7.6 (L) 01/23/2020   HCT 21.7 (L) 01/23/2020   MCV 112.4 (H) 01/23/2020    PLT 446 (H) 01/23/2020   Lab Results  Component Value Date   NA 138 01/23/2020   K 4.0 01/23/2020   CO2 19 (L) 01/23/2020   GLUCOSE 84 01/23/2020   BUN 11 01/23/2020   CREATININE 0.37 (L) 01/23/2020   BILITOT 4.2 (H) 01/23/2020   ALKPHOS 64 01/23/2020   AST 34 01/23/2020   ALT 21 01/23/2020   PROT 7.6 01/23/2020   ALBUMIN 5.1 (H) 01/23/2020   CALCIUM 9.6 01/23/2020   ANIONGAP 11 01/23/2020   No results found for: CHOL No results found for: HDL No results found for: LDLCALC No results found for: TRIG No results found for: CHOLHDL No results found for: HGBA1C  Assessment & Plan:   1. Hb-SS disease without crisis [facility]) She is doing well today r/t her chronic pain management. She will continue to take pain medications as prescribed; will continue to avoid extreme heat and cold; will continue to eat a healthy diet and drink at least 64 ounces of water daily; continue stool softener as needed; will avoid colds and flu; will continue to get plenty of sleep and rest; will continue to avoid high stressful situations and remain infection free; will continue Folic Acid 1 mg daily to avoid sickle cell crisis. Continue to follow up with Hematologist as needed.  - CBC with Differential - oxyCODONE (ROXICODONE) 15 MG immediate release tablet; Take 1 tablet (15 mg total) by mouth every 4 (four) hours as needed for pain.  Dispense: 30 tablet; Refill: 0  2. Chronic prescription opiate use - oxyCODONE (ROXICODONE) 15 MG immediate release tablet; Take 1 tablet (15 mg total) by mouth every 4 (  four) hours as needed for pain.  Dispense: 30 tablet; Refill: 0  3. Chronic pain syndrome - oxyCODONE (ROXICODONE) 15 MG immediate release tablet; Take 1 tablet (15 mg total) by mouth every 4 (four) hours as needed for pain.  Dispense: 30 tablet; Refill: 0  4. Anxiety Stable.   5. Follow up She will follow up in 2 months.   Meds ordered this encounter  Medications  . oxyCODONE (ROXICODONE) 15 MG  immediate release tablet    Sig: Take 1 tablet (15 mg total) by mouth every 4 (four) hours as needed for pain.    Dispense:  30 tablet    Refill:  0    Order Specific Question:   Supervising Provider    Answer:   Tresa Garter [id number]   Orders Placed This Encounter  Procedures  . CBC with Differential    Referral Orders  No referral(s) requested today    [name], MSN, ANE, FNP-BC [place], [place] [address] [phone] fax   Problem List Items Addressed This Visit   None   Visit Diagnoses    Hb-SS disease without crisis [facility])    -  Primary   Relevant Medications   oxyCODONE (ROXICODONE) 15 MG immediate release tablet (Start on 10/02/2020)   Other Relevant Orders   CBC with Differential   Chronic prescription opiate use       Relevant Medications   oxyCODONE (ROXICODONE) 15 MG immediate release tablet (Start on 10/02/2020)   Chronic pain syndrome       Relevant Medications   oxyCODONE (ROXICODONE) 15 MG immediate release tablet (Start on 10/02/2020)   Anxiety       Follow up          Meds ordered this encounter  Medications  . oxyCODONE (ROXICODONE) 15 MG immediate release tablet    Sig: Take 1 tablet (15 mg total) by mouth every 4 (four) hours as needed for pain.    Dispense:  30 tablet    Refill:  0    Order Specific Question:   Supervising Provider    Answer:   Tresa Garter [id number]    Follow-up: No follow-ups on file.    Azzie Glatter, FNP

## 2020-09-28 LAB — CBC WITH DIFFERENTIAL/PLATELET
Basophils Absolute: 0.1 10*3/uL (ref 0.0–0.2)
Basos: 1 %
EOS (ABSOLUTE): 0 10*3/uL (ref 0.0–0.4)
Eos: 0 %
Hematocrit: 20.9 % — ABNORMAL LOW (ref 34.0–46.6)
Hemoglobin: 7 g/dL — CL (ref 11.1–15.9)
Immature Grans (Abs): 0.1 10*3/uL (ref 0.0–0.1)
Immature Granulocytes: 1 %
Lymphocytes Absolute: 2.8 10*3/uL (ref 0.7–3.1)
Lymphs: 37 %
MCH: 37.4 pg — ABNORMAL HIGH (ref 26.6–33.0)
MCHC: 33.5 g/dL (ref 31.5–35.7)
MCV: 112 fL — ABNORMAL HIGH (ref 79–97)
Monocytes Absolute: 1.2 10*3/uL — ABNORMAL HIGH (ref 0.1–0.9)
Monocytes: 15 %
NRBC: 1 % — ABNORMAL HIGH (ref 0–0)
Neutrophils Absolute: 3.5 10*3/uL (ref 1.4–7.0)
Neutrophils: 46 %
Platelets: 410 10*3/uL (ref 150–450)
RBC: 1.87 x10E6/uL — CL (ref 3.77–5.28)
RDW: 17.9 % — ABNORMAL HIGH (ref 11.7–15.4)
WBC: 7.7 10*3/uL (ref 3.4–10.8)

## 2020-10-02 ENCOUNTER — Other Ambulatory Visit: Payer: Self-pay | Admitting: Family Medicine

## 2020-10-02 ENCOUNTER — Telehealth: Payer: Self-pay | Admitting: Family Medicine

## 2020-10-02 DIAGNOSIS — D571 Sickle-cell disease without crisis: Secondary | ICD-10-CM

## 2020-10-02 DIAGNOSIS — G894 Chronic pain syndrome: Secondary | ICD-10-CM

## 2020-10-02 DIAGNOSIS — Z79891 Long term (current) use of opiate analgesic: Secondary | ICD-10-CM

## 2020-10-02 MED ORDER — OXYCODONE HCL 15 MG PO TABS: 15.0000 mg | ORAL_TABLET | ORAL | 0 refills | Status: DC | PRN

## 2020-10-02 NOTE — Telephone Encounter (Signed)
Pharmacy only gave pt 5 day supply.  Please get remaining supply to her.  OXYCODONE 15 MG. Pt picked up 5 day supply today. [facility]

## 2020-10-03 ENCOUNTER — Other Ambulatory Visit: Payer: Self-pay | Admitting: Family Medicine

## 2020-10-03 DIAGNOSIS — D571 Sickle-cell disease without crisis: Secondary | ICD-10-CM

## 2020-10-03 DIAGNOSIS — Z79891 Long term (current) use of opiate analgesic: Secondary | ICD-10-CM

## 2020-10-03 DIAGNOSIS — G894 Chronic pain syndrome: Secondary | ICD-10-CM

## 2020-10-03 MED ORDER — OXYCODONE HCL 15 MG PO TABS: 15.0000 mg | ORAL_TABLET | ORAL | 0 refills | Status: DC | PRN

## 2020-10-03 NOTE — Telephone Encounter (Signed)
Per pt phone call: Original script should have been for # 90 for pt to get # 60 pills but it was written for total # 60 and pt got # 30.  Please correct per pt request.

## 2020-10-14 ENCOUNTER — Telehealth: Payer: Self-pay

## 2020-10-14 ENCOUNTER — Other Ambulatory Visit: Payer: Self-pay | Admitting: Family Medicine

## 2020-10-14 DIAGNOSIS — Z79891 Long term (current) use of opiate analgesic: Secondary | ICD-10-CM

## 2020-10-14 DIAGNOSIS — D571 Sickle-cell disease without crisis: Secondary | ICD-10-CM

## 2020-10-14 DIAGNOSIS — G894 Chronic pain syndrome: Secondary | ICD-10-CM

## 2020-10-14 MED ORDER — OXYCODONE HCL 15 MG PO TABS: 15.0000 mg | ORAL_TABLET | ORAL | 0 refills | Status: DC | PRN

## 2020-10-14 NOTE — Telephone Encounter (Signed)
Med refill Oxycodone 15 mg 

## 2020-10-15 ENCOUNTER — Telehealth (HOSPITAL_COMMUNITY): Payer: Self-pay | Admitting: General Practice

## 2020-10-15 ENCOUNTER — Non-Acute Institutional Stay (HOSPITAL_COMMUNITY)
Admission: AD | Admit: 2020-10-15 | Discharge: 2020-10-15 | Disposition: A | Discharge status: Home / Self Care | Payer: Medicaid Other | Source: Ambulatory Visit | Attending: Internal Medicine | Admitting: Internal Medicine

## 2020-10-15 ENCOUNTER — Telehealth (HOSPITAL_COMMUNITY): Payer: Self-pay | Admitting: *Deleted

## 2020-10-15 DIAGNOSIS — F1490 Cocaine use, unspecified, uncomplicated: Secondary | ICD-10-CM | POA: Diagnosis not present

## 2020-10-15 DIAGNOSIS — Z87891 Personal history of nicotine dependence: Secondary | ICD-10-CM | POA: Diagnosis not present

## 2020-10-15 DIAGNOSIS — F1120 Opioid dependence, uncomplicated: Secondary | ICD-10-CM | POA: Diagnosis not present

## 2020-10-15 DIAGNOSIS — G894 Chronic pain syndrome: Secondary | ICD-10-CM | POA: Insufficient documentation

## 2020-10-15 DIAGNOSIS — D5700 Hb-SS disease with crisis, unspecified: Secondary | ICD-10-CM | POA: Diagnosis present

## 2020-10-15 LAB — CBC WITH DIFFERENTIAL/PLATELET
Abs Immature Granulocytes: 0.03 10*3/uL (ref 0.00–0.07)
Basophils Absolute: 0.1 10*3/uL (ref 0.0–0.1)
Basophils Relative: 1 %
Eosinophils Absolute: 0.1 10*3/uL (ref 0.0–0.5)
Eosinophils Relative: 1 %
HCT: 21.7 % — ABNORMAL LOW (ref 36.0–46.0)
Hemoglobin: 7.6 g/dL — ABNORMAL LOW (ref 12.0–15.0)
Immature Granulocytes: 0 %
Lymphocytes Relative: 32 %
Lymphs Abs: 2.4 10*3/uL (ref 0.7–4.0)
MCH: 37.8 pg — ABNORMAL HIGH (ref 26.0–34.0)
MCHC: 35 g/dL (ref 30.0–36.0)
MCV: 108 fL — ABNORMAL HIGH (ref 80.0–100.0)
Monocytes Absolute: 1.3 10*3/uL — ABNORMAL HIGH (ref 0.1–1.0)
Monocytes Relative: 18 %
Neutro Abs: 3.5 10*3/uL (ref 1.7–7.7)
Neutrophils Relative %: 48 %
Platelets: 434 10*3/uL — ABNORMAL HIGH (ref 150–400)
RBC: 2.01 MIL/uL — ABNORMAL LOW (ref 3.87–5.11)
RDW: 17.7 % — ABNORMAL HIGH (ref 11.5–15.5)
WBC: 7.4 10*3/uL (ref 4.0–10.5)
nRBC: 1.5 % — ABNORMAL HIGH (ref 0.0–0.2)

## 2020-10-15 LAB — COMPREHENSIVE METABOLIC PANEL
ALT: 16 U/L (ref 0–44)
AST: 23 U/L (ref 15–41)
Albumin: 4.6 g/dL (ref 3.5–5.0)
Alkaline Phosphatase: 66 U/L (ref 38–126)
Anion gap: 9 (ref 5–15)
BUN: 7 mg/dL (ref 6–20)
CO2: 23 mmol/L (ref 22–32)
Calcium: 9.2 mg/dL (ref 8.9–10.3)
Chloride: 108 mmol/L (ref 98–111)
Creatinine, Ser: 0.47 mg/dL (ref 0.44–1.00)
GFR, Estimated: >60 mL/min (ref >60)
Glucose, Bld: 88 mg/dL (ref 70–99)
Potassium: 3.8 mmol/L (ref 3.5–5.1)
Sodium: 140 mmol/L (ref 135–145)
Total Bilirubin: 2.3 mg/dL — ABNORMAL HIGH (ref 0.3–1.2)
Total Protein: 6.9 g/dL (ref 6.5–8.1)

## 2020-10-15 LAB — LACTATE DEHYDROGENASE: LDH: 232 U/L — ABNORMAL HIGH (ref 98–192)

## 2020-10-15 LAB — TYPE AND SCREEN
ABO/RH(D): A POS
Antibody Screen: NEGATIVE

## 2020-10-15 LAB — RETICULOCYTES
Immature Retic Fract: 28.6 % — ABNORMAL HIGH (ref 2.3–15.9)
RBC.: 1.99 MIL/uL — ABNORMAL LOW (ref 3.87–5.11)
Retic Count, Absolute: 354.5 10*3/uL — ABNORMAL HIGH (ref 19.0–186.0)
Retic Ct Pct: 16.9 % — ABNORMAL HIGH (ref 0.4–3.1)

## 2020-10-15 LAB — RAPID URINE DRUG SCREEN, HOSP PERFORMED
Amphetamines: NOT DETECTED
Barbiturates: NOT DETECTED
Benzodiazepines: NOT DETECTED
Cocaine: POSITIVE — AB
Opiates: NOT DETECTED
Tetrahydrocannabinol: POSITIVE — AB

## 2020-10-15 LAB — PREGNANCY, URINE: Preg Test, Ur: NEGATIVE

## 2020-10-15 MED ORDER — SODIUM CHLORIDE 0.9% FLUSH: 9.0000 mL | INTRAVENOUS | Status: DC | PRN

## 2020-10-15 MED ORDER — SODIUM CHLORIDE 0.45 % IV SOLN
INTRAVENOUS | Status: DC
Start: 2020-10-15 — End: 2020-10-15

## 2020-10-15 MED ORDER — ONDANSETRON HCL 4 MG/2ML IJ SOLN
4.0000 mg | Freq: Four times a day (QID) | INTRAMUSCULAR | Status: DC | PRN
  Administered 2020-10-15: 4 mg via INTRAVENOUS
  Filled 2020-10-15: qty 2

## 2020-10-15 MED ORDER — HYDROMORPHONE 1 MG/ML IV SOLN
INTRAVENOUS | Status: DC
  Administered 2020-10-15: 30 mg via INTRAVENOUS
  Administered 2020-10-15: 6 mg via INTRAVENOUS
  Filled 2020-10-15: qty 30

## 2020-10-15 MED ORDER — NALOXONE HCL 0.4 MG/ML IJ SOLN: 0.4000 mg | INTRAMUSCULAR | Status: DC | PRN

## 2020-10-15 MED ORDER — KETOROLAC TROMETHAMINE 30 MG/ML IJ SOLN
15.0000 mg | Freq: Once | INTRAMUSCULAR | Status: AC
  Administered 2020-10-15: 15 mg via INTRAVENOUS
  Filled 2020-10-15: qty 1

## 2020-10-15 MED ORDER — DIPHENHYDRAMINE HCL 25 MG PO CAPS
25.0000 mg | ORAL_CAPSULE | ORAL | Status: DC | PRN
  Administered 2020-10-15: 25 mg via ORAL
  Filled 2020-10-15: qty 1

## 2020-10-15 MED ORDER — ACETAMINOPHEN 500 MG PO TABS
1000.0000 mg | ORAL_TABLET | Freq: Once | ORAL | Status: AC
  Administered 2020-10-15: 1000 mg via ORAL
  Filled 2020-10-15: qty 2

## 2020-10-15 NOTE — Progress Notes (Signed)
Pt admitted to the day hospital for treatment of sickle cell pain crisis. Pt reported pain to bilateral legs and back rated a 8/10. Pt given PO Benadryl and Tylenol, IV toradol and zofran ,placed on Dilaudid PCA, and hydrated with IV fluids. At discharge patient reported pain a 6/10, unable to stay for additional  fluids and pain medication due to childcare needs. Discharge instructions given to pt. Pt alert , oriented and ambulatory at discharge.

## 2020-10-15 NOTE — H&P (Signed)
[facility] History and Physical   Date: 10/15/2020  Patient name: Sandra Mckay Medical record number: [id number] Date of birth: 1995-10-30 Age: 25 y.o. Gender: female PCP: Azzie Glatter, FNP  Attending physician: Tresa Garter, MD  Chief Complaint: Sickle cell pain  History of Present Illness: Sandra Mckay is a 25 year old female with a medical history significant for sickle cell disease, chronic pain syndrome, opiate dependence and tolerance, history of bipolar disorder, and history of anemia of chronic disease presents with complaints of generalized pain that is consistent with her typical pain crisis.  Patient states that pain intensity has been elevated over the past several days and unrelieved by home medications.  She attributes pain crisis to increased stressors at home and changes in weather.  She last had oxycodone this a.m. without sustained relief.  She rates pain as 10/10 characterized as constant and sharp.  She denies any dizziness, headache, persistent cough, shortness of breath, or chest pain.  No urinary symptoms, nausea, vomiting, or diarrhea.  No sick contacts, recent travel, or exposure to COVID-19.  Meds: Medications Prior to Admission  Medication Sig Dispense Refill Last Dose  . folic acid (FOLVITE) 1 MG tablet Take 1 tablet (1 mg total) by mouth daily. 90 tablet 3   . hydroxyurea (HYDREA) 500 MG capsule TAKE 3 CAPSULES (1,500 MG TOTAL) BY MOUTH DAILY. 90 capsule 6   . ibuprofen (ADVIL) 800 MG tablet Take 1 tablet (800 mg total) by mouth every 8 (eight) hours as needed. 60 tablet 6   . megestrol (MEGACE) 40 MG tablet Take 1 tablet (40 mg total) by mouth daily. 30 tablet 11   . ondansetron (ZOFRAN) 4 MG tablet Take 1 tablet (4 mg total) by mouth every 8 (eight) hours as needed for nausea or vomiting. (Patient not taking: Reported on 09/27/2020) 20 tablet 5   . [START ON 10/19/2020] oxyCODONE (ROXICODONE) 15 MG immediate release tablet Take  1 tablet (15 mg total) by mouth every 4 (four) hours as needed for up to 15 days for pain. 90 tablet 0   . sertraline (ZOLOFT) 25 MG tablet Take 25 mg by mouth daily. (Patient not taking: Reported on 09/27/2020)     . tiZANidine (ZANAFLEX) 4 MG capsule Take 1 capsule (4 mg total) by mouth 3 (three) times daily as needed for muscle spasms. 90 capsule 6   . Vitamin D, Ergocalciferol, (DRISDOL) 1.25 MG (50000 UNIT) CAPS capsule Take 1 capsule (50,000 Units total) by mouth every Saturday. Saturday. 5 capsule 5     Allergies: Latex Past Medical History:  Diagnosis Date  . Blood transfusion without reported diagnosis   . Bronchitis   . Chickenpox   . Depression   . Elevated ferritin level 05/2019  . Heart murmur   . Pulmonary hypertension ([place])   . Sickle cell anemia (H[facility])   . 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 Thrombocytosis 05/2019  . Urinary tract infection   . Vitamin D deficiency    Past Surgical History:  Procedure Laterality Date  . CHOLECYSTECTOMY  2011  . EYE SURGERY     Sty removal  . LABIAL ADHESION LYSIS  1999  . 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 TONSILLECTOMY  2012   Family History  Problem Relation Age of Onset  . Arthritis Other        grandparent  . Stroke Other   . Hypertension Other   .  Diabetes Other        grandparent  . Cancer - Other Other        Glioblastoma   Social History   Socioeconomic History  . Marital status: Single    Spouse name: Not on file  . Number of children: Not on file  . Years of education: Not on file  . Highest education level: Not on file  Occupational History  . Not on file  Tobacco Use  . Smoking status: Former Research scientist (life sciences)  . Smokeless tobacco: Never Used  Vaping Use  . Vaping Use: Never used  Substance and Sexual Activity  . Alcohol use: Yes    Alcohol/week: 0.0 standard drinks    Comment: occ  . Drug use: No  . Sexual activity: Yes  Other Topics Concern  . Not on file   Social History Narrative   Lives with mom in a one story home.  Education: 2 years of college.    Social Determinants of Health   Financial Resource Strain: Not on file  Food Insecurity: Not on file  Transportation Needs: Not on file  Physical Activity: Not on file  Stress: Not on file  Social Connections: Not on file  Intimate Partner Violence: Not on file    Review of Systems: Review of Systems  Constitutional: Negative for chills and fever.  HENT: Negative.   Eyes: Negative.   Respiratory: Negative.   Cardiovascular: Negative.   Gastrointestinal: Negative.   Genitourinary: Negative.   Musculoskeletal: Positive for back pain and joint pain.  Skin: Negative.   Endo/Heme/Allergies: Negative.   Psychiatric/Behavioral: Positive for depression.     Physical Exam: Blood pressure 113/60, pulse 74, temperature 98.4 F (36.9 C), temperature source Temporal, resp. rate 16, SpO2 100 %, unknown if currently breastfeeding. Physical Exam Constitutional:      Appearance: Normal appearance.  Eyes:     Pupils: Pupils are equal, round, and reactive to light.  Cardiovascular:     Rate and Rhythm: Normal rate and regular rhythm.     Pulses: Normal pulses.  Pulmonary:     Effort: Pulmonary effort is normal.  Abdominal:     General: Abdomen is flat. Bowel sounds are normal.  Musculoskeletal:        General: Normal range of motion.  Skin:    General: Skin is warm.  Neurological:     Mental Status: She is alert.  Psychiatric:        Mood and Affect: Mood is depressed. Affect is tearful.        Thought Content: Thought content normal.        Judgment: Judgment normal.      Lab results: No results found for this or any previous visit (from the past 24 hour(s)).  Imaging results:  No results found.   Assessment & Plan: Patient admitted to sickle cell day infusion center for management of pain crisis.  Patient is opiate tolerant Initiate IV dilaudid PCA. Settings of 0.5  mg, 10-minute lockout, and 3 mg/h IV fluids, 0.45% normal saline at 150 mL/h Toradol 15 mg IV times one dose Tylenol 1000 mg by mouth times one dose Review CBC with differential, complete metabolic panel, and reticulocytes as results become available. Baseline hemoglobin is 7.8 g/dL Pain intensity will be reevaluated in context of functioning and relationship to baseline as care progresses If pain intensity remains elevated and/or sudden change in hemodynamic stability transition to inpatient services for higher level of care.   [name]  APRN,  MSN, FNP-C Patient [place] [address] [place], [place] [address] [phone]    10/15/2020, 10:10 AM

## 2020-10-15 NOTE — BH Specialist Note (Signed)
Integrated Behavioral Health Case Management Referral Note  10/15/2020 Name: Patrecia Veiga MRN: [id number] DOB: 27-Mar-1996 Margeret Stachnik is a 25 y.o. year old female who sees Azzie Glatter, FNP for primary care. LCSW was consulted to assess patient's needs and assist the patient with Mental Health Counseling and Resources.  Interpreter: No.   Interpreter Name & Language: none  Assessment: Patient experiencing Mental Health Concerns . [name] was consulted by RN in the day hospital for mental health concerns for patient. RN indicated patient denied thoughts of harming self.  Intervention: [name] met with patient at bedside in the day hospital for brief assessment. Patient reported she is feeling more stressed out and is having a harder time managing her illness lately. She recently lost her job. She feels her motivation is low. She has had counseling in the past, several years ago in [place] and is interested counseling again.   She would also like to apply for social security disability benefits. [name] referred patient to the [facility] for disability application.   [name] and patient made appointment for [place] ([facility]) counseling for 10/25/20. [name] to follow outpatient.   Review of patient status, including review of consultants reports, relevant laboratory and other test results, and collaboration with appropriate care team members and the patient's provider was performed as part of comprehensive patient evaluation and provision of services.    SDOH (Social Determinants of Health) assessments performed: No    Goals Addressed   None      Follow up Plan: 1. Integrated Behavioral Health [facility]) outpatient appointment 10/25/20 2. Referral to [facility] for assistance with disability application  [name], [facility] [phone]

## 2020-10-15 NOTE — Discharge Summary (Signed)
[facility] Discharge Summary   Patient ID: Sandra Mckay MRN: [id number] DOB/AGE: 29-May-1996 25 y.o.  Admit date: 10/15/2020 Discharge date: 10/15/2020  Primary Care Physician:  Azzie Glatter, FNP  Admission Diagnoses:  Active Problems:   Sickle cell pain crisis [facility])   Discharge Medications:  Allergies as of 10/15/2020      Reactions   Latex Rash      Medication List    TAKE these medications   folic acid 1 MG tablet Commonly known as: FOLVITE Take 1 tablet (1 mg total) by mouth daily.   hydroxyurea 500 MG capsule Commonly known as: HYDREA TAKE 3 CAPSULES (1,500 MG TOTAL) BY MOUTH DAILY.   ibuprofen 800 MG tablet Commonly known as: ADVIL Take 1 tablet (800 mg total) by mouth every 8 (eight) hours as needed.   megestrol 40 MG tablet Commonly known as: MEGACE Take 1 tablet (40 mg total) by mouth daily.   ondansetron 4 MG tablet Commonly known as: Zofran Take 1 tablet (4 mg total) by mouth every 8 (eight) hours as needed for nausea or vomiting.   oxyCODONE 15 MG immediate release tablet Commonly known as: ROXICODONE Take 1 tablet (15 mg total) by mouth every 4 (four) hours as needed for up to 15 days for pain. Start taking on: October 19, 2020   sertraline 25 MG tablet Commonly known as: ZOLOFT Take 25 mg by mouth daily.   tiZANidine 4 MG capsule Commonly known as: Zanaflex Take 1 capsule (4 mg total) by mouth 3 (three) times daily as needed for muscle spasms.   Vitamin D (Ergocalciferol) 1.25 MG (50000 UNIT) Caps capsule Commonly known as: DRISDOL Take 1 capsule (50,000 Units total) by mouth every Saturday. Saturday.        Consults:  None  Significant Diagnostic Studies:  No results found.  History of present illness:  Sandra Mckay is a 25 year old female with a medical history significant for sickle cell disease, chronic pain syndrome, opiate dependence and tolerance, history of bipolar disorder, and history of anemia of  chronic disease presents with complaints of generalized pain that is consistent with her typical pain crisis.  Patient states that pain intensity has been elevated over the past several days and unrelieved by home medications.  She attributes pain crisis to increased stressors at home and changes in weather.  She last had oxycodone this a.m. without sustained relief.  She rates pain as 10/10 characterized as constant and sharp.  She denies any dizziness, headache, persistent cough, shortness of breath, or chest pain.  No urinary symptoms, nausea, vomiting, or diarrhea.  No sick contacts, recent travel, or exposure to COVID-19. [facility] Course: Patient admitted to sickle cell day infusion clinic for management of pain crisis. Reviewed all laboratory values.  Hemoglobin 7.6, consistent with patient's baseline.  There is no clinical indication for blood transfusion on today. Reviewed rapid urine drug screen, positive for cocaine and marijuana.  Discussed findings with patient at length.  Patient was referred to alcohol and drug services, given information.  Also, referred to behavioral specialist.  She does not warrant any inpatient treatment at this time for this problem.  Pain managed with IV Dilaudid via PCA. Toradol 15 mg x 1 Tylenol 1000 mg x 1 IV fluids, 0.45% saline at 150 mL/h Pain intensity decreased to 6/10.  No inpatient admission warranted.  She is alert, oriented, and ambulating without assistance.  Patient will discharge home in a hemodynamically stable condition.  Discharge instructions:  [facility] Drug Services [address] [place]   Resume all home medications.   Follow up with PCP as previously  scheduled.   Discussed the importance of drinking 64 ounces of water daily, dehydration of red blood cells may lead further sickling.   Avoid all stressors that precipitate sickle cell pain crisis.     The patient was given clear  instructions to go to ER or return to medical center if symptoms do not improve, worsen or new problems develop.       Physical Exam at Discharge:  BP 117/70 (BP Location: Right Arm)   Pulse 71   Temp 98.8 F (37.1 C) (Temporal)   Resp 18   LMP 10/07/2020   SpO2 100%  Physical Exam Eyes:     Pupils: Pupils are equal, round, and reactive to light.  Cardiovascular:     Rate and Rhythm: Normal rate and regular rhythm.     Pulses: Normal pulses.  Pulmonary:     Effort: Pulmonary effort is normal.  Abdominal:     General: Abdomen is flat. Bowel sounds are normal.  Neurological:     General: No focal deficit present.     Mental Status: She is alert. Mental status is at baseline.  Psychiatric:        Mood and Affect: Mood normal.        Behavior: Behavior normal.        Thought Content: Thought content normal.        Judgment: Judgment normal.      Disposition at Discharge: Discharge disposition: 01-Home or Self Care       Discharge Orders: Discharge Instructions    Discharge patient   Complete by: As directed    Discharge disposition: 01-Home or Self Care   Discharge patient date: 10/15/2020      Condition at Discharge:   Stable  Time spent on Discharge:  Greater than 30 minutes.  Signed:10/15/2020, 4:36 PM   [name]  APRN, MSN, FNP-C [facility] [address] [place], [place] [address] [phone]

## 2020-10-15 NOTE — Discharge Instructions (Signed)
Sickle Cell Anemia, Adult  Sickle cell anemia is a condition where your red blood cells are shaped like sickles. Red blood cells carry oxygen through the body. Sickle-shaped cells do not live as long as normal red blood cells. They also clump together and block blood from flowing through the blood vessels. This prevents the body from getting enough oxygen. Sickle cell anemia causes organ damage and pain. It also increases the risk of infection. Follow these instructions at home: Medicines  Take over-the-counter and prescription medicines only as told by your doctor.  If you were prescribed an antibiotic medicine, take it as told by your doctor. Do not stop taking the antibiotic even if you start to feel better.  If you develop a fever, do not take medicines to lower the fever right away. Tell your doctor about the fever. Managing pain, stiffness, and swelling  Try these methods to help with pain: ? Use a heating pad. ? Take a warm bath. ? Distract yourself, such as by watching TV. Eating and drinking  Drink enough fluid to keep your pee (urine) clear or pale yellow. Drink more in hot weather and during exercise.  Limit or avoid alcohol.  Eat a healthy diet. Eat plenty of fruits, vegetables, whole grains, and lean protein.  Take vitamins and supplements as told by your doctor. Traveling  When traveling, keep these with you: ? Your medical information. ? The names of your doctors. ? Your medicines.  If you need to take an airplane, talk to your doctor first. Activity  Rest often.  Avoid exercises that make your heart beat much faster, such as jogging. General instructions  Do not use products that have nicotine or tobacco, such as cigarettes and e-cigarettes. If you need help quitting, ask your doctor.  Consider wearing a medical alert bracelet.  Avoid being in high places (high altitudes), such as mountains.  Avoid very hot or cold temperatures.  Avoid places where the  temperature changes a lot.  Keep all follow-up visits as told by your doctor. This is important. Contact a doctor if:  A joint hurts.  Your feet or hands hurt or swell.  You feel tired (fatigued). Get help right away if:  You have symptoms of infection. These include: ? Fever. ? Chills. ? Being very tired. ? Irritability. ? Poor eating. ? Throwing up (vomiting).  You feel dizzy or faint.  You have new stomach pain, especially on the left side.  You have a an erection (priapism) that lasts more than 4 hours.  You have numbness in your arms or legs.  You have a hard time moving your arms or legs.  You have trouble talking.  You have pain that does not go away when you take medicine.  You are short of breath.  You are breathing fast.  You have a long-term cough.  You have pain in your chest.  You have a bad headache.  You have a stiff neck.  Your stomach looks bloated even though you did not eat much.  Your skin is pale.  You suddenly cannot see well. Summary  Sickle cell anemia is a condition where your red blood cells are shaped like sickles.  Follow your doctor's advice on ways to manage pain, food to eat, activities to do, and steps to take for safe travel.  Get medical help right away if you have any signs of infection, such as a fever. This information is not intended to replace advice given to you by   your health care provider. Make sure you discuss any questions you have with your health care provider. Document Revised: 12/14/2019 Document Reviewed: 12/14/2019 Elsevier Patient Education  2021 Elsevier Inc.  

## 2020-10-15 NOTE — Telephone Encounter (Signed)
Error

## 2020-10-15 NOTE — Telephone Encounter (Signed)
Patient called, requesting to come to the day hospital due to pain in the lower back and legs rated at 8/10. Denied chest pain, fever, diarrhea, abdominal pain, nausea/vomitting. Screened negative for Covid-19 symptoms. Admitted to having means of transportation without driving self after treatment. Last took 15 mg of oxycodone at 05:00 am today. Per provider, patient can come to the day hospital for treatment. Patient notified, verbalized understanding.

## 2020-10-22 ENCOUNTER — Ambulatory Visit: Payer: Medicaid Other | Admitting: Family Medicine

## 2020-10-25 ENCOUNTER — Other Ambulatory Visit: Payer: Self-pay

## 2020-10-25 ENCOUNTER — Encounter: Payer: Self-pay | Admitting: Family Medicine

## 2020-10-25 ENCOUNTER — Ambulatory Visit: Payer: Medicaid Other | Admitting: Clinical

## 2020-10-25 ENCOUNTER — Ambulatory Visit (INDEPENDENT_AMBULATORY_CARE_PROVIDER_SITE_OTHER): Payer: Medicaid Other | Admitting: Family Medicine

## 2020-10-25 VITALS — BP 119/78 | HR 68 | Temp 98.8°F | Ht 63.0 in | Wt 117.0 lb

## 2020-10-25 DIAGNOSIS — F419 Anxiety disorder, unspecified: Secondary | ICD-10-CM | POA: Diagnosis not present

## 2020-10-25 DIAGNOSIS — Z09 Encounter for follow-up examination after completed treatment for conditions other than malignant neoplasm: Secondary | ICD-10-CM

## 2020-10-25 DIAGNOSIS — D571 Sickle-cell disease without crisis: Secondary | ICD-10-CM

## 2020-10-25 DIAGNOSIS — Z79891 Long term (current) use of opiate analgesic: Secondary | ICD-10-CM | POA: Diagnosis not present

## 2020-10-25 DIAGNOSIS — G894 Chronic pain syndrome: Secondary | ICD-10-CM | POA: Diagnosis not present

## 2020-10-25 DIAGNOSIS — Z30013 Encounter for initial prescription of injectable contraceptive: Secondary | ICD-10-CM

## 2020-10-25 DIAGNOSIS — D5700 Hb-SS disease with crisis, unspecified: Secondary | ICD-10-CM

## 2020-10-25 DIAGNOSIS — F32A Depression, unspecified: Secondary | ICD-10-CM

## 2020-10-25 DIAGNOSIS — Z3009 Encounter for other general counseling and advice on contraception: Secondary | ICD-10-CM

## 2020-10-25 MED ORDER — XTAMPZA ER 9 MG PO C12A
9.0000 mg | EXTENDED_RELEASE_CAPSULE | Freq: Two times a day (BID) | ORAL | 0 refills | Status: DC
Start: 2020-10-25 — End: 2020-10-25

## 2020-10-25 MED ORDER — XTAMPZA ER 13.5 MG PO C12A: 13.5000 mg | EXTENDED_RELEASE_CAPSULE | Freq: Two times a day (BID) | ORAL | 0 refills | Status: AC

## 2020-10-25 MED ORDER — KETOROLAC TROMETHAMINE 30 MG/ML IJ SOLN
30.0000 mg | Freq: Once | INTRAMUSCULAR | Status: AC
  Administered 2020-10-25: 30 mg via INTRAMUSCULAR

## 2020-10-25 MED ORDER — MEDROXYPROGESTERONE ACETATE 150 MG/ML IM SUSP
150.0000 mg | Freq: Once | INTRAMUSCULAR | Status: AC
  Administered 2020-10-25: 150 mg via INTRAMUSCULAR

## 2020-10-25 MED ORDER — SERTRALINE HCL 50 MG PO TABS: 50.0000 mg | ORAL_TABLET | Freq: Every day | ORAL | 11 refills | Status: DC

## 2020-10-25 NOTE — Progress Notes (Signed)
Patient [facility] Follow Up   Subjective:  Patient ID: Sandra Mckay, female    DOB: March 23, 1996  Age: 25 y.o. MRN: [id number]  CC:  Chief Complaint  Patient presents with  . Chest Pain    Pt was just at the  ED at [facility], having chest pain , pt needs a refill on meds, she wants to know if she can get a refill on her zoloft here     HPI Sandra Mckay is a 25 year old female who presents for Hospital Follow Up today.   Current Status: Since her last office visit, she is doing well with no complaints. She states that she has chronic pain in her hips and legs. She rates her pain today at 8/10. She has not had a hospital visit for Sickle Cell Crisis since 10/15/2020 where she wss treated and discharged the same day. Her last admission to [facility] was 10/25/2020. She is currently taking all medications as prescribed and staying well hydrated. She reports occasional nausea, constipation, dizziness and headaches. She last took pain medications yesterday. Her anxiety is increased today and lately. She has not been able to follow up because of her parental obligation, healthcare status, family, personal, and other issues. She denies fevers, chills, fatigue, recent infections, weight loss, and night sweats. She has not had any visual changes, and falls. No chest pain, heart palpitations, cough and shortness of breath reported. Denies GI problems such as vomiting, and diarrhea. She has no reports of blood in stools, dysuria and hematuria. She is taking all medications as prescribed. She denies pain today.   Past Medical History:  Diagnosis Date  . Blood transfusion without reported diagnosis   . Bronchitis   . Chickenpox   . Depression   . Elevated ferritin level 05/2019  . Heart murmur   . Pulmonary hypertension ([place])   . Sickle cell anemia (H[facility])   . Sickle cell disease, type SS ([place])   . Thrombocytosis 05/2019  . Urinary  tract infection   . Vitamin D deficiency     Past Surgical History:  Procedure Laterality Date  . CHOLECYSTECTOMY  2011  . EYE SURGERY     Sty removal  . LABIAL ADHESION LYSIS  1999  . 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 TONSILLECTOMY  2012    Family History  Problem Relation Age of Onset  . Arthritis Other        grandparent  . Stroke Other   . Hypertension Other   . Diabetes Other        grandparent  . Cancer - Other Other        Glioblastoma    Social History   Socioeconomic History  . Marital status: Single    Spouse name: Not on file  . Number of children: Not on file  . Years of education: Not on file  . Highest education level: Not on file  Occupational History  . Not on file  Tobacco Use  . Smoking status: Former Research scientist (life sciences)  . Smokeless tobacco: Never Used  Vaping Use  . Vaping Use: Never used  Substance and Sexual Activity  . Alcohol use: Yes    Alcohol/week: 0.0 standard drinks    Comment: occ  . Drug use: No  . Sexual activity: Yes  Other Topics Concern  . Not on file  Social History Narrative   Lives  with mom in a one story home.  Education: 2 years of college.    Social Determinants of Health   Financial Resource Strain: Not on file  Food Insecurity: Not on file  Transportation Needs: Not on file  Physical Activity: Not on file  Stress: Not on file  Social Connections: Not on file  Intimate Partner Violence: Not on file    Outpatient Medications Prior to Visit  Medication Sig Dispense Refill  . folic acid (FOLVITE) 1 MG tablet Take 1 tablet (1 mg total) by mouth daily. 90 tablet 3  . hydroxyurea (HYDREA) 500 MG capsule TAKE 3 CAPSULES (1,500 MG TOTAL) BY MOUTH DAILY. 90 capsule 6  . ibuprofen (ADVIL) 800 MG tablet Take 1 tablet (800 mg total) by mouth every 8 (eight) hours as needed. 60 tablet 6  . megestrol (MEGACE) 40 MG tablet Take 1 tablet (40 mg total) by mouth daily. 30 tablet 11  . ondansetron  (ZOFRAN) 4 MG tablet Take 1 tablet (4 mg total) by mouth every 8 (eight) hours as needed for nausea or vomiting. 20 tablet 5  . oxyCODONE (ROXICODONE) 15 MG immediate release tablet Take 1 tablet (15 mg total) by mouth every 4 (four) hours as needed for up to 15 days for pain. 90 tablet 0  . tiZANidine (ZANAFLEX) 4 MG capsule Take 1 capsule (4 mg total) by mouth 3 (three) times daily as needed for muscle spasms. 90 capsule 6  . Vitamin D, Ergocalciferol, (DRISDOL) 1.25 MG (50000 UNIT) CAPS capsule Take 1 capsule (50,000 Units total) by mouth every Saturday. Saturday. 5 capsule 5  . sertraline (ZOLOFT) 25 MG tablet Take 25 mg by mouth daily.     No facility-administered medications prior to visit.    Allergies  Allergen Reactions  . Latex Rash    ROS Review of Systems  Constitutional: Negative.   HENT: Negative.   Eyes: Negative.   Respiratory: Negative.   Cardiovascular: Negative.   Gastrointestinal: Positive for constipation (occasional ) and nausea (occasional ).  Endocrine: Negative.   Genitourinary: Negative.   Musculoskeletal: Positive for arthralgias (generalized joint pain).  Skin: Negative.   Allergic/Immunologic: Negative.   Neurological: Positive for dizziness (occasional ) and headaches (occasional ).  Hematological: Negative.   Psychiatric/Behavioral: Negative.       Objective:    Physical Exam Vitals and nursing note reviewed.  Constitutional:      Appearance: Normal appearance. She is well-developed.  HENT:     Head: Normocephalic and atraumatic.     Nose: Nose normal.     Mouth/Throat:     Mouth: Mucous membranes are moist.     Pharynx: Oropharynx is clear.  Cardiovascular:     Rate and Rhythm: Normal rate and regular rhythm.     Pulses: Normal pulses.     Heart sounds: Normal heart sounds.  Pulmonary:     Effort: Pulmonary effort is normal.     Breath sounds: Normal breath sounds.  Abdominal:     General: Bowel sounds are normal.     Palpations:  Abdomen is soft.  Musculoskeletal:        General: Normal range of motion.     Cervical back: Normal range of motion and neck supple.  Skin:    General: Skin is warm and dry.  Neurological:     General: No focal deficit present.     Mental Status: She is alert and oriented to person, place, and time.  Psychiatric:  Mood and Affect: Mood normal.        Behavior: Behavior normal.        Thought Content: Thought content normal.        Judgment: Judgment normal.     BP 119/78 (BP Location: Right Arm, Patient Position: Sitting, Cuff Size: Normal)   Pulse 68   Temp 98.8 F (37.1 C) (Temporal)   Ht 5\' 3"  (1.6 m)   Wt 117 lb (53.1 kg)   LMP 10/07/2020   SpO2 97%   BMI 20.73 kg/m  Wt Readings from Last 3 Encounters:  10/25/20 117 lb (53.1 kg)  09/27/20 117 lb 6.4 oz (53.3 kg)  08/06/20 117 lb (53.1 kg)     Health Maintenance Due  Topic Date Due  . Hepatitis C Screening  Never done  . COVID-19 Vaccine (1) Never done  . PAP-Cervical Cytology Screening  Never done  . PAP SMEAR-Modifier  09/01/2017    There are no preventive care reminders to display for this patient.  No results found for: TSH Lab Results  Component Value Date   WBC 7.4 10/15/2020   HGB 7.6 (L) 10/15/2020   HCT 21.7 (L) 10/15/2020   MCV 108.0 (H) 10/15/2020   PLT 434 (H) 10/15/2020   Lab Results  Component Value Date   NA 140 10/15/2020   K 3.8 10/15/2020   CO2 23 10/15/2020   GLUCOSE 88 10/15/2020   BUN 7 10/15/2020   CREATININE 0.47 10/15/2020   BILITOT 2.3 (H) 10/15/2020   ALKPHOS 66 10/15/2020   AST 23 10/15/2020   ALT 16 10/15/2020   PROT 6.9 10/15/2020   ALBUMIN 4.6 10/15/2020   CALCIUM 9.2 10/15/2020   ANIONGAP 9 10/15/2020   No results found for: CHOL No results found for: HDL No results found for: LDLCALC No results found for: TRIG No results found for: CHOLHDL No results found for: HGBA1C    Assessment & Plan:    1. Hb-SS disease without crisis [facility]) She is  experiencing increase joint pain today r/t her chronic pain management. She will continue to take pain medications as prescribed; will continue to avoid extreme heat and cold; will continue to eat a healthy diet and drink at least 64 ounces of water daily; continue stool softener as needed; will avoid colds and flu; will continue to get plenty of sleep and rest; will continue to avoid high stressful situations and remain infection free; will continue Folic Acid 1 mg daily to avoid sickle cell crisis. Continue to follow up with Hematologist as needed.  - oxyCODONE ER (XTAMPZA ER) 13.5 MG C12A; Take 13.5 mg by mouth in the morning and at bedtime.  Dispense: 60 capsule; Refill: 0 - ketorolac (TORADOL) 30 MG/ML injection 30 mg  2. Chronic prescription opiate use - oxyCODONE ER (XTAMPZA ER) 13.5 MG C12A; Take 13.5 mg by mouth in the morning and at bedtime.  Dispense: 60 capsule; Refill: 0  3. Chronic pain syndrome - oxyCODONE ER (XTAMPZA ER) 13.5 MG C12A; Take 13.5 mg by mouth in the morning and at bedtime.  Dispense: 60 capsule; Refill: 0 - ketorolac (TORADOL) 30 MG/ML injection 30 mg  4. Anxiety - sertraline (ZOLOFT) 50 MG tablet; Take 1 tablet (50 mg total) by mouth daily.  Dispense: 30 tablet; Refill: 11 - Ambulatory referral to Psychiatry  5. Initiation of depo provera  - medroxyPROGESTERone (DEPO-PROVERA) injection 150 mg  6. Counseling for initiation of birth control method  7. Follow up She will follow up in 2  months.   Meds ordered this encounter  Medications  . DISCONTD: oxyCODONE ER (XTAMPZA ER) 9 MG C12A    Sig: Take 9 mg by mouth every 12 (twelve) hours.    Dispense:  60 capsule    Refill:  0    Order Specific Question:   Supervising Provider    Answer:   Tresa Garter [id number]  . sertraline (ZOLOFT) 50 MG tablet    Sig: Take 1 tablet (50 mg total) by mouth daily.    Dispense:  30 tablet    Refill:  11  . oxyCODONE ER (XTAMPZA ER) 13.5 MG C12A    Sig: Take 13.5 mg  by mouth in the morning and at bedtime.    Dispense:  60 capsule    Refill:  0    **Dose Increase**    Order Specific Question:   Supervising Provider    Answer:   Tresa Garter [id number]  . ketorolac (TORADOL) 30 MG/ML injection 30 mg  . medroxyPROGESTERone (DEPO-PROVERA) injection 150 mg   Orders Placed This Encounter  Procedures  . Ambulatory referral to Psychiatry     Referral Orders     Ambulatory referral to Psychiatry    [name], MSN, ANE, FNP-BC [place], [place] [address] [phone] fax  Problem List Items Addressed This Visit   None   Visit Diagnoses    Hb-SS disease without crisis [facility])    -  Primary   Relevant Medications   oxyCODONE ER (XTAMPZA ER) 13.5 MG C12A   ketorolac (TORADOL) 30 MG/ML injection 30 mg (Completed)   Chronic prescription opiate use       Relevant Medications   oxyCODONE ER (XTAMPZA ER) 13.5 MG C12A   Chronic pain syndrome       Relevant Medications   sertraline (ZOLOFT) 50 MG tablet   oxyCODONE ER (XTAMPZA ER) 13.5 MG C12A   ketorolac (TORADOL) 30 MG/ML injection 30 mg (Completed)   Anxiety       Relevant Medications   sertraline (ZOLOFT) 50 MG tablet   Other Relevant Orders   Ambulatory referral to Psychiatry   BCP (birth control pills) initiation       Relevant Medications   medroxyPROGESTERone (DEPO-PROVERA) injection 150 mg (Completed)   Counseling for initiation of birth control method       Follow up          Meds ordered this encounter  Medications  . DISCONTD: oxyCODONE ER (XTAMPZA ER) 9 MG C12A    Sig: Take 9 mg by mouth every 12 (twelve) hours.    Dispense:  60 capsule    Refill:  0    Order Specific Question:   Supervising Provider    Answer:   Tresa Garter [id number]  . sertraline (ZOLOFT) 50 MG tablet    Sig: Take 1 tablet (50 mg total) by mouth daily.     Dispense:  30 tablet    Refill:  11  . oxyCODONE ER (XTAMPZA ER) 13.5 MG C12A    Sig: Take 13.5 mg by mouth in the morning and at bedtime.    Dispense:  60 capsule    Refill:  0    **Dose Increase**    Order Specific Question:   Supervising Provider    Answer:   Tresa Garter [id number]  . ketorolac (TORADOL) 30 MG/ML injection 30 mg  . medroxyPROGESTERone (DEPO-PROVERA) injection 150  mg    Follow-up: No follow-ups on file.    Azzie Glatter, FNP

## 2020-10-25 NOTE — BH Specialist Note (Signed)
[facility] General Follow Up Note  10/25/2020 Name: Sandra Mckay MRN: [id number] DOB: 22-Jun-1996 Sandra Mckay is a 25 y.o. year old female who sees Azzie Glatter, FNP for primary care. LCSW was initially consulted to assist the patient with Mental Health Counseling and Resources.  Interpreter: No.   Interpreter Name & Language: none  Assessment: Patient experiencing experiencing anxiety and depression.  Ongoing Intervention: Today [name] met with patient breifly after PCP visit. Was scheduled for [place] visit, but patient did not have time for full visit.   Brief assessment today. Patient experiencing anxiety and depression and would like to have counseling. She would also like to see a psychiatrist for medication management; advised PCP of this and PCP to make referral. Mindfulness exercise completed today. [name] and patient to follow up on 11/01/20.   Review of patient status, including review of consultants reports, relevant laboratory and other test results, and collaboration with appropriate care team members and the patient's provider was performed as part of comprehensive patient evaluation and provision of services.     Follow up Plan: 1. [place] ([facility]) MyChart visit 11/01/20  [name], [facility] [phone]

## 2020-10-29 ENCOUNTER — Other Ambulatory Visit: Payer: Self-pay

## 2020-10-29 ENCOUNTER — Encounter: Payer: Self-pay | Admitting: Family Medicine

## 2020-10-30 ENCOUNTER — Other Ambulatory Visit: Payer: Self-pay | Admitting: Family Medicine

## 2020-10-30 ENCOUNTER — Telehealth: Payer: Self-pay | Admitting: Family Medicine

## 2020-10-30 DIAGNOSIS — Z79891 Long term (current) use of opiate analgesic: Secondary | ICD-10-CM

## 2020-10-30 DIAGNOSIS — D571 Sickle-cell disease without crisis: Secondary | ICD-10-CM

## 2020-10-30 DIAGNOSIS — G894 Chronic pain syndrome: Secondary | ICD-10-CM

## 2020-10-30 MED ORDER — OXYCODONE HCL 15 MG PO TABS
15.0000 mg | ORAL_TABLET | ORAL | 0 refills | Status: DC | PRN
Start: 2020-11-02 — End: 2020-11-14

## 2020-10-31 NOTE — Telephone Encounter (Signed)
DOne

## 2020-11-01 ENCOUNTER — Other Ambulatory Visit: Payer: Self-pay

## 2020-11-01 ENCOUNTER — Ambulatory Visit (INDEPENDENT_AMBULATORY_CARE_PROVIDER_SITE_OTHER): Payer: Medicaid Other | Admitting: Clinical

## 2020-11-01 DIAGNOSIS — F419 Anxiety disorder, unspecified: Secondary | ICD-10-CM | POA: Diagnosis not present

## 2020-11-01 DIAGNOSIS — F3289 Other specified depressive episodes: Secondary | ICD-10-CM
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
## 2020-11-04 NOTE — Progress Notes (Signed)
Order(s) created erroneously. Erroneous order ID: [id number]  Order moved by: Genia Harold D  Order move date/time: 11/04/2020 11:41 AM  Source Patient: [id number]  Source Contact: 10/25/2020  Destination Patient: [id number]  Destination Contact: 03/21/2020

## 2020-11-06 ENCOUNTER — Encounter: Payer: Self-pay | Admitting: Family Medicine

## 2020-11-08 ENCOUNTER — Encounter: Payer: Self-pay | Admitting: Clinical

## 2020-11-08 ENCOUNTER — Ambulatory Visit (INDEPENDENT_AMBULATORY_CARE_PROVIDER_SITE_OTHER): Payer: Medicaid Other | Admitting: Clinical

## 2020-11-08 ENCOUNTER — Encounter: Payer: Medicaid Other | Admitting: Clinical

## 2020-11-08 DIAGNOSIS — F3289 Other specified depressive episodes: Secondary | ICD-10-CM

## 2020-11-08 DIAGNOSIS — F419 Anxiety disorder, unspecified: Secondary | ICD-10-CM | POA: Diagnosis not present

## 2020-11-08 NOTE — BH Specialist Note (Signed)
Integrated Behavioral Health via Telemedicine Visit  11/08/2020 Sandra Mckay [id number]  Number of [place] visits: 1/20 Session Start time: 10:05  Session End time: 11:05 Total time: 60  Referring Provider: [name], NP Patient/Family location: [place], [place] [facility] Provider location: Patient [place] All persons participating in visit: [name], patient Types of Service: Individual psychotherapy and Telephone visit  I connected with Sandra Mckay via Telephone and [place] and verified that I am speaking with the correct person using two identifiers. Discussed confidentiality: Yes    *Video application was not working correctly today. Was able to see video for a few moments, but majority of the visit was audio only.   I discussed the limitations of telemedicine and the availability of in person appointments.  Discussed there is a possibility of technology failure and discussed alternative modes of communication if that failure occurs.  I discussed that engaging in this telemedicine visit, they consent to the provision of behavioral healthcare and the services will be billed under their insurance.  Patient and/or legal guardian expressed understanding and consented to Telemedicine visit: Yes   Presenting Concerns: Patient and/or family reports the following symptoms/concerns: Low motivation for ADLs, depression after being let go from job, anxiety Duration of problem: several years; Severity of problem: moderate  Patient and/or Family's Strengths/Protective Factors: Social connections, Social and Emotional competence and Concrete supports in place (healthy food, safe environments, etc.)  Goals Addressed: Patient will: 1.  Reduce symptoms of: anxiety and depression  2.  Increase knowledge and/or ability of: coping skills and self-management skills  3.  Demonstrate ability to: Increase  healthy adjustment to current life circumstances  Progress towards Goals: Ongoing  Interventions: Interventions utilized:  CBT Cognitive Behavioral Therapy and Supportive Counseling Standardized Assessments completed: Not Needed   CBT today to explore thoughts about self in context of mental health challenges. Discussion about negative messaging from others and connection to beliefs about self. Discussed interpersonal boundaries in communication with others. Supportive reflection on patient's strengths and adaptive strategies she uses in interpersonal interactions.   Patient and/or Family Response: Patient engaged in session. Patient exhibits insight into interpersonal dynamics and her own personal strengths.  Assessment: Patient currently experiencing depression and anxiety which is exacerbated by interpersonal/family dynamics as well as chronic illness. Patient also recently experienced the loss of a job.  Patient may benefit from supportive counseling including CBT to explore unhelpful thoughts about self in context of illness and in relationships. Patient may also benefit from mindfulness and mindful movement for coping with elevated anxiety and emotions.  Plan: 1. Follow up with behavioral health clinician on: 11/13/20 2. Referral(s): [place] (In Clinic)  I discussed the assessment and treatment plan with the patient and/or parent/guardian. They were provided an opportunity to ask questions and all were answered. They agreed with the plan and demonstrated an understanding of the instructions.   They were advised to call back or seek an in-person evaluation if the symptoms worsen or if the condition fails to improve as anticipated.  [name], [facility] [phone]

## 2020-11-13 ENCOUNTER — Ambulatory Visit (INDEPENDENT_AMBULATORY_CARE_PROVIDER_SITE_OTHER): Payer: Medicaid Other | Admitting: Clinical

## 2020-11-13 ENCOUNTER — Other Ambulatory Visit: Payer: Self-pay

## 2020-11-13 DIAGNOSIS — F3289 Other specified depressive episodes: Secondary | ICD-10-CM

## 2020-11-13 DIAGNOSIS — F419 Anxiety disorder, unspecified: Secondary | ICD-10-CM | POA: Diagnosis not present

## 2020-11-13 NOTE — BH Specialist Note (Signed)
Integrated Behavioral Health via Telemedicine Visit  11/13/2020 Sandra Mckay [id number]  Number of [place] visits: 2/20 Session Start time: 11:05  Session End time: 12:00 Total time: 55   Referring Provider: [name], NP Patient/Family location: [place], [facility] Provider location: Patient [place] All persons participating in visit: [name], patient Types of Service: Individual psychotherapy and Video visit  I connected with Sandra Mckay via Clinical biochemist and verified that I am speaking with the correct person using two identifiers. Discussed confidentiality: Yes   I discussed the limitations of telemedicine and the availability of in person appointments.  Discussed there is a possibility of technology failure and discussed alternative modes of communication if that failure occurs.  I discussed that engaging in this telemedicine visit, they consent to the provision of behavioral healthcare and the services will be billed under their insurance.  Patient and/or legal guardian expressed understanding and consented to Telemedicine visit: Yes   Presenting Concerns: Patient and/or family reports the following symptoms/concerns: Low motivation for ADLs, depression after being let go from job, anxiety Duration of problem: several years; Severity of problem: moderate  Patient and/or Family's Strengths/Protective Factors: Social connections, Social and Emotional competence and Concrete supports in place (healthy food, safe environments, etc.)  Goals Addressed: Patient will: 1.  Reduce symptoms of: anxiety and depression  2.  Increase knowledge and/or ability of: coping skills and self-management skills  3.  Demonstrate ability to: Increase healthy adjustment to current life circumstances  Progress towards Goals: Ongoing  Interventions: Interventions utilized:  CBT Cognitive Behavioral Therapy  and Supportive Counseling Standardized Assessments completed: Not Needed   Supportive counseling today regarding patient mood and stressors. 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 She has lower motivation to engage in valued activities due to depression. CBT to explore some thoughts and actions that result from thoughts about self in context of illness. Patient reports minimal social support. Invited patient to upcoming sickle cell support group at the [facility]) as a source of social support. Touched briefly on behavioral activation to address low motivation for valued activities.   Patient and/or Family Response: Patient engaged in session. Patient exhibits insight into interpersonal dynamics and her own personal strengths.  Assessment: Patient currently experiencing depression and anxiety which is exacerbated by interpersonal/family dynamics as well as chronic illness. Patient also recently experienced the loss of a job.  Patient may benefit from supportive counseling including CBT to explore unhelpful thoughts about self in context of illness and in relationships. Patient may also benefit from mindfulness and mindful movement for coping with elevated anxiety and emotions.  Plan: 1. Follow up with behavioral health clinician on: 11/25/20 2. Referral(s): [place] (In Clinic)  I discussed the assessment and treatment plan with the patient and/or parent/guardian. They were provided an opportunity to ask questions and all were answered. They agreed with the plan and demonstrated an understanding of the instructions.   They were advised to call back or seek an in-person evaluation if the symptoms worsen or if the condition fails to improve as anticipated.  [name], [facility] [phone]

## 2020-11-14 ENCOUNTER — Other Ambulatory Visit: Payer: Self-pay | Admitting: Family Medicine

## 2020-11-14 ENCOUNTER — Telehealth: Payer: Self-pay

## 2020-11-14 DIAGNOSIS — Z79891 Long term (current) use of opiate analgesic: Secondary | ICD-10-CM

## 2020-11-14 DIAGNOSIS — G894 Chronic pain syndrome: Secondary | ICD-10-CM

## 2020-11-14 DIAGNOSIS — D571 Sickle-cell disease without crisis: Secondary | ICD-10-CM

## 2020-11-14 MED ORDER — OXYCODONE HCL 15 MG PO TABS: 15.0000 mg | ORAL_TABLET | ORAL | 0 refills | Status: DC | PRN

## 2020-11-14 NOTE — Telephone Encounter (Signed)
Oxycodone 15 mg 

## 2020-11-14 NOTE — Progress Notes (Signed)
Reviewed PDMP substance reporting system prior to prescribing opiate medications. No inconsistencies noted.   Meds ordered this encounter  Medications  . oxyCODONE (ROXICODONE) 15 MG immediate release tablet    Sig: Take 1 tablet (15 mg total) by mouth every 4 (four) hours as needed for up to 15 days for pain.    Dispense:  90 tablet    Refill:  0    Please do not refill this medication prior to 11/16/2020. Thank you.    Order Specific Question:   Supervising Provider    Answer:   Tresa Garter [[id number]]    [name]  APRN, MSN, FNP-C Patient [place] [address] [place], [place] [address] [phone]

## 2020-11-18 ENCOUNTER — Other Ambulatory Visit: Payer: Self-pay | Admitting: Family Medicine

## 2020-11-18 DIAGNOSIS — F419 Anxiety disorder, unspecified: Secondary | ICD-10-CM

## 2020-11-25 ENCOUNTER — Ambulatory Visit (INDEPENDENT_AMBULATORY_CARE_PROVIDER_SITE_OTHER): Payer: Medicaid Other | Admitting: Clinical

## 2020-11-25 ENCOUNTER — Other Ambulatory Visit: Payer: Self-pay | Admitting: Family Medicine

## 2020-11-25 ENCOUNTER — Telehealth: Payer: Self-pay

## 2020-11-25 ENCOUNTER — Other Ambulatory Visit: Payer: Self-pay

## 2020-11-25 DIAGNOSIS — F3289 Other specified depressive episodes: Secondary | ICD-10-CM | POA: Diagnosis not present

## 2020-11-25 DIAGNOSIS — Z79891 Long term (current) use of opiate analgesic: Secondary | ICD-10-CM

## 2020-11-25 DIAGNOSIS — F419 Anxiety disorder, unspecified: Secondary | ICD-10-CM

## 2020-11-25 DIAGNOSIS — D571 Sickle-cell disease without crisis: Secondary | ICD-10-CM

## 2020-11-25 MED ORDER — XTAMPZA ER 13.5 MG PO C12A: 1.0000 | EXTENDED_RELEASE_CAPSULE | Freq: Two times a day (BID) | ORAL | 0 refills | Status: DC

## 2020-11-25 NOTE — Progress Notes (Signed)
Reviewed PDMP substance reporting system prior to prescribing opiate medications. No inconsistencies noted.   Meds ordered this encounter  Medications  . oxyCODONE ER (XTAMPZA ER) 13.5 MG C12A    Sig: Take 1 capsule by mouth every 12 (twelve) hours.    Dispense:  60 capsule    Refill:  0    Order Specific Question:   Supervising Provider    Answer:   Tresa Garter [[id number]]     [name]  APRN, MSN, FNP-C Patient [place] [address] [place], [place] [address] [phone]

## 2020-11-25 NOTE — BH Specialist Note (Signed)
Integrated Behavioral Health via Telemedicine Visit  11/25/2020 Cris Gibby [id number]  Number of [place] visits: 3/20 Session Start time: 12:20  Session End time: 1:20 Total time: 60  Referring Provider: [name], NP Patient/Family location: [place], [facility] Provider location: Patient [place] All persons participating in visit: [name], patient Types of Service: Individual psychotherapy and Telephone visit  I connected with [name] via Telephone and verified that I am speaking with the correct person using two identifiers. Discussed confidentiality: Yes   I discussed the limitations of telemedicine and the availability of in person appointments.  Discussed there is a possibility of technology failure and discussed alternative modes of communication if that failure occurs.  I discussed that engaging in this telemedicine visit, they consent to the provision of behavioral healthcare and the services will be billed under their insurance.  Patient and/or legal guardian expressed understanding and consented to Telemedicine visit: Yes   Presenting Concerns: Patient and/or family reports the following symptoms/concerns: Low motivation for ADLs, depression after being let go from job, anxiety Duration of problem: several years; Severity of problem: moderate  Patient and/or Family's Strengths/Protective Factors: Social connections, Social and Emotional competence and Concrete supports in place (healthy food, safe environments, etc.)  Goals Addressed: Patient will: 1.  Reduce symptoms of: anxiety and depression  2.  Increase knowledge and/or ability of: self-management skills  3.  Demonstrate ability to: Increase healthy adjustment to current life circumstances  Progress towards Goals: Ongoing  Interventions: Interventions utilized:  CBT Cognitive Behavioral Therapy and Supportive Counseling Standardized Assessments  completed: Not Needed   Supportive counseling and CBT today. Patient tearful and talked about recent challenges with low mood and pain and the relationship between the two. Supportive counseling around this. Exploration of thoughts about the course of her problems with mood. Patient attributes difficulties to things she has gone through earlier in life, trauma and loss. Patient would like to process some of these experiences in future sessions. Made plan for patient to journal about triggers that lead to low mood. Patient already does some journaling about her day and her emotions. Encouraged continuing this also and encouraged patient to journal about any positive events and resulting emotions.  Patient endorsed thoughts of dying or of suicide. She denies having a plan or intention to act on these thoughts. [name] provided crisis resources: [place] [phone]; [facility] [phone]; Suicide Prevention Lifeline 757-447-8303; Crisis Text Line 715-606-8551.   Patient and/or Family Response: Patient engaged in session today. Tearful at times. Patient exhibits motivation to use new strategies to identify and address triggers for mood and anxiety problems. Patient motivated to continue engaging in sessions with [name].  Assessment: Patient currently experiencing depression and anxiety which is exacerbated by interpersonal/family dynamics as well as chronic illness. Patient also recently experienced the loss of a job.  Patient may benefit from supportivecounseling including CBT to explore unhelpful thoughts about self in context of illness and in relationships.Patient may also benefit from mindfulness and mindful movement for coping with elevated anxiety and emotions.  Plan: 1. Follow up with behavioral health clinician on: 12/05/20 2. Behavioral recommendations: journal about triggers and about positive experiences 3. Referral(s): [place] (In  Clinic)  I discussed the assessment and treatment plan with the patient and/or parent/guardian. They were provided an opportunity to ask questions and all were answered. They agreed with the plan and demonstrated an understanding of the instructions.   They were advised  to call back or seek an in-person evaluation if the symptoms worsen or if the condition fails to improve as anticipated.  [name], [facility] [phone]

## 2020-11-25 NOTE — Telephone Encounter (Signed)
Med refill xtampza 13 mg

## 2020-11-27 ENCOUNTER — Telehealth: Payer: Self-pay

## 2020-11-27 ENCOUNTER — Telehealth: Payer: Self-pay | Admitting: Family Medicine

## 2020-11-27 ENCOUNTER — Other Ambulatory Visit: Payer: Self-pay | Admitting: Family Medicine

## 2020-11-27 DIAGNOSIS — G894 Chronic pain syndrome: Secondary | ICD-10-CM

## 2020-11-27 DIAGNOSIS — Z79891 Long term (current) use of opiate analgesic: Secondary | ICD-10-CM

## 2020-11-27 DIAGNOSIS — D571 Sickle-cell disease without crisis: Secondary | ICD-10-CM

## 2020-11-27 MED ORDER — OXYCODONE HCL 15 MG PO TABS: 15.0000 mg | ORAL_TABLET | ORAL | 0 refills | Status: DC | PRN

## 2020-11-27 MED ORDER — XTAMPZA ER 13.5 MG PO C12A: 1.0000 | EXTENDED_RELEASE_CAPSULE | Freq: Two times a day (BID) | ORAL | 0 refills | Status: DC

## 2020-11-27 NOTE — Progress Notes (Signed)
Reviewed PDMP substance reporting system prior to prescribing opiate medications. No inconsistencies noted.   Meds ordered this encounter  Medications  . oxyCODONE (ROXICODONE) 15 MG immediate release tablet    Sig: Take 1 tablet (15 mg total) by mouth every 4 (four) hours as needed for up to 15 days for pain.    Dispense:  90 tablet    Refill:  0    Please do not refill this medication prior to 11/16/2020. Thank you.    Order Specific Question:   Supervising Provider    Answer:   Tresa Garter [id number]  . oxyCODONE ER (XTAMPZA ER) 13.5 MG C12A    Sig: Take 1 capsule by mouth every 12 (twelve) hours.    Dispense:  60 capsule    Refill:  0    Order Specific Question:   Supervising Provider    Answer:   Tresa Garter [[id number]]     [name]  APRN, MSN, FNP-C Patient [place] [address] [place], [place] [address] [phone]

## 2020-11-27 NOTE — Telephone Encounter (Signed)
Patient advised to schedule first available appointment.   Sandra Pounds  APRN, MSN, FNP-C Patient [place] [address] [place], [place] [address] [phone]

## 2020-11-27 NOTE — Telephone Encounter (Signed)
Xtampza 13 mg Oxycodone 15 mg  [place] patient would like a call from you.

## 2020-12-05 ENCOUNTER — Encounter: Payer: Self-pay | Admitting: Clinical

## 2020-12-10 ENCOUNTER — Other Ambulatory Visit: Payer: Self-pay | Admitting: Family Medicine

## 2020-12-10 ENCOUNTER — Telehealth: Payer: Self-pay

## 2020-12-10 DIAGNOSIS — Z79891 Long term (current) use of opiate analgesic: Secondary | ICD-10-CM

## 2020-12-10 DIAGNOSIS — G894 Chronic pain syndrome: Secondary | ICD-10-CM

## 2020-12-10 DIAGNOSIS — D571 Sickle-cell disease without crisis: Secondary | ICD-10-CM

## 2020-12-10 MED ORDER — OXYCODONE HCL 15 MG PO TABS: 15.0000 mg | ORAL_TABLET | ORAL | 0 refills | Status: DC | PRN

## 2020-12-10 NOTE — Progress Notes (Signed)
Reviewed PDMP substance reporting system prior to prescribing opiate medications. No inconsistencies noted.   Meds ordered this encounter  Medications  . oxyCODONE (ROXICODONE) 15 MG immediate release tablet    Sig: Take 1 tablet (15 mg total) by mouth every 4 (four) hours as needed for up to 15 days for pain.    Dispense:  90 tablet    Refill:  0    Please do not refill this medication prior to 11/16/2020. Thank you.    Order Specific Question:   Supervising Provider    Answer:   [name], [name]    Sandra Mckay Sandra Volkov  APRN, MSN, FNP-C [facility] [address]  [place], [place] [address] [phone]  

## 2020-12-10 NOTE — Telephone Encounter (Signed)
Med refill Oxycodone 15 mg 

## 2020-12-12 ENCOUNTER — Other Ambulatory Visit: Payer: Self-pay

## 2020-12-12 ENCOUNTER — Ambulatory Visit (INDEPENDENT_AMBULATORY_CARE_PROVIDER_SITE_OTHER): Payer: Medicaid Other | Admitting: Clinical

## 2020-12-12 DIAGNOSIS — F419 Anxiety disorder, unspecified: Secondary | ICD-10-CM | POA: Diagnosis not present

## 2020-12-12 DIAGNOSIS — F3289 Other specified depressive episodes: Secondary | ICD-10-CM

## 2020-12-12 NOTE — BH Specialist Note (Signed)
Integrated Behavioral Health via Telemedicine Visit  12/12/2020 Sandra Mckay [id number]  Number of [place] visits: 4/20 Session Start time: 1:05  Session End time: 1:35 Total time: 30  Referring Provider: [name], NP Patient/Family location: [place], [facility] Provider location: Patient [place] All persons participating in visit: [name], patient Types of Service: Individual psychotherapy and Telephone visit  I connected with Sandra Mckay via Telephone and verified that I am speaking with the correct person using two identifiers. Discussed confidentiality: Yes   I discussed the limitations of telemedicine and the availability of in person appointments.  Discussed there is a possibility of technology failure and discussed alternative modes of communication if that failure occurs.  I discussed that engaging in this telemedicine visit, they consent to the provision of behavioral healthcare and the services will be billed under their insurance.  Patient and/or legal guardian expressed understanding and consented to Telemedicine visit: Yes   Presenting Concerns: Patient and/or family reports the following symptoms/concerns: Low motivation for ADLs, depression after being let go from job, anxiety Duration of problem: several years; Severity of problem: moderate  Patient and/or Family's Strengths/Protective Factors: Social connections, Social and Emotional competence and Concrete supports in place (healthy food, safe environments, etc.)  Goals Addressed: Patient will: 1.  Increase knowledge and/or ability of: self-management skills   Progress towards Goals: Ongoing  Interventions: Interventions utilized:  Supportive Counseling Standardized Assessments completed: Not Needed   Supportive counseling today. Patient wants to improve routines and increase valued activities. Patient continues to work on identifying triggers for  low mood or anxiety. 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 [name] provided supportive counseling on breaking down the task into more manageable parts to increase motivation. Some supportive counseling and encouragement on continued journaling.  Patient and/or Family Response: Patient engaged in session.   Assessment: Patient currently experiencing depression and anxiety which is exacerbated by interpersonal/family dynamics as well as chronic illness. Patient also recently experienced the loss of a job.  Patient may benefit from supportivecounseling including CBT to explore unhelpful thoughts about self in context of illness and in relationships.Patient may also benefit from mindfulness and mindful movement for coping with elevated anxiety and emotions.  Plan: 1. Follow up with behavioral health clinician on: 12/18/20 2. Behavioral recommendations: journal about triggers and reactions 3. Referral(s): [facility] (In Clinic)  I discussed the assessment and treatment plan with the patient and/or parent/guardian. They were provided an opportunity to ask questions and all were answered. They agreed with the plan and demonstrated an understanding of the instructions.   They were advised to call back or seek an in-person evaluation if the symptoms worsen or if the condition fails to improve as anticipated.  [name], [facility] [phone]

## 2020-12-18 ENCOUNTER — Ambulatory Visit (INDEPENDENT_AMBULATORY_CARE_PROVIDER_SITE_OTHER): Payer: Medicaid Other | Admitting: Clinical

## 2020-12-18 ENCOUNTER — Other Ambulatory Visit: Payer: Self-pay

## 2020-12-18 DIAGNOSIS — F419 Anxiety disorder, unspecified: Secondary | ICD-10-CM

## 2020-12-18 DIAGNOSIS — F3289 Other specified depressive episodes: Secondary | ICD-10-CM

## 2020-12-18 NOTE — BH Specialist Note (Signed)
Integrated Behavioral Health via Telemedicine Visit  12/18/2020 Sandra Mckay [id number]  Number of [place] visits: 5/20 Session Start time: 1:45  Session End time: 2:45 Total time: 60  Referring Provider: [name], NP Patient/Family location: [place], [facility] Provider location: Patient [place] All persons participating in visit: [name], patient Types of Service: Individual psychotherapy and Telephone visit  I connected with [name] via Clinical biochemist and verified that I am speaking with the correct person using two identifiers. Discussed confidentiality: Yes   I discussed the limitations of telemedicine and the availability of in person appointments.  Discussed there is a possibility of technology failure and discussed alternative modes of communication if that failure occurs.  I discussed that engaging in this telemedicine visit, they consent to the provision of behavioral healthcare and the services will be billed under their insurance.  Patient and/or legal guardian expressed understanding and consented to Telemedicine visit: Yes   Presenting Concerns: Patient and/or family reports the following symptoms/concerns: Low motivation for ADLs, depression after being let go from job, anxiety Duration of problem: several years; Severity of problem: moderate  Patient and/or Family's Strengths/Protective Factors: Social connections, Social and Emotional competence and Concrete supports in place (healthy food, safe environments, etc.)  Goals Addressed: Patient will: 1.  Increase knowledge and/or ability of: self-management skills  2.  Demonstrate ability to: Increase healthy adjustment to current life circumstances  Progress towards Goals: Ongoing  Interventions: Interventions utilized:  CBT Cognitive Behavioral Therapy and Supportive Counseling Standardized Assessments completed:  Not Needed   Supportive counseling and CBT today as patient processed interpersonal conflict with her partner, who is also her daughter's father. Patient reported concerning behavior from him and is thinking about what it means for their relationship and his relationship with their daughter. Provided validation of emotions and reflective listening. Patient also briefly spoke about some journal entries she had made about triggers she noticed this week. CBT to process thoughts patient had about self in context of relationship with mother, as this is what she journaled about. Session was cut off by poor call quality on [employer], though session had lasted 60 minutes. Called patient x3 on the phone to try to reconnect but no answer, no VM available. [name] to follow up with patient.  Patient and/or Family Response: Patient engaged in session.   Assessment: Patient currently experiencing depression and anxiety which is exacerbated by interpersonal/family dynamics as well as chronic illness. Patient also recently experienced the loss of a job.  Patient may benefit from supportivecounseling including CBT to explore unhelpful thoughts about self in context of illness and in relationships.Patient may also benefit from mindfulness and mindful movement for coping with elevated anxiety and emotions.  Plan: 1. Follow up with behavioral health clinician on: [name] to follow up by phone next week. [name] out of office 5/19 and 5/20, but call today was cut off before a follow up appointment could be made. 2. Behavioral recommendations: journal about triggers and reactions 3. Referral(s): [place] (In Clinic)  I discussed the assessment and treatment plan with the patient and/or parent/guardian. They were provided an opportunity to ask questions and all were answered. They agreed with the plan and demonstrated an understanding of the instructions.   They were advised to call back or seek an  in-person evaluation if the symptoms worsen or if the condition fails to improve as anticipated.  Sandra Mckay, [facility] [phone]

## 2020-12-24 ENCOUNTER — Other Ambulatory Visit: Payer: Self-pay | Admitting: Family Medicine

## 2020-12-24 ENCOUNTER — Telehealth: Payer: Self-pay

## 2020-12-24 DIAGNOSIS — G894 Chronic pain syndrome: Secondary | ICD-10-CM

## 2020-12-24 DIAGNOSIS — D571 Sickle-cell disease without crisis: Secondary | ICD-10-CM

## 2020-12-24 DIAGNOSIS — Z79891 Long term (current) use of opiate analgesic: Secondary | ICD-10-CM

## 2020-12-24 MED ORDER — XTAMPZA ER 13.5 MG PO C12A: 1.0000 | EXTENDED_RELEASE_CAPSULE | Freq: Two times a day (BID) | ORAL | 0 refills | Status: DC

## 2020-12-24 MED ORDER — OXYCODONE HCL 15 MG PO TABS: 15.0000 mg | ORAL_TABLET | ORAL | 0 refills | Status: DC | PRN

## 2020-12-24 NOTE — Telephone Encounter (Signed)
Med refill Oxy  xtampza

## 2020-12-24 NOTE — Progress Notes (Signed)
Reviewed PDMP substance reporting system prior to prescribing opiate medications. No inconsistencies noted.   Meds ordered this encounter  Medications  . oxyCODONE ER (XTAMPZA ER) 13.5 MG C12A    Sig: Take 1 capsule by mouth every 12 (twelve) hours.    Dispense:  60 capsule    Refill:  0    Order Specific Question:   Supervising Provider    Answer:   Tresa Garter [id number]  . oxyCODONE (ROXICODONE) 15 MG immediate release tablet    Sig: Take 1 tablet (15 mg total) by mouth every 4 (four) hours as needed for up to 15 days for pain.    Dispense:  90 tablet    Refill:  0    Please do not refill this medication prior to 12/28/2020. Thank you.    Order Specific Question:   Supervising Provider    Answer:   Tresa Garter [[id number]]    [name]  APRN, MSN, FNP-C Patient [facility] [address] [place], [place] [address] [phone]

## 2020-12-27 ENCOUNTER — Encounter: Payer: Self-pay | Admitting: Clinical

## 2020-12-31 ENCOUNTER — Telehealth: Payer: Self-pay

## 2021-01-06 NOTE — Telephone Encounter (Signed)
error 

## 2021-01-08 ENCOUNTER — Other Ambulatory Visit: Payer: Self-pay | Admitting: Family Medicine

## 2021-01-08 ENCOUNTER — Telehealth: Payer: Self-pay

## 2021-01-08 DIAGNOSIS — D571 Sickle-cell disease without crisis: Secondary | ICD-10-CM

## 2021-01-08 DIAGNOSIS — G894 Chronic pain syndrome: Secondary | ICD-10-CM

## 2021-01-08 DIAGNOSIS — Z79891 Long term (current) use of opiate analgesic: Secondary | ICD-10-CM

## 2021-01-08 MED ORDER — OXYCODONE HCL 15 MG PO TABS: 15.0000 mg | ORAL_TABLET | ORAL | 0 refills | Status: DC | PRN

## 2021-01-08 NOTE — Telephone Encounter (Signed)
Oxycodone 15 mg 

## 2021-01-08 NOTE — Progress Notes (Signed)
Reviewed PDMP substance reporting system prior to prescribing opiate medications. No inconsistencies noted.   Meds ordered this encounter  Medications  . oxyCODONE (ROXICODONE) 15 MG immediate release tablet    Sig: Take 1 tablet (15 mg total) by mouth every 4 (four) hours as needed for up to 15 days for pain.    Dispense:  90 tablet    Refill:  0    Please do not refill this medication prior to 01/12/2021. Thank you.    Order Specific Question:   Supervising Provider    Answer:   Tresa Garter [[id number]]     [name]  APRN, MSN, FNP-C Patient [facility] [address] [place], [place] [address] [phone]

## 2021-01-14 ENCOUNTER — Encounter: Payer: Self-pay | Admitting: Clinical

## 2021-01-21 ENCOUNTER — Encounter: Payer: Self-pay | Admitting: Clinical

## 2021-01-21 ENCOUNTER — Ambulatory Visit: Payer: Self-pay

## 2021-01-22 ENCOUNTER — Ambulatory Visit: Payer: Self-pay | Admitting: Clinical

## 2021-01-22 ENCOUNTER — Ambulatory Visit: Payer: Self-pay

## 2021-01-23 ENCOUNTER — Ambulatory Visit (INDEPENDENT_AMBULATORY_CARE_PROVIDER_SITE_OTHER): Payer: Medicaid Other | Admitting: Nurse Practitioner

## 2021-01-23 ENCOUNTER — Other Ambulatory Visit: Payer: Self-pay

## 2021-01-23 DIAGNOSIS — Z3042 Encounter for surveillance of injectable contraceptive: Secondary | ICD-10-CM | POA: Diagnosis not present

## 2021-01-23 MED ORDER — MEDROXYPROGESTERONE ACETATE 150 MG/ML IM SUSP
150.0000 mg | Freq: Once | INTRAMUSCULAR | Status: AC
  Administered 2021-01-23: 150 mg via INTRAMUSCULAR

## 2021-01-23 NOTE — Progress Notes (Signed)
Patient in for depo injection,patient tolerated well.

## 2021-01-24 ENCOUNTER — Telehealth: Payer: Self-pay

## 2021-01-24 ENCOUNTER — Other Ambulatory Visit: Payer: Self-pay | Admitting: Family Medicine

## 2021-01-24 DIAGNOSIS — Z79891 Long term (current) use of opiate analgesic: Secondary | ICD-10-CM

## 2021-01-24 DIAGNOSIS — D571 Sickle-cell disease without crisis: Secondary | ICD-10-CM

## 2021-01-24 DIAGNOSIS — G894 Chronic pain syndrome: Secondary | ICD-10-CM

## 2021-01-24 MED ORDER — XTAMPZA ER 13.5 MG PO C12A: 1.0000 | EXTENDED_RELEASE_CAPSULE | Freq: Two times a day (BID) | ORAL | 0 refills | Status: DC

## 2021-01-24 MED ORDER — OXYCODONE HCL 15 MG PO TABS: 15.0000 mg | ORAL_TABLET | ORAL | 0 refills | Status: DC | PRN

## 2021-01-24 NOTE — Progress Notes (Signed)
Reviewed PDMP substance reporting system prior to prescribing opiate medications. No inconsistencies noted.    Meds ordered this encounter  Medications   oxyCODONE (ROXICODONE) 15 MG immediate release tablet    Sig: Take 1 tablet (15 mg total) by mouth every 4 (four) hours as needed for up to 15 days for pain.    Dispense:  90 tablet    Refill:  0    Please do not refill this medication prior to 01/26/2021. Thank you.    Order Specific Question:   Supervising Provider    Answer:   Tresa Garter [[id number]]   oxyCODONE ER (XTAMPZA ER) 13.5 MG C12A    Sig: Take 1 capsule by mouth every 12 (twelve) hours.    Dispense:  60 capsule    Refill:  0    Please do not fill this medication until 01/31/2021    Order Specific Question:   Supervising Provider    Answer:   Tresa Garter [[id number]]      Sandra Pounds  APRN, MSN, FNP-C [name] [address] [place], [place] [address] [phone]

## 2021-01-24 NOTE — Telephone Encounter (Signed)
Oxycodone 15 mg Xtampza 13.5

## 2021-02-07 ENCOUNTER — Telehealth: Payer: Self-pay

## 2021-02-07 NOTE — Telephone Encounter (Signed)
Oxycodone 15 mg 

## 2021-02-10 ENCOUNTER — Telehealth: Payer: Self-pay

## 2021-02-10 ENCOUNTER — Other Ambulatory Visit: Payer: Self-pay | Admitting: Family Medicine

## 2021-02-10 DIAGNOSIS — D571 Sickle-cell disease without crisis: Secondary | ICD-10-CM

## 2021-02-10 DIAGNOSIS — G894 Chronic pain syndrome: Secondary | ICD-10-CM

## 2021-02-10 DIAGNOSIS — Z79891 Long term (current) use of opiate analgesic: Secondary | ICD-10-CM

## 2021-02-10 MED ORDER — OXYCODONE HCL 15 MG PO TABS: 15.0000 mg | ORAL_TABLET | ORAL | 0 refills | Status: DC | PRN

## 2021-02-10 NOTE — Telephone Encounter (Signed)
Oxycodone  °

## 2021-02-10 NOTE — Progress Notes (Signed)
Reviewed PDMP substance reporting system prior to prescribing opiate medications. No inconsistencies noted.    Meds ordered this encounter  Medications   oxyCODONE (ROXICODONE) 15 MG immediate release tablet    Sig: Take 1 tablet (15 mg total) by mouth every 4 (four) hours as needed for up to 15 days for pain.    Dispense:  90 tablet    Refill:  0    Order Specific Question:   Supervising Provider    Answer:   [name], [name]     Sunil Hue Moore Arbor Cohen  APRN, MSN, FNP-C [facility] [address]  , [place] [address] [phone]  

## 2021-02-11 ENCOUNTER — Ambulatory Visit (INDEPENDENT_AMBULATORY_CARE_PROVIDER_SITE_OTHER): Payer: Self-pay | Admitting: Clinical

## 2021-02-11 DIAGNOSIS — F419 Anxiety disorder, unspecified: Secondary | ICD-10-CM | POA: Diagnosis not present

## 2021-02-11 DIAGNOSIS — F32A Depression, unspecified: Secondary | ICD-10-CM

## 2021-02-11 NOTE — BH Specialist Note (Signed)
Integrated Behavioral Health via Telemedicine Visit  02/11/2021 Sandra Mckay [id number]  Number of [place] visits: 6/20 Session Start time: 9:00  Session End time: 9:55 Total time: 4   Referring Provider: [name], NP Patient/Family location: partner's home, Sandra Mckay [facility] Provider location: Patient [place] All persons participating in visit: [name], patient Types of Service: Individual psychotherapy and Video visit  I connected with Sandra Mckay via Clinical biochemist and verified that I am speaking with the correct person using two identifiers. Discussed confidentiality: Yes   I discussed the limitations of telemedicine and the availability of in person appointments.  Discussed there is a possibility of technology failure and discussed alternative modes of communication if that failure occurs.  I discussed that engaging in this telemedicine visit, they consent to the provision of behavioral healthcare and the services will be billed under their insurance.  Patient and/or legal guardian expressed understanding and consented to Telemedicine visit: Yes   Presenting Concerns: Patient and/or family reports the following symptoms/concerns: Low motivation for ADLs, depression after being let go from job, anxiety Duration of problem: several years; Severity of problem: moderate  Patient and/or Family's Strengths/Protective Factors: Social connections, Social and Emotional competence and Concrete supports in place (healthy food, safe environments, etc.)  Goals Addressed: Patient will:  Increase knowledge and/or ability of: self-management skills   Demonstrate ability to: Increase healthy adjustment to current life circumstances  Progress towards Goals: Ongoing  Interventions: Interventions utilized:  CBT Cognitive Behavioral Therapy and Supportive Counseling Standardized Assessments completed: Not Needed    CBT today to continue reviewing journal entries she had made about triggers she has noticed recently. Processed emotional and behavioral reactions to these triggers. Reflected on patient's behavioral reactions when emotions elevated, and how patient holds boundary to hold a discussion with someone after their emotions aren't as elevated. Supportive counseling as well regarding how patient is coping with physical health lately.   Patient and/or Family Response: Patient engaged in session.   Assessment: Patient currently experiencing depression and anxiety which is exacerbated by interpersonal/family dynamics as well as chronic illness.   Patient may benefit from supportive counseling including CBT to explore unhelpful thoughts about self in context of illness and in relationships. Patient may also benefit from mindfulness and mindful movement for coping with elevated anxiety and emotions.  Plan: Follow up with behavioral health clinician on: 02/28/21 Referral(s): [place] (In Clinic)  I discussed the assessment and treatment plan with the patient and/or parent/guardian. They were provided an opportunity to ask questions and all were answered. They agreed with the plan and demonstrated an understanding of the instructions.   They were advised to call back or seek an in-person evaluation if the symptoms worsen or if the condition fails to improve as anticipated.  [name], [facility] [phone]

## 2021-02-12 ENCOUNTER — Telehealth: Payer: Self-pay

## 2021-02-12 NOTE — Telephone Encounter (Signed)
Prior Approval [id number]  [place] Type:PHARMACY Recipient:Sandra Mckay  Recipient VV:[id number] N   Provider [place] Requesting Provider LJ:[id number] S ubmission Date:02/12/2021 Status:APPROVED  Effective Begin Date:02/12/2021 Effective End Date:08/11/2021  Payer:[place] [employer]EF[employer]

## 2021-02-12 NOTE — Telephone Encounter (Signed)
Prior Auth was imitated thorough [employer] Confirmation #:[id number]

## 2021-02-19 ENCOUNTER — Telehealth: Payer: Self-pay

## 2021-02-19 ENCOUNTER — Other Ambulatory Visit: Payer: Self-pay | Admitting: Family Medicine

## 2021-02-19 DIAGNOSIS — Z79891 Long term (current) use of opiate analgesic: Secondary | ICD-10-CM

## 2021-02-19 DIAGNOSIS — D571 Sickle-cell disease without crisis: Secondary | ICD-10-CM

## 2021-02-19 DIAGNOSIS — G894 Chronic pain syndrome: Secondary | ICD-10-CM

## 2021-02-19 MED ORDER — OXYCODONE HCL 15 MG PO TABS: 15.0000 mg | ORAL_TABLET | ORAL | 0 refills | Status: DC | PRN

## 2021-02-19 NOTE — Progress Notes (Signed)
Reviewed PDMP substance reporting system prior to prescribing opiate medications. No inconsistencies noted.    Meds ordered this encounter  Medications   oxyCODONE (ROXICODONE) 15 MG immediate release tablet    Sig: Take 1 tablet (15 mg total) by mouth every 4 (four) hours as needed for up to 15 days for pain.    Dispense:  90 tablet    Refill:  0    Order Specific Question:   Supervising Provider    Answer:   [name], [name]     Sandra Liles Moore Hydee Fleece  APRN, MSN, FNP-C [facility] [address]  [place], [place] [address] [phone]  

## 2021-02-19 NOTE — Telephone Encounter (Signed)
Oxycodone 15 mg  NEEDS prior auth

## 2021-02-28 ENCOUNTER — Ambulatory Visit: Payer: Medicaid Other | Admitting: Nurse Practitioner

## 2021-02-28 ENCOUNTER — Ambulatory Visit (INDEPENDENT_AMBULATORY_CARE_PROVIDER_SITE_OTHER): Payer: Medicaid Other | Admitting: Clinical

## 2021-02-28 ENCOUNTER — Other Ambulatory Visit: Payer: Self-pay

## 2021-02-28 ENCOUNTER — Encounter: Payer: Self-pay | Admitting: Nurse Practitioner

## 2021-02-28 VITALS — BP 116/79 | HR 58 | Temp 98.8°F | Ht 63.0 in | Wt 119.0 lb

## 2021-02-28 DIAGNOSIS — Z79891 Long term (current) use of opiate analgesic: Secondary | ICD-10-CM

## 2021-02-28 DIAGNOSIS — D571 Sickle-cell disease without crisis: Secondary | ICD-10-CM | POA: Diagnosis not present

## 2021-02-28 DIAGNOSIS — F319 Bipolar disorder, unspecified: Secondary | ICD-10-CM

## 2021-02-28 DIAGNOSIS — M62838 Other muscle spasm: Secondary | ICD-10-CM

## 2021-02-28 DIAGNOSIS — R110 Nausea: Secondary | ICD-10-CM | POA: Diagnosis not present

## 2021-02-28 DIAGNOSIS — F419 Anxiety disorder, unspecified: Secondary | ICD-10-CM

## 2021-02-28 MED ORDER — XTAMPZA ER 13.5 MG PO C12A: 1.0000 | EXTENDED_RELEASE_CAPSULE | Freq: Two times a day (BID) | ORAL | 0 refills | Status: DC

## 2021-02-28 MED ORDER — ONDANSETRON HCL 4 MG PO TABS: 4.0000 mg | ORAL_TABLET | Freq: Three times a day (TID) | ORAL | 5 refills | Status: DC | PRN

## 2021-02-28 MED ORDER — IBUPROFEN 800 MG PO TABS: 800.0000 mg | ORAL_TABLET | Freq: Three times a day (TID) | ORAL | 6 refills | Status: DC | PRN

## 2021-02-28 MED ORDER — QUETIAPINE FUMARATE 25 MG PO TABS: 25.0000 mg | ORAL_TABLET | Freq: Every day | ORAL | 11 refills | Status: DC

## 2021-02-28 MED ORDER — TIZANIDINE HCL 4 MG PO CAPS: 4.0000 mg | ORAL_CAPSULE | Freq: Three times a day (TID) | ORAL | 6 refills | Status: DC | PRN

## 2021-02-28 NOTE — BH Specialist Note (Signed)
Integrated Behavioral Health Follow Up In-Person Visit  MRN: [id number] Name: Sandra Mckay  Number of [place] Clinician visits:  7/20 Session Start time: 1:45  Session End time: 2:35 Total time: 50  minutes  Types of Service: Individual psychotherapy  Interpretor:No. Interpretor Name and Language: none  Subjective: Sandra Mckay is a 25 y.o. female accompanied by  self Patient was referred by [name], NP for depression and anxiety. Patient reports the following symptoms/concerns: Low motivation for ADLs, depression after being let go from job, anxiety Duration of problem: several years; Severity of problem: moderate  Objective: Mood: Euthymic and Affect: Appropriate Risk of harm to self or others: No plan to harm self or others  Patient and/or Family's Strengths/Protective Factors: Social connections, Social and Emotional competence, and Concrete supports in place (healthy food, safe environments, etc.)  Goals Addressed: Patient will:  Increase knowledge and/or ability of: coping skills and self-management skills   Demonstrate ability to: Increase healthy adjustment to current life circumstances  Progress towards Goals: Ongoing  Interventions: Interventions utilized:  CBT Cognitive Behavioral Therapy and Supportive Counseling Standardized Assessments completed:  MDQ (Mood Disorder Questionnaire)  CBT and supportive counseling today regarding patient's interpersonal dynamics with partner and family. Reflection on patient's attachment patterns in relationships. Patient has also been diagnosed with bipolar disorder by a previous mental health provider. Completed MDQ today which points to bipolar disorder as well. She would like to discuss changing her medication and trying something different than Zoloft, which she is currently taking; referred patient to PCP for this and patient has appointment with PCP after this session.   Patient and/or  Family Response: Patient engaged in session.  Assessment: Patient currently experiencing depression and anxiety which is exacerbated by interpersonal/family dynamics as well as chronic illness.   Patient may benefit from supportive counseling including CBT to explore unhelpful thoughts about self in context of illness and in relationships. Patient may also benefit from mindfulness and mindful movement for coping with elevated anxiety and emotions.  Plan: Follow up with behavioral health clinician on : 03/11/21 Referral(s): [place] (In Clinic)  [name], [facility] [phone]

## 2021-02-28 NOTE — Progress Notes (Signed)
[place], [place]  [id number] Phone:  [phone]   Fax:  [phone]   Established Patient Office Visit  Subjective:  Patient ID: Sandra Mckay, female    DOB: 03/05/1996  Age: 25 y.o. MRN: [id number]  CC:  Chief Complaint  Patient presents with   Follow-up    HPI Sandra Mckay presents for follow up. A former patient of NP Stroud. 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   She has 6/10 hip and leg pain these are the primary areas of pain. She denies any recent imaging. Her left knee pain has increased. She reports previous leg pain was mainly in her shins. She reports having more pain crisis. She is concern about the Xtampza 13.5 mg dose not being as effective. She is applying for disability. She is unable to maintaining a job due to recurrent pain crisis. She is interested in "Adakevo injection". She feels like if she can get hydration every 2 weeks this may help with her pain. She would like to remain out of the hospital . She has a 21 year old daughter.  She has been on sertraline 50 mg for depressions. She does not feel like this is effective. She has not taken this over the last 3 or more days.  She would like something for Bipolar and mood swing. She feels like this triggers the depression.  She is having mood swing numerous times per day. She reports that this is exhausting.    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   TONSILLECTOMY  2012    Family History  Problem Relation Age of Onset   Arthritis Other        grandparent   Stroke Other    Hypertension Other    Diabetes Other        grandparent   Cancer - Other Other        Glioblastoma    Social History   Socioeconomic History   Marital status: Single    Spouse name: Not on file   Number of children: Not on file   Years of education: Not on file   Highest education level: Not on file  Occupational History   Not on file  Tobacco Use   Smoking status: Former   Smokeless tobacco: Never  Vaping Use   Vaping Use: Never used  Substance and Sexual Activity   Alcohol use: Yes    Alcohol/week: 0.0 standard drinks    Comment: occ   Drug use: No   Sexual activity: Yes  Other Topics Concern  Not on file  Social History Narrative   Lives with mom in a one story home.  Education: 2 years of college.    Social Determinants of Health   Financial Resource Strain: Not on file  Food Insecurity: Not on file  Transportation Needs: Not on file  Physical Activity: Not on file  Stress: Not on file  Social Connections: Not on file  Intimate Partner Violence: Not on file    Outpatient Medications Prior to Visit  Medication Sig Dispense Refill   folic acid (FOLVITE) 1 MG tablet Take 1 tablet (1 mg total) by mouth daily. 90 tablet 3   hydroxyurea (HYDREA) 500 MG capsule TAKE 3 CAPSULES (1,500 MG TOTAL) BY MOUTH DAILY. 90 capsule 6   megestrol (MEGACE) 40 MG tablet Take 1 tablet (40 mg total) by mouth daily. 30 tablet 11   oxyCODONE (ROXICODONE) 15 MG immediate release tablet Take 1 tablet (15 mg total) by mouth every 4 (four) hours as  needed for up to 15 days for pain. 90 tablet 0   sertraline (ZOLOFT) 50 MG tablet TAKE 1 TABLET BY MOUTH EVERY DAY 90 tablet 4   ibuprofen (ADVIL) 800 MG tablet Take 1 tablet (800 mg total) by mouth every 8 (eight) hours as needed. 60 tablet 6   ondansetron (ZOFRAN) 4 MG tablet Take 1 tablet (4 mg total) by mouth every 8 (eight) hours as needed for nausea or vomiting. 20 tablet 5   oxyCODONE ER (XTAMPZA ER) 13.5 MG C12A Take 1 capsule by mouth every 12 (twelve) hours. 60 capsule 0   tiZANidine (ZANAFLEX) 4 MG capsule Take 1 capsule (4 mg total) by mouth 3 (three) times daily as needed for muscle spasms. 90 capsule 6   Vitamin D, Ergocalciferol, (DRISDOL) 1.25 MG (50000 UNIT) CAPS capsule Take 1 capsule (50,000 Units total) by mouth every Saturday. Saturday. 5 capsule 5   No facility-administered medications prior to visit.    Allergies  Allergen Reactions   Latex Rash    ROS Review of Systems    Objective:    Physical Exam Constitutional:      Appearance: She is normal weight.  HENT:     Head: Normocephalic and atraumatic.  Cardiovascular:     Rate and Rhythm: Normal rate and regular rhythm.     Pulses: Normal pulses.     Heart sounds: Normal heart sounds.  Pulmonary:     Effort: Pulmonary effort is normal.     Breath sounds: Normal breath sounds.  Abdominal:     Palpations: Abdomen is soft.  Musculoskeletal:        General: Normal range of motion.     Right lower leg: No edema.     Left lower leg: No edema.  Skin:    General: Skin is warm and dry.     Capillary Refill: Capillary refill takes less than 2 seconds.  Neurological:     General: No focal deficit present.     Mental Status: She is alert and oriented to person, place, and time.  Psychiatric:        Mood and Affect: Mood normal.        Behavior: Behavior normal.        Thought Content: Thought content normal.        Judgment: Judgment normal.    BP 116/79 (BP Location: Right Arm, Patient Position:  Sitting)   Pulse (!) 58   Temp 98.8 F (37.1 C)   Ht '5\' 3"'$  (1.6 m)  Wt 119 lb 0.8 oz (54 kg)   LMP 02/20/2021   SpO2 100%   Breastfeeding No   BMI 21.09 kg/m  Wt Readings from Last 3 Encounters:  02/28/21 119 lb 0.8 oz (54 kg)  10/25/20 117 lb (53.1 kg)  09/27/20 117 lb 6.4 oz (53.3 kg)     Health Maintenance Due  Topic Date Due   COVID-19 Vaccine (1) Never done   Hepatitis C Screening  Never done   Pneumococcal Vaccine 63-57 Years old (3 - PCV) 08/29/2013   PAP-Cervical Cytology Screening  Never done   PAP SMEAR-Modifier  09/01/2017    There are no preventive care reminders to display for this patient.  No results found for: TSH Lab Results  Component Value Date   WBC 7.4 10/15/2020   HGB 7.6 (L) 10/15/2020   HCT 21.7 (L) 10/15/2020   MCV 108.0 (H) 10/15/2020   PLT 434 (H) 10/15/2020   Lab Results  Component Value Date   NA 140 10/15/2020   K 3.8 10/15/2020   CO2 23 10/15/2020   GLUCOSE 88 10/15/2020   BUN 7 10/15/2020   CREATININE 0.47 10/15/2020   BILITOT 2.3 (H) 10/15/2020   ALKPHOS 66 10/15/2020   AST 23 10/15/2020   ALT 16 10/15/2020   PROT 6.9 10/15/2020   ALBUMIN 4.6 10/15/2020   CALCIUM 9.2 10/15/2020   ANIONGAP 9 10/15/2020   No results found for: CHOL No results found for: HDL No results found for: LDLCALC No results found for: TRIG No results found for: CHOLHDL No results found for: HGBA1C    Assessment & Plan:   Problem List Items Addressed This Visit       Other   Nausea without vomiting Stable   Relevant Medications   ondansetron (ZOFRAN) 4 MG tablet   Bipolar and related disorder (HCC) - Primary Worsening  Trial Quetiapine 25 mg with titration as needed 2 week follow up  Discontinue Sertraline   Other Visit Diagnoses     Hb-SS disease without crisis ([place])     Ensure adequate hydration. Move frequently to reduce venous thromboembolism risk. Avoid situations that could lead to dehydration or could exacerbate  pain Discussed S&S of infection, seizures, stroke acute chest, DVT and how important it is to seek medical attention Take medication as directed along with pain contract and overall compliance Discussed the risk related to opiate use (addition, tolerance and dependency) Will start paperwork for the Crizanlizumab/Adekveo    Relevant Medications   ibuprofen (ADVIL) 800 MG tablet   oxyCODONE ER (XTAMPZA ER) 13.5 MG C12A   tiZANidine (ZANAFLEX) 4 MG capsule   Chronic prescription opiate use       Relevant Medications   oxyCODONE ER (XTAMPZA ER) 13.5 MG C12A   Muscle spasms of both lower extremities       Relevant Medications   tiZANidine (ZANAFLEX) 4 MG capsule       Meds ordered this encounter  Medications   ibuprofen (ADVIL) 800 MG tablet    Sig: Take 1 tablet (800 mg total) by mouth every 8 (eight) hours as needed.    Dispense:  60 tablet    Refill:  6   ondansetron (ZOFRAN) 4 MG tablet    Sig: Take 1 tablet (4 mg total) by mouth every 8 (eight) hours as needed for nausea or vomiting.    Dispense:  20 tablet    Refill:  5   oxyCODONE ER (XTAMPZA ER) 13.5 MG C12A    Sig: Take  1 capsule by mouth every 12 (twelve) hours.    Dispense:  60 capsule    Refill:  0   tiZANidine (ZANAFLEX) 4 MG capsule    Sig: Take 1 capsule (4 mg total) by mouth 3 (three) times daily as needed for muscle spasms.    Dispense:  90 capsule    Refill:  6   QUEtiapine (SEROQUEL) 25 MG tablet    Sig: Take 1 tablet (25 mg total) by mouth at bedtime.    Dispense:  30 tablet    Refill:  11    Order Specific Question:   Supervising Provider    Answer:   Tresa Garter [id number]    Follow-up: Return in about 2 weeks (around 03/14/2021) for follow up medication management virtual visit.    Sandra Francois, NP

## 2021-02-28 NOTE — Patient Instructions (Addendum)
Sickle Cell Anemia, Adult  Sickle cell anemia is a condition where your red blood cells are shaped like sickles. Red blood cells carry oxygen through the body. Sickle-shaped cells do not live as long as normal red blood cells. They also clump together and block blood from flowing through the blood vessels. This prevents the body from getting enough oxygen. Sickle cell anemia causes organ damage and pain. It alsoincreases the risk of infection. Follow these instructions at home: Medicines Take over-the-counter and prescription medicines only as told by your doctor. If you were prescribed an antibiotic medicine, take it as told by your doctor. Do not stop taking the antibiotic even if you start to feel better. If you develop a fever, do not take medicines to lower the fever right away. Tell your doctor about the fever. Managing pain, stiffness, and swelling Try these methods to help with pain: Use a heating pad. Take a warm bath. Distract yourself, such as by watching TV. Eating and drinking Drink enough fluid to keep your pee (urine) clear or pale yellow. Drink more in hot weather and during exercise. Limit or avoid alcohol. Eat a healthy diet. Eat plenty of fruits, vegetables, whole grains, and lean protein. Take vitamins and supplements as told by your doctor. Traveling When traveling, keep these with you: Your medical information. The names of your doctors. Your medicines. If you need to take an airplane, talk to your doctor first. Activity Rest often. Avoid exercises that make your heart beat much faster, such as jogging. General instructions Do not use products that have nicotine or tobacco, such as cigarettes and e-cigarettes. If you need help quitting, ask your doctor. Consider wearing a medical alert bracelet. Avoid being in high places (high altitudes), such as mountains. Avoid very hot or cold temperatures. Avoid places where the temperature changes a lot. Keep all follow-up  visits as told by your doctor. This is important. Contact a doctor if: A joint hurts. Your feet or hands hurt or swell. You feel tired (fatigued). Get help right away if: You have symptoms of infection. These include: Fever. Chills. Being very tired. Irritability. Poor eating. Throwing up (vomiting). You feel dizzy or faint. You have new stomach pain, especially on the left side. You have a an erection (priapism) that lasts more than 4 hours. You have numbness in your arms or legs. You have a hard time moving your arms or legs. You have trouble talking. You have pain that does not go away when you take medicine. You are short of breath. You are breathing fast. You have a long-term cough. You have pain in your chest. You have a bad headache. You have a stiff neck. Your stomach looks bloated even though you did not eat much. Your skin is pale. You suddenly cannot see well. Summary Sickle cell anemia is a condition where your red blood cells are shaped like sickles. Follow your doctor's advice on ways to manage pain, food to eat, activities to do, and steps to take for safe travel. Get medical help right away if you have any signs of infection, such as a fever. This information is not intended to replace advice given to you by your health care provider. Make sure you discuss any questions you have with your healthcare provider. Document Revised: 12/14/2019 Document Reviewed: 12/14/2019 Elsevier Patient Education  [place] Bipolar Disorder When someone is diagnosed with bipolar disorder, the person may be relieved to now know why he or she has felt or  behaved a certain way. The person may also feel overwhelmed about the treatment ahead, how to get needed support, and how to deal with the condition each day. With care and support, a person with bipolar disorder can learn to manage his or her symptoms and live with thecondition. How to manage lifestyle  changes Managing stress Stress is your body's reaction to life changes and events, both good and bad. Stress can play a major role in bipolar disorder, so it is important to learn how to manage stress. Some techniques to help you manage stress include: Meditation, muscle relaxation, and breathing exercises. Exercise. Even a short daily walk can help to lower stress levels. Getting enough good-quality sleep. Too little sleep can cause mania to start (can trigger mania). Making a schedule to manage your time. Knowing your daily schedule can help to keep you from feeling overwhelmed by tasks and deadlines. Spending time on hobbies you enjoy.  Medicines Your health care provider may suggest certain medicines if he or she feels that they will help improve your condition. Avoid using caffeine, alcohol, and other substances that may prevent your medicines from working properly. It is also important to: Talk with your pharmacist or health care provider about all the medicines that you take, their possible side effects, and which medicines are safe to take together. Make it your goal to take part in all treatment decisions (shared decision-making). Ask about possible side effects of medicines that your health care provider recommends, and tell him or her how you feel about having those side effects. It is best if shared decision-making with your health care provider is part of your total treatment plan. If you are taking medicines as part of your treatment, do not stop taking medicines before you ask your health care provider if it is safe to stop. You may need to have the medicine slowly decreased (tapered) over time to lower the risk of harmful side effects. Relationships Spend time with people whom you trust and with whom you feel a sense of understanding and calm. Try to find friends or family members who make you feel safe and can help you control feelings of mania. Consider going to couples counseling,  family education classes, or family therapy to: Educate your loved ones about your condition and offer suggestions about how they can support you. Help resolve conflicts. Help develop communication skills in your relationships.  How to recognize changes in your condition Everyone responds differently to treatment for bipolar disorder. Some signs that your condition is improving include: Leveling of your mood. You may have less anger and excitement about daily activities, and your low moods may not be as bad. Your symptoms being less intense. Feeling calm more often. Thinking clearly. Not experiencing consequences for extreme behavior. Feeling like your life is settling down. Your behavior seeming more normal to you and to other people. Some signs that your condition may be getting worse include: Sleep problems. Moods cycling between deep lows and unusually high (excess) energy. Extreme emotions. More anger at loved ones. Staying away from others, or isolating yourself. A feeling of power or superiority. Completing a lot of tasks in a very short amount of time. Unusual thoughts and behaviors. Suicidal thoughts. Follow these instructions at home: Medicines Take over-the-counter and prescription medicines only as told by your health care provider or pharmacist. Ask your pharmacist what over-the-counter cold medicines you should avoid. Some medicines can make symptoms worse. General instructions Ask for support from trusted family members  or friends to make sure you stay on track with your treatment. Keep a journal to write down your daily moods, medicines, sleep habits, and life events. This may help you have more success with your treatment. Make and follow a routine for daily meal times. Eat healthy foods, such as whole grains, vegetables, and fresh fruit. Try to go to sleep and wake up around the same time every day. Keep all follow-up visits as told by your health care provider.  This is important. Where to find support Talking to others Try making a list of the people you may want to tell about your condition, such as the people you trust most. Plan what you are willing and not willing to talk about. Think about your needs ahead of time, and how your friends and family members can support you. Let your loved ones know when they can share advice and when you would just like them to listen. Give your loved ones information about bipolar disorder, and encourage them to learn about the condition. Finances Not all insurance plans cover mental health care, so it is important to check with your insurance carrier. If paying for co-pays or counseling services is a problem, search for a local or county mental health care center. Public mental health care services may be offered there at a low cost or no cost when you arenot able to see a private health care provider. If you are taking medicine for depression, you may be able to get the generic form, which may be less expensive than brand-name medicine. Some makers of prescription medicines also offer help to patients who cannot afford themedicines they need. Questions to ask your health care provider: If you are taking medicines: How long do I need to take medicine? Are there any long-term side effects of my medicine? Are there any alternatives to taking medicine? How would I benefit from therapy? How often should I follow up with a health care provider? Contact a health care provider if: Your symptoms get worse or they do not get better with treatment. Get help right away if: You have thoughts about harming yourself or others. If you ever feel like you may hurt yourself or others, or have thoughts about taking your own life, get help right away. You can go to your nearest emergency department or call: Your local emergency services (911 in the [place] A suicide crisis helpline, such as the [facility] at  435-737-4341. This is open 24-hours a day. Summary Learning ways to manage stress can help to calm you and may also help your treatment work better. There is a wide range of medicines that can help to treat bipolar disorder. Having healthy relationships can help to make your moods more stable. Contact a health care provider if your symptoms get worse or they do not get better with treatment. This information is not intended to replace advice given to you by your health care provider. Make sure you discuss any questions you have with your healthcare provider. Document Revised: 01/03/2020 Document Reviewed: 01/03/2020 Elsevier Patient Education  2022 [employer].

## 2021-03-04 ENCOUNTER — Telehealth: Payer: Self-pay

## 2021-03-04 NOTE — Telephone Encounter (Signed)
Oxycodone 15 mg 

## 2021-03-05 ENCOUNTER — Other Ambulatory Visit: Payer: Self-pay | Admitting: Nurse Practitioner

## 2021-03-05 DIAGNOSIS — M62838 Other muscle spasm: Secondary | ICD-10-CM

## 2021-03-05 DIAGNOSIS — D571 Sickle-cell disease without crisis: Secondary | ICD-10-CM

## 2021-03-05 MED ORDER — TIZANIDINE HCL 4 MG PO TABS: 4.0000 mg | ORAL_TABLET | Freq: Three times a day (TID) | ORAL | 1 refills | Status: DC

## 2021-03-07 ENCOUNTER — Other Ambulatory Visit: Payer: Self-pay | Admitting: Nurse Practitioner

## 2021-03-07 ENCOUNTER — Telehealth: Payer: Self-pay | Admitting: Internal Medicine

## 2021-03-07 DIAGNOSIS — Z79891 Long term (current) use of opiate analgesic: Secondary | ICD-10-CM

## 2021-03-07 DIAGNOSIS — D571 Sickle-cell disease without crisis: Secondary | ICD-10-CM

## 2021-03-07 MED ORDER — XTAMPZA ER 13.5 MG PO C12A: 1.0000 | EXTENDED_RELEASE_CAPSULE | Freq: Two times a day (BID) | ORAL | 0 refills | Status: DC
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
## 2021-03-10 ENCOUNTER — Telehealth: Payer: Self-pay | Admitting: Internal Medicine

## 2021-03-10 ENCOUNTER — Other Ambulatory Visit: Payer: Self-pay | Admitting: Internal Medicine

## 2021-03-10 DIAGNOSIS — D571 Sickle-cell disease without crisis: Secondary | ICD-10-CM

## 2021-03-10 DIAGNOSIS — Z79891 Long term (current) use of opiate analgesic: Secondary | ICD-10-CM

## 2021-03-10 DIAGNOSIS — G894 Chronic pain syndrome: Secondary | ICD-10-CM

## 2021-03-10 MED ORDER — OXYCODONE HCL 15 MG PO TABS: 15.0000 mg | ORAL_TABLET | ORAL | 0 refills | Status: DC | PRN

## 2021-03-10 NOTE — Telephone Encounter (Signed)
Pt calling for update on Oxycodone Medication pt stated  "15 mg". Pt was notified Provider sent it Friday evening. Pt called Pharmacy and called [facility] to notify she was told that it wasn't sent/ they've not received it.  Pt was informed Front desk would check w/ Nurse for clarification and notify pt asap. Pt agreed and stated she doesn't have any left and this is urgent.   Please advise and thank you

## 2021-03-11 ENCOUNTER — Ambulatory Visit (INDEPENDENT_AMBULATORY_CARE_PROVIDER_SITE_OTHER): Payer: Medicaid Other | Admitting: Clinical

## 2021-03-11 ENCOUNTER — Other Ambulatory Visit: Payer: Self-pay | Admitting: Internal Medicine

## 2021-03-11 ENCOUNTER — Other Ambulatory Visit: Payer: Self-pay

## 2021-03-11 DIAGNOSIS — D571 Sickle-cell disease without crisis: Secondary | ICD-10-CM

## 2021-03-11 DIAGNOSIS — F419 Anxiety disorder, unspecified: Secondary | ICD-10-CM

## 2021-03-11 DIAGNOSIS — F319 Bipolar disorder, unspecified: Secondary | ICD-10-CM | POA: Diagnosis not present

## 2021-03-11 DIAGNOSIS — Z79891 Long term (current) use of opiate analgesic: Secondary | ICD-10-CM

## 2021-03-11 DIAGNOSIS — G894 Chronic pain syndrome: Secondary | ICD-10-CM

## 2021-03-11 MED ORDER — OXYCODONE HCL ER 10 MG PO T12A: 10.0000 mg | EXTENDED_RELEASE_TABLET | Freq: Two times a day (BID) | ORAL | 0 refills | Status: DC
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
## 2021-03-11 NOTE — Progress Notes (Signed)
Patient's insurance did not approve prior authorization for [facility], prescription changed to OxyContin 10 mg twice daily.  30-day prescription sent to patient's pharmacy.  Angelica Chessman, MD  03/11/21 2:37 PM

## 2021-03-12 ENCOUNTER — Telehealth: Payer: Self-pay

## 2021-03-12 NOTE — Telephone Encounter (Signed)
Prior authorization for OxyContin initiated via NCTracks Confirmation Number: [id number]

## 2021-03-12 NOTE — Telephone Encounter (Signed)
Prior Approval [id number] [place] Recipient [id number] Requesting Provider Name:OLUGBEMIGA Cornelious Bryant  Requesting Provider [id number] Submission Date:03/12/2021  Status:APPROVED  Effective Begin Date:03/12/2021 Effective End Date:03/07/2022

## 2021-03-13 ENCOUNTER — Telehealth: Payer: Self-pay

## 2021-03-13 NOTE — Telephone Encounter (Signed)
Prior Auth for Oxycodone initiated via NCTracks Confirmation [id number]

## 2021-03-14 NOTE — Telephone Encounter (Signed)
Prior Approval [id number] [place] Type:PHARMACYRecipient:Lyndsi I WILLIAMSRecipient [id number] NBilling Provider:Billing Provider [id number] Provider Name:OLUGBEMIGA Gevena Cotton Provider [id number]   Submission Date:03/13/2021 Status:APPROVED Effective Begin Date:08/11/2022Effective End Date:09/09/2021

## 2021-03-21 ENCOUNTER — Telehealth: Payer: Self-pay

## 2021-03-21 ENCOUNTER — Other Ambulatory Visit: Payer: Self-pay | Admitting: Nurse Practitioner

## 2021-03-21 DIAGNOSIS — D571 Sickle-cell disease without crisis: Secondary | ICD-10-CM

## 2021-03-21 DIAGNOSIS — G894 Chronic pain syndrome: Secondary | ICD-10-CM

## 2021-03-21 DIAGNOSIS — Z79891 Long term (current) use of opiate analgesic: Secondary | ICD-10-CM

## 2021-03-21 MED ORDER — OXYCODONE HCL 15 MG PO TABS: 15.0000 mg | ORAL_TABLET | ORAL | 0 refills | Status: DC | PRN

## 2021-03-21 NOTE — Telephone Encounter (Signed)
Oxycodone 15 mg 

## 2021-03-25 ENCOUNTER — Ambulatory Visit: Payer: Self-pay | Admitting: Clinical

## 2021-03-25 DIAGNOSIS — F319 Bipolar disorder, unspecified: Secondary | ICD-10-CM

## 2021-03-25 DIAGNOSIS — F419 Anxiety disorder, unspecified: Secondary | ICD-10-CM

## 2021-03-25 NOTE — BH Specialist Note (Addendum)
Integrated Behavioral Health General Follow Up Note  03/25/2021 Name: Suzane Chalfin MRN: [id number] DOB: [birth year] [place] Gearheart is a 25 y.o. year old female who sees Vevelyn Francois, NP for primary care. LCSW was initially consulted to assist the patient with Mental Health Counseling and Resources.  Interpreter: No.   Interpreter Name & Language: none  Assessment: Patient experiencing bipolar disorder and anxiety.  Ongoing Intervention: Today [name] and patient spoke briefly on MyChart video visit for scheduled [place] (IBH) appointment. Patient had to cut the visit short at 15 minutes due to an issue that came up during the visit. [name] and patient planned to follow up by phone to schedule a new appointment.  Review of patient status, including review of consultants reports, relevant laboratory and other test results, and collaboration with appropriate care team members and the patient's provider was performed as part of comprehensive patient evaluation and provision of services.    [name], [facility] [phone]

## 2021-03-28 ENCOUNTER — Other Ambulatory Visit: Payer: Self-pay | Admitting: Nurse Practitioner

## 2021-03-28 DIAGNOSIS — F319 Bipolar disorder, unspecified: Secondary | ICD-10-CM

## 2021-03-28 MED ORDER — QUETIAPINE FUMARATE 25 MG PO TABS: 25.0000 mg | ORAL_TABLET | Freq: Every day | ORAL | 3 refills | Status: DC

## 2021-04-02 ENCOUNTER — Ambulatory Visit (INDEPENDENT_AMBULATORY_CARE_PROVIDER_SITE_OTHER): Payer: Medicaid Other | Admitting: Clinical

## 2021-04-02 ENCOUNTER — Other Ambulatory Visit: Payer: Self-pay

## 2021-04-02 DIAGNOSIS — F319 Bipolar disorder, unspecified: Secondary | ICD-10-CM | POA: Diagnosis not present

## 2021-04-02 DIAGNOSIS — F419 Anxiety disorder, unspecified: Secondary | ICD-10-CM

## 2021-04-02 NOTE — BH Specialist Note (Addendum)
Integrated Behavioral Health via Telemedicine Visit  04/02/2021 Hubert Pietropaolo [id number]  Number of [place] visits: 9/20 Session Start time: 11:20  Session End time: 12:15 Total time: 66   Referring Provider: [name], NP Patient/Family location: [place], [facility] Provider location: Patient [place] All persons participating in visit: [name], patient Types of Service: Individual psychotherapy and Telephone visit  I connected with Sandra Mckay via Telephone and verified that I am speaking with the correct person using two identifiers. Discussed confidentiality: Yes   I discussed the limitations of telemedicine and the availability of in person appointments.  Discussed there is a possibility of technology failure and discussed alternative modes of communication if that failure occurs.  I discussed that engaging in this telemedicine visit, they consent to the provision of behavioral healthcare and the services will be billed under their insurance.  Patient and/or legal guardian expressed understanding and consented to Telemedicine visit: Yes   Presenting Concerns: Patient and/or family reports the following symptoms/concerns: Low motivation for ADLs, depression after being let go from job, anxiety Duration of problem: several years; Severity of problem: moderate  Patient and/or Family's Strengths/Protective Factors: Social connections, Social and Emotional competence, and Concrete supports in place (healthy food, safe environments, etc.)  Goals Addressed: Patient will:  Reduce symptoms of: anxiety and depression   Increase knowledge and/or ability of: coping skills and self-management skills   Demonstrate ability to: Increase healthy adjustment to current life circumstances  Progress towards Goals: Ongoing  Interventions: Interventions utilized:  CBT Cognitive Behavioral Therapy and Supportive Counseling Standardized  Assessments completed: Not Needed  CBT today to explore behavior patterns in context of attachment style. Patient exhibits insight into behaviors in relationships. Supportive counseling with reflective listening regarding this in the context of patient's relationship with her daughter's father. Exploration of vicious cycle of depression and low motivation to engage in valued behaviors.   Patient and/or Family Response: Patient engaged in session.  Assessment: Patient currently experiencing depression and anxiety which is exacerbated by interpersonal/family dynamics as well as chronic illness.   Patient may benefit from supportive counseling including CBT to explore unhelpful thoughts about self in context of illness and in relationships. Patient may also benefit from mindfulness for coping with elevated anxiety and emotions.  Plan: Follow up with behavioral health clinician on: 04/10/21 Referral(s): [place] (In Clinic)  I discussed the assessment and treatment plan with the patient and/or parent/guardian. They were provided an opportunity to ask questions and all were answered. They agreed with the plan and demonstrated an understanding of the instructions.   They were advised to call back or seek an in-person evaluation if the symptoms worsen or if the condition fails to improve as anticipated.  [name], [facility] [phone]

## 2021-04-08 ENCOUNTER — Telehealth: Payer: Self-pay

## 2021-04-08 NOTE — Telephone Encounter (Signed)
Oxycodone 15 mg 

## 2021-04-09 ENCOUNTER — Telehealth: Payer: Self-pay

## 2021-04-09 ENCOUNTER — Telehealth: Payer: Self-pay | Admitting: Clinical

## 2021-04-09 ENCOUNTER — Other Ambulatory Visit: Payer: Self-pay | Admitting: Nurse Practitioner

## 2021-04-09 DIAGNOSIS — D571 Sickle-cell disease without crisis: Secondary | ICD-10-CM

## 2021-04-09 DIAGNOSIS — Z79891 Long term (current) use of opiate analgesic: Secondary | ICD-10-CM

## 2021-04-09 DIAGNOSIS — G894 Chronic pain syndrome: Secondary | ICD-10-CM

## 2021-04-09 MED ORDER — OXYCODONE HCL ER 10 MG PO T12A: 10.0000 mg | EXTENDED_RELEASE_TABLET | Freq: Two times a day (BID) | ORAL | 0 refills | Status: DC

## 2021-04-09 MED ORDER — OXYCODONE HCL 15 MG PO TABS: 15.0000 mg | ORAL_TABLET | ORAL | 0 refills | Status: DC | PRN

## 2021-04-09 NOTE — Telephone Encounter (Signed)
Integrated Behavioral Health General Follow Up Note   04/09/2021 Name: Sandra Mckay        MRN: [id number]       DOB: Apr 27, 1996 Sandra Mckay is a 25 y.o. year old female who sees Vevelyn Francois, NP for primary care. LCSW was initially consulted to assist the patient with Mental Health Counseling and Resources.   Interpreter: No.   Interpreter Name & Language: none   Assessment: Patient experiencing bipolar disorder and anxiety.   Ongoing Intervention: Today patient sent [name] a message requesting a call back. [name] called patient and patient reported a problem getting her medication refilled. Noted that the front desk has routed this request to patient's provider. [name] also notified provider of [name] call with patient. This may be due to patient not calling in advance of medication refill date; advised patient to call on Fridays for a refill due early in the week.    Review of patient status, including review of consultants reports, relevant laboratory and other test results, and collaboration with appropriate care team members and the patient's provider was performed as part of comprehensive patient evaluation and provision of services.     [name], [facility] [phone]

## 2021-04-09 NOTE — Telephone Encounter (Signed)
[facility] attended [facility] sickle cell support group via virtual format.   Roisin Mones, LCSW [facility] [phone] 

## 2021-04-09 NOTE — Telephone Encounter (Signed)
Oxycodone 15 mg 

## 2021-04-10 ENCOUNTER — Other Ambulatory Visit: Payer: Self-pay

## 2021-04-10 ENCOUNTER — Ambulatory Visit (INDEPENDENT_AMBULATORY_CARE_PROVIDER_SITE_OTHER): Payer: Medicaid Other | Admitting: Clinical

## 2021-04-10 ENCOUNTER — Ambulatory Visit (INDEPENDENT_AMBULATORY_CARE_PROVIDER_SITE_OTHER): Payer: Medicaid Other | Admitting: Nurse Practitioner

## 2021-04-10 DIAGNOSIS — F319 Bipolar disorder, unspecified: Secondary | ICD-10-CM

## 2021-04-10 DIAGNOSIS — F419 Anxiety disorder, unspecified: Secondary | ICD-10-CM

## 2021-04-10 DIAGNOSIS — Z3042 Encounter for surveillance of injectable contraceptive: Secondary | ICD-10-CM

## 2021-04-10 LAB — POCT URINE PREGNANCY: Preg Test, Ur: NEGATIVE

## 2021-04-10 MED ORDER — MEDROXYPROGESTERONE ACETATE 150 MG/ML IM SUSP
150.0000 mg | Freq: Once | INTRAMUSCULAR | Status: AC
  Administered 2021-04-10: 150 mg via INTRAMUSCULAR

## 2021-04-10 NOTE — Progress Notes (Signed)
Patient in for depo injection. Patient tolerated well.

## 2021-04-10 NOTE — BH Specialist Note (Addendum)
Integrated Behavioral Health via Telemedicine Visit  04/10/2021 Piera Balam [id number]  Number of [place] visits: 10/20 Session Start time: 2:20  Session End time: 2:55 Total time: 35   Referring Provider: [name], NP Patient/Family location: [place], [facility] Provider location: Patient [place] All persons participating in visit: [name], patient Types of Service: Individual psychotherapy and Telephone visit  I connected with Sandra Mckay via Telephone and verified that I am speaking with the correct person using two identifiers. Discussed confidentiality: Yes   I discussed the limitations of telemedicine and the availability of in person appointments.  Discussed there is a possibility of technology failure and discussed alternative modes of communication if that failure occurs.  I discussed that engaging in this telemedicine visit, they consent to the provision of behavioral healthcare and the services will be billed under their insurance.  Patient and/or legal guardian expressed understanding and consented to Telemedicine visit: Yes   Presenting Concerns: Patient and/or family reports the following symptoms/concerns: Low motivation for ADLs, depression after being let go from job, anxiety Duration of problem: several years; Severity of problem: moderate  Patient and/or Family's Strengths/Protective Factors: Social connections, Social and Emotional competence, and Concrete supports in place (healthy food, safe environments, etc.)  Goals Addressed: Patient will:  Reduce symptoms of: anxiety and depression   Demonstrate ability to: Increase healthy adjustment to current life circumstances  Progress towards Goals: Ongoing  Interventions: Interventions utilized:  Supportive Counseling Standardized Assessments completed: Not Needed  Supportive counseling today regarding interpersonal family dynamics. Supportive  reflection and validation of emotions. Patient inquired about child care resources; [name] to follow up on this as patient lives in [facility] [place].   Patient and/or Family Response: Patient engaged in session.  Assessment: Patient currently experiencing depression and anxiety which is exacerbated by interpersonal/family dynamics as well as chronic illness.   Patient may benefit from supportive counseling including CBT to explore unhelpful thoughts about self in context of illness and in relationships. Patient may also benefit from mindfulness for coping with elevated anxiety and emotions.  Plan: Follow up with behavioral health clinician on: 04/18/21 Referral(s): [place] (In Clinic)  I discussed the assessment and treatment plan with the patient and/or parent/guardian. They were provided an opportunity to ask questions and all were answered. They agreed with the plan and demonstrated an understanding of the instructions.   They were advised to call back or seek an in-person evaluation if the symptoms worsen or if the condition fails to improve as anticipated.  [name], [facility] [phone]

## 2021-04-11 ENCOUNTER — Other Ambulatory Visit: Payer: Self-pay | Admitting: Nurse Practitioner

## 2021-04-17 ENCOUNTER — Telehealth: Payer: Self-pay

## 2021-04-17 NOTE — Telephone Encounter (Signed)
Prior auth initiated for OxyContin via nctracks.  Confirmation [id number]

## 2021-04-18 ENCOUNTER — Ambulatory Visit (INDEPENDENT_AMBULATORY_CARE_PROVIDER_SITE_OTHER): Payer: Medicaid Other | Admitting: Clinical

## 2021-04-18 DIAGNOSIS — F419 Anxiety disorder, unspecified: Secondary | ICD-10-CM

## 2021-04-18 DIAGNOSIS — F319 Bipolar disorder, unspecified: Secondary | ICD-10-CM | POA: Diagnosis not present

## 2021-04-18 NOTE — BH Specialist Note (Addendum)
Integrated Behavioral Health via Telemedicine Visit  04/18/2021 Terrina Kun [id number]  Number of [place] visits: 11/20 Session Start time: 11:00  Session End time: 11:50 Total time: 50   Referring Provider: [name], NP Patient/Family location: [place], [facility] Provider location: Patient [place] All persons participating in visit: [name], patient Types of Service: Individual psychotherapy and Telephone visit  I connected with [name] via Telephone and verified that I am speaking with the correct person using two identifiers. Discussed confidentiality: Yes   I discussed the limitations of telemedicine and the availability of in person appointments.  Discussed there is a possibility of technology failure and discussed alternative modes of communication if that failure occurs.  I discussed that engaging in this telemedicine visit, they consent to the provision of behavioral healthcare and the services will be billed under their insurance.  Patient and/or legal guardian expressed understanding and consented to Telemedicine visit: Yes   Presenting Concerns: Patient and/or family reports the following symptoms/concerns: Low motivation for ADLs, depression after being let go from job, anxiety Duration of problem: several years; Severity of problem: moderate  Patient and/or Family's Strengths/Protective Factors: Social connections, Social and Emotional competence, and Concrete supports in place (healthy food, safe environments, etc.)  Goals Addressed: Patient will:  Reduce symptoms of: anxiety and depression   Demonstrate ability to: Increase healthy adjustment to current life circumstances  Progress towards Goals: Ongoing  Interventions: Interventions utilized:  Mindfulness or Psychologist, educational, CBT Cognitive Behavioral Therapy, and Supportive Counseling Standardized Assessments completed: Not Needed  Supportive  counseling today regarding interpersonal dynamics, particularly with her child's dad. CBT to explore thoughts about self and boundaries and personal needs within the context of their co-parenting relationship. Brief mindfulness activity at the end of session.   Patient and/or Family Response: Patient engaged in session.  Assessment: Patient currently experiencing depression and anxiety which is exacerbated by interpersonal/family dynamics as well as chronic illness.   Patient may benefit from supportive counseling including CBT to explore unhelpful thoughts about self in context of illness and in relationships. Patient may also benefit from mindfulness for coping with elevated anxiety and emotions.  Plan: Follow up with behavioral health clinician on: 04/30/21 Referral(s): [place] (In Clinic)  I discussed the assessment and treatment plan with the patient and/or parent/guardian. They were provided an opportunity to ask questions and all were answered. They agreed with the plan and demonstrated an understanding of the instructions.   They were advised to call back or seek an in-person evaluation if the symptoms worsen or if the condition fails to improve as anticipated.  [name], [facility] [phone]

## 2021-04-18 NOTE — Telephone Encounter (Signed)
Confirmation [id number] WBenefit Plan:MCAIDHealth Plan:[employer]XIX Prior Approval [id number] PA Type:PHARMACYRecipient:Sandra I WILLIAMSRecipient [id number] NBilling Provider:Billing Provider [id number] Provider Name:CRYSTAL Suan Halter Provider [phone] Submission Date:09/15/2022Status:APPROVEDEffective Begin Date:09/15/2022Effective End Date:09/15/2023Payer:[place] [employer] DIV OF HEALTH BENEFITS# of Attachments:1

## 2021-04-21 ENCOUNTER — Telehealth: Payer: Self-pay

## 2021-04-21 NOTE — Telephone Encounter (Signed)
Oxycodone  °

## 2021-04-24 ENCOUNTER — Other Ambulatory Visit: Payer: Self-pay | Admitting: Nurse Practitioner

## 2021-04-24 ENCOUNTER — Telehealth: Payer: Self-pay | Admitting: Nurse Practitioner

## 2021-04-24 DIAGNOSIS — D571 Sickle-cell disease without crisis: Secondary | ICD-10-CM

## 2021-04-24 DIAGNOSIS — Z79891 Long term (current) use of opiate analgesic: Secondary | ICD-10-CM

## 2021-04-24 DIAGNOSIS — G894 Chronic pain syndrome: Secondary | ICD-10-CM

## 2021-04-24 MED ORDER — OXYCODONE HCL 15 MG PO TABS: 15.0000 mg | ORAL_TABLET | ORAL | 0 refills | Status: DC | PRN

## 2021-04-24 NOTE — Telephone Encounter (Signed)
Patient requested a call from the practice manager during a visit with [name]. She expressed that she orders her oxycodone in advance and still does not get the refills when they are due.  Will her Oxycodone 15mg  be sent to the pharmacy today?

## 2021-04-30 ENCOUNTER — Ambulatory Visit (INDEPENDENT_AMBULATORY_CARE_PROVIDER_SITE_OTHER): Payer: Self-pay | Admitting: Clinical

## 2021-04-30 DIAGNOSIS — F319 Bipolar disorder, unspecified: Secondary | ICD-10-CM

## 2021-04-30 DIAGNOSIS — F419 Anxiety disorder, unspecified: Secondary | ICD-10-CM

## 2021-04-30 NOTE — BH Specialist Note (Addendum)
Integrated Behavioral Health via Telemedicine Visit  04/30/2021 Sandra Mckay [id number]  Number of [place] visits: 12/20 Session Start time: 1:25  Session End time: 2:05 Total time: 40   Referring Provider: [name], NP Patient/Family location: [place], [facility] Provider location: Patient [place] All persons participating in visit: [name], patient Types of Service: Individual psychotherapy and Telephone visit  I connected with Sandra Mckay via Telephone and verified that I am speaking with the correct person using two identifiers. Discussed confidentiality: Yes   I discussed the limitations of telemedicine and the availability of in person appointments.  Discussed there is a possibility of technology failure and discussed alternative modes of communication if that failure occurs.  I discussed that engaging in this telemedicine visit, they consent to the provision of behavioral healthcare and the services will be billed under their insurance.  Patient and/or legal guardian expressed understanding and consented to Telemedicine visit: Yes   Presenting Concerns: Patient and/or family reports the following symptoms/concerns: Low motivation for ADLs, depression after being let go from job, anxiety Duration of problem: several years; Severity of problem: moderate  Patient and/or Family's Strengths/Protective Factors: Social connections, Social and Emotional competence, and Concrete supports in place (healthy food, safe environments, etc.)  Goals Addressed: Patient will:  Reduce symptoms of: anxiety and depression   Demonstrate ability to: Increase healthy adjustment to current life circumstances  Progress towards Goals: Ongoing  Interventions: Interventions utilized:  Supportive Counseling Standardized Assessments completed: Not Needed  Supportive counseling today regarding patient's thoughts about self in context of  relationships and as a mother. Patient reports doing well as she works on health and mental health goals. She would like to address some issues with her PCP at next appointment.   Patient and/or Family Response: Patient engaged in session.  Assessment: Patient currently experiencing depression and anxiety which is exacerbated by interpersonal/family dynamics as well as chronic illness.   Patient may benefit from supportive counseling including CBT to explore unhelpful thoughts about self in context of illness and in relationships. Patient may also benefit from mindfulness for coping with elevated anxiety and emotions.  Plan: Follow up with behavioral health clinician on: 05/08/21 Referral(s): [place] (In Clinic)  I discussed the assessment and treatment plan with the patient and/or parent/guardian. They were provided an opportunity to ask questions and all were answered. They agreed with the plan and demonstrated an understanding of the instructions.   They were advised to call back or seek an in-person evaluation if the symptoms worsen or if the condition fails to improve as anticipated.  [name], [facility] [phone]

## 2021-05-07 ENCOUNTER — Telehealth: Payer: Self-pay

## 2021-05-07 NOTE — Telephone Encounter (Signed)
Oxycodone  °

## 2021-05-08 ENCOUNTER — Other Ambulatory Visit: Payer: Self-pay | Admitting: Nurse Practitioner

## 2021-05-08 ENCOUNTER — Ambulatory Visit (INDEPENDENT_AMBULATORY_CARE_PROVIDER_SITE_OTHER): Payer: Medicaid Other | Admitting: Clinical

## 2021-05-08 DIAGNOSIS — F319 Bipolar disorder, unspecified: Secondary | ICD-10-CM

## 2021-05-08 DIAGNOSIS — F419 Anxiety disorder, unspecified: Secondary | ICD-10-CM

## 2021-05-08 DIAGNOSIS — Z79891 Long term (current) use of opiate analgesic: Secondary | ICD-10-CM

## 2021-05-08 DIAGNOSIS — D571 Sickle-cell disease without crisis: Secondary | ICD-10-CM

## 2021-05-08 DIAGNOSIS — G894 Chronic pain syndrome: Secondary | ICD-10-CM

## 2021-05-08 MED ORDER — OXYCODONE HCL ER 10 MG PO T12A: 10.0000 mg | EXTENDED_RELEASE_TABLET | Freq: Two times a day (BID) | ORAL | 0 refills | Status: DC

## 2021-05-08 MED ORDER — OXYCODONE HCL 15 MG PO TABS: 15.0000 mg | ORAL_TABLET | ORAL | 0 refills | Status: DC | PRN

## 2021-05-08 MED ORDER — QUETIAPINE FUMARATE 25 MG PO TABS: 25.0000 mg | ORAL_TABLET | Freq: Every day | ORAL | 3 refills | Status: DC
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
## 2021-05-12 ENCOUNTER — Telehealth: Payer: Self-pay | Admitting: Clinical

## 2021-05-12 NOTE — Telephone Encounter (Addendum)
Integrated Behavioral Health General Follow Up Note   04/09/2021 Name: Sandra Mckay        MRN: [id number]       DOB: 10-12-1995 Sandra Mckay is a 25 y.o. year old female who sees Vevelyn Francois, NP for primary care. LCSW was initially consulted to assist the patient with Mental Health Counseling and Resources.   Interpreter: No.   Interpreter Name & Language: none   Assessment: Patient experiencing bipolar disorder and anxiety.   Ongoing Intervention: Today [name] called patient to check in breifly after last week's [place] (IBH) session. Patient reports feeling better. Scheduled next IBH follow up for 05/14/21.   Review of patient status, including review of consultants reports, relevant laboratory and other test results, and collaboration with appropriate care team members and the patient's provider was performed as part of comprehensive patient evaluation and provision of services.     [name], [facility] [phone]

## 2021-05-14 ENCOUNTER — Ambulatory Visit (INDEPENDENT_AMBULATORY_CARE_PROVIDER_SITE_OTHER): Payer: Medicaid Other | Admitting: Clinical

## 2021-05-14 ENCOUNTER — Other Ambulatory Visit: Payer: Self-pay

## 2021-05-14 DIAGNOSIS — F319 Bipolar disorder, unspecified: Secondary | ICD-10-CM

## 2021-05-14 DIAGNOSIS — F419 Anxiety disorder, unspecified: Secondary | ICD-10-CM
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
## 2021-05-20 ENCOUNTER — Other Ambulatory Visit: Payer: Self-pay

## 2021-05-20 ENCOUNTER — Emergency Department (HOSPITAL_COMMUNITY)
Admission: EM | Admit: 2021-05-20 | Discharge: 2021-05-20 | Disposition: A | Discharge status: Home / Self Care | Payer: Medicaid Other | Attending: Emergency Medicine | Admitting: Emergency Medicine

## 2021-05-20 ENCOUNTER — Encounter (HOSPITAL_COMMUNITY): Payer: Self-pay | Admitting: Emergency Medicine

## 2021-05-20 DIAGNOSIS — Z91040 Latex allergy status: Secondary | ICD-10-CM | POA: Diagnosis not present

## 2021-05-20 DIAGNOSIS — Z79899 Other long term (current) drug therapy: Secondary | ICD-10-CM | POA: Insufficient documentation

## 2021-05-20 DIAGNOSIS — D5700 Hb-SS disease with crisis, unspecified: Secondary | ICD-10-CM | POA: Insufficient documentation

## 2021-05-20 DIAGNOSIS — Z87891 Personal history of nicotine dependence: Secondary | ICD-10-CM | POA: Insufficient documentation

## 2021-05-20 DIAGNOSIS — I10 Essential (primary) hypertension: Secondary | ICD-10-CM | POA: Diagnosis not present

## 2021-05-20 LAB — COMPREHENSIVE METABOLIC PANEL
ALT: 39 U/L (ref 0–44)
AST: 40 U/L (ref 15–41)
Albumin: 4.8 g/dL (ref 3.5–5.0)
Alkaline Phosphatase: 93 U/L (ref 38–126)
Anion gap: 10 (ref 5–15)
BUN: 8 mg/dL (ref 6–20)
CO2: 21 mmol/L — ABNORMAL LOW (ref 22–32)
Calcium: 9.5 mg/dL (ref 8.9–10.3)
Chloride: 105 mmol/L (ref 98–111)
Creatinine, Ser: 0.37 mg/dL — ABNORMAL LOW (ref 0.44–1.00)
GFR, Estimated: >60 mL/min (ref >60)
Glucose, Bld: 92 mg/dL (ref 70–99)
Potassium: 3.6 mmol/L (ref 3.5–5.1)
Sodium: 136 mmol/L (ref 135–145)
Total Bilirubin: 2.7 mg/dL — ABNORMAL HIGH (ref 0.3–1.2)
Total Protein: 7.6 g/dL (ref 6.5–8.1)

## 2021-05-20 LAB — CBC WITH DIFFERENTIAL/PLATELET
Abs Immature Granulocytes: 0.04 10*3/uL (ref 0.00–0.07)
Basophils Absolute: 0.1 10*3/uL (ref 0.0–0.1)
Basophils Relative: 1 %
Eosinophils Absolute: 0 10*3/uL (ref 0.0–0.5)
Eosinophils Relative: 0 %
HCT: 21.6 % — ABNORMAL LOW (ref 36.0–46.0)
Hemoglobin: 7.6 g/dL — ABNORMAL LOW (ref 12.0–15.0)
Immature Granulocytes: 0 %
Lymphocytes Relative: 39 %
Lymphs Abs: 4 10*3/uL (ref 0.7–4.0)
MCH: 38.4 pg — ABNORMAL HIGH (ref 26.0–34.0)
MCHC: 35.2 g/dL (ref 30.0–36.0)
MCV: 109.1 fL — ABNORMAL HIGH (ref 80.0–100.0)
Monocytes Absolute: 1.9 10*3/uL — ABNORMAL HIGH (ref 0.1–1.0)
Monocytes Relative: 18 %
Neutro Abs: 4.3 10*3/uL (ref 1.7–7.7)
Neutrophils Relative %: 42 %
Platelets: 452 10*3/uL — ABNORMAL HIGH (ref 150–400)
RBC: 1.98 MIL/uL — ABNORMAL LOW (ref 3.87–5.11)
RDW: 18.5 % — ABNORMAL HIGH (ref 11.5–15.5)
WBC: 10.3 10*3/uL (ref 4.0–10.5)
nRBC: 0.7 % — ABNORMAL HIGH (ref 0.0–0.2)

## 2021-05-20 LAB — RETICULOCYTES
Immature Retic Fract: 43.8 % — ABNORMAL HIGH (ref 2.3–15.9)
RBC.: 1.99 MIL/uL — ABNORMAL LOW (ref 3.87–5.11)
Retic Count, Absolute: 393.8 10*3/uL — ABNORMAL HIGH (ref 19.0–186.0)
Retic Ct Pct: 19.8 % — ABNORMAL HIGH (ref 0.4–3.1)

## 2021-05-20 LAB — I-STAT BETA HCG BLOOD, ED (MC, WL, AP ONLY): I-stat hCG, quantitative: <5 m[IU]/mL (ref <5)

## 2021-05-20 MED ORDER — OXYCODONE HCL 5 MG PO TABS
10.0000 mg | ORAL_TABLET | Freq: Once | ORAL | Status: AC
  Administered 2021-05-20: 10 mg via ORAL
  Filled 2021-05-20: qty 2

## 2021-05-20 MED ORDER — DIPHENHYDRAMINE HCL 50 MG/ML IJ SOLN
25.0000 mg | Freq: Once | INTRAMUSCULAR | Status: AC
  Administered 2021-05-20: 25 mg via INTRAVENOUS
  Filled 2021-05-20: qty 1

## 2021-05-20 MED ORDER — HYDROMORPHONE HCL 2 MG/ML IJ SOLN
2.0000 mg | INTRAMUSCULAR | Status: AC
  Administered 2021-05-20: 2 mg via INTRAVENOUS
  Filled 2021-05-20: qty 1

## 2021-05-20 MED ORDER — OXYCODONE HCL 5 MG PO TABS
15.0000 mg | ORAL_TABLET | Freq: Once | ORAL | Status: AC
  Administered 2021-05-20: 15 mg via ORAL
  Filled 2021-05-20: qty 3

## 2021-05-20 MED ORDER — ONDANSETRON HCL 4 MG/2ML IJ SOLN
4.0000 mg | INTRAMUSCULAR | Status: DC | PRN
  Administered 2021-05-20: 4 mg via INTRAVENOUS
  Filled 2021-05-20: qty 2

## 2021-05-20 MED ORDER — KETOROLAC TROMETHAMINE 15 MG/ML IJ SOLN
15.0000 mg | Freq: Once | INTRAMUSCULAR | Status: AC
  Administered 2021-05-20: 15 mg via INTRAVENOUS
  Filled 2021-05-20: qty 1

## 2021-05-20 NOTE — ED Triage Notes (Signed)
Pt states that she is having hip, lower back and knee pain associated with sickle cell crisis. States she was seen at [facility] yesterday but feels worse. Alert and oriented.
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
## 2021-05-20 NOTE — Discharge Instructions (Addendum)
We saw you in the ER for your sickle cell related pain.  The labs are overall reassuring. We understand you are in sickle cell related pain none the less, and advise to call your sickle cell doctor for a followup as soon as possible. If your symptoms get worse, return to the ER.

## 2021-05-21 ENCOUNTER — Ambulatory Visit (INDEPENDENT_AMBULATORY_CARE_PROVIDER_SITE_OTHER): Payer: Medicaid Other | Admitting: Clinical

## 2021-05-21 DIAGNOSIS — F319 Bipolar disorder, unspecified: Secondary | ICD-10-CM

## 2021-05-21 DIAGNOSIS — F419 Anxiety disorder, unspecified: Secondary | ICD-10-CM
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
## 2021-05-23 ENCOUNTER — Other Ambulatory Visit: Payer: Self-pay | Admitting: Nurse Practitioner

## 2021-05-23 ENCOUNTER — Telehealth: Payer: Self-pay

## 2021-05-23 DIAGNOSIS — G894 Chronic pain syndrome: Secondary | ICD-10-CM

## 2021-05-23 DIAGNOSIS — D571 Sickle-cell disease without crisis: Secondary | ICD-10-CM

## 2021-05-23 DIAGNOSIS — Z79891 Long term (current) use of opiate analgesic: Secondary | ICD-10-CM

## 2021-05-23 MED ORDER — OXYCODONE HCL 15 MG PO TABS: 15.0000 mg | ORAL_TABLET | ORAL | 0 refills | Status: DC | PRN

## 2021-05-23 NOTE — Telephone Encounter (Signed)
Pt ask for refills said it's due tomorrow Oxycodone 15 mg

## 2021-05-28 ENCOUNTER — Encounter: Payer: Self-pay | Admitting: Clinical

## 2021-05-30 ENCOUNTER — Ambulatory Visit: Payer: Self-pay | Admitting: Nurse Practitioner

## 2021-06-04 ENCOUNTER — Telehealth: Payer: Self-pay

## 2021-06-04 NOTE — Telephone Encounter (Signed)
Oxycodone 15 mg  Due on Sunday 06/08/21

## 2021-06-05 ENCOUNTER — Other Ambulatory Visit: Payer: Self-pay | Admitting: Nurse Practitioner

## 2021-06-05 DIAGNOSIS — G894 Chronic pain syndrome: Secondary | ICD-10-CM

## 2021-06-05 DIAGNOSIS — D571 Sickle-cell disease without crisis: Secondary | ICD-10-CM

## 2021-06-05 DIAGNOSIS — Z79891 Long term (current) use of opiate analgesic: Secondary | ICD-10-CM

## 2021-06-05 MED ORDER — OXYCODONE HCL 15 MG PO TABS: 15.0000 mg | ORAL_TABLET | ORAL | 0 refills | Status: DC | PRN

## 2021-06-06 ENCOUNTER — Telehealth: Payer: Self-pay | Admitting: Clinical

## 2021-06-06 NOTE — Telephone Encounter (Signed)
Integrated Behavioral Health General Follow Up Note   06/06/2021 Name: Sandra Mckay        MRN: [id number]       DOB: 01/09/1996 Sandra Mckay is a 25 y.o. year old female who sees Vevelyn Francois, NP for primary care. LCSW was initially consulted to assist the patient with Mental Health Counseling and Resources.   Interpreter: No.   Interpreter Name & Language: none   Assessment: Patient experiencing bipolar disorder and anxiety.   Ongoing Intervention: Today [name] called patient to follow up, as patient missed last scheduled [place] (IB[facility]) appointment. Rescheduled for 06/10/21.   Review of patient status, including review of consultants reports, relevant laboratory and other test results, and collaboration with appropriate care team members and the patient's provider was performed as part of comprehensive patient evaluation and provision of services.     [name], [facility] [phone]

## 2021-06-10 ENCOUNTER — Other Ambulatory Visit: Payer: Self-pay

## 2021-06-10 ENCOUNTER — Encounter (HOSPITAL_COMMUNITY): Payer: Self-pay

## 2021-06-10 ENCOUNTER — Emergency Department (HOSPITAL_COMMUNITY): Payer: Medicaid Other

## 2021-06-10 ENCOUNTER — Inpatient Hospital Stay (HOSPITAL_COMMUNITY)
Admission: EM | Admit: 2021-06-10 | Discharge: 2021-06-13 | DRG: 812 | Disposition: A | Discharge status: Home / Self Care | Payer: Medicaid Other | Attending: Internal Medicine | Admitting: Internal Medicine

## 2021-06-10 ENCOUNTER — Ambulatory Visit (INDEPENDENT_AMBULATORY_CARE_PROVIDER_SITE_OTHER): Payer: Medicaid Other | Admitting: Clinical

## 2021-06-10 DIAGNOSIS — Z79891 Long term (current) use of opiate analgesic: Secondary | ICD-10-CM

## 2021-06-10 DIAGNOSIS — F1210 Cannabis abuse, uncomplicated: Secondary | ICD-10-CM | POA: Diagnosis present

## 2021-06-10 DIAGNOSIS — D649 Anemia, unspecified: Secondary | ICD-10-CM | POA: Diagnosis not present

## 2021-06-10 DIAGNOSIS — R0902 Hypoxemia: Secondary | ICD-10-CM | POA: Diagnosis present

## 2021-06-10 DIAGNOSIS — Z87891 Personal history of nicotine dependence: Secondary | ICD-10-CM

## 2021-06-10 DIAGNOSIS — Z20822 Contact with and (suspected) exposure to covid-19: Secondary | ICD-10-CM | POA: Diagnosis present

## 2021-06-10 DIAGNOSIS — D5700 Hb-SS disease with crisis, unspecified: Principal | ICD-10-CM | POA: Diagnosis present

## 2021-06-10 DIAGNOSIS — F419 Anxiety disorder, unspecified: Secondary | ICD-10-CM

## 2021-06-10 DIAGNOSIS — I272 Pulmonary hypertension, unspecified: Secondary | ICD-10-CM | POA: Diagnosis present

## 2021-06-10 DIAGNOSIS — J101 Influenza due to other identified influenza virus with other respiratory manifestations: Secondary | ICD-10-CM | POA: Diagnosis not present

## 2021-06-10 DIAGNOSIS — Z79899 Other long term (current) drug therapy: Secondary | ICD-10-CM

## 2021-06-10 DIAGNOSIS — E871 Hypo-osmolality and hyponatremia: Secondary | ICD-10-CM | POA: Diagnosis present

## 2021-06-10 DIAGNOSIS — R7401 Elevation of levels of liver transaminase levels: Secondary | ICD-10-CM | POA: Diagnosis present

## 2021-06-10 DIAGNOSIS — F1410 Cocaine abuse, uncomplicated: Secondary | ICD-10-CM | POA: Diagnosis present

## 2021-06-10 DIAGNOSIS — Z91040 Latex allergy status: Secondary | ICD-10-CM

## 2021-06-10 DIAGNOSIS — F319 Bipolar disorder, unspecified: Secondary | ICD-10-CM

## 2021-06-10 DIAGNOSIS — D539 Nutritional anemia, unspecified: Secondary | ICD-10-CM | POA: Diagnosis present

## 2021-06-10 LAB — CBC WITH DIFFERENTIAL/PLATELET
Abs Immature Granulocytes: 0.11 10*3/uL — ABNORMAL HIGH (ref 0.00–0.07)
Basophils Absolute: 0.1 10*3/uL (ref 0.0–0.1)
Basophils Relative: 1 %
Eosinophils Absolute: 0.1 10*3/uL (ref 0.0–0.5)
Eosinophils Relative: 1 %
HCT: 21.6 % — ABNORMAL LOW (ref 36.0–46.0)
Hemoglobin: 7.8 g/dL — ABNORMAL LOW (ref 12.0–15.0)
Immature Granulocytes: 1 %
Lymphocytes Relative: 10 %
Lymphs Abs: 1 10*3/uL (ref 0.7–4.0)
MCH: 39.4 pg — ABNORMAL HIGH (ref 26.0–34.0)
MCHC: 36.1 g/dL — ABNORMAL HIGH (ref 30.0–36.0)
MCV: 109.1 fL — ABNORMAL HIGH (ref 80.0–100.0)
Monocytes Absolute: 2 10*3/uL — ABNORMAL HIGH (ref 0.1–1.0)
Monocytes Relative: 19 %
Neutro Abs: 7.1 10*3/uL (ref 1.7–7.7)
Neutrophils Relative %: 68 %
Platelets: 449 10*3/uL — ABNORMAL HIGH (ref 150–400)
RBC: 1.98 MIL/uL — ABNORMAL LOW (ref 3.87–5.11)
RDW: 20.3 % — ABNORMAL HIGH (ref 11.5–15.5)
WBC: 10.4 10*3/uL (ref 4.0–10.5)
nRBC: 3.1 % — ABNORMAL HIGH (ref 0.0–0.2)

## 2021-06-10 LAB — RESP PANEL BY RT-PCR (FLU A&B, COVID) ARPGX2
Influenza A by PCR: POSITIVE — AB
Influenza B by PCR: NEGATIVE
SARS Coronavirus 2 by RT PCR: NEGATIVE

## 2021-06-10 LAB — COMPREHENSIVE METABOLIC PANEL
ALT: 28 U/L (ref 0–44)
AST: 37 U/L (ref 15–41)
Albumin: 4.7 g/dL (ref 3.5–5.0)
Alkaline Phosphatase: 74 U/L (ref 38–126)
Anion gap: 8 (ref 5–15)
BUN: <5 mg/dL — ABNORMAL LOW (ref 6–20)
CO2: 20 mmol/L — ABNORMAL LOW (ref 22–32)
Calcium: 9.2 mg/dL (ref 8.9–10.3)
Chloride: 104 mmol/L (ref 98–111)
Creatinine, Ser: 0.47 mg/dL (ref 0.44–1.00)
GFR, Estimated: >60 mL/min (ref >60)
Glucose, Bld: 96 mg/dL (ref 70–99)
Potassium: 3.5 mmol/L (ref 3.5–5.1)
Sodium: 132 mmol/L — ABNORMAL LOW (ref 135–145)
Total Bilirubin: 2.8 mg/dL — ABNORMAL HIGH (ref 0.3–1.2)
Total Protein: 7.5 g/dL (ref 6.5–8.1)

## 2021-06-10 LAB — RETICULOCYTES
Immature Retic Fract: 40 % — ABNORMAL HIGH (ref 2.3–15.9)
RBC.: 1.98 MIL/uL — ABNORMAL LOW (ref 3.87–5.11)
Retic Count, Absolute: 458 10*3/uL — ABNORMAL HIGH (ref 19.0–186.0)
Retic Ct Pct: 22.3 % — ABNORMAL HIGH (ref 0.4–3.1)

## 2021-06-10 LAB — I-STAT BETA HCG BLOOD, ED (MC, WL, AP ONLY): I-stat hCG, quantitative: <5 m[IU]/mL (ref <5)

## 2021-06-10 MED ORDER — ONDANSETRON HCL 4 MG PO TABS: 4.0000 mg | ORAL_TABLET | ORAL | Status: DC | PRN

## 2021-06-10 MED ORDER — OXYCODONE HCL 5 MG PO TABS
15.0000 mg | ORAL_TABLET | ORAL | Status: DC | PRN
  Administered 2021-06-10: 15 mg via ORAL
  Filled 2021-06-10: qty 3

## 2021-06-10 MED ORDER — ENOXAPARIN SODIUM 40 MG/0.4ML IJ SOSY
40.0000 mg | PREFILLED_SYRINGE | INTRAMUSCULAR | Status: DC
  Administered 2021-06-11: 40 mg via SUBCUTANEOUS
  Filled 2021-06-10 (×2): qty 0.4

## 2021-06-10 MED ORDER — KETOROLAC TROMETHAMINE 15 MG/ML IJ SOLN
15.0000 mg | Freq: Four times a day (QID) | INTRAMUSCULAR | Status: DC
  Administered 2021-06-10 – 2021-06-13 (×9): 15 mg via INTRAVENOUS
  Filled 2021-06-10 (×9): qty 1

## 2021-06-10 MED ORDER — HYDROMORPHONE HCL 2 MG/ML IJ SOLN
2.0000 mg | INTRAMUSCULAR | Status: AC
  Administered 2021-06-10: 2 mg via INTRAVENOUS
  Filled 2021-06-10: qty 1

## 2021-06-10 MED ORDER — DIPHENHYDRAMINE HCL 50 MG/ML IJ SOLN
12.5000 mg | Freq: Four times a day (QID) | INTRAMUSCULAR | Status: DC | PRN
  Administered 2021-06-10 – 2021-06-12 (×2): 12.5 mg via INTRAVENOUS
  Filled 2021-06-10 (×2): qty 1

## 2021-06-10 MED ORDER — SODIUM CHLORIDE 0.9 % IV SOLN: INTRAVENOUS | Status: DC

## 2021-06-10 MED ORDER — ACETAMINOPHEN 500 MG PO TABS
1000.0000 mg | ORAL_TABLET | Freq: Once | ORAL | Status: AC
  Administered 2021-06-10: 1000 mg via ORAL
  Filled 2021-06-10: qty 2

## 2021-06-10 MED ORDER — HYDROMORPHONE HCL 1 MG/ML IJ SOLN
1.0000 mg | INTRAMUSCULAR | Status: DC | PRN
  Administered 2021-06-10: 1 mg via INTRAVENOUS
  Filled 2021-06-10: qty 1

## 2021-06-10 MED ORDER — OSELTAMIVIR PHOSPHATE 75 MG PO CAPS
75.0000 mg | ORAL_CAPSULE | Freq: Two times a day (BID) | ORAL | Status: DC
  Administered 2021-06-10 – 2021-06-13 (×6): 75 mg via ORAL
  Filled 2021-06-10 (×6): qty 1

## 2021-06-10 MED ORDER — HYDROMORPHONE HCL 1 MG/ML IJ SOLN: 1.0000 mg | INTRAMUSCULAR | Status: DC | PRN

## 2021-06-10 MED ORDER — HYDROMORPHONE 1 MG/ML IV SOLN
INTRAVENOUS | Status: DC
  Administered 2021-06-11: 30 mg via INTRAVENOUS
  Administered 2021-06-11: 1.5 mg via INTRAVENOUS
  Administered 2021-06-11: 5.5 mg via INTRAVENOUS
  Administered 2021-06-11: 1.5 mg via INTRAVENOUS
  Administered 2021-06-11: 1 mg via INTRAVENOUS
  Administered 2021-06-11: 4 mg via INTRAVENOUS
  Administered 2021-06-12: 2 mg via INTRAVENOUS
  Administered 2021-06-12 (×2): 0 mg via INTRAVENOUS
  Filled 2021-06-10: qty 30

## 2021-06-10 MED ORDER — POLYETHYLENE GLYCOL 3350 17 G PO PACK: 17.0000 g | PACK | Freq: Every day | ORAL | Status: DC | PRN

## 2021-06-10 MED ORDER — SODIUM CHLORIDE 0.45 % IV SOLN: INTRAVENOUS | Status: DC

## 2021-06-10 MED ORDER — ONDANSETRON HCL 4 MG/2ML IJ SOLN
4.0000 mg | INTRAMUSCULAR | Status: DC | PRN
  Administered 2021-06-11 – 2021-06-12 (×2): 4 mg via INTRAVENOUS
  Filled 2021-06-10 (×2): qty 2

## 2021-06-10 MED ORDER — KETOROLAC TROMETHAMINE 15 MG/ML IJ SOLN
15.0000 mg | INTRAMUSCULAR | Status: AC
  Administered 2021-06-10: 15 mg via INTRAVENOUS
  Filled 2021-06-10: qty 1

## 2021-06-10 MED ORDER — NALOXONE HCL 0.4 MG/ML IJ SOLN: 0.4000 mg | INTRAMUSCULAR | Status: DC | PRN

## 2021-06-10 MED ORDER — SENNOSIDES-DOCUSATE SODIUM 8.6-50 MG PO TABS
1.0000 | ORAL_TABLET | Freq: Two times a day (BID) | ORAL | Status: DC
  Filled 2021-06-10 (×5): qty 1

## 2021-06-10 MED ORDER — SODIUM CHLORIDE 0.9% FLUSH: 9.0000 mL | INTRAVENOUS | Status: DC | PRN

## 2021-06-10 NOTE — ED Triage Notes (Signed)
Pt reports with [facility] today. Pt also states that she has a fever,. Pain all over.

## 2021-06-10 NOTE — H&P (Signed)
History and Physical    Sandra Mckay TRR:[id number] DOB: 1995-09-15 DOA: 06/10/2021  PCP: Vevelyn Francois, NP  Patient coming from: Home  I have personally briefly reviewed patient's old medical records in [place]  Chief Complaint: Body ache and fever  HPI: Sandra Mckay is a 25 y.o. female with medical history significant for sickle cell disease, chronic anemia, bipolar disorder, history of cocaine abuse who presents with concerns of body ache and fever.  Reports that symptoms started today.  Has pain all over.  No sick contact.  No chest pain or shortness of breath.  No nausea, vomiting or diarrhea.  No abdominal pain.  She takes OxyContin 10 mg every 12 hours and immediate release oxycodone 15 mg as needed every 4 hours.    ED Course: She had temperature of 99 Fahrenheit, mildly tachycardic with heart rate of 103, mild hypotension with BP of 90/53.  She did require 2 L via nasal cannula for hypoxia down to 80% following IV pain medication.  Flu A PCR resulted positive. WBC of 10.4, hemoglobin at baseline of 7.8, platelet of 449 Sodium of 132, K of 3.5, creatinine of 0.47, BG of 96  chest x-ray is negative  She was started on oseltamivir and given several doses of IV Dilaudid.  Hospitalist then called for further management of sickle cell pain crisis and flu.  Review of Systems:  Pertinent positives and negatives as above.  Unable to obtain full ROS since patient was very drowsy and reportedly fell asleep during evaluation.  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 She has never used smokeless tobacco. She reports current alcohol use. She reports that she does not use drugs. Has 58 yo child  Allergies  Allergen Reactions   Latex Rash    Family History  Problem Relation Age of Onset   Arthritis Other        grandparent   Stroke Other    Hypertension Other    Diabetes Other        grandparent   Cancer - Other Other        Glioblastoma     Prior to Admission medications   Medication Sig Start Date End Date Taking? Authorizing Provider  folic acid (FOLVITE) 1 MG tablet Take 1 tablet (1 mg total) by mouth daily. 03/23/18   Azzie Glatter, FNP  hydroxyurea (HYDREA) 500 MG capsule TAKE 3 CAPSULES (1,500 MG TOTAL) BY MOUTH DAILY. 08/10/19   Azzie Glatter, FNP  ibuprofen (ADVIL) 800 MG tablet Take 1 tablet (800 mg total) by mouth every 8 (eight) hours as needed. 02/28/21   Vevelyn Francois, NP  megestrol (MEGACE) 40 MG tablet Take 1 tablet (40 mg total) by mouth daily.  04/02/20   Azzie Glatter, FNP  ondansetron (ZOFRAN) 4 MG tablet Take 1 tablet (4 mg total) by mouth every 8 (eight) hours as needed for nausea or vomiting. 02/28/21   Vevelyn Francois, NP  oxyCODONE (OXYCONTIN) 10 mg 12 hr tablet Take 1 tablet (10 mg total) by mouth every 12 (twelve) hours. 05/13/21 06/12/21  Vevelyn Francois, NP  oxyCODONE (ROXICODONE) 15 MG immediate release tablet Take 1 tablet (15 mg total) by mouth every 4 (four) hours as needed for up to 15 days for pain. 06/08/21 06/23/21  Vevelyn Francois, NP  QUEtiapine (SEROQUEL) 25 MG tablet Take 1 tablet (25 mg total) by mouth at bedtime. 05/08/21 05/08/22  Vevelyn Francois, NP  sertraline (ZOLOFT) 50 MG tablet TAKE 1 TABLET BY MOUTH EVERY DAY 11/18/20   Dorena Dew, FNP  tiZANidine (ZANAFLEX) 4 MG tablet TAKE 1 TABLET  BY MOUTH THREE TIMES A DAY 04/11/21   Vevelyn Francois, NP    Physical Exam: Vitals:   06/10/21 2121 06/10/21 2145 06/10/21 2208 06/10/21 2209  BP: 111/66 107/68  (!) 98/53  Pulse: (!) 101 96 98 97  Resp: 16 18 13 18   Temp:      TempSrc:      SpO2: 100% 94% (!) 80% 97%    Constitutional: NAD, calm, comfortable, drowsy thin young female laying in bed asleep Vitals:   06/10/21 2121 06/10/21 2145 06/10/21 2208 06/10/21 2209  BP: 111/66 107/68  (!) 98/53  Pulse: (!) 101 96 98 97  Resp: 16 18 13 18   Temp:      TempSrc:      SpO2: 100% 94% (!) 80% 97%   Eyes: PERRL, lids and conjunctivae normal ENMT: Mucous membranes are moist.  Neck: normal, supple Respiratory: clear to auscultation bilaterally, no wheezing, no crackles. Normal respiratory effort. No accessory muscle use.  Cardiovascular: Regular rate and rhythm, no murmurs / rubs / gallops. No extremity edema.  Abdomen: no tenderness, no masses palpated.  Bowel sounds positive.  Musculoskeletal: no clubbing / cyanosis. No joint deformity upper and lower extremities. Good ROM, no contractures. Normal muscle tone.  Skin: no rashes, lesions, ulcers. No induration Neurologic: CN 2-12 grossly intact. Sensation intact,  Strength 5/5 in all 4.  Psychiatric: Normal judgment and insight. Alert and oriented x 3. Normal mood.     Labs on Admission: I have personally reviewed following labs and imaging studies  CBC: Recent Labs  Lab 06/10/21 2045  WBC 10.4  NEUTROABS 7.1  HGB 7.8*  HCT 21.6*  MCV 109.1*  PLT 469*   Basic Metabolic Panel: Recent Labs  Lab 06/10/21 2045  NA 132*  K 3.5  CL 104  CO2 20*  GLUCOSE 96  BUN <5*  CREATININE 0.47  CALCIUM 9.2   GFR: CrCl cannot be calculated (Unknown ideal weight.). Liver Function Tests: Recent Labs  Lab 06/10/21 2045  AST 37  ALT 28  ALKPHOS 74  BILITOT 2.8*  PROT 7.5  ALBUMIN 4.7   No results for input(s): LIPASE, AMYLASE in the last 168 hours. No results for  input(s): AMMONIA in the last 168 hours. Coagulation Profile: No results for input(s): INR, PROTIME in the last 168 hours. Cardiac Enzymes: No results for input(s): CKTOTAL, CKMB, CKMBINDEX, TROPONINI in the last 168 hours. BNP (last 3 results) No results for input(s): PROBNP in the last 8760 hours. HbA1C: No results for input(s): HGBA1C in the last 72 hours. CBG: No results for input(s): GLUCAP in the last  168 hours. Lipid Profile: No results for input(s): CHOL, HDL, LDLCALC, TRIG, CHOLHDL, LDLDIRECT in the last 72 hours. Thyroid Function Tests: No results for input(s): TSH, T4TOTAL, FREET4, T3FREE, THYROIDAB in the last 72 hours. Anemia Panel: Recent Labs    06/10/21 2045  RETICCTPCT 22.3*   Urine analysis:    Component Value Date/Time   COLORURINE YELLOW 12/05/2016 0337   APPEARANCEUR CLEAR 12/05/2016 0337   LABSPEC 1.015 08/04/2017 1144   PHURINE 7.0 08/04/2017 1144   GLUCOSEU NEGATIVE 08/04/2017 1144   HGBUR NEGATIVE 08/04/2017 1144   BILIRUBINUR neg 08/06/2020 1525   KETONESUR negative 09/27/2018 [place] 08/04/2017 1144   PROTEINUR Negative 08/06/2020 1525   PROTEINUR NEGATIVE 08/04/2017 1144   UROBILINOGEN 0.2 08/06/2020 1525   UROBILINOGEN >=8.0 08/04/2017 1144   NITRITE neg 08/06/2020 1525   NITRITE NEGATIVE 08/04/2017 1144   LEUKOCYTESUR Negative 08/06/2020 1525    Radiological Exams on Admission: DG Chest Port 1 View  Result Date: 06/10/2021 CLINICAL DATA:  Fever. EXAM: PORTABLE CHEST 1 VIEW COMPARISON:  Chest radiograph dated 12/08/2016. FINDINGS: No focal consolidation, pleural effusion or pneumothorax. Top-normal cardiac size. No acute osseous pathology. IMPRESSION: No acute cardiopulmonary process. Electronically Signed   By: Anner Crete M.D.   On: 06/10/2021 21:24      Assessment/Plan  Sickle cell cell pain crisis Triggered by influenza A  -Initiate IV Dilaudid PCA.  Settings of 0.5 mg, 10-minute lockout, and 3 mg/hr. IV  dilaudid 1mg  q3 hr PRN and home PRN oxycodone IR 15mg  q4hr while in ED before PCA pump can be initiated -Continuous 0.45% normal saline at 150 mL/h -IV Toradol 15 mg q6hr  -Follow-up vitals and assess pain frequently  Chronic anemia -Hbg stable at baseline of 7.8. No transfusion needed  Influenza A -Continue daily oseltamivir   DVT prophylaxis:.Lovenox Code Status: Full Family Communication: Plan discussed with patient and mother at bedside  disposition Plan: Home with observation Consults called:  Admission status: Observation  Level of care: Med-Surg  Status is: Observation  The patient remains OBS appropriate and will d/c before 2 midnights.        Orene Desanctis DO [facility]   If 7PM-7AM, please contact night-coverage [email or web address]   06/10/2021, 11:16 PM
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
## 2021-06-10 NOTE — ED Provider Notes (Signed)
[facility] DEPT Provider Note   CSN: [id number] Arrival date & time: 06/10/21  1957     History Chief Complaint  Patient presents with   Fever   Sickle Cell Pain Crisis    Sandra Mckay is a 25 y.o. female history of sickle cell SS disease, here presenting with cough and fever and body aches.  Patient states that she has nonproductive cough today and started having subjective chills and low-grade temperature at home. Patient also has pain all over.  Patient states that she is concerned that she has the flu. She also concerned that she has sickle cell pain crisis. Patient follows up with sickle cell clinic here and is oxycodone at baseline.  Denies any history of acute chest syndrome  The history is provided by the patient.      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[place])    Thrombocytosis 05/2019   Urinary tract infection    Vitamin D deficiency     Patient Active Problem List   Diagnosis Date Noted   Cocaine use 10/15/2020   Medication management 12/12/2018   Bipolar and related disorder ([place]) 04/04/2017   Sickle cell pain crisis ([place]) 12/05/2016   Sickle cell anemia with crisis ([place]) 12/05/2016   Vasovagal syncope 09/18/2016   Closed fracture of lumbar vertebra without spinal cord injury ([place]) 07/15/2016   Nausea without vomiting 09/09/2015   Vitamin D deficiency 07/22/2015   Sickle cell anemia ([place]) 06/08/2015    Past Surgical History:  Procedure Laterality Date   CHOLECYSTECTOMY  2011   EYE SURGERY     Sty removal   [place]   @ [place] 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   TONSILLECTOMY  2012     OB History     Gravida  1   Para      Term       Preterm      AB      Living  1      SAB      IAB      Ectopic      Multiple      Live Births              Family History  Problem Relation Age of Onset   Arthritis Other        grandparent   Stroke Other    Hypertension Other    Diabetes Other        grandparent   Cancer - Other Other        Glioblastoma    Social History   Tobacco Use   Smoking status: Former   Smokeless tobacco: Never  Vaping Use   Vaping Use: Never used  Substance Use Topics   Alcohol use: Yes    Alcohol/week: 0.0 standard drinks    Comment: occ   Drug use: No    Home Medications Prior to Admission medications   Medication Sig Start Date End Date Taking? Authorizing Provider  folic acid (FOLVITE) 1 MG tablet Take 1 tablet (1 mg total) by mouth daily. 03/23/18   Azzie Glatter, FNP  hydroxyurea (HYDREA) 500 MG  capsule TAKE 3 CAPSULES (1,500 MG TOTAL) BY MOUTH DAILY. 08/10/19   Azzie Glatter, FNP  ibuprofen (ADVIL) 800 MG tablet Take 1 tablet (800 mg total) by mouth every 8 (eight) hours as needed. 02/28/21   Vevelyn Francois, NP  megestrol (MEGACE) 40 MG tablet Take 1 tablet (40 mg total) by mouth daily. 04/02/20   Azzie Glatter, FNP  ondansetron (ZOFRAN) 4 MG tablet Take 1 tablet (4 mg total) by mouth every 8 (eight) hours as needed for nausea or vomiting. 02/28/21   Vevelyn Francois, NP  oxyCODONE (OXYCONTIN) 10 mg 12 hr tablet Take 1 tablet (10 mg total) by mouth every 12 (twelve) hours. 05/13/21 06/12/21  Vevelyn Francois, NP  oxyCODONE (ROXICODONE) 15 MG immediate release tablet Take 1 tablet (15 mg total) by mouth every 4 (four) hours as needed for up to 15 days for pain. 06/08/21 06/23/21  Vevelyn Francois, NP  QUEtiapine (SEROQUEL) 25 MG tablet Take 1 tablet (25 mg total) by mouth at bedtime. 05/08/21 05/08/22  Vevelyn Francois, NP  sertraline (ZOLOFT) 50 MG tablet TAKE 1 TABLET BY MOUTH EVERY DAY 11/18/20   Dorena Dew, FNP  tiZANidine (ZANAFLEX) 4 MG tablet TAKE 1 TABLET  BY MOUTH THREE TIMES A DAY 04/11/21   Vevelyn Francois, NP    Allergies    Latex  Review of Systems   Review of Systems  Constitutional:  Positive for fever.  Respiratory:  Positive for cough.   All other systems reviewed and are negative.  Physical Exam Updated Vital Signs BP 107/68   Pulse 96   Temp 99 F (37.2 C) (Oral)   Resp 18   SpO2 94%   Physical Exam Vitals and nursing note reviewed.  Constitutional:      Comments: Uncomfortable  HENT:     Head: Normocephalic.     Nose: Nose normal.     Mouth/Throat:     Mouth: Mucous membranes are dry.  Eyes:     Extraocular Movements: Extraocular movements intact.     Pupils: Pupils are equal, round, and reactive to light.  Cardiovascular:     Rate and Rhythm: Normal rate and regular rhythm.     Pulses: Normal pulses.  Pulmonary:     Comments: Slightly tachypneic.  No wheezing or crackle Abdominal:     General: Abdomen is flat.     Palpations: Abdomen is soft.  Musculoskeletal:        General: Normal range of motion.     Cervical back: Normal range of motion and neck supple.  Skin:    General: Skin is warm.     Capillary Refill: Capillary refill takes less than 2 seconds.  Neurological:     General: No focal deficit present.     Mental Status: She is alert and oriented to person, place, and time.  Psychiatric:        Mood and Affect: Mood normal.        Behavior: Behavior normal.    ED Results / Procedures / Treatments   Labs (all labs ordered are listed, but only abnormal results are displayed) Labs Reviewed  RESP PANEL BY RT-PCR (FLU A&B, COVID) ARPGX2 - Abnormal; Notable for the following components:      Result Value   Influenza A by PCR POSITIVE (*)    All other components within normal limits  CBC WITH DIFFERENTIAL/PLATELET - Abnormal; Notable for the following components:   RBC 1.98 (*)    Hemoglobin  7.8 (*)    HCT 21.6 (*)    MCV 109.1 (*)    MCH 39.4 (*)    MCHC 36.1 (*)    RDW 20.3 (*)     Platelets 449 (*)    nRBC 3.1 (*)    Monocytes Absolute 2.0 (*)    Abs Immature Granulocytes 0.11 (*)    All other components within normal limits  COMPREHENSIVE METABOLIC PANEL - Abnormal; Notable for the following components:   Sodium 132 (*)    CO2 20 (*)    BUN <5 (*)    Total Bilirubin 2.8 (*)    All other components within normal limits  RETICULOCYTES - Abnormal; Notable for the following components:   Retic Ct Pct 22.3 (*)    RBC. 1.98 (*)    Retic Count, Absolute 458.0 (*)    Immature Retic Fract 40.0 (*)    All other components within normal limits  I-STAT BETA HCG BLOOD, ED ([facility], [facility], [facility] ONLY)    EKG None  Radiology DG Chest Port 1 View  Result Date: 06/10/2021 CLINICAL DATA:  Fever. EXAM: PORTABLE CHEST 1 VIEW COMPARISON:  Chest radiograph dated 12/08/2016. FINDINGS: No focal consolidation, pleural effusion or pneumothorax. Top-normal cardiac size. No acute osseous pathology. IMPRESSION: No acute cardiopulmonary process. Electronically Signed   By: Anner Crete M.D.   On: 06/10/2021 21:24    Procedures Procedures   Angiocath insertion Performed by: Wandra Arthurs  Consent: Verbal consent obtained. Risks and benefits: risks, benefits and alternatives were discussed Time out: Immediately prior to procedure a "time out" was called to verify the correct patient, procedure, equipment, support staff and site/side marked as required.  Preparation: Patient was prepped and draped in the usual sterile fashion.  Vein Location: L forearm   Ultrasound Guided  Gauge: 20 long   Normal blood return and flush without difficulty Patient tolerance: Patient tolerated the procedure well with no immediate complications.  CRITICAL CARE Performed by: Wandra Arthurs   Total critical care time: 30 minutes  Critical care time was exclusive of separately billable procedures and treating other patients.  Critical care was necessary to treat or prevent imminent or life-threatening  deterioration.  Critical care was time spent personally by me on the following activities: development of treatment plan with patient and/or surrogate as well as nursing, discussions with consultants, evaluation of patient's response to treatment, examination of patient, obtaining history from patient or surrogate, ordering and performing treatments and interventions, ordering and review of laboratory studies, ordering and review of radiographic studies, pulse oximetry and re-evaluation of patient's condition.   Medications Ordered in ED Medications  0.9 %  sodium chloride infusion ( Intravenous Not Given 06/10/21 2110)  0.45 % sodium chloride infusion ( Intravenous New Bag/Given 06/10/21 2122)  acetaminophen (TYLENOL) tablet 1,000 mg (has no administration in time range)  oseltamivir (TAMIFLU) capsule 75 mg (has no administration in time range)  ketorolac (TORADOL) 15 MG/ML injection 15 mg (15 mg Intravenous Given 06/10/21 2121)  HYDROmorphone (DILAUDID) injection 2 mg (2 mg Intravenous Given 06/10/21 2121)  HYDROmorphone (DILAUDID) injection 2 mg (2 mg Intravenous Given 06/10/21 2203)    ED Course  I have reviewed the triage vital signs and the nursing notes.  Pertinent labs & imaging results that were available during my care of the patient were reviewed by me and considered in my medical decision making (see chart for details).    MDM Rules/Calculators/A&P  Genevie Elman is a 24 y.o. female here with fever, cough and myalgias.  Concern for possible COVID versus flu versus sickle cell pain crisis versus acute chest syndrome.  Patient has low-grade temperature.  We will get CBC and CMP and reticulocyte count chest x-ray and COVID and flu test.  10:16 PM Chest x-ray is clear.  Patient's flu positive.  Patient received second dose of pain medicine and desat to about 85% on room air.  Patient referred on 2 L nasal cannula.  Chest x-ray did not show any infiltrate so  I do not think she has acute chest.  I think the hypoxia likely secondary to flu and pain medicine.  Patient was given Tamiflu.  CBC showed hemoglobin of 7.8 and reticulocyte count is elevated consistent with sickle cell pain crisis. At this point will admit for hypoxia from flu, sickle cell pain crisis.   Final Clinical Impression(s) / ED Diagnoses Final diagnoses:  None    Rx / DC Orders ED Discharge Orders     None        Drenda Freeze, MD 06/10/21 2217

## 2021-06-11 DIAGNOSIS — I272 Pulmonary hypertension, unspecified: Secondary | ICD-10-CM | POA: Diagnosis present

## 2021-06-11 DIAGNOSIS — R7401 Elevation of levels of liver transaminase levels: Secondary | ICD-10-CM | POA: Diagnosis present

## 2021-06-11 DIAGNOSIS — R0902 Hypoxemia: Secondary | ICD-10-CM | POA: Diagnosis present

## 2021-06-11 DIAGNOSIS — J101 Influenza due to other identified influenza virus with other respiratory manifestations: Secondary | ICD-10-CM | POA: Diagnosis present

## 2021-06-11 DIAGNOSIS — D649 Anemia, unspecified: Secondary | ICD-10-CM | POA: Diagnosis not present

## 2021-06-11 DIAGNOSIS — Z20822 Contact with and (suspected) exposure to covid-19: Secondary | ICD-10-CM | POA: Diagnosis present

## 2021-06-11 DIAGNOSIS — F1410 Cocaine abuse, uncomplicated: Secondary | ICD-10-CM | POA: Diagnosis present

## 2021-06-11 DIAGNOSIS — F1210 Cannabis abuse, uncomplicated: Secondary | ICD-10-CM | POA: Diagnosis present

## 2021-06-11 DIAGNOSIS — E871 Hypo-osmolality and hyponatremia: Secondary | ICD-10-CM | POA: Diagnosis present

## 2021-06-11 DIAGNOSIS — F319 Bipolar disorder, unspecified: Secondary | ICD-10-CM

## 2021-06-11 DIAGNOSIS — Z87891 Personal history of nicotine dependence: Secondary | ICD-10-CM | POA: Diagnosis not present

## 2021-06-11 DIAGNOSIS — D539 Nutritional anemia, unspecified: Secondary | ICD-10-CM | POA: Diagnosis present

## 2021-06-11 DIAGNOSIS — Z79891 Long term (current) use of opiate analgesic: Secondary | ICD-10-CM | POA: Diagnosis not present

## 2021-06-11 DIAGNOSIS — Z79899 Other long term (current) drug therapy: Secondary | ICD-10-CM | POA: Diagnosis not present

## 2021-06-11 DIAGNOSIS — D5700 Hb-SS disease with crisis, unspecified: Secondary | ICD-10-CM | POA: Diagnosis not present

## 2021-06-11 DIAGNOSIS — Z91040 Latex allergy status: Secondary | ICD-10-CM | POA: Diagnosis not present

## 2021-06-11 LAB — HIV ANTIBODY (ROUTINE TESTING W REFLEX): HIV Screen 4th Generation wRfx: NONREACTIVE

## 2021-06-11 MED ORDER — SERTRALINE HCL 50 MG PO TABS
50.0000 mg | ORAL_TABLET | Freq: Every day | ORAL | Status: DC
  Administered 2021-06-12 – 2021-06-13 (×2): 50 mg via ORAL
  Filled 2021-06-11 (×2): qty 1

## 2021-06-11 MED ORDER — OXYCODONE HCL ER 10 MG PO T12A
10.0000 mg | EXTENDED_RELEASE_TABLET | Freq: Two times a day (BID) | ORAL | Status: DC
  Administered 2021-06-11 – 2021-06-13 (×4): 10 mg via ORAL
  Filled 2021-06-11 (×4): qty 1

## 2021-06-11 MED ORDER — FOLIC ACID 1 MG PO TABS
1.0000 mg | ORAL_TABLET | Freq: Every day | ORAL | Status: DC
  Administered 2021-06-12 – 2021-06-13 (×2): 1 mg via ORAL
  Filled 2021-06-11 (×2): qty 1

## 2021-06-11 MED ORDER — QUETIAPINE FUMARATE 25 MG PO TABS
25.0000 mg | ORAL_TABLET | Freq: Every day | ORAL | Status: DC
  Administered 2021-06-11 – 2021-06-12 (×2): 25 mg via ORAL
  Filled 2021-06-11 (×2): qty 1

## 2021-06-11 NOTE — Progress Notes (Signed)
PROGRESS NOTE    Sandra Mckay  PJA:[id number] DOB: [birth year] DOA: 06/10/2021 PCP: Vevelyn Francois, NP    Brief Narrative:  Sandra Mckay is a 25 year old female with past medical history significant for sickle cell disease, chronic anemia, bipolar disorder, history of cocaine abuse who presented to [facility] ED on 11/8 with complaints of body aches and fever.  Patient denies any sick contacts.  Reports pain is "all over".  Denies chest pain, no shortness of breath, no nausea/vomiting/diarrhea.  At baseline, patient takes OxyContin 10 mg every 12 hours and oxycodone 50 mg every 4 hours as needed.  In the ED, temperature 9 9.0 F, HR 103, RR 16, BP 111/66, SPO2 97% on room air; but dropped to 80% and was placed on 2 L nasal cannula.  Sodium 132, potassium 3.5, chloride 104, CO2 20, glucose 96, BUN less than 5, creatinine 0.47, AST 37, ALT 28, WBC 10.4, hemoglobin 7.8, platelets 449.  MCV 109.1.  Reticulocyte count 22.3.  hCG negative.  COVID-19 PCR negative.  Influenza A PCR positive.  Chest x-ray with no acute cardiopulmonary disease process.  Patient was started on Tamiflu given several doses of IV Dilaudid by EDP.  Hospital service consulted for further management of sickle cell pain crisis and influenza A.     Assessment & Plan:   Active Problems:   Bipolar and related disorder (HCC)   Sickle cell crisis (HCC)   Chronic anemia   Influenza A   Sickle cell pain crisis Etiology likely triggered by influenza viral infection.  Follows with sickle cell clinic outpatient, Dionisio David, NP.  On OxyContin 10 mg p.o. twice daily and oxycodone 5 mg p.o. every 4 hours as needed at home. --OxyContin 10 mg p.o. twice daily --Dilaudid PCA --0.45% NS at 156mL/hr  Influenza A --Oseltamivir 75mg  PO BID x 5 days --Titrate supplemental oxygen, maintain SPO2 greater than 92%  Hyponatremia Sodium 132 on ED presentation.  On half NS at 146mL/hr to avoid hypernatremia/dehydration in the setting of  sickle cell disease as above. -- BMP in a.m   Chronic anemia, macrocytic Hemoglobin 7.8, stable. --Continue folic acid --Repeat CBC in the a.m.  Hx substance abuse History of cocaine, THC abuse.  UDS 10/15/2020 positive for cocaine, THC.   DVT prophylaxis: enoxaparin (LOVENOX) injection 40 mg Start: 06/11/21 1000   Code Status: Full Code Family Communication: Updated patient's mother present at bedside this morning  Disposition Plan:  Level of care: Med-Surg Status is: Observation  The patient remains OBS appropriate and will d/c before 2 midnights.   Consultants:  None  Procedures:  None  Antimicrobials:  Oseltamivir 11/8>>   Subjective: Patient seen examined at bedside, resting comfortably.  Eating but arousable.  Continues on Dilaudid PCA.  Titrating down supplemental oxygen.  Updated patient's mother present at bedside.  No other complaints or concerns at this time.  Denies headache, no visual changes, no chest pain, no palpitations, no shortness of breath, no abdominal pain.  No acute events overnight per nursing staff.  Objective: Vitals:   06/11/21 0626 06/11/21 0759 06/11/21 0936 06/11/21 1240  BP: 102/74  (!) 95/53   Pulse: 61  71   Resp: 13 11 14 14   Temp: 98.4 F (36.9 C)  97.9 F (36.6 C)   TempSrc: Oral     SpO2: 100% 97% 100% 94%    Intake/Output Summary (Last 24 hours) at 06/11/2021 1327 Last data filed at 06/11/2021 0900 Gross per 24 hour  Intake 1942.69 ml  Output 0  ml  Net 1942.69 ml   There were no vitals filed for this visit.  Examination:  General exam: Appears calm and comfortable  Respiratory system: Clear to auscultation. Respiratory effort normal.  On 2 L nasal cannula with SPO2 98% at rest. Cardiovascular system: S1 & S2 heard, RRR. No JVD, murmurs, rubs, gallops or clicks. No pedal edema. Gastrointestinal system: Abdomen is nondistended, soft and nontender. No organomegaly or masses felt. Normal bowel sounds heard. Central  nervous system: Alert and oriented. No focal neurological deficits. Extremities: Symmetric 5 x 5 power. Skin: No rashes, lesions or ulcers Psychiatry: Judgement and insight appear normal. Mood & affect appropriate.     Data Reviewed: I have personally reviewed following labs and imaging studies  CBC: Recent Labs  Lab 06/10/21 2045  WBC 10.4  NEUTROABS 7.1  HGB 7.8*  HCT 21.6*  MCV 109.1*  PLT 825*   Basic Metabolic Panel: Recent Labs  Lab 06/10/21 2045  NA 132*  K 3.5  CL 104  CO2 20*  GLUCOSE 96  BUN <5*  CREATININE 0.47  CALCIUM 9.2   GFR: CrCl cannot be calculated (Unknown ideal weight.). Liver Function Tests: Recent Labs  Lab 06/10/21 2045  AST 37  ALT 28  ALKPHOS 74  BILITOT 2.8*  PROT 7.5  ALBUMIN 4.7   No results for input(s): LIPASE, AMYLASE in the last 168 hours. No results for input(s): AMMONIA in the last 168 hours. Coagulation Profile: No results for input(s): INR, PROTIME in the last 168 hours. Cardiac Enzymes: No results for input(s): CKTOTAL, CKMB, CKMBINDEX, TROPONINI in the last 168 hours. BNP (last 3 results) No results for input(s): PROBNP in the last 8760 hours. HbA1C: No results for input(s): HGBA1C in the last 72 hours. CBG: No results for input(s): GLUCAP in the last 168 hours. Lipid Profile: No results for input(s): CHOL, HDL, LDLCALC, TRIG, CHOLHDL, LDLDIRECT in the last 72 hours. Thyroid Function Tests: No results for input(s): TSH, T4TOTAL, FREET4, T3FREE, THYROIDAB in the last 72 hours. Anemia Panel: Recent Labs    06/10/21 2045  RETICCTPCT 22.3*   Sepsis Labs: No results for input(s): PROCALCITON, LATICACIDVEN in the last 168 hours.  Recent Results (from the past 240 hour(s))  Resp Panel by RT-PCR (Flu A&B, Covid) Nasopharyngeal Swab     Status: Abnormal   Collection Time: 06/10/21  8:46 PM   Specimen: Nasopharyngeal Swab; Nasopharyngeal(NP) swabs in vial transport medium  Result Value Ref Range Status   SARS  Coronavirus 2 by RT PCR NEGATIVE NEGATIVE Final    Comment: (NOTE) SARS-CoV-2 target nucleic acids are NOT DETECTED.  The SARS-CoV-2 RNA is generally detectable in upper respiratory specimens during the acute phase of infection. The lowest concentration of SARS-CoV-2 viral copies this assay can detect is 138 copies/mL. A negative result does not preclude SARS-Cov-2 infection and should not be used as the sole basis for treatment or other patient management decisions. A negative result may occur with  improper specimen collection/handling, submission of specimen other than nasopharyngeal swab, presence of viral mutation(s) within the areas targeted by this assay, and inadequate number of viral copies(<138 copies/mL). A negative result must be combined with clinical observations, patient history, and epidemiological information. The expected result is Negative.  Fact Sheet for Patients:  EntrepreneurPulse.com.au  Fact Sheet for Healthcare Providers:  IncredibleEmployment.be  This test is no t yet approved or cleared by the [place] FDA and  has been authorized for detection and/or diagnosis of SARS-CoV-2 by FDA under an Emergency  Use Authorization (EUA). This EUA will remain  in effect (meaning this test can be used) for the duration of the COVID-19 declaration under Section 564(b)(1) of the Act, 21 U.S.C.section 360bbb-3(b)(1), unless the authorization is terminated  or revoked sooner.       Influenza A by PCR POSITIVE (A) NEGATIVE Final   Influenza B by PCR NEGATIVE NEGATIVE Final    Comment: (NOTE) The Xpert Xpress SARS-CoV-2/FLU/RSV plus assay is intended as an aid in the diagnosis of influenza from Nasopharyngeal swab specimens and should not be used as a sole basis for treatment. Nasal washings and aspirates are unacceptable for Xpert Xpress SARS-CoV-2/FLU/RSV testing.  Fact Sheet for  Patients: EntrepreneurPulse.com.au  Fact Sheet for Healthcare Providers: IncredibleEmployment.be  This test is not yet approved or cleared by the [place] FDA and has been authorized for detection and/or diagnosis of SARS-CoV-2 by FDA under an Emergency Use Authorization (EUA). This EUA will remain in effect (meaning this test can be used) for the duration of the COVID-19 declaration under Section 564(b)(1) of the Act, 21 U.S.C. section 360bbb-3(b)(1), unless the authorization is terminated or revoked.  Performed at [facility], [place] [address] [place], [place] [address]          Radiology Studies: [facility] Chest Port 1 View  Result Date: 06/10/2021 CLINICAL DATA:  Fever. EXAM: PORTABLE CHEST 1 VIEW COMPARISON:  Chest radiograph dated 12/08/2016. FINDINGS: No focal consolidation, pleural effusion or pneumothorax. Top-normal cardiac size. No acute osseous pathology. IMPRESSION: No acute cardiopulmonary process. Electronically Signed   By: Anner Crete M.D.   On: 06/10/2021 21:24        Scheduled Meds:  enoxaparin (LOVENOX) injection  40 mg Subcutaneous Q24H   [START ON 95/62/1308] folic acid  1 mg Oral Daily   HYDROmorphone   Intravenous Q4H   ketorolac  15 mg Intravenous Q6H   oseltamivir  75 mg Oral BID   oxyCODONE  10 mg Oral Q12H   QUEtiapine  25 mg Oral QHS   senna-docusate  1 tablet Oral BID   [START ON 06/12/2021] sertraline  50 mg Oral Daily   Continuous Infusions:  sodium chloride 100 mL/hr at 06/11/21 0750   sodium chloride       LOS: 0 days    Time spent: 42 minutes spent on chart review, discussion with nursing staff, consultants, updating family and interview/physical exam; more than 50% of that time was spent in counseling and/or coordination of care.    Vantasia Pinkney J [place] DO [facility] Available via Epic secure chat 7am-7pm After these hours, please refer to coverage provider listed  on [email or web address] 06/11/2021, 1:27 PM

## 2021-06-12 DIAGNOSIS — D5700 Hb-SS disease with crisis, unspecified: Secondary | ICD-10-CM | POA: Diagnosis not present

## 2021-06-12 DIAGNOSIS — D649 Anemia, unspecified: Secondary | ICD-10-CM | POA: Diagnosis not present

## 2021-06-12 DIAGNOSIS — F319 Bipolar disorder, unspecified: Secondary | ICD-10-CM | POA: Diagnosis not present

## 2021-06-12 DIAGNOSIS — J101 Influenza due to other identified influenza virus with other respiratory manifestations: Secondary | ICD-10-CM | POA: Diagnosis not present

## 2021-06-12 LAB — COMPREHENSIVE METABOLIC PANEL
ALT: 83 U/L — ABNORMAL HIGH (ref 0–44)
AST: 113 U/L — ABNORMAL HIGH (ref 15–41)
Albumin: 3.8 g/dL (ref 3.5–5.0)
Alkaline Phosphatase: 143 U/L — ABNORMAL HIGH (ref 38–126)
Anion gap: 9 (ref 5–15)
BUN: 8 mg/dL (ref 6–20)
CO2: 22 mmol/L (ref 22–32)
Calcium: 8.6 mg/dL — ABNORMAL LOW (ref 8.9–10.3)
Chloride: 104 mmol/L (ref 98–111)
Creatinine, Ser: 0.39 mg/dL — ABNORMAL LOW (ref 0.44–1.00)
GFR, Estimated: >60 mL/min (ref >60)
Glucose, Bld: 88 mg/dL (ref 70–99)
Potassium: 3.5 mmol/L (ref 3.5–5.1)
Sodium: 135 mmol/L (ref 135–145)
Total Bilirubin: 7 mg/dL — ABNORMAL HIGH (ref 0.3–1.2)
Total Protein: 6.4 g/dL — ABNORMAL LOW (ref 6.5–8.1)

## 2021-06-12 LAB — MAGNESIUM: Magnesium: 2.1 mg/dL (ref 1.7–2.4)

## 2021-06-12 LAB — CBC
HCT: 15.4 % — ABNORMAL LOW (ref 36.0–46.0)
Hemoglobin: 5.7 g/dL — CL (ref 12.0–15.0)
MCH: 38 pg — ABNORMAL HIGH (ref 26.0–34.0)
MCHC: 37 g/dL — ABNORMAL HIGH (ref 30.0–36.0)
MCV: 102.7 fL — ABNORMAL HIGH (ref 80.0–100.0)
Platelets: 266 10*3/uL (ref 150–400)
RBC: 1.5 MIL/uL — ABNORMAL LOW (ref 3.87–5.11)
RDW: 18.5 % — ABNORMAL HIGH (ref 11.5–15.5)
WBC: 5.2 10*3/uL (ref 4.0–10.5)
nRBC: 1.2 % — ABNORMAL HIGH (ref 0.0–0.2)

## 2021-06-12 LAB — PREPARE RBC (CROSSMATCH)

## 2021-06-12 MED ORDER — HYDROXYUREA 500 MG PO CAPS: 1500.0000 mg | ORAL_CAPSULE | Freq: Every day | ORAL | Status: DC

## 2021-06-12 MED ORDER — ACETAMINOPHEN 325 MG PO TABS
650.0000 mg | ORAL_TABLET | Freq: Four times a day (QID) | ORAL | Status: DC | PRN
  Administered 2021-06-12: 650 mg via ORAL
  Filled 2021-06-12: qty 2

## 2021-06-12 MED ORDER — HYDROMORPHONE HCL 1 MG/ML IJ SOLN
0.5000 mg | INTRAMUSCULAR | Status: DC | PRN
  Administered 2021-06-12 – 2021-06-13 (×3): 0.5 mg via INTRAVENOUS
  Filled 2021-06-12 (×3): qty 0.5

## 2021-06-12 MED ORDER — SODIUM CHLORIDE 0.9% IV SOLUTION: Freq: Once | INTRAVENOUS | Status: DC

## 2021-06-12 MED ORDER — OXYCODONE HCL 5 MG PO TABS
5.0000 mg | ORAL_TABLET | ORAL | Status: DC | PRN
  Administered 2021-06-12 (×2): 5 mg via ORAL
  Filled 2021-06-12 (×2): qty 1

## 2021-06-12 NOTE — Progress Notes (Signed)
Patient has refused vitals

## 2021-06-12 NOTE — Progress Notes (Signed)
Hydroxyurea (Hydrea) hold criteria: ANC < 2 Pltc < 80K in sickle-cell patients; < 100K in other patients Hgb <= 6 in sickle-cell patients; < 8 in other patients Reticulocytes < 80K when Hgb < 9  Lab Results  Component Value Date   WBC 5.2 06/12/2021   HGB 5.7 (LL) 06/12/2021   HCT 15.4 (L) 06/12/2021   MCV 102.7 (H) 06/12/2021   PLT 266 06/12/2021   Currently holding hydroxyurea for Hgb < 6 per policy. Please reorder when/if appropriate. Thank you.   Ulice Dash D  06/12/21 12:56 PM

## 2021-06-12 NOTE — Progress Notes (Signed)
CRITICAL VALUE STICKER  CRITICAL VALUE: Hgb 5.7  RECEIVER (on-site recipient of call): P. Karmen Altamirano  DATE & TIME NOTIFIED: 06/12/21  2751  MESSENGER (representative from lab): [place]  MD NOTIFIED: Clarene Essex, NP  TIME OF NOTIFICATION: 7001  RESPONSE:  See new orders     06/12/21 0424  Vitals  Temp 99.8 F (37.7 C)  Temp Source Oral  BP 97/63  MAP (mmHg) 73  BP Location Right Arm  BP Method Automatic  Patient Position (if appropriate) Lying  Pulse Rate 73  Pulse Rate Source Monitor  Resp 14  MEWS COLOR  MEWS Score Color Green  Oxygen Therapy  SpO2 99 %  O2 Device Nasal Cannula  O2 Flow Rate (L/min) 2 L/min  MEWS Score  MEWS Temp 0  MEWS Systolic 1  MEWS Pulse 0  MEWS RR 0  MEWS LOC 0  MEWS Score 1  Provider Notification  Provider Name/Title Clarene Essex  Date Provider Notified 06/12/21  Time Provider Notified [phone]  Notification Type Page (Secure Chat)  Notification Reason Critical result  Test performed and critical result Hgb 5.7  Date Critical Result Received 06/12/21  Time Critical Result Received 0614  Provider response See new orders  Date of Provider Response 06/12/21  Time of Provider Response [phone]

## 2021-06-12 NOTE — Progress Notes (Signed)
   06/12/21 0002  Assess: MEWS Score  Temp (!) 103.1 F (39.5 C)  BP (!) 95/46  Pulse Rate 98  Resp 14  Level of Consciousness Alert  SpO2 94 %  O2 Device Room Air  Patient Activity (if Appropriate) In bed  O2 Flow Rate (L/min) 0 L/min  Assess: MEWS Score  MEWS Temp 2  MEWS Systolic 1  MEWS Pulse 0  MEWS RR 0  MEWS LOC 0  MEWS Score 3  MEWS Score Color Yellow  Assess: if the MEWS score is Yellow or Red  Were vital signs taken at a resting state? Yes  Focused Assessment Change from prior assessment (see assessment flowsheet)  Does the patient meet 2 or more of the SIRS criteria? Yes  Does the patient have a confirmed or suspected source of infection? Yes  Provider and Rapid Response Notified? Yes  MEWS guidelines implemented *See Row Information* Yes  Treat  MEWS Interventions Administered prn meds/treatments  Pain Scale 0-10  Pain Score 6  Pain Type Acute pain  Pain Location Generalized  Pain Frequency Constant  Pain Onset On-going  Patients Stated Pain Goal 4  Pain Intervention(s) Cold applied;Emotional support;Environmental changes  Take Vital Signs  Increase Vital Sign Frequency  Yellow: Q 2hr X 2 then Q 4hr X 2, if remains yellow, continue Q 4hrs  Escalate  MEWS: Escalate Yellow: discuss with charge nurse/RN and consider discussing with provider and RRT  Notify: Charge Nurse/RN  Name of Charge Nurse/RN Notified Estill Bamberg RN  Date Charge Nurse/RN Notified 06/12/21  Time Charge Nurse/RN Notified 0015  Notify: Provider  Provider Name/Title Clarene Essex  Date Provider Notified 06/12/21  Time Provider Notified 0012  Notification Type Page  Notification Reason Change in status  Provider response See new orders  Date of Provider Response 06/12/21  Time of Provider Response 0019  Notify: Rapid Response  Name of Rapid Response RN Notified Mandy RN  Date Rapid Response Notified 06/12/21  Time Rapid Response Notified 0013  Document  Progress note created (see row info)  Yes  Assess: SIRS CRITERIA  SIRS Temperature  1  SIRS Pulse 1  SIRS Respirations  0  SIRS WBC 0  SIRS Score Sum  2   Blankets removed from patient, ice given. Orders put in by Clarene Essex, NP. Will monitor VS more frequently. Rapid response made aware.

## 2021-06-12 NOTE — Progress Notes (Signed)
PROGRESS NOTE    Sandra Mckay  NAT:[id number] DOB: 05/23/1996 DOA: 06/10/2021 PCP: Vevelyn Francois, NP    Brief Narrative:  Sandra Mckay is a 25 year old female with past medical history significant for sickle cell disease, chronic anemia, bipolar disorder, history of cocaine abuse who presented to [facility] ED on 11/8 with complaints of body aches and fever.  Patient denies any sick contacts.  Reports pain is "all over".  Denies chest pain, no shortness of breath, no nausea/vomiting/diarrhea.  At baseline, patient takes OxyContin 10 mg every 12 hours and oxycodone 50 mg every 4 hours as needed.  In the ED, temperature 9 9.0 F, HR 103, RR 16, BP 111/66, SPO2 97% on room air; but dropped to 80% and was placed on 2 L nasal cannula.  Sodium 132, potassium 3.5, chloride 104, CO2 20, glucose 96, BUN less than 5, creatinine 0.47, AST 37, ALT 28, WBC 10.4, hemoglobin 7.8, platelets 449.  MCV 109.1.  Reticulocyte count 22.3.  hCG negative.  COVID-19 PCR negative.  Influenza A PCR positive.  Chest x-ray with no acute cardiopulmonary disease process.  Patient was started on Tamiflu given several doses of IV Dilaudid by EDP.  Hospital service consulted for further management of sickle cell pain crisis and influenza A.     Assessment & Plan:   Active Problems:   Sickle cell pain crisis (HCC)   Bipolar and related disorder ([place])   Sickle cell crisis ([place])   Chronic anemia   Influenza A   Sickle cell pain crisis Etiology likely triggered by influenza viral infection.  Follows with sickle cell clinic outpatient, Dionisio David, NP.  On OxyContin 10 mg p.o. twice daily and oxycodone 5 mg p.o. every 4 hours as needed at home. --Hydroxyurea 1500 mg p.o. daily --OxyContin 10 mg p.o. twice daily --Oxycodone 5mg  PO q4h PRN moderate pain --Dilaudid 0.5mg  IV q4h PRN severe pain --0.45% NS at 166mL/hr  Influenza A --Oseltamivir 75mg  PO BID x 5 days --Titrate supplemental oxygen, maintain SPO2 greater  than 92%  Hyponatremia Sodium 132 on ED presentation.  On half NS at 182mL/hr to avoid hypernatremia/dehydration in the setting of sickle cell disease as above. --Na 132>135 --BMP in a.m   Transaminitis Elevated bilirubin Etiology likely secondary to acute sickle cell crisis as above. --AST 37>113 --ALT 28>83 --Tbili 2.8>7.0 --Repeat LFTs in a.m.  Chronic anemia, macrocytic Hemoglobin 7.8>5.7 this am, 2u pRBC ordered overnight by coverage provider --Continue folic acid --Repeat CBC in the a.m.  Hx substance abuse History of cocaine, THC abuse.  UDS 10/15/2020 positive for cocaine, THC.   DVT prophylaxis: enoxaparin (LOVENOX) injection 40 mg Start: 06/11/21 1000   Code Status: Full Code Family Communication: Updated patient's mother present at bedside yesterday, no family present at bedside this morning.  Disposition Plan:  Level of care: Telemetry Status is: Inpatient  Remains inpatient appropriate because: Continued sickle cell pain crisis requiring IV Dilaudid, continues on IV fluid hydration, hemoglobin dropped to 5.7 requiring blood transfusion.  Anticipate discharge back home when hemoglobin stable, pain controlled.   Consultants:  None  Procedures:  None  Antimicrobials:  Oseltamivir 11/8>>   Subjective: Patient seen examined at bedside, resting comfortably.  Sleeping but easily arousable.  Pain better controlled, discussed will transition off of Dilaudid PCA today.  No family present at bedside.  Hemoglobin dropped overnight to 5.7, 2 unit PRBCs ordered by coverage provider overnight.  No other complaints or concerns at this time. Denies headache, no visual changes, no chest pain,  no palpitations, no shortness of breath, no abdominal pain.  No acute events overnight per nursing staff.  Objective: Vitals:   06/12/21 0002 06/12/21 0202 06/12/21 0424 06/12/21 0818  BP: (!) 95/46 (!) 95/56 97/63   Pulse: 98 78 73   Resp: 14 14 14 14   Temp: (!) 103.1 F (39.5  C) 100.1 F (37.8 C) 99.8 F (37.7 C)   TempSrc: Oral Oral Oral   SpO2: 94% 95% 99% 99%    Intake/Output Summary (Last 24 hours) at 06/12/2021 1219 Last data filed at 06/12/2021 0900 Gross per 24 hour  Intake 2740 ml  Output 1100 ml  Net 1640 ml   There were no vitals filed for this visit.  Examination:  General exam: Appears calm and comfortable  Respiratory system: Clear to auscultation. Respiratory effort normal.  On 2 L nasal cannula with SPO2 99% at rest. Cardiovascular system: S1 & S2 heard, RRR. No JVD, murmurs, rubs, gallops or clicks. No pedal edema. Gastrointestinal system: Abdomen is nondistended, soft and nontender. No organomegaly or masses felt. Normal bowel sounds heard. Central nervous system: Alert and oriented. No focal neurological deficits. Extremities: Symmetric 5 x 5 power. Skin: No rashes, lesions or ulcers Psychiatry: Judgement and insight appear normal. Mood & affect appropriate.     Data Reviewed: I have personally reviewed following labs and imaging studies  CBC: Recent Labs  Lab 06/10/21 2045 06/12/21 0419  WBC 10.4 5.2  NEUTROABS 7.1  --   HGB 7.8* 5.7*  HCT 21.6* 15.4*  MCV 109.1* 102.7*  PLT 449* 938   Basic Metabolic Panel: Recent Labs  Lab 06/10/21 2045 06/12/21 0419  NA 132* 135  K 3.5 3.5  CL 104 104  CO2 20* 22  GLUCOSE 96 88  BUN <5* 8  CREATININE 0.47 0.39*  CALCIUM 9.2 8.6*  MG  --  2.1   GFR: CrCl cannot be calculated (Unknown ideal weight.). Liver Function Tests: Recent Labs  Lab 06/10/21 2045 06/12/21 0419  AST 37 113*  ALT 28 83*  ALKPHOS 74 143*  BILITOT 2.8* 7.0*  PROT 7.5 6.4*  ALBUMIN 4.7 3.8   No results for input(s): LIPASE, AMYLASE in the last 168 hours. No results for input(s): AMMONIA in the last 168 hours. Coagulation Profile: No results for input(s): INR, PROTIME in the last 168 hours. Cardiac Enzymes: No results for input(s): CKTOTAL, CKMB, CKMBINDEX, TROPONINI in the last 168  hours. BNP (last 3 results) No results for input(s): PROBNP in the last 8760 hours. HbA1C: No results for input(s): HGBA1C in the last 72 hours. CBG: No results for input(s): GLUCAP in the last 168 hours. Lipid Profile: No results for input(s): CHOL, HDL, LDLCALC, TRIG, CHOLHDL, LDLDIRECT in the last 72 hours. Thyroid Function Tests: No results for input(s): TSH, T4TOTAL, FREET4, T3FREE, THYROIDAB in the last 72 hours. Anemia Panel: Recent Labs    06/10/21 2045  RETICCTPCT 22.3*   Sepsis Labs: No results for input(s): PROCALCITON, LATICACIDVEN in the last 168 hours.  Recent Results (from the past 240 hour(s))  Resp Panel by RT-PCR (Flu A&B, Covid) Nasopharyngeal Swab     Status: Abnormal   Collection Time: 06/10/21  8:46 PM   Specimen: Nasopharyngeal Swab; Nasopharyngeal(NP) swabs in vial transport medium  Result Value Ref Range Status   SARS Coronavirus 2 by RT PCR NEGATIVE NEGATIVE Final    Comment: (NOTE) SARS-CoV-2 target nucleic acids are NOT DETECTED.  The SARS-CoV-2 RNA is generally detectable in upper respiratory specimens during the acute phase  of infection. The lowest concentration of SARS-CoV-2 viral copies this assay can detect is 138 copies/mL. A negative result does not preclude SARS-Cov-2 infection and should not be used as the sole basis for treatment or other patient management decisions. A negative result may occur with  improper specimen collection/handling, submission of specimen other than nasopharyngeal swab, presence of viral mutation(s) within the areas targeted by this assay, and inadequate number of viral copies(<138 copies/mL). A negative result must be combined with clinical observations, patient history, and epidemiological information. The expected result is Negative.  Fact Sheet for Patients:  EntrepreneurPulse.com.au  Fact Sheet for Healthcare Providers:  IncredibleEmployment.be  This test is no t yet  approved or cleared by the [place] FDA and  has been authorized for detection and/or diagnosis of SARS-CoV-2 by FDA under an Emergency Use Authorization (EUA). This EUA will remain  in effect (meaning this test can be used) for the duration of the COVID-19 declaration under Section 564(b)(1) of the Act, 21 U.S.C.section 360bbb-3(b)(1), unless the authorization is terminated  or revoked sooner.       Influenza A by PCR POSITIVE (A) NEGATIVE Final   Influenza B by PCR NEGATIVE NEGATIVE Final    Comment: (NOTE) The Xpert Xpress SARS-CoV-2/FLU/RSV plus assay is intended as an aid in the diagnosis of influenza from Nasopharyngeal swab specimens and should not be used as a sole basis for treatment. Nasal washings and aspirates are unacceptable for Xpert Xpress SARS-CoV-2/FLU/RSV testing.  Fact Sheet for Patients: EntrepreneurPulse.com.au  Fact Sheet for Healthcare Providers: IncredibleEmployment.be  This test is not yet approved or cleared by the [place] FDA and has been authorized for detection and/or diagnosis of SARS-CoV-2 by FDA under an Emergency Use Authorization (EUA). This EUA will remain in effect (meaning this test can be used) for the duration of the COVID-19 declaration under Section 564(b)(1) of the Act, 21 U.S.C. section 360bbb-3(b)(1), unless the authorization is terminated or revoked.  Performed at [facility], [place] [address], [place], [place] [address]          Radiology Studies: [facility] Chest Port 1 View  Result Date: 06/10/2021 CLINICAL DATA:  Fever. EXAM: PORTABLE CHEST 1 VIEW COMPARISON:  Chest radiograph dated 12/08/2016. FINDINGS: No focal consolidation, pleural effusion or pneumothorax. Top-normal cardiac size. No acute osseous pathology. IMPRESSION: No acute cardiopulmonary process. Electronically Signed   By: Anner Crete M.D.   On: 06/10/2021 21:24        Scheduled Meds:  sodium  chloride   Intravenous Once   enoxaparin (LOVENOX) injection  40 mg Subcutaneous Q73A   folic acid  1 mg Oral Daily   ketorolac  15 mg Intravenous Q6H   oseltamivir  75 mg Oral BID   oxyCODONE  10 mg Oral Q12H   QUEtiapine  25 mg Oral QHS   senna-docusate  1 tablet Oral BID   sertraline  50 mg Oral Daily   Continuous Infusions:  sodium chloride 100 mL/hr at 06/12/21 0600   sodium chloride       LOS: 1 day    Time spent: 38 minutes spent on chart review, discussion with nursing staff, consultants, updating family and interview/physical exam; more than 50% of that time was spent in counseling and/or coordination of care.    Reid Regas J [place] DO [facility] Available via Epic secure chat 7am-7pm After these hours, please refer to coverage provider listed on [email or web address] 06/12/2021, 12:19 PM

## 2021-06-13 ENCOUNTER — Other Ambulatory Visit (HOSPITAL_COMMUNITY): Payer: Self-pay

## 2021-06-13 DIAGNOSIS — J101 Influenza due to other identified influenza virus with other respiratory manifestations: Secondary | ICD-10-CM | POA: Diagnosis not present

## 2021-06-13 DIAGNOSIS — D649 Anemia, unspecified: Secondary | ICD-10-CM | POA: Diagnosis not present

## 2021-06-13 DIAGNOSIS — F319 Bipolar disorder, unspecified: Secondary | ICD-10-CM | POA: Diagnosis not present

## 2021-06-13 DIAGNOSIS — D5700 Hb-SS disease with crisis, unspecified: Secondary | ICD-10-CM | POA: Diagnosis not present

## 2021-06-13 LAB — BPAM RBC
Blood Product Expiration Date: 202212012359
Blood Product Expiration Date: 202212082359
ISSUE DATE / TIME: 202211101223
ISSUE DATE / TIME: 202211101726
Unit Type and Rh: 9500
Unit Type and Rh: 9500

## 2021-06-13 LAB — BASIC METABOLIC PANEL
Anion gap: 7 (ref 5–15)
BUN: 5 mg/dL — ABNORMAL LOW (ref 6–20)
CO2: 24 mmol/L (ref 22–32)
Calcium: 8.7 mg/dL — ABNORMAL LOW (ref 8.9–10.3)
Chloride: 107 mmol/L (ref 98–111)
Creatinine, Ser: <0.3 mg/dL — ABNORMAL LOW (ref 0.44–1.00)
Glucose, Bld: 107 mg/dL — ABNORMAL HIGH (ref 70–99)
Potassium: 3.3 mmol/L — ABNORMAL LOW (ref 3.5–5.1)
Sodium: 138 mmol/L (ref 135–145)

## 2021-06-13 LAB — HEPATIC FUNCTION PANEL
ALT: 68 U/L — ABNORMAL HIGH (ref 0–44)
AST: 93 U/L — ABNORMAL HIGH (ref 15–41)
Albumin: 4 g/dL (ref 3.5–5.0)
Alkaline Phosphatase: 128 U/L — ABNORMAL HIGH (ref 38–126)
Bilirubin, Direct: 1.2 mg/dL — ABNORMAL HIGH (ref 0.0–0.2)
Indirect Bilirubin: 2.4 mg/dL — ABNORMAL HIGH (ref 0.3–0.9)
Total Bilirubin: 3.6 mg/dL — ABNORMAL HIGH (ref 0.3–1.2)
Total Protein: 6.6 g/dL (ref 6.5–8.1)

## 2021-06-13 LAB — CBC
HCT: 24.6 % — ABNORMAL LOW (ref 36.0–46.0)
Hemoglobin: 9 g/dL — ABNORMAL LOW (ref 12.0–15.0)
MCH: 33.8 pg (ref 26.0–34.0)
MCHC: 36.6 g/dL — ABNORMAL HIGH (ref 30.0–36.0)
MCV: 92.5 fL (ref 80.0–100.0)
Platelets: 271 10*3/uL (ref 150–400)
RBC: 2.66 MIL/uL — ABNORMAL LOW (ref 3.87–5.11)
RDW: 20.2 % — ABNORMAL HIGH (ref 11.5–15.5)
WBC: 5.7 10*3/uL (ref 4.0–10.5)
nRBC: 1.6 % — ABNORMAL HIGH (ref 0.0–0.2)

## 2021-06-13 LAB — TYPE AND SCREEN
ABO/RH(D): A POS
Antibody Screen: NEGATIVE
Donor AG Type: NEGATIVE
Donor AG Type: NEGATIVE
Unit division: 0
Unit division: 0

## 2021-06-13 LAB — RETICULOCYTES
Immature Retic Fract: 17.6 % — ABNORMAL HIGH (ref 2.3–15.9)
RBC.: 2.64 MIL/uL — ABNORMAL LOW (ref 3.87–5.11)
Retic Count, Absolute: 315.5 10*3/uL — ABNORMAL HIGH (ref 19.0–186.0)
Retic Ct Pct: 12.6 % — ABNORMAL HIGH (ref 0.4–3.1)

## 2021-06-13 MED ORDER — OSELTAMIVIR PHOSPHATE 75 MG PO CAPS
75.0000 mg | ORAL_CAPSULE | Freq: Two times a day (BID) | ORAL | 0 refills | Status: AC
  Filled 2021-06-13: qty 4, 2d supply, fill #0

## 2021-06-13 NOTE — Progress Notes (Signed)
Patient was given discharge instructions, and all questions were answered. Patient was stable for discharge and was walked to the main exit. 

## 2021-06-13 NOTE — Discharge Summary (Signed)
Physician Discharge Summary  Marcina Kinnison ZDG:[id number] DOB: January 26, 1996 DOA: 06/10/2021  PCP: Vevelyn Francois, NP  Admit date: 06/10/2021 Discharge date: 06/13/2021  Admitted From: Home Disposition: Home  Recommendations for Outpatient Follow-up:  Follow up with PCP in 1-2 weeks Continue Tamiflu to complete 5-day course for influenza A Please obtain CBC//CMP in one week to assess hemoglobin level  Home Health: No Equipment/Devices: None  Discharge Condition: Stable CODE STATUS: Full code Diet recommendation: Regular diet  History of present illness:  Sandra Mckay is a 25 year old female with past medical history significant for sickle cell disease, chronic anemia, bipolar disorder, history of cocaine abuse who presented to [facility] ED on 11/8 with complaints of body aches and fever.  Patient denies any sick contacts.  Reports pain is "all over".  Denies chest pain, no shortness of breath, no nausea/vomiting/diarrhea.   At baseline, patient takes OxyContin 10 mg every 12 hours and oxycodone 50 mg every 4 hours as needed.   In the ED, temperature 9 9.0 F, HR 103, RR 16, BP 111/66, SPO2 97% on room air; but dropped to 80% and was placed on 2 L nasal cannula.  Sodium 132, potassium 3.5, chloride 104, CO2 20, glucose 96, BUN less than 5, creatinine 0.47, AST 37, ALT 28, WBC 10.4, hemoglobin 7.8, platelets 449.  MCV 109.1.  Reticulocyte count 22.3.  hCG negative.  COVID-19 PCR negative.  Influenza A PCR positive.  Chest x-ray with no acute cardiopulmonary disease process.  Patient was started on Tamiflu given several doses of IV Dilaudid by EDP.  Hospital service consulted for further management of sickle cell pain crisis and influenza A.    Hospital course:  Sickle cell pain crisis Etiology likely triggered by influenza viral infection.  Follows with sickle cell clinic outpatient, Dionisio David, NP.  On OxyContin 10 mg p.o. twice daily and oxycodone 5 mg p.o. every 4 hours as needed at  home.  Patient was supported with IV fluid hydration, IV pain medications with improvement of her symptoms.  We will resume OxyContin 10 mg p.o. twice daily, oxycodone 50 mg p.o. every 4 hours as needed and hydroxyurea on discharge.  Follow-up with sickle cell clinic on discharge.   Influenza A Initially requiring supplemental oxygen, now titrated off.  Continue Oseltamivir 75mg  PO BID to complete 5-day course.    Hyponatremia Sodium 132 on ED presentation.  Supported with IV fluids as above.  Sodium improved to 138 at time of discharge.   Transaminitis Elevated bilirubin Etiology likely secondary to acute sickle cell crisis as above.  Bilirubin peaked at 7.0 and trended down to 2.4 at time of discharge.  Recommend LFTs 1 week.   Chronic anemia, macrocytic Hemoglobin trended down from 7.8-5.7 and received 2 unit PRBCs during hospitalization.  Hemoglobin 9.0 at time of discharge.  Continue folic acid.  Repeat CBC 1 week.    Hx substance abuse History of cocaine, THC abuse.  UDS 10/15/2020 positive for cocaine, THC.  Counseled on need for cessation.  Discharge Diagnoses:  Active Problems:   Sickle cell pain crisis ([place])   Bipolar and related disorder ([place])   Sickle cell crisis ([place])   Chronic anemia   Influenza A    Discharge Instructions  Discharge Instructions     Call MD for:  difficulty breathing, headache or visual disturbances   Complete by: As directed    Call MD for:  extreme fatigue   Complete by: As directed    Call MD for:  persistant dizziness  or light-headedness   Complete by: As directed    Call MD for:  persistant nausea and vomiting   Complete by: As directed    Call MD for:  severe uncontrolled pain   Complete by: As directed    Call MD for:  temperature >100.4   Complete by: As directed    Diet - low sodium heart healthy   Complete by: As directed    Increase activity slowly   Complete by: As directed       Allergies as of 06/13/2021       Reactions    Latex Rash        Medication List     TAKE these medications    folic acid 1 MG tablet Commonly known as: FOLVITE Take 1 tablet (1 mg total) by mouth daily.   hydroxyurea 500 MG capsule Commonly known as: HYDREA TAKE 3 CAPSULES (1,500 MG TOTAL) BY MOUTH DAILY. What changed:  how much to take how to take this when to take this additional instructions   ibuprofen 800 MG tablet Commonly known as: ADVIL Take 1 tablet (800 mg total) by mouth every 8 (eight) hours as needed. What changed: reasons to take this   ondansetron 4 MG tablet Commonly known as: Zofran Take 1 tablet (4 mg total) by mouth every 8 (eight) hours as needed for nausea or vomiting.   oseltamivir 75 MG capsule Commonly known as: TAMIFLU Take 1 capsule (75 mg total) by mouth 2 (two) times daily for 2 days.   oxyCODONE 15 MG immediate release tablet Commonly known as: ROXICODONE Take 1 tablet (15 mg total) by mouth every 4 (four) hours as needed for up to 15 days for pain. What changed: Another medication with the same name was removed. Continue taking this medication, and follow the directions you see here.   QUEtiapine 25 MG tablet Commonly known as: SEROquel Take 1 tablet (25 mg total) by mouth at bedtime.   sertraline 50 MG tablet Commonly known as: ZOLOFT TAKE 1 TABLET BY MOUTH EVERY DAY   tiZANidine 4 MG tablet Commonly known as: ZANAFLEX TAKE 1 TABLET BY MOUTH THREE TIMES A DAY        Follow-up Information     Vevelyn Francois, NP. Schedule an appointment as soon as possible for a visit in 1 week(s).   Specialty: Adult Health Nurse Practitioner Contact information: [address] [place] [address] [phone]                Allergies  Allergen Reactions   Latex Rash    Consultations: None   Procedures/Studies: DG Chest Port 1 View  Result Date: 06/10/2021 CLINICAL DATA:  Fever. EXAM: PORTABLE CHEST 1 VIEW COMPARISON:  Chest radiograph dated 12/08/2016.  FINDINGS: No focal consolidation, pleural effusion or pneumothorax. Top-normal cardiac size. No acute osseous pathology. IMPRESSION: No acute cardiopulmonary process. Electronically Signed   By: Anner Crete M.D.   On: 06/10/2021 21:24     Subjective: Patient seen examined at bedside, resting comfortably.  No complaints this morning.  Pain much improved.  Ready for discharge home.  Denies headache, no fever/chills/night sweats, no nausea/vomiting/diarrhea, no chest pain, palpitations, no shortness of breath, no abdominal pain, no weakness, no fatigue, no paresthesias.  No acute events overnight per nursing staff.  Discharge Exam: Vitals:   06/13/21 0120 06/13/21 0716  BP: 109/62 108/73  Pulse: 72 61  Resp: 16 16  Temp:    SpO2: 97% 94%  Vitals:   06/12/21 2107 06/12/21 2143 06/13/21 0120 06/13/21 0716  BP: (!) 109/57 116/72 109/62 108/73  Pulse: 73 78 72 61  Resp: 16 18 16 16   Temp: 99.5 F (37.5 C) 100.3 F (37.9 C)    TempSrc: Oral Oral    SpO2: 95% 95% 97% 94%    General: Pt is alert, awake, not in acute distress Cardiovascular: RRR, S1/S2 +, no rubs, no gallops Respiratory: CTA bilaterally, no wheezing, no rhonchi, on room air Abdominal: Soft, NT, ND, bowel sounds + Extremities: no edema, no cyanosis    The results of significant diagnostics from this hospitalization (including imaging, microbiology, ancillary and laboratory) are listed below for reference.     Microbiology: Recent Results (from the past 240 hour(s))  Resp Panel by RT-PCR (Flu A&B, Covid) Nasopharyngeal Swab     Status: Abnormal   Collection Time: 06/10/21  8:46 PM   Specimen: Nasopharyngeal Swab; Nasopharyngeal(NP) swabs in vial transport medium  Result Value Ref Range Status   SARS Coronavirus 2 by RT PCR NEGATIVE NEGATIVE Final    Comment: (NOTE) SARS-CoV-2 target nucleic acids are NOT DETECTED.  The SARS-CoV-2 RNA is generally detectable in upper respiratory specimens during the acute  phase of infection. The lowest concentration of SARS-CoV-2 viral copies this assay can detect is 138 copies/mL. A negative result does not preclude SARS-Cov-2 infection and should not be used as the sole basis for treatment or other patient management decisions. A negative result may occur with  improper specimen collection/handling, submission of specimen other than nasopharyngeal swab, presence of viral mutation(s) within the areas targeted by this assay, and inadequate number of viral copies(<138 copies/mL). A negative result must be combined with clinical observations, patient history, and epidemiological information. The expected result is Negative.  Fact Sheet for Patients:  EntrepreneurPulse.com.au  Fact Sheet for Healthcare Providers:  IncredibleEmployment.be  This test is no t yet approved or cleared by the [place] FDA and  has been authorized for detection and/or diagnosis of SARS-CoV-2 by FDA under an Emergency Use Authorization (EUA). This EUA will remain  in effect (meaning this test can be used) for the duration of the COVID-19 declaration under Section 564(b)(1) of the Act, 21 U.S.C.section 360bbb-3(b)(1), unless the authorization is terminated  or revoked sooner.       Influenza A by PCR POSITIVE (A) NEGATIVE Final   Influenza B by PCR NEGATIVE NEGATIVE Final    Comment: (NOTE) The Xpert Xpress SARS-CoV-2/FLU/RSV plus assay is intended as an aid in the diagnosis of influenza from Nasopharyngeal swab specimens and should not be used as a sole basis for treatment. Nasal washings and aspirates are unacceptable for Xpert Xpress SARS-CoV-2/FLU/RSV testing.  Fact Sheet for Patients: EntrepreneurPulse.com.au  Fact Sheet for Healthcare Providers: IncredibleEmployment.be  This test is not yet approved or cleared by the [place] FDA and has been authorized for detection and/or diagnosis  of SARS-CoV-2 by FDA under an Emergency Use Authorization (EUA). This EUA will remain in effect (meaning this test can be used) for the duration of the COVID-19 declaration under Section 564(b)(1) of the Act, 21 U.S.C. section 360bbb-3(b)(1), unless the authorization is terminated or revoked.  Performed at [facility], [place] [address] [place], [place] [address]      Labs: BNP (last 3 results) No results for input(s): BNP in the last 8760 hours. Basic Metabolic Panel: Recent Labs  Lab 06/10/21 2045 06/12/21 0419 06/13/21 0414  NA 132* 135 138  K 3.5 3.5 3.3*  CL 104 104 107  CO2 20* 22 24  GLUCOSE 96 88 107*  BUN <5* 8 5*  CREATININE 0.47 0.39* <0.30*  CALCIUM 9.2 8.6* 8.7*  MG  --  2.1  --    Liver Function Tests: Recent Labs  Lab 06/10/21 2045 06/12/21 0419 06/13/21 0414  AST 37 113* 93*  ALT 28 83* 68*  ALKPHOS 74 143* 128*  BILITOT 2.8* 7.0* 3.6*  PROT 7.5 6.4* 6.6  ALBUMIN 4.7 3.8 4.0   No results for input(s): LIPASE, AMYLASE in the last 168 hours. No results for input(s): AMMONIA in the last 168 hours. CBC: Recent Labs  Lab 06/10/21 2045 06/12/21 0419 06/13/21 0414  WBC 10.4 5.2 5.7  NEUTROABS 7.1  --   --   HGB 7.8* 5.7* 9.0*  HCT 21.6* 15.4* 24.6*  MCV 109.1* 102.7* 92.5  PLT 449* 266 271   Cardiac Enzymes: No results for input(s): CKTOTAL, CKMB, CKMBINDEX, TROPONINI in the last 168 hours. BNP: Invalid input(s): POCBNP CBG: No results for input(s): GLUCAP in the last 168 hours. D-Dimer No results for input(s): DDIMER in the last 72 hours. Hgb A1c No results for input(s): HGBA1C in the last 72 hours. Lipid Profile No results for input(s): CHOL, HDL, LDLCALC, TRIG, CHOLHDL, LDLDIRECT in the last 72 hours. Thyroid function studies No results for input(s): TSH, T4TOTAL, T3FREE, THYROIDAB in the last 72 hours.  Invalid input(s): FREET3 Anemia work up Recent Labs    06/10/21 2045 06/13/21 0414  RETICCTPCT 22.3*  12.6*   Urinalysis    Component Value Date/Time   COLORURINE YELLOW 12/05/2016 [place] 12/05/2016 0337   LABSPEC 1.015 08/04/2017 1144   PHURINE 7.0 08/04/2017 1144   GLUCOSEU NEGATIVE 08/04/2017 1144   HGBUR NEGATIVE 08/04/2017 1144   BILIRUBINUR neg 08/06/2020 1525   KETONESUR negative 09/27/2018 1014   KETONESUR NEGATIVE 08/04/2017 1144   PROTEINUR Negative 08/06/2020 1525   PROTEINUR NEGATIVE 08/04/2017 1144   UROBILINOGEN 0.2 08/06/2020 1525   UROBILINOGEN >=8.0 08/04/2017 1144   NITRITE neg 08/06/2020 1525   NITRITE NEGATIVE 08/04/2017 1144   LEUKOCYTESUR Negative 08/06/2020 1525   Sepsis Labs Invalid input(s): PROCALCITONIN,  WBC,  LACTICIDVEN Microbiology Recent Results (from the past 240 hour(s))  Resp Panel by RT-PCR (Flu A&B, Covid) Nasopharyngeal Swab     Status: Abnormal   Collection Time: 06/10/21  8:46 PM   Specimen: Nasopharyngeal Swab; Nasopharyngeal(NP) swabs in vial transport medium  Result Value Ref Range Status   SARS Coronavirus 2 by RT PCR NEGATIVE NEGATIVE Final    Comment: (NOTE) SARS-CoV-2 target nucleic acids are NOT DETECTED.  The SARS-CoV-2 RNA is generally detectable in upper respiratory specimens during the acute phase of infection. The lowest concentration of SARS-CoV-2 viral copies this assay can detect is 138 copies/mL. A negative result does not preclude SARS-Cov-2 infection and should not be used as the sole basis for treatment or other patient management decisions. A negative result may occur with  improper specimen collection/handling, submission of specimen other than nasopharyngeal swab, presence of viral mutation(s) within the areas targeted by this assay, and inadequate number of viral copies(<138 copies/mL). A negative result must be combined with clinical observations, patient history, and epidemiological information. The expected result is Negative.  Fact Sheet for Patients:   EntrepreneurPulse.com.au  Fact Sheet for Healthcare Providers:  IncredibleEmployment.be  This test is no t yet approved or cleared by the [place] FDA and  has been authorized for detection and/or diagnosis of SARS-CoV-2 by  FDA under an Emergency Use Authorization (EUA). This EUA will remain  in effect (meaning this test can be used) for the duration of the COVID-19 declaration under Section 564(b)(1) of the Act, 21 U.S.C.section 360bbb-3(b)(1), unless the authorization is terminated  or revoked sooner.       Influenza A by PCR POSITIVE (A) NEGATIVE Final   Influenza B by PCR NEGATIVE NEGATIVE Final    Comment: (NOTE) The Xpert Xpress SARS-CoV-2/FLU/RSV plus assay is intended as an aid in the diagnosis of influenza from Nasopharyngeal swab specimens and should not be used as a sole basis for treatment. Nasal washings and aspirates are unacceptable for Xpert Xpress SARS-CoV-2/FLU/RSV testing.  Fact Sheet for Patients: EntrepreneurPulse.com.au  Fact Sheet for Healthcare Providers: IncredibleEmployment.be  This test is not yet approved or cleared by the [place] FDA and has been authorized for detection and/or diagnosis of SARS-CoV-2 by FDA under an Emergency Use Authorization (EUA). This EUA will remain in effect (meaning this test can be used) for the duration of the COVID-19 declaration under Section 564(b)(1) of the Act, 21 U.S.C. section 360bbb-3(b)(1), unless the authorization is terminated or revoked.  Performed at [facility], [place] [address] [place], [place] [address]      Time coordinating discharge: Over 30 minutes  SIGNED:   Jadakiss Barish J [place] DO  [facility] 06/13/2021, 9:32 AM

## 2021-06-16 ENCOUNTER — Encounter: Payer: Self-pay | Admitting: Nurse Practitioner

## 2021-06-16 ENCOUNTER — Ambulatory Visit (INDEPENDENT_AMBULATORY_CARE_PROVIDER_SITE_OTHER): Payer: Medicaid Other | Admitting: Nurse Practitioner

## 2021-06-16 ENCOUNTER — Other Ambulatory Visit: Payer: Self-pay

## 2021-06-16 VITALS — BP 114/75 | HR 75 | Temp 98.3°F | Ht 63.0 in | Wt 122.4 lb

## 2021-06-16 DIAGNOSIS — D571 Sickle-cell disease without crisis: Secondary | ICD-10-CM | POA: Diagnosis not present

## 2021-06-16 DIAGNOSIS — F319 Bipolar disorder, unspecified: Secondary | ICD-10-CM

## 2021-06-16 DIAGNOSIS — F419 Anxiety disorder, unspecified: Secondary | ICD-10-CM

## 2021-06-16 DIAGNOSIS — S32009A Unspecified fracture of unspecified lumbar vertebra, initial encounter for closed fracture: Secondary | ICD-10-CM | POA: Insufficient documentation

## 2021-06-16 DIAGNOSIS — F32A Depression, unspecified: Secondary | ICD-10-CM

## 2021-06-16 DIAGNOSIS — Z5320 Procedure and treatment not carried out because of patient's decision for unspecified reasons: Secondary | ICD-10-CM

## 2021-06-16 DIAGNOSIS — Z79891 Long term (current) use of opiate analgesic: Secondary | ICD-10-CM

## 2021-06-16 DIAGNOSIS — G894 Chronic pain syndrome: Secondary | ICD-10-CM

## 2021-06-16 MED ORDER — OXYCODONE HCL ER 10 MG PO T12A: 10.0000 mg | EXTENDED_RELEASE_TABLET | Freq: Two times a day (BID) | ORAL | 0 refills | Status: DC

## 2021-06-16 MED ORDER — TIZANIDINE HCL 4 MG PO TABS
4.0000 mg | ORAL_TABLET | Freq: Three times a day (TID) | ORAL | 11 refills | Status: AC
Start: 2021-06-16 — End: 2022-06-16

## 2021-06-16 MED ORDER — DULOXETINE HCL 20 MG PO CPEP: ORAL_CAPSULE | ORAL | 0 refills | Status: DC

## 2021-06-16 NOTE — Progress Notes (Signed)
[place], [place]  [address] Phone:  [phone]   Fax:  [phone]   Established Patient Office Visit  Subjective:  Patient ID: Sandra Mckay, female    DOB: September 06, 1995  Age: 25 y.o. MRN: [id number]  CC:  Chief Complaint  Patient presents with   Follow-up   Hospitalization Follow-up    Pt was admitted into the hospital x 1 week ago due to having the flu, low hemoglobin and had a blood transfusion.    HPI Sandra Mckay presents for follow up. 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   She was admitted on 06/10/2021 for crisis this was related exacerbated by influenza.  During her hospital course she was treated with IV pain medicine and hydration along with Tamiflu 75 mg twice daily.  She also received 2 units of packed red blood cells due to chronic anemia for hemoglobin 7.8 that dropped 5.7. She is still having some cough and congestion.  She reports that it has been hard because her family also tested positive for flu.  She reports that she tries to be careful.  She is concerned that she may have very been exposed to the flu to local establishment.   She feels like her long acting Oxycodone needs to be adjusted. She is wanting to see if this can be increased. She tries to stay out out of the hospital so that she can care for her daughter.  She reports that she does have female twin cousins ages 49 who have recently completed stem cell transplant to cure sickle cell disease.  She wonders if she is a candidate for this treatment option.  She reports she would like to live a life free of sickle cell.  She has a diagnosis of bipolar disorder and is  currently on Seroquel 25 mg daily.  She reports that this is very effective.  She is no longer taking the sertraline 50 mg this was not effective.  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   TONSILLECTOMY  2012    Family History  Problem Relation Age of Onset   Arthritis Other        grandparent   Stroke Other    Hypertension Other    Diabetes Other        grandparent   Cancer - Other Other        Glioblastoma    Social History   Socioeconomic History   Marital status: Single    Spouse name: Not on file   Number of children: Not on file   Years of  education: Not on file   Highest education level: Not on file  Occupational History   Not on file  Tobacco Use   Smoking status: Some Days    Packs/day: 1.00    Types: Cigarettes   Smokeless tobacco: Never   Tobacco comments:    1 pack twice a week  Vaping Use   Vaping Use: Never used  Substance and Sexual Activity   Alcohol use: Yes    Alcohol/week: 0.0 standard drinks    Comment: occ   Drug use: No   Sexual activity: Yes  Other Topics Concern   Not on file  Social History Narrative   Lives with mom in a one story home.  Education: 2 years of college.    Social Determinants of Health   Financial Resource Strain: Not on file  Food Insecurity: Not on file  Transportation Needs: Not on file  Physical Activity: Not on file  Stress: Not on file  Social Connections: Not on file  Intimate  Partner Violence: Not on file    Outpatient Medications Prior to Visit  Medication Sig Dispense Refill   folic acid (FOLVITE) 1 MG tablet Take 1 tablet (1 mg total) by mouth daily. 90 tablet 3   hydroxyurea (HYDREA) 500 MG capsule TAKE 3 CAPSULES (1,500 MG TOTAL) BY MOUTH DAILY. (Patient taking differently: Take 1,500 mg by mouth daily.) 90 capsule 6   ibuprofen (ADVIL) 800 MG tablet Take 1 tablet (800 mg total) by mouth every 8 (eight) hours as needed. (Patient taking differently: Take 800 mg by mouth every 8 (eight) hours as needed for mild pain.) 60 tablet 6   ondansetron (ZOFRAN) 4 MG tablet Take 1 tablet (4 mg total) by mouth every 8 (eight) hours as needed for nausea or vomiting. 20 tablet 5   QUEtiapine (SEROQUEL) 25 MG tablet Take 1 tablet (25 mg total) by mouth at bedtime. 90 tablet 3   oxyCODONE (ROXICODONE) 15 MG immediate release tablet Take 1 tablet (15 mg total) by mouth every 4 (four) hours as needed for up to 15 days for pain. 90 tablet 0   tiZANidine (ZANAFLEX) 4 MG tablet TAKE 1 TABLET BY MOUTH THREE TIMES A DAY (Patient taking differently: Take 4 mg by mouth 3 (three) times daily.) 270 tablet 1   sertraline (ZOLOFT) 50 MG tablet TAKE 1 TABLET BY MOUTH EVERY DAY (Patient not taking: Reported on 06/16/2021) 90 tablet 4   No facility-administered medications prior to visit.    Allergies  Allergen Reactions   Latex Rash   Wound Dressing Adhesive Rash    ROS Review of Systems    Objective:    Physical Exam Constitutional:      Appearance: She is normal weight.  HENT:     Head: Normocephalic and atraumatic.  Eyes:     General: Scleral icterus present.  Cardiovascular:     Rate and Rhythm: Normal rate and regular rhythm.     Pulses: Normal pulses.     Heart sounds: Normal heart sounds.  Pulmonary:     Effort: Pulmonary effort is normal.     Comments: Coarseness to left lower lobe Musculoskeletal:        General: Normal range of motion.     Cervical back:  Normal range of motion.  Skin:    General: Skin is warm and dry.  Neurological:     Mental Status: She is alert.    BP 114/75   Pulse 75   Temp 98.3 F (36.8 C)  Ht 5\' 3"  (1.6 m)   Wt 122 lb 6.4 oz (55.5 kg)   SpO2 97%   BMI 21.68 kg/m  Wt Readings from Last 3 Encounters:  06/16/21 122 lb 6.4 oz (55.5 kg)  05/20/21 120 lb (54.4 kg)  02/28/21 119 lb 0.8 oz (54 kg)     Health Maintenance Due  Topic Date Due   PAP SMEAR-Modifier  09/01/2017    There are no preventive care reminders to display for this patient.  No results found for: TSH Lab Results  Component Value Date   WBC 5.7 06/13/2021   HGB 9.0 (L) 06/13/2021   HCT 24.6 (L) 06/13/2021   MCV 92.5 06/13/2021   PLT 271 06/13/2021   Lab Results  Component Value Date   NA 138 06/13/2021   K 3.3 (L) 06/13/2021   CO2 24 06/13/2021   GLUCOSE 107 (H) 06/13/2021   BUN 5 (L) 06/13/2021   CREATININE <0.30 (L) 06/13/2021   BILITOT 3.6 (H) 06/13/2021   ALKPHOS 128 (H) 06/13/2021   AST 93 (H) 06/13/2021   ALT 68 (H) 06/13/2021   PROT 6.6 06/13/2021   ALBUMIN 4.0 06/13/2021   CALCIUM 8.7 (L) 06/13/2021   ANIONGAP 7 06/13/2021   No results found for: CHOL No results found for: HDL No results found for: LDLCALC No results found for: TRIG No results found for: CHOLHDL No results found for: HGBA1C    Assessment & Plan:   Problem List Items Addressed This Visit       Other   Bipolar and related disorder ([place]) Stable We will continue with sertraline 25 mg daily Referral has been placed recent weeks for psychiatrist   Hb-SS disease without crisis ([place]) - Primary Started duloxetine 30 mg daily for up to 3 days and may increase duloxetine 30 mg 2 tablets daily to assist with chronic pain Ensure adequate hydration. Move frequently to reduce venous thromboembolism risk. Avoid situations that could lead to dehydration or could exacerbate pain Discussed S&S of infection, seizures, stroke acute chest, DVT and how  important it is to seek medical attention Take medication as directed along with pain contract and overall compliance Discussed the risk related to opiate use (addition, tolerance and dependency)    Relevant Medications   oxyCODONE (OXYCONTIN) 10 mg 12 hr tablet   Other Relevant Orders   POCT URINALYSIS DIP (CLINITEK)   CBC with Differential/Platelet (Completed)   Comp. Metabolic Panel (12) (Completed)   Other Visit Diagnoses     Anxiety     Persistent Duloxetine may be effective in assisting with anxiety and depression   Relevant Medications   DULoxetine (CYMBALTA) 20 MG capsule   Depression, unspecified depression type       Relevant Medications   DULoxetine (CYMBALTA) 20 MG capsule   Chronic prescription opiate use       Relevant Medications   oxyCODONE (OXYCONTIN) 10 mg 12 hr tablet   Chronic pain syndrome    Persistent with increased flareups   Relevant Medications   tiZANidine (ZANAFLEX) 4 MG tablet   DULoxetine (CYMBALTA) 20 MG capsule   oxyCODONE (OXYCONTIN) 10 mg 12 hr tablet   Screening chest x-ray declined           Meds ordered this encounter  Medications   tiZANidine (ZANAFLEX) 4 MG tablet    Sig: Take 1 tablet (4 mg total) by mouth 3 (three) times daily.    Dispense:  90 tablet    Refill:  11   DULoxetine (CYMBALTA)  20 MG capsule    Sig: Take 1 capsule (20 mg total) by mouth daily for 5 days, THEN 2 capsules (40 mg total) daily.    Dispense:  65 capsule    Refill:  0    Order Specific Question:   Supervising Provider    Answer:   Tresa Garter [[id number]]   oxyCODONE (OXYCONTIN) 10 mg 12 hr tablet    Sig: Take 1 tablet (10 mg total) by mouth every 12 (twelve) hours.    Dispense:  60 tablet    Refill:  0    The refill date is 04/13/2021. Thank you.    Order Specific Question:   Supervising Provider    Answer:   Tresa Garter [[id number]]     Follow-up: Return in about 4 weeks (around 07/14/2021) for virtual thanks.    Vevelyn Francois,  NP

## 2021-06-16 NOTE — Patient Instructions (Signed)
Sickle Cell Anemia, Adult Sickle cell anemia is a condition where your red blood cells are shaped like sickles. Red blood cells carry oxygen through the body. Sickle-shaped cells do not live as long as normal red blood cells. They also clump together and block blood from flowing through the blood vessels. This prevents the body from getting enough oxygen. Sickle cell anemia causes organ damage and pain. It also increases the risk of infection. Follow these instructions at home: Medicines Take over-the-counter and prescription medicines only as told by your doctor. If you were prescribed an antibiotic medicine, take it as told by your doctor. Do not stop taking the antibiotic even if you start to feel better. If you develop a fever, do not take medicines to lower the fever right away. Tell your doctor about the fever. Managing pain, stiffness, and swelling Try these methods to help with pain: Use a heating pad. Take a warm bath. Distract yourself, such as by watching TV. Eating and drinking Drink enough fluid to keep your pee (urine) clear or pale yellow. Drink more in hot weather and during exercise. Limit or avoid alcohol. Eat a healthy diet. Eat plenty of fruits, vegetables, whole grains, and lean protein. Take vitamins and supplements as told by your doctor. Traveling When traveling, keep these with you: Your medical information. The names of your doctors. Your medicines. If you need to take an airplane, talk to your doctor first. Activity Rest often. Avoid exercises that make your heart beat much faster, such as jogging. General instructions Do not use products that have nicotine or tobacco, such as cigarettes and e-cigarettes. If you need help quitting, ask your doctor. Consider wearing a medical alert bracelet. Avoid being in high places (high altitudes), such as mountains. Avoid very hot or cold temperatures. Avoid places where the temperature changes a lot. Keep all follow-up  visits as told by your doctor. This is important. Contact a doctor if: A joint hurts. Your feet or hands hurt or swell. You feel tired (fatigued). Get help right away if: You have symptoms of infection. These include: Fever. Chills. Being very tired. Irritability. Poor eating. Throwing up (vomiting). You feel dizzy or faint. You have new stomach pain, especially on the left side. You have a an erection (priapism) that lasts more than 4 hours. You have numbness in your arms or legs. You have a hard time moving your arms or legs. You have trouble talking. You have pain that does not go away when you take medicine. You are short of breath. You are breathing fast. You have a long-term cough. You have pain in your chest. You have a bad headache. You have a stiff neck. Your stomach looks bloated even though you did not eat much. Your skin is pale. You suddenly cannot see well. Summary Sickle cell anemia is a condition where your red blood cells are shaped like sickles. Follow your doctor's advice on ways to manage pain, food to eat, activities to do, and steps to take for safe travel. Get medical help right away if you have any signs of infection, such as a fever. This information is not intended to replace advice given to you by your health care provider. Make sure you discuss any questions you have with your health care provider. Document Revised: 12/14/2019 Document Reviewed: 12/14/2019 Elsevier Patient Education  [place].

## 2021-06-17 LAB — CBC WITH DIFFERENTIAL/PLATELET
Basophils Absolute: 0.1 10*3/uL (ref 0.0–0.2)
Basos: 1 %
EOS (ABSOLUTE): 0 10*3/uL (ref 0.0–0.4)
Eos: 0 %
Hematocrit: 28.4 % — ABNORMAL LOW (ref 34.0–46.6)
Hemoglobin: 9.9 g/dL — ABNORMAL LOW (ref 11.1–15.9)
Immature Grans (Abs): 0 10*3/uL (ref 0.0–0.1)
Immature Granulocytes: 0 %
Lymphocytes Absolute: 3.7 10*3/uL — ABNORMAL HIGH (ref 0.7–3.1)
Lymphs: 44 %
MCH: 33.4 pg — ABNORMAL HIGH (ref 26.6–33.0)
MCHC: 34.9 g/dL (ref 31.5–35.7)
MCV: 96 fL (ref 79–97)
Monocytes Absolute: 1.4 10*3/uL — ABNORMAL HIGH (ref 0.1–0.9)
Monocytes: 16 %
NRBC: 3 % — ABNORMAL HIGH (ref 0–0)
Neutrophils Absolute: 3.2 10*3/uL (ref 1.4–7.0)
Neutrophils: 39 %
Platelets: 391 10*3/uL (ref 150–450)
RBC: 2.96 x10E6/uL — ABNORMAL LOW (ref 3.77–5.28)
RDW: 19.4 % — ABNORMAL HIGH (ref 11.7–15.4)
WBC: 8.4 10*3/uL (ref 3.4–10.8)

## 2021-06-17 LAB — COMP. METABOLIC PANEL (12)
AST: 41 IU/L — ABNORMAL HIGH (ref 0–40)
Albumin/Globulin Ratio: 2.2 (ref 1.2–2.2)
Albumin: 4.8 g/dL (ref 3.9–5.0)
Alkaline Phosphatase: 147 IU/L — ABNORMAL HIGH (ref 44–121)
BUN/Creatinine Ratio: 14 (ref 9–23)
BUN: 6 mg/dL (ref 6–20)
Bilirubin Total: 2.8 mg/dL — ABNORMAL HIGH (ref 0.0–1.2)
Calcium: 9.3 mg/dL (ref 8.7–10.2)
Chloride: 105 mmol/L (ref 96–106)
Creatinine, Ser: 0.42 mg/dL — ABNORMAL LOW (ref 0.57–1.00)
Globulin, Total: 2.2 g/dL (ref 1.5–4.5)
Glucose: 96 mg/dL (ref 70–99)
Potassium: 4.2 mmol/L (ref 3.5–5.2)
Sodium: 140 mmol/L (ref 134–144)
Total Protein: 7 g/dL (ref 6.0–8.5)
eGFR: 137 mL/min/{1.73_m2} (ref >59)

## 2021-06-18 ENCOUNTER — Telehealth: Payer: Self-pay

## 2021-06-18 NOTE — Telephone Encounter (Signed)
Oxycodone  °

## 2021-06-19 ENCOUNTER — Encounter: Payer: Self-pay | Admitting: Nurse Practitioner

## 2021-06-19 ENCOUNTER — Other Ambulatory Visit: Payer: Self-pay | Admitting: Nurse Practitioner

## 2021-06-19 DIAGNOSIS — G894 Chronic pain syndrome: Secondary | ICD-10-CM

## 2021-06-19 DIAGNOSIS — Z79891 Long term (current) use of opiate analgesic: Secondary | ICD-10-CM

## 2021-06-19 DIAGNOSIS — D571 Sickle-cell disease without crisis: Secondary | ICD-10-CM

## 2021-06-19 MED ORDER — OXYCODONE HCL 15 MG PO TABS: 15.0000 mg | ORAL_TABLET | ORAL | 0 refills | Status: DC | PRN

## 2021-06-20 ENCOUNTER — Ambulatory Visit (INDEPENDENT_AMBULATORY_CARE_PROVIDER_SITE_OTHER): Payer: Medicaid Other | Admitting: Clinical

## 2021-06-20 DIAGNOSIS — F319 Bipolar disorder, unspecified: Secondary | ICD-10-CM

## 2021-06-20 DIAGNOSIS — F419 Anxiety disorder, unspecified: Secondary | ICD-10-CM
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
## 2021-06-23 ENCOUNTER — Other Ambulatory Visit (HOSPITAL_COMMUNITY): Payer: Self-pay

## 2021-07-01 ENCOUNTER — Ambulatory Visit (INDEPENDENT_AMBULATORY_CARE_PROVIDER_SITE_OTHER): Payer: Medicaid Other | Admitting: Clinical

## 2021-07-01 DIAGNOSIS — F319 Bipolar disorder, unspecified: Secondary | ICD-10-CM

## 2021-07-01 DIAGNOSIS — F419 Anxiety disorder, unspecified: Secondary | ICD-10-CM
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
## 2021-07-04 ENCOUNTER — Telehealth: Payer: Self-pay

## 2021-07-04 NOTE — Telephone Encounter (Signed)
Oxycodone  Oxycontin

## 2021-07-07 ENCOUNTER — Other Ambulatory Visit: Payer: Self-pay | Admitting: Nurse Practitioner

## 2021-07-07 DIAGNOSIS — D571 Sickle-cell disease without crisis: Secondary | ICD-10-CM

## 2021-07-07 DIAGNOSIS — Z79891 Long term (current) use of opiate analgesic: Secondary | ICD-10-CM

## 2021-07-07 DIAGNOSIS — G894 Chronic pain syndrome: Secondary | ICD-10-CM

## 2021-07-07 MED ORDER — OXYCODONE HCL 15 MG PO TABS: 15.0000 mg | ORAL_TABLET | ORAL | 0 refills | Status: DC | PRN

## 2021-07-08 ENCOUNTER — Telehealth: Payer: Self-pay

## 2021-07-08 ENCOUNTER — Other Ambulatory Visit: Payer: Self-pay | Admitting: Nurse Practitioner

## 2021-07-08 NOTE — Telephone Encounter (Signed)
Oxycodone  °

## 2021-07-10 NOTE — Telephone Encounter (Signed)
Spoke to the pharmacist and she stated that the patient was given 14 pills for the Oxycontin because the insurance would only pay for 7 days.

## 2021-07-11 ENCOUNTER — Other Ambulatory Visit: Payer: Self-pay | Admitting: Nurse Practitioner

## 2021-07-11 DIAGNOSIS — D571 Sickle-cell disease without crisis: Secondary | ICD-10-CM

## 2021-07-11 DIAGNOSIS — G894 Chronic pain syndrome: Secondary | ICD-10-CM

## 2021-07-11 DIAGNOSIS — Z79891 Long term (current) use of opiate analgesic: Secondary | ICD-10-CM

## 2021-07-11 MED ORDER — OXYCODONE HCL ER 10 MG PO T12A: 10.0000 mg | EXTENDED_RELEASE_TABLET | Freq: Two times a day (BID) | ORAL | 0 refills | Status: DC

## 2021-07-14 ENCOUNTER — Ambulatory Visit (INDEPENDENT_AMBULATORY_CARE_PROVIDER_SITE_OTHER): Payer: Medicaid Other | Admitting: Clinical

## 2021-07-14 ENCOUNTER — Other Ambulatory Visit: Payer: Self-pay

## 2021-07-14 DIAGNOSIS — F319 Bipolar disorder, unspecified: Secondary | ICD-10-CM

## 2021-07-14 DIAGNOSIS — F419 Anxiety disorder, unspecified: Secondary | ICD-10-CM
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
## 2021-07-16 ENCOUNTER — Other Ambulatory Visit: Payer: Self-pay | Admitting: Nurse Practitioner

## 2021-07-16 ENCOUNTER — Ambulatory Visit: Payer: Self-pay | Admitting: Nurse Practitioner

## 2021-07-16 ENCOUNTER — Telehealth: Payer: Self-pay | Admitting: Nurse Practitioner

## 2021-07-16 ENCOUNTER — Telehealth: Payer: Self-pay

## 2021-07-16 NOTE — Telephone Encounter (Signed)
Oxycodone  °

## 2021-07-16 NOTE — Telephone Encounter (Signed)
Patient requests refill on Oxycontin 10mg  @ [facility] [address] in [place]  She has a virtual visit follow up scheduled for 12/15

## 2021-07-17 ENCOUNTER — Other Ambulatory Visit: Payer: Self-pay

## 2021-07-17 ENCOUNTER — Telehealth (INDEPENDENT_AMBULATORY_CARE_PROVIDER_SITE_OTHER): Payer: Medicaid Other | Admitting: Nurse Practitioner

## 2021-07-17 DIAGNOSIS — Z91199 Patient's noncompliance with other medical treatment and regimen due to unspecified reason: Secondary | ICD-10-CM

## 2021-07-17 NOTE — Progress Notes (Signed)
° °  Sandra Mckay, [place]  [address] Phone:  [phone]   Fax:  [phone] Virtual Visit via Video Note  I was unable to connect with Sandra Mckay on 07/17/21 at  5:15 PM EST by video    Vevelyn Francois, NP

## 2021-07-22 ENCOUNTER — Telehealth: Payer: Self-pay

## 2021-07-22 NOTE — Telephone Encounter (Signed)
Oxycodone 15 mg 

## 2021-07-23 ENCOUNTER — Ambulatory Visit: Payer: Self-pay | Admitting: Nurse Practitioner

## 2021-07-23 ENCOUNTER — Telehealth: Payer: Self-pay | Admitting: Nurse Practitioner

## 2021-07-23 ENCOUNTER — Other Ambulatory Visit: Payer: Self-pay | Admitting: Nurse Practitioner

## 2021-07-23 DIAGNOSIS — G894 Chronic pain syndrome: Secondary | ICD-10-CM

## 2021-07-23 DIAGNOSIS — Z79891 Long term (current) use of opiate analgesic: Secondary | ICD-10-CM

## 2021-07-23 DIAGNOSIS — D571 Sickle-cell disease without crisis: Secondary | ICD-10-CM

## 2021-07-23 MED ORDER — OXYCODONE HCL 15 MG PO TABS: 15.0000 mg | ORAL_TABLET | ORAL | 0 refills | Status: DC | PRN

## 2021-07-25 ENCOUNTER — Other Ambulatory Visit: Payer: Self-pay

## 2021-07-25 ENCOUNTER — Encounter: Payer: Self-pay | Admitting: Nurse Practitioner

## 2021-07-25 ENCOUNTER — Ambulatory Visit (INDEPENDENT_AMBULATORY_CARE_PROVIDER_SITE_OTHER): Payer: Medicaid Other | Admitting: Nurse Practitioner

## 2021-07-25 VITALS — BP 111/76 | HR 74 | Temp 99.0°F | Wt 131.0 lb

## 2021-07-25 DIAGNOSIS — G894 Chronic pain syndrome: Secondary | ICD-10-CM | POA: Diagnosis not present

## 2021-07-25 DIAGNOSIS — D571 Sickle-cell disease without crisis: Secondary | ICD-10-CM | POA: Diagnosis not present

## 2021-07-25 DIAGNOSIS — Z79891 Long term (current) use of opiate analgesic: Secondary | ICD-10-CM

## 2021-07-25 DIAGNOSIS — G4700 Insomnia, unspecified: Secondary | ICD-10-CM

## 2021-07-25 MED ORDER — MELATONIN 10 MG PO CAPS: 20.0000 mg | ORAL_CAPSULE | Freq: Every evening | ORAL | 2 refills | Status: DC | PRN

## 2021-07-25 MED ORDER — OXYCODONE HCL ER 10 MG PO T12A: 10.0000 mg | EXTENDED_RELEASE_TABLET | Freq: Two times a day (BID) | ORAL | 0 refills | Status: AC

## 2021-07-25 NOTE — Progress Notes (Signed)
[employer] [place], [place]  [address] Phone:  [phone]   Fax:  [phone]  Virtual Visit via Video Note  I connected with [name] on 07/25/21 at 10:40 AM EST by video and verified that I am speaking with the correct person using two identifiers.   I discussed the limitations, risks, security and privacy concerns of performing an evaluation and management service by video and the availability of in person appointments. I also discussed with the patient that there may be a patient responsible charge related to this service. The patient expressed understanding and agreed to proceed.  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   HPI   She reports that she needs to follow up. She is only getting 4 hours of sleep. She does take a one hour nap during the day. She has tried to the OTC Unisom; not effective. She had been taking a benadryl but she wakes up. It takes her several hours to get to sleep. She feels the Seroquel and Oxycodone does not for sleep.   She does not feel like the Cymbalta was not effective. She does not feel like it did not help with the pain or depression.   She has insurance issue. She has been placed in a lock program. She was informed that her dose was too high with the Oxycodone ER and she would only get 7 days supply.  ROS    Observations/Objective: Virtual visit no exam   Assessment and Plan: 1. Hb-SS disease without crisis ([place]) - oxyCODONE (OXYCONTIN) 10 mg 12 hr tablet; Take 1 tablet (10 mg total) by mouth every 12 (twelve) hours for 7 days.  Dispense: 14 tablet; Refill:  0 - oxyCODONE (OXYCONTIN) 10 mg 12 hr tablet; Take 1 tablet (10 mg total) by mouth every 12 (twelve) hours for 7 days. Must last 30 days.  Dispense: 14 tablet; Refill: 0 Will follow up with insurance for clarity on the lockin program.   2. Chronic prescription opiate use - oxyCODONE (OXYCONTIN) 10 mg 12 hr tablet; Take 1 tablet (10 mg total) by mouth every 12 (twelve) hours for 7 days.  Dispense: 14 tablet; Refill: 0 - oxyCODONE (OXYCONTIN) 10 mg 12 hr tablet; Take 1 tablet (10 mg total) by mouth every 12 (twelve) hours for 7 days. Must last 30 days.  Dispense: 14 tablet; Refill: 0  3. Chronic pain syndrome - oxyCODONE (OXYCONTIN) 10 mg 12 hr tablet; Take 1 tablet (10 mg total) by mouth every 12 (twelve) hours for 7 days.  Dispense: 14 tablet; Refill: 0 - oxyCODONE (OXYCONTIN) 10 mg 12 hr tablet; Take 1 tablet (10 mg total) by mouth every 12 (twelve) hours for 7 days. Must last 30 days.  Dispense: 14 tablet; Refill: 0  4. Insomnia, unspecified type - Melatonin 10 MG CAPS; Take 20 mg by mouth at bedtime as needed.  Dispense: 60 capsule; Refill: 2    Follow Up Instructions: No follow-ups on file.    I discussed the assessment and treatment plan with the patient. The patient was provided an opportunity to ask questions and all were answered. The patient agreed with the plan and demonstrated an understanding of the instructions.   The  patient was advised to call back or seek an in-person evaluation if the symptoms worsen or if the condition fails to improve as anticipated.  I provided 10 minutes of video- visit time during this encounter.   Vevelyn Francois, NP

## 2021-08-05 ENCOUNTER — Telehealth: Payer: Self-pay

## 2021-08-05 NOTE — Telephone Encounter (Signed)
Oxycodone  °

## 2021-08-06 ENCOUNTER — Emergency Department (HOSPITAL_COMMUNITY): Payer: Medicaid Other

## 2021-08-06 ENCOUNTER — Encounter (HOSPITAL_COMMUNITY): Payer: Self-pay | Admitting: Emergency Medicine

## 2021-08-06 ENCOUNTER — Emergency Department (HOSPITAL_COMMUNITY)
Admission: EM | Admit: 2021-08-06 | Discharge: 2021-08-06 | Disposition: A | Discharge status: Home / Self Care | Payer: Medicaid Other | Attending: Emergency Medicine | Admitting: Emergency Medicine

## 2021-08-06 ENCOUNTER — Other Ambulatory Visit: Payer: Self-pay | Admitting: Nurse Practitioner

## 2021-08-06 DIAGNOSIS — Z8616 Personal history of COVID-19: Secondary | ICD-10-CM | POA: Insufficient documentation

## 2021-08-06 DIAGNOSIS — Z79891 Long term (current) use of opiate analgesic: Secondary | ICD-10-CM

## 2021-08-06 DIAGNOSIS — G894 Chronic pain syndrome: Secondary | ICD-10-CM

## 2021-08-06 DIAGNOSIS — D5700 Hb-SS disease with crisis, unspecified: Secondary | ICD-10-CM | POA: Insufficient documentation

## 2021-08-06 DIAGNOSIS — Z91040 Latex allergy status: Secondary | ICD-10-CM | POA: Insufficient documentation

## 2021-08-06 DIAGNOSIS — D571 Sickle-cell disease without crisis: Secondary | ICD-10-CM

## 2021-08-06 LAB — COMPREHENSIVE METABOLIC PANEL
ALT: 26 U/L (ref 0–44)
AST: 33 U/L (ref 15–41)
Albumin: 4.5 g/dL (ref 3.5–5.0)
Alkaline Phosphatase: 64 U/L (ref 38–126)
Anion gap: 8 (ref 5–15)
BUN: 11 mg/dL (ref 6–20)
CO2: 20 mmol/L — ABNORMAL LOW (ref 22–32)
Calcium: 9.1 mg/dL (ref 8.9–10.3)
Chloride: 109 mmol/L (ref 98–111)
Creatinine, Ser: 0.47 mg/dL (ref 0.44–1.00)
GFR, Estimated: >60 mL/min (ref >60)
Glucose, Bld: 89 mg/dL (ref 70–99)
Potassium: 3.7 mmol/L (ref 3.5–5.1)
Sodium: 137 mmol/L (ref 135–145)
Total Bilirubin: 1.7 mg/dL — ABNORMAL HIGH (ref 0.3–1.2)
Total Protein: 7.2 g/dL (ref 6.5–8.1)

## 2021-08-06 LAB — CBC WITH DIFFERENTIAL/PLATELET
Abs Immature Granulocytes: 0.04 10*3/uL (ref 0.00–0.07)
Basophils Absolute: 0.1 10*3/uL (ref 0.0–0.1)
Basophils Relative: 1 %
Eosinophils Absolute: 0 10*3/uL (ref 0.0–0.5)
Eosinophils Relative: 0 %
HCT: 22.9 % — ABNORMAL LOW (ref 36.0–46.0)
Hemoglobin: 8 g/dL — ABNORMAL LOW (ref 12.0–15.0)
Immature Granulocytes: 0 %
Lymphocytes Relative: 40 %
Lymphs Abs: 3.6 10*3/uL (ref 0.7–4.0)
MCH: 35.6 pg — ABNORMAL HIGH (ref 26.0–34.0)
MCHC: 34.9 g/dL (ref 30.0–36.0)
MCV: 101.8 fL — ABNORMAL HIGH (ref 80.0–100.0)
Monocytes Absolute: 1.2 10*3/uL — ABNORMAL HIGH (ref 0.1–1.0)
Monocytes Relative: 13 %
Neutro Abs: 4.2 10*3/uL (ref 1.7–7.7)
Neutrophils Relative %: 46 %
Platelets: 478 10*3/uL — ABNORMAL HIGH (ref 150–400)
RBC: 2.25 MIL/uL — ABNORMAL LOW (ref 3.87–5.11)
RDW: 22.3 % — ABNORMAL HIGH (ref 11.5–15.5)
WBC: 9.2 10*3/uL (ref 4.0–10.5)
nRBC: 1.1 % — ABNORMAL HIGH (ref 0.0–0.2)

## 2021-08-06 LAB — RETICULOCYTES
Immature Retic Fract: 34.3 % — ABNORMAL HIGH (ref 2.3–15.9)
RBC.: 2.25 MIL/uL — ABNORMAL LOW (ref 3.87–5.11)
Retic Count, Absolute: 501.5 10*3/uL — ABNORMAL HIGH (ref 19.0–186.0)
Retic Ct Pct: 21.8 % — ABNORMAL HIGH (ref 0.4–3.1)

## 2021-08-06 LAB — I-STAT BETA HCG BLOOD, ED (MC, WL, AP ONLY): I-stat hCG, quantitative: <5 m[IU]/mL (ref <5)

## 2021-08-06 MED ORDER — KETOROLAC TROMETHAMINE 15 MG/ML IJ SOLN
15.0000 mg | Freq: Once | INTRAMUSCULAR | Status: AC
  Administered 2021-08-06: 15 mg via INTRAVENOUS
  Filled 2021-08-06: qty 1

## 2021-08-06 MED ORDER — MORPHINE SULFATE 30 MG PO TABS
30.0000 mg | ORAL_TABLET | ORAL | Status: DC | PRN
  Administered 2021-08-06: 30 mg via ORAL
  Filled 2021-08-06: qty 1

## 2021-08-06 MED ORDER — OXYCODONE HCL 15 MG PO TABS: 15.0000 mg | ORAL_TABLET | ORAL | 0 refills | Status: DC | PRN

## 2021-08-06 MED ORDER — ONDANSETRON HCL 4 MG/2ML IJ SOLN
4.0000 mg | INTRAMUSCULAR | Status: DC | PRN
  Administered 2021-08-06: 4 mg via INTRAVENOUS
  Filled 2021-08-06: qty 2

## 2021-08-06 MED ORDER — HYDROMORPHONE HCL 2 MG/ML IJ SOLN
2.0000 mg | INTRAMUSCULAR | Status: AC
  Administered 2021-08-06: 2 mg via INTRAVENOUS
  Filled 2021-08-06: qty 1

## 2021-08-06 MED ORDER — DIPHENHYDRAMINE HCL 25 MG PO CAPS
25.0000 mg | ORAL_CAPSULE | ORAL | Status: DC | PRN
  Administered 2021-08-06: 25 mg via ORAL
  Filled 2021-08-06: qty 2

## 2021-08-06 NOTE — ED Triage Notes (Signed)
Patient complaining of B/L hip related to a sickle cell crisis-states she tested positive for covid last week she thinks-states she is not sure if she still has it

## 2021-08-06 NOTE — ED Notes (Addendum)
An After Visit Summary was printed and given to the patient. Discharge instructions given and no further questions at this time.  Pt states a friend is driving her home.

## 2021-08-06 NOTE — ED Provider Notes (Signed)
°  Provider Note MRN:  [id number]  Arrival date & time: 08/06/21    ED Course and Medical Decision Making  Assumed care from Dr Kathrynn Humble at shift change.  See not from prior team for complete details, in brief: Patient with sickle cell disease, sickle pain crisis, COVID-19. Labs reviewed and are stable.  Pain well controlled.  Symptoms overall well improved.  Chest x-ray is pending.  Plan per prior physician review chest x-ray, continue p.o. meds, reassess discharge  Chest x-ray completed, no evidence of acute chest crisis.  Pain greatly improved.  She reports feeling back to her baseline.  Per plan of prior physician will discharge home with close outpatient follow-up.  The patient improved significantly and was discharged in stable condition. Detailed discussions were had with the patient regarding current findings, and need for close f/u with PCP or on call doctor. The patient has been instructed to return immediately if the symptoms worsen in any way for re-evaluation. Patient verbalized understanding and is in agreement with current care plan. All questions answered prior to discharge.    Procedures  Final Clinical Impressions(s) / ED Diagnoses     ICD-10-CM   1. Sickle cell pain crisis ([place])  D57.00       ED Discharge Orders     None         Discharge Instructions      You are seen in the ER for sickle cell pain crisis.  We were able to get your symptoms in better control.  Ensure that you hydrate yourself well and continue to take your pain medication.  If your symptoms start getting worse, specifically if you start having some shortness of breath, severe chest pain, fevers -return to the ER immediately.       Jeanell Sparrow, DO 08/06/21 1646

## 2021-08-06 NOTE — Discharge Instructions (Signed)
You are seen in the ER for sickle cell pain crisis.  We were able to get your symptoms in better control.  Ensure that you hydrate yourself well and continue to take your pain medication.  If your symptoms start getting worse, specifically if you start having some shortness of breath, severe chest pain, fevers -return to the ER immediately.

## 2021-08-06 NOTE — Telephone Encounter (Signed)
The refill has been sent. Thanks

## 2021-08-06 NOTE — ED Provider Notes (Signed)
[facility] DEPT Provider Note   CSN: [id number] Arrival date & time: 08/06/21  2683     History  Chief Complaint  Patient presents with   Sickle Cell Pain Crisis    Sandra Mckay is a 26 y.o. female.  HPI     26 year old female with history of sickle cell anemia comes in with chief complaint of sickle cell pain.  She reports that she was diagnosed with COVID-19 on New Year's Eve.  Subsequently she has been having intermittent, waxing and waning pain.  The pain has become more persistent and severe, prompting her to come to the ER.  Pain is located diffusely, covering bilateral upper and lower extremities and joints and also her back.  She does not have any new chest pain.  Positive cough, but denies any shortness of breath.  Home Medications Prior to Admission medications   Medication Sig Start Date End Date Taking? Authorizing Provider  hydroxyurea (HYDREA) 500 MG capsule TAKE 3 CAPSULES (1,500 MG TOTAL) BY MOUTH DAILY. Patient taking differently: Take 1,500 mg by mouth daily. 08/10/19  Yes Azzie Glatter, FNP  ibuprofen (ADVIL) 800 MG tablet Take 1 tablet (800 mg total) by mouth every 8 (eight) hours as needed. Patient taking differently: Take 800 mg by mouth every 8 (eight) hours as needed for mild pain. 02/28/21  Yes Vevelyn Francois, NP  Melatonin 10 MG CAPS Take 20 mg by mouth at bedtime as needed. Patient taking differently: Take 10 mg by mouth at bedtime as needed (sleep). 07/25/21  Yes Vevelyn Francois, NP  oxyCODONE (ROXICODONE) 15 MG immediate release tablet Take 1 tablet (15 mg total) by mouth every 4 (four) hours as needed for up to 15 days for pain. 08/07/21 08/22/21 Yes Vevelyn Francois, NP  QUEtiapine (SEROQUEL) 25 MG tablet Take 1 tablet (25 mg total) by mouth at bedtime. 05/08/21 05/08/22 Yes King, Diona Foley, NP  tiZANidine (ZANAFLEX) 4 MG tablet Take 1 tablet (4 mg total) by mouth 3 (three) times daily. 06/16/21 06/16/22 Yes King, Diona Foley, NP  DULoxetine (CYMBALTA) 20 MG capsule Take 2 capsules (40 mg total) by mouth daily. Patient not taking: Reported on 07/25/2021 07/09/21 01/05/22  Vevelyn Francois, NP  folic acid (FOLVITE) 1 MG tablet Take 1 tablet (1 mg total) by mouth daily. Patient not taking: Reported on 08/06/2021 03/23/18   Azzie Glatter, FNP  ondansetron (ZOFRAN) 4 MG tablet Take 1 tablet (4 mg total) by mouth every 8 (eight) hours as needed for nausea or vomiting. Patient not taking: Reported on 08/06/2021 02/28/21   Vevelyn Francois, NP  oxyCODONE (OXYCONTIN) 10 mg 12 hr tablet Take 1 tablet (10 mg total) by mouth every 12 (twelve) hours for 7 days. Must last 30 days. Patient not taking: Reported on 08/06/2021 08/01/21 08/08/21  Vevelyn Francois, NP      Allergies    Latex, Other, and Wound dressing adhesive    Review of Systems   Review of Systems  Constitutional:  Positive for activity change.  Respiratory:  Positive for cough. Negative for shortness of breath.   Cardiovascular:  Negative for chest pain.  Musculoskeletal:  Positive for arthralgias and myalgias.   Physical Exam Updated Vital Signs BP 111/74 (BP Location: Right Arm)    Pulse 63    Temp 99.2 F (37.3 C) (Oral)    Resp 18    Wt 59 kg    SpO2 100%    BMI 23.03 kg/m  Physical Exam Vitals and nursing note reviewed.  Constitutional:      Appearance: She is well-developed.  HENT:     Head: Atraumatic.  Eyes:     General: No scleral icterus. Cardiovascular:     Rate and Rhythm: Normal rate.  Pulmonary:     Effort: Pulmonary effort is normal.  Musculoskeletal:     Cervical back: Normal range of motion and neck supple.  Skin:    General: Skin is warm and dry.  Neurological:     Mental Status: She is alert and oriented to person, place, and time.    ED Results / Procedures / Treatments   Labs (all labs ordered are listed, but only abnormal results are displayed) Labs Reviewed  COMPREHENSIVE METABOLIC PANEL - Abnormal; Notable for the  following components:      Result Value   CO2 20 (*)    Total Bilirubin 1.7 (*)    All other components within normal limits  CBC WITH DIFFERENTIAL/PLATELET - Abnormal; Notable for the following components:   RBC 2.25 (*)    Hemoglobin 8.0 (*)    HCT 22.9 (*)    MCV 101.8 (*)    MCH 35.6 (*)    RDW 22.3 (*)    Platelets 478 (*)    nRBC 1.1 (*)    Monocytes Absolute 1.2 (*)    All other components within normal limits  RETICULOCYTES - Abnormal; Notable for the following components:   Retic Ct Pct 21.8 (*)    RBC. 2.25 (*)    Retic Count, Absolute 501.5 (*)    Immature Retic Fract 34.3 (*)    All other components within normal limits  I-STAT BETA HCG BLOOD, ED ([facility], [facility], [facility] ONLY)    EKG None  Radiology No results found.  Procedures Procedures    Medications Ordered in ED Medications  diphenhydrAMINE (BENADRYL) capsule 25-50 mg (25 mg Oral Given 08/06/21 1445)  ondansetron (ZOFRAN) injection 4 mg (4 mg Intravenous Given 08/06/21 1445)  morphine (MSIR) tablet 30 mg (has no administration in time range)  ketorolac (TORADOL) 15 MG/ML injection 15 mg (has no administration in time range)  HYDROmorphone (DILAUDID) injection 2 mg (2 mg Intravenous Given 08/06/21 1445)  HYDROmorphone (DILAUDID) injection 2 mg (2 mg Intravenous Given 08/06/21 1527)    ED Course/ Medical Decision Making/ A&P                           Medical Decision Making  This patient presents to the ED with chief complaint(s) of diffuse pain secondary to sickle cell anemia. The complaint also carries with it a high risk of complications and morbidity.    The differential diagnosis includes acute chest syndrome, sickle cell pain crisis/ischemic pain, acute on chronic exacerbation of sickle cell pain.  Comorbidities that complicate the patient evaluation: COVID-19 Patients presentation is complicated by their history of recent diagnosis of COVID-19.   Additional history obtained: Records reviewed previous  admission documents  The initial plan is to get basic labs and reassess the patient. Although we considered acute chest syndrome in the differential diagnosis, patient denies any shortness of breath, chest pain and has no hypoxia.  She has COVID-19, which does increase her risk of going into acute chest syndrome.  However with no chest pain, no abnormal lung findings, no hypoxia, no shortness of breath -suspicion of her being an acute chest syndrome at this time is low.  Patient looks nontoxic, which is  also reassuring.  We will get a baseline chest x-ray.   Lab Tests: I Ordered, and personally interpreted labs.  The pertinent results include:    Imaging Studies ordered: I ordered imaging studies including X-ray of the chest   I independently visualized and interpreted imaging which showed no consolidation. I agree with the radiologist interpretation  Cardiac Monitoring: The patient was maintained on a cardiac monitor.  I personally viewed and interpreted the cardiac monitor which showed an underlying rhythm of:  sinus rhythm  Medicines ordered and prescription drug management: I ordered medications, including intravenous narcotic medicine for pain control. Reevaluation of the patient after these medicines showed that the patient    improved after the first dose.  After the second dose, she feels like she can go home.   Complexity of problems addressed: Patients presentation is most consistent with  exacerbation of chronic illness  Disposition: After consideration of the diagnostic results and the patients response to treatment,  I feel that the patent would benefit from discharge with strict ER return precautions .          Final Clinical Impression(s) / ED Diagnoses Final diagnoses:  Sickle cell pain crisis [facility])    Rx / DC Orders ED Discharge Orders     None         Varney Biles, MD 08/06/21 1552

## 2021-08-06 NOTE — ED Provider Triage Note (Signed)
Emergency Medicine Provider Triage Evaluation Note  Letecia Arps , a 26 y.o. female  was evaluated in triage.  Pt complains of sickle cell crisis pain.  States she was diagnosed with COVID last week and has been having sickle cell pain since then.  Has been managing with at home pain medication which she states is no longer sufficient.  Her pain is in her low back and bilateral hips and legs which is consistent with her normal sickle cell crisis pain.  She denies any chest pain.  Review of Systems  Positive:  Negative: See above  Physical Exam  BP 107/63 (BP Location: Left Arm)    Pulse 72    Temp 99.2 F (37.3 C) (Oral)    Resp 16    SpO2 95%  Gen:   Awake, no distress   Resp:  Normal effort  MSK:   Moves extremities without difficulty  Other:    Medical Decision Making  Medically screening exam initiated at 9:41 AM.  Appropriate orders placed.  Kamyla Olejnik was informed that the remainder of the evaluation will be completed by another provider, this initial triage assessment does not replace that evaluation, and the importance of remaining in the ED until their evaluation is complete.     Bud Face, PA-C 08/06/21 [phone]

## 2021-08-07 ENCOUNTER — Other Ambulatory Visit: Payer: Self-pay

## 2021-08-07 ENCOUNTER — Ambulatory Visit (INDEPENDENT_AMBULATORY_CARE_PROVIDER_SITE_OTHER): Payer: Medicaid Other | Admitting: Clinical

## 2021-08-07 ENCOUNTER — Encounter: Payer: Self-pay | Admitting: Clinical

## 2021-08-07 DIAGNOSIS — F319 Bipolar disorder, unspecified: Secondary | ICD-10-CM

## 2021-08-07 DIAGNOSIS — F419 Anxiety disorder, unspecified: Secondary | ICD-10-CM
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
## 2021-08-08 ENCOUNTER — Other Ambulatory Visit: Payer: Self-pay | Admitting: Nurse Practitioner

## 2021-08-18 ENCOUNTER — Ambulatory Visit (INDEPENDENT_AMBULATORY_CARE_PROVIDER_SITE_OTHER): Payer: Medicaid Other | Admitting: Clinical

## 2021-08-18 DIAGNOSIS — F319 Bipolar disorder, unspecified: Secondary | ICD-10-CM | POA: Diagnosis not present

## 2021-08-18 DIAGNOSIS — F419 Anxiety disorder, unspecified: Secondary | ICD-10-CM
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
## 2021-08-21 ENCOUNTER — Telehealth: Payer: Self-pay

## 2021-08-21 NOTE — Telephone Encounter (Signed)
Oxycodone  °

## 2021-08-22 ENCOUNTER — Other Ambulatory Visit: Payer: Self-pay | Admitting: Nurse Practitioner

## 2021-08-22 DIAGNOSIS — Z79891 Long term (current) use of opiate analgesic: Secondary | ICD-10-CM

## 2021-08-22 DIAGNOSIS — G894 Chronic pain syndrome: Secondary | ICD-10-CM

## 2021-08-22 DIAGNOSIS — D571 Sickle-cell disease without crisis: Secondary | ICD-10-CM

## 2021-08-22 MED ORDER — OXYCODONE HCL 15 MG PO TABS: 15.0000 mg | ORAL_TABLET | ORAL | 0 refills | Status: DC | PRN

## 2021-08-22 NOTE — Telephone Encounter (Signed)
The refill has been sent. Thanks

## 2021-08-25 ENCOUNTER — Ambulatory Visit (INDEPENDENT_AMBULATORY_CARE_PROVIDER_SITE_OTHER): Payer: Medicaid Other | Admitting: Clinical

## 2021-08-25 ENCOUNTER — Other Ambulatory Visit: Payer: Self-pay

## 2021-08-25 DIAGNOSIS — F319 Bipolar disorder, unspecified: Secondary | ICD-10-CM | POA: Diagnosis not present

## 2021-08-25 DIAGNOSIS — F419 Anxiety disorder, unspecified: Secondary | ICD-10-CM
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
## 2021-09-03 ENCOUNTER — Telehealth: Payer: Self-pay

## 2021-09-03 ENCOUNTER — Other Ambulatory Visit: Payer: Self-pay | Admitting: Nurse Practitioner

## 2021-09-03 DIAGNOSIS — Z79891 Long term (current) use of opiate analgesic: Secondary | ICD-10-CM

## 2021-09-03 DIAGNOSIS — D571 Sickle-cell disease without crisis: Secondary | ICD-10-CM

## 2021-09-03 DIAGNOSIS — G894 Chronic pain syndrome: Secondary | ICD-10-CM

## 2021-09-03 MED ORDER — OXYCODONE HCL 15 MG PO TABS: 15.0000 mg | ORAL_TABLET | ORAL | 0 refills | Status: DC | PRN

## 2021-09-03 NOTE — Telephone Encounter (Signed)
Oxycodone 15 mg 

## 2021-09-03 NOTE — Telephone Encounter (Signed)
The refill has been sent. Thanks

## 2021-09-05 ENCOUNTER — Ambulatory Visit (INDEPENDENT_AMBULATORY_CARE_PROVIDER_SITE_OTHER): Payer: Medicaid Other | Admitting: Clinical

## 2021-09-05 DIAGNOSIS — F419 Anxiety disorder, unspecified: Secondary | ICD-10-CM

## 2021-09-05 DIAGNOSIS — F319 Bipolar disorder, unspecified: Secondary | ICD-10-CM | POA: Diagnosis not present
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
## 2021-09-08 ENCOUNTER — Telehealth: Payer: Self-pay | Admitting: Clinical

## 2021-09-08 NOTE — Telephone Encounter (Signed)
[facility] attended [facility] sickle cell support group via virtual format.   Aziel Morgan, LCSW [facility] [phone] 

## 2021-09-17 ENCOUNTER — Telehealth: Payer: Self-pay

## 2021-09-17 NOTE — Telephone Encounter (Signed)
Oxycodone  °

## 2021-09-18 ENCOUNTER — Other Ambulatory Visit: Payer: Self-pay | Admitting: Nurse Practitioner

## 2021-09-18 DIAGNOSIS — Z79891 Long term (current) use of opiate analgesic: Secondary | ICD-10-CM

## 2021-09-18 DIAGNOSIS — G894 Chronic pain syndrome: Secondary | ICD-10-CM

## 2021-09-18 DIAGNOSIS — D571 Sickle-cell disease without crisis: Secondary | ICD-10-CM

## 2021-09-18 MED ORDER — OXYCODONE HCL 15 MG PO TABS: 15.0000 mg | ORAL_TABLET | ORAL | 0 refills | Status: DC | PRN

## 2021-09-18 NOTE — Telephone Encounter (Signed)
The refill has been sent. Thanks

## 2021-10-02 ENCOUNTER — Other Ambulatory Visit: Payer: Self-pay | Admitting: Nurse Practitioner

## 2021-10-02 ENCOUNTER — Telehealth: Payer: Self-pay

## 2021-10-02 DIAGNOSIS — G894 Chronic pain syndrome: Secondary | ICD-10-CM

## 2021-10-02 DIAGNOSIS — D571 Sickle-cell disease without crisis: Secondary | ICD-10-CM

## 2021-10-02 DIAGNOSIS — Z79891 Long term (current) use of opiate analgesic: Secondary | ICD-10-CM

## 2021-10-02 MED ORDER — OXYCODONE HCL 15 MG PO TABS: 15.0000 mg | ORAL_TABLET | ORAL | 0 refills | Status: DC | PRN

## 2021-10-02 NOTE — Telephone Encounter (Signed)
Oxycodone 15 mg 

## 2021-10-02 NOTE — Telephone Encounter (Signed)
The refill has been sent. Thanks  ? ?

## 2021-10-06 ENCOUNTER — Ambulatory Visit: Payer: Self-pay | Admitting: Clinical

## 2021-10-06 ENCOUNTER — Ambulatory Visit (INDEPENDENT_AMBULATORY_CARE_PROVIDER_SITE_OTHER): Payer: Medicaid Other | Admitting: Nurse Practitioner

## 2021-10-06 ENCOUNTER — Encounter: Payer: Self-pay | Admitting: Nurse Practitioner

## 2021-10-06 ENCOUNTER — Other Ambulatory Visit: Payer: Self-pay

## 2021-10-06 DIAGNOSIS — D571 Sickle-cell disease without crisis: Secondary | ICD-10-CM | POA: Diagnosis not present

## 2021-10-06 MED ORDER — HYDROXYUREA 500 MG PO CAPS: 1500.0000 mg | ORAL_CAPSULE | Freq: Every day | ORAL | 0 refills | Status: DC

## 2021-10-06 NOTE — Progress Notes (Signed)
? ?  [place] ?[place], [place]  [address] ?Phone:  [phone]   Fax:  [phone] ?Virtual Visit via Telephone Note ? ?I connected with Sandra Mckay on 10/06/21 at 10:20 AM EST by telephone and verified that I am speaking with the correct person using two identifiers. ?  ?I discussed the limitations, risks, security and privacy concerns of performing an evaluation and management service by telephone and the availability of in person appointments. I also discussed with the patient that there may be a patient responsible charge related to this service. The patient expressed understanding and agreed to proceed. ? ?Patient home ?Provider Office ? ?History of Present Illness: ? 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  ? ?She is concern that her pain is getting harder to control her pain at home. She has not had hydrea. She is wanting to get back on this due to her recurrent pain. She reports that her insurance in not covering her medication. She denies changing her insurance. She is the lock in program but this should not affect the cost or copay. She is having to pay $50.00 per refill. She feels like her tolerance is high. She does not feel like it is not as effective. She is not getting a lot of relief on the Oxycodone 15 mg #90; MMED 135.  ?Insurance completely stopped paying for the OxyContin out of pocket cost was #300.00.  ? ? ?ROS  ? ?Observations/Objective: ?No exam; telephone visit ? ?Assessment and Plan: ?1. Hb-SS disease without crisis ([place]) ?- hydroxyurea (HYDREA) 500 MG capsule; Take 3 capsules (1,500 mg total) by mouth daily. TAKE 3 CAPSULES (1,500 MG TOTAL) BY  MOUTH DAILY.  Dispense: 90 capsule; Refill: 0 ? ? ?Follow Up Instructions: ?follow up 6 weeks ? ?I discussed the assessment and treatment plan with the patient. The patient was provided an opportunity to ask questions and all were answered. The patient agreed with the plan and demonstrated an understanding of the instructions. ?  ?The patient was advised to call back or seek an in-person evaluation if the symptoms worsen or if the condition fails to improve as anticipated. ? ?I provided 10 minutes of telephone- visit time during this encounter. ? ? ?Vevelyn Francois, NP ?   ?

## 2021-10-10 ENCOUNTER — Ambulatory Visit (INDEPENDENT_AMBULATORY_CARE_PROVIDER_SITE_OTHER): Payer: Medicaid Other | Admitting: Clinical

## 2021-10-10 DIAGNOSIS — F319 Bipolar disorder, unspecified: Secondary | ICD-10-CM

## 2021-10-10 DIAGNOSIS — F419 Anxiety disorder, unspecified: Secondary | ICD-10-CM
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
## 2021-10-17 ENCOUNTER — Other Ambulatory Visit: Payer: Self-pay | Admitting: Nurse Practitioner

## 2021-10-17 ENCOUNTER — Telehealth: Payer: Self-pay

## 2021-10-17 ENCOUNTER — Telehealth: Payer: Self-pay | Admitting: Clinical

## 2021-10-17 DIAGNOSIS — G894 Chronic pain syndrome: Secondary | ICD-10-CM

## 2021-10-17 DIAGNOSIS — D571 Sickle-cell disease without crisis: Secondary | ICD-10-CM

## 2021-10-17 DIAGNOSIS — Z79891 Long term (current) use of opiate analgesic: Secondary | ICD-10-CM

## 2021-10-17 MED ORDER — OXYCODONE HCL 15 MG PO TABS: 15.0000 mg | ORAL_TABLET | ORAL | 0 refills | Status: DC | PRN

## 2021-10-17 NOTE — Telephone Encounter (Signed)
The refill has been sent. Thanks  ? ?

## 2021-10-17 NOTE — Telephone Encounter (Addendum)
Integrated Behavioral Health General Follow Up Note  10/17/2021 Name: Sandra Mckay MRN: [id number] DOB: 1996-02-26 Sandra Mckay is a 26 y.o. year old female who sees Vevelyn Francois, NP for primary care. LCSW was initially consulted to assist the patient with Mental Health Counseling and Resources.  Interpreter: No.   Interpreter Name & Language: none  Assessment: Patient experiencing bipolar disorder and anxiety. Patient also interested in parenting resources.  Ongoing Intervention: Today [name] called patient and provided information on a parenting program in [facility]. Patient had previously expressed interest in supportive resources for parenting. Reminded patient of upcoming [place] ([facility]) appointment next week.  Review of patient status, including review of consultants reports, relevant laboratory and other test results, and collaboration with appropriate care team members and the patient's provider was performed as part of comprehensive patient evaluation and provision of services.    [name], [facility] [phone]

## 2021-10-17 NOTE — Telephone Encounter (Signed)
Oxycodone 15 mg 

## 2021-10-20 ENCOUNTER — Other Ambulatory Visit: Payer: Self-pay | Admitting: Nurse Practitioner

## 2021-10-20 DIAGNOSIS — D571 Sickle-cell disease without crisis: Secondary | ICD-10-CM

## 2021-10-24 ENCOUNTER — Encounter: Payer: Self-pay | Admitting: Clinical

## 2021-10-27 ENCOUNTER — Encounter: Payer: Self-pay | Admitting: Clinical

## 2021-10-28 ENCOUNTER — Encounter (HOSPITAL_COMMUNITY): Payer: Self-pay

## 2021-10-28 ENCOUNTER — Other Ambulatory Visit: Payer: Self-pay

## 2021-10-28 ENCOUNTER — Inpatient Hospital Stay (HOSPITAL_COMMUNITY)
Admission: EM | Admit: 2021-10-28 | Discharge: 2021-10-30 | DRG: 812 | Disposition: A | Discharge status: Home / Self Care | Payer: Medicaid Other | Attending: Internal Medicine | Admitting: Internal Medicine

## 2021-10-28 DIAGNOSIS — F1210 Cannabis abuse, uncomplicated: Secondary | ICD-10-CM | POA: Diagnosis present

## 2021-10-28 DIAGNOSIS — Z79899 Other long term (current) drug therapy: Secondary | ICD-10-CM

## 2021-10-28 DIAGNOSIS — Z91040 Latex allergy status: Secondary | ICD-10-CM | POA: Diagnosis not present

## 2021-10-28 DIAGNOSIS — D649 Anemia, unspecified: Secondary | ICD-10-CM | POA: Diagnosis present

## 2021-10-28 DIAGNOSIS — F17210 Nicotine dependence, cigarettes, uncomplicated: Secondary | ICD-10-CM | POA: Diagnosis present

## 2021-10-28 DIAGNOSIS — F1120 Opioid dependence, uncomplicated: Secondary | ICD-10-CM | POA: Diagnosis present

## 2021-10-28 DIAGNOSIS — D72829 Elevated white blood cell count, unspecified: Secondary | ICD-10-CM | POA: Diagnosis present

## 2021-10-28 DIAGNOSIS — F419 Anxiety disorder, unspecified: Secondary | ICD-10-CM | POA: Diagnosis present

## 2021-10-28 DIAGNOSIS — I272 Pulmonary hypertension, unspecified: Secondary | ICD-10-CM | POA: Diagnosis present

## 2021-10-28 DIAGNOSIS — E876 Hypokalemia: Secondary | ICD-10-CM

## 2021-10-28 DIAGNOSIS — F32A Depression, unspecified: Secondary | ICD-10-CM | POA: Diagnosis present

## 2021-10-28 DIAGNOSIS — D638 Anemia in other chronic diseases classified elsewhere: Secondary | ICD-10-CM | POA: Diagnosis present

## 2021-10-28 DIAGNOSIS — D5700 Hb-SS disease with crisis, unspecified: Principal | ICD-10-CM | POA: Diagnosis present

## 2021-10-28 DIAGNOSIS — G894 Chronic pain syndrome: Secondary | ICD-10-CM | POA: Diagnosis present

## 2021-10-28 DIAGNOSIS — F1410 Cocaine abuse, uncomplicated: Secondary | ICD-10-CM | POA: Diagnosis present

## 2021-10-28 LAB — COMPREHENSIVE METABOLIC PANEL
ALT: 33 U/L (ref 0–44)
AST: 36 U/L (ref 15–41)
Albumin: 4.5 g/dL (ref 3.5–5.0)
Alkaline Phosphatase: 71 U/L (ref 38–126)
Anion gap: 5 (ref 5–15)
BUN: 9 mg/dL (ref 6–20)
CO2: 22 mmol/L (ref 22–32)
Calcium: 9.1 mg/dL (ref 8.9–10.3)
Chloride: 108 mmol/L (ref 98–111)
Creatinine, Ser: 0.52 mg/dL (ref 0.44–1.00)
GFR, Estimated: >60 mL/min (ref >60)
Glucose, Bld: 147 mg/dL — ABNORMAL HIGH (ref 70–99)
Potassium: 3.4 mmol/L — ABNORMAL LOW (ref 3.5–5.1)
Sodium: 135 mmol/L (ref 135–145)
Total Bilirubin: 2.6 mg/dL — ABNORMAL HIGH (ref 0.3–1.2)
Total Protein: 7.4 g/dL (ref 6.5–8.1)

## 2021-10-28 LAB — CBC WITH DIFFERENTIAL/PLATELET
Abs Immature Granulocytes: 0.1 10*3/uL — ABNORMAL HIGH (ref 0.00–0.07)
Basophils Absolute: 0.1 10*3/uL (ref 0.0–0.1)
Basophils Relative: 1 %
Eosinophils Absolute: 0.1 10*3/uL (ref 0.0–0.5)
Eosinophils Relative: 1 %
HCT: 19.3 % — ABNORMAL LOW (ref 36.0–46.0)
Hemoglobin: 7.2 g/dL — ABNORMAL LOW (ref 12.0–15.0)
Immature Granulocytes: 1 %
Lymphocytes Relative: 38 %
Lymphs Abs: 4.2 10*3/uL — ABNORMAL HIGH (ref 0.7–4.0)
MCH: 40.7 pg — ABNORMAL HIGH (ref 26.0–34.0)
MCHC: 37.3 g/dL — ABNORMAL HIGH (ref 30.0–36.0)
MCV: 109 fL — ABNORMAL HIGH (ref 80.0–100.0)
Monocytes Absolute: 1.7 10*3/uL — ABNORMAL HIGH (ref 0.1–1.0)
Monocytes Relative: 15 %
Neutro Abs: 4.9 10*3/uL (ref 1.7–7.7)
Neutrophils Relative %: 44 %
Platelets: 410 10*3/uL — ABNORMAL HIGH (ref 150–400)
RBC: 1.77 MIL/uL — ABNORMAL LOW (ref 3.87–5.11)
RDW: 19.8 % — ABNORMAL HIGH (ref 11.5–15.5)
WBC: 11 10*3/uL — ABNORMAL HIGH (ref 4.0–10.5)
nRBC: 1.7 % — ABNORMAL HIGH (ref 0.0–0.2)

## 2021-10-28 LAB — RETICULOCYTES
Immature Retic Fract: 33.6 % — ABNORMAL HIGH (ref 2.3–15.9)
RBC.: 1.78 MIL/uL — ABNORMAL LOW (ref 3.87–5.11)
Retic Count, Absolute: 497.5 10*3/uL — ABNORMAL HIGH (ref 19.0–186.0)
Retic Ct Pct: 25.5 % — ABNORMAL HIGH (ref 0.4–3.1)

## 2021-10-28 MED ORDER — SODIUM CHLORIDE 0.45 % IV SOLN: INTRAVENOUS | Status: DC

## 2021-10-28 MED ORDER — HYDROMORPHONE HCL 2 MG/ML IJ SOLN
2.0000 mg | INTRAMUSCULAR | Status: AC
  Administered 2021-10-28: 2 mg via INTRAVENOUS
  Filled 2021-10-28: qty 1

## 2021-10-28 MED ORDER — KETOROLAC TROMETHAMINE 15 MG/ML IJ SOLN
15.0000 mg | INTRAMUSCULAR | Status: AC
  Administered 2021-10-28: 15 mg via INTRAVENOUS
  Filled 2021-10-28: qty 1

## 2021-10-28 MED ORDER — DIPHENHYDRAMINE HCL 50 MG/ML IJ SOLN
25.0000 mg | Freq: Once | INTRAMUSCULAR | Status: AC
  Administered 2021-10-28: 25 mg via INTRAVENOUS
  Filled 2021-10-28: qty 1

## 2021-10-28 NOTE — ED Notes (Signed)
ED TO INPATIENT HANDOFF REPORT ? ?ED Nurse Name and Phone #: [phone] Erick Colace, RN  ? ?S ?Name/Age/Gender ?Sandra Mckay ?26 y.o. ?female ?Room/Bed: BD53/GD92 ? ?Code Status ?  Code Status: Prior ? ?Home/SNF/Other ?Home ?Patient oriented to: self, place, time, and situation ?Is this baseline? Yes  ? ?Triage Complete: Triage complete  ?Chief Complaint ?Sickle cell pain crisis ([place]) [D57.00] ? ?Triage Note ?Reports SCC pain worst in lower back hips, and knees x 2 days. Last dose of oxycodone '15mg'$  around noon with no improvement.   ? ?Allergies ?Allergies  ?Allergen Reactions  ? Latex Rash  ? Other Rash  ?  Latex Tape  ? Wound Dressing Adhesive Rash  ? ? ?Level of Care/Admitting Diagnosis ?ED Disposition   ? ? ED Disposition  ?Admit  ? Condition  ?--  ? Comment  ?Hospital Area: [facility] [[id number]] ? Level of Care: Med-Surg [16] ? May admit patient to [facility] or [facility] if equivalent level of care is available:: No ? Covid Evaluation: Asymptomatic - no recent exposure (last 10 days) testing not required ? Diagnosis: Sickle cell pain crisis [facility]) [[id number]] ? Admitting Physician: [name] [phone] ? Attending Physician: [name] [phone] ? Estimated length of stay: past midnight tomorrow ? Certification:: I certify this patient will need inpatient services for at least 2 midnights ?  ?  ? ?  ? ? ?B ?Medical/Surgery History ?Past Medical History:  ?Diagnosis Date  ? Blood transfusion without reported diagnosis   ? Bronchitis   ? Chickenpox   ? Depression   ? Elevated ferritin level 05/2019  ? Heart murmur   ? Pulmonary hypertension ([place])   ? Sickle cell anemia ([facility])   ? 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 Thrombocytosis 05/2019  ? Urinary tract infection   ? Vitamin D deficiency   ? ?Past Surgical History:  ?Procedure Laterality Date  ? CHOLECYSTECTOMY  2011  ? EYE SURGERY    ? Sty removal  ? [place]  ? 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 TONSILLECTOMY  2012  ?  ? ?A ?IV Location/Drains/Wounds ?Patient Lines/Drains/Airways Status   ? ? Active Line/Drains/Airways   ? ? Name Placement date Placement time Site Days  ? Peripheral IV 10/28/21 20 G Anterior;Left Forearm 10/28/21  2200  Forearm  less than 1  ? ?  ?  ? ?  ? ? ?Intake/Output Last 24 hours ?No intake or output data in the 24 hours ending 10/28/21 2348 ? ?Labs/Imaging ?Results for orders placed or performed during the hospital encounter of 10/28/21 (from the past 48 hour(s))  ?CBC WITH DIFFERENTIAL     Status: Abnormal  ? Collection Time: 10/28/21  9:51 PM  ?Result Value Ref Range  ? WBC 11.0 (H) 4.0 - 10.5 K/uL  ? RBC 1.77 (L) 3.87 - 5.11 MIL/uL  ? Hemoglobin 7.2 (L) 12.0 - 15.0 g/dL  ? HCT 19.3 (L) 36.0 - 46.0 %  ? MCV 109.0 (H) 80.0 - 100.0 fL  ? MCH 40.7 (H) 26.0 - 34.0 pg  ? MCHC 37.3 (H) 30.0 - 36.0 g/dL  ? RDW 19.8 (H) 11.5 - 15.5 %  ? Platelets 410 (H) 150 - 400 K/uL  ? nRBC 1.7 (H) 0.0 - 0.2 %  ? Neutrophils Relative % 44 %  ? Neutro Abs 4.9 1.7 - 7.7 K/uL  ? Lymphocytes Relative 38 %  ?  Lymphs Abs 4.2 (H) 0.7 - 4.0 K/uL  ? Monocytes Relative 15 %  ? Monocytes Absolute 1.7 (H) 0.1 - 1.0 K/uL  ? Eosinophils Relative 1 %  ? Eosinophils Absolute 0.1 0.0 - 0.5 K/uL  ? Basophils Relative 1 %  ? Basophils Absolute 0.1 0.0 - 0.1 K/uL  ? Immature Granulocytes 1 %  ? Abs Immature Granulocytes 0.10 (H) 0.00 - 0.07 K/uL  ?  Comment: Performed at [facility], [place] [address] [place], [place] [address]  ?Comprehensive metabolic panel     Status: Abnormal  ? Collection Time: 10/28/21  9:51 PM  ?Result Value Ref Range  ? Sodium 135 135 - 145 mmol/L  ? Potassium 3.4 (L) 3.5 - 5.1 mmol/L  ? Chloride 108 98 - 111 mmol/L  ? CO2 22 22 - 32 mmol/L  ? Glucose, Bld 147 (H) 70 - 99 mg/dL  ?  Comment: Glucose reference range applies only to samples taken after fasting for at least 8 hours.  ? BUN 9 6 - 20 mg/dL  ? Creatinine, Ser 0.52 0.44 - 1.00 mg/dL  ? Calcium  9.1 8.9 - 10.3 mg/dL  ? Total Protein 7.4 6.5 - 8.1 g/dL  ? Albumin 4.5 3.5 - 5.0 g/dL  ? AST 36 15 - 41 U/L  ? ALT 33 0 - 44 U/L  ? Alkaline Phosphatase 71 38 - 126 U/L  ? Total Bilirubin 2.6 (H) 0.3 - 1.2 mg/dL  ? GFR, Estimated >60 >60 mL/min  ?  Comment: (NOTE) ?Calculated using the CKD-EPI Creatinine Equation (2021) ?  ? Anion gap 5 5 - 15  ?  Comment: Performed at [facility], [place] [address], [place], [place] [address]  ?Reticulocytes     Status: Abnormal  ? Collection Time: 10/28/21  9:51 PM  ?Result Value Ref Range  ? Retic Ct Pct 25.5 (H) 0.4 - 3.1 %  ? RBC. 1.78 (L) 3.87 - 5.11 MIL/uL  ? Retic Count, Absolute 497.5 (H) 19.0 - 186.0 K/uL  ? Immature Retic Fract 33.6 (H) 2.3 - 15.9 %  ?  Comment: Performed at [facility], [place] [address] [place], [place] [address]  ? ?No results found. ? ?Pending Labs ?Unresulted Labs (From admission, onward)  ? ?  Start     Ordered  ? 10/28/21 2344  hCG, quantitative, pregnancy  ONCE - STAT,   STAT       ? 10/28/21 2343  ? 10/28/21 2055  Urinalysis, Routine w reflex microscopic  Once,   STAT       ? 10/28/21 2054  ? ?  ?  ? ?  ? ? ?Vitals/Pain ?Today's Vitals  ? 10/28/21 2245 10/28/21 2300 10/28/21 2330 10/28/21 2331  ?BP: 104/68 108/69 107/65   ?Pulse: 67 68 67   ?Resp: '13 13 12   '$ ?Temp:      ?TempSrc:      ?SpO2: 93% 92% 93%   ?Weight:      ?Height:      ?PainSc:    8   ? ? ?Isolation Precautions ?No active isolations ? ?Medications ?Medications  ?0.45 % sodium chloride infusion ( Intravenous New Bag/Given 10/28/21 2217)  ?HYDROmorphone (DILAUDID) injection 2 mg (2 mg Intravenous Given 10/28/21 2212)  ?HYDROmorphone (DILAUDID) injection 2 mg (2 mg Intravenous Given 10/28/21 2252)  ?ketorolac (TORADOL) 15 MG/ML injection 15 mg (15 mg Intravenous Given 10/28/21 2211)  ?diphenhydrAMINE (BENADRYL) injection 25 mg (25 mg Intravenous Given 10/28/21 2212)  ? ? ?  Mobility ?walks ?Low fall risk  ? ?Focused Assessments ? ? ? ?R ?Recommendations:  See Admitting Provider Note ? ?Report given to:  ? ?Additional Notes:  ? ? ?

## 2021-10-28 NOTE — ED Provider Notes (Signed)
?[facility] DEPT ?Provider Note ? ? ?CSN: [id number] ?Arrival date & time: 10/28/21  2029 ? ?  ? ?History ? ?Chief Complaint  ?Patient presents with  ? Sickle Cell Pain Crisis  ? ? ?Sandra Mckay is a 26 y.o. female history of sickle cell anemia, who presented with bilateral hip pain and back pain.  Patient has been having bilateral hip pain and back pain for the last several days.  She is on oxycodone at baseline.  She states that has not been controlling her pain.  Denies any fevers.  Patient follows up with sickle cell clinic. ? ?The history is provided by the patient.  ? ?  ? ?Home Medications ?Prior to Admission medications   ?Medication Sig Start Date End Date Taking? Authorizing Provider  ?DULoxetine (CYMBALTA) 20 MG capsule TAKE 2 CAPSULES BY MOUTH EVERY DAY 08/13/21 02/09/22  Vevelyn Francois, NP  ?folic acid (FOLVITE) 1 MG tablet Take 1 tablet (1 mg total) by mouth daily. ?Patient not taking: Reported on 08/06/2021 03/23/18   Azzie Glatter, FNP  ?hydroxyurea (HYDREA) 500 MG capsule TAKE 3 CAPSULES (1,500 MG TOTAL) BY MOUTH DAILY. TAKE 3 CAPSULES (1,500 MG TOTAL) BY MOUTH DAILY. 10/20/21   Vevelyn Francois, NP  ?ibuprofen (ADVIL) 800 MG tablet Take 1 tablet (800 mg total) by mouth every 8 (eight) hours as needed. ?Patient taking differently: Take 800 mg by mouth every 8 (eight) hours as needed for mild pain. 02/28/21   Vevelyn Francois, NP  ?Melatonin 10 MG CAPS Take 20 mg by mouth at bedtime as needed. ?Patient taking differently: Take 10 mg by mouth at bedtime as needed (sleep). 07/25/21   Vevelyn Francois, NP  ?ondansetron (ZOFRAN) 4 MG tablet Take 1 tablet (4 mg total) by mouth every 8 (eight) hours as needed for nausea or vomiting. ?Patient not taking: Reported on 08/06/2021 02/28/21   Vevelyn Francois, NP  ?oxyCODONE (ROXICODONE) 15 MG immediate release tablet Take 1 tablet (15 mg total) by mouth every 4 (four) hours as needed for up to 15 days for pain. 10/21/21 11/05/21  Vevelyn Francois, NP  ?QUEtiapine (SEROQUEL) 25 MG tablet Take 1 tablet (25 mg total) by mouth at bedtime. 05/08/21 05/08/22  Vevelyn Francois, NP  ?tiZANidine (ZANAFLEX) 4 MG tablet Take 1 tablet (4 mg total) by mouth 3 (three) times daily. 06/16/21 06/16/22  Vevelyn Francois, NP  ?   ? ?Allergies    ?Latex, Other, and Wound dressing adhesive   ? ?Review of Systems   ?Review of Systems  ?Musculoskeletal:  Positive for back pain.  ?     Hip pain  ?All other systems reviewed and are negative. ? ?Physical Exam ?Updated Vital Signs ?BP 107/65   Pulse 67   Temp 98.6 ?F (37 ?C) (Oral)   Resp 12   Ht '5\' 2"'$  (1.575 m)   Wt 59 kg   LMP 08/03/2021 (Approximate)   SpO2 93%   BMI 23.78 kg/m?  ?Physical Exam ?Vitals and nursing note reviewed.  ?Constitutional:   ?   Comments: Uncomfortable  ?HENT:  ?   Head: Normocephalic.  ?   Nose: Nose normal.  ?   Mouth/Throat:  ?   Mouth: Mucous membranes are moist.  ?Eyes:  ?   Extraocular Movements: Extraocular movements intact.  ?   Pupils: Pupils are equal, round, and reactive to light.  ?Cardiovascular:  ?   Rate and Rhythm: Normal rate and regular rhythm.  ?  Pulses: Normal pulses.  ?   Heart sounds: Normal heart sounds.  ?Pulmonary:  ?   Effort: Pulmonary effort is normal.  ?   Breath sounds: Normal breath sounds.  ?Abdominal:  ?   General: Abdomen is flat.  ?   Palpations: Abdomen is soft.  ?Musculoskeletal:  ?   Cervical back: Normal range of motion and neck supple.  ?   Comments: Mild diffuse spinal and bilateral hip tenderness.  No obvious deformity  ?Skin: ?   General: Skin is warm.  ?   Capillary Refill: Capillary refill takes less than 2 seconds.  ?Neurological:  ?   General: No focal deficit present.  ?   Mental Status: She is oriented to person, place, and time.  ?Psychiatric:     ?   Mood and Affect: Mood normal.     ?   Behavior: Behavior normal.  ? ? ?ED Results / Procedures / Treatments   ?Labs ?(all labs ordered are listed, but only abnormal results are displayed) ?Labs  Reviewed  ?CBC WITH DIFFERENTIAL/PLATELET - Abnormal; Notable for the following components:  ?    Result Value  ? WBC 11.0 (*)   ? RBC 1.77 (*)   ? Hemoglobin 7.2 (*)   ? HCT 19.3 (*)   ? MCV 109.0 (*)   ? MCH 40.7 (*)   ? MCHC 37.3 (*)   ? RDW 19.8 (*)   ? Platelets 410 (*)   ? nRBC 1.7 (*)   ? Lymphs Abs 4.2 (*)   ? Monocytes Absolute 1.7 (*)   ? Abs Immature Granulocytes 0.10 (*)   ? All other components within normal limits  ?COMPREHENSIVE METABOLIC PANEL - Abnormal; Notable for the following components:  ? Potassium 3.4 (*)   ? Glucose, Bld 147 (*)   ? Total Bilirubin 2.6 (*)   ? All other components within normal limits  ?RETICULOCYTES - Abnormal; Notable for the following components:  ? Retic Ct Pct 25.5 (*)   ? RBC. 1.78 (*)   ? Retic Count, Absolute 497.5 (*)   ? Immature Retic Fract 33.6 (*)   ? All other components within normal limits  ?URINALYSIS, ROUTINE W REFLEX MICROSCOPIC  ? ? ?EKG ?None ? ?Radiology ?No results found. ? ?Procedures ?Procedures  ? ? ?Medications Ordered in ED ?Medications  ?0.45 % sodium chloride infusion ( Intravenous New Bag/Given 10/28/21 2217)  ?HYDROmorphone (DILAUDID) injection 2 mg (2 mg Intravenous Given 10/28/21 2212)  ?HYDROmorphone (DILAUDID) injection 2 mg (2 mg Intravenous Given 10/28/21 2252)  ?ketorolac (TORADOL) 15 MG/ML injection 15 mg (15 mg Intravenous Given 10/28/21 2211)  ?diphenhydrAMINE (BENADRYL) injection 25 mg (25 mg Intravenous Given 10/28/21 2212)  ? ? ?ED Course/ Medical Decision Making/ A&P ?  ?                        ?Medical Decision Making ?Sandra Mckay is a 26 y.o. female history of sickle cell here presenting with bilateral hip pain and back pain.  This is typical of her pain crisis.  Patient is on oxycodone at home.  We will get CBC and BMP and reticulocyte count.  Will give pain medicine for sickle cell protocol ? ?11:36 PM ?CBC showed baseline hemoglobin 6.2 and reticulocyte elevated.  Still has 8 out of 10 pain.  Hospitalist to admit for sickle  cell pain crisis ? ? ?Amount and/or Complexity of Data Reviewed ?Labs: ordered. ? ?Risk ?Prescription drug management. ?Decision regarding hospitalization. ? ?  Final Clinical Impression(s) / ED Diagnoses ?Final diagnoses:  ?None  ? ? ?Rx / DC Orders ?ED Discharge Orders   ? ? None  ? ?  ? ? ?  ?Drenda Freeze, MD ?10/28/21 2338 ? ?

## 2021-10-28 NOTE — ED Triage Notes (Signed)
Reports [facility] pain worst in lower back hips, and knees x 2 days. Last dose of oxycodone '15mg'$  around noon with no improvement.  ?

## 2021-10-29 ENCOUNTER — Inpatient Hospital Stay (HOSPITAL_COMMUNITY): Payer: Medicaid Other

## 2021-10-29 ENCOUNTER — Encounter (HOSPITAL_COMMUNITY): Payer: Self-pay | Admitting: Internal Medicine

## 2021-10-29 DIAGNOSIS — E876 Hypokalemia: Secondary | ICD-10-CM

## 2021-10-29 DIAGNOSIS — D5700 Hb-SS disease with crisis, unspecified: Secondary | ICD-10-CM

## 2021-10-29 LAB — RAPID URINE DRUG SCREEN, HOSP PERFORMED
Amphetamines: NOT DETECTED
Barbiturates: NOT DETECTED
Benzodiazepines: NOT DETECTED
Cocaine: POSITIVE — AB
Opiates: POSITIVE — AB
Tetrahydrocannabinol: POSITIVE — AB

## 2021-10-29 LAB — URINALYSIS, ROUTINE W REFLEX MICROSCOPIC
Bacteria, UA: NONE SEEN
Bilirubin Urine: NEGATIVE
Glucose, UA: NEGATIVE mg/dL
Ketones, ur: NEGATIVE mg/dL
Leukocytes,Ua: NEGATIVE
Nitrite: NEGATIVE
Protein, ur: NEGATIVE mg/dL
Specific Gravity, Urine: 1.005 (ref 1.005–1.030)
pH: 6 (ref 5.0–8.0)

## 2021-10-29 LAB — HCG, QUANTITATIVE, PREGNANCY: hCG, Beta Chain, Quant, S: <1 m[IU]/mL (ref <5)

## 2021-10-29 MED ORDER — HYDROXYUREA 500 MG PO CAPS
1500.0000 mg | ORAL_CAPSULE | Freq: Every day | ORAL | Status: DC
  Administered 2021-10-29 – 2021-10-30 (×2): 1500 mg via ORAL
  Filled 2021-10-29 (×2): qty 3

## 2021-10-29 MED ORDER — METOPROLOL TARTRATE 5 MG/5ML IV SOLN: 5.0000 mg | INTRAVENOUS | Status: DC | PRN

## 2021-10-29 MED ORDER — OXYCODONE HCL 5 MG PO TABS
15.0000 mg | ORAL_TABLET | ORAL | Status: DC | PRN
  Administered 2021-10-29 – 2021-10-30 (×4): 15 mg via ORAL
  Filled 2021-10-29 (×4): qty 3

## 2021-10-29 MED ORDER — SODIUM CHLORIDE 0.45 % IV SOLN: INTRAVENOUS | Status: DC

## 2021-10-29 MED ORDER — SODIUM CHLORIDE 0.9% FLUSH: 9.0000 mL | INTRAVENOUS | Status: DC | PRN

## 2021-10-29 MED ORDER — TRAZODONE HCL 50 MG PO TABS: 50.0000 mg | ORAL_TABLET | Freq: Every evening | ORAL | Status: DC | PRN

## 2021-10-29 MED ORDER — QUETIAPINE FUMARATE 25 MG PO TABS: 25.0000 mg | ORAL_TABLET | Freq: Every day | ORAL | Status: DC

## 2021-10-29 MED ORDER — DULOXETINE HCL 20 MG PO CPEP
40.0000 mg | ORAL_CAPSULE | Freq: Every day | ORAL | Status: DC
Start: 2021-10-29 — End: 2021-10-30
  Administered 2021-10-29: 40 mg via ORAL
  Filled 2021-10-29 (×2): qty 2

## 2021-10-29 MED ORDER — HYDRALAZINE HCL 20 MG/ML IJ SOLN: 10.0000 mg | INTRAMUSCULAR | Status: DC | PRN

## 2021-10-29 MED ORDER — PANTOPRAZOLE SODIUM 40 MG PO TBEC
40.0000 mg | DELAYED_RELEASE_TABLET | Freq: Every day | ORAL | Status: DC
  Administered 2021-10-29: 40 mg via ORAL
  Filled 2021-10-29 (×2): qty 1

## 2021-10-29 MED ORDER — POLYETHYLENE GLYCOL 3350 17 G PO PACK: 17.0000 g | PACK | Freq: Every day | ORAL | Status: DC | PRN

## 2021-10-29 MED ORDER — IPRATROPIUM-ALBUTEROL 0.5-2.5 (3) MG/3ML IN SOLN: 3.0000 mL | RESPIRATORY_TRACT | Status: DC | PRN

## 2021-10-29 MED ORDER — KETOROLAC TROMETHAMINE 15 MG/ML IJ SOLN
15.0000 mg | Freq: Four times a day (QID) | INTRAMUSCULAR | Status: DC
  Administered 2021-10-29 – 2021-10-30 (×5): 15 mg via INTRAVENOUS
  Filled 2021-10-29 (×5): qty 1

## 2021-10-29 MED ORDER — ONDANSETRON HCL 4 MG/2ML IJ SOLN
4.0000 mg | Freq: Four times a day (QID) | INTRAMUSCULAR | Status: DC | PRN
  Administered 2021-10-29: 4 mg via INTRAVENOUS
  Filled 2021-10-29: qty 2

## 2021-10-29 MED ORDER — ENOXAPARIN SODIUM 40 MG/0.4ML IJ SOSY
40.0000 mg | PREFILLED_SYRINGE | INTRAMUSCULAR | Status: DC
  Administered 2021-10-29: 40 mg via SUBCUTANEOUS
  Filled 2021-10-29 (×2): qty 0.4

## 2021-10-29 MED ORDER — HYDROMORPHONE 1 MG/ML IV SOLN
INTRAVENOUS | Status: DC
  Administered 2021-10-29: 30 mg via INTRAVENOUS
  Administered 2021-10-29: 3.5 mg via INTRAVENOUS
  Administered 2021-10-29: 2.5 mg via INTRAVENOUS
  Administered 2021-10-29: 3.9 mg via INTRAVENOUS
  Administered 2021-10-30: 1.5 mg via INTRAVENOUS
  Administered 2021-10-30: 2.4 mg via INTRAVENOUS
  Filled 2021-10-29: qty 30

## 2021-10-29 MED ORDER — NALOXONE HCL 0.4 MG/ML IJ SOLN: 0.4000 mg | INTRAMUSCULAR | Status: DC | PRN

## 2021-10-29 MED ORDER — SENNOSIDES-DOCUSATE SODIUM 8.6-50 MG PO TABS
1.0000 | ORAL_TABLET | Freq: Two times a day (BID) | ORAL | Status: DC
  Administered 2021-10-29: 1 via ORAL
  Filled 2021-10-29 (×3): qty 1

## 2021-10-29 MED ORDER — GUAIFENESIN 100 MG/5ML PO LIQD: 5.0000 mL | ORAL | Status: DC | PRN

## 2021-10-29 MED ORDER — POTASSIUM CHLORIDE CRYS ER 20 MEQ PO TBCR
20.0000 meq | EXTENDED_RELEASE_TABLET | Freq: Once | ORAL | Status: AC
  Administered 2021-10-29: 20 meq via ORAL
  Filled 2021-10-29: qty 1

## 2021-10-29 NOTE — Assessment & Plan Note (Signed)
Continue her home Cymbalta and Seroquel. ?

## 2021-10-29 NOTE — Assessment & Plan Note (Signed)
Reactive.  No obvious signs of infection ?

## 2021-10-29 NOTE — Progress Notes (Signed)
Sandra Mckay is a 26 year old female with a medical history significant for sickle cell disease, chronic pain syndrome, opiate dependence and tolerance, and history of anemia of chronic disease was admitted overnight and sickle cell pain crisis. ?Patient is having worsening pain in bilateral hips.  She states that she is unable to lie down due to "excruciating" bilateral hip pain.  Her pain intensity is 8/10 characterized as constant and aching.  She denies any headache, dizziness, chest pain, shortness of breath, urinary symptoms, nausea, vomiting, or diarrhea. ? ?Care plan: ?Decrease IV fluids to [facility] ?Oxycodone 15 mg every 4 hours as needed for severe breakthrough pain ?Today, patient's hemoglobin is decreased to 7.2 g/dL.  We will repeat in AM.  If hemoglobin is less than 6.5 g/dL, transfuse 1 unit PRBCs. ? ?We will reassess in AM. ? ?[name]  APRN, MSN, FNP-C ?[facility] ?[facility] ?[address]  ?[place], [place] [address] ?[phone] ? ?

## 2021-10-29 NOTE — Assessment & Plan Note (Signed)
Along with microcytosis due to sickle cell.  Continue to monitor hemoglobin.  Transfuse as needed. ?

## 2021-10-29 NOTE — Assessment & Plan Note (Signed)
Pain control- currently on Dilaudid PCA pump.  IV fluids.  Hydroxyurea 1500 mg orally daily. ?Elevated reticulocyte count as expected. ?Hemoglobin 7.2. ?Bowel regimen ?

## 2021-10-29 NOTE — H&P (Signed)
?History and Physical  ? ? [name] HER:[id number] DOB: 1996/01/14 DOA: 10/28/2021 ? ?PCP: Vevelyn Francois, NP  ?Patient coming from: Home. ? ?Chief Complaint: Pain. ? ?HPI: Sandra Mckay is a 26 y.o. female with history of sickle cell anemia and depression presents to the ER because of increasing pain in the lower extremity over the last 2 days.  Denies any fever chills headache chest pain or shortness of breath. ? ?ED Course: In the ER patient was not hypoxic.  Afebrile.  Despite giving multiple dose of pain relief medication patient was still in pain admitted for sickle cell pain crisis. ? ?Review of Systems: As per HPI, rest all negative. ? ? ?Past Medical History:  ?Diagnosis Date  ? Blood transfusion without reported diagnosis   ? Bronchitis   ? Chickenpox   ? Depression   ? Elevated ferritin level 05/2019  ? Heart murmur   ? Pulmonary hypertension ([place])   ? Sickle cell anemia (HCC)   ? 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 Thrombocytosis 05/2019  ? Urinary tract infection   ? Vitamin D deficiency   ? ? ?Past Surgical History:  ?Procedure Laterality Date  ? CHOLECYSTECTOMY  2011  ? EYE SURGERY    ? Sty removal  ? [place]  ? 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 TONSILLECTOMY  2012  ? ? ? reports that she has been smoking cigarettes. She has been smoking an average of 1 pack per day. She has never used smokeless tobacco. She reports current alcohol use. She reports that she does not use drugs. ? ?Allergies  ?Allergen Reactions  ? Latex Rash  ? Other Rash  ?  Latex Tape  ? Wound Dressing Adhesive Rash  ? ? ?Family History  ?Problem Relation Age of Onset  ? Arthritis Other   ?     grandparent  ? Stroke Other   ? Hypertension Other   ? Diabetes Other   ?     grandparent  ? Cancer - Other Other   ?     Glioblastoma  ? ? ?Prior to Admission medications   ?Medication Sig Start Date End Date Taking? Authorizing Provider  ?DULoxetine (CYMBALTA) 20 MG  capsule TAKE 2 CAPSULES BY MOUTH EVERY DAY 08/13/21 02/09/22  Vevelyn Francois, NP  ?folic acid (FOLVITE) 1 MG tablet Take 1 tablet (1 mg total) by mouth daily. ?Patient not taking: Reported on 08/06/2021 03/23/18   Azzie Glatter, FNP  ?hydroxyurea (HYDREA) 500 MG capsule TAKE 3 CAPSULES (1,500 MG TOTAL) BY MOUTH DAILY. TAKE 3 CAPSULES (1,500 MG TOTAL) BY MOUTH DAILY. 10/20/21   Vevelyn Francois, NP  ?ibuprofen (ADVIL) 800 MG tablet Take 1 tablet (800 mg total) by mouth every 8 (eight) hours as needed. ?Patient taking differently: Take 800 mg by mouth every 8 (eight) hours as needed for mild pain. 02/28/21   Vevelyn Francois, NP  ?Melatonin 10 MG CAPS Take 20 mg by mouth at bedtime as needed. ?Patient taking differently: Take 10 mg by mouth at bedtime as needed (sleep). 07/25/21   Vevelyn Francois, NP  ?ondansetron (ZOFRAN) 4 MG tablet Take 1 tablet (4 mg total) by mouth every 8 (eight) hours as needed for nausea or vomiting. ?Patient not taking: Reported on 08/06/2021 02/28/21   Vevelyn Francois, NP  ?oxyCODONE (ROXICODONE) 15 MG immediate release tablet Take 1 tablet (15 mg total) by mouth every  4 (four) hours as needed for up to 15 days for pain. 10/21/21 11/05/21  Vevelyn Francois, NP  ?QUEtiapine (SEROQUEL) 25 MG tablet Take 1 tablet (25 mg total) by mouth at bedtime. 05/08/21 05/08/22  Vevelyn Francois, NP  ?tiZANidine (ZANAFLEX) 4 MG tablet Take 1 tablet (4 mg total) by mouth 3 (three) times daily. 06/16/21 06/16/22  Vevelyn Francois, NP  ? ? ?Physical Exam: ?Constitutional: Moderately built and nourished. ?Vitals:  ? 10/28/21 2230 10/28/21 2245 10/28/21 2300 10/28/21 2330  ?BP: 107/70 104/68 108/69 107/65  ?Pulse: 77 67 68 67  ?Resp: (!) '23 13 13 12  '$ ?Temp:      ?TempSrc:      ?SpO2: 96% 93% 92% 93%  ?Weight:      ?Height:      ? ?Eyes: Anicteric no pallor. ?ENMT: No discharge from the ears eyes nose and mouth. ?Neck: No mass felt.  No neck rigidity. ?Respiratory: No rhonchi or crepitations. ?Cardiovascular: S1-S2  heard. ?Abdomen: Soft nontender bowel sound present. ?Musculoskeletal: No edema. ?Skin: No rash. ?Neurologic: Alert awake oriented time place and person.  Moves all extremities. ?Psychiatric: Appears normal.  Normal affect. ? ? ?Labs on Admission: I have personally reviewed following labs and imaging studies ? ?CBC: ?Recent Labs  ?Lab 10/28/21 ?2151  ?WBC 11.0*  ?NEUTROABS 4.9  ?HGB 7.2*  ?HCT 19.3*  ?MCV 109.0*  ?PLT 410*  ? ?Basic Metabolic Panel: ?Recent Labs  ?Lab 10/28/21 ?2151  ?NA 135  ?K 3.4*  ?CL 108  ?CO2 22  ?GLUCOSE 147*  ?BUN 9  ?CREATININE 0.52  ?CALCIUM 9.1  ? ?GFR: ?Estimated Creatinine Clearance: 82.8 mL/min (by C-G formula based on SCr of 0.52 mg/dL). ?Liver Function Tests: ?Recent Labs  ?Lab 10/28/21 ?2151  ?AST 36  ?ALT 33  ?ALKPHOS 71  ?BILITOT 2.6*  ?PROT 7.4  ?ALBUMIN 4.5  ? ?No results for input(s): LIPASE, AMYLASE in the last 168 hours. ?No results for input(s): AMMONIA in the last 168 hours. ?Coagulation Profile: ?No results for input(s): INR, PROTIME in the last 168 hours. ?Cardiac Enzymes: ?No results for input(s): CKTOTAL, CKMB, CKMBINDEX, TROPONINI in the last 168 hours. ?BNP (last 3 results) ?No results for input(s): PROBNP in the last 8760 hours. ?HbA1C: ?No results for input(s): HGBA1C in the last 72 hours. ?CBG: ?No results for input(s): GLUCAP in the last 168 hours. ?Lipid Profile: ?No results for input(s): CHOL, HDL, LDLCALC, TRIG, CHOLHDL, LDLDIRECT in the last 72 hours. ?Thyroid Function Tests: ?No results for input(s): TSH, T4TOTAL, FREET4, T3FREE, THYROIDAB in the last 72 hours. ?Anemia Panel: ?Recent Labs  ?  10/28/21 ?2151  ?RETICCTPCT 25.5*  ? ?Urine analysis: ?   ?Component Value Date/Time  ? COLORURINE YELLOW 12/05/2016 0337  ? APPEARANCEUR CLEAR 12/05/2016 0337  ? LABSPEC 1.015 08/04/2017 1144  ? PHURINE 7.0 08/04/2017 1144  ? GLUCOSEU NEGATIVE 08/04/2017 1144  ? [place] NEGATIVE 08/04/2017 1144  ? BILIRUBINUR neg 08/06/2020 1525  ? KETONESUR negative 09/27/2018 1014  ?  [place] NEGATIVE 08/04/2017 1144  ? PROTEINUR Negative 08/06/2020 1525  ? PROTEINUR NEGATIVE 08/04/2017 1144  ? UROBILINOGEN 0.2 08/06/2020 1525  ? UROBILINOGEN >=8.0 08/04/2017 1144  ? NITRITE neg 08/06/2020 1525  ? NITRITE NEGATIVE 08/04/2017 1144  ? LEUKOCYTESUR Negative 08/06/2020 1525  ? ?Sepsis Labs: ?'@LABRCNTIP'$ (procalcitonin:4,lacticidven:4) ?)No results found for this or any previous visit (from the past 240 hour(s)).  ? ?Radiological Exams on Admission: ?No results found. ? ? ?Assessment/Plan ?Principal Problem: ?  Sickle cell pain crisis ([place]) ?  ? ?  Sickle cell pain crisis -Place patient on Dilaudid PCA IV fluids Toradol. ?Sickle cell anemia on hydroxyurea.  Any further worsening of hemoglobin may have to hold hydroxyurea. ?History of depression on Seroquel. ?Leukocytosis likely reactionary.  Patient afebrile.  Continue to monitor. ?Mild hypokalemia replace and recheck. ? ?Since patient is requiring Dilaudid PCA for sickle cell pain crisis management will need inpatient status. ? ? ?DVT prophylaxis: Lovenox. ?Code Status: Full code. ?Family Communication: Discussed with patient. ?Disposition Plan: Home. ?Consults called: None. ?Admission status: Inpatient. ? ? ?[name] MD ?[facility] ?Pager [phone]. ? ?If 7PM-7AM, please contact night-coverage ?[email or web address] ?Password [email or web address] ? ?10/29/2021, 12:05 AM  ? ? ? ?

## 2021-10-29 NOTE — Assessment & Plan Note (Signed)
Replete as necessary ?

## 2021-10-30 LAB — CBC
HCT: 18 % — ABNORMAL LOW (ref 36.0–46.0)
Hemoglobin: 6.4 g/dL — CL (ref 12.0–15.0)
MCH: 40 pg — ABNORMAL HIGH (ref 26.0–34.0)
MCHC: 35.6 g/dL (ref 30.0–36.0)
MCV: 112.5 fL — ABNORMAL HIGH (ref 80.0–100.0)
Platelets: 354 10*3/uL (ref 150–400)
RBC: 1.6 MIL/uL — ABNORMAL LOW (ref 3.87–5.11)
RDW: 21.8 % — ABNORMAL HIGH (ref 11.5–15.5)
WBC: 8 10*3/uL (ref 4.0–10.5)
nRBC: 2.3 % — ABNORMAL HIGH (ref 0.0–0.2)

## 2021-10-30 LAB — BASIC METABOLIC PANEL
Anion gap: 5 (ref 5–15)
BUN: 8 mg/dL (ref 6–20)
CO2: 22 mmol/L (ref 22–32)
Calcium: 8.9 mg/dL (ref 8.9–10.3)
Chloride: 108 mmol/L (ref 98–111)
Creatinine, Ser: 0.38 mg/dL — ABNORMAL LOW (ref 0.44–1.00)
GFR, Estimated: >60 mL/min (ref >60)
Glucose, Bld: 89 mg/dL (ref 70–99)
Potassium: 4.1 mmol/L (ref 3.5–5.1)
Sodium: 135 mmol/L (ref 135–145)

## 2021-10-30 LAB — MAGNESIUM: Magnesium: 1.9 mg/dL (ref 1.7–2.4)

## 2021-10-30 LAB — HEMOGLOBIN AND HEMATOCRIT, BLOOD
HCT: 25.2 % — ABNORMAL LOW (ref 36.0–46.0)
Hemoglobin: 9.3 g/dL — ABNORMAL LOW (ref 12.0–15.0)

## 2021-10-30 LAB — PREPARE RBC (CROSSMATCH)

## 2021-10-30 MED ORDER — ONDANSETRON HCL 4 MG/2ML IJ SOLN
4.0000 mg | Freq: Four times a day (QID) | INTRAMUSCULAR | Status: DC | PRN
Start: 2021-10-30 — End: 2021-10-30
  Administered 2021-10-30: 4 mg via INTRAVENOUS
  Filled 2021-10-30: qty 2

## 2021-10-30 MED ORDER — SODIUM CHLORIDE 0.9% IV SOLUTION: Freq: Once | INTRAVENOUS | Status: AC

## 2021-10-30 NOTE — Clinical Social Work Note (Signed)
?  Transition of Care (TOC) Screening Note ? ? ?Patient Details  ?Name: Asyia Hornung ?Date of Birth: 1996/03/18 ? ? ?Transition of Care (TOC) CM/SW Contact:    ?Trish Mage, LCSW ?Phone Number: ?10/30/2021, 9:18 AM ? ? ? ?Transition of Care Department [facility]) has reviewed patient and no TOC needs have been identified at this time. We will continue to monitor patient advancement through interdisciplinary progression rounds. If new patient transition needs arise, please place a TOC consult. ? ? ?

## 2021-10-30 NOTE — Progress Notes (Signed)
PCA removed from patients room wasted 2 mL of Dilaudid left in syringe with Garlon Hatchet RN in stericycle.  ?

## 2021-10-30 NOTE — Discharge Summary (Signed)
?Physician Discharge Summary  ?Sandra Mckay QIW:[id number] DOB: 11/13/1995 DOA: 10/28/2021 ? ?PCP: Vevelyn Francois, NP ? ?Admit date: 10/28/2021 ? ?Discharge date: 10/30/2021 ? ?Discharge Diagnoses:  ?Principal Problem: ?  Sickle cell pain crisis ([place]) ?Active Problems: ?  Chronic anemia ?  Anxiety and depression ?  Leukocytosis ?  Hypokalemia ? ? ?Discharge Condition: Stable ? ?Disposition:  ? Follow-up Information   ? ? Bo Merino I, NP Follow up.   ?Specialty: Nurse Practitioner ?Contact information: ?[address] ?[place] [address] ?[phone] ? ? ?  ?  ? ? Dorena Dew, FNP Follow up.   ?Specialty: Family Medicine ?Contact information: ?[address] ?[address] ?[place] [address] ?[phone] ? ? ?  ?  ? ?  ?  ? ?  ? ?Pt is discharged home in good condition and is to follow up with Vevelyn Francois, NP this week to have labs evaluated. Samaiya Awadallah is instructed to increase activity slowly and balance with rest for the next few days, and use prescribed medication to complete treatment of pain ? ?Diet: Regular ?Wt Readings from Last 3 Encounters:  ?10/28/21 59 kg  ?08/06/21 59 kg  ?07/25/21 59.4 kg  ? ? ?History of present illness:  ?Sandra Mckay is a 26 year old female with a medical history significant for sickle cell anemia, depression, chronic pain syndrome, opiate dependence and tolerance, anemia of chronic disease, and history of polysubstance abuse presented to the ER because of increasing pain in lower extremities over the past 2 days.  Patient denies any fever, chills, headache, chest pain, or shortness of breath. ?ER course: ?In the ER, patient was not hypoxic.  Afebrile.  Despite given multiple doses of pain relief medication, patient was still experiencing pain.  Admitted for sickle cell pain crisis. ? ? ?Hospital Course:  ?Sickle cell disease with pain crisis: Patient was admitted for sickle cell pain crisis and managed appropriately with IVF, IV Dilaudid via PCA and  IV Toradol, as well as other adjunct therapies per sickle cell pain management protocols.  IV Dilaudid PCA discontinued due to positive UDS.  Patient transition to her home medications oxycodone 15 mg every 4 hours as needed.  Patient advised to resume her home medications.  She will follow-up with PCP in 1 week for medication management. ? ?Anemia of chronic disease: ?During admission, decreased to 6.4 g/dL, which is below patient's baseline of 7-8 g/dL.  Patient was transfused 1 unit PRBCs without complication.  Hemoglobin increased to 9.3 g/dL prior to discharge.  She will follow-up with PCP in 1 week to repeat labs. ? ?Polysubstance abuse: ?Reviewed UDS.  Positive for cocaine and marijuana.  Discussed with patient at length.  Offered alcohol and drug services referral, patient declined.  She states that she does not use cocaine but endorses being around it.  Counseled at length on the dangers of illicit drugs especially in the setting of this.  Patient expressed understanding. ? ?Patient was therefore discharged home today in a hemodynamically stable condition.  ? ?Sandra Mckay will follow-up with PCP within 1 week of this discharge. Sandra Mckay was counseled extensively about nonpharmacologic means of pain management, patient verbalized understanding and was appreciative of  the care received during this admission.  ? ?We discussed the need for good hydration, monitoring of hydration status, avoidance of heat, cold, stress, and infection triggers. We discussed the need to be adherent with taking Hydrea and other home medications. Patient was reminded of the need to seek medical attention  immediately if any symptom of bleeding, anemia, or infection occurs. ? ?Discharge Exam: ?Vitals:  ? 10/30/21 1153 10/30/21 1446  ?BP: 104/65 129/80  ?Pulse: (!) 58 72  ?Resp: 16 16  ?Temp: 98.2 ?F (36.8 ?C) 97.8 ?F (36.6 ?C)  ?SpO2: 96% 97%  ? ?Vitals:  ? 10/30/21 0831 10/30/21 1126 10/30/21 1153 10/30/21 1446  ?BP:  113/73 104/65  129/80  ?Pulse:  72 (!) 58 72  ?Resp: '16 15 16 16  '$ ?Temp:  98.1 ?F (36.7 ?C) 98.2 ?F (36.8 ?C) 97.8 ?F (36.6 ?C)  ?TempSrc:  Oral Oral Oral  ?SpO2: 94% 95% 96% 97%  ?Weight:      ?Height:      ? ? ?General appearance : Awake, alert, not in any distress. Speech Clear. Not toxic looking ?HEENT: Atraumatic and Normocephalic, pupils equally reactive to light and accomodation ?Neck: Supple, no JVD. No cervical lymphadenopathy.  ?Chest: Good air entry bilaterally, no added sounds  ?CVS: S1 S2 regular, no murmurs.  ?Abdomen: Bowel sounds present, Non tender and not distended with no gaurding, rigidity or rebound. ?Extremities: B/L Lower Ext shows no edema, both legs are warm to touch ?Neurology: Awake alert, and oriented X 3, CN II-XII intact, Non focal ?Skin: No Rash ? ?Discharge Instructions ? ?Discharge Instructions   ? ? Discharge patient   Complete by: As directed ?  ? Discharge disposition: 01-Home or Self Care  ? Discharge patient date: 10/30/2021  ? ?  ? ?Allergies as of 10/30/2021   ? ?   Reactions  ? Latex Rash  ? Wound Dressing Adhesive Rash  ? ?  ? ?  ?Medication List  ?  ? ?TAKE these medications   ? ?DULoxetine 20 MG capsule ?Commonly known as: CYMBALTA ?TAKE 2 CAPSULES BY MOUTH EVERY DAY ?  ?folic acid 1 MG tablet ?Commonly known as: FOLVITE ?Take 1 tablet (1 mg total) by mouth daily. ?  ?hydroxyurea 500 MG capsule ?Commonly known as: HYDREA ?TAKE 3 CAPSULES (1,500 MG TOTAL) BY MOUTH DAILY. TAKE 3 CAPSULES (1,500 MG TOTAL) BY MOUTH DAILY. ?  ?ibuprofen 800 MG tablet ?Commonly known as: ADVIL ?Take 1 tablet (800 mg total) by mouth every 8 (eight) hours as needed. ?What changed: reasons to take this ?  ?ketorolac 10 MG tablet ?Commonly known as: TORADOL ?Take 10 mg by mouth every 6 (six) hours as needed for pain. ?  ?Melatonin 10 MG Caps ?Take 20 mg by mouth at bedtime as needed. ?  ?ondansetron 4 MG tablet ?Commonly known as: Zofran ?Take 1 tablet (4 mg total) by mouth every 8 (eight) hours as needed for nausea  or vomiting. ?  ?oxyCODONE 15 MG immediate release tablet ?Commonly known as: ROXICODONE ?Take 1 tablet (15 mg total) by mouth every 4 (four) hours as needed for up to 15 days for pain. ?  ?QUEtiapine 25 MG tablet ?Commonly known as: SEROquel ?Take 1 tablet (25 mg total) by mouth at bedtime. ?  ?tiZANidine 4 MG tablet ?Commonly known as: ZANAFLEX ?Take 1 tablet (4 mg total) by mouth 3 (three) times daily. ?What changed:  ?when to take this ?reasons to take this ?  ? ?  ? ? ?The results of significant diagnostics from this hospitalization (including imaging, microbiology, ancillary and laboratory) are listed below for reference.   ? ?Significant Diagnostic Studies: ?DG HIPS BILAT WITH PELVIS 3-4 VIEWS ? ?Result Date: 10/29/2021 ?CLINICAL DATA:  Chronic bilateral hip pain without trauma EXAM: DG HIP (WITH OR WITHOUT PELVIS) 3-4V BILAT COMPARISON:  None. FINDINGS: Femoral heads are located. Sacroiliac joints are symmetric. Joint spaces maintained. No acute fracture or dislocation. No focal osseous lesion. IMPRESSION: No acute osseous abnormality. Electronically Signed   By: Abigail Miyamoto M.D.   On: 10/29/2021 13:28   ? ?Microbiology: ?No results found for this or any previous visit (from the past 240 hour(s)).  ? ?Labs: ?Basic Metabolic Panel: ?Recent Labs  ?Lab 10/28/21 ?2151 10/30/21 ?0355  ?NA 135 135  ?K 3.4* 4.1  ?CL 108 108  ?CO2 22 22  ?GLUCOSE 147* 89  ?BUN 9 8  ?CREATININE 0.52 0.38*  ?CALCIUM 9.1 8.9  ?MG  --  1.9  ? ?Liver Function Tests: ?Recent Labs  ?Lab 10/28/21 ?2151  ?AST 36  ?ALT 33  ?ALKPHOS 71  ?BILITOT 2.6*  ?PROT 7.4  ?ALBUMIN 4.5  ? ?No results for input(s): LIPASE, AMYLASE in the last 168 hours. ?No results for input(s): AMMONIA in the last 168 hours. ?CBC: ?Recent Labs  ?Lab 10/28/21 ?2151 10/30/21 ?9741 10/30/21 ?1633  ?WBC 11.0* 8.0  --   ?NEUTROABS 4.9  --   --   ?HGB 7.2* 6.4* 9.3*  ?HCT 19.3* 18.0* 25.2*  ?MCV 109.0* 112.5*  --   ?PLT 410* 354  --   ? ?Cardiac Enzymes: ?No results for  input(s): CKTOTAL, CKMB, CKMBINDEX, TROPONINI in the last 168 hours. ?BNP: ?Invalid input(s): POCBNP ?CBG: ?No results for input(s): GLUCAP in the last 168 hours. ? ?Time coordinating discharge: 30 minutes ? ?

## 2021-10-31 LAB — BPAM RBC
Blood Product Expiration Date: 202304252359
ISSUE DATE / TIME: 202303301128
Unit Type and Rh: 5100

## 2021-10-31 LAB — TYPE AND SCREEN
ABO/RH(D): A POS
Antibody Screen: NEGATIVE
Donor AG Type: NEGATIVE
Unit division: 0

## 2021-11-03 ENCOUNTER — Telehealth: Payer: Self-pay | Admitting: Nurse Practitioner

## 2021-11-03 NOTE — Telephone Encounter (Signed)
Oxycodone refill request.

## 2021-11-04 ENCOUNTER — Telehealth: Payer: Self-pay

## 2021-11-04 NOTE — Telephone Encounter (Signed)
Oxycodone 15 mg 

## 2021-11-05 ENCOUNTER — Non-Acute Institutional Stay (HOSPITAL_COMMUNITY)
Admission: AD | Admit: 2021-11-05 | Discharge: 2021-11-05 | Disposition: A | Discharge status: Home / Self Care | Payer: Medicaid Other | Source: Ambulatory Visit | Attending: Internal Medicine | Admitting: Internal Medicine

## 2021-11-05 ENCOUNTER — Other Ambulatory Visit: Payer: Medicaid Other

## 2021-11-05 ENCOUNTER — Telehealth (HOSPITAL_COMMUNITY): Payer: Self-pay

## 2021-11-05 DIAGNOSIS — G894 Chronic pain syndrome: Secondary | ICD-10-CM | POA: Insufficient documentation

## 2021-11-05 DIAGNOSIS — F1120 Opioid dependence, uncomplicated: Secondary | ICD-10-CM | POA: Insufficient documentation

## 2021-11-05 DIAGNOSIS — D5700 Hb-SS disease with crisis, unspecified: Secondary | ICD-10-CM | POA: Diagnosis present

## 2021-11-05 DIAGNOSIS — Z79899 Other long term (current) drug therapy: Secondary | ICD-10-CM | POA: Diagnosis not present

## 2021-11-05 LAB — COMPREHENSIVE METABOLIC PANEL
ALT: 19 U/L (ref 0–44)
AST: 30 U/L (ref 15–41)
Albumin: 4.7 g/dL (ref 3.5–5.0)
Alkaline Phosphatase: 62 U/L (ref 38–126)
Anion gap: 6 (ref 5–15)
BUN: 14 mg/dL (ref 6–20)
CO2: 23 mmol/L (ref 22–32)
Calcium: 9.3 mg/dL (ref 8.9–10.3)
Chloride: 109 mmol/L (ref 98–111)
Creatinine, Ser: 0.37 mg/dL — ABNORMAL LOW (ref 0.44–1.00)
GFR, Estimated: >60 mL/min (ref >60)
Glucose, Bld: 113 mg/dL — ABNORMAL HIGH (ref 70–99)
Potassium: 4.2 mmol/L (ref 3.5–5.1)
Sodium: 138 mmol/L (ref 135–145)
Total Bilirubin: 1.7 mg/dL — ABNORMAL HIGH (ref 0.3–1.2)
Total Protein: 7.4 g/dL (ref 6.5–8.1)

## 2021-11-05 LAB — CBC WITH DIFFERENTIAL/PLATELET
Abs Immature Granulocytes: 0.02 10*3/uL (ref 0.00–0.07)
Basophils Absolute: 0.1 10*3/uL (ref 0.0–0.1)
Basophils Relative: 1 %
Eosinophils Absolute: 0.1 10*3/uL (ref 0.0–0.5)
Eosinophils Relative: 1 %
HCT: 21.9 % — ABNORMAL LOW (ref 36.0–46.0)
Hemoglobin: 7.7 g/dL — ABNORMAL LOW (ref 12.0–15.0)
Immature Granulocytes: 0 %
Lymphocytes Relative: 30 %
Lymphs Abs: 3 10*3/uL (ref 0.7–4.0)
MCH: 37.6 pg — ABNORMAL HIGH (ref 26.0–34.0)
MCHC: 35.2 g/dL (ref 30.0–36.0)
MCV: 106.8 fL — ABNORMAL HIGH (ref 80.0–100.0)
Monocytes Absolute: 1.3 10*3/uL — ABNORMAL HIGH (ref 0.1–1.0)
Monocytes Relative: 13 %
Neutro Abs: 5.5 10*3/uL (ref 1.7–7.7)
Neutrophils Relative %: 55 %
Platelets: 454 10*3/uL — ABNORMAL HIGH (ref 150–400)
RBC: 2.05 MIL/uL — ABNORMAL LOW (ref 3.87–5.11)
RDW: 15.5 % (ref 11.5–15.5)
WBC: 10.1 10*3/uL (ref 4.0–10.5)
nRBC: 0.2 % (ref 0.0–0.2)

## 2021-11-05 LAB — RAPID URINE DRUG SCREEN, HOSP PERFORMED
Amphetamines: NOT DETECTED
Barbiturates: NOT DETECTED
Benzodiazepines: POSITIVE — AB
Cocaine: NOT DETECTED
Opiates: POSITIVE — AB
Tetrahydrocannabinol: POSITIVE — AB

## 2021-11-05 LAB — RETICULOCYTES
Immature Retic Fract: 27.7 % — ABNORMAL HIGH (ref 2.3–15.9)
RBC.: 2 MIL/uL — ABNORMAL LOW (ref 3.87–5.11)
Retic Count, Absolute: 162.8 10*3/uL (ref 19.0–186.0)
Retic Ct Pct: 8.1 % — ABNORMAL HIGH (ref 0.4–3.1)

## 2021-11-05 MED ORDER — SODIUM CHLORIDE 0.45 % IV SOLN: INTRAVENOUS | Status: DC

## 2021-11-05 MED ORDER — HYDROMORPHONE HCL 2 MG/ML IJ SOLN
2.0000 mg | INTRAMUSCULAR | Status: AC
  Administered 2021-11-05 (×3): 2 mg via SUBCUTANEOUS
  Filled 2021-11-05 (×3): qty 1

## 2021-11-05 MED ORDER — ONDANSETRON 4 MG PO TBDP
4.0000 mg | ORAL_TABLET | Freq: Four times a day (QID) | ORAL | Status: DC | PRN
  Administered 2021-11-05: 4 mg via ORAL
  Filled 2021-11-05: qty 1

## 2021-11-05 MED ORDER — KETOROLAC TROMETHAMINE 30 MG/ML IJ SOLN
15.0000 mg | Freq: Once | INTRAMUSCULAR | Status: AC
  Administered 2021-11-05: 15 mg via INTRAVENOUS
  Filled 2021-11-05: qty 1

## 2021-11-05 MED ORDER — DIPHENHYDRAMINE HCL 25 MG PO CAPS
25.0000 mg | ORAL_CAPSULE | Freq: Once | ORAL | Status: AC
  Administered 2021-11-05: 25 mg via ORAL
  Filled 2021-11-05: qty 1

## 2021-11-05 MED ORDER — OXYCODONE HCL 5 MG PO TABS
20.0000 mg | ORAL_TABLET | Freq: Once | ORAL | Status: AC
  Administered 2021-11-05: 20 mg via ORAL
  Filled 2021-11-05: qty 4

## 2021-11-05 MED ORDER — ACETAMINOPHEN 500 MG PO TABS
1000.0000 mg | ORAL_TABLET | Freq: Once | ORAL | Status: AC
  Administered 2021-11-05: 1000 mg via ORAL
  Filled 2021-11-05: qty 2

## 2021-11-05 NOTE — Discharge Summary (Signed)
[facility] Discharge Summary  ? ?Patient ID: ?Sandra Mckay ?MRN: [id number] ?DOB/AGE: March 15, 1996 26 y.o. ? ?Admit date: 11/05/2021 ?Discharge date: 11/05/2021 ? ?Primary Care Physician:  Vevelyn Francois, NP ? ?Admission Diagnoses:  ?Principal Problem: ?  Sickle cell pain crisis (Sandra Mckay) ? ? ?Discharge Medications: ? ?Allergies as of 11/05/2021   ? ?   Reactions  ? Latex Rash  ? Wound Dressing Adhesive Rash  ? ?  ? ?  ?Medication List  ?  ? ?TAKE these medications   ? ?DULoxetine 20 MG capsule ?Commonly known as: CYMBALTA ?TAKE 2 CAPSULES BY MOUTH EVERY DAY ?  ?folic acid 1 MG tablet ?Commonly known as: FOLVITE ?Take 1 tablet (1 mg total) by mouth daily. ?  ?hydroxyurea 500 MG capsule ?Commonly known as: HYDREA ?TAKE 3 CAPSULES (1,500 MG TOTAL) BY MOUTH DAILY. TAKE 3 CAPSULES (1,500 MG TOTAL) BY MOUTH DAILY. ?  ?ibuprofen 800 MG tablet ?Commonly known as: ADVIL ?Take 1 tablet (800 mg total) by mouth every 8 (eight) hours as needed. ?What changed: reasons to take this ?  ?ketorolac 10 MG tablet ?Commonly known as: TORADOL ?Take 10 mg by mouth every 6 (six) hours as needed for pain. ?  ?Melatonin 10 MG Caps ?Take 20 mg by mouth at bedtime as needed. ?  ?ondansetron 4 MG tablet ?Commonly known as: Zofran ?Take 1 tablet (4 mg total) by mouth every 8 (eight) hours as needed for nausea or vomiting. ?  ?oxyCODONE 15 MG immediate release tablet ?Commonly known as: ROXICODONE ?Take 1 tablet (15 mg total) by mouth every 4 (four) hours as needed for up to 15 days for pain. ?  ?QUEtiapine 25 MG tablet ?Commonly known as: SEROquel ?Take 1 tablet (25 mg total) by mouth at bedtime. ?  ?tiZANidine 4 MG tablet ?Commonly known as: ZANAFLEX ?Take 1 tablet (4 mg total) by mouth 3 (three) times daily. ?What changed:  ?when to take this ?reasons to take this ?  ? ?  ? ? ? ?Consults:  None ? ?Significant Diagnostic Studies:  ?DG HIPS BILAT WITH PELVIS 3-4 VIEWS ? ?Result Date: 10/29/2021 ?CLINICAL DATA:  Chronic bilateral hip pain  without trauma EXAM: DG HIP (WITH OR WITHOUT PELVIS) 3-4V BILAT COMPARISON:  None. FINDINGS: Femoral heads are located. Sacroiliac joints are symmetric. Joint spaces maintained. No acute fracture or dislocation. No focal osseous lesion. IMPRESSION: No acute osseous abnormality. Electronically Signed   By: Abigail Miyamoto M.D.   On: 10/29/2021 13:28   ? ?History of present illness: ?Sandra Mckay is a 26 year old female with a medical history significant for sickle cell disease, chronic pain syndrome, opiate dependence and tolerance, anemia of chronic disease, and history of polysubstance abuse presents with complaints of generalized pain that is consistent with her previous pain crisis.  Patient states that pain intensity has been elevated over the past several days.  Patient is out of her home medication and has been unable to eat get her prescription renewed for oxycodone.  She rates her pain at 8/10, constant, and throbbing.  No fever, chills, headache, or dizziness.  No urinary symptoms, nausea, vomiting, or diarrhea. ? ?[facility] Course: ?Patient admitted to sickle cell day clinic for management of pain crisis. ?Reviewed all laboratory values, UDS positive for benzodiazepines and marijuana ?All other laboratory values consistent with patient's baseline ?Care plan initiated: ?Dilaudid 2 mg SQ x3 ?Tylenol 1000 mg x 1 ?Toradol 15 mg IV x1 ?IV fluids, 0.45% saline at 100 mL/h ?Oxycodone 10  mg x 1 ?Patient's pain intensity decreased to 6/10 and she is requesting discharge home.  She is alert, oriented, and ambulating without assistance. ? ?Physical Exam at Discharge: ? ?BP 105/64 (BP Location: Right Arm)   Pulse 61   Temp 97.8 ?F (36.6 ?C) (Temporal)   Resp 16   LMP 11/05/2021   SpO2 100%  ? ?Physical Exam ?Constitutional:   ?   Appearance: Normal appearance.  ?HENT:  ?   Mouth/Throat:  ?   Mouth: Mucous membranes are moist.  ?   Pharynx: Oropharynx is clear.  ?Eyes:  ?   Pupils: Pupils are  equal, round, and reactive to light.  ?Pulmonary:  ?   Effort: Pulmonary effort is normal.  ?Abdominal:  ?   General: Abdomen is flat. Bowel sounds are normal.  ?Neurological:  ?   Mental Status: She is alert.  ?Psychiatric:     ?   Mood and Affect: Mood normal.     ?   Behavior: Behavior normal.     ?   Thought Content: Thought content normal.     ?   Judgment: Judgment normal.  ?  ?Discharge instructions: ?Resume all home medications.  ? ?Follow up with PCP as previously  scheduled.  ? ?Discussed the importance of drinking 64 ounces of water daily, dehydration of red blood cells may lead further sickling.  ? ?Avoid all stressors that precipitate sickle cell pain crisis.   ? ? ?The patient was given clear instructions to go to ER or return to medical center if symptoms do not improve, worsen or new problems develop.  ? ? ?Disposition at Discharge: ?Discharge disposition: 01-Home or Self Care ? ? ? ? ? ? ?Discharge Orders: ?Discharge Instructions   ? ? Discharge patient   Complete by: As directed ?  ? Discharge disposition: 01-Home or Self Care  ? Discharge patient date: 11/05/2021  ? ?  ? ? ?Condition at Discharge:   Stable ? ?Time spent on Discharge:  Greater than 30 minutes. ? ?Signed: ?[name]  APRN, MSN, FNP-C ?Patient [place] ?[facility] ?[address]  ?[place], [place] [address] ?[phone] ? ?11/05/2021, 2:38 PM ? ?

## 2021-11-05 NOTE — Progress Notes (Signed)
Patient admitted to the day infusion hospital for sickle cell pain. Initially, patient reported back and bilateral leg pain rated 8/10. For pain management, patient given 2 mg Dilaudid sub-q x 3 every 1 hour, Tylenol, Toradol, Oxycodone and patient hydrated with IV fluids. At discharge, patient rated pain at 6/10. Vital signs stable. Discharge instructions given. Patient alert, oriented and ambulatory at discharge.   ?

## 2021-11-05 NOTE — H&P (Signed)
?[facility] History and Physical  ? ?Date: 11/05/2021 ? ?Patient name: Shanyla Marconi Medical record number: [id number] ?Date of birth: Apr 25, 1996 Age: 26 y.o. Gender: female ?PCP: Vevelyn Francois, NP ? ?Attending physician: Tresa Garter, MD ? ?Chief Complaint: Sickle cell pain  ? ?History of Present Illness: ?Ramey Ketcherside is a 26 year old female with a medical history significant for sickle cell disease, chronic pain syndrome, opiate dependence and tolerance, anemia of chronic disease, and history of polysubstance abuse presents with complaints of generalized pain that is consistent with her previous pain crisis.  Patient states that pain intensity has been elevated over the past several days.  Patient is out of her home medication and has been unable to eat get her prescription renewed for oxycodone.  She rates her pain at 8/10, constant, and throbbing.  No fever, chills, headache, or dizziness.  No urinary symptoms, nausea, vomiting, or diarrhea. ? ?Meds: ?Medications Prior to Admission  ?Medication Sig Dispense Refill Last Dose  ? DULoxetine (CYMBALTA) 20 MG capsule TAKE 2 CAPSULES BY MOUTH EVERY DAY (Patient not taking: Reported on 10/29/2021) 180 capsule 1   ? folic acid (FOLVITE) 1 MG tablet Take 1 tablet (1 mg total) by mouth daily. (Patient not taking: Reported on 08/06/2021) 90 tablet 3   ? hydroxyurea (HYDREA) 500 MG capsule TAKE 3 CAPSULES (1,500 MG TOTAL) BY MOUTH DAILY. TAKE 3 CAPSULES (1,500 MG TOTAL) BY MOUTH DAILY. 270 capsule 1   ? ibuprofen (ADVIL) 800 MG tablet Take 1 tablet (800 mg total) by mouth every 8 (eight) hours as needed. (Patient taking differently: Take 800 mg by mouth every 8 (eight) hours as needed for mild pain.) 60 tablet 6   ? ketorolac (TORADOL) 10 MG tablet Take 10 mg by mouth every 6 (six) hours as needed for pain.     ? Melatonin 10 MG CAPS Take 20 mg by mouth at bedtime as needed. (Patient not taking: Reported on 10/29/2021) 60 capsule 2   ? ondansetron  (ZOFRAN) 4 MG tablet Take 1 tablet (4 mg total) by mouth every 8 (eight) hours as needed for nausea or vomiting. 20 tablet 5   ? oxyCODONE (ROXICODONE) 15 MG immediate release tablet Take 1 tablet (15 mg total) by mouth every 4 (four) hours as needed for up to 15 days for pain. 90 tablet 0   ? QUEtiapine (SEROQUEL) 25 MG tablet Take 1 tablet (25 mg total) by mouth at bedtime. 90 tablet 3   ? tiZANidine (ZANAFLEX) 4 MG tablet Take 1 tablet (4 mg total) by mouth 3 (three) times daily. (Patient taking differently: Take 4 mg by mouth every 8 (eight) hours as needed for muscle spasms.) 90 tablet 11   ? ? ?Allergies: ?Latex and Wound dressing adhesive ?Past Medical History:  ?Diagnosis Date  ? Blood transfusion without reported diagnosis   ? Bronchitis   ? Chickenpox   ? Depression   ? Elevated ferritin level 05/2019  ? Heart murmur   ? Pulmonary hypertension ([place])   ? Sickle cell anemia ([facility])   ? Sickle cell disease, type SS ([place])   ? Thrombocytosis 05/2019  ? Urinary tract infection   ? Vitamin D deficiency   ? ?Past Surgical History:  ?Procedure Laterality Date  ? CHOLECYSTECTOMY  2011  ? EYE SURGERY    ? Sty removal  ? [place]  ? 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 TONSILLECTOMY  2012  ? ?  Family History  ?Problem Relation Age of Onset  ? Arthritis Other   ?     grandparent  ? Stroke Other   ? Hypertension Other   ? Diabetes Other   ?     grandparent  ? Cancer - Other Other   ?     Glioblastoma  ? ?Social History  ? ?Socioeconomic History  ? Marital status: Single  ?  Spouse name: Not on file  ? Number of children: Not on file  ? Years of education: Not on file  ? Highest education level: Not on file  ?Occupational History  ? Not on file  ?Tobacco Use  ? Smoking status: Some Days  ?  Packs/day: 1.00  ?  Types: Cigarettes  ? Smokeless tobacco: Never  ? Tobacco comments:  ?  1 pack twice a week  ?Vaping Use  ? Vaping Use: Never used  ?Substance and Sexual Activity   ? Alcohol use: Yes  ?  Alcohol/week: 0.0 standard drinks  ?  Comment: occ  ? Drug use: No  ? Sexual activity: Yes  ?Other Topics Concern  ? Not on file  ?Social History Narrative  ? Lives with mom in a one story home.  Education: 2 years of college.   ? ?Social Determinants of Health  ? ?Financial Resource Strain: Not on file  ?Food Insecurity: Not on file  ?Transportation Needs: Not on file  ?Physical Activity: Not on file  ?Stress: Not on file  ?Social Connections: Not on file  ?Intimate Partner Violence: Not on file  ? ?Review of Systems  ?Constitutional: Negative.   ?HENT: Negative.    ?Eyes: Negative.   ?Cardiovascular: Negative.   ?Gastrointestinal: Negative.   ?Genitourinary: Negative.   ?Musculoskeletal:  Positive for back pain and joint pain.  ?Skin: Negative.   ?Neurological: Negative.   ?Psychiatric/Behavioral: Negative.    ? ?Physical Exam: ?There were no vitals taken for this visit. ?Physical Exam ?Constitutional:   ?   Appearance: Normal appearance.  ?Eyes:  ?   Pupils: Pupils are equal, round, and reactive to light.  ?Cardiovascular:  ?   Rate and Rhythm: Normal rate and regular rhythm.  ?Pulmonary:  ?   Effort: Pulmonary effort is normal.  ?   Breath sounds: Normal breath sounds.  ?Abdominal:  ?   General: Bowel sounds are normal.  ?Musculoskeletal:     ?   General: Normal range of motion.  ?Skin: ?   General: Skin is warm.  ?Neurological:  ?   General: No focal deficit present.  ?   Mental Status: She is alert. Mental status is at baseline.  ?Psychiatric:     ?   Mood and Affect: Mood normal.     ?   Behavior: Behavior normal.     ?   Thought Content: Thought content normal.     ?   Judgment: Judgment normal.  ? ? ? ?Lab results: ?No results found for this or any previous visit (from the past 24 hour(s)). ? ?Imaging results:  ?No results found. ? ? ?Assessment & Plan: ?Patient admitted to sickle cell day infusion center for management of pain crisis.  ?Patient is opiate tolerant ?Dilaudid 2 mg SQ  x3 ?IV fluids, 0.45% saline at 100 mL/h ?Oxycodone 20 mg x 1 ?Toradol 15 mg IV times one dose ?Tylenol 1000 mg by mouth times one dose ?Review CBC with differential, complete metabolic panel, and reticulocytes as results become available.  ?Pain intensity will be reevaluated in  context of functioning and relationship to baseline as care progresses ?If pain intensity remains elevated and/or sudden change in hemodynamic stability transition to inpatient services for higher level of care.  ? ?  ?[name]  APRN, MSN, FNP-C ?Patient [place] ?[facility] ?[address]  ?[place], [place] [address] ?[phone] ? ? ?11/05/2021, 9:44 AM ? ?

## 2021-11-05 NOTE — BH Specialist Note (Signed)
Integrated Behavioral Health ?General Follow Up Note ? ?11/05/2021 ?Name: Everlean Bucher MRN: [id number] DOB: 12/22/1995 ?Arraya Buck is a 25 y.o. year old female who sees Vevelyn Francois, NP for primary care. LCSW was initially consulted to assist the patient with Mental Health Counseling and Resources. ? ?Interpreter: No.   Interpreter Name & Language: none ? ?Assessment: Patient experiencing bipolar disorder and anxiety. ? ?Ongoing Intervention: [name] met with patient at bedside in the day hospital. Brief supportive counseling. [name] to continue following outpatient. ? ?Review of patient status, including review of consultants reports, relevant laboratory and other test results, and collaboration with appropriate care team members and the patient's provider was performed as part of comprehensive patient evaluation and provision of services.   ? ?[name], LCSW ?Patient [place] ?[place] ?[phone] ?  ? ?

## 2021-11-05 NOTE — Telephone Encounter (Signed)
Patient arrived in lobby of the patient care center,  complains of pain in back and legs rated 8/10. Denied chest pain, abd pain, fever, N/V/D. Wants to come in for treatment. Last took '15mg'$  Oxycodone last night  at midnight, and has since ran out of home medications, states she knows she needs to provide a UDS before provider will refill prescription. Pt states she was in the ED two days ago. Pt states her mother is her transportation today. Pt denies exposure to anyone covid positive in the last two weeks, denies flu like symptoms today. [place] FNP notified, pt approved to be seen in the day hospital, however pt has to provide sample for UDS and will receive SQ injections of pain medication today. Reviewed care plan with pt, agreeable to plan and verbalized understanding.    ?

## 2021-11-11 ENCOUNTER — Other Ambulatory Visit: Payer: Self-pay | Admitting: Internal Medicine

## 2021-11-11 DIAGNOSIS — D571 Sickle-cell disease without crisis: Secondary | ICD-10-CM

## 2021-11-11 DIAGNOSIS — Z79891 Long term (current) use of opiate analgesic: Secondary | ICD-10-CM

## 2021-11-11 DIAGNOSIS — G894 Chronic pain syndrome: Secondary | ICD-10-CM

## 2021-11-11 MED ORDER — OXYCODONE HCL 15 MG PO TABS
15.0000 mg | ORAL_TABLET | ORAL | 0 refills | Status: DC | PRN
Start: 2021-11-11 — End: 2021-12-01

## 2021-11-17 ENCOUNTER — Ambulatory Visit: Payer: Self-pay | Admitting: Nurse Practitioner

## 2021-11-18 ENCOUNTER — Encounter: Payer: Self-pay | Admitting: Nurse Practitioner

## 2021-11-18 ENCOUNTER — Ambulatory Visit (INDEPENDENT_AMBULATORY_CARE_PROVIDER_SITE_OTHER): Payer: Medicaid Other | Admitting: Nurse Practitioner

## 2021-11-18 ENCOUNTER — Other Ambulatory Visit: Payer: Self-pay | Admitting: Internal Medicine

## 2021-11-18 VITALS — BP 118/77 | HR 88 | Temp 98.3°F | Ht 63.0 in | Wt 130.1 lb

## 2021-11-18 DIAGNOSIS — F32A Depression, unspecified: Secondary | ICD-10-CM | POA: Diagnosis not present

## 2021-11-18 DIAGNOSIS — D571 Sickle-cell disease without crisis: Secondary | ICD-10-CM

## 2021-11-18 DIAGNOSIS — F1490 Cocaine use, unspecified, uncomplicated: Secondary | ICD-10-CM

## 2021-11-18 DIAGNOSIS — F419 Anxiety disorder, unspecified: Secondary | ICD-10-CM

## 2021-11-18 NOTE — Progress Notes (Signed)
Integrated [facility]al [facility] ?General Follow Up Note ? ?11/18/2021 ?Name: Sandra Mckay MRN: [id number] DOB: Jun 14, 1996 ?Sandra Mckay is a 26 y.o. year old female who sees Vevelyn Francois, NP for primary care. LCSW was initially consulted to assist the patient with Mental Health Counseling and Resources. ? ?Interpreter: No.   Interpreter Name & Language: none ? ?Assessment: Patient experiencing bipolar disorder and anxiety. [name] follows for Integrated Behavioral Health (IBH) counseling. ? ?Ongoing Intervention: Today [name] met with patient briefly during PCP visit to review and sign IBH treatment plan.  ? ?Review of patient status, including review of consultants reports, relevant laboratory and other test results, and collaboration with appropriate care team members and the patient's provider was performed as part of comprehensive patient evaluation and provision of services.   ? ?[name], LCSW ?Patient [place] ?[place] ?[phone] ?  ? ?

## 2021-11-18 NOTE — Patient Instructions (Signed)
You were seen today in the [facility] for reevaluation of sickle cell disease. Labs were collected, results will be available via MyChart or, if abnormal, you will be contacted by clinic staff. You were prescribed medications, please take as directed. Please follow up in 2 mths for reevaluation.  ?
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
## 2021-11-25 ENCOUNTER — Telehealth: Payer: Self-pay

## 2021-11-25 NOTE — Telephone Encounter (Signed)
Oxycodone  °

## 2021-12-01 ENCOUNTER — Other Ambulatory Visit: Payer: Self-pay | Admitting: Internal Medicine

## 2021-12-01 DIAGNOSIS — G894 Chronic pain syndrome: Secondary | ICD-10-CM

## 2021-12-01 DIAGNOSIS — D571 Sickle-cell disease without crisis: Secondary | ICD-10-CM

## 2021-12-01 DIAGNOSIS — Z79891 Long term (current) use of opiate analgesic: Secondary | ICD-10-CM

## 2021-12-01 MED ORDER — OXYCODONE HCL 15 MG PO TABS: 15.0000 mg | ORAL_TABLET | ORAL | 0 refills | Status: DC | PRN

## 2021-12-03 ENCOUNTER — Ambulatory Visit (INDEPENDENT_AMBULATORY_CARE_PROVIDER_SITE_OTHER): Payer: Medicaid Other | Admitting: Clinical

## 2021-12-03 ENCOUNTER — Other Ambulatory Visit: Payer: Medicaid Other

## 2021-12-03 DIAGNOSIS — Z79891 Long term (current) use of opiate analgesic: Secondary | ICD-10-CM

## 2021-12-03 DIAGNOSIS — F419 Anxiety disorder, unspecified: Secondary | ICD-10-CM

## 2021-12-03 DIAGNOSIS — F319 Bipolar disorder, unspecified: Secondary | ICD-10-CM

## 2021-12-03 DIAGNOSIS — D571 Sickle-cell disease without crisis: Secondary | ICD-10-CM
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
## 2021-12-10 LAB — DRUG SCREEN 764883 11+OXYCO+ALC+CRT-BUND
Amphetamines, Urine: NEGATIVE ng/mL
BENZODIAZ UR QL: NEGATIVE ng/mL
Barbiturate: NEGATIVE ng/mL
Cocaine (Metabolite): NEGATIVE ng/mL
Creatinine: 100.8 mg/dL (ref 20.0–300.0)
Ethanol: NEGATIVE %
Meperidine: NEGATIVE ng/mL
Methadone Screen, Urine: NEGATIVE ng/mL
Phencyclidine: NEGATIVE ng/mL
Propoxyphene: NEGATIVE ng/mL
Tramadol: NEGATIVE ng/mL
pH, Urine: 6.4 (ref 4.5–8.9)

## 2021-12-10 LAB — CANNABINOID CONFIRMATION, UR
CANNABINOIDS: POSITIVE — AB
Carboxy THC GC/MS Conf: 371 ng/mL

## 2021-12-10 LAB — OXYCODONE/OXYMORPHONE, CONFIRM
OXYCODONE/OXYMORPH: POSITIVE — AB
OXYCODONE: >3000 ng/mL
OXYCODONE: POSITIVE — AB
OXYMORPHONE (GC/MS): >3000 ng/mL
OXYMORPHONE: POSITIVE — AB

## 2021-12-10 LAB — OPIATES CONFIRMATION, URINE: Opiates: NEGATIVE ng/mL

## 2021-12-12 ENCOUNTER — Encounter: Payer: Self-pay | Admitting: Clinical

## 2021-12-15 ENCOUNTER — Other Ambulatory Visit: Payer: Self-pay

## 2021-12-15 DIAGNOSIS — Z79891 Long term (current) use of opiate analgesic: Secondary | ICD-10-CM

## 2021-12-15 DIAGNOSIS — D571 Sickle-cell disease without crisis: Secondary | ICD-10-CM

## 2021-12-15 DIAGNOSIS — G894 Chronic pain syndrome: Secondary | ICD-10-CM

## 2021-12-16 ENCOUNTER — Ambulatory Visit (INDEPENDENT_AMBULATORY_CARE_PROVIDER_SITE_OTHER): Payer: Medicaid Other | Admitting: Clinical

## 2021-12-16 DIAGNOSIS — F319 Bipolar disorder, unspecified: Secondary | ICD-10-CM

## 2021-12-16 DIAGNOSIS — F419 Anxiety disorder, unspecified: Secondary | ICD-10-CM | POA: Diagnosis not present

## 2021-12-16 MED ORDER — OXYCODONE HCL 15 MG PO TABS: 15.0000 mg | ORAL_TABLET | ORAL | 0 refills | Status: DC | PRN
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
## 2021-12-30 ENCOUNTER — Other Ambulatory Visit: Payer: Self-pay | Admitting: Nurse Practitioner

## 2021-12-30 ENCOUNTER — Telehealth: Payer: Self-pay

## 2021-12-30 DIAGNOSIS — D571 Sickle-cell disease without crisis: Secondary | ICD-10-CM

## 2021-12-30 DIAGNOSIS — G894 Chronic pain syndrome: Secondary | ICD-10-CM

## 2021-12-30 DIAGNOSIS — Z79891 Long term (current) use of opiate analgesic: Secondary | ICD-10-CM

## 2021-12-30 MED ORDER — OXYCODONE HCL 15 MG PO TABS: 15.0000 mg | ORAL_TABLET | ORAL | 0 refills | Status: DC | PRN

## 2021-12-30 NOTE — Telephone Encounter (Signed)
Oxycodone  °

## 2022-01-01 ENCOUNTER — Encounter: Payer: Self-pay | Admitting: Clinical

## 2022-01-05 ENCOUNTER — Ambulatory Visit (INDEPENDENT_AMBULATORY_CARE_PROVIDER_SITE_OTHER): Payer: Medicaid Other | Admitting: Clinical

## 2022-01-05 DIAGNOSIS — F319 Bipolar disorder, unspecified: Secondary | ICD-10-CM

## 2022-01-05 DIAGNOSIS — F419 Anxiety disorder, unspecified: Secondary | ICD-10-CM
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
## 2022-01-14 ENCOUNTER — Encounter: Payer: Self-pay | Admitting: Nurse Practitioner

## 2022-01-14 ENCOUNTER — Ambulatory Visit (INDEPENDENT_AMBULATORY_CARE_PROVIDER_SITE_OTHER): Payer: Medicaid Other | Admitting: Nurse Practitioner

## 2022-01-14 ENCOUNTER — Telehealth: Payer: Self-pay | Admitting: Nurse Practitioner

## 2022-01-14 ENCOUNTER — Ambulatory Visit: Payer: Medicaid Other | Admitting: Clinical

## 2022-01-14 VITALS — BP 110/75 | HR 75 | Temp 97.3°F | Ht 63.0 in | Wt 132.2 lb

## 2022-01-14 DIAGNOSIS — D571 Sickle-cell disease without crisis: Secondary | ICD-10-CM | POA: Diagnosis not present

## 2022-01-14 DIAGNOSIS — G894 Chronic pain syndrome: Secondary | ICD-10-CM | POA: Diagnosis not present

## 2022-01-14 DIAGNOSIS — Z79891 Long term (current) use of opiate analgesic: Secondary | ICD-10-CM

## 2022-01-14 MED ORDER — OXYCODONE HCL 15 MG PO TABS: 15.0000 mg | ORAL_TABLET | Freq: Four times a day (QID) | ORAL | 0 refills | Status: DC | PRN

## 2022-01-14 MED ORDER — XTAMPZA ER 9 MG PO C12A: 1.0000 | EXTENDED_RELEASE_CAPSULE | Freq: Two times a day (BID) | ORAL | 0 refills | Status: DC | PRN

## 2022-01-14 NOTE — Progress Notes (Signed)
[place], [place]  [address] Phone:  [phone]   Fax:  [phone] Subjective:   Patient ID: Sandra Mckay, female    DOB: 02-04-1996, 26 y.o.   MRN: [id number]  Chief Complaint  Patient presents with   Follow-up    Pt is here for 2  month follow up. Pt stated she was told she was pre diabetic  when she was in the hospital. Pt is requesting long acting pain medications for sickle cell crisis also need refill on medications. Pt would like to increase SEROQUEL dosage   HPI Sandra Mckay 26 y.o. 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 To the [facility] for SCD follow up.   States that she was in the hospital last week due to crisis, admitted for 2 days and required a transfusion. Suspects that crisis was triggered by her menstrual cycle. Endorsed having all over body pain, which has now resolved.   Requesting change in pain medications to assist in managing crisis. Denies taking supplements at this time, otherwise compliant with established treatment plan. Denies any other concerns today.  Denies any fatigue, chest pain, shortness of breath, HA or dizziness. Denies any blurred vision, numbness or tingling.   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   TONSILLECTOMY  2012    Family History  Problem Relation Age of Onset   Arthritis Other        grandparent   Stroke Other    Hypertension Other    Diabetes Other        grandparent   Cancer - Other Other        Glioblastoma    Social History   Socioeconomic History   Marital status: Single    Spouse name: Not on file   Number of children: Not on file   Years of education: Not on file   Highest education level: Not on file  Occupational History   Not on file  Tobacco Use   Smoking status: Some Days    Packs/day: 1.00    Types: Cigarettes   Smokeless tobacco: Never   Tobacco comments:    1 pack twice a week  Vaping Use   Vaping Use: Never used  Substance and Sexual Activity   Alcohol use: Yes    Alcohol/week: 0.0 standard drinks of alcohol    Comment: occ   Drug use: No   Sexual activity: Yes  Other Topics Concern   Not on file  Social History Narrative   Lives with mom in a one story home.  Education: 2 years of  college.    Social Determinants of Health   Financial Resource Strain: Not on file  Food Insecurity: Not on file  Transportation Needs: Not on file  Physical Activity: Not on file  Stress: Not on file  Social Connections: Not on file  Intimate Partner Violence: Not on file    Outpatient Medications Prior to Visit  Medication Sig Dispense Refill   DULoxetine (CYMBALTA) 20 MG capsule TAKE 2 CAPSULES BY MOUTH EVERY DAY 180 capsule 1   hydroxyurea (HYDREA) 500 MG capsule TAKE 3 CAPSULES (1,500 MG TOTAL) BY MOUTH DAILY. TAKE 3 CAPSULES (1,500 MG TOTAL) BY MOUTH DAILY. 270 capsule 1   ibuprofen (ADVIL) 800 MG tablet Take 1 tablet (800 mg total) by mouth every 8 (eight) hours as needed. (Patient taking differently: Take 800 mg by mouth every 8 (eight) hours as needed for mild pain.) 60 tablet 6   Melatonin 10 MG CAPS Take  20 mg by mouth at bedtime as needed. 60 capsule 2   ondansetron (ZOFRAN) 4 MG tablet Take 1 tablet (4 mg total) by mouth every 8 (eight) hours as needed for nausea or vomiting. 20 tablet 5   QUEtiapine (SEROQUEL) 25 MG tablet Take 1 tablet (25 mg total) by mouth at bedtime. 90 tablet 3   tiZANidine (ZANAFLEX) 4 MG tablet Take 1 tablet (4 mg total) by mouth 3 (three) times daily. (Patient taking differently: Take 4 mg by mouth every 8 (eight) hours as needed for muscle spasms.) 90 tablet 11   oxyCODONE (ROXICODONE) 15 MG immediate release tablet Take 1 tablet (15 mg total) by mouth every 4 (four) hours as needed for up to 15 days for pain. 90 tablet 0   folic acid (FOLVITE) 1 MG tablet Take 1 tablet (1 mg total) by mouth daily. (Patient not taking: Reported on 08/06/2021) 90 tablet 3   ketorolac (TORADOL) 10 MG tablet Take 10 mg by mouth every 6 (six) hours as needed for pain. (Patient not taking: Reported on 11/18/2021)     No facility-administered medications prior to visit.    Allergies  Allergen Reactions   Latex Rash   Wound Dressing Adhesive Rash    Review of Systems  Constitutional:  Negative for chills, fever and malaise/fatigue.  Respiratory:  Negative for cough and shortness of breath.   Cardiovascular:  Negative for chest pain, palpitations and leg swelling.  Gastrointestinal:  Negative for abdominal pain, blood in stool, constipation, diarrhea, nausea and vomiting.  Musculoskeletal: Negative.   Skin: Negative.   Neurological: Negative.   Psychiatric/Behavioral:  Negative for depression. The patient is not nervous/anxious.   All other systems reviewed and are negative.      Objective:    Physical Exam Vitals reviewed.  Constitutional:      General: She is not in acute distress.    Appearance: Normal appearance.  HENT:     Head: Normocephalic.  Cardiovascular:     Rate and Rhythm: Normal rate and regular rhythm.     Pulses: Normal pulses.     Heart sounds: Normal heart  sounds.     Comments: No obvious peripheral edema Pulmonary:     Effort: Pulmonary effort is normal.     Breath sounds: Normal breath sounds.  Musculoskeletal:        General: No swelling, tenderness, deformity or signs of injury. Normal range of motion.     Right lower leg: No edema.     Left lower leg: No edema.  Skin:    General: Skin  is warm and dry.     Capillary Refill: Capillary refill takes less than 2 seconds.  Neurological:     General: No focal deficit present.     Mental Status: She is alert and oriented to person, place, and time.  Psychiatric:        Mood and Affect: Mood normal.        Behavior: Behavior normal.        Thought Content: Thought content normal.        Judgment: Judgment normal.     BP 110/75 (BP Location: Right Arm, Patient Position: Sitting, Cuff Size: Normal)   Pulse 75   Temp (!) 97.3 F (36.3 C)   Ht 5' 3" (1.6 m)   Wt 132 lb 3.2 oz (60 kg)   SpO2 98%   BMI 23.42 kg/m  Wt Readings from Last 3 Encounters:  01/14/22 132 lb 3.2 oz (60 kg)  11/18/21 130 lb 2 oz (59 kg)  10/28/21 130 lb (59 kg)    Immunization History  Administered Date(s) Administered   DT (Pediatric) 05/30/2012   Hepatitis A 05/10/2007, 12/27/2007   Hepatitis A, Ped/Adol-2 Dose 05/10/2007, 12/27/2007   Influenza,inj,Quad PF,6+ Mos 05/28/2014, 07/22/2015, 06/12/2016, 03/23/2018   Influenza-Unspecified 05/28/2014, 07/22/2015, 06/12/2016, 03/23/2018   MMR 08/29/2012   Meningococcal Conjugate 09/30/2011, 08/29/2012, 09/06/2018, 09/06/2018   Pneumococcal Conjugate-13 01/28/2010   Pneumococcal Polysaccharide-23 09/30/2011, 08/29/2012, 09/06/2018   Pneumococcal-Unspecified 01/28/2010   Tdap 11/17/2018   Varicella 01/09/2019    Diabetic Foot Exam - Simple   No data filed     No results found for: "TSH" Lab Results  Component Value Date   WBC 9.6 01/14/2022   HGB 9.5 (L) 01/14/2022   HCT 26.9 (L) 01/14/2022   MCV 101 (H) 01/14/2022   PLT 452 (H) 01/14/2022    Lab Results  Component Value Date   NA 138 01/14/2022   K 4.8 01/14/2022   CO2 20 01/14/2022   GLUCOSE 63 (L) 01/14/2022   BUN 9 01/14/2022   CREATININE 0.49 (L) 01/14/2022   BILITOT 2.6 (H) 01/14/2022   ALKPHOS 104 01/14/2022   AST 47 (H) 01/14/2022   ALT 30 01/14/2022   PROT 7.8 01/14/2022   ALBUMIN 5.1 (H) 01/14/2022   CALCIUM 9.5 01/14/2022   ANIONGAP 6 11/05/2021   EGFR 132 01/14/2022   No results found for: "CHOL" No results found for: "HDL" No results found for: "LDLCALC" No results found for: "TRIG" No results found for: "CHOLHDL" No results found for: "HGBA1C"     Assessment & Plan:   Problem List Items Addressed This Visit       Other   Sickle cell anemia (HCC)   Relevant Medications   oxyCODONE (ROXICODONE) 15 MG immediate release tablet   Other Relevant Orders   CBC with Differential/Platelet (Completed)   Comprehensive metabolic panel (Completed)   Folate (Completed)   Vitamin D, 25-hydroxy (Completed) Encouraged continued diet and exercise efforts  Encouraged continued compliance with medication     Hb-SS disease without crisis ([place]) - Primary   Relevant Medications   oxyCODONE (ROXICODONE) 15 MG immediate release tablet   Other Relevant Orders   [id number] 11+Oxyco+Alc+Crt-Bund (Completed)   Chronic pain   Relevant Medications   oxyCODONE ER (XTAMPZA ER) 9 MG C12A   oxyCODONE (ROXICODONE) 15 MG immediate release tablet   Other Visit Diagnoses     Chronic prescription opiate use       Relevant Medications   oxyCODONE ER (XTAMPZA ER) 9 MG C12A  oxyCODONE (ROXICODONE) 15 MG immediate release tablet   Follow up in 3 mths for reevaluation of SCD, sooner as needed     I am having Sandra Mckay start on [place] ER. I am also having her maintain her folic acid, ibuprofen, ondansetron, QUEtiapine, tiZANidine, Melatonin, DULoxetine, hydroxyurea, ketorolac, and oxyCODONE.  Meds ordered this encounter  Medications   DISCONTD: oxyCODONE  (ROXICODONE) 15 MG immediate release tablet    Sig: Take 1 tablet (15 mg total) by mouth every 6 (six) hours as needed for up to 15 days for pain.    Dispense:  60 tablet    Refill:  0   oxyCODONE ER (XTAMPZA ER) 9 MG C12A    Sig: Take 1 tablet by mouth every 12 (twelve) hours as needed for up to 14 days.    Dispense:  28 capsule    Refill:  0   DISCONTD: oxyCODONE (ROXICODONE) 15 MG immediate release tablet    Sig: Take 1 tablet (15 mg total) by mouth every 6 (six) hours as needed for up to 15 days for pain.    Dispense:  60 tablet    Refill:  0   oxyCODONE (ROXICODONE) 15 MG immediate release tablet    Sig: Take 1 tablet (15 mg total) by mouth every 6 (six) hours as needed for up to 15 days for pain.    Dispense:  60 tablet    Refill:  0     Teena Dunk, NP

## 2022-01-14 NOTE — Patient Instructions (Signed)
You were seen today in the PCC for reevaluation of SCD. Labs were collected, results will be available via MyChart or, if abnormal, you will be contacted by clinic staff. You were prescribed medications, please take as directed. Please follow up in 3 mths for reevaluation of SCD 

## 2022-01-14 NOTE — Telephone Encounter (Signed)
Prescription sent to [address] [place] [address] [facility]

## 2022-01-15 ENCOUNTER — Ambulatory Visit (INDEPENDENT_AMBULATORY_CARE_PROVIDER_SITE_OTHER): Payer: Medicaid Other | Admitting: Clinical

## 2022-01-15 DIAGNOSIS — F319 Bipolar disorder, unspecified: Secondary | ICD-10-CM

## 2022-01-15 DIAGNOSIS — F419 Anxiety disorder, unspecified: Secondary | ICD-10-CM

## 2022-01-15 LAB — COMPREHENSIVE METABOLIC PANEL
ALT: 30 IU/L (ref 0–32)
AST: 47 IU/L — ABNORMAL HIGH (ref 0–40)
Albumin/Globulin Ratio: 1.9 (ref 1.2–2.2)
Albumin: 5.1 g/dL — ABNORMAL HIGH (ref 3.9–5.0)
Alkaline Phosphatase: 104 IU/L (ref 44–121)
BUN/Creatinine Ratio: 18 (ref 9–23)
BUN: 9 mg/dL (ref 6–20)
Bilirubin Total: 2.6 mg/dL — ABNORMAL HIGH (ref 0.0–1.2)
CO2: 20 mmol/L (ref 20–29)
Calcium: 9.5 mg/dL (ref 8.7–10.2)
Chloride: 100 mmol/L (ref 96–106)
Creatinine, Ser: 0.49 mg/dL — ABNORMAL LOW (ref 0.57–1.00)
Globulin, Total: 2.7 g/dL (ref 1.5–4.5)
Glucose: 63 mg/dL — ABNORMAL LOW (ref 70–99)
Potassium: 4.8 mmol/L (ref 3.5–5.2)
Sodium: 138 mmol/L (ref 134–144)
Total Protein: 7.8 g/dL (ref 6.0–8.5)
eGFR: 132 mL/min/{1.73_m2} (ref >59)

## 2022-01-15 LAB — CBC WITH DIFFERENTIAL/PLATELET
Basophils Absolute: 0.1 10*3/uL (ref 0.0–0.2)
Basos: 1 %
EOS (ABSOLUTE): 0.1 10*3/uL (ref 0.0–0.4)
Eos: 1 %
Hematocrit: 26.9 % — ABNORMAL LOW (ref 34.0–46.6)
Hemoglobin: 9.5 g/dL — ABNORMAL LOW (ref 11.1–15.9)
Immature Grans (Abs): 0 10*3/uL (ref 0.0–0.1)
Immature Granulocytes: 0 %
Lymphocytes Absolute: 1.9 10*3/uL (ref 0.7–3.1)
Lymphs: 20 %
MCH: 35.7 pg — ABNORMAL HIGH (ref 26.6–33.0)
MCHC: 35.3 g/dL (ref 31.5–35.7)
MCV: 101 fL — ABNORMAL HIGH (ref 79–97)
Monocytes Absolute: 1.4 10*3/uL — ABNORMAL HIGH (ref 0.1–0.9)
Monocytes: 15 %
NRBC: 2 % — ABNORMAL HIGH (ref 0–0)
Neutrophils Absolute: 6.1 10*3/uL (ref 1.4–7.0)
Neutrophils: 63 %
Platelets: 452 10*3/uL — ABNORMAL HIGH (ref 150–450)
RBC: 2.66 x10E6/uL — CL (ref 3.77–5.28)
RDW: 22.5 % — ABNORMAL HIGH (ref 11.7–15.4)
WBC: 9.6 10*3/uL (ref 3.4–10.8)

## 2022-01-15 LAB — VITAMIN D 25 HYDROXY (VIT D DEFICIENCY, FRACTURES): Vit D, 25-Hydroxy: 7.1 ng/mL — ABNORMAL LOW (ref 30.0–100.0)

## 2022-01-15 LAB — FOLATE: Folate: 5.5 ng/mL (ref >3.0)
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
## 2022-01-16 ENCOUNTER — Other Ambulatory Visit: Payer: Self-pay | Admitting: Nurse Practitioner

## 2022-01-16 DIAGNOSIS — E559 Vitamin D deficiency, unspecified: Secondary | ICD-10-CM

## 2022-01-16 MED ORDER — VITAMIN D (ERGOCALCIFEROL) 1.25 MG (50000 UNIT) PO CAPS: 50000.0000 [IU] | ORAL_CAPSULE | ORAL | 0 refills | Status: DC

## 2022-01-19 LAB — CANNABINOID CONFIRMATION, UR
CANNABINOIDS: POSITIVE — AB
Carboxy THC GC/MS Conf: 97 ng/mL

## 2022-01-19 LAB — DRUG SCREEN 764883 11+OXYCO+ALC+CRT-BUND
Amphetamines, Urine: NEGATIVE ng/mL
BENZODIAZ UR QL: NEGATIVE ng/mL
Barbiturate: NEGATIVE ng/mL
Cocaine (Metabolite): NEGATIVE ng/mL
Creatinine: 54.1 mg/dL (ref 20.0–300.0)
Ethanol: NEGATIVE %
Meperidine: NEGATIVE ng/mL
Methadone Screen, Urine: NEGATIVE ng/mL
OPIATE SCREEN URINE: NEGATIVE ng/mL
Phencyclidine: NEGATIVE ng/mL
Propoxyphene: NEGATIVE ng/mL
Tramadol: NEGATIVE ng/mL
pH, Urine: 7.7 (ref 4.5–8.9)

## 2022-01-19 LAB — OXYCODONE/OXYMORPHONE, CONFIRM
OXYCODONE/OXYMORPH: POSITIVE — AB
OXYCODONE: NEGATIVE
OXYMORPHONE (GC/MS): 882 ng/mL
OXYMORPHONE: POSITIVE — AB

## 2022-01-21 ENCOUNTER — Ambulatory Visit: Payer: Self-pay | Admitting: Nurse Practitioner

## 2022-01-23 ENCOUNTER — Telehealth: Payer: Self-pay | Admitting: Nurse Practitioner

## 2022-01-23 ENCOUNTER — Other Ambulatory Visit: Payer: Self-pay | Admitting: Family Medicine

## 2022-01-23 DIAGNOSIS — Z79891 Long term (current) use of opiate analgesic: Secondary | ICD-10-CM

## 2022-01-23 DIAGNOSIS — G894 Chronic pain syndrome: Secondary | ICD-10-CM

## 2022-01-23 MED ORDER — XTAMPZA ER 9 MG PO C12A: 1.0000 | EXTENDED_RELEASE_CAPSULE | Freq: Two times a day (BID) | ORAL | 0 refills | Status: DC | PRN

## 2022-01-23 NOTE — Progress Notes (Signed)
Reviewed PDMP substance reporting system prior to prescribing opiate medications. No inconsistencies noted.  Meds ordered this encounter  Medications   oxyCODONE ER (XTAMPZA ER) 9 MG C12A    Sig: Take 1 tablet by mouth every 12 (twelve) hours as needed.    Dispense:  14 capsule    Refill:  0    Order Specific Question:   Supervising Provider    Answer:   [name] [[id number]]   [name]  APRN, MSN, FNP-C Patient Care [facility] [address] [place], [place] [address] [phone]

## 2022-01-23 NOTE — Telephone Encounter (Signed)
Xtampza refill was sent last week as a 7 day supply instead of a 14 day supply.  Pt requests new prescription be sent of 14 day supply.

## 2022-01-26 ENCOUNTER — Encounter: Payer: Self-pay | Admitting: Clinical

## 2022-01-27 ENCOUNTER — Other Ambulatory Visit: Payer: Self-pay | Admitting: Nurse Practitioner

## 2022-01-27 DIAGNOSIS — G894 Chronic pain syndrome: Secondary | ICD-10-CM

## 2022-01-27 DIAGNOSIS — Z79891 Long term (current) use of opiate analgesic: Secondary | ICD-10-CM

## 2022-01-27 MED ORDER — XTAMPZA ER 9 MG PO C12A: 1.0000 | EXTENDED_RELEASE_CAPSULE | Freq: Two times a day (BID) | ORAL | 0 refills | Status: DC | PRN

## 2022-01-28 ENCOUNTER — Telehealth: Payer: Self-pay

## 2022-01-28 ENCOUNTER — Other Ambulatory Visit: Payer: Self-pay | Admitting: Family Medicine

## 2022-01-28 DIAGNOSIS — D571 Sickle-cell disease without crisis: Secondary | ICD-10-CM

## 2022-01-28 DIAGNOSIS — Z79891 Long term (current) use of opiate analgesic: Secondary | ICD-10-CM

## 2022-01-28 DIAGNOSIS — G894 Chronic pain syndrome: Secondary | ICD-10-CM

## 2022-01-28 MED ORDER — OXYCODONE HCL 15 MG PO TABS
15.0000 mg | ORAL_TABLET | Freq: Four times a day (QID) | ORAL | 0 refills | Status: DC | PRN
Start: 2022-01-28 — End: 2022-01-30

## 2022-01-28 NOTE — Telephone Encounter (Signed)
Oxycodone 15 mg

## 2022-01-28 NOTE — Progress Notes (Signed)
Reviewed PDMP substance reporting system prior to prescribing opiate medications. No inconsistencies noted.  Meds ordered this encounter  Medications   oxyCODONE (ROXICODONE) 15 MG immediate release tablet    Sig: Take 1 tablet (15 mg total) by mouth every 6 (six) hours as needed for up to 15 days for pain.    Dispense:  60 tablet    Refill:  0    Order Specific Question:   Supervising Provider    Answer:   [name], [name]   Dejanee Thibeaux Moore Shakesha Soltau  APRN, MSN, FNP-C [facility] [address]  [place], [place] [address] [phone]  

## 2022-01-29 ENCOUNTER — Telehealth: Payer: Self-pay

## 2022-01-29 NOTE — Telephone Encounter (Signed)
Xtampza Oxycodone     [facility]   [facility] is the pharmacy she will USE from now on!!!  [facility]   [address] [place] Ser [phone]

## 2022-01-30 ENCOUNTER — Telehealth: Payer: Self-pay | Admitting: Family Medicine

## 2022-01-30 ENCOUNTER — Other Ambulatory Visit: Payer: Self-pay | Admitting: Family Medicine

## 2022-01-30 DIAGNOSIS — D571 Sickle-cell disease without crisis: Secondary | ICD-10-CM

## 2022-01-30 DIAGNOSIS — G894 Chronic pain syndrome: Secondary | ICD-10-CM

## 2022-01-30 DIAGNOSIS — Z79891 Long term (current) use of opiate analgesic: Secondary | ICD-10-CM

## 2022-01-30 MED ORDER — OXYCODONE HCL 15 MG PO TABS: 15.0000 mg | ORAL_TABLET | Freq: Four times a day (QID) | ORAL | 0 refills | Status: DC | PRN

## 2022-01-30 MED ORDER — XTAMPZA ER 9 MG PO C12A: 1.0000 | EXTENDED_RELEASE_CAPSULE | Freq: Two times a day (BID) | ORAL | 0 refills | Status: DC | PRN

## 2022-01-30 NOTE — Progress Notes (Signed)
Reviewed PDMP substance reporting system prior to prescribing opiate medications. No inconsistencies noted.  Meds ordered this encounter  Medications   oxyCODONE ER (XTAMPZA ER) 9 MG C12A    Sig: Take 1 tablet by mouth every 12 (twelve) hours as needed.    Dispense:  60 capsule    Refill:  0    Order Specific Question:   Supervising Provider    Answer:   Tresa Garter [[id number]]   oxyCODONE (ROXICODONE) 15 MG immediate release tablet    Sig: Take 1 tablet (15 mg total) by mouth every 6 (six) hours as needed for up to 15 days for pain.    Dispense:  60 tablet    Refill:  0    Order Specific Question:   Supervising Provider    Answer:   Tresa Garter [[id number]]   [name]  APRN, MSN, FNP-C Patient [facility] [address] [employer], [place] [address] [phone]

## 2022-01-30 NOTE — Telephone Encounter (Signed)
Patient called to follow up on refill request for Xtampza and Oxycodone b/c her regular pharmacy is out of stock. Asking to be sent to [facility] in [place].  I explained that the provider is seeing patients and will evaluate as time permits.   She requested a MyChart message or phone call if the meds are not able to be rerouted to [place].

## 2022-01-30 NOTE — Telephone Encounter (Signed)
Patient called to follow up on refill request for Xtampza and Oxycodone b/c her regular pharmacy is out of stock. Asking to be sent to [facility] in [place].  I explained that the provider is seeing patients and will evaluate as time permits.   She requested a MyChart message or phone call if the meds are not able to be rerouted to [facility].

## 2022-02-05 NOTE — Telephone Encounter (Signed)
Spoke with pt and she confirmed meds were taken care of. Nothing further needed at this time.

## 2022-02-10 ENCOUNTER — Other Ambulatory Visit: Payer: Self-pay | Admitting: Family Medicine

## 2022-02-10 ENCOUNTER — Telehealth: Payer: Self-pay

## 2022-02-10 DIAGNOSIS — D571 Sickle-cell disease without crisis: Secondary | ICD-10-CM

## 2022-02-10 DIAGNOSIS — Z79891 Long term (current) use of opiate analgesic: Secondary | ICD-10-CM

## 2022-02-10 DIAGNOSIS — G894 Chronic pain syndrome: Secondary | ICD-10-CM

## 2022-02-10 MED ORDER — XTAMPZA ER 9 MG PO C12A: 1.0000 | EXTENDED_RELEASE_CAPSULE | Freq: Two times a day (BID) | ORAL | 0 refills | Status: DC | PRN

## 2022-02-10 MED ORDER — OXYCODONE HCL 15 MG PO TABS: 15.0000 mg | ORAL_TABLET | Freq: Four times a day (QID) | ORAL | 0 refills | Status: DC | PRN

## 2022-02-10 NOTE — Progress Notes (Signed)
Reviewed PDMP substance reporting system prior to prescribing opiate medications. No inconsistencies noted.  Meds ordered this encounter  Medications   oxyCODONE (ROXICODONE) 15 MG immediate release tablet    Sig: Take 1 tablet (15 mg total) by mouth every 6 (six) hours as needed for up to 15 days for pain.    Dispense:  60 tablet    Refill:  0    Order Specific Question:   Supervising Provider    Answer:   Tresa Garter [[id number]]   oxyCODONE ER (XTAMPZA ER) 9 MG C12A    Sig: Take 1 tablet by mouth every 12 (twelve) hours as needed.    Dispense:  60 capsule    Refill:  0    Order Specific Question:   Supervising Provider    Answer:   Tresa Garter [[id number]]     [name]  APRN, MSN, FNP-C [facility] [address] [place], [place] [address] [phone]

## 2022-02-10 NOTE — Telephone Encounter (Signed)
Oxycodone  Xtampza 

## 2022-02-11 NOTE — Telephone Encounter (Signed)
Error

## 2022-02-13 ENCOUNTER — Other Ambulatory Visit: Payer: Self-pay | Admitting: Family Medicine

## 2022-02-13 ENCOUNTER — Telehealth: Payer: Medicaid Other | Admitting: Family Medicine

## 2022-02-13 DIAGNOSIS — Z79891 Long term (current) use of opiate analgesic: Secondary | ICD-10-CM

## 2022-02-13 DIAGNOSIS — G894 Chronic pain syndrome: Secondary | ICD-10-CM

## 2022-02-13 MED ORDER — XTAMPZA ER 9 MG PO C12A: 1.0000 | EXTENDED_RELEASE_CAPSULE | Freq: Two times a day (BID) | ORAL | 0 refills | Status: AC | PRN

## 2022-02-13 NOTE — Progress Notes (Signed)
No show visit

## 2022-02-13 NOTE — Progress Notes (Signed)
Meds ordered this encounter  Medications   oxyCODONE ER (XTAMPZA ER) 9 MG C12A    Sig: Take 1 tablet by mouth every 12 (twelve) hours as needed for up to 7 days.    Dispense:  14 capsule    Refill:  0    Order Specific Question:   Supervising Provider    Answer:   Tresa Garter [[id number]]   [name]  APRN, MSN, FNP-C [facility] [address] [place], [place] [address] [phone]

## 2022-02-19 ENCOUNTER — Ambulatory Visit: Payer: Self-pay | Admitting: Nurse Practitioner

## 2022-02-23 ENCOUNTER — Other Ambulatory Visit: Payer: Self-pay | Admitting: Nurse Practitioner

## 2022-02-23 DIAGNOSIS — E559 Vitamin D deficiency, unspecified: Secondary | ICD-10-CM

## 2022-02-24 ENCOUNTER — Other Ambulatory Visit: Payer: Self-pay

## 2022-02-24 ENCOUNTER — Emergency Department (HOSPITAL_COMMUNITY)
Admission: EM | Admit: 2022-02-24 | Discharge: 2022-02-25 | Disposition: A | Discharge status: Home / Self Care | Payer: Medicaid Other | Attending: Emergency Medicine | Admitting: Emergency Medicine

## 2022-02-24 ENCOUNTER — Encounter (HOSPITAL_COMMUNITY): Payer: Self-pay | Admitting: Emergency Medicine

## 2022-02-24 DIAGNOSIS — Z79899 Other long term (current) drug therapy: Secondary | ICD-10-CM | POA: Insufficient documentation

## 2022-02-24 DIAGNOSIS — I10 Essential (primary) hypertension: Secondary | ICD-10-CM | POA: Diagnosis not present

## 2022-02-24 DIAGNOSIS — D5700 Hb-SS disease with crisis, unspecified: Secondary | ICD-10-CM | POA: Diagnosis present

## 2022-02-24 DIAGNOSIS — Z91040 Latex allergy status: Secondary | ICD-10-CM | POA: Insufficient documentation

## 2022-02-24 NOTE — ED Triage Notes (Signed)
26 yo presents to ED c/o sickle cell pain bilateral hip and legs. Pt reports pain began this morning. No other symptoms at this time.

## 2022-02-24 NOTE — ED Notes (Signed)
Unsuccessful lab draw attempt.

## 2022-02-24 NOTE — ED Provider Triage Note (Signed)
Emergency Medicine Provider Triage Evaluation Note  Sandra Mckay , a 26 y.o. female  was evaluated in triage.  Pt complains of sickle cell crisis.  Has pain to by lateral hips, knees.  Feels consistent with her prior sickle cell crises.  No chest pain, shortness of breath, fever.  No swelling to extremities.  No Recent falls or injuries.  Review of Systems  Positive: Sickle cell crises Negative:   Physical Exam  BP 114/74 (BP Location: Left Arm)   Pulse 88   Temp 98.3 F (36.8 C) (Oral)   Resp 16   Ht '5\' 3"'$  (1.6 m)   Wt 59 kg   LMP 02/10/2022 (Approximate)   SpO2 97%   BMI 23.03 kg/m  Gen:   Awake, no distress   Resp:  Normal effort  MSK:   Moves extremities without difficulty  Other:    Medical Decision Making  Medically screening exam initiated at 7:25 PM.  Appropriate orders placed.  Sandra Mckay was informed that the remainder of the evaluation will be completed by another provider, this initial triage assessment does not replace that evaluation, and the importance of remaining in the ED until their evaluation is complete.  Sickle cell   Sandra Mckay A, PA-C 02/24/22 1926

## 2022-02-25 ENCOUNTER — Other Ambulatory Visit: Payer: Self-pay | Admitting: Nurse Practitioner

## 2022-02-25 DIAGNOSIS — Z79891 Long term (current) use of opiate analgesic: Secondary | ICD-10-CM

## 2022-02-25 DIAGNOSIS — D571 Sickle-cell disease without crisis: Secondary | ICD-10-CM

## 2022-02-25 DIAGNOSIS — G894 Chronic pain syndrome: Secondary | ICD-10-CM

## 2022-02-25 LAB — COMPREHENSIVE METABOLIC PANEL
ALT: 38 U/L (ref 0–44)
AST: 52 U/L — ABNORMAL HIGH (ref 15–41)
Albumin: 4.6 g/dL (ref 3.5–5.0)
Alkaline Phosphatase: 84 U/L (ref 38–126)
Anion gap: 5 (ref 5–15)
BUN: 11 mg/dL (ref 6–20)
CO2: 27 mmol/L (ref 22–32)
Calcium: 9.5 mg/dL (ref 8.9–10.3)
Chloride: 107 mmol/L (ref 98–111)
Creatinine, Ser: 0.56 mg/dL (ref 0.44–1.00)
GFR, Estimated: >60 mL/min (ref >60)
Glucose, Bld: 97 mg/dL (ref 70–99)
Potassium: 3.9 mmol/L (ref 3.5–5.1)
Sodium: 139 mmol/L (ref 135–145)
Total Bilirubin: 2 mg/dL — ABNORMAL HIGH (ref 0.3–1.2)
Total Protein: 8 g/dL (ref 6.5–8.1)

## 2022-02-25 LAB — CBC WITH DIFFERENTIAL/PLATELET
Abs Immature Granulocytes: 0.09 10*3/uL — ABNORMAL HIGH (ref 0.00–0.07)
Basophils Absolute: 0.1 10*3/uL (ref 0.0–0.1)
Basophils Relative: 1 %
Eosinophils Absolute: 0.1 10*3/uL (ref 0.0–0.5)
Eosinophils Relative: 1 %
HCT: 24.3 % — ABNORMAL LOW (ref 36.0–46.0)
Hemoglobin: 8.2 g/dL — ABNORMAL LOW (ref 12.0–15.0)
Immature Granulocytes: 1 %
Lymphocytes Relative: 44 %
Lymphs Abs: 4.3 10*3/uL — ABNORMAL HIGH (ref 0.7–4.0)
MCH: 32.2 pg (ref 26.0–34.0)
MCHC: 33.7 g/dL (ref 30.0–36.0)
MCV: 95.3 fL (ref 80.0–100.0)
Monocytes Absolute: 1.7 10*3/uL — ABNORMAL HIGH (ref 0.1–1.0)
Monocytes Relative: 17 %
Neutro Abs: 3.6 10*3/uL (ref 1.7–7.7)
Neutrophils Relative %: 36 %
Platelets: 424 10*3/uL — ABNORMAL HIGH (ref 150–400)
RBC: 2.55 MIL/uL — ABNORMAL LOW (ref 3.87–5.11)
RDW: 20.2 % — ABNORMAL HIGH (ref 11.5–15.5)
WBC: 9.9 10*3/uL (ref 4.0–10.5)
nRBC: 0.6 % — ABNORMAL HIGH (ref 0.0–0.2)

## 2022-02-25 LAB — RETICULOCYTES
Immature Retic Fract: 27.7 % — ABNORMAL HIGH (ref 2.3–15.9)
RBC.: 2.55 MIL/uL — ABNORMAL LOW (ref 3.87–5.11)
Retic Count, Absolute: 592.5 10*3/uL — ABNORMAL HIGH (ref 19.0–186.0)
Retic Ct Pct: 17.2 % — ABNORMAL HIGH (ref 0.4–3.1)

## 2022-02-25 LAB — I-STAT BETA HCG BLOOD, ED (MC, WL, AP ONLY): I-stat hCG, quantitative: <5 m[IU]/mL (ref <5)

## 2022-02-25 MED ORDER — OXYCODONE HCL 5 MG PO TABS
15.0000 mg | ORAL_TABLET | Freq: Once | ORAL | Status: AC
  Administered 2022-02-25: 15 mg via ORAL
  Filled 2022-02-25: qty 3

## 2022-02-25 MED ORDER — HYDROMORPHONE HCL 2 MG/ML IJ SOLN
2.0000 mg | INTRAMUSCULAR | Status: AC
  Administered 2022-02-25: 2 mg via INTRAVENOUS
  Filled 2022-02-25: qty 1

## 2022-02-25 MED ORDER — ONDANSETRON HCL 4 MG/2ML IJ SOLN: 4.0000 mg | INTRAMUSCULAR | Status: DC | PRN

## 2022-02-25 MED ORDER — DIPHENHYDRAMINE HCL 25 MG PO CAPS
25.0000 mg | ORAL_CAPSULE | ORAL | Status: DC | PRN
  Administered 2022-02-25: 50 mg via ORAL
  Filled 2022-02-25: qty 2

## 2022-02-25 NOTE — Discharge Instructions (Addendum)
Please follow-up with the sickle cell center with your PCP who manages your sickle cell anemia.  Get help right away if: You have symptoms of infection. These include: Fever. Chills. Extreme tiredness. Irritability. Poor eating. Vomiting. You feel dizzy or faint. You have new abdominal pain, especially on the left side near the stomach area. You develop priapism. You have numbness in your arms or legs or have trouble moving them. You have trouble talking. You develop pain that cannot be controlled with medicine. You become short of breath. You have rapid breathing. You have a persistent cough. You have pain in your chest. You develop a severe headache or stiff neck. You feel bloated without eating or after eating a small amount of food. Your skin is pale. You suddenly lose vision.

## 2022-02-25 NOTE — ED Provider Notes (Signed)
[facility] DEPT Provider Note   CSN: [id number] Arrival date & time: 02/24/22  1915     History  Chief Complaint  Patient presents with   Sickle Cell Pain Crisis    Coraima Tibbs is a 26 y.o. female with history of sickle cell anemia, depression, pulmonary hypertension.  Presents emergency department with a complaint of lumbar back pain, bilateral hip, and bilateral leg pain.  Patient reports that her pain is similar to sickle cell pain crisis.  Patient reports that pain started yesterday.  Patient has been taking her prescribed XTAMPZA ER 9 MG without control of her pain.  Denies any recent falls or traumatic injuries.  Patient reports that triggers for her sickle cell pain crisis are normally physical activity, stress, and changes in weather.  Patient is unsure what caused current episode  Denies any fever, chills, chest pain, shortness of breath, color change, pallor, wounds, dysuria, hematuria, urinary urgency, saddle anesthesia, bowel/bladder dysfunction.   Sickle Cell Pain Crisis Associated symptoms: no chest pain, no fever, no headaches, no nausea, no shortness of breath and no vomiting        Home Medications Prior to Admission medications   Medication Sig Start Date End Date Taking? Authorizing Provider  hydroxyurea (HYDREA) 500 MG capsule TAKE 3 CAPSULES (1,500 MG TOTAL) BY MOUTH DAILY. TAKE 3 CAPSULES (1,500 MG TOTAL) BY MOUTH DAILY. 10/20/21  Yes Vevelyn Francois, NP  Melatonin 10 MG CAPS Take 20 mg by mouth at bedtime as needed. Patient taking differently: Take 20 mg by mouth at bedtime as needed (for sleep). 07/25/21  Yes Vevelyn Francois, NP  ondansetron (ZOFRAN) 4 MG tablet Take 1 tablet (4 mg total) by mouth every 8 (eight) hours as needed for nausea or vomiting. 02/28/21  Yes Vevelyn Francois, NP  oxyCODONE (ROXICODONE) 15 MG immediate release tablet Take 1 tablet (15 mg total) by mouth every 6 (six) hours as needed for up to 15 days  for pain. 02/13/22 02/28/22 Yes Dorena Dew, FNP  QUEtiapine (SEROQUEL) 25 MG tablet Take 1 tablet (25 mg total) by mouth at bedtime. Patient taking differently: Take 25 mg by mouth daily. 05/08/21 05/08/22 Yes King, Diona Foley, NP  tiZANidine (ZANAFLEX) 4 MG tablet Take 1 tablet (4 mg total) by mouth 3 (three) times daily. Patient taking differently: Take 4 mg by mouth every 8 (eight) hours as needed for muscle spasms. 06/16/21 06/16/22 Yes King, Diona Foley, NP  DULoxetine (CYMBALTA) 20 MG capsule TAKE 2 CAPSULES BY MOUTH EVERY DAY Patient taking differently: Take 40 mg by mouth daily. 08/13/21 02/09/22  Vevelyn Francois, NP  folic acid (FOLVITE) 1 MG tablet Take 1 tablet (1 mg total) by mouth daily. Patient not taking: Reported on 08/06/2021 03/23/18   Azzie Glatter, FNP  ibuprofen (ADVIL) 800 MG tablet Take 1 tablet (800 mg total) by mouth every 8 (eight) hours as needed. Patient taking differently: Take 800 mg by mouth every 8 (eight) hours as needed for mild pain. 02/28/21   Vevelyn Francois, NP  ketorolac (TORADOL) 10 MG tablet Take 10 mg by mouth every 6 (six) hours as needed for pain. 10/02/21   [provider]  Vitamin D, Ergocalciferol, (DRISDOL) 1.25 MG (50000 UNIT) CAPS capsule TAKE 1 CAPSULE (50,000 UNITS TOTAL) BY MOUTH EVERY 7 (SEVEN) DAYS FOR 8 DOSES. 02/23/22 04/14/22  Fenton Foy, NP      Allergies    Latex and Wound dressing adhesive    Review  of Systems   Review of Systems  Constitutional:  Negative for chills and fever.  Respiratory:  Negative for shortness of breath.   Cardiovascular:  Negative for chest pain.  Gastrointestinal:  Negative for abdominal pain, nausea and vomiting.  Genitourinary:  Negative for difficulty urinating and dysuria.  Musculoskeletal:  Positive for arthralgias and back pain. Negative for joint swelling and neck pain.  Skin:  Negative for color change and rash.  Neurological:  Negative for dizziness, syncope, light-headedness and  headaches.  Psychiatric/Behavioral:  Negative for confusion.     Physical Exam Updated Vital Signs BP 114/74   Pulse 68   Temp 98.7 F (37.1 C) (Oral)   Resp 16   Ht '5\' 3"'$  (1.6 m)   Wt 59 kg   LMP 02/10/2022 (Approximate)   SpO2 99%   BMI 23.03 kg/m  Physical Exam Vitals and nursing note reviewed.  Constitutional:      General: She is not in acute distress.    Appearance: She is not ill-appearing, toxic-appearing or diaphoretic.  HENT:     Head: Normocephalic.  Eyes:     General: No scleral icterus.       Right eye: No discharge.        Left eye: No discharge.  Cardiovascular:     Rate and Rhythm: Normal rate.     Pulses:          Dorsalis pedis pulses are 2+ on the right side and 2+ on the left side.  Pulmonary:     Effort: Pulmonary effort is normal. No tachypnea, bradypnea or respiratory distress.     Breath sounds: Normal breath sounds. No stridor.  Musculoskeletal:     Right lower leg: Tenderness present. No swelling, deformity, lacerations or bony tenderness. No edema.     Left lower leg: Tenderness present. No swelling, deformity, lacerations or bony tenderness. No edema.     Right ankle: No swelling, deformity, ecchymosis or lacerations. No tenderness. Normal range of motion.     Left ankle: No swelling, deformity, ecchymosis or lacerations. No tenderness. Normal range of motion.     Right foot: Normal range of motion and normal capillary refill. No swelling, deformity, laceration, tenderness, bony tenderness or crepitus. Normal pulse.     Left foot: Normal range of motion and normal capillary refill. No swelling, deformity, laceration, tenderness, bony tenderness or crepitus. Normal pulse.     Comments: No midline tenderness or deformity to cervical, thoracic, lumbar spine.  Patient has diffuse tenderness to bilateral lumbar back as well as bilateral lower extremities.  No deformity noted to bilateral lower extremities.  Skin:    General: Skin is warm and dry.   Neurological:     General: No focal deficit present.     Mental Status: She is alert.  Psychiatric:        Behavior: Behavior is cooperative.     ED Results / Procedures / Treatments   Labs (all labs ordered are listed, but only abnormal results are displayed) Labs Reviewed  RETICULOCYTES - Abnormal; Notable for the following components:      Result Value   Retic Ct Pct 17.2 (*)    RBC. 2.55 (*)    Retic Count, Absolute 592.5 (*)    Immature Retic Fract 27.7 (*)    All other components within normal limits  COMPREHENSIVE METABOLIC PANEL - Abnormal; Notable for the following components:   AST 52 (*)    Total Bilirubin 2.0 (*)    All other  components within normal limits  CBC WITH DIFFERENTIAL/PLATELET - Abnormal; Notable for the following components:   RBC 2.55 (*)    Hemoglobin 8.2 (*)    HCT 24.3 (*)    RDW 20.2 (*)    Platelets 424 (*)    nRBC 0.6 (*)    Lymphs Abs 4.3 (*)    Monocytes Absolute 1.7 (*)    Abs Immature Granulocytes 0.09 (*)    All other components within normal limits  I-STAT BETA HCG BLOOD, ED ([facility], [facility], [facility] ONLY)    EKG None  Radiology No results found.  Procedures Procedures    Medications Ordered in ED Medications  ondansetron (ZOFRAN) injection 4 mg (has no administration in time range)  diphenhydrAMINE (BENADRYL) capsule 25-50 mg (50 mg Oral Given 02/25/22 0049)  oxyCODONE (Oxy IR/ROXICODONE) immediate release tablet 15 mg (has no administration in time range)  HYDROmorphone (DILAUDID) injection 2 mg (2 mg Intravenous Given 02/25/22 0050)  HYDROmorphone (DILAUDID) injection 2 mg (2 mg Intravenous Given 02/25/22 0118)  HYDROmorphone (DILAUDID) injection 2 mg (2 mg Intravenous Given 02/25/22 0151)    ED Course/ Medical Decision Making/ A&P                           Medical Decision Making Risk Prescription drug management.   Alert 26 year old female in no acute distress, nontoxic-appearing.  Presents the emergency department complaint  of lumbar back and bilateral leg pain she attributes to sickle cell pain crisis.  Information was obtained from patient and patient's mother at bedside.  I reviewed patient's past medical records including previous prior notes, labs, and imaging.  Patient has medical history as outlined in HPI which complicates her care  Per chart review patient was seen at [facility] on 7/11 for body aches with history of sickle cell.  Pain was located to her hips, knees, and ankles which she attributes to sickle cell pain crisis.  Patient received IV Dilaudid x3 with improvement of pain and was discharged to follow-up in outpatient setting.  Patient has sickle cell pain crisis video visit on 7/14 which she no showed.  Patient did have oxycodone ER 9 mg prescribed for 7 days.  Due to concern for sickle cell pain crisis CMP, CBC, and reticulocyte count were obtained while patient was in triage.  Patient denied having any chest pain or shortness of breath and therefore no chest x-ray or EKG was obtained.  We will give patient pain management consistent with sickle cell pain crisis.  I personally viewed interpret patient's lab results.  Pertinent findings include: -Anemia with hemoglobin at 8.2; improved from 7.5 on 7/11.  Appears patient's baseline -No decrease in reticulocyte count -Total bili 2.0, appears baseline for patient.  Patient reports improvement in pain after receiving IV Dilaudid x3 however is concerned that her pain is not fully controlled.  Shared decision making with patient about admission for further pain control versus p.o. medication in ED and discharge.  Patient elects for p.o. medication in ED and then discharged.  Patient will follow-up with sickle cell pain center.  Strict return precautions were given to patient.  Based on patient's chief complaint, I considered admission might be necessary, however after reassuring ED workup feel patient is reasonable for  discharge.  Discussed results, findings, treatment and follow up. Patient advised of return precautions. Patient verbalized understanding and agreed with plan.  Portions of this note were generated with Lobby[employer]. Dictation  errors may occur despite best attempts at proofreading.         Final Clinical Impression(s) / ED Diagnoses Final diagnoses:  Sickle cell pain crisis [facility])    Rx / DC Orders ED Discharge Orders     None         Dyann Ruddle 02/25/22 Irene Shipper, MD 02/25/22 [phone]

## 2022-02-25 NOTE — Telephone Encounter (Signed)
Refill request for both oxycodone 15 mg (last filled on 02/13/2022) and oxycodone ER 9 mg (last filled on 02/13/2022).   Please advise.

## 2022-02-26 ENCOUNTER — Other Ambulatory Visit: Payer: Self-pay | Admitting: Family Medicine

## 2022-02-26 ENCOUNTER — Ambulatory Visit (INDEPENDENT_AMBULATORY_CARE_PROVIDER_SITE_OTHER): Payer: Medicaid Other | Admitting: Family Medicine

## 2022-02-26 ENCOUNTER — Non-Acute Institutional Stay (HOSPITAL_COMMUNITY)
Admission: RE | Admit: 2022-02-26 | Discharge: 2022-02-26 | Disposition: A | Discharge status: Home / Self Care | Payer: Medicaid Other | Source: Ambulatory Visit | Attending: Internal Medicine | Admitting: Internal Medicine

## 2022-02-26 ENCOUNTER — Encounter: Payer: Self-pay | Admitting: Family Medicine

## 2022-02-26 VITALS — BP 106/86 | HR 74 | Temp 97.6°F | Ht 63.0 in | Wt 129.5 lb

## 2022-02-26 DIAGNOSIS — G894 Chronic pain syndrome: Secondary | ICD-10-CM | POA: Insufficient documentation

## 2022-02-26 DIAGNOSIS — I878 Other specified disorders of veins: Secondary | ICD-10-CM | POA: Diagnosis not present

## 2022-02-26 DIAGNOSIS — D571 Sickle-cell disease without crisis: Secondary | ICD-10-CM | POA: Diagnosis not present

## 2022-02-26 MED ORDER — GABAPENTIN 100 MG PO CAPS: 300.0000 mg | ORAL_CAPSULE | Freq: Three times a day (TID) | ORAL | 1 refills | Status: DC

## 2022-02-26 MED ORDER — OXYCODONE HCL 5 MG PO TABS
10.0000 mg | ORAL_TABLET | Freq: Once | ORAL | Status: AC
  Administered 2022-02-26: 10 mg via ORAL
  Filled 2022-02-26: qty 2

## 2022-02-26 MED ORDER — XTAMPZA ER 13.5 MG PO C12A: 13.5000 mg | EXTENDED_RELEASE_CAPSULE | Freq: Two times a day (BID) | ORAL | 0 refills | Status: DC

## 2022-02-26 MED ORDER — SODIUM CHLORIDE 0.45 % IV BOLUS
1000.0000 mL | Freq: Once | INTRAVENOUS | Status: AC
  Administered 2022-02-26: 1000 mL via INTRAVENOUS

## 2022-02-26 MED ORDER — KETOROLAC TROMETHAMINE 15 MG/ML IJ SOLN
15.0000 mg | Freq: Once | INTRAMUSCULAR | Status: AC
  Administered 2022-02-26: 15 mg via INTRAVENOUS
  Filled 2022-02-26: qty 1

## 2022-02-26 NOTE — Progress Notes (Signed)
Sandra Mckay is a 26 year old female with a medical history significant for sickle cell disease, chronic pain syndrome, opiate dependence and tolerance, and history of anemia of chronic disease that presented for follow-up of chronic conditions.  Patient is complaining of some dehydration and increase low back and lower extremity pain.  Patient was last evaluated in the emergency department 1 week ago.  She will transfusion to day infusion clinic for fluid bolus and pain medications.   [name]  APRN, MSN, FNP-C [facility] [address] [place], [place] [address] [phone]

## 2022-02-26 NOTE — Progress Notes (Signed)
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 Patient also given 10 mg Oxycodone and 15 mg IV Toradol for bilateral hip and leg pain rated 8/10. Patient tolerated infusion well. Patient's pain decreased to 7/10 after pain medications and bolus. Vital signs stable. Patient alert, oriented and ambulatory at discharge.

## 2022-02-26 NOTE — Progress Notes (Signed)
[facility]   Established Patient Office Visit  Subjective   Patient ID: Sandra Mckay, female    DOB: September 21, 1995  Age: 26 y.o. MRN: [id number]  Chief Complaint  Patient presents with   Follow-up    Pt is here for SCD follow up visit. Pt states she is in a pain and need refill on XTAMPZA ER 9 MG, oxyCODONE (ROXICODONE) 15 MG,DULoxetine, Vitamin D. Pt states she went to [facility] ED and was told she has a mass on her lung.     Sandra Mckay is a very pleasant 26 year old female with a medical history consistent of sickle cell disease, chronic pain syndrome, opiate dependence and tolerance, bipolar depression, and history of anemia of chronic disease presents for medication management and sickle cell disease. Patient states that she has not been doing well over the past several months.  She endorses increased sickle cell pain crises.  Patient has been hospitalized at [facility] in the past month.  Patient is not currently followed by hematologist and does not consistently take a disease modifying agent.  She states that she misses hydroxyurea dose several times per week. Also, patient says that her pain intensity has not been controlled on her current medication regimen.  Patient has been consistently frustrated with the amount of increased pain. Patient also has a long history of bipolar depression.  She has not been followed by psychiatry.  She has been taking Seroquel at bedtime and consistently.  She denies any suicidal homicidal ideations or visual or auditory hallucinations.  Patient does endorse anhedonia.  And she has a history of polysubstance abuse, but denies any illicit drug use at this time.  Patient has been receiving behavioral counseling with LCSW monthly    Patient Active Problem List   Diagnosis Date Noted   Hypokalemia 10/29/2021   Lumbar vertebral fracture ([place]) 06/16/2021   Sickle cell crisis ([place]) 06/10/2021   Chronic anemia  06/10/2021   Influenza A 06/10/2021   Cocaine use 10/15/2020   Acute cystitis without hematuria 10/21/2019   S/P cesarean section 01/09/2019   Yeast vaginitis 70/62/3762   Umbilical vein abnormality affecting pregnancy 12/29/2018   Medication management 12/12/2018   Anxiety and depression 07/15/2018   Hb-SS disease without crisis ([place]) 07/15/2018   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 07/15/2018   Supervision of high risk pregnancy in third trimester 07/15/2018   Hyperbilirubinemia 12/25/2017   Leukocytosis 12/25/2017   Menorrhagia 12/25/2017   Thrombocytosis 12/25/2017   Tobacco abuse 12/25/2017   Bipolar and related disorder ([place]) 04/04/2017   Sickle cell pain crisis ([place]) 12/05/2016   Sickle cell anemia with crisis ([place]) 12/05/2016   Vasovagal syncope 09/18/2016   Closed fracture of lumbar vertebra without spinal cord injury ([place]) 07/15/2016   Hb-SS disease with crisis, unspecified ([place]) 03/09/2016   Nausea without vomiting 09/09/2015   Sickle cell anemia ([place]) 06/08/2015   Adnexal mass 05/29/2014   Vitamin D deficiency 12/22/2011   Chronic pain 12/21/2011   Patent foramen ovale 12/21/2011   Heart murmur 10/07/2011   Pulmonary hypertension ([place]) 10/07/2011   Dysuria 05/06/2009   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HCC)    Thrombocytosis  05/2019   Urinary tract infection    Vitamin D deficiency    Past Surgical History:  Procedure Laterality Date   CHOLECYSTECTOMY  2011   EYE SURGERY     Sty removal   [place]   @ [facility] 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   TONSILLECTOMY  2012   Social History   Tobacco Use   Smoking status: Some Days    Packs/day: 1.00    Types:  Cigarettes   Smokeless tobacco: Never   Tobacco comments:    1 pack twice a week  Vaping Use   Vaping Use: Never used  Substance Use Topics   Alcohol use: Yes    Alcohol/week: 0.0 standard drinks of alcohol    Comment: occ   Drug use: No   Social History   Socioeconomic History   Marital status: Single    Spouse name: Not on file   Number of children: Not on file   Years of education: Not on file   Highest education level: Not on file  Occupational History   Not on file  Tobacco Use   Smoking status: Some Days    Packs/day: 1.00    Types: Cigarettes   Smokeless tobacco: Never   Tobacco comments:    1 pack twice a week  Vaping Use   Vaping Use: Never used  Substance and Sexual Activity   Alcohol use: Yes    Alcohol/week: 0.0 standard drinks of alcohol    Comment: occ   Drug use: No   Sexual activity: Yes  Other Topics Concern   Not on file  Social History Narrative   Lives with mom in a one story home.  Education: 2 years of college.    Social Determinants of Health   Financial Resource Strain: Not on file  Food Insecurity: Not on file  Transportation Needs: Not on file  Physical Activity: Not on file  Stress: Not on file  Social Connections: Not on file  Intimate Partner Violence: Not on file   Family Status  Relation Name Status   Mother  Alive   Other  (Not Specified)   Other  (Not Specified)   Family History  Problem Relation Age of Onset   Arthritis Other        grandparent   Stroke Other    Hypertension Other    Diabetes Other        grandparent   Cancer - Other Other        Glioblastoma   Allergies  Allergen Reactions   Latex Rash   Wound Dressing Adhesive Rash      Review of Systems  Constitutional: Negative.   HENT: Negative.    Cardiovascular: Negative.   Gastrointestinal: Negative.   Genitourinary: Negative.   Musculoskeletal:  Positive for back pain and joint pain.  Skin: Negative.   Neurological: Negative.    Psychiatric/Behavioral: Negative.        Objective:     BP 106/86 (BP Location: Right Arm, Patient Position: Sitting, Cuff Size: Normal)   Pulse 74   Temp 97.6 F (36.4 C)   Ht '5\' 3"'$  (1.6 m)   Wt 129 lb 8 oz (58.7 kg)   LMP 02/10/2022 (Approximate)   SpO2 100%   BMI 22.94 kg/m  BP Readings from Last 3 Encounters:  02/26/22 118/68  02/26/22 106/86  02/25/22 104/66   Wt Readings from Last 3 Encounters:  02/26/22 129 lb 8 oz (  58.7 kg)  02/24/22 130 lb (59 kg)  01/14/22 132 lb 3.2 oz (60 kg)      Physical Exam Constitutional:      Appearance: Normal appearance.  Eyes:     Pupils: Pupils are equal, round, and reactive to light.  Cardiovascular:     Rate and Rhythm: Normal rate and regular rhythm.     Pulses: Normal pulses.  Pulmonary:     Effort: Pulmonary effort is normal.  Abdominal:     General: Bowel sounds are normal.  Skin:    General: Skin is warm.  Neurological:     General: No focal deficit present.     Mental Status: She is alert and oriented to person, place, and time. Mental status is at baseline.  Psychiatric:        Mood and Affect: Mood normal.        Behavior: Behavior normal.        Thought Content: Thought content normal.        Judgment: Judgment normal.      No results found for any visits on 02/26/22.  Last CBC Lab Results  Component Value Date   WBC 9.9 02/24/2022   HGB 8.2 (L) 02/24/2022   HCT 24.3 (L) 02/24/2022   MCV 95.3 02/24/2022   MCH 32.2 02/24/2022   RDW 20.2 (H) 02/24/2022   PLT 424 (H) 99/83/3825   Last metabolic panel Lab Results  Component Value Date   GLUCOSE 97 02/24/2022   NA 139 02/24/2022   K 3.9 02/24/2022   CL 107 02/24/2022   CO2 27 02/24/2022   BUN 11 02/24/2022   CREATININE 0.56 02/24/2022   GFRNONAA >60 02/24/2022   CALCIUM 9.5 02/24/2022   PROT 8.0 02/24/2022   ALBUMIN 4.6 02/24/2022   LABGLOB 2.7 01/14/2022   AGRATIO 1.9 01/14/2022   BILITOT 2.0 (H) 02/24/2022   ALKPHOS 84 02/24/2022    AST 52 (H) 02/24/2022   ALT 38 02/24/2022   ANIONGAP 5 02/24/2022   Last lipids No results found for: "CHOL", "HDL", "LDLCALC", "LDLDIRECT", "TRIG", "CHOLHDL" Last hemoglobin A1c No results found for: "HGBA1C" Last thyroid functions No results found for: "TSH", "T3TOTAL", "T4TOTAL", "THYROIDAB" Last vitamin D Lab Results  Component Value Date   VD25OH 7.1 (L) 01/14/2022   Last vitamin B12 and Folate Lab Results  Component Value Date   FOLATE 5.5 01/14/2022      The ASCVD Risk score (Arnett DK, et al., 2019) failed to calculate for the following reasons:   The 2019 ASCVD risk score is only valid for ages 55 to 51    Assessment & Plan:   Problem List Items Addressed This Visit       Other   Hb-SS disease without crisis ([place])   Relevant Medications   oxyCODONE ER (XTAMPZA ER) 13.5 MG C12A   Other Relevant Orders   [id number] 11+Oxyco+Alc+Crt-Bund   Chronic pain - Primary   Relevant Medications   oxyCODONE ER (XTAMPZA ER) 13.5 MG C12A   gabapentin (NEURONTIN) 100 MG capsule   Other Relevant Orders   [id number] 11+Oxyco+Alc+Crt-Bund   Other Visit Diagnoses     Poor venous access       Relevant Orders   IR IMAGING GUIDED PORT INSERTION      1. Chronic pain syndrome Rex has complaints of increased pain.  At this juncture, we will increase her long-acting from 9 mg every 12 hours to 13.5 mg.  We will reassess pain intensity in 1 month.  Also,  adjunct medications have been suggested.  We will start gabapentin 100 mg every 8 hours.  Discussed the importance of not drinking alcohol, driving, or operating machinery while taking these medications.  Patient expressed understanding. - [id number] 11+Oxyco+Alc+Crt-Bund - oxyCODONE ER (XTAMPZA ER) 13.5 MG C12A; Take 13.5 mg by mouth every 12 (twelve) hours.  Dispense: 14 capsule; Refill: 0 - gabapentin (NEURONTIN) 100 MG capsule; Take 3 capsules (300 mg total) by mouth 3 (three) times daily.  Dispense: 90 capsule; Refill: 1  2. Hb-SS  disease without crisis [facility]) Discussed adding disease modifying agents at length.  At this time, we will continue hydroxyurea at current dose, patient will try some techniques for remembering to take home pain medication.  Also, patient given written information on [facility]. Patient is complaining of dehydration.  She will transition to sickle cell day clinic for fluid bolus and pain medication. - [id number] 11+Oxyco+Alc+Crt-Bund - oxyCODONE ER (XTAMPZA ER) 13.5 MG C12A; Take 13.5 mg by mouth every 12 (twelve) hours.  Dispense: 14 capsule; Refill: 0  3. Poor venous access Patient has very poor venous access.  Recommend port insertion at this juncture. - IR IMAGING GUIDED PORT INSERTION; Future  Return in about 3 months (around 05/29/2022) for sickle cell anemia.   [name]  APRN, MSN, FNP-C [facility] [address] [place], [place] [address] [phone]

## 2022-02-26 NOTE — Progress Notes (Signed)
Labs ordered for patient (CBC w/diff, CMP, Ret). Patient is difficult stick for labs draws and IV starts. IV team started patient's IV but was unable to draw labs. Patient refused to be stuck again for lab draw. Labs not obtained. Provider notified.

## 2022-02-27 NOTE — Telephone Encounter (Signed)
Erx has been sent. 

## 2022-02-28 ENCOUNTER — Other Ambulatory Visit: Payer: Self-pay | Admitting: Family Medicine

## 2022-02-28 DIAGNOSIS — G894 Chronic pain syndrome: Secondary | ICD-10-CM

## 2022-02-28 DIAGNOSIS — Z79891 Long term (current) use of opiate analgesic: Secondary | ICD-10-CM

## 2022-02-28 DIAGNOSIS — D571 Sickle-cell disease without crisis: Secondary | ICD-10-CM

## 2022-02-28 MED ORDER — OXYCODONE HCL 15 MG PO TABS: 15.0000 mg | ORAL_TABLET | Freq: Four times a day (QID) | ORAL | 0 refills | Status: AC | PRN

## 2022-02-28 NOTE — Progress Notes (Signed)
Reviewed PDMP substance reporting system prior to prescribing opiate medications. No inconsistencies noted.  Meds ordered this encounter  Medications   oxyCODONE (ROXICODONE) 15 MG immediate release tablet    Sig: Take 1 tablet (15 mg total) by mouth every 6 (six) hours as needed for up to 15 days for pain.    Dispense:  60 tablet    Refill:  0    Order Specific Question:   Supervising Provider    Answer:   [name], [name]   Sandra Mckay Sandra Guardiola  APRN, MSN, FNP-C [facility] [address]  [place], [place] [address] [phone]  

## 2022-03-04 ENCOUNTER — Telehealth: Payer: Self-pay | Admitting: Family Medicine

## 2022-03-04 LAB — CANNABINOID CONFIRMATION, UR
CANNABINOIDS: POSITIVE — AB
Carboxy THC GC/MS Conf: 200 ng/mL

## 2022-03-04 LAB — OXYCODONE/OXYMORPHONE, CONFIRM
OXYCODONE/OXYMORPH: POSITIVE — AB
OXYCODONE: NEGATIVE
OXYMORPHONE (GC/MS): 305 ng/mL
OXYMORPHONE: POSITIVE — AB

## 2022-03-04 LAB — DRUG SCREEN 764883 11+OXYCO+ALC+CRT-BUND
Amphetamines, Urine: NEGATIVE ng/mL
BENZODIAZ UR QL: NEGATIVE ng/mL
Barbiturate: NEGATIVE ng/mL
Cocaine (Metabolite): NEGATIVE ng/mL
Creatinine: 86.8 mg/dL (ref 20.0–300.0)
Ethanol: NEGATIVE %
Meperidine: NEGATIVE ng/mL
Methadone Screen, Urine: NEGATIVE ng/mL
OPIATE SCREEN URINE: NEGATIVE ng/mL
Phencyclidine: NEGATIVE ng/mL
Propoxyphene: NEGATIVE ng/mL
Tramadol: NEGATIVE ng/mL
pH, Urine: 7.2 (ref 4.5–8.9)

## 2022-03-04 NOTE — Telephone Encounter (Signed)
error 

## 2022-03-05 ENCOUNTER — Other Ambulatory Visit: Payer: Self-pay | Admitting: Radiology

## 2022-03-06 ENCOUNTER — Ambulatory Visit (HOSPITAL_COMMUNITY)
Admission: RE | Admit: 2022-03-06 | Discharge: 2022-03-06 | Disposition: A | Discharge status: Home / Self Care | Payer: Medicaid Other | Source: Ambulatory Visit | Attending: Family Medicine | Admitting: Family Medicine

## 2022-03-06 ENCOUNTER — Other Ambulatory Visit: Payer: Self-pay

## 2022-03-06 ENCOUNTER — Encounter (HOSPITAL_COMMUNITY): Payer: Self-pay

## 2022-03-06 ENCOUNTER — Telehealth: Payer: Self-pay

## 2022-03-06 VITALS — BP 120/66 | HR 93 | Temp 98.6°F | Resp 18 | Ht 63.0 in | Wt 129.4 lb

## 2022-03-06 DIAGNOSIS — I878 Other specified disorders of veins: Secondary | ICD-10-CM | POA: Insufficient documentation

## 2022-03-06 DIAGNOSIS — G894 Chronic pain syndrome: Secondary | ICD-10-CM | POA: Diagnosis not present

## 2022-03-06 DIAGNOSIS — D638 Anemia in other chronic diseases classified elsewhere: Secondary | ICD-10-CM | POA: Diagnosis not present

## 2022-03-06 DIAGNOSIS — F319 Bipolar disorder, unspecified: Secondary | ICD-10-CM | POA: Insufficient documentation

## 2022-03-06 DIAGNOSIS — D571 Sickle-cell disease without crisis: Secondary | ICD-10-CM | POA: Insufficient documentation

## 2022-03-06 DIAGNOSIS — F1120 Opioid dependence, uncomplicated: Secondary | ICD-10-CM | POA: Diagnosis not present

## 2022-03-06 DIAGNOSIS — D5700 Hb-SS disease with crisis, unspecified: Secondary | ICD-10-CM

## 2022-03-06 HISTORY — PX: IR IMAGING GUIDED PORT INSERTION: IMG5740

## 2022-03-06 LAB — POCT PREGNANCY, URINE: Preg Test, Ur: NEGATIVE

## 2022-03-06 MED ORDER — MIDAZOLAM HCL 2 MG/2ML IJ SOLN
INTRAMUSCULAR | Status: AC
  Filled 2022-03-06: qty 4

## 2022-03-06 MED ORDER — LIDOCAINE-EPINEPHRINE 1 %-1:100000 IJ SOLN
INTRAMUSCULAR | Status: AC
  Filled 2022-03-06: qty 1

## 2022-03-06 MED ORDER — LIDOCAINE HCL 1 % IJ SOLN
INTRAMUSCULAR | Status: AC
  Filled 2022-03-06: qty 20

## 2022-03-06 MED ORDER — FENTANYL CITRATE (PF) 100 MCG/2ML IJ SOLN
INTRAMUSCULAR | Status: AC
  Filled 2022-03-06: qty 2

## 2022-03-06 MED ORDER — FENTANYL CITRATE (PF) 100 MCG/2ML IJ SOLN
INTRAMUSCULAR | Status: AC | PRN
  Administered 2022-03-06 (×2): 50 ug via INTRAVENOUS

## 2022-03-06 MED ORDER — SODIUM CHLORIDE 0.9 % IV SOLN: INTRAVENOUS | Status: DC

## 2022-03-06 MED ORDER — LIDOCAINE HCL (PF) 1 % IJ SOLN
INTRAMUSCULAR | Status: AC | PRN
  Administered 2022-03-06: 20 mL

## 2022-03-06 MED ORDER — MIDAZOLAM HCL 2 MG/2ML IJ SOLN
INTRAMUSCULAR | Status: AC | PRN
  Administered 2022-03-06: 1 mg via INTRAVENOUS
  Administered 2022-03-06: 2 mg via INTRAVENOUS
  Administered 2022-03-06: 1 mg via INTRAVENOUS

## 2022-03-06 MED ORDER — HEPARIN SOD (PORK) LOCK FLUSH 100 UNIT/ML IV SOLN
INTRAVENOUS | Status: AC | PRN
  Administered 2022-03-06: 500 [IU] via INTRAVENOUS

## 2022-03-06 MED ORDER — DIPHENHYDRAMINE HCL 50 MG/ML IJ SOLN
INTRAMUSCULAR | Status: AC
  Filled 2022-03-06: qty 1

## 2022-03-06 MED ORDER — DIPHENHYDRAMINE HCL 50 MG/ML IJ SOLN
INTRAMUSCULAR | Status: AC | PRN
  Administered 2022-03-06: 25 mg via INTRAVENOUS

## 2022-03-06 MED ORDER — HEPARIN SOD (PORK) LOCK FLUSH 100 UNIT/ML IV SOLN
INTRAVENOUS | Status: AC
  Filled 2022-03-06: qty 5

## 2022-03-06 NOTE — Discharge Instructions (Signed)
Discharge Instructions:   Please call Interventional Radiology clinic [phone] with any questions or concerns.  You may remove your dressing and shower tomorrow.  Do not use EMLA / Lidocaine cream for 2 weeks post Port Insertion.   Moderate Conscious Sedation, Adult, Care After This sheet gives you information about how to care for yourself after your procedure. Your health care provider may also give you more specific instructions. If you have problems or questions, contact your health care provider. What can I expect after the procedure? After the procedure, it is common to have: Sleepiness for several hours. Impaired judgment for several hours. Difficulty with balance. Vomiting if you eat too soon. Follow these instructions at home: For the time period you were told by your health care provider:     Rest. Do not participate in activities where you could fall or become injured. Do not drive or use machinery. Do not drink alcohol. Do not take sleeping pills or medicines that cause drowsiness. Do not make important decisions or sign legal documents. Do not take care of children on your own. Eating and drinking  Follow the diet recommended by your health care provider. Drink enough fluid to keep your urine pale yellow. If you vomit: Drink water, juice, or soup when you can drink without vomiting. Make sure you have little or no nausea before eating solid foods. General instructions Take over-the-counter and prescription medicines only as told by your health care provider. Have a responsible adult stay with you for the time you are told. It is important to have someone help care for you until you are awake and alert. Do not smoke. Keep all follow-up visits as told by your health care provider. This is important. Contact a health care provider if: You are still sleepy or having trouble with balance after 24 hours. You feel light-headed. You keep feeling nauseous or you  keep vomiting. You develop a rash. You have a fever. You have redness or swelling around the IV site. Get help right away if: You have trouble breathing. You have new-onset confusion at home. Summary After the procedure, it is common to feel sleepy, have impaired judgment, or feel nauseous if you eat too soon. Rest after you get home. Know the things you should not do after the procedure. Follow the diet recommended by your health care provider and drink enough fluid to keep your urine pale yellow. Get help right away if you have trouble breathing or new-onset confusion at home. This information is not intended to replace advice given to you by your health care provider. Make sure you discuss any questions you have with your health care provider. Document Revised: 11/17/2019 Document Reviewed: 06/15/2019 Elsevier Patient Education  [place] Insertion, Care After The following information offers guidance on how to care for yourself after your procedure. Your health care provider may also give you more specific instructions. If you have problems or questions, contact your health care provider. What can I expect after the procedure? After the procedure, it is common to have: Discomfort at the port insertion site. Bruising on the skin over the port. This should improve over 3-4 days. Follow these instructions at home: [facility] care After your port is placed, you will get a manufacturer's information card. The card has information about your port. Keep this card with you at all times. Take care of the port as told by your health care provider. Ask your health care provider if you or  a family member can get training for taking care of the port at home. A home health care nurse will be be available to help care for the port. Make sure to remember what type of port you have. Incision care     Follow instructions from your health care provider about how to take care of  your port insertion site. Make sure you: Wash your hands with soap and water for at least 20 seconds before and after you change your bandage (dressing). If soap and water are not available, use hand sanitizer. Change your dressing as told by your health care provider. Leave stitches (sutures), skin glue, or adhesive strips in place. These skin closures may need to stay in place for 2 weeks or longer. If adhesive strip edges start to loosen and curl up, you may trim the loose edges. Do not remove adhesive strips completely unless your health care provider tells you to do that. Check your port insertion site every day for signs of infection. Check for: Redness, swelling, or pain. Fluid or blood. Warmth. Pus or a bad smell. Activity Return to your normal activities as told by your health care provider. Ask your health care provider what activities are safe for you. You may have to avoid lifting. Ask your health care provider how much you can safely lift. General instructions Take over-the-counter and prescription medicines only as told by your health care provider. Do not take baths, swim, or use a hot tub until your health care provider approves. Ask your health care provider if you may take showers. You may only be allowed to take sponge baths. If you were given a sedative during the procedure, it can affect you for several hours. Do not drive or operate machinery until your health care provider says that it is safe. Wear a medical alert bracelet in case of an emergency. This will tell any health care providers that you have a port. Keep all follow-up visits. This is important. Contact a health care provider if: You cannot flush your port with saline as directed, or you cannot draw blood from the port. You have a fever or chills. You have redness, swelling, or pain around your port insertion site. You have fluid or blood coming from your port insertion site. Your port insertion site feels warm  to the touch. You have pus or a bad smell coming from the port insertion site. Get help right away if: You have chest pain or shortness of breath. You have bleeding from your port that you cannot control. These symptoms may be an emergency. Get help right away. Call 911. Do not wait to see if the symptoms will go away. Do not drive yourself to the hospital. Summary Take care of the port as told by your health care provider. Keep the manufacturer's information card with you at all times. Change your dressing as told by your health care provider. Contact a health care provider if you have a fever or chills or if you have redness, swelling, or pain around your port insertion site. Keep all follow-up visits. This information is not intended to replace advice given to you by your health care provider. Make sure you discuss any questions you have with your health care provider. Document Revised: 01/21/2021 Document Reviewed: 01/21/2021 Elsevier Patient Education  [place].

## 2022-03-06 NOTE — Consult Note (Signed)
Chief Complaint: Patient was seen in consultation today for  port a cath placement  Referring Physician(s): Hollis,Lachina M  Supervising Physician: Markus Daft  Patient Status: [facility] - Out-pt  History of Present Illness: Sandra Mckay is a 26 y.o. female with PMH sig for bipolar depression, sickle cell disease, chronic pain syndrome, opiate dependence/tolerance, anemia of chronic disease and poor venous access. Request now received for port a cath placement and pt presents today for the procedure.   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 Authorizing Provider  folic acid (FOLVITE) 1 MG tablet Take 1 tablet (1 mg total) by mouth daily. Patient not taking: Reported on 08/06/2021 03/23/18   Azzie Glatter, FNP  gabapentin (NEURONTIN) 100 MG capsule Take 3 capsules (300 mg total) by mouth 3 (three) times daily. 02/26/22   Dorena Dew, FNP  hydroxyurea (HYDREA) 500 MG capsule TAKE 3 CAPSULES (1,500 MG TOTAL) BY MOUTH DAILY. TAKE 3 CAPSULES (1,500 MG TOTAL) BY MOUTH DAILY. Patient taking differently: Take 1,500 mg by mouth daily. TAKE 3 CAPSULES BY MOUTH DAILY. 10/20/21   Vevelyn Francois, NP  ibuprofen (ADVIL) 800 MG tablet Take 1 tablet (800 mg total) by mouth every 8 (eight) hours as needed.  02/28/21   Vevelyn Francois, NP  Melatonin 10 MG CAPS Take 20 mg by mouth at bedtime as needed. Patient not taking: Reported on 02/26/2022 07/25/21   Vevelyn Francois, NP  ondansetron (ZOFRAN) 4 MG tablet Take 1 tablet (4 mg total) by mouth every 8 (eight) hours as needed for nausea or vomiting. 02/28/21   Vevelyn Francois, NP  oxyCODONE (ROXICODONE) 15 MG immediate release tablet Take 1 tablet (15 mg total) by mouth every 6 (six) hours as needed for up to 15 days for pain. 02/28/22 03/15/22  Dorena Dew, FNP  oxyCODONE ER [facility] ER) 13.5 MG C12A Take 13.5 mg by mouth every 12 (twelve) hours. 02/26/22   Dorena Dew, FNP  QUEtiapine (SEROQUEL) 25 MG tablet Take 1 tablet (25 mg total) by mouth at bedtime. Patient taking differently: Take 25 mg by mouth daily. 05/08/21 05/08/22  Vevelyn Francois, NP  tiZANidine (ZANAFLEX) 4 MG tablet Take 1 tablet (4 mg total) by mouth 3 (three) times daily. Patient taking differently: Take 4 mg by mouth every 8 (eight) hours as needed for muscle spasms. 06/16/21 06/16/22  Vevelyn Francois, NP  Vitamin D, Ergocalciferol, (DRISDOL) 1.25 MG (50000 UNIT) CAPS capsule TAKE 1 CAPSULE (50,000 UNITS TOTAL) BY MOUTH EVERY 7 (SEVEN) DAYS FOR 8 DOSES. 02/23/22 04/14/22  Fenton Foy, NP     Family History  Problem Relation Age of Onset  Arthritis Other        grandparent   Stroke Other    Hypertension Other    Diabetes Other        grandparent   Cancer - Other Other        Glioblastoma    Social History   Socioeconomic History   Marital status: Single    Spouse name: Not on file   Number of children: Not on file   Years of education: Not on file   Highest education level: Not on file  Occupational History   Not on file  Tobacco Use   Smoking status: Some Days    Packs/day: 1.00    Types: Cigarettes   Smokeless tobacco: Never   Tobacco comments:    1 pack twice a week  Vaping Use   Vaping Use: Never used  Substance and Sexual Activity   Alcohol  use: Yes    Alcohol/week: 0.0 standard drinks of alcohol    Comment: occ   Drug use: No   Sexual activity: Yes  Other Topics Concern   Not on file  Social History Narrative   Lives with mom in a one story home.  Education: 2 years of college.    Social Determinants of Health   Financial Resource Strain: Not on file  Food Insecurity: Not on file  Transportation Needs: Not on file  Physical Activity: Not on file  Stress: Not on file  Social Connections: Not on file      Review of Systems denies fever,HA,CP, dypnea, cough, abd pain,N/V or bleeding; she does have back pain  Vital Signs: Vitals:   03/06/22 1030  BP: 106/66  Pulse: 71  Resp: 16  Temp: 98.4 F (36.9 C)  SpO2: 95%    LMP 02/10/2022 (Approximate)     Physical Exam awake/alert; chest- CTA bilat; heart- RRR; abd- soft,+BS,NT; no LE edema  Imaging: No results found.  Labs:  CBC: Recent Labs    10/30/21 0524 10/30/21 1633 11/05/21 1055 01/14/22 1136 02/24/22 2350  WBC 8.0  --  10.1 9.6 9.9  HGB 6.4* 9.3* 7.7* 9.5* 8.2*  HCT 18.0* 25.2* 21.9* 26.9* 24.3*  PLT 354  --  454* 452* 424*    COAGS: No results for input(s): "INR", "APTT" in the last 8760 hours.  BMP: Recent Labs    10/28/21 2151 10/30/21 0524 11/05/21 1055 01/14/22 1136 02/24/22 2350  NA 135 135 138 138 139  K 3.4* 4.1 4.2 4.8 3.9  CL 108 108 109 100 107  CO2 '22 22 23 20 27  '$ GLUCOSE 147* 89 113* 63* 97  BUN '9 8 14 9 11  '$ CALCIUM 9.1 8.9 9.3 9.5 9.5  CREATININE 0.52 0.38* 0.37* 0.49* 0.56  GFRNONAA >60 >60 >60  --  >60    LIVER FUNCTION TESTS: Recent Labs    10/28/21 2151 11/05/21 1055 01/14/22 1136 02/24/22 2350  BILITOT 2.6* 1.7* 2.6* 2.0*  AST 36 30 47* 52*  ALT 33 19 30 38  ALKPHOS 71 62 104 84  PROT 7.4 7.4 7.8 8.0  ALBUMIN 4.5 4.7 5.1* 4.6    TUMOR MARKERS: No results for input(s): "AFPTM", "CEA", "CA199", "CHROMGRNA" in the last 8760 hours.  Assessment and Plan: 26 y.o. female with PMH sig for  bipolar depression, sickle cell disease, chronic pain syndrome, opiate dependence/tolerance, anemia of chronic disease and poor venous access. Request now received for port a cath placement and pt presents today for the procedure.Risks and benefits of image guided port-a-catheter  placement was discussed with the patient including, but not limited to bleeding, infection, pneumothorax, or fibrin sheath development and need for additional procedures.  All of the patient's questions were answered, patient is agreeable to proceed. Consent signed and in chart.    Thank you for this interesting consult.  I greatly enjoyed meeting Sandra Mckay and look forward to participating in their care.  A copy of this report was sent to the requesting provider on this date.  Electronically Signed: D. Rowe Robert, PA-C 03/06/2022, 10:22 AM   I spent a total of  20 minutes   in face to face in clinical consultation, greater than 50% of which was counseling/coordinating care for port a cath placement

## 2022-03-06 NOTE — Procedures (Signed)
Interventional Radiology Procedure:   Indications: Sickle cell disease and poor venous access  Procedure: Port placement  Findings: Right jugular port, tip at SVC/RA junction  Complications: None     EBL: Minimal, less than 10 ml  Plan: Discharge in one hour.  Keep port site and incisions dry for at least 24 hours.     Sandra Mckay R. Anselm Pancoast, MD  Pager: [phone]

## 2022-03-06 NOTE — Telephone Encounter (Signed)
Prior authorization started for [facility] ER 13.'5MG'$  er capsules waiting on patient insurance approval

## 2022-03-12 ENCOUNTER — Telehealth: Payer: Self-pay | Admitting: Family Medicine

## 2022-03-12 ENCOUNTER — Other Ambulatory Visit: Payer: Self-pay | Admitting: Family Medicine

## 2022-03-12 DIAGNOSIS — G894 Chronic pain syndrome: Secondary | ICD-10-CM

## 2022-03-12 DIAGNOSIS — D571 Sickle-cell disease without crisis: Secondary | ICD-10-CM

## 2022-03-12 MED ORDER — XTAMPZA ER 13.5 MG PO C12A: 13.5000 mg | EXTENDED_RELEASE_CAPSULE | Freq: Two times a day (BID) | ORAL | 0 refills | Status: DC

## 2022-03-12 NOTE — Progress Notes (Signed)
Reviewed PDMP substance reporting system prior to prescribing opiate medications. No inconsistencies noted.    Meds ordered this encounter  Medications   oxyCODONE ER (XTAMPZA ER) 13.5 MG C12A    Sig: Take 13.5 mg by mouth every 12 (twelve) hours.    Dispense:  14 capsule    Refill:  0    Order Specific Question:   Supervising Provider    Answer:   Tresa Garter [[id number]]      [name]  APRN, MSN, FNP-C Patient [place] [address] [place],  [address] [phone]

## 2022-03-12 NOTE — Telephone Encounter (Signed)
Xtampza refill request  

## 2022-03-16 ENCOUNTER — Telehealth: Payer: Self-pay

## 2022-03-16 ENCOUNTER — Other Ambulatory Visit: Payer: Self-pay | Admitting: Family Medicine

## 2022-03-16 DIAGNOSIS — D571 Sickle-cell disease without crisis: Secondary | ICD-10-CM

## 2022-03-16 DIAGNOSIS — G894 Chronic pain syndrome: Secondary | ICD-10-CM

## 2022-03-16 MED ORDER — XTAMPZA ER 13.5 MG PO C12A: 13.5000 mg | EXTENDED_RELEASE_CAPSULE | Freq: Two times a day (BID) | ORAL | 0 refills | Status: DC

## 2022-03-16 MED ORDER — OXYCODONE HCL 15 MG PO TABS: 15.0000 mg | ORAL_TABLET | ORAL | 0 refills | Status: DC | PRN

## 2022-03-16 NOTE — Telephone Encounter (Signed)
Prior Authorization started on oxyCODONE HCl '15MG'$  tablets waiting on patient insurance approval   Outcome Approved today Request Reference Number: [id number]. OXYCODONE TAB '15MG'$  is approved through 09/16/2022. For further questions, call [employer] at [phone]. Drug oxyCODONE HCl '15MG'$  tablets Form [employer] Electronic Prior Authorization Form [phone] NCPDP)

## 2022-03-16 NOTE — Progress Notes (Signed)
Reviewed PDMP substance reporting system prior to prescribing opiate medications. No inconsistencies noted.  Meds ordered this encounter  Medications   oxyCODONE (ROXICODONE) 15 MG immediate release tablet    Sig: Take 1 tablet (15 mg total) by mouth every 4 (four) hours as needed for pain.    Dispense:  60 tablet    Refill:  0    Order Specific Question:   Supervising Provider    Answer:   Tresa Garter [[id number]]   oxyCODONE ER (XTAMPZA ER) 13.5 MG C12A    Sig: Take 13.5 mg by mouth every 12 (twelve) hours.    Dispense:  14 capsule    Refill:  0    Order Specific Question:   Supervising Provider    Answer:   Tresa Garter [[id number]]     [name]  APRN, MSN, FNP-C Patient [place] [address] [place], [place] [address] [phone]

## 2022-03-17 ENCOUNTER — Ambulatory Visit (INDEPENDENT_AMBULATORY_CARE_PROVIDER_SITE_OTHER): Payer: Medicaid Other | Admitting: Clinical

## 2022-03-17 DIAGNOSIS — F319 Bipolar disorder, unspecified: Secondary | ICD-10-CM

## 2022-03-17 DIAGNOSIS — F419 Anxiety disorder, unspecified: Secondary | ICD-10-CM
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
## 2022-03-19 ENCOUNTER — Other Ambulatory Visit: Payer: Self-pay | Admitting: Family Medicine

## 2022-03-19 DIAGNOSIS — G894 Chronic pain syndrome: Secondary | ICD-10-CM

## 2022-03-19 DIAGNOSIS — D571 Sickle-cell disease without crisis: Secondary | ICD-10-CM

## 2022-03-19 MED ORDER — XTAMPZA ER 13.5 MG PO C12A: 13.5000 mg | EXTENDED_RELEASE_CAPSULE | Freq: Two times a day (BID) | ORAL | 0 refills | Status: DC

## 2022-03-19 NOTE — Progress Notes (Signed)
Reviewed PDMP substance reporting system prior to prescribing opiate medications. No inconsistencies noted.  Meds ordered this encounter  Medications   oxyCODONE ER (XTAMPZA ER) 13.5 MG C12A    Sig: Take 13.5 mg by mouth every 12 (twelve) hours.    Dispense:  14 capsule    Refill:  0    Order Specific Question:   Supervising Provider    Answer:   Tresa Garter [[id number]]   [name]  APRN, MSN, FNP-C Patient [place] [address] [place], [place] [address] [phone]

## 2022-03-25 ENCOUNTER — Telehealth: Payer: Self-pay

## 2022-03-25 NOTE — Telephone Encounter (Signed)
Oxycodone 15 mg

## 2022-03-26 ENCOUNTER — Ambulatory Visit: Payer: Medicaid Other | Admitting: Clinical

## 2022-03-26 ENCOUNTER — Other Ambulatory Visit: Payer: Self-pay | Admitting: Internal Medicine

## 2022-03-26 ENCOUNTER — Encounter (HOSPITAL_COMMUNITY): Payer: Self-pay | Admitting: Emergency Medicine

## 2022-03-26 ENCOUNTER — Other Ambulatory Visit: Payer: Self-pay

## 2022-03-26 ENCOUNTER — Observation Stay (HOSPITAL_COMMUNITY)
Admission: EM | Admit: 2022-03-26 | Discharge: 2022-03-28 | Disposition: A | Discharge status: Home / Self Care | Payer: Medicaid Other | Attending: Internal Medicine | Admitting: Internal Medicine

## 2022-03-26 ENCOUNTER — Telehealth: Payer: Self-pay | Admitting: Clinical

## 2022-03-26 DIAGNOSIS — F32A Depression, unspecified: Secondary | ICD-10-CM | POA: Diagnosis not present

## 2022-03-26 DIAGNOSIS — Z79899 Other long term (current) drug therapy: Secondary | ICD-10-CM | POA: Diagnosis not present

## 2022-03-26 DIAGNOSIS — F419 Anxiety disorder, unspecified: Secondary | ICD-10-CM | POA: Diagnosis not present

## 2022-03-26 DIAGNOSIS — D571 Sickle-cell disease without crisis: Secondary | ICD-10-CM | POA: Diagnosis present

## 2022-03-26 DIAGNOSIS — F319 Bipolar disorder, unspecified: Secondary | ICD-10-CM

## 2022-03-26 DIAGNOSIS — Z91040 Latex allergy status: Secondary | ICD-10-CM | POA: Insufficient documentation

## 2022-03-26 DIAGNOSIS — D5700 Hb-SS disease with crisis, unspecified: Secondary | ICD-10-CM | POA: Diagnosis not present

## 2022-03-26 DIAGNOSIS — D649 Anemia, unspecified: Secondary | ICD-10-CM | POA: Diagnosis not present

## 2022-03-26 DIAGNOSIS — G894 Chronic pain syndrome: Secondary | ICD-10-CM

## 2022-03-26 DIAGNOSIS — F17210 Nicotine dependence, cigarettes, uncomplicated: Secondary | ICD-10-CM | POA: Diagnosis not present

## 2022-03-26 LAB — CBC WITH DIFFERENTIAL/PLATELET
Abs Immature Granulocytes: 0.05 10*3/uL (ref 0.00–0.07)
Basophils Absolute: 0.1 10*3/uL (ref 0.0–0.1)
Basophils Relative: 1 %
Eosinophils Absolute: 0.1 10*3/uL (ref 0.0–0.5)
Eosinophils Relative: 1 %
HCT: 22.4 % — ABNORMAL LOW (ref 36.0–46.0)
Hemoglobin: 8 g/dL — ABNORMAL LOW (ref 12.0–15.0)
Immature Granulocytes: 0 %
Lymphocytes Relative: 25 %
Lymphs Abs: 3.1 10*3/uL (ref 0.7–4.0)
MCH: 34.2 pg — ABNORMAL HIGH (ref 26.0–34.0)
MCHC: 35.7 g/dL (ref 30.0–36.0)
MCV: 95.7 fL (ref 80.0–100.0)
Monocytes Absolute: 1.5 10*3/uL — ABNORMAL HIGH (ref 0.1–1.0)
Monocytes Relative: 12 %
Neutro Abs: 7.4 10*3/uL (ref 1.7–7.7)
Neutrophils Relative %: 61 %
Platelets: 372 10*3/uL (ref 150–400)
RBC: 2.34 MIL/uL — ABNORMAL LOW (ref 3.87–5.11)
RDW: 23.9 % — ABNORMAL HIGH (ref 11.5–15.5)
WBC: 12.2 10*3/uL — ABNORMAL HIGH (ref 4.0–10.5)
nRBC: 1 % — ABNORMAL HIGH (ref 0.0–0.2)

## 2022-03-26 LAB — I-STAT BETA HCG BLOOD, ED (MC, WL, AP ONLY): I-stat hCG, quantitative: <5 m[IU]/mL (ref <5)

## 2022-03-26 LAB — COMPREHENSIVE METABOLIC PANEL
ALT: 26 U/L (ref 0–44)
AST: 41 U/L (ref 15–41)
Albumin: 5 g/dL (ref 3.5–5.0)
Alkaline Phosphatase: 89 U/L (ref 38–126)
Anion gap: 8 (ref 5–15)
BUN: 6 mg/dL (ref 6–20)
CO2: 23 mmol/L (ref 22–32)
Calcium: 10.2 mg/dL (ref 8.9–10.3)
Chloride: 109 mmol/L (ref 98–111)
Creatinine, Ser: 0.58 mg/dL (ref 0.44–1.00)
GFR, Estimated: >60 mL/min (ref >60)
Glucose, Bld: 104 mg/dL — ABNORMAL HIGH (ref 70–99)
Potassium: 4.1 mmol/L (ref 3.5–5.1)
Sodium: 140 mmol/L (ref 135–145)
Total Bilirubin: 3.3 mg/dL — ABNORMAL HIGH (ref 0.3–1.2)
Total Protein: 7.8 g/dL (ref 6.5–8.1)

## 2022-03-26 LAB — RETICULOCYTES
Immature Retic Fract: 30.9 % — ABNORMAL HIGH (ref 2.3–15.9)
RBC.: 2.34 MIL/uL — ABNORMAL LOW (ref 3.87–5.11)
Retic Count, Absolute: 649 10*3/uL — ABNORMAL HIGH (ref 19.0–186.0)
Retic Ct Pct: 29.5 % — ABNORMAL HIGH (ref 0.4–3.1)

## 2022-03-26 MED ORDER — HYDROMORPHONE HCL 2 MG/ML IJ SOLN: 2.0000 mg | INTRAMUSCULAR | Status: DC

## 2022-03-26 MED ORDER — HYDROMORPHONE 1 MG/ML IV SOLN
INTRAVENOUS | Status: DC
  Administered 2022-03-27: 30 mg via INTRAVENOUS
  Filled 2022-03-26: qty 30

## 2022-03-26 MED ORDER — SENNOSIDES-DOCUSATE SODIUM 8.6-50 MG PO TABS
1.0000 | ORAL_TABLET | Freq: Two times a day (BID) | ORAL | Status: DC
  Administered 2022-03-26 – 2022-03-27 (×2): 1 via ORAL
  Filled 2022-03-26 (×4): qty 1

## 2022-03-26 MED ORDER — HYDROMORPHONE HCL 2 MG/ML IJ SOLN: 1.0000 mg | INTRAMUSCULAR | Status: DC

## 2022-03-26 MED ORDER — XTAMPZA ER 13.5 MG PO C12A: 13.5000 mg | EXTENDED_RELEASE_CAPSULE | Freq: Two times a day (BID) | ORAL | 0 refills | Status: DC

## 2022-03-26 MED ORDER — SODIUM CHLORIDE 0.45 % IV SOLN: INTRAVENOUS | Status: DC

## 2022-03-26 MED ORDER — HYDROMORPHONE HCL 2 MG/ML IJ SOLN
1.0000 mg | INTRAMUSCULAR | Status: DC | PRN
  Administered 2022-03-26 – 2022-03-27 (×6): 1 mg via INTRAVENOUS
  Filled 2022-03-26 (×6): qty 1

## 2022-03-26 MED ORDER — KETOROLAC TROMETHAMINE 15 MG/ML IJ SOLN
15.0000 mg | Freq: Four times a day (QID) | INTRAMUSCULAR | Status: DC
  Administered 2022-03-27 – 2022-03-28 (×7): 15 mg via INTRAVENOUS
  Filled 2022-03-26 (×7): qty 1

## 2022-03-26 MED ORDER — ONDANSETRON HCL 4 MG PO TABS: 4.0000 mg | ORAL_TABLET | Freq: Three times a day (TID) | ORAL | Status: DC | PRN

## 2022-03-26 MED ORDER — DIPHENHYDRAMINE HCL 25 MG PO CAPS
25.0000 mg | ORAL_CAPSULE | ORAL | Status: DC | PRN
  Administered 2022-03-27 (×2): 25 mg via ORAL
  Filled 2022-03-26 (×2): qty 1

## 2022-03-26 MED ORDER — FOLIC ACID 1 MG PO TABS
1.0000 mg | ORAL_TABLET | Freq: Every day | ORAL | Status: DC
  Administered 2022-03-27 – 2022-03-28 (×2): 1 mg via ORAL
  Filled 2022-03-26 (×2): qty 1

## 2022-03-26 MED ORDER — OXYCODONE HCL ER 15 MG PO T12A
15.0000 mg | EXTENDED_RELEASE_TABLET | Freq: Two times a day (BID) | ORAL | Status: DC
  Administered 2022-03-27 – 2022-03-28 (×3): 15 mg via ORAL
  Filled 2022-03-26 (×3): qty 1

## 2022-03-26 MED ORDER — HYDROMORPHONE HCL 2 MG/ML IJ SOLN: 1.0000 mg | Freq: Two times a day (BID) | INTRAMUSCULAR | Status: DC | PRN

## 2022-03-26 MED ORDER — MORPHINE SULFATE 15 MG PO TABS
15.0000 mg | ORAL_TABLET | Freq: Once | ORAL | Status: DC
  Administered 2022-03-26: 15 mg via ORAL
  Filled 2022-03-26: qty 1

## 2022-03-26 MED ORDER — HYDROXYUREA 500 MG PO CAPS
1500.0000 mg | ORAL_CAPSULE | Freq: Every day | ORAL | Status: DC
  Administered 2022-03-27 – 2022-03-28 (×2): 1500 mg via ORAL
  Filled 2022-03-26 (×2): qty 3

## 2022-03-26 MED ORDER — GABAPENTIN 300 MG PO CAPS
300.0000 mg | ORAL_CAPSULE | Freq: Three times a day (TID) | ORAL | Status: DC
  Administered 2022-03-26 – 2022-03-28 (×5): 300 mg via ORAL
  Filled 2022-03-26 (×5): qty 1

## 2022-03-26 MED ORDER — ONDANSETRON HCL 4 MG/2ML IJ SOLN
4.0000 mg | INTRAMUSCULAR | Status: DC | PRN
  Administered 2022-03-26 – 2022-03-27 (×2): 4 mg via INTRAVENOUS
  Filled 2022-03-26 (×2): qty 2

## 2022-03-26 MED ORDER — SODIUM CHLORIDE 0.9 % IV SOLN
12.5000 mg | Freq: Once | INTRAVENOUS | Status: AC
  Administered 2022-03-26: 12.5 mg via INTRAVENOUS
  Filled 2022-03-26: qty 12.5

## 2022-03-26 MED ORDER — ENOXAPARIN SODIUM 40 MG/0.4ML IJ SOSY
40.0000 mg | PREFILLED_SYRINGE | INTRAMUSCULAR | Status: DC
Start: 2022-03-26 — End: 2022-03-28
  Administered 2022-03-26 – 2022-03-27 (×2): 40 mg via SUBCUTANEOUS
  Filled 2022-03-26 (×2): qty 0.4

## 2022-03-26 MED ORDER — QUETIAPINE FUMARATE 25 MG PO TABS
25.0000 mg | ORAL_TABLET | Freq: Every day | ORAL | Status: DC
  Administered 2022-03-27 – 2022-03-28 (×2): 25 mg via ORAL
  Filled 2022-03-26 (×2): qty 1

## 2022-03-26 MED ORDER — KETOROLAC TROMETHAMINE 15 MG/ML IJ SOLN
15.0000 mg | INTRAMUSCULAR | Status: AC
  Administered 2022-03-26: 15 mg via INTRAVENOUS
  Filled 2022-03-26: qty 1

## 2022-03-26 MED ORDER — POLYETHYLENE GLYCOL 3350 17 G PO PACK: 17.0000 g | PACK | Freq: Every day | ORAL | Status: DC | PRN

## 2022-03-26 MED ORDER — NALOXONE HCL 0.4 MG/ML IJ SOLN: 0.4000 mg | INTRAMUSCULAR | Status: DC | PRN

## 2022-03-26 MED ORDER — HYDROMORPHONE HCL 2 MG/ML IJ SOLN
2.0000 mg | INTRAMUSCULAR | Status: AC
  Administered 2022-03-26: 2 mg via INTRAVENOUS

## 2022-03-26 MED ORDER — HYDROMORPHONE HCL 2 MG/ML IJ SOLN: 0.5000 mg | INTRAMUSCULAR | Status: DC

## 2022-03-26 MED ORDER — PANTOPRAZOLE SODIUM 40 MG PO TBEC
40.0000 mg | DELAYED_RELEASE_TABLET | Freq: Every day | ORAL | Status: DC
  Administered 2022-03-26 – 2022-03-28 (×2): 40 mg via ORAL
  Filled 2022-03-26 (×3): qty 1

## 2022-03-26 MED ORDER — PANTOPRAZOLE SODIUM 40 MG IV SOLR
40.0000 mg | Freq: Every day | INTRAVENOUS | Status: DC
  Filled 2022-03-26: qty 10

## 2022-03-26 MED ORDER — SODIUM CHLORIDE 0.9% FLUSH: 9.0000 mL | INTRAVENOUS | Status: DC | PRN

## 2022-03-26 MED ORDER — HYDROMORPHONE HCL 2 MG/ML IJ SOLN
0.5000 mg | INTRAMUSCULAR | Status: DC
  Filled 2022-03-26: qty 1

## 2022-03-26 NOTE — BH Specialist Note (Signed)
Patient joined video visit briefly but was unable to complete visit today. No billable visit.  [name], [facility] [phone]

## 2022-03-26 NOTE — ED Provider Notes (Signed)
[place] DEPT Provider Note   CSN: [id number] Arrival date & time: 03/26/22  1824     History  Chief Complaint  Patient presents with   Sickle Cell Pain Crisis    Sandra Mckay is a 26 y.o. female.  26 year old female with history of sickle disease presents with pain crisis times several days.  Seen at outside facility and those records were reviewed and patient was treated with hydromorphone there.  Patient notes no relief of her symptoms with that.  Continues to have pain in her back without associated urinary symptoms.  No fever, cough, congestion.  No nausea vomiting diarrhea.  Has been using Xtampza and oxycodone at home without relief.       Home Medications Prior to Admission medications   Medication Sig Start Date End Date Taking? Authorizing Provider  folic acid (FOLVITE) 1 MG tablet Take 1 tablet (1 mg total) by mouth daily. Patient not taking: Reported on 08/06/2021 03/23/18   Azzie Glatter, FNP  gabapentin (NEURONTIN) 100 MG capsule Take 3 capsules (300 mg total) by mouth 3 (three) times daily. 02/26/22   Dorena Dew, FNP  hydroxyurea (HYDREA) 500 MG capsule TAKE 3 CAPSULES (1,500 MG TOTAL) BY MOUTH DAILY. TAKE 3 CAPSULES (1,500 MG TOTAL) BY MOUTH DAILY. Patient taking differently: Take 1,500 mg by mouth daily. TAKE 3 CAPSULES BY MOUTH DAILY. 10/20/21   Vevelyn Francois, NP  ibuprofen (ADVIL) 800 MG tablet Take 1 tablet (800 mg total) by mouth every 8 (eight) hours as needed. 02/28/21   Vevelyn Francois, NP  Melatonin 10 MG CAPS Take 20 mg by mouth at bedtime as needed. Patient not taking: Reported on 02/26/2022 07/25/21   Vevelyn Francois, NP  ondansetron (ZOFRAN) 4 MG tablet Take 1 tablet (4 mg total) by mouth every 8 (eight) hours as needed for nausea or vomiting. 02/28/21   Vevelyn Francois, NP  oxyCODONE (ROXICODONE) 15 MG immediate release tablet Take 1 tablet (15 mg total) by mouth every 4 (four) hours as needed for pain. 03/16/22    Dorena Dew, FNP  oxyCODONE ER [facility]) 13.5 MG C12A Take 13.5 mg by mouth every 12 (twelve) hours. 03/26/22   Tresa Garter, MD  QUEtiapine (SEROQUEL) 25 MG tablet Take 1 tablet (25 mg total) by mouth at bedtime. Patient taking differently: Take 25 mg by mouth daily. 05/08/21 05/08/22  Vevelyn Francois, NP  tiZANidine (ZANAFLEX) 4 MG tablet Take 1 tablet (4 mg total) by mouth 3 (three) times daily. Patient taking differently: Take 4 mg by mouth every 8 (eight) hours as needed for muscle spasms. 06/16/21 06/16/22  Vevelyn Francois, NP  Vitamin D, Ergocalciferol, (DRISDOL) 1.25 MG (50000 UNIT) CAPS capsule TAKE 1 CAPSULE (50,000 UNITS TOTAL) BY MOUTH EVERY 7 (SEVEN) DAYS FOR 8 DOSES. 02/23/22 04/14/22  Fenton Foy, NP      Allergies    Latex and Wound dressing adhesive    Review of Systems   Review of Systems  All other systems reviewed and are negative.   Physical Exam Updated Vital Signs BP 117/70   Pulse 82   Temp 98.8 F (37.1 C) (Oral)   Resp 15   LMP 02/10/2022 (Approximate)   SpO2 100%  Physical Exam Vitals and nursing note reviewed.  Constitutional:      General: She is not in acute distress.    Appearance: Normal appearance. She is well-developed. She is not toxic-appearing.  HENT:  Head: Normocephalic and atraumatic.  Eyes:     General: Lids are normal.     Conjunctiva/sclera: Conjunctivae normal.     Pupils: Pupils are equal, round, and reactive to light.  Neck:     Thyroid: No thyroid mass.     Trachea: No tracheal deviation.  Cardiovascular:     Rate and Rhythm: Normal rate and regular rhythm.     Heart sounds: Normal heart sounds. No murmur heard.    No gallop.  Pulmonary:     Effort: Pulmonary effort is normal. No respiratory distress.     Breath sounds: Normal breath sounds. No stridor. No decreased breath sounds, wheezing, rhonchi or rales.  Abdominal:     General: There is no distension.     Palpations: Abdomen is soft.      Tenderness: There is no abdominal tenderness. There is no rebound.  Musculoskeletal:        General: No tenderness. Normal range of motion.     Cervical back: Normal range of motion and neck supple.  Skin:    General: Skin is warm and dry.     Findings: No abrasion or rash.  Neurological:     Mental Status: She is alert and oriented to person, place, and time. Mental status is at baseline.     GCS: GCS eye subscore is 4. GCS verbal subscore is 5. GCS motor subscore is 6.     Cranial Nerves: No cranial nerve deficit.     Sensory: No sensory deficit.     Motor: Motor function is intact.  Psychiatric:        Attention and Perception: Attention normal.        Speech: Speech normal.        Behavior: Behavior normal.     ED Results / Procedures / Treatments   Labs (all labs ordered are listed, but only abnormal results are displayed) Labs Reviewed  COMPREHENSIVE METABOLIC PANEL - Abnormal; Notable for the following components:      Result Value   Glucose, Bld 104 (*)    Total Bilirubin 3.3 (*)    All other components within normal limits  CBC WITH DIFFERENTIAL/PLATELET - Abnormal; Notable for the following components:   WBC 12.2 (*)    RBC 2.34 (*)    Hemoglobin 8.0 (*)    HCT 22.4 (*)    MCH 34.2 (*)    RDW 23.9 (*)    nRBC 1.0 (*)    Monocytes Absolute 1.5 (*)    All other components within normal limits  RETICULOCYTES - Abnormal; Notable for the following components:   Retic Ct Pct 29.5 (*)    RBC. 2.34 (*)    Retic Count, Absolute 649.0 (*)    Immature Retic Fract 30.9 (*)    All other components within normal limits  I-STAT BETA HCG BLOOD, ED (MC, [facility], AP ONLY)    EKG None  Radiology No results found.  Procedures Procedures    Medications Ordered in ED Medications  0.45 % sodium chloride infusion (has no administration in time range)  ketorolac (TORADOL) 15 MG/ML injection 15 mg (has no administration in time range)  ondansetron (ZOFRAN) injection 4 mg (has  no administration in time range)  diphenhydrAMINE (BENADRYL) 12.5 mg in sodium chloride 0.9 % 50 mL IVPB (has no administration in time range)  HYDROmorphone (DILAUDID) injection 2 mg (has no administration in time range)  HYDROmorphone (DILAUDID) injection 2 mg (has no administration in time range)  HYDROmorphone (DILAUDID)  injection 2 mg (has no administration in time range)    ED Course/ Medical Decision Making/ A&P                           Medical Decision Making Risk Prescription drug management.   Patient's labs reviewed and does have increase in her bilirubin.  Suspect that she is having a sickle cell crisis.  Hemoglobin is stable.  Patient be medicated with IV fluids as well as IV hydromorphone.  Patient will require admission to the hospital        Final Clinical Impression(s) / ED Diagnoses Final diagnoses:  None    Rx / DC Orders ED Discharge Orders     None         Lacretia Leigh, MD 03/26/22 2022

## 2022-03-26 NOTE — ED Provider Triage Note (Signed)
Emergency Medicine Provider Triage Evaluation Note  Sandra Mckay , a 26 y.o. female  was evaluated in triage.  Pt complains of sickle cell pain that significantly worsened several days ago.  Patient was seen by [facility] health and was discharged, but states that she did not have significant pain relief at time of discharge.  Pain is primarily in the lower back, bilateral hips and upper legs.  She took her oxycodone at home without improvement in symptoms.  She denies chest pain and shortness of breath.   Review of Systems  Positive: Pain in bilateral hips, legs and lower back Negative: Chest pain, shortness of breath  Physical Exam  BP (!) 130/91 (BP Location: Left Arm)   Pulse 94   Temp 98.8 F (37.1 C) (Oral)   Resp 18   LMP 02/10/2022 (Approximate)   SpO2 96%  Gen:   Awake, crying, obviously uncomfortable Resp:  Normal effort lungs CTA MSK:   Moves extremities without difficulty  Other:   Medical Decision Making  Medically screening exam initiated at 6:42 PM.  Appropriate orders placed.  Sandra Mckay was informed that the remainder of the evaluation will be completed by another provider, this initial triage assessment does not replace that evaluation, and the importance of remaining in the ED until their evaluation is complete.     Tonye Pearson, [place] 03/26/22 1843

## 2022-03-26 NOTE — ED Triage Notes (Signed)
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 Pt states she was seen and d/c from [facility] w/ no relief.

## 2022-03-26 NOTE — Telephone Encounter (Addendum)
Integrated Behavioral Health General Follow Up Note  03/26/2022 Name: Sandra Mckay MRN: [id number] DOB: 1996/02/26 Sandra Mckay is a 26 y.o. year old female who sees Dorena Dew, FNP for primary care. LCSW was initially consulted to assist the patient with Mental Health Counseling and Resources.  Interpreter: No.   Interpreter Name & Language: none  Assessment: Patient experiencing bipolar disorder and anxiety. [name] follows for Integrated Behavioral Health (IBH) counseling.  Ongoing Intervention: This afternoon [name] and patient spoke on the phone. Patient was being discharged from [facility] ER earlier today during scheduled [place] (IBH) visit. Supportive counseling today, including emotional validation and reflective listening provided. Patient interested in a psychiatry referral. [name] to follow for assistance with this.   SDOH (Social Determinants of Health) assessments performed: No  Review of patient status, including review of consultants reports, relevant laboratory and other test results, and collaboration with appropriate care team members and the patient's provider was performed as part of comprehensive patient evaluation and provision of services.    [name], [facility] [phone]

## 2022-03-26 NOTE — H&P (Signed)
History and Physical   Sandra Mckay OMV:[id number] DOB: 1995-08-10 DOA: 03/26/2022  PCP: Dorena Dew, FNP   Patient coming from: Home  Chief Complaint: Pain, sickle cell  HPI: Sandra Mckay is a 26 y.o. female with medical history significant of sickle cell disease, bipolar disorder, anxiety, depression, cocaine use, pulmonary hypertension presenting with sickle cell pain.  Patient reports 3 days of sickle cell pain primarily located at her back, hips, knees which is typical for her.  She states she was seen at outside facility, [facility] a few days ago received some Dilaudid without significant relief.  Pain has not responded to home medications and she has limited supply of some of them due to getting the wrong refill recently.  She states she typically needs to be admitted when her pain gets this bad.  She denies fevers, chills, chest pain, shortness of breath, abdominal pain, constipation, diarrhea, nausea, vomiting.  ED Course: Vital signs in the ED stable.  Lab work-up included CMP with glucose 109, T. bili 3.3.  CBC with hemoglobin of 8.0 and mild leukocytosis 12.2.  Reticulocyte count elevated to 29.5.  No imaging in the ED.  Patient received Dilaudid without relief as well as Toradol.  She also received Zofran and started on half-normal saline and received a dose of Benadryl.  Patient requested for sickle cell pain crisis.  Review of Systems: As per HPI otherwise all other systems reviewed and are negative.  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 She has been smoking an average of 1 pack per day. She has never used smokeless tobacco. She reports current alcohol use. She reports that she does not use drugs.  Allergies  Allergen Reactions   Latex Rash   Wound Dressing Adhesive Rash    Family History  Problem Relation Age of Onset   Arthritis Other        grandparent   Stroke Other    Hypertension Other    Diabetes Other        grandparent   Cancer - Other Other        Glioblastoma  Reviewed on admission  Prior to Admission medications   Medication Sig Start Date End Date Taking? Authorizing Provider  folic acid (FOLVITE) 1 MG tablet Take 1 tablet (1 mg total) by mouth daily. Patient not taking: Reported on 08/06/2021 03/23/18   Azzie Glatter, FNP  gabapentin (NEURONTIN) 100 MG capsule Take 3 capsules (300 mg total) by mouth 3 (three) times daily. 02/26/22   Dorena Dew, FNP  hydroxyurea (HYDREA)  500 MG capsule TAKE 3 CAPSULES (1,500 MG TOTAL) BY MOUTH DAILY. TAKE 3 CAPSULES (1,500 MG TOTAL) BY MOUTH DAILY. Patient taking differently: Take 1,500 mg by mouth daily. TAKE 3 CAPSULES BY MOUTH DAILY. 10/20/21   Vevelyn Francois, NP  ibuprofen (ADVIL) 800 MG tablet Take 1 tablet (800 mg total) by mouth every 8 (eight) hours as needed. 02/28/21   Vevelyn Francois, NP  Melatonin 10 MG CAPS Take 20 mg by mouth at bedtime as needed. Patient not taking: Reported on 02/26/2022 07/25/21   Vevelyn Francois, NP  ondansetron (ZOFRAN) 4 MG tablet Take 1 tablet (4 mg total) by mouth every 8 (eight) hours as needed for nausea or vomiting. 02/28/21   Vevelyn Francois, NP  oxyCODONE  (ROXICODONE) 15 MG immediate release tablet Take 1 tablet (15 mg total) by mouth every 4 (four) hours as needed for pain. 03/16/22   Dorena Dew, FNP  oxyCODONE ER [facility]) 13.5 MG C12A Take 13.5 mg by mouth every 12 (twelve) hours. 03/26/22   Tresa Garter, MD  QUEtiapine (SEROQUEL) 25 MG tablet Take 1 tablet (25 mg total) by mouth at bedtime. Patient taking differently: Take 25 mg by mouth daily. 05/08/21 05/08/22  Vevelyn Francois, NP  tiZANidine (ZANAFLEX) 4 MG tablet Take 1 tablet (4 mg total) by mouth 3 (three) times daily. Patient taking differently: Take 4 mg by mouth every 8 (eight) hours as needed for muscle spasms. 06/16/21 06/16/22  Vevelyn Francois, NP  Vitamin D, Ergocalciferol, (DRISDOL) 1.25 MG (50000 UNIT) CAPS capsule TAKE 1 CAPSULE (50,000 UNITS TOTAL) BY MOUTH EVERY 7 (SEVEN) DAYS FOR 8 DOSES. 02/23/22 04/14/22  Fenton Foy, NP    Physical Exam: Vitals:   03/26/22 1833 03/26/22 2008 03/26/22 2049  BP: (!) 130/91 117/70   Pulse: 94 82   Resp: 18 15   Temp: 98.8 F (37.1 C)  98.1 F (36.7 C)  TempSrc: Oral  Oral  SpO2: 96% 100%     Physical Exam Constitutional:      General: She is not in acute distress.    Appearance: Normal appearance.  HENT:     Head: Normocephalic and atraumatic.     Mouth/Throat:     Mouth: Mucous membranes are moist.     Pharynx: Oropharynx is clear.  Eyes:     Extraocular Movements: Extraocular movements intact.     Pupils: Pupils are equal, round, and reactive to light.  Cardiovascular:     Rate and Rhythm: Normal rate and regular rhythm.     Pulses: Normal pulses.     Heart sounds: Normal heart sounds.  Pulmonary:     Effort: Pulmonary effort is normal. No respiratory distress.     Breath sounds: Normal breath sounds.  Abdominal:     General: Bowel sounds are normal. There is no distension.     Palpations: Abdomen is soft.     Tenderness: There is no abdominal tenderness.  Musculoskeletal:        General: No  swelling or deformity.  Skin:    General: Skin is warm and dry.  Neurological:     General: No focal deficit present.     Mental Status: Mental status is at baseline.    Labs on Admission: I have personally reviewed following labs and imaging studies  CBC: Recent Labs  Lab 03/26/22 1903  WBC 12.2*  NEUTROABS 7.4  HGB 8.0*  HCT 22.4*  MCV 95.7  PLT 372  Basic Metabolic Panel: Recent Labs  Lab 03/26/22 1903  NA 140  K 4.1  CL 109  CO2 23  GLUCOSE 104*  BUN 6  CREATININE 0.58  CALCIUM 10.2    GFR: CrCl cannot be calculated (Unknown ideal weight.).  Liver Function Tests: Recent Labs  Lab 03/26/22 1903  AST 41  ALT 26  ALKPHOS 89  BILITOT 3.3*  PROT 7.8  ALBUMIN 5.0    Urine analysis:    Component Value Date/Time   COLORURINE YELLOW 10/29/2021 1922   APPEARANCEUR CLEAR 10/29/2021 1922   LABSPEC 1.005 10/29/2021 1922   PHURINE 6.0 10/29/2021 1922   GLUCOSEU NEGATIVE 10/29/2021 1922   HGBUR SMALL (A) 10/29/2021 [place] NEGATIVE 10/29/2021 1922   BILIRUBINUR neg 08/06/2020 [place] 10/29/2021 1922   PROTEINUR NEGATIVE 10/29/2021 1922   UROBILINOGEN 0.2 08/06/2020 1525   UROBILINOGEN >=8.0 08/04/2017 1144   NITRITE NEGATIVE 10/29/2021 1922   LEUKOCYTESUR NEGATIVE 10/29/2021 1922    Radiological Exams on Admission: No results found.  EKG: Not performed in the emergency department  Assessment/Plan Active Problems:   Sickle cell anemia (HCC)   Chronic anemia   Anxiety and depression   Sickle cell pain crisis (HCC)   Sickle cell pain crisis Sickle cell anemia > Presenting with sickle cell pain for the past several days without improvement with home medications.  Also was seen at outside facility, [facility], without improvement with Dilaudid there. > States her pain is typical for her pain crisis is located in her back.  She states when he gets this bad she typically needs to be admitted. > Not significantly better  with interventions in the ED.  Noted to have elevated reticulocyte count and T. bili.  Hemoglobin stable at 8.0.  Mild leukocytosis likely reactive at 12.2. > Denies any shortness of breath. - Monitor on telemetry - Replace home [place] with formulary oxycodone every 12 hours - Continue with Dilaudid while in the ED and start Dilaudid PCA pump on the floor - Scheduled Toradol - PPI - Benadryl - Zofran - Folate - Hydroxyurea  Anxiety Depression Bipolar - Continue home Seroquel  DVT prophylaxis: Lovenox Code Status:   Full Family Communication:  None on admission. Disposition Plan:   Patient is from:  Home  Anticipated DC to:  Home  Anticipated DC date:  1 to 2 days  Anticipated DC barriers: None  Consults called:  None Admission status:  Observation, telemetry  Severity of Illness: The appropriate patient status for this patient is OBSERVATION. Observation status is judged to be reasonable and necessary in order to provide the required intensity of service to ensure the patient's safety. The patient's presenting symptoms, physical exam findings, and initial radiographic and laboratory data in the context of their medical condition is felt to place them at decreased risk for further clinical deterioration. Furthermore, it is anticipated that the patient will be medically stable for discharge from the hospital within 2 midnights of admission.    Marcelyn Bruins MD [facility]  How to contact the [facility] Attending or Consulting provider [place] or covering provider during after hours [place], for this patient?   Check the care team in [facility] and look for a) attending/consulting TR[facility] provider listed and b) the [facility] team listed Log into [email or web address] and use [place]'s universal password to access. If you do not have the password, please contact the hospital operator. Locate the [facility] provider you are looking for under [facility]  and page to a number that you can be directly  reached. If you still have difficulty reaching the provider, please page the [facility] (Director on Call) for the Hospitalists listed on amion for assistance.  03/26/2022, 8:54 PM

## 2022-03-27 ENCOUNTER — Telehealth: Payer: Self-pay | Admitting: Internal Medicine

## 2022-03-27 ENCOUNTER — Other Ambulatory Visit: Payer: Self-pay | Admitting: Internal Medicine

## 2022-03-27 DIAGNOSIS — D571 Sickle-cell disease without crisis: Secondary | ICD-10-CM

## 2022-03-27 MED ORDER — CHLORHEXIDINE GLUCONATE CLOTH 2 % EX PADS
6.0000 | MEDICATED_PAD | Freq: Every day | CUTANEOUS | Status: DC
  Administered 2022-03-28: 6 via TOPICAL

## 2022-03-27 MED ORDER — HYDROMORPHONE 1 MG/ML IV SOLN
INTRAVENOUS | Status: DC
  Administered 2022-03-27: 6.25 mg via INTRAVENOUS
  Administered 2022-03-27: 5 mg via INTRAVENOUS
  Administered 2022-03-27: 4.5 mg via INTRAVENOUS
  Administered 2022-03-27: 5.5 mg via INTRAVENOUS
  Administered 2022-03-28 (×2): 4.5 mg via INTRAVENOUS
  Administered 2022-03-28: 6 mg via INTRAVENOUS
  Administered 2022-03-28: 1 mg via INTRAVENOUS
  Filled 2022-03-27: qty 30

## 2022-03-27 MED ORDER — OXYCODONE HCL 15 MG PO TABS
15.0000 mg | ORAL_TABLET | ORAL | 0 refills | Status: DC | PRN
Start: 2022-03-28 — End: 2022-04-03

## 2022-03-27 NOTE — ED Notes (Signed)
Pt was asymptomatic with Orthostatic VS and was negative.

## 2022-03-27 NOTE — Progress Notes (Signed)
SICKLE CELL SERVICE PROGRESS NOTE  Sandra Mckay CVE:[id number] DOB: September 17, 1995 DOA: 03/26/2022 PCP: Dorena Dew, FNP  Assessment/Plan: Principal Problem:   Sickle cell pain crisis ([place]) Active Problems:   Sickle cell anemia (HCC)   Chronic anemia   Anxiety and depression  Sickle cell pain crisis: Patient currently on Dilaudid PCA, Toradol, IV,.  Continue current treatment.  Initiate oral therapy Anemia of chronic disease: Continue to monitor H&H Anxiety with depression: Continue home regimen  Code Status: Full code Family Communication: No family at bedside Disposition Plan: [place]  Pager [phone]. If 7PM-7AM, please contact night-coverage.  03/27/2022, 10:59 AM  LOS: 0 days   Brief narrative: Sandra Mckay is a 26 y.o. female with medical history significant of sickle cell disease, bipolar disorder, anxiety, depression, cocaine use, pulmonary hypertension presenting with sickle cell pain.   Patient reports 3 days of sickle cell pain primarily located at her back, hips, knees which is typical for her.  She states she was seen at outside facility, [facility] a few days ago received some Dilaudid without significant relief.  Pain has not responded to home medications and she has limited supply of some of them due to getting the wrong refill recently.  She states she typically needs to be admitted when her pain gets this bad.   She denies fevers, chills, chest pain, shortness of breath, abdominal pain, constipation, diarrhea, nausea, vomiting.  Consultants: None  Procedures: Chest x-ray  Antibiotics: None  HPI/Subjective: Patient is having 7 out of 10 pain now.  Pain mainly in her back and legs.  She has been doing better.  She thinks she might be ready for discharge by tomorrow.  No other significant complaints.  Objective: Vitals:   03/27/22 0547 03/27/22 0555 03/27/22 0844 03/27/22 0902  BP: 101/61   (!) 106/45  Pulse: (!) 58   60  Resp: '18  16  13  '$ Temp: 97.9 F (36.6 C)   97.9 F (36.6 C)  TempSrc: Oral   Oral  SpO2:   93% 100%  Weight:  61.4 kg    Height:  '5\' 3"'$  (1.6 m)     Weight change:   Intake/Output Summary (Last 24 hours) at 03/27/2022 1059 Last data filed at 03/27/2022 0600 Gross per 24 hour  Intake 1395.46 ml  Output --  Net 1395.46 ml    General: Alert, awake, oriented x3, in no acute distress.  HEENT: [place]/AT PEERL, EOMI Neck: Trachea midline,  no masses, no thyromegal,y no JVD, no carotid bruit OROPHARYNX:  Moist, No exudate/ erythema/lesions.  Heart: Regular rate and rhythm, without murmurs, rubs, gallops, PMI non-displaced, no heaves or thrills on palpation.  Lungs: Clear to auscultation, no wheezing or rhonchi noted. No increased vocal fremitus resonant to percussion  Abdomen: Soft, nontender, nondistended, positive bowel sounds, no masses no hepatosplenomegaly noted..  Neuro: No focal neurological deficits noted cranial nerves II through XII grossly intact. DTRs 2+ bilaterally upper and lower extremities. Strength 5 out of 5 in bilateral upper and lower extremities. Musculoskeletal: No warm swelling or erythema around joints, no spinal tenderness noted. Psychiatric: Patient alert and oriented x3, good insight and cognition, good recent to remote recall. Lymph node survey: No cervical axillary or inguinal lymphadenopathy noted.   Data Reviewed: Basic Metabolic Panel: Recent Labs  Lab 03/26/22 1903  NA 140  K 4.1  CL 109  CO2 23  GLUCOSE 104*  BUN 6  CREATININE 0.58  CALCIUM 10.2   Liver Function Tests: Recent Labs  Lab 03/26/22 1903  AST 41  ALT 26  ALKPHOS 89  BILITOT 3.3*  PROT 7.8  ALBUMIN 5.0   No results for input(s): "LIPASE", "AMYLASE" in the last 168 hours. No results for input(s): "AMMONIA" in the last 168 hours. CBC: Recent Labs  Lab 03/26/22 1903  WBC 12.2*  NEUTROABS 7.4  HGB 8.0*  HCT 22.4*  MCV 95.7  PLT 372   Cardiac Enzymes: No results for input(s):  "CKTOTAL", "CKMB", "CKMBINDEX", "TROPONINI" in the last 168 hours. BNP (last 3 results) No results for input(s): "BNP" in the last 8760 hours.  ProBNP (last 3 results) No results for input(s): "PROBNP" in the last 8760 hours.  CBG: No results for input(s): "GLUCAP" in the last 168 hours.  No results found for this or any previous visit (from the past 240 hour(s)).   Studies: IR IMAGING GUIDED PORT INSERTION  Result Date: 03/06/2022 INDICATION: 26 year old with sickle cell disease and poor venous access. EXAM: FLUOROSCOPIC AND ULTRASOUND GUIDED PLACEMENT OF A SUBCUTANEOUS PORT COMPARISON:  None Available. MEDICATIONS: Moderate sedation ANESTHESIA/SEDATION: Moderate (conscious) sedation was employed during this procedure. A total of Versed 4.0 mg and fentanyl 100 mcg and Benadryl 25 mg was administered intravenously at the order of the provider performing the procedure. Total intra-service moderate sedation time: 27 minutes minutes. Patient's level of consciousness and vital signs were monitored continuously by radiology nurse throughout the procedure under the supervision of the provider performing the procedure. FLUOROSCOPY TIME:  Radiation Exposure Index (as provided by the fluoroscopic device): 1 mGy [place] COMPLICATIONS: None immediate. PROCEDURE: The procedure, risks, benefits, and alternatives were explained to the patient. Questions regarding the procedure were encouraged and answered. The patient understands and consents to the procedure. Patient was placed supine on the interventional table. Ultrasound confirmed a patent right internal jugular vein. Ultrasound image was saved for documentation. The right chest and neck were cleaned with a skin antiseptic and a sterile drape was placed. Maximal barrier sterile technique was utilized including caps, mask, sterile gowns, sterile gloves, sterile drape, hand hygiene and skin antiseptic. The right neck was anesthetized with 1% lidocaine. Small  incision was made in the right neck with a blade. Micropuncture set was placed in the right internal jugular vein with ultrasound guidance. The micropuncture wire was used for measurement purposes. The right chest was anesthetized with 1% lidocaine with epinephrine. #15 blade was used to make an incision and a subcutaneous port pocket was formed. [place] was assembled. Subcutaneous tunnel was formed with a stiff tunneling device. The port catheter was brought through the subcutaneous tunnel. The port was placed in the subcutaneous pocket. The micropuncture set was exchanged for a peel-away sheath. The catheter was placed through the peel-away sheath and the tip was positioned at the superior cavoatrial junction. Catheter placement was confirmed with fluoroscopy. The port was accessed and flushed with heparinized saline. The port pocket was closed using two layers of absorbable sutures and Dermabond. The vein skin site was closed using a single layer of absorbable suture and Dermabond. Sterile dressings were applied. Patient tolerated the procedure well without an immediate complication. Ultrasound and fluoroscopic images were taken and saved for this procedure. IMPRESSION: Placement of a subcutaneous power-injectable port device. Catheter tip at the superior cavoatrial junction. Electronically Signed   By: Markus Daft M.D.   On: 03/06/2022 16:03    Scheduled Meds:  enoxaparin (LOVENOX) injection  40 mg Subcutaneous P29J   folic acid  1 mg Oral Daily  gabapentin  300 mg Oral TID   HYDROmorphone   Intravenous Q4H   hydroxyurea  1,500 mg Oral Daily   ketorolac  15 mg Intravenous Q6H   oxyCODONE  15 mg Oral Q12H   pantoprazole  40 mg Oral Daily   QUEtiapine  25 mg Oral Daily   senna-docusate  1 tablet Oral BID   Continuous Infusions:  sodium chloride 150 mL/hr at 03/27/22 5789    Principal Problem:   Sickle cell pain crisis (HCC) Active Problems:   Sickle cell anemia (HCC)   Chronic  anemia   Anxiety and depression

## 2022-03-27 NOTE — Telephone Encounter (Signed)
Patient requested Oxycodone '15mg'$  refilled. Ginger Organ was already filled.

## 2022-03-27 NOTE — Plan of Care (Signed)
Pt rating pain 6/10.  Encouraged to use PCA.  Problem: Education: Goal: Knowledge of General Education information will improve Description: Including pain rating scale, medication(s)/side effects and non-pharmacologic comfort measures Outcome: Progressing   Problem: Health Behavior/Discharge Planning: Goal: Ability to manage health-related needs will improve Outcome: Progressing   Problem: Clinical Measurements: Goal: Ability to maintain clinical measurements within normal limits will improve Outcome: Progressing Goal: Will remain free from infection Outcome: Progressing Goal: Diagnostic test results will improve Outcome: Progressing Goal: Respiratory complications will improve Outcome: Progressing Goal: Cardiovascular complication will be avoided Outcome: Progressing   Problem: Activity: Goal: Risk for activity intolerance will decrease Outcome: Progressing   Problem: Nutrition: Goal: Adequate nutrition will be maintained Outcome: Progressing   Problem: Coping: Goal: Level of anxiety will decrease Outcome: Progressing   Problem: Elimination: Goal: Will not experience complications related to bowel motility Outcome: Progressing Goal: Will not experience complications related to urinary retention Outcome: Progressing   Problem: Pain Managment: Goal: General experience of comfort will improve Outcome: Progressing   Problem: Safety: Goal: Ability to remain free from injury will improve Outcome: Progressing   Problem: Skin Integrity: Goal: Risk for impaired skin integrity will decrease Outcome: Progressing   Problem: Education: Goal: Knowledge of vaso-occlusive preventative measures will improve Outcome: Progressing Goal: Awareness of infection prevention will improve Outcome: Progressing Goal: Awareness of signs and symptoms of anemia will improve Outcome: Progressing Goal: Long-term complications will improve Outcome: Progressing   Problem:  Self-Care: Goal: Ability to incorporate actions that prevent/reduce pain crisis will improve Outcome: Progressing   Problem: Bowel/Gastric: Goal: Gut motility will be maintained Outcome: Progressing   Problem: Tissue Perfusion: Goal: Complications related to inadequate tissue perfusion will be avoided or minimized Outcome: Progressing   Problem: Respiratory: Goal: Pulmonary complications will be avoided or minimized Outcome: Progressing Goal: Acute Chest Syndrome will be identified early to prevent complications Outcome: Progressing   Problem: Fluid Volume: Goal: Ability to maintain a balanced intake and output will improve Outcome: Progressing   Problem: Sensory: Goal: Pain level will decrease with appropriate interventions Outcome: Progressing   Problem: Health Behavior: Goal: Postive changes in compliance with treatment and prescription regimens will improve Outcome: Progressing

## 2022-03-28 DIAGNOSIS — D5700 Hb-SS disease with crisis, unspecified: Secondary | ICD-10-CM | POA: Diagnosis not present

## 2022-03-28 MED ORDER — HEPARIN SOD (PORK) LOCK FLUSH 100 UNIT/ML IV SOLN
500.0000 [IU] | Freq: Once | INTRAVENOUS | Status: AC
  Administered 2022-03-28: 500 [IU] via INTRAVENOUS

## 2022-03-28 NOTE — TOC CM/SW Note (Signed)
  Transition of Care [facility]) Screening Note   Patient Details  Name: Sandra Mckay Date of Birth: 25-Jan-1996   [facility]) CM/SW Contact:    Ross Ludwig, LCSW Phone Number: 03/28/2022, 1:30 PM    Transition of Care Department [facility]) has reviewed patient and no TOC needs have been identified at this time. We will continue to monitor patient advancement through interdisciplinary progression rounds. If new patient transition needs arise, please place a TOC consult.

## 2022-03-28 NOTE — Progress Notes (Signed)
Patient discharged, provided discharge paperwork. Reviewed medication regimen. Patient walked to main entrance.

## 2022-03-28 NOTE — Discharge Summary (Signed)
DISCHARGE SUMMARY  Sandra Mckay  MR#: [id number]  DOB:02-Jun-1996  Date of Admission: 03/26/2022 Date of Discharge: 03/28/2022  Attending Physician:Corvin Sorbo,LAWAL  Patient's WUJ:[id number], Sandra Partridge, FNP  Consults:  None  Discharge Diagnoses: Present on Admission:  Sickle cell anemia (HCC)  Chronic anemia  Anxiety and depression  Sickle cell pain crisis ([place])     Allergies as of 03/28/2022       Reactions   Latex Rash   Wound Dressing Adhesive Rash        Medication List     TAKE these medications    folic acid 1 MG tablet Commonly known as: FOLVITE Take 1 tablet (1 mg total) by mouth daily.   gabapentin 100 MG capsule Commonly known as: NEURONTIN Take 3 capsules (300 mg total) by mouth 3 (three) times daily.   hydroxyurea 500 MG capsule Commonly known as: HYDREA TAKE 3 CAPSULES (1,500 MG TOTAL) BY MOUTH DAILY. TAKE 3 CAPSULES (1,500 MG TOTAL) BY MOUTH DAILY. What changed: additional instructions   ibuprofen 800 MG tablet Commonly known as: ADVIL Take 1 tablet (800 mg total) by mouth every 8 (eight) hours as needed.   Melatonin 10 MG Caps Take 20 mg by mouth at bedtime as needed.   ondansetron 4 MG tablet Commonly known as: Zofran Take 1 tablet (4 mg total) by mouth every 8 (eight) hours as needed for nausea or vomiting.   oxyCODONE 15 MG immediate release tablet Commonly known as: ROXICODONE Take 1 tablet (15 mg total) by mouth every 4 (four) hours as needed for pain.   QUEtiapine 25 MG tablet Commonly known as: SEROquel Take 1 tablet (25 mg total) by mouth at bedtime. What changed: when to take this   tiZANidine 4 MG tablet Commonly known as: ZANAFLEX Take 1 tablet (4 mg total) by mouth 3 (three) times daily. What changed:  when to take this reasons to take this   Vitamin D (Ergocalciferol) 1.25 MG (50000 UNIT) Caps capsule Commonly known as: DRISDOL TAKE 1 CAPSULE (50,000 UNITS TOTAL) BY MOUTH EVERY 7 (SEVEN) DAYS FOR 8 DOSES.   Xtampza  ER 13.5 MG C12a Generic drug: oxyCODONE ER Take 13.5 mg by mouth every 12 (twelve) hours.          Hospital Course: Present on Admission:  Sickle cell anemia (HCC)  Chronic anemia  Anxiety and depression  Sickle cell pain crisis ([place]) : #1 sickle cell pain crisis: Patient was treated with Dilaudid PCA, Toradol and IV fluids.  She appears to be better and back to baseline.  Patient will be discharged home on her home regimen  #2 chronic anemia: Continue monitor H&H  #3 depression with anxiety: Continue home regimen.   Day of Discharge BP 112/70   Pulse 78   Temp 98.3 F (36.8 C)   Resp 16   Ht '5\' 3"'$  (1.6 m)   Wt 61.4 kg   SpO2 99%   BMI 23.98 kg/m   Physical Exam: General: Alert, awake, oriented x3, in no acute distress.  HEENT: /AT PEERL, EOMI Neck: Trachea midline,  no masses, no thyromegal,y no JVD, no carotid bruit OROPHARYNX:  Moist, No exudate/ erythema/lesions.  Heart: Regular rate and rhythm, without murmurs, rubs, gallops, PMI non-displaced, no heaves or thrills on palpation.  Lungs: Clear to auscultation, no wheezing or rhonchi noted. No increased vocal fremitus resonant to percussion  Abdomen: Soft, nontender, nondistended, positive bowel sounds, no masses no hepatosplenomegaly noted..  Neuro: No focal neurological deficits noted cranial nerves II through XII  grossly intact. DTRs 2+ bilaterally upper and lower extremities. Strength 5 out of 5 in bilateral upper and lower extremities. Musculoskeletal: No warm swelling or erythema around joints, no spinal tenderness noted. Psychiatric: Patient alert and oriented x3, good insight and cognition, good recent to remote recall. Lymph node survey: No cervical axillary or inguinal lymphadenopathy noted.  No results found for this or any previous visit (from the past 24 hour(s)).  Disposition: Home   Follow-up Appts:   Follow-up with Dr. PCP,  in 2 weeks.   Tests Needing  Follow-up: None  Signed: Krystyne Tewksbury,LAWAL 03/28/2022, 4:29 PM

## 2022-04-03 ENCOUNTER — Other Ambulatory Visit: Payer: Self-pay | Admitting: Family Medicine

## 2022-04-03 DIAGNOSIS — G894 Chronic pain syndrome: Secondary | ICD-10-CM

## 2022-04-03 DIAGNOSIS — D571 Sickle-cell disease without crisis: Secondary | ICD-10-CM

## 2022-04-03 MED ORDER — XTAMPZA ER 13.5 MG PO C12A: 13.5000 mg | EXTENDED_RELEASE_CAPSULE | Freq: Two times a day (BID) | ORAL | 0 refills | Status: DC

## 2022-04-03 MED ORDER — OXYCODONE HCL 15 MG PO TABS: 15.0000 mg | ORAL_TABLET | Freq: Four times a day (QID) | ORAL | 0 refills | Status: DC | PRN

## 2022-04-03 NOTE — Progress Notes (Signed)
Reviewed PDMP substance reporting system prior to prescribing opiate medications. No inconsistencies noted.  Meds ordered this encounter  Medications   oxyCODONE (ROXICODONE) 15 MG immediate release tablet    Sig: Take 1 tablet (15 mg total) by mouth every 6 (six) hours as needed for pain.    Dispense:  60 tablet    Refill:  0    Order Specific Question:   Supervising Provider    Answer:   Tresa Garter [[id number]]   oxyCODONE ER (XTAMPZA ER) 13.5 MG C12A    Sig: Take 13.5 mg by mouth every 12 (twelve) hours.    Dispense:  14 capsule    Refill:  0    Order Specific Question:   Supervising Provider    Answer:   Tresa Garter [[id number]]   [name]  APRN, MSN, FNP-C [facility] [address] [place], [place] [address] [phone]

## 2022-04-14 ENCOUNTER — Other Ambulatory Visit: Payer: Self-pay

## 2022-04-14 ENCOUNTER — Inpatient Hospital Stay (HOSPITAL_COMMUNITY)
Admission: EM | Admit: 2022-04-14 | Discharge: 2022-04-20 | DRG: 811 | Disposition: A | Discharge status: Home / Self Care | Payer: Medicaid Other | Attending: Internal Medicine | Admitting: Internal Medicine

## 2022-04-14 ENCOUNTER — Emergency Department (HOSPITAL_COMMUNITY): Payer: Medicaid Other

## 2022-04-14 ENCOUNTER — Encounter (HOSPITAL_COMMUNITY): Payer: Self-pay

## 2022-04-14 DIAGNOSIS — D638 Anemia in other chronic diseases classified elsewhere: Secondary | ICD-10-CM | POA: Diagnosis present

## 2022-04-14 DIAGNOSIS — E559 Vitamin D deficiency, unspecified: Secondary | ICD-10-CM | POA: Diagnosis present

## 2022-04-14 DIAGNOSIS — Z23 Encounter for immunization: Secondary | ICD-10-CM | POA: Diagnosis not present

## 2022-04-14 DIAGNOSIS — F1120 Opioid dependence, uncomplicated: Secondary | ICD-10-CM | POA: Diagnosis present

## 2022-04-14 DIAGNOSIS — Z79899 Other long term (current) drug therapy: Secondary | ICD-10-CM | POA: Diagnosis not present

## 2022-04-14 DIAGNOSIS — D5701 Hb-SS disease with acute chest syndrome: Principal | ICD-10-CM

## 2022-04-14 DIAGNOSIS — D5700 Hb-SS disease with crisis, unspecified: Principal | ICD-10-CM | POA: Diagnosis present

## 2022-04-14 DIAGNOSIS — Z91048 Other nonmedicinal substance allergy status: Secondary | ICD-10-CM | POA: Diagnosis not present

## 2022-04-14 DIAGNOSIS — F319 Bipolar disorder, unspecified: Secondary | ICD-10-CM | POA: Diagnosis not present

## 2022-04-14 DIAGNOSIS — F17210 Nicotine dependence, cigarettes, uncomplicated: Secondary | ICD-10-CM | POA: Diagnosis present

## 2022-04-14 DIAGNOSIS — Z9049 Acquired absence of other specified parts of digestive tract: Secondary | ICD-10-CM | POA: Diagnosis not present

## 2022-04-14 DIAGNOSIS — J189 Pneumonia, unspecified organism: Secondary | ICD-10-CM | POA: Diagnosis not present

## 2022-04-14 DIAGNOSIS — G894 Chronic pain syndrome: Secondary | ICD-10-CM | POA: Diagnosis present

## 2022-04-14 DIAGNOSIS — D571 Sickle-cell disease without crisis: Secondary | ICD-10-CM

## 2022-04-14 DIAGNOSIS — I272 Pulmonary hypertension, unspecified: Secondary | ICD-10-CM | POA: Diagnosis present

## 2022-04-14 DIAGNOSIS — F3130 Bipolar disorder, current episode depressed, mild or moderate severity, unspecified: Secondary | ICD-10-CM | POA: Diagnosis present

## 2022-04-14 DIAGNOSIS — D72829 Elevated white blood cell count, unspecified: Secondary | ICD-10-CM | POA: Diagnosis present

## 2022-04-14 DIAGNOSIS — Z91040 Latex allergy status: Secondary | ICD-10-CM | POA: Diagnosis not present

## 2022-04-14 DIAGNOSIS — I2699 Other pulmonary embolism without acute cor pulmonale: Secondary | ICD-10-CM

## 2022-04-14 DIAGNOSIS — G8929 Other chronic pain: Secondary | ICD-10-CM | POA: Diagnosis present

## 2022-04-14 DIAGNOSIS — Z9081 Acquired absence of spleen: Secondary | ICD-10-CM | POA: Diagnosis not present

## 2022-04-14 LAB — CBC WITH DIFFERENTIAL/PLATELET
Abs Immature Granulocytes: 0.08 10*3/uL — ABNORMAL HIGH (ref 0.00–0.07)
Basophils Absolute: 0.1 10*3/uL (ref 0.0–0.1)
Basophils Relative: 1 %
Eosinophils Absolute: 0 10*3/uL (ref 0.0–0.5)
Eosinophils Relative: 0 %
HCT: 19.3 % — ABNORMAL LOW (ref 36.0–46.0)
Hemoglobin: 6.9 g/dL — CL (ref 12.0–15.0)
Immature Granulocytes: 1 %
Lymphocytes Relative: 15 %
Lymphs Abs: 2.2 10*3/uL (ref 0.7–4.0)
MCH: 36.5 pg — ABNORMAL HIGH (ref 26.0–34.0)
MCHC: 35.8 g/dL (ref 30.0–36.0)
MCV: 102.1 fL — ABNORMAL HIGH (ref 80.0–100.0)
Monocytes Absolute: 2.6 10*3/uL — ABNORMAL HIGH (ref 0.1–1.0)
Monocytes Relative: 18 %
Neutro Abs: 9.4 10*3/uL — ABNORMAL HIGH (ref 1.7–7.7)
Neutrophils Relative %: 65 %
Platelets: 408 10*3/uL — ABNORMAL HIGH (ref 150–400)
RBC: 1.89 MIL/uL — ABNORMAL LOW (ref 3.87–5.11)
RDW: 23.2 % — ABNORMAL HIGH (ref 11.5–15.5)
WBC: 14.4 10*3/uL — ABNORMAL HIGH (ref 4.0–10.5)
nRBC: 0.7 % — ABNORMAL HIGH (ref 0.0–0.2)

## 2022-04-14 LAB — RETICULOCYTES
Immature Retic Fract: 28.1 % — ABNORMAL HIGH (ref 2.3–15.9)
RBC.: 1.93 MIL/uL — ABNORMAL LOW (ref 3.87–5.11)
Retic Count, Absolute: 521.9 10*3/uL — ABNORMAL HIGH (ref 19.0–186.0)
Retic Ct Pct: 27 % — ABNORMAL HIGH (ref 0.4–3.1)

## 2022-04-14 LAB — I-STAT BETA HCG BLOOD, ED (MC, WL, AP ONLY): I-stat hCG, quantitative: <5 m[IU]/mL (ref <5)

## 2022-04-14 LAB — COMPREHENSIVE METABOLIC PANEL
ALT: 23 U/L (ref 0–44)
AST: 30 U/L (ref 15–41)
Albumin: 4.6 g/dL (ref 3.5–5.0)
Alkaline Phosphatase: 75 U/L (ref 38–126)
Anion gap: 6 (ref 5–15)
BUN: 6 mg/dL (ref 6–20)
CO2: 21 mmol/L — ABNORMAL LOW (ref 22–32)
Calcium: 9.5 mg/dL (ref 8.9–10.3)
Chloride: 112 mmol/L — ABNORMAL HIGH (ref 98–111)
Creatinine, Ser: 0.38 mg/dL — ABNORMAL LOW (ref 0.44–1.00)
GFR, Estimated: >60 mL/min (ref >60)
Glucose, Bld: 112 mg/dL — ABNORMAL HIGH (ref 70–99)
Potassium: 4.2 mmol/L (ref 3.5–5.1)
Sodium: 139 mmol/L (ref 135–145)
Total Bilirubin: 2 mg/dL — ABNORMAL HIGH (ref 0.3–1.2)
Total Protein: 7.6 g/dL (ref 6.5–8.1)

## 2022-04-14 LAB — PREPARE RBC (CROSSMATCH)

## 2022-04-14 LAB — HEPARIN LEVEL (UNFRACTIONATED): Heparin Unfractionated: 0.22 IU/mL — ABNORMAL LOW (ref 0.30–0.70)

## 2022-04-14 MED ORDER — SODIUM CHLORIDE 0.9% FLUSH
10.0000 mL | Freq: Two times a day (BID) | INTRAVENOUS | Status: DC
  Administered 2022-04-15 – 2022-04-19 (×4): 10 mL

## 2022-04-14 MED ORDER — SODIUM CHLORIDE 0.9 % IV SOLN
1.0000 g | INTRAVENOUS | Status: DC
  Administered 2022-04-14 – 2022-04-18 (×5): 1 g via INTRAVENOUS
  Filled 2022-04-14 (×5): qty 10

## 2022-04-14 MED ORDER — ACETAMINOPHEN 325 MG PO TABS
650.0000 mg | ORAL_TABLET | Freq: Once | ORAL | Status: AC
  Administered 2022-04-15: 650 mg via ORAL
  Filled 2022-04-14: qty 2

## 2022-04-14 MED ORDER — POLYETHYLENE GLYCOL 3350 17 G PO PACK: 17.0000 g | PACK | Freq: Every day | ORAL | Status: DC | PRN

## 2022-04-14 MED ORDER — SODIUM CHLORIDE 0.9% IV SOLUTION: Freq: Once | INTRAVENOUS | Status: DC

## 2022-04-14 MED ORDER — ACETAMINOPHEN 325 MG PO TABS
650.0000 mg | ORAL_TABLET | ORAL | Status: DC | PRN
  Administered 2022-04-14 – 2022-04-19 (×8): 650 mg via ORAL
  Filled 2022-04-14 (×9): qty 2

## 2022-04-14 MED ORDER — DIPHENHYDRAMINE HCL 25 MG PO CAPS
25.0000 mg | ORAL_CAPSULE | ORAL | Status: DC | PRN
  Administered 2022-04-19: 25 mg via ORAL
  Filled 2022-04-14: qty 1

## 2022-04-14 MED ORDER — SODIUM CHLORIDE 0.45 % IV SOLN: INTRAVENOUS | Status: DC

## 2022-04-14 MED ORDER — HYDROMORPHONE HCL 2 MG/ML IJ SOLN
2.0000 mg | INTRAMUSCULAR | Status: AC
  Administered 2022-04-14: 2 mg via INTRAVENOUS
  Filled 2022-04-14: qty 1

## 2022-04-14 MED ORDER — HEPARIN (PORCINE) 25000 UT/250ML-% IV SOLN
1450.0000 [IU]/h | INTRAVENOUS | Status: DC
  Administered 2022-04-14 – 2022-04-15 (×2): 1300 [IU]/h via INTRAVENOUS
  Filled 2022-04-14 (×2): qty 250

## 2022-04-14 MED ORDER — IOHEXOL 350 MG/ML SOLN
100.0000 mL | Freq: Once | INTRAVENOUS | Status: AC | PRN
  Administered 2022-04-14: 60 mL via INTRAVENOUS

## 2022-04-14 MED ORDER — SODIUM CHLORIDE 0.9 % IV SOLN
12.5000 mg | Freq: Once | INTRAVENOUS | Status: AC
  Administered 2022-04-14: 12.5 mg via INTRAVENOUS
  Filled 2022-04-14: qty 12.5

## 2022-04-14 MED ORDER — SODIUM CHLORIDE (PF) 0.9 % IJ SOLN
INTRAMUSCULAR | Status: AC
  Filled 2022-04-14: qty 50

## 2022-04-14 MED ORDER — HEPARIN BOLUS VIA INFUSION
1000.0000 [IU] | Freq: Once | INTRAVENOUS | Status: AC
  Administered 2022-04-14: 1000 [IU] via INTRAVENOUS
  Filled 2022-04-14: qty 1000

## 2022-04-14 MED ORDER — SODIUM CHLORIDE 0.9 % IV SOLN
500.0000 mg | INTRAVENOUS | Status: DC
  Administered 2022-04-14 – 2022-04-15 (×2): 500 mg via INTRAVENOUS
  Filled 2022-04-14 (×3): qty 5

## 2022-04-14 MED ORDER — ONDANSETRON HCL 4 MG/2ML IJ SOLN
4.0000 mg | INTRAMUSCULAR | Status: DC | PRN
  Administered 2022-04-14: 4 mg via INTRAVENOUS
  Filled 2022-04-14: qty 2

## 2022-04-14 MED ORDER — GABAPENTIN 300 MG PO CAPS
300.0000 mg | ORAL_CAPSULE | Freq: Three times a day (TID) | ORAL | Status: DC
  Administered 2022-04-14 – 2022-04-20 (×18): 300 mg via ORAL
  Filled 2022-04-14 (×18): qty 1

## 2022-04-14 MED ORDER — QUETIAPINE FUMARATE 25 MG PO TABS
25.0000 mg | ORAL_TABLET | Freq: Every day | ORAL | Status: DC
  Administered 2022-04-14 – 2022-04-19 (×6): 25 mg via ORAL
  Filled 2022-04-14 (×6): qty 1

## 2022-04-14 MED ORDER — INFLUENZA VAC SPLIT QUAD 0.5 ML IM SUSY: 0.5000 mL | PREFILLED_SYRINGE | INTRAMUSCULAR | Status: DC

## 2022-04-14 MED ORDER — HYDROXYUREA 500 MG PO CAPS
1500.0000 mg | ORAL_CAPSULE | Freq: Every day | ORAL | Status: DC
  Administered 2022-04-14 – 2022-04-20 (×7): 1500 mg via ORAL
  Filled 2022-04-14 (×7): qty 3

## 2022-04-14 MED ORDER — ONDANSETRON HCL 4 MG/2ML IJ SOLN
4.0000 mg | Freq: Four times a day (QID) | INTRAMUSCULAR | Status: DC | PRN
  Administered 2022-04-17: 4 mg via INTRAVENOUS
  Filled 2022-04-14: qty 2

## 2022-04-14 MED ORDER — HEPARIN BOLUS VIA INFUSION
2000.0000 [IU] | Freq: Once | INTRAVENOUS | Status: AC
  Administered 2022-04-14: 2000 [IU] via INTRAVENOUS
  Filled 2022-04-14: qty 2000

## 2022-04-14 MED ORDER — TIZANIDINE HCL 4 MG PO TABS: 4.0000 mg | ORAL_TABLET | Freq: Three times a day (TID) | ORAL | Status: DC

## 2022-04-14 MED ORDER — TIZANIDINE HCL 4 MG PO TABS
4.0000 mg | ORAL_TABLET | Freq: Three times a day (TID) | ORAL | Status: DC | PRN
  Administered 2022-04-19 – 2022-04-20 (×2): 4 mg via ORAL
  Filled 2022-04-14 (×2): qty 1

## 2022-04-14 MED ORDER — SENNOSIDES-DOCUSATE SODIUM 8.6-50 MG PO TABS
1.0000 | ORAL_TABLET | Freq: Two times a day (BID) | ORAL | Status: DC
  Administered 2022-04-14 – 2022-04-20 (×13): 1 via ORAL
  Filled 2022-04-14 (×13): qty 1

## 2022-04-14 MED ORDER — SODIUM CHLORIDE 0.9% FLUSH: 10.0000 mL | INTRAVENOUS | Status: DC | PRN

## 2022-04-14 MED ORDER — HYDROMORPHONE HCL 2 MG/ML IJ SOLN
2.0000 mg | INTRAMUSCULAR | Status: DC | PRN
  Administered 2022-04-14 (×2): 2 mg via INTRAVENOUS
  Filled 2022-04-14 (×2): qty 1

## 2022-04-14 MED ORDER — HYDROMORPHONE HCL 2 MG/ML IJ SOLN
2.0000 mg | INTRAMUSCULAR | Status: AC
  Filled 2022-04-14: qty 1

## 2022-04-14 MED ORDER — HYDROMORPHONE HCL 2 MG/ML IJ SOLN
2.0000 mg | INTRAMUSCULAR | Status: AC
  Administered 2022-04-14: 2 mg via INTRAVENOUS

## 2022-04-14 MED ORDER — DIPHENHYDRAMINE HCL 50 MG/ML IJ SOLN
25.0000 mg | Freq: Once | INTRAMUSCULAR | Status: AC
  Administered 2022-04-15: 25 mg via INTRAVENOUS
  Filled 2022-04-14: qty 1

## 2022-04-14 MED ORDER — FOLIC ACID 1 MG PO TABS
1.0000 mg | ORAL_TABLET | Freq: Every day | ORAL | Status: DC
  Administered 2022-04-14 – 2022-04-20 (×7): 1 mg via ORAL
  Filled 2022-04-14 (×7): qty 1

## 2022-04-14 MED ORDER — HEPARIN (PORCINE) 25000 UT/250ML-% IV SOLN
1150.0000 [IU]/h | INTRAVENOUS | Status: DC
  Administered 2022-04-14: 1150 [IU]/h via INTRAVENOUS
  Filled 2022-04-14: qty 250

## 2022-04-14 MED ORDER — SODIUM CHLORIDE 0.9% FLUSH: 9.0000 mL | INTRAVENOUS | Status: DC | PRN

## 2022-04-14 MED ORDER — CHLORHEXIDINE GLUCONATE CLOTH 2 % EX PADS
6.0000 | MEDICATED_PAD | Freq: Every day | CUTANEOUS | Status: DC
  Administered 2022-04-15 – 2022-04-20 (×7): 6 via TOPICAL

## 2022-04-14 MED ORDER — HYDROMORPHONE 1 MG/ML IV SOLN
INTRAVENOUS | Status: DC
  Administered 2022-04-14: 4 mg via INTRAVENOUS
  Administered 2022-04-14: 10 mg via INTRAVENOUS
  Administered 2022-04-15: 6 mg via INTRAVENOUS
  Administered 2022-04-15: 9 mg via INTRAVENOUS
  Administered 2022-04-15: 8.5 mg via INTRAVENOUS
  Administered 2022-04-15 (×2): 4.5 mg via INTRAVENOUS
  Administered 2022-04-16: 7.5 mg via INTRAVENOUS
  Administered 2022-04-16: 7 mg via INTRAVENOUS
  Administered 2022-04-16: 9 mg via INTRAVENOUS
  Administered 2022-04-16: 2.5 mg via INTRAVENOUS
  Administered 2022-04-16: 7.5 mg via INTRAVENOUS
  Administered 2022-04-16: 13.5 mg via INTRAVENOUS
  Administered 2022-04-17: 4 mg via INTRAVENOUS
  Administered 2022-04-17: 10 mg via INTRAVENOUS
  Administered 2022-04-17: 6.5 mg via INTRAVENOUS
  Administered 2022-04-17: 2.5 mg via INTRAVENOUS
  Administered 2022-04-17: 4 mg via INTRAVENOUS
  Administered 2022-04-18: 6 mg via INTRAVENOUS
  Administered 2022-04-18: 11 mg via INTRAVENOUS
  Administered 2022-04-18: 8 mg via INTRAVENOUS
  Administered 2022-04-18: 5 mg via INTRAVENOUS
  Administered 2022-04-19: 9 mg via INTRAVENOUS
  Administered 2022-04-19: 14.5 mg via INTRAVENOUS
  Administered 2022-04-20: 4.5 mg via INTRAVENOUS
  Filled 2022-04-14 (×9): qty 30

## 2022-04-14 MED ORDER — NALOXONE HCL 0.4 MG/ML IJ SOLN: 0.4000 mg | INTRAMUSCULAR | Status: DC | PRN

## 2022-04-14 NOTE — Progress Notes (Addendum)
ANTICOAGULATION CONSULT NOTE - Initial Consult  Pharmacy Consult for heparin Indication: pulmonary embolus  Allergies  Allergen Reactions   Latex Rash   Wound Dressing Adhesive Rash    Patient Measurements: Height: '5\' 3"'$  (160 cm) Weight: 61 kg (134 lb 7.7 oz) IBW/kg (Calculated) : 52.4 Heparin Dosing Weight: 61  Vital Signs: Temp: 98.2 F (36.8 C) (09/12 0542) BP: 101/62 (09/12 0845) Pulse Rate: 71 (09/12 0845)  Labs: Recent Labs    04/14/22 0738  HGB 6.9*  HCT 19.3*  PLT 408*  CREATININE 0.38*    Estimated Creatinine Clearance: 86.6 mL/min (A) (by C-G formula based on SCr of 0.38 mg/dL (L)).   Medical History: Past Medical History:  Diagnosis Date   Acute cystitis without hematuria 10/21/2019   Blood transfusion without reported diagnosis    Bronchitis    Chickenpox    Depression    Elevated ferritin level 05/2019   Heart murmur    Nausea without vomiting 09/09/2015   Pulmonary hypertension (HCC)    Sickle cell anemia (HCC)    Sickle cell disease, type SS (HCC)    Sickle cell pain crisis ([place]) 12/05/2016   Thrombocytosis 05/2019   Urinary tract infection    Vitamin D deficiency     Medications:  No anticoagulants PTA, on hydrea for sickle cell  Assessment: 26 yo F with left-sided pleuritic chest pain. PMH significant for sickle cell disease.  CTA: exam limited by breath motion artifact but segmental to subsegmental embolus present in the bilateral lower lobes and left upper lobe. Global cardiomegaly. RV LV ratio is borderline, 0.96. No pericardial effusion. Hg low at 6.9, was 8 on 03/26/22 PLT 40i SCr WNL Goal of Therapy:  Heparin level 0.3-0.7 units/ml Monitor platelets by anticoagulation protocol: Yes   Plan:  Heparin 2000 unit bolus and heparin drip 1150 units/hr using [place] Calculator Check heparin level 6 hours after bolus Daily CBC & heparin level F/u for transition to [place] She has PAC & IV team consulted for PIV: if heparin run via PAC  then her heparin levels will need to be drawn peripherally by lab.  Ideally want her heparin to run via PIV and heparin levels drawn by IV team via [place].   Eudelia Bunch, Pharm.D 04/14/2022 9:47 AM

## 2022-04-14 NOTE — H&P (Signed)
H&P  Patient Demographics:  Sandra Mckay, is a 26 y.o. female  MRN: [id number]   DOB - 12/23/1995  Admit Date - 04/14/2022  Outpatient Primary MD for the patient is Dorena Dew, FNP  Chief Complaint  Patient presents with   Sickle Cell Pain Crisis      HPI:   Sandra Mckay  is a 26 y.o. female with a medical history significant for sickle cell disease, chronic pain syndrome, opiate dependence and tolerance, history of polysubstance abuse, and anemia of chronic disease presented to the emergency department with complaints of shortness of breath worsening over the past 2 days.  Also, patient has low back and lower extremity pain that is consistent with her typical sickle cell pain crisis.  Patient states that 2 days ago she developed shortness of breath with exertion, it slowly became hard to breathe with any movements.  Patient has been taking home pain medications that include Xtampza and oxycodone without any sustained relief.  She currently rates pain as 10/10, constant, and occasionally sharp.  Patient is very tearful and accompanied by her mother.  She denies any headache, dizziness, or paresthesias.  No nausea, vomiting, diarrhea, or urinary symptoms.  No sick contacts, recent travel, or known exposure to COVID-19.  ER course: While in the ER, CT angiogram showed pulmonary embolism, positive examination, with segmental to subsegmental embolus present in the bilateral lower lobes and left lower lobe. Currently, patient's hemoglobin is 6.9 g/dL, WBCs 14.4, and platelets 408,000. Comprehensive metabolic panel showed creatinine 0.38, and total bilirubin 2.0, otherwise unremarkable.  Patient's pain persists despite IV Dilaudid, IV fluids.  Patient will be admitted for pulmonary embolism in the setting of sickle cell pain crisis.  Review of systems:   Review of Systems  Constitutional: Negative.   HENT: Negative.    Respiratory:  Positive for shortness of breath.    Cardiovascular:  Positive for chest pain.  Gastrointestinal: Negative.   Genitourinary: Negative.   Musculoskeletal:  Positive for back pain and joint pain.  Skin: Negative.   Neurological: Negative.   Psychiatric/Behavioral: Negative.      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[place])    Sickle cell pain crisis ([place]) 12/05/2016   Thrombocytosis 05/2019   Urinary tract infection    Vitamin D deficiency       Past Surgical History:  Procedure Laterality Date   CHOLECYSTECTOMY  2011   EYE SURGERY     Sty removal   IR IMAGING GUIDED PORT INSERTION  03/06/2022   LABIAL ADHESION LYSIS  1999   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[place] 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   TONSILLECTOMY  2012     Social History:   Social History   Tobacco Use   Smoking status: Some Days    Packs/day: 1.00    Types: Cigarettes   Smokeless tobacco: Never   Tobacco comments:    1 pack twice a week  Substance Use Topics   Alcohol use: Yes    Alcohol/week: 0.0 standard drinks of alcohol    Comment: occ  Lives - At home   Family History :   Family History  Problem Relation Age of Onset   Arthritis Other        grandparent   Stroke Other    Hypertension Other    Diabetes Other        grandparent   Cancer - Other Other        Glioblastoma     Home Medications:   Prior to Admission medications   Medication Sig Start Date End Date Taking? Authorizing Provider  folic acid (FOLVITE) 1 MG tablet Take 1 tablet (1 mg total) by mouth daily. Patient not taking: Reported on 08/06/2021 03/23/18   Azzie Glatter, FNP  gabapentin (NEURONTIN) 100 MG  capsule Take 3 capsules (300 mg total) by mouth 3 (three) times daily. 02/26/22   Dorena Dew, FNP  hydroxyurea (HYDREA) 500 MG capsule TAKE 3 CAPSULES (1,500 MG TOTAL) BY MOUTH DAILY. TAKE 3 CAPSULES (1,500 MG TOTAL) BY MOUTH DAILY. Patient taking differently: Take 1,500 mg by mouth daily. TAKE 3 CAPSULES BY MOUTH DAILY. 10/20/21   Vevelyn Francois, NP  ibuprofen (ADVIL) 800 MG tablet Take 1 tablet (800 mg total) by mouth every 8 (eight) hours as needed. 02/28/21   Vevelyn Francois, NP  Melatonin 10 MG CAPS Take 20 mg by mouth at bedtime as needed. Patient not taking: Reported on 02/26/2022 07/25/21   Vevelyn Francois, NP  ondansetron (ZOFRAN) 4 MG tablet Take 1 tablet (4 mg total) by mouth every 8 (eight) hours as needed for nausea or vomiting. 02/28/21   Vevelyn Francois, NP  oxyCODONE (ROXICODONE) 15 MG immediate release tablet Take 1 tablet (15 mg total) by mouth every 6 (six) hours as needed for pain. 04/06/22   Dorena Dew, FNP  oxyCODONE ER [facility] ER) 13.5 MG C12A Take 13.5 mg by mouth every 12 (twelve) hours. 04/03/22   Dorena Dew, FNP  QUEtiapine (SEROQUEL) 25 MG tablet Take 1 tablet (25 mg total) by mouth at bedtime. Patient taking differently: Take 25 mg by mouth daily. 05/08/21 05/08/22  Vevelyn Francois, NP  tiZANidine (ZANAFLEX) 4 MG tablet Take 1 tablet (4 mg total) by mouth 3 (three) times daily. Patient taking differently: Take 4 mg by mouth every 8 (eight) hours as needed for muscle spasms. 06/16/21 06/16/22  Vevelyn Francois, NP  Vitamin D, Ergocalciferol, (DRISDOL) 1.25 MG (50000 UNIT) CAPS capsule TAKE 1 CAPSULE (50,000 UNITS TOTAL) BY MOUTH EVERY 7 (SEVEN) DAYS FOR 8 DOSES. 02/23/22 04/14/22  Fenton Foy, NP     Allergies:   Allergies  Allergen Reactions   Latex Rash   Wound Dressing Adhesive Rash     Physical Exam:   Vitals:   Vitals:   04/14/22 1505 04/14/22 1743  BP:  116/65  Pulse:  84  Resp: 18 16  Temp:  97.8 F (36.6 C)  SpO2: 97% 98%     Physical Exam: Constitutional: Patient appears well-developed and well-nourished. Writhing in pain, acute distress. HENT: Normocephalic, atraumatic, External right and left ear normal. Oropharynx is clear and moist.  Eyes: Conjunctivae and EOM are normal. PERRLA, no scleral icterus. Neck: Normal ROM. Neck supple. No JVD. No tracheal deviation. No thyromegaly. CVS: RRR, S1/S2 +, no murmurs, no gallops, no carotid bruit.  Pulmonary: Increased work of breathing, no stridor, rhonchi, wheezes, rales.  Abdominal: Soft. BS +, no distension, tenderness, rebound or guarding.  Musculoskeletal: Normal range of motion. No edema and no tenderness.  Lymphadenopathy:  No lymphadenopathy noted, cervical, inguinal or axillary Neuro: Alert. Normal reflexes, muscle tone coordination. No cranial nerve deficit. Skin: Skin is warm and dry. No rash noted. Not diaphoretic. No erythema. No pallor. Psychiatric: Normal mood and affect. Behavior, judgment, thought content normal.   Data Review:   CBC Recent Labs  Lab 04/14/22 0738  WBC 14.4*  HGB 6.9*  HCT 19.3*  PLT 408*  MCV 102.1*  MCH 36.5*  MCHC 35.8  RDW 23.2*  LYMPHSABS 2.2  MONOABS 2.6*  EOSABS 0.0  BASOSABS 0.1   ------------------------------------------------------------------------------------------------------------------  Chemistries  Recent Labs  Lab 04/14/22 0738  NA 139  K 4.2  CL 112*  CO2 21*  GLUCOSE 112*  BUN 6  CREATININE 0.38*  CALCIUM 9.5  AST 30  ALT 23  ALKPHOS 75  BILITOT 2.0*   ------------------------------------------------------------------------------------------------------------------ estimated creatinine clearance is 86.6 mL/min (A) (by C-G formula based on SCr of 0.38 mg/dL (L)). ------------------------------------------------------------------------------------------------------------------ No results for input(s): "TSH", "T4TOTAL", "T3FREE", "THYROIDAB" in the last 72 hours.  Invalid input(s):  "FREET3"  Coagulation profile No results for input(s): "INR", "PROTIME" in the last 168 hours. ------------------------------------------------------------------------------------------------------------------- No results for input(s): "DDIMER" in the last 72 hours. -------------------------------------------------------------------------------------------------------------------  Cardiac Enzymes No results for input(s): "CKMB", "TROPONINI", "MYOGLOBIN" in the last 168 hours.  Invalid input(s): "CK" ------------------------------------------------------------------------------------------------------------------ No results found for: "BNP"  ---------------------------------------------------------------------------------------------------------------  Urinalysis    Component Value Date/Time   COLORURINE YELLOW 10/29/2021 1922   APPEARANCEUR CLEAR 10/29/2021 1922   LABSPEC 1.005 10/29/2021 1922   PHURINE 6.0 10/29/2021 1922   GLUCOSEU NEGATIVE 10/29/2021 1922   HGBUR SMALL (A) 10/29/2021 [place] NEGATIVE 10/29/2021 1922   BILIRUBINUR neg 08/06/2020 [place] 10/29/2021 1922   PROTEINUR NEGATIVE 10/29/2021 1922   UROBILINOGEN 0.2 08/06/2020 1525   UROBILINOGEN >=8.0 08/04/2017 1144   NITRITE NEGATIVE 10/29/2021 1922   LEUKOCYTESUR NEGATIVE 10/29/2021 1922    ----------------------------------------------------------------------------------------------------------------   Imaging Results:    CT Angio Chest PE W/Cm &/Or Wo Cm  Result Date: 04/14/2022 CLINICAL DATA:  PE suspected, history of sickle cell disease EXAM: CT ANGIOGRAPHY CHEST WITH CONTRAST TECHNIQUE: Multidetector CT imaging of the chest was performed using the standard protocol during bolus administration of intravenous contrast. Multiplanar CT image reconstructions and MIPs were obtained to evaluate the vascular anatomy. RADIATION DOSE REDUCTION: This exam was performed according to the  departmental dose-optimization program which includes automated exposure control, adjustment of the mA and/or kV according to patient size and/or use of iterative reconstruction technique. CONTRAST:  18m OMNIPAQUE IOHEXOL 350 MG/ML SOLN COMPARISON:  None Available. FINDINGS: Cardiovascular: Right chest port catheter. Examination for pulmonary embolism is substantially limited by breath motion artifact. Within this limitation, positive examination, with segmental to subsegmental embolus present in the bilateral lower lobes (series 6, image 161) and left upper lobe (series 6, image 106). Global cardiomegaly. RV LV ratio is borderline, 0.96. No pericardial effusion. Mediastinum/Nodes: No enlarged mediastinal, hilar, or axillary lymph nodes. Thymic remnant in the anterior mediastinum. Thyroid gland, trachea, and esophagus demonstrate no significant findings. Lungs/Pleura: Heterogeneous and ground-glass airspace opacity in the bilateral lung bases with trace left pleural effusion (series 7, image 89). Upper Abdomen: No acute abnormality. Musculoskeletal: No chest wall abnormality. No acute osseous findings. Review of the MIP images confirms the above findings. IMPRESSION: 1. Examination for pulmonary embolism is substantially limited by breath motion artifact. Within this limitation, positive examination, with segmental to subsegmental embolus present in the bilateral lower lobes and left upper lobe. 2.  Mild global cardiomegaly.  RV LV ratio is borderline at 0.96. 3. Heterogeneous and ground-glass airspace opacity in the bilateral lung bases with trace left pleural effusion, generally in keeping with clinical concern for acute chest in the setting of sickle cell disease. These results were called by telephone at the time of interpretation on 04/14/2022 at 9:28 am to Dr. Lacretia Leigh , who verbally acknowledged these results. Electronically Signed   By: Delanna Ahmadi M.D.   On: 04/14/2022 09:30   DG Chest 2  View  Result Date: 04/14/2022 CLINICAL DATA:  Sickle cell crisis.  Dyspnea. EXAM: CHEST - 2 VIEW COMPARISON:  08/06/2021 FINDINGS: Streaky opacities are seen in both lung bases, new in the interval. No pulmonary edema or substantial pleural effusion. The cardio pericardial silhouette is enlarged. Right Port-A-Cath again noted. Telemetry leads overlie the chest. IMPRESSION: New streaky opacities in both lung bases. In the appropriate clinical setting, findings could be compatible with acute chest syndrome in this patient with a history of sickle cell disease. Electronically Signed   By: Misty Stanley M.D.   On: 04/14/2022 07:37     Assessment & Plan:  Principal Problem:   Sickle cell pain crisis ([place]) Active Problems:   Bipolar and related disorder (HCC)   Chronic pain   Leukocytosis   Pulmonary embolism (HCC)  Sickle cell disease with pain crisis: Admit to telemetry.  Patient is having a significant amount of pain primarily to chest, back, and upper extremities.  Initiate IV Dilaudid PCA with weight-based settings. Toradol 15 mg IV every 6 hours OxyContin 15 mg every 12 hours We will hold oxycodone and use PCA Dilaudid.  Will restart as pain intensity improves. Monitor vital signs very closely, reevaluate pain scale regularly, and supplemental oxygen as needed  Pulmonary embolism: CT angiogram shows pulmonary embolism with segmental to subsegmental embolus present in the bilateral lower lobes and left upper lobe.  Initiate heparin drip per pharmacy consult.  Anemia of chronic disease: Hemoglobin is 6.9 g/dL, which is decreased from baseline of 8-9 g/dL.  We will transfuse 1 unit PRBCs.  Monitor closely.  Follow labs in AM.  Worsening headache: Patient has worsening frontal headache.  Headache is worsened by changing positions.  Has been unrelieved by pain medications.  Will review CT of head as results become available.  Continue with pain management.  Chronic pain syndrome: Continue  home medications  Bipolar depression: We will continue patient's home medications.  She denies any suicidal or homicidal intentions today.  We will continue to monitor closely.  Leukocytosis: WBCs elevated.  More than likely secondary to vaso-occlusive pain crisis.  We will continue to monitor closely without antibiotics at this time.  Follow labs in AM.  DVT Prophylaxis: SCDs  AM Labs Ordered, also please review Full Orders  Family Communication: Admission, patient's condition and plan of care including tests being ordered have been discussed with the patient who indicate understanding and agree with the plan and Code Status.  Code Status: Full Code  Consults called: None    Admission status: Inpatient    Time spent in minutes : 50 minutes  [place], MSN, FNP-C [facility] [address] [place], [place] [id number] [phone]  04/14/2022 at 6:11 PM

## 2022-04-14 NOTE — ED Provider Notes (Signed)
[facility] DEPT Provider Note   CSN: [id number] Arrival date & time: 04/14/22  0530     History  Chief Complaint  Patient presents with   Sickle Cell Pain Crisis    Sandra Mckay is a 26 y.o. female.  26 year old female presents with left-sided pleuritic chest pain.  History of sickle cell disease and is similar to her prior pain crisis.  No fever or chills.  Slight dyspnea noted.  No vomiting or diarrhea.  Pain unrelieved with home medications.       Home Medications Prior to Admission medications   Medication Sig Start Date End Date Taking? Authorizing Provider  folic acid (FOLVITE) 1 MG tablet Take 1 tablet (1 mg total) by mouth daily. Patient not taking: Reported on 08/06/2021 03/23/18   Azzie Glatter, FNP  gabapentin (NEURONTIN) 100 MG capsule Take 3 capsules (300 mg total) by mouth 3 (three) times daily. 02/26/22   Dorena Dew, FNP  hydroxyurea (HYDREA) 500 MG capsule TAKE 3 CAPSULES (1,500 MG TOTAL) BY MOUTH DAILY. TAKE 3 CAPSULES (1,500 MG TOTAL) BY MOUTH DAILY. Patient taking differently: Take 1,500 mg by mouth daily. TAKE 3 CAPSULES BY MOUTH DAILY. 10/20/21   Vevelyn Francois, NP  ibuprofen (ADVIL) 800 MG tablet Take 1 tablet (800 mg total) by mouth every 8 (eight) hours as needed. 02/28/21   Vevelyn Francois, NP  Melatonin 10 MG CAPS Take 20 mg by mouth at bedtime as needed. Patient not taking: Reported on 02/26/2022 07/25/21   Vevelyn Francois, NP  ondansetron (ZOFRAN) 4 MG tablet Take 1 tablet (4 mg total) by mouth every 8 (eight) hours as needed for nausea or vomiting. 02/28/21   Vevelyn Francois, NP  oxyCODONE (ROXICODONE) 15 MG immediate release tablet Take 1 tablet (15 mg total) by mouth every 6 (six) hours as needed for pain. 04/06/22   Dorena Dew, FNP  oxyCODONE ER [facility] ER) 13.5 MG C12A Take 13.5 mg by mouth every 12 (twelve) hours. 04/03/22   Dorena Dew, FNP  QUEtiapine (SEROQUEL) 25 MG tablet Take 1 tablet (25 mg  total) by mouth at bedtime. Patient taking differently: Take 25 mg by mouth daily. 05/08/21 05/08/22  Vevelyn Francois, NP  tiZANidine (ZANAFLEX) 4 MG tablet Take 1 tablet (4 mg total) by mouth 3 (three) times daily. Patient taking differently: Take 4 mg by mouth every 8 (eight) hours as needed for muscle spasms. 06/16/21 06/16/22  Vevelyn Francois, NP  Vitamin D, Ergocalciferol, (DRISDOL) 1.25 MG (50000 UNIT) CAPS capsule TAKE 1 CAPSULE (50,000 UNITS TOTAL) BY MOUTH EVERY 7 (SEVEN) DAYS FOR 8 DOSES. 02/23/22 04/14/22  Fenton Foy, NP      Allergies    Latex and Wound dressing adhesive    Review of Systems   Review of Systems  All other systems reviewed and are negative.   Physical Exam Updated Vital Signs BP 121/77   Pulse 84   Temp 98.2 F (36.8 C)   Resp (!) 25   Ht 1.6 m ('5\' 3"'$ )   Wt 61 kg   SpO2 98%   BMI 23.82 kg/m  Physical Exam Vitals and nursing note reviewed.  Constitutional:      General: She is not in acute distress.    Appearance: Normal appearance. She is well-developed. She is not toxic-appearing.  HENT:     Head: Normocephalic and atraumatic.  Eyes:     General: Lids are normal.     Conjunctiva/sclera: Conjunctivae  normal.     Pupils: Pupils are equal, round, and reactive to light.  Neck:     Thyroid: No thyroid mass.     Trachea: No tracheal deviation.  Cardiovascular:     Rate and Rhythm: Normal rate and regular rhythm.     Heart sounds: Normal heart sounds. No murmur heard.    No gallop.  Pulmonary:     Effort: Pulmonary effort is normal. No respiratory distress.     Breath sounds: Normal breath sounds. No stridor. No decreased breath sounds, wheezing, rhonchi or rales.  Abdominal:     General: There is no distension.     Palpations: Abdomen is soft.     Tenderness: There is no abdominal tenderness. There is no rebound.  Musculoskeletal:        General: No tenderness. Normal range of motion.     Cervical back: Normal range of motion and neck  supple.  Skin:    General: Skin is warm and dry.     Findings: No abrasion or rash.  Neurological:     Mental Status: She is alert and oriented to person, place, and time. Mental status is at baseline.     GCS: GCS eye subscore is 4. GCS verbal subscore is 5. GCS motor subscore is 6.     Cranial Nerves: No cranial nerve deficit.     Sensory: No sensory deficit.     Motor: Motor function is intact.  Psychiatric:        Attention and Perception: Attention normal.        Speech: Speech normal.        Behavior: Behavior normal.     ED Results / Procedures / Treatments   Labs (all labs ordered are listed, but only abnormal results are displayed) Labs Reviewed  CBC WITH DIFFERENTIAL/PLATELET  COMPREHENSIVE METABOLIC PANEL  RETICULOCYTES  I-STAT BETA HCG BLOOD, ED ([facility], [facility], [facility] ONLY)    EKG None  Radiology No results found.  Procedures Procedures    Medications Ordered in ED Medications  0.45 % sodium chloride infusion (has no administration in time range)  HYDROmorphone (DILAUDID) injection 2 mg (has no administration in time range)  HYDROmorphone (DILAUDID) injection 2 mg (has no administration in time range)  HYDROmorphone (DILAUDID) injection 2 mg (has no administration in time range)  diphenhydrAMINE (BENADRYL) 12.5 mg in sodium chloride 0.9 % 50 mL IVPB (has no administration in time range)  ondansetron (ZOFRAN) injection 4 mg (has no administration in time range)    ED Course/ Medical Decision Making/ A&P                           Medical Decision Making Amount and/or Complexity of Data Reviewed Radiology: ordered.  Risk Prescription drug management.   Patient presented in severe pain and was given IV fluids along with IV analgesics.  Her chest x-rays concerning for possible infiltrate.  CT of the chest performed per my interpretation shows evidence of pulmonary embolus as well as acute chest indrawing.  Patient heparinized per pharmacy.  Started on IV dose of  antibiotics.  Patient is not severely hypoxic at this time.  Discussed with sickle cell provider and patient to be admitted.  CRITICAL CARE Performed by: Leota Jacobsen Total critical care time: 50 minutes Critical care time was exclusive of separately billable procedures and treating other patients. Critical care was necessary to treat or prevent imminent or life-threatening deterioration. Critical care was time  spent personally by me on the following activities: development of treatment plan with patient and/or surrogate as well as nursing, discussions with consultants, evaluation of patient's response to treatment, examination of patient, obtaining history from patient or surrogate, ordering and performing treatments and interventions, ordering and review of laboratory studies, ordering and review of radiographic studies, pulse oximetry and re-evaluation of patient's condition.         Final Clinical Impression(s) / ED Diagnoses Final diagnoses:  None    Rx / DC Orders ED Discharge Orders     None         Lacretia Leigh, MD 04/14/22 [phone]

## 2022-04-14 NOTE — Progress Notes (Signed)
Patient admitted to 1605 in 10/10 left sided pleuritic CP. Provider at bedside and ordered PCA. PCA started along with monitoring of tele, CO2, and SpO2. Patient oriented to unit and call bell. Family at bedside. Patient expressing some pain relief.

## 2022-04-14 NOTE — Progress Notes (Signed)
Pharmacy Brief Note - Evening Anticoagulation Follow Up:  Patient is a 24 yoF on heparin for acute PE. PMH significant for sickle cell anemia. For full history, see note by Leodis Sias, PharmD from earlier today.   Assessment: HL = 0.22 is subtherapeutic on heparin infusion of 1150 units/hr Confirmed with RN that heparin infusing at correct rate. No line issues. No signs of bleeding.  Hgb of 6.9 is lower than baseline of 8-9, but no signs of bleeding.  Confirmed that heparin infusing via PIV, HL obtained from PAC  Goal: HL 0.3 - 0.7  Plan: Heparin bolus of 1000 units IV once Increase heparin infusion to 1300 units/hr Check HL 6 hours after rate change. CBC, HL daily Monitor for signs of bleeding  Lenis Noon, PharmD 04/14/22 2:01 PM

## 2022-04-14 NOTE — ED Triage Notes (Signed)
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

## 2022-04-15 ENCOUNTER — Inpatient Hospital Stay (HOSPITAL_COMMUNITY): Payer: Medicaid Other

## 2022-04-15 DIAGNOSIS — D5700 Hb-SS disease with crisis, unspecified: Secondary | ICD-10-CM | POA: Diagnosis not present

## 2022-04-15 LAB — CBC
HCT: 20.7 % — ABNORMAL LOW (ref 36.0–46.0)
Hemoglobin: 7.4 g/dL — ABNORMAL LOW (ref 12.0–15.0)
MCH: 34.7 pg — ABNORMAL HIGH (ref 26.0–34.0)
MCHC: 35.7 g/dL (ref 30.0–36.0)
MCV: 97.2 fL (ref 80.0–100.0)
Platelets: 352 10*3/uL (ref 150–400)
RBC: 2.13 MIL/uL — ABNORMAL LOW (ref 3.87–5.11)
RDW: 20.5 % — ABNORMAL HIGH (ref 11.5–15.5)
WBC: 19.8 10*3/uL — ABNORMAL HIGH (ref 4.0–10.5)
nRBC: 0.3 % — ABNORMAL HIGH (ref 0.0–0.2)

## 2022-04-15 LAB — HEPARIN LEVEL (UNFRACTIONATED)
Heparin Unfractionated: 0.12 IU/mL — ABNORMAL LOW (ref 0.30–0.70)
Heparin Unfractionated: 0.25 IU/mL — ABNORMAL LOW (ref 0.30–0.70)
Heparin Unfractionated: 0.26 IU/mL — ABNORMAL LOW (ref 0.30–0.70)
Heparin Unfractionated: 0.32 IU/mL (ref 0.30–0.70)
Heparin Unfractionated: <0.1 IU/mL — ABNORMAL LOW (ref 0.30–0.70)

## 2022-04-15 MED ORDER — HEPARIN BOLUS VIA INFUSION
2000.0000 [IU] | Freq: Once | INTRAVENOUS | Status: AC
  Administered 2022-04-15: 2000 [IU] via INTRAVENOUS
  Filled 2022-04-15: qty 2000

## 2022-04-15 MED ORDER — HYDROMORPHONE HCL 1 MG/ML IJ SOLN
0.5000 mg | Freq: Once | INTRAMUSCULAR | Status: AC
  Administered 2022-04-15: 0.5 mg via INTRAVENOUS
  Filled 2022-04-15: qty 0.5

## 2022-04-15 MED ORDER — HEPARIN BOLUS VIA INFUSION
1000.0000 [IU] | Freq: Once | INTRAVENOUS | Status: AC
  Administered 2022-04-15: 1000 [IU] via INTRAVENOUS
  Filled 2022-04-15: qty 1000

## 2022-04-15 MED ORDER — SODIUM CHLORIDE 0.9 % IV BOLUS
500.0000 mL | Freq: Once | INTRAVENOUS | Status: AC
  Administered 2022-04-15: 500 mL via INTRAVENOUS

## 2022-04-15 MED ORDER — HEPARIN (PORCINE) 25000 UT/250ML-% IV SOLN
1800.0000 [IU]/h | INTRAVENOUS | Status: DC
  Administered 2022-04-15: 1650 [IU]/h via INTRAVENOUS
  Filled 2022-04-15: qty 250

## 2022-04-15 NOTE — Progress Notes (Addendum)
    OVERNIGHT PROGRESS REPORT  Notified by RN and RR RN for Tachycardia. Assessment yielded elevated Temp PCA locked out due to Respiratory rate elevation.  IV dose ordered for that consideration.  Temperature control initiated. Antibiotics are previously ordered    UPDATE 04/15/22 2208 hrs  Hr after IVF , Tylenol and cooling therapies has dropped to ST 118, Temp 101.79F   BP 125/85  RR 18 SPO 93%       Gershon Cull MSNA MSN ACNPC-AG Acute Care Nurse Practitioner [place]

## 2022-04-15 NOTE — Progress Notes (Signed)
ANTICOAGULATION CONSULT NOTE   Pharmacy Consult for heparin Indication: pulmonary embolus  Allergies  Allergen Reactions   Latex Rash   Wound Dressing Adhesive Rash    Patient Measurements: Height: '5\' 3"'$  (160 cm) Weight: 61 kg (134 lb 7.7 oz) IBW/kg (Calculated) : 52.4 Heparin Dosing Weight: 61  Vital Signs: Temp: 99.1 F (37.3 C) (09/12 2154) Temp Source: Oral (09/12 2154) BP: 113/67 (09/12 2154) Pulse Rate: 87 (09/12 2154)  Labs: Recent Labs    04/14/22 0738 04/14/22 1729 04/15/22 0046  HGB 6.9*  --   --   HCT 19.3*  --   --   PLT 408*  --   --   HEPARINUNFRC  --  0.22* 0.25*  CREATININE 0.38*  --   --      Estimated Creatinine Clearance: 86.6 mL/min (A) (by C-G formula based on SCr of 0.38 mg/dL (L)).   Medical History: Past Medical History:  Diagnosis Date   Acute cystitis without hematuria 10/21/2019   Blood transfusion without reported diagnosis    Bronchitis    Chickenpox    Depression    Elevated ferritin level 05/2019   Heart murmur    Nausea without vomiting 09/09/2015   Pulmonary hypertension (HCC)    Sickle cell anemia (HCC)    Sickle cell disease, type SS (HCC)    Sickle cell pain crisis ([place]) 12/05/2016   Thrombocytosis 05/2019   Urinary tract infection    Vitamin D deficiency     Medications:  No anticoagulants PTA, on hydrea for sickle cell  Assessment: 26 yo F with left-sided pleuritic chest pain. PMH significant for sickle cell disease.  CTA: exam limited by breath motion artifact but segmental to subsegmental embolus present in the bilateral lower lobes and left upper lobe. Global cardiomegaly. RV LV ratio is borderline, 0.96. No pericardial effusion.  04/15/22 Heparin level = 0.25 (subtherapeutic) with heparin gtt @ 1300 units/hr RN confirmed no interruption in therapy and line issues No bleeding complications of therapy noted  Goal of Therapy:  Heparin level 0.3-0.7 units/ml Monitor platelets by anticoagulation protocol: Yes    Plan:  Heparin 1000 unit bolus IV x 1 Increase heparin drip to 1450 units/hr  Check heparin level 6 hours after heparin rate increase Daily CBC & heparin level F/u for transition to [place] She has PAC & IV team consulted for PIV: if heparin run via PAC then her heparin levels will need to be drawn peripherally by lab.  Ideally want her heparin to run via PIV and heparin levels drawn by IV team via [place].   Leone Haven, PharmD 04/15/2022 1:40 AM

## 2022-04-15 NOTE — Significant Event (Addendum)
Rapid Response Event Note   Reason for Call :  Fever, tachycardia in 160's, uncontrolled pain on PCA.  Initial Focused Assessment:  Patient is alert and oriented x 4, following all commands, reporting pain at 9/10 severity, respiratory rate in upper 30's, saturation 93-96% on 3L/[place]. Lung sounds are clear, but patient has productive cough with thick yellow/white phlegm. (On antibiotics and recently had pneumonia per mother who is at bedside.) Heart sounds rapid, but regular. No peripheral edema seen, good pulses in all four extremities, but on heparin drip for known pulmonary embolism. Patient urinating often, on her menstrual cycle, and has not had BM in few days. Due to rapid respiratory rate, PCA is not administering every requested dose. Patient reports poor appetite, but able to drink liquids and swallow without difficulty. Patient has been getting OOB to [facility], but this is significantly worsening her HR and RR.   Interventions:  VS repeated, assessment as above. Communicated with on call provider, Gershon Cull, NP throughout event. EKG completed and showed sinus tachycardia. Additional dose of Dilaudid 0.'5mg'$  IV push x 1 administered as prescribed. Tylenol previously given by bedside RN Larene Beach. Ice packs applied, room cooled, and blankets removed with rationale explained to patient/mother. IV fluid bolus of NS 540m administered as prescribed. Bedside RN administering scheduled meds as documented on MAR that include Gabapentin. Advised to utilize bedpan for now to limit exertional dyspnea and tachycardia.  Plan of Care:  Monitor VS and response to interventions closely, If no improvement, may require increased level of care. Patient, mother, bedside staff, and provider all agree to current plan of care.  Event Summary:   MD Notified: 2020 Call Time: 2019 Arrival Time: 2029 End Time: 2152  ASelinda Michaels RN

## 2022-04-15 NOTE — Progress Notes (Signed)
IVT consulted for DBIV at 1400.  Advised pharmacist, Larkin Ina that the 6th fl draw's their blood from central lines.

## 2022-04-15 NOTE — Progress Notes (Signed)
Subjective: Sandra Mckay is a 26 year old female with a medical history significant for sickle cell disease, chronic pain syndrome, opiate dependence and tolerance, bipolar depression, and history of anemia of chronic disease was admitted for pulmonary embolism in the setting of sickle cell pain crisis. Sandra Mckay continues to be in a significant amount of pain.  She rates her pain as 9/10.  She states that it has been difficult to breathe.  Patient endorses shortness of breath and chest pain.  Also, increased pain to upper and lower back.  She characterizes pain as constant and sharp.  Patient is very tearful today. She denies blurry vision, dizziness, urinary symptoms, nausea, vomiting, or diarrhea.  Patient continues to endorse headache, however CT of head is negative for any intracranial abnormalities.  Objective:  Vital signs in last 24 hours:  Vitals:   04/15/22 1215 04/15/22 1432 04/15/22 1548 04/15/22 1836  BP:  125/82  131/72  Pulse:  (!) 137  (!) 123  Resp: '18 18 20 18  '$ Temp:  99.4 F (37.4 C)  99.7 F (37.6 C)  TempSrc:  Oral  Oral  SpO2: 90% 100% 93% 96%  Weight:      Height:        Intake/Output from previous day:   Intake/Output Summary (Last 24 hours) at 04/15/2022 1913 Last data filed at 04/15/2022 1830 Gross per 24 hour  Intake 2590.5 ml  Output 1550 ml  Net 1040.5 ml    Physical Exam Constitutional:      Appearance: Normal appearance. She is ill-appearing.  Eyes:     Pupils: Pupils are equal, round, and reactive to light.  Cardiovascular:     Rate and Rhythm: Tachycardia present.     Heart sounds: No murmur heard. Pulmonary:     Effort: Respiratory distress present.     Breath sounds: No stridor. No rhonchi.  Chest:     Chest wall: No tenderness.  Abdominal:     General: Bowel sounds are normal.  Musculoskeletal:        General: Normal range of motion.  Skin:    General: Skin is warm.  Psychiatric:        Mood and Affect: Mood is depressed.  Affect is tearful.        Behavior: Behavior normal.     Lab Results:  Basic Metabolic Panel:    Component Value Date/Time   NA 139 04/14/2022 0738   NA 138 01/14/2022 1136   K 4.2 04/14/2022 0738   CL 112 (H) 04/14/2022 0738   CO2 21 (L) 04/14/2022 0738   BUN 6 04/14/2022 0738   BUN 9 01/14/2022 1136   CREATININE 0.38 (L) 04/14/2022 0738   CREATININE 0.53 05/31/2017 1145   GLUCOSE 112 (H) 04/14/2022 0738   CALCIUM 9.5 04/14/2022 0738   CBC:    Component Value Date/Time   WBC 19.8 (H) 04/15/2022 0800   HGB 7.4 (L) 04/15/2022 0800   HGB 9.5 (L) 01/14/2022 1136   HCT 20.7 (L) 04/15/2022 0800   HCT 26.9 (L) 01/14/2022 1136   PLT 352 04/15/2022 0800   PLT 452 (H) 01/14/2022 1136   MCV 97.2 04/15/2022 0800   MCV 101 (H) 01/14/2022 1136   NEUTROABS 9.4 (H) 04/14/2022 0738   NEUTROABS 6.1 01/14/2022 1136   LYMPHSABS 2.2 04/14/2022 0738   LYMPHSABS 1.9 01/14/2022 1136   MONOABS 2.6 (H) 04/14/2022 0738   EOSABS 0.0 04/14/2022 0738   EOSABS 0.1 01/14/2022 1136   BASOSABS 0.1 04/14/2022 0175  BASOSABS 0.1 01/14/2022 1136    No results found for this or any previous visit (from the past 240 hour(s)).  Studies/Results: CT HEAD WO CONTRAST (5MM)  Result Date: 04/15/2022 CLINICAL DATA:  Headache EXAM: CT HEAD WITHOUT CONTRAST TECHNIQUE: Contiguous axial images were obtained from the base of the skull through the vertex without intravenous contrast. RADIATION DOSE REDUCTION: This exam was performed according to the departmental dose-optimization program which includes automated exposure control, adjustment of the mA and/or kV according to patient size and/or use of iterative reconstruction technique. COMPARISON:  None Available. FINDINGS: Brain: No evidence of acute infarction, hemorrhage, hydrocephalus, extra-axial collection or mass lesion/mass effect. Image quality degraded by motion Vascular: Negative for hyperdense vessel Skull: Negative Sinuses/Orbits: Paranasal sinuses clear.   Normal orbit Other: None IMPRESSION: Negative CT head Motion degraded study. Electronically Signed   By: Franchot Gallo M.D.   On: 04/15/2022 11:43   CT Angio Chest PE W/Cm &/Or Wo Cm  Result Date: 04/14/2022 CLINICAL DATA:  PE suspected, history of sickle cell disease EXAM: CT ANGIOGRAPHY CHEST WITH CONTRAST TECHNIQUE: Multidetector CT imaging of the chest was performed using the standard protocol during bolus administration of intravenous contrast. Multiplanar CT image reconstructions and MIPs were obtained to evaluate the vascular anatomy. RADIATION DOSE REDUCTION: This exam was performed according to the departmental dose-optimization program which includes automated exposure control, adjustment of the mA and/or kV according to patient size and/or use of iterative reconstruction technique. CONTRAST:  70m OMNIPAQUE IOHEXOL 350 MG/ML SOLN COMPARISON:  None Available. FINDINGS: Cardiovascular: Right chest port catheter. Examination for pulmonary embolism is substantially limited by breath motion artifact. Within this limitation, positive examination, with segmental to subsegmental embolus present in the bilateral lower lobes (series 6, image 161) and left upper lobe (series 6, image 106). Global cardiomegaly. RV LV ratio is borderline, 0.96. No pericardial effusion. Mediastinum/Nodes: No enlarged mediastinal, hilar, or axillary lymph nodes. Thymic remnant in the anterior mediastinum. Thyroid gland, trachea, and esophagus demonstrate no significant findings. Lungs/Pleura: Heterogeneous and ground-glass airspace opacity in the bilateral lung bases with trace left pleural effusion (series 7, image 89). Upper Abdomen: No acute abnormality. Musculoskeletal: No chest wall abnormality. No acute osseous findings. Review of the MIP images confirms the above findings. IMPRESSION: 1. Examination for pulmonary embolism is substantially limited by breath motion artifact. Within this limitation, positive examination, with  segmental to subsegmental embolus present in the bilateral lower lobes and left upper lobe. 2. Mild global cardiomegaly.  RV LV ratio is borderline at 0.96. 3. Heterogeneous and ground-glass airspace opacity in the bilateral lung bases with trace left pleural effusion, generally in keeping with clinical concern for acute chest in the setting of sickle cell disease. These results were called by telephone at the time of interpretation on 04/14/2022 at 9:28 am to Dr. ALacretia Leigh, who verbally acknowledged these results. Electronically Signed   By: ADelanna AhmadiM.D.   On: 04/14/2022 09:30   DG Chest 2 View  Result Date: 04/14/2022 CLINICAL DATA:  Sickle cell crisis.  Dyspnea. EXAM: CHEST - 2 VIEW COMPARISON:  08/06/2021 FINDINGS: Streaky opacities are seen in both lung bases, new in the interval. No pulmonary edema or substantial pleural effusion. The cardio pericardial silhouette is enlarged. Right Port-A-Cath again noted. Telemetry leads overlie the chest. IMPRESSION: New streaky opacities in both lung bases. In the appropriate clinical setting, findings could be compatible with acute chest syndrome in this patient with a history of sickle cell disease. Electronically Signed  By: Misty Stanley M.D.   On: 04/14/2022 07:37    Medications: Scheduled Meds:  sodium chloride   Intravenous Once   Chlorhexidine Gluconate Cloth  6 each Topical Daily   folic acid  1 mg Oral Daily   gabapentin  300 mg Oral TID   HYDROmorphone   Intravenous Q4H   hydroxyurea  1,500 mg Oral Daily   influenza vac split quadrivalent PF  0.5 mL Intramuscular Tomorrow-1000   QUEtiapine  25 mg Oral QHS   senna-docusate  1 tablet Oral BID   sodium chloride flush  10-40 mL Intracatheter Q12H   Continuous Infusions:  sodium chloride 50 mL/hr at 04/15/22 0320   azithromycin 500 mg (04/15/22 1206)   cefTRIAXone (ROCEPHIN)  IV 1 g (04/15/22 1104)   heparin 1,650 Units/hr (04/15/22 1708)   PRN Meds:.acetaminophen, diphenhydrAMINE,  naloxone **AND** sodium chloride flush, ondansetron (ZOFRAN) IV, polyethylene glycol, sodium chloride flush, tiZANidine  Consultants: Pharmacy  Procedures: Head CT  Antibiotics: IV azithromycin IV Rocephin  Assessment/Plan: Principal Problem:   Sickle cell pain crisis ([place]) Active Problems:   Bipolar and related disorder ([place])   Chronic pain   Leukocytosis   Pulmonary embolism (HCC)  Sickle cell disease with pain crisis: Patient continues to have a significant amount of pain primarily to chest and upper back.  We will continue IV Dilaudid PCA without any changes today. Toradol 15 mg IV every 6 hours OxyContin 15 mg every 12 hours Monitor vital signs very closely, reevaluate pain scale regularly, and supplemental oxygen as needed  Pulmonary embolism: Continue heparin per pharmacy  Probable acute chest syndrome: Patient has oxygen requirement of 2 L.  We will continue IV antibiotics.  Patient is status post blood transfusion on yesterday.  She may benefit from simple exchange transfusion going forward.  We will monitor closely.  Anemia of chronic disease: Today, patient's hemoglobin is 7.4 g/dL.  She is status post 1 unit PRBCs.  We will continue to monitor closely.  Worsening headache: Patient has had worsening frontal headache over the past several days.  Headache has been positional.  CT shows no acute intracranial process.  We will continue pain management.  Chronic pain syndrome: Continue home medications  Bipolar depression: Today patient is very tearful.  She denies any suicidal intentions.  We will continue home medications.  Monitor closely.  Leukocytosis: WBCs elevated.  Secondary to vaso-occlusive crisis.  Continue to monitor closely.  Labs in AM.  Code Status: Full Code Family Communication: N/A Disposition Plan: Not yet ready for discharge  [place], MSN, FNP-C Patient [place], [place] [address] [phone]  If 7PM-7AM, please contact night-coverage.  04/15/2022, 7:13 PM  LOS: 1 day

## 2022-04-15 NOTE — Progress Notes (Signed)
ANTICOAGULATION CONSULT NOTE   Pharmacy Consult for heparin Indication: pulmonary embolus  Allergies  Allergen Reactions   Latex Rash   Wound Dressing Adhesive Rash    Patient Measurements: Height: '5\' 3"'$  (160 cm) Weight: 61 kg (134 lb 7.7 oz) IBW/kg (Calculated) : 52.4 Heparin Dosing Weight: 61  Vital Signs: Temp: 100.1 F (37.8 C) (09/13 1031) Temp Source: Oral (09/13 1031) BP: 132/76 (09/13 1031) Pulse Rate: 109 (09/13 1031)  Labs: Recent Labs    04/14/22 0738 04/14/22 1729 04/15/22 0046 04/15/22 0800  HGB 6.9*  --   --  7.4*  HCT 19.3*  --   --  20.7*  PLT 408*  --   --  352  HEPARINUNFRC  --  0.22* 0.25* 0.32  CREATININE 0.38*  --   --   --      Estimated Creatinine Clearance: 86.6 mL/min (A) (by C-G formula based on SCr of 0.38 mg/dL (L)).   Medical History: Past Medical History:  Diagnosis Date   Acute cystitis without hematuria 10/21/2019   Blood transfusion without reported diagnosis    Bronchitis    Chickenpox    Depression    Elevated ferritin level 05/2019   Heart murmur    Nausea without vomiting 09/09/2015   Pulmonary hypertension (HCC)    Sickle cell anemia (HCC)    Sickle cell disease, type SS (HCC)    Sickle cell pain crisis ([place]) 12/05/2016   Thrombocytosis 05/2019   Urinary tract infection    Vitamin D deficiency     Medications:  No anticoagulants PTA, on hydrea for sickle cell  Assessment: 26 yo F with left-sided pleuritic chest pain. PMH significant for sickle cell disease.  CTA: exam limited by breath motion artifact but segmental to subsegmental embolus present in the bilateral lower lobes and left upper lobe. Global cardiomegaly. RV LV ratio is borderline, 0.96. No pericardial effusion.  04/15/22 Heparin level now therapeutic after heparin rate increased from 1300 to 1450 units/hr No bleeding or complications of therapy noted  Goal of Therapy:  Heparin level 0.3-0.7 units/ml Monitor platelets by anticoagulation protocol:  Yes   Plan:  Continue IV heparin at current rate of 1450 units/hr Recheck heparin level in 6 hours to confirm Daily CBC & heparin level F/u for transition to [place] She has PAC & IV team consulted for PIV: if heparin run via PAC then her heparin levels will need to be drawn peripherally by lab.  Ideally want her heparin to run via PIV and heparin levels drawn by IV team via [place].   Adrian Saran, PharmD, BCPS Secure Chat if ?s 04/15/2022 10:38 AM

## 2022-04-15 NOTE — Progress Notes (Signed)
ANTICOAGULATION CONSULT NOTE   Pharmacy Consult for heparin Indication: pulmonary embolus  Allergies  Allergen Reactions   Latex Rash   Wound Dressing Adhesive Rash    Patient Measurements: Height: '5\' 3"'$  (160 cm) Weight: 61 kg (134 lb 7.7 oz) IBW/kg (Calculated) : 52.4 Heparin Dosing Weight: 61  Vital Signs: Temp: 99.4 F (37.4 C) (09/13 1432) Temp Source: Oral (09/13 1432) BP: 125/82 (09/13 1432) Pulse Rate: 137 (09/13 1432)  Labs: Recent Labs    04/14/22 0738 04/14/22 1729 04/15/22 0800 04/15/22 1400 04/15/22 1520  HGB 6.9*  --  7.4*  --   --   HCT 19.3*  --  20.7*  --   --   PLT 408*  --  352  --   --   HEPARINUNFRC  --    < > 0.32 <0.10* 0.12*  CREATININE 0.38*  --   --   --   --    < > = values in this interval not displayed.     Estimated Creatinine Clearance: 86.6 mL/min (A) (by C-G formula based on SCr of 0.38 mg/dL (L)).  Medications:  No anticoagulants PTA, on hydrea for sickle cell  Assessment: 26 yo F with left-sided pleuritic chest pain. PMH significant for sickle cell disease.  CTA: exam limited by breath motion artifact but segmental to subsegmental embolus present in the bilateral lower lobes and left upper lobe. Global cardiomegaly. RV LV ratio is borderline, 0.96. No pericardial effusion.  04/15/22  08 am Heparin level therapeutic after heparin rate increased from 1300 to 1450 units/hr Confirmatory heparin level drawn at 1400 - undetectable at < 0.10, heparin infusing via PIV & level drawn from [place] redrew heparin level via PAC at 1520 - level 0.12 is below goal Hg 7.4 after 1 U PRBC given 9/13 Per RN heparin infusing well w/ no issues No bleeding reported No bleeding or complications of therapy noted  Goal of Therapy:  Heparin level 0.3-0.7 units/ml Monitor platelets by anticoagulation protocol: Yes   Plan:  Bolus heparin 2000 units and increase heparin to 1650 units/hr Recheck heparin level in 6 hours  Daily CBC & heparin  level F/u for transition to [place], Pharm.D 04/15/2022 4:26 PM

## 2022-04-15 NOTE — Plan of Care (Signed)
Pt reporting pain 10/10 at this time.  Encouraged to use PCA.  Problem: Sensory: Goal: Pain level will decrease with appropriate interventions Outcome: Not Progressing   Problem: Activity: Goal: Risk for activity intolerance will decrease Outcome: Not Progressing   Problem: Nutrition: Goal: Adequate nutrition will be maintained Outcome: Not Progressing   Problem: Pain Managment: Goal: General experience of comfort will improve Outcome: Not Progressing   Problem: Education: Goal: Knowledge of vaso-occlusive preventative measures will improve Outcome: Progressing Goal: Awareness of infection prevention will improve Outcome: Progressing Goal: Awareness of signs and symptoms of anemia will improve Outcome: Progressing Goal: Long-term complications will improve Outcome: Progressing   Problem: Self-Care: Goal: Ability to incorporate actions that prevent/reduce pain crisis will improve Outcome: Progressing   Problem: Bowel/Gastric: Goal: Gut motility will be maintained Outcome: Progressing   Problem: Tissue Perfusion: Goal: Complications related to inadequate tissue perfusion will be avoided or minimized Outcome: Progressing   Problem: Respiratory: Goal: Pulmonary complications will be avoided or minimized Outcome: Progressing Goal: Acute Chest Syndrome will be identified early to prevent complications Outcome: Progressing   Problem: Fluid Volume: Goal: Ability to maintain a balanced intake and output will improve Outcome: Progressing   Problem: Health Behavior: Goal: Postive changes in compliance with treatment and prescription regimens will improve Outcome: Progressing   Problem: Education: Goal: Knowledge of General Education information will improve Description: Including pain rating scale, medication(s)/side effects and non-pharmacologic comfort measures Outcome: Progressing   Problem: Health Behavior/Discharge Planning: Goal: Ability to manage health-related  needs will improve Outcome: Progressing   Problem: Clinical Measurements: Goal: Ability to maintain clinical measurements within normal limits will improve Outcome: Progressing Goal: Will remain free from infection Outcome: Progressing Goal: Diagnostic test results will improve Outcome: Progressing Goal: Respiratory complications will improve Outcome: Progressing Goal: Cardiovascular complication will be avoided Outcome: Progressing   Problem: Coping: Goal: Level of anxiety will decrease Outcome: Progressing   Problem: Elimination: Goal: Will not experience complications related to bowel motility Outcome: Progressing Goal: Will not experience complications related to urinary retention Outcome: Progressing   Problem: Safety: Goal: Ability to remain free from injury will improve Outcome: Progressing   Problem: Skin Integrity: Goal: Risk for impaired skin integrity will decrease Outcome: Progressing

## 2022-04-15 NOTE — TOC Initial Note (Signed)
Transition of Care [facility]) - Initial/Assessment Note    Patient Details  Name: Sandra Mckay MRN: [id number] Date of Birth: 1996-07-13  Transition of Care [facility]) CM/SW Contact:    Leeroy Cha, RN Phone Number: 04/15/2022, 9:24 AM  Clinical Narrative:                  Transition of Care [facility]) Screening Note   Patient Details  Name: Sandra Mckay Date of Birth: [birth year]   Transition of Care [facility]) CM/SW Contact:    Leeroy Cha, RN Phone Number: 04/15/2022, 9:24 AM    Transition of Care Department (TOC) has reviewed patient and no TOC needs have been identified at this time. We will continue to monitor patient advancement through interdisciplinary progression rounds. If new patient transition needs arise, please place a TOC consult.      Barriers to Discharge: Continued Medical Work up   Patient Goals and CMS Choice Patient states their goals for this hospitalization and ongoing recovery are:: not stated      Expected Discharge Plan and Services     Discharge Planning Services: CM Consult   Living arrangements for the past 2 months: Single Family Home                                      Prior Living Arrangements/Services Living arrangements for the past 2 months: Single Family Home Lives with:: Self Patient language and need for interpreter reviewed:: Yes Do you feel safe going back to the place where you live?: Yes            Criminal Activity/Legal Involvement Pertinent to Current Situation/Hospitalization: No - Comment as needed  Activities of Daily Living Home Assistive Devices/Equipment: None ADL Screening (condition at time of admission) Patient's cognitive ability adequate to safely complete daily activities?: Yes Is the patient deaf or have difficulty hearing?: No Does the patient have difficulty seeing, even when wearing glasses/contacts?: No Does the patient have difficulty concentrating, remembering, or making  decisions?: No Patient able to express need for assistance with ADLs?: Yes Does the patient have difficulty dressing or bathing?: Yes Independently performs ADLs?: Yes (appropriate for developmental age) Communication: Independent Dressing (OT): Independent Grooming: Independent Feeding: Independent Bathing: Independent Is this a change from baseline?: Pre-admission baseline Toileting: Independent Is this a change from baseline?: Pre-admission baseline In/Out Bed: Independent Is this a change from baseline?: Pre-admission baseline Walks in Home: Independent Is this a change from baseline?: Pre-admission baseline Does the patient have difficulty walking or climbing stairs?: No Weakness of Legs: None Weakness of Arms/Hands: None  Permission Sought/Granted                  Emotional Assessment Appearance:: Appears stated age Attitude/Demeanor/Rapport: Engaged Affect (typically observed): Calm Orientation: : Oriented to Self, Oriented to Place, Oriented to  Time, Oriented to Situation Alcohol / Substance Use: Alcohol Use, Tobacco Use Psych Involvement: No (comment)  Admission diagnosis:  Sickle cell pain crisis ([place]) [D57.00] Acute chest syndrome ([place]) [D57.01] Patient Active Problem List   Diagnosis Date Noted   Pulmonary embolism ([place]) 04/14/2022   Sickle cell pain crisis ([place]) 03/26/2022   Hypokalemia 10/29/2021   Lumbar vertebral fracture ([place]) 06/16/2021   Chronic anemia 06/10/2021   Influenza A 06/10/2021   Cocaine use 10/15/2020   S/P cesarean section 01/09/2019   Yeast vaginitis 03/54/6568   Umbilical vein abnormality affecting pregnancy 12/29/2018  Medication management 12/12/2018   Anxiety and depression 07/15/2018   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 07/15/2018   Supervision of high risk pregnancy in third trimester 07/15/2018   Hyperbilirubinemia 12/25/2017   Leukocytosis 12/25/2017   Menorrhagia 12/25/2017   Thrombocytosis 12/25/2017    Tobacco abuse 12/25/2017   Bipolar and related disorder ([place]) 04/04/2017   Vasovagal syncope 09/18/2016   Closed fracture of lumbar vertebra without spinal cord injury ([place]) 07/15/2016   Sickle cell anemia ([place]) 06/08/2015   Adnexal mass 05/29/2014   Vitamin D deficiency 12/22/2011   Chronic pain 12/21/2011   Patent foramen ovale 12/21/2011   Heart murmur 10/07/2011   Pulmonary hypertension ([place]) 10/07/2011   PCP:  Dorena Dew, FNP Pharmacy:   [facility], [address] [employer] [address] [place] [address] Phone: [phone] Fax: [phone]  [place], [place] [address] [place] [address] [place] [address] Phone: [phone] Fax: [phone]  [id number] [place] [address] [place]: [phone]: [phone]    Social Determinants of Health (SDOH) Interventions    Readmission Risk Interventions   No data to display

## 2022-04-16 ENCOUNTER — Other Ambulatory Visit: Payer: Self-pay | Admitting: Family Medicine

## 2022-04-16 ENCOUNTER — Other Ambulatory Visit (HOSPITAL_COMMUNITY): Payer: Self-pay

## 2022-04-16 ENCOUNTER — Ambulatory Visit: Payer: Self-pay | Admitting: Nurse Practitioner

## 2022-04-16 DIAGNOSIS — D5700 Hb-SS disease with crisis, unspecified: Secondary | ICD-10-CM | POA: Diagnosis not present

## 2022-04-16 LAB — CBC
HCT: 19.4 % — ABNORMAL LOW (ref 36.0–46.0)
Hemoglobin: 6.9 g/dL — CL (ref 12.0–15.0)
MCH: 33.8 pg (ref 26.0–34.0)
MCHC: 35.6 g/dL (ref 30.0–36.0)
MCV: 95.1 fL (ref 80.0–100.0)
Platelets: 355 10*3/uL (ref 150–400)
RBC: 2.04 MIL/uL — ABNORMAL LOW (ref 3.87–5.11)
RDW: 18.8 % — ABNORMAL HIGH (ref 11.5–15.5)
WBC: 35.2 10*3/uL — ABNORMAL HIGH (ref 4.0–10.5)
nRBC: 0.2 % (ref 0.0–0.2)

## 2022-04-16 LAB — HEPARIN LEVEL (UNFRACTIONATED): Heparin Unfractionated: 0.28 IU/mL — ABNORMAL LOW (ref 0.30–0.70)

## 2022-04-16 LAB — HEMOGLOBIN AND HEMATOCRIT, BLOOD
HCT: 22.2 % — ABNORMAL LOW (ref 36.0–46.0)
Hemoglobin: 7.9 g/dL — ABNORMAL LOW (ref 12.0–15.0)

## 2022-04-16 LAB — PREPARE RBC (CROSSMATCH)

## 2022-04-16 MED ORDER — AZITHROMYCIN 250 MG PO TABS
500.0000 mg | ORAL_TABLET | Freq: Every day | ORAL | Status: AC
  Administered 2022-04-16 – 2022-04-18 (×3): 500 mg via ORAL
  Filled 2022-04-16 (×3): qty 2

## 2022-04-16 MED ORDER — HEPARIN BOLUS VIA INFUSION
1000.0000 [IU] | Freq: Once | INTRAVENOUS | Status: AC
  Administered 2022-04-16: 1000 [IU] via INTRAVENOUS
  Filled 2022-04-16: qty 1000

## 2022-04-16 MED ORDER — SODIUM CHLORIDE 0.9% IV SOLUTION: Freq: Once | INTRAVENOUS | Status: AC

## 2022-04-16 MED ORDER — GUAIFENESIN-DM 100-10 MG/5ML PO SYRP: 5.0000 mL | ORAL_SOLUTION | ORAL | Status: DC | PRN

## 2022-04-16 MED ORDER — APIXABAN 5 MG PO TABS: 5.0000 mg | ORAL_TABLET | Freq: Two times a day (BID) | ORAL | Status: DC

## 2022-04-16 MED ORDER — APIXABAN 5 MG PO TABS
10.0000 mg | ORAL_TABLET | Freq: Two times a day (BID) | ORAL | Status: DC
  Administered 2022-04-16 – 2022-04-20 (×9): 10 mg via ORAL
  Filled 2022-04-16 (×9): qty 2

## 2022-04-16 NOTE — Progress Notes (Signed)
ANTICOAGULATION CONSULT NOTE   Pharmacy Consult for heparin Indication: pulmonary embolus  Allergies  Allergen Reactions   Latex Rash   Wound Dressing Adhesive Rash    Patient Measurements: Height: '5\' 3"'$  (160 cm) Weight: 61 kg (134 lb 7.7 oz) IBW/kg (Calculated) : 52.4 Heparin Dosing Weight: 61  Vital Signs: Temp: 101.1 F (38.4 C) (09/13 2145) Temp Source: Oral (09/13 2145) BP: 125/85 (09/13 2035) Pulse Rate: 167 (09/13 2014)  Labs: Recent Labs    04/14/22 0738 04/14/22 1729 04/15/22 0800 04/15/22 1400 04/15/22 1520 04/15/22 2323  HGB 6.9*  --  7.4*  --   --   --   HCT 19.3*  --  20.7*  --   --   --   PLT 408*  --  352  --   --   --   HEPARINUNFRC  --    < > 0.32 <0.10* 0.12* 0.26*  CREATININE 0.38*  --   --   --   --   --    < > = values in this interval not displayed.     Estimated Creatinine Clearance: 86.6 mL/min (A) (by C-G formula based on SCr of 0.38 mg/dL (L)).  Medications:  No anticoagulants PTA, on hydrea for sickle cell  Assessment: 26 yo F with left-sided pleuritic chest pain. PMH significant for sickle cell disease.  CTA: exam limited by breath motion artifact but segmental to subsegmental embolus present in the bilateral lower lobes and left upper lobe. Global cardiomegaly. RV LV ratio is borderline, 0.96. No pericardial effusion.  04/16/22  08 am Heparin level therapeutic after heparin rate increased from 1300 to 1450 units/hr Confirmatory heparin level drawn at 1400 - undetectable at < 0.10, heparin infusing via PIV & level drawn from [facility] RN redrew heparin level via PAC at 1520 - level 0.12 is below goal Hg 7.4 after 1 U PRBC given 9/13 Per RN heparin infusing well w/ no issues No bleeding reported No bleeding or complications of therapy noted  PM Update: Heparin level = 0.26 (increasing but still subtherapeutic) following heparin 2000 unit rebolus and rate increase to 1650 units/hr RN confirmed no interruption in therapy and no line  issues No complications of therapy noted  Goal of Therapy:  Heparin level 0.3-0.7 units/ml Monitor platelets by anticoagulation protocol: Yes   Plan:  Bolus heparin 1000 units and increase heparin to 1800 units/hr Recheck heparin level in 6 hours  Daily CBC & heparin level F/u for transition to [place], PharmD 04/16/2022 12:01 AM

## 2022-04-16 NOTE — Progress Notes (Signed)
I was informed in shift report that pt had been tachy throughout the day, 140s sustained with activity. Noted now that after pt got up to [facility], HR was sustained in 160s-170s, and maxed out at 197. Pt observed to be SOB with exertion and at rest, 10/10 pain, appearing exhausted. Checked VS and in addition to significant tachycardia, found axillary temp to be 102.8. Paged on call provider and rapid response to evaluate pt. Started cooling measures and prn tylenol given. Instructed pt to use bedpan instead of BSC for now as well. Rapid response arrived, see note for details. Following interventions, pts HR eventually decreased to 110s-120s and temp continued to decrease as well to 100.8. Rapid response and on call provider have checked in on pt's status throughout shift. Will continue to monitor closely. Hortencia Conradi RN

## 2022-04-16 NOTE — Progress Notes (Signed)
[facility] for heparin >> Eliquis Indication: pulmonary embolus  Allergies  Allergen Reactions   Latex Rash   Wound Dressing Adhesive Rash    Patient Measurements: Height: '5\' 3"'$  (160 cm) Weight: 61 kg (134 lb 7.7 oz) IBW/kg (Calculated) : 52.4 Heparin Dosing Weight: 61  Vital Signs: Temp: 99.7 F (37.6 C) (09/14 0657) Temp Source: Oral (09/14 0657) BP: 124/77 (09/14 0657) Pulse Rate: 115 (09/14 0657)  Labs: Recent Labs    04/14/22 0738 04/14/22 1729 04/15/22 0800 04/15/22 1400 04/15/22 1520 04/15/22 2323 04/16/22 0619  HGB 6.9*  --  7.4*  --   --   --  6.9*  HCT 19.3*  --  20.7*  --   --   --  19.4*  PLT 408*  --  352  --   --   --  355  HEPARINUNFRC  --    < > 0.32   < > 0.12* 0.26* 0.28*  CREATININE 0.38*  --   --   --   --   --   --    < > = values in this interval not displayed.     Estimated Creatinine Clearance: 86.6 mL/min (A) (by C-G formula based on SCr of 0.38 mg/dL (L)).  Medications:  No anticoagulants PTA, on hydrea for sickle cell  Assessment: 26 yo F with left-sided pleuritic chest pain. PMH significant for sickle cell disease.  CTA: exam limited by breath motion artifact but segmental to subsegmental embolus present in the bilateral lower lobes and left upper lobe. Global cardiomegaly. RV LV ratio is borderline, 0.96. No pericardial effusion.  04/16/22 AM heparin level slightly higher but remains subtherapeutic despite multiple rate increases to 1800 units/hr - now just under 30 units/kg/hr, indicating nontrivial degree of heparin resistance Hgb slightly lower again today; PRBC x 1 ordered; Plt stable WNL No bleeding or infusion complications per RN Per Sickle Cell service, OK to transition to Eliquis today given inability to achieve therapeutic heparin, and low concern for bleeding/invasive procedures.  Goal of Therapy:  Heparin level 0.3-0.7 units/ml Monitor platelets by anticoagulation protocol: Yes    Plan:  Eliquis 10 mg PO bid x 7 days, followed by 5 mg PO bid thereafter Stop heparin with first dose of Eliquis Pharmacy to provide Eliquis education prior to discharge  Reuel Boom, PharmD, BCPS [phone] 04/16/2022, 9:03 AM

## 2022-04-16 NOTE — Discharge Instructions (Signed)
Information on my medicine - ELIQUIS (apixaban)  Why was Eliquis prescribed for you? Eliquis was prescribed to treat blood clots that may have been found in the veins of your legs (deep vein thrombosis) or in your lungs (pulmonary embolism) and to reduce the risk of them occurring again.  What do You need to know about Eliquis ? The starting dose is 10 mg (two 5 mg tablets) taken TWICE daily for the FIRST SEVEN (7) DAYS, then on (enter date)  04/23/2022  the dose is reduced to ONE 5 mg tablet taken TWICE daily.  Eliquis may be taken with or without food.   Try to take the dose about the same time in the morning and in the evening. If you have difficulty swallowing the tablet whole please discuss with your pharmacist how to take the medication safely.  Take Eliquis exactly as prescribed and DO NOT stop taking Eliquis without talking to the doctor who prescribed the medication.  Stopping may increase your risk of developing a new blood clot.  Refill your prescription before you run out.  After discharge, you should have regular check-up appointments with your healthcare provider that is prescribing your Eliquis.    What do you do if you miss a dose? If a dose of ELIQUIS is not taken at the scheduled time, take it as soon as possible on the same day and twice-daily administration should be resumed. The dose should not be doubled to make up for a missed dose.  Important Safety Information A possible side effect of Eliquis is bleeding. You should call your healthcare provider right away if you experience any of the following: Bleeding from an injury or your nose that does not stop. Unusual colored urine (red or dark brown) or unusual colored stools (red or black). Unusual bruising for unknown reasons. A serious fall or if you hit your head (even if there is no bleeding).  Some medicines may interact with Eliquis and might increase your risk of bleeding or clotting while on Eliquis. To  help avoid this, consult your healthcare provider or pharmacist prior to using any new prescription or non-prescription medications, including herbals, vitamins, non-steroidal anti-inflammatory drugs (NSAIDs) and supplements.  This website has more information on Eliquis (apixaban): http://www.eliquis.com/eliquis/home

## 2022-04-16 NOTE — Progress Notes (Signed)
Subjective: Sandra Mckay is a 26 year old female with a medical history significant for sickle cell disease, chronic pain syndrome, opiate dependence and tolerance, bipolar depression, and history of anemia of chronic disease was admitted for pulmonary embolism in the setting of sickle cell pain crisis. Sandra Mckay continues to be in a significant amount of pain.  She rates her pain as 9810.  She states that it has been difficult to breathe.  Patient endorses shortness of breath and chest pain.  Also, increased pain to upper and lower back.  She characterizes pain as constant and sharp.   Today, patient's hemoglobin is 6.9 g/dL.  She is status post 1 unit PRBCs. She denies blurry vision, dizziness, urinary symptoms, nausea, vomiting, or diarrhea.  Patient continues to endorse headache, however CT of head is negative for any intracranial abnormalities.  Objective:  Vital signs in last 24 hours:  Vitals:   04/15/22 2328 04/16/22 0306 04/16/22 0431 04/16/22 0657  BP:  127/76  124/77  Pulse:  (!) 117  (!) 115  Resp: '17 18 20 18  '$ Temp:  (!) 100.8 F (38.2 C)  99.7 F (37.6 C)  TempSrc:  Oral  Oral  SpO2: 96% 97% 95% 100%  Weight:      Height:        Intake/Output from previous day:   Intake/Output Summary (Last 24 hours) at 04/16/2022 0925 Last data filed at 04/16/2022 0700 Gross per 24 hour  Intake 1480.69 ml  Output 2600 ml  Net -1119.31 ml     Physical Exam Constitutional:      Appearance: Normal appearance. She is ill-appearing.  Eyes:     Pupils: Pupils are equal, round, and reactive to light.  Cardiovascular:     Rate and Rhythm: Tachycardia present.     Heart sounds: No murmur heard. Pulmonary:     Effort: Respiratory distress present.     Breath sounds: No stridor. No rhonchi.  Chest:     Chest wall: No tenderness.  Abdominal:     General: Bowel sounds are normal.  Musculoskeletal:        General: Normal range of motion.  Skin:    General: Skin is warm.   Psychiatric:        Mood and Affect: Mood is depressed. Affect is tearful.        Behavior: Behavior normal.     Lab Results:  Basic Metabolic Panel:    Component Value Date/Time   NA 139 04/14/2022 0738   NA 138 01/14/2022 1136   K 4.2 04/14/2022 0738   CL 112 (H) 04/14/2022 0738   CO2 21 (L) 04/14/2022 0738   BUN 6 04/14/2022 0738   BUN 9 01/14/2022 1136   CREATININE 0.38 (L) 04/14/2022 0738   CREATININE 0.53 05/31/2017 1145   GLUCOSE 112 (H) 04/14/2022 0738   CALCIUM 9.5 04/14/2022 0738   CBC:    Component Value Date/Time   WBC 35.2 (H) 04/16/2022 0619   HGB 6.9 (LL) 04/16/2022 0619   HGB 9.5 (L) 01/14/2022 1136   HCT 19.4 (L) 04/16/2022 0619   HCT 26.9 (L) 01/14/2022 1136   PLT 355 04/16/2022 0619   PLT 452 (H) 01/14/2022 1136   MCV 95.1 04/16/2022 0619   MCV 101 (H) 01/14/2022 1136   NEUTROABS 9.4 (H) 04/14/2022 0738   NEUTROABS 6.1 01/14/2022 1136   LYMPHSABS 2.2 04/14/2022 0738   LYMPHSABS 1.9 01/14/2022 1136   MONOABS 2.6 (H) 04/14/2022 0738   EOSABS 0.0 04/14/2022 1478  EOSABS 0.1 01/14/2022 1136   BASOSABS 0.1 04/14/2022 0738   BASOSABS 0.1 01/14/2022 1136    No results found for this or any previous visit (from the past 240 hour(s)).  Studies/Results: CT HEAD WO CONTRAST (5MM)  Result Date: 04/15/2022 CLINICAL DATA:  Headache EXAM: CT HEAD WITHOUT CONTRAST TECHNIQUE: Contiguous axial images were obtained from the base of the skull through the vertex without intravenous contrast. RADIATION DOSE REDUCTION: This exam was performed according to the departmental dose-optimization program which includes automated exposure control, adjustment of the mA and/or kV according to patient size and/or use of iterative reconstruction technique. COMPARISON:  None Available. FINDINGS: Brain: No evidence of acute infarction, hemorrhage, hydrocephalus, extra-axial collection or mass lesion/mass effect. Image quality degraded by motion Vascular: Negative for hyperdense  vessel Skull: Negative Sinuses/Orbits: Paranasal sinuses clear.  Normal orbit Other: None IMPRESSION: Negative CT head Motion degraded study. Electronically Signed   By: Franchot Gallo M.D.   On: 04/15/2022 11:43    Medications: Scheduled Meds:  sodium chloride   Intravenous Once   sodium chloride   Intravenous Once   apixaban  10 mg Oral BID   Followed by   Derrill Memo ON 04/23/2022] apixaban  5 mg Oral BID   Chlorhexidine Gluconate Cloth  6 each Topical Daily   folic acid  1 mg Oral Daily   gabapentin  300 mg Oral TID   HYDROmorphone   Intravenous Q4H   hydroxyurea  1,500 mg Oral Daily   influenza vac split quadrivalent PF  0.5 mL Intramuscular Tomorrow-1000   QUEtiapine  25 mg Oral QHS   senna-docusate  1 tablet Oral BID   sodium chloride flush  10-40 mL Intracatheter Q12H   Continuous Infusions:  sodium chloride 50 mL/hr at 04/15/22 2000   azithromycin 500 mg (04/15/22 1206)   cefTRIAXone (ROCEPHIN)  IV 1 g (04/15/22 1104)   PRN Meds:.acetaminophen, diphenhydrAMINE, guaiFENesin-dextromethorphan, naloxone **AND** sodium chloride flush, ondansetron (ZOFRAN) IV, polyethylene glycol, sodium chloride flush, tiZANidine  Consultants: Pharmacy  Procedures: Head CT  Antibiotics: IV azithromycin IV Rocephin  Assessment/Plan: Principal Problem:   Sickle cell pain crisis ([place]) Active Problems:   Bipolar and related disorder ([place])   Chronic pain   Leukocytosis   Pulmonary embolism ([facility])  Sickle cell disease with pain crisis: Patient continues to have a significant amount of pain primarily to chest and upper back.  We will continue IV Dilaudid PCA without any changes today. Toradol 15 mg IV every 6 hours OxyContin 15 mg every 12 hours Monitor vital signs very closely, reevaluate pain scale regularly, and supplemental oxygen as needed  Pulmonary embolism: Continue heparin per pharmacy  Probable acute chest syndrome: Patient has oxygen requirement of 2 L.  We will continue IV  antibiotics.  Patient is status post blood transfusion on yesterday.  She may benefit from simple exchange transfusion going forward.  We will monitor closely.  Anemia of chronic disease: Today, patient's hemoglobin is 6.9 g/dL.  Transfuse 2 units PRBCs.  Follow labs in a.m.  Patient may benefit from simple exchange transfusion.  Will review LDH and hepatic function panel in AM.  Worsening headache: Headache improving.  We will continue pain management.  Chronic pain syndrome: Continue home medications  Bipolar depression: Stable.  She denies any suicidal intentions.  We will continue home medications.  Monitor closely.  Leukocytosis: WBCs elevated.  Multifactorial.  Continue IV antibiotics.  Continue to monitor closely.  Labs in AM.  Code Status: Full Code Family Communication: Patient's mother is  at bedside.  All questions addressed. Disposition Plan: Not yet ready for discharge  [place], MSN, FNP-C Patient [place]  [place], [place] [address] [phone]  If 7PM-7AM, please contact night-coverage.  04/16/2022, 9:25 AM  LOS: 2 days

## 2022-04-17 DIAGNOSIS — D5700 Hb-SS disease with crisis, unspecified: Secondary | ICD-10-CM | POA: Diagnosis not present

## 2022-04-17 LAB — CBC
HCT: 20 % — ABNORMAL LOW (ref 36.0–46.0)
Hemoglobin: 7 g/dL — ABNORMAL LOW (ref 12.0–15.0)
MCH: 32.3 pg (ref 26.0–34.0)
MCHC: 35 g/dL (ref 30.0–36.0)
MCV: 92.2 fL (ref 80.0–100.0)
Platelets: 349 10*3/uL (ref 150–400)
RBC: 2.17 MIL/uL — ABNORMAL LOW (ref 3.87–5.11)
RDW: 18.1 % — ABNORMAL HIGH (ref 11.5–15.5)
WBC: 21.1 10*3/uL — ABNORMAL HIGH (ref 4.0–10.5)
nRBC: 0.2 % (ref 0.0–0.2)

## 2022-04-17 LAB — BPAM RBC
Blood Product Expiration Date: 202310102359
Blood Product Expiration Date: 202310122359
ISSUE DATE / TIME: 202309130217
ISSUE DATE / TIME: 202309141139
Unit Type and Rh: 5100
Unit Type and Rh: 5100

## 2022-04-17 LAB — LACTATE DEHYDROGENASE: LDH: 257 U/L — ABNORMAL HIGH (ref 98–192)

## 2022-04-17 LAB — COMPREHENSIVE METABOLIC PANEL
ALT: 15 U/L (ref 0–44)
AST: 17 U/L (ref 15–41)
Albumin: 3.4 g/dL — ABNORMAL LOW (ref 3.5–5.0)
Alkaline Phosphatase: 68 U/L (ref 38–126)
Anion gap: 6 (ref 5–15)
BUN: 5 mg/dL — ABNORMAL LOW (ref 6–20)
CO2: 27 mmol/L (ref 22–32)
Calcium: 8.7 mg/dL — ABNORMAL LOW (ref 8.9–10.3)
Chloride: 102 mmol/L (ref 98–111)
Creatinine, Ser: <0.3 mg/dL — ABNORMAL LOW (ref 0.44–1.00)
Glucose, Bld: 99 mg/dL (ref 70–99)
Potassium: 3.1 mmol/L — ABNORMAL LOW (ref 3.5–5.1)
Sodium: 135 mmol/L (ref 135–145)
Total Bilirubin: 3.1 mg/dL — ABNORMAL HIGH (ref 0.3–1.2)
Total Protein: 6.3 g/dL — ABNORMAL LOW (ref 6.5–8.1)

## 2022-04-17 LAB — TYPE AND SCREEN
ABO/RH(D): A POS
Antibody Screen: NEGATIVE
Donor AG Type: NEGATIVE
Unit division: 0
Unit division: 0

## 2022-04-17 MED ORDER — POTASSIUM CHLORIDE CRYS ER 20 MEQ PO TBCR
40.0000 meq | EXTENDED_RELEASE_TABLET | Freq: Every day | ORAL | Status: AC
  Administered 2022-04-17 – 2022-04-19 (×3): 40 meq via ORAL
  Filled 2022-04-17 (×3): qty 2

## 2022-04-17 NOTE — Plan of Care (Signed)
Pt reporting pain 7/10.  Encouraged PCA use.  Problem: Education: Goal: Knowledge of vaso-occlusive preventative measures will improve Outcome: Progressing Goal: Awareness of infection prevention will improve Outcome: Progressing Goal: Awareness of signs and symptoms of anemia will improve Outcome: Progressing Goal: Long-term complications will improve Outcome: Progressing   Problem: Self-Care: Goal: Ability to incorporate actions that prevent/reduce pain crisis will improve Outcome: Progressing   Problem: Bowel/Gastric: Goal: Gut motility will be maintained Outcome: Progressing   Problem: Tissue Perfusion: Goal: Complications related to inadequate tissue perfusion will be avoided or minimized Outcome: Progressing   Problem: Respiratory: Goal: Pulmonary complications will be avoided or minimized Outcome: Progressing Goal: Acute Chest Syndrome will be identified early to prevent complications Outcome: Progressing   Problem: Fluid Volume: Goal: Ability to maintain a balanced intake and output will improve Outcome: Progressing   Problem: Sensory: Goal: Pain level will decrease with appropriate interventions Outcome: Progressing   Problem: Health Behavior: Goal: Postive changes in compliance with treatment and prescription regimens will improve Outcome: Progressing   Problem: Education: Goal: Knowledge of General Education information will improve Description: Including pain rating scale, medication(s)/side effects and non-pharmacologic comfort measures Outcome: Progressing   Problem: Health Behavior/Discharge Planning: Goal: Ability to manage health-related needs will improve Outcome: Progressing   Problem: Clinical Measurements: Goal: Ability to maintain clinical measurements within normal limits will improve Outcome: Progressing Goal: Will remain free from infection Outcome: Progressing Goal: Diagnostic test results will improve Outcome: Progressing Goal:  Respiratory complications will improve Outcome: Progressing Goal: Cardiovascular complication will be avoided Outcome: Progressing   Problem: Activity: Goal: Risk for activity intolerance will decrease Outcome: Progressing   Problem: Nutrition: Goal: Adequate nutrition will be maintained Outcome: Progressing   Problem: Coping: Goal: Level of anxiety will decrease Outcome: Progressing   Problem: Elimination: Goal: Will not experience complications related to bowel motility Outcome: Progressing Goal: Will not experience complications related to urinary retention Outcome: Progressing   Problem: Pain Managment: Goal: General experience of comfort will improve Outcome: Progressing   Problem: Safety: Goal: Ability to remain free from injury will improve Outcome: Progressing   Problem: Skin Integrity: Goal: Risk for impaired skin integrity will decrease Outcome: Progressing

## 2022-04-17 NOTE — Progress Notes (Signed)
Subjective: Sandra Mckay is a 26 year old female with a medical history significant for sickle cell disease, chronic pain syndrome, opiate dependence and tolerance, bipolar depression, and history of anemia of chronic disease was admitted for pulmonary embolism in the setting of sickle cell pain crisis. James continues to be in a significant amount of pain.  She rates her pain as 7/10.   Also, increased pain to upper and lower back.  She characterizes pain as constant and sharp.   Today, patient's hemoglobin is 6.9 g/dL.  She is status post 1 unit PRBCs. She denies blurry vision, dizziness, urinary symptoms, nausea, vomiting, or diarrhea.  Patient continues to endorse headache, however CT of head is negative for any intracranial abnormalities.  Objective:  Vital signs in last 24 hours:  Vitals:   04/17/22 0345 04/17/22 0610 04/17/22 0832 04/17/22 0952  BP:  115/68    Pulse:  (!) 105    Resp: 15 18 (!) 9 12  Temp:  100.2 F (37.9 C)    TempSrc:  Oral    SpO2: 95% 99% 99% 91%  Weight:      Height:        Intake/Output from previous day:   Intake/Output Summary (Last 24 hours) at 04/17/2022 1137 Last data filed at 04/17/2022 1000 Gross per 24 hour  Intake 1218.4 ml  Output 800 ml  Net 418.4 ml     Physical Exam Constitutional:      Appearance: Normal appearance.  Eyes:     Pupils: Pupils are equal, round, and reactive to light.  Cardiovascular:     Rate and Rhythm: Tachycardia present.     Heart sounds: No murmur heard. Pulmonary:     Breath sounds: No stridor. No rhonchi.  Chest:     Chest wall: No tenderness.  Abdominal:     General: Bowel sounds are normal.  Musculoskeletal:        General: Normal range of motion.  Skin:    General: Skin is warm.  Psychiatric:        Mood and Affect: Mood is depressed. Affect is tearful.        Behavior: Behavior normal.     Lab Results:  Basic Metabolic Panel:    Component Value Date/Time   NA 139 04/14/2022 0738    NA 138 01/14/2022 1136   K 4.2 04/14/2022 0738   CL 112 (H) 04/14/2022 0738   CO2 21 (L) 04/14/2022 0738   BUN 6 04/14/2022 0738   BUN 9 01/14/2022 1136   CREATININE 0.38 (L) 04/14/2022 0738   CREATININE 0.53 05/31/2017 1145   GLUCOSE 112 (H) 04/14/2022 0738   CALCIUM 9.5 04/14/2022 0738   CBC:    Component Value Date/Time   WBC 21.1 (H) 04/17/2022 0625   HGB 7.0 (L) 04/17/2022 0625   HGB 9.5 (L) 01/14/2022 1136   HCT 20.0 (L) 04/17/2022 0625   HCT 26.9 (L) 01/14/2022 1136   PLT 349 04/17/2022 0625   PLT 452 (H) 01/14/2022 1136   MCV 92.2 04/17/2022 0625   MCV 101 (H) 01/14/2022 1136   NEUTROABS 9.4 (H) 04/14/2022 0738   NEUTROABS 6.1 01/14/2022 1136   LYMPHSABS 2.2 04/14/2022 0738   LYMPHSABS 1.9 01/14/2022 1136   MONOABS 2.6 (H) 04/14/2022 0738   EOSABS 0.0 04/14/2022 0738   EOSABS 0.1 01/14/2022 1136   BASOSABS 0.1 04/14/2022 0738   BASOSABS 0.1 01/14/2022 1136    No results found for this or any previous visit (from the past 240  hour(s)).  Studies/Results: No results found.  Medications: Scheduled Meds:  sodium chloride   Intravenous Once   apixaban  10 mg Oral BID   Followed by   Derrill Memo ON 04/23/2022] apixaban  5 mg Oral BID   azithromycin  500 mg Oral Daily   Chlorhexidine Gluconate Cloth  6 each Topical Daily   folic acid  1 mg Oral Daily   gabapentin  300 mg Oral TID   HYDROmorphone   Intravenous Q4H   hydroxyurea  1,500 mg Oral Daily   influenza vac split quadrivalent PF  0.5 mL Intramuscular Tomorrow-1000   QUEtiapine  25 mg Oral QHS   senna-docusate  1 tablet Oral BID   sodium chloride flush  10-40 mL Intracatheter Q12H   Continuous Infusions:  sodium chloride 10 mL/hr at 04/17/22 0954   cefTRIAXone (ROCEPHIN)  IV 1 g (04/16/22 1442)   PRN Meds:.acetaminophen, diphenhydrAMINE, guaiFENesin-dextromethorphan, naloxone **AND** sodium chloride flush, ondansetron (ZOFRAN) IV, polyethylene glycol, sodium chloride flush,  tiZANidine  Consultants: Pharmacy  Procedures: None  Antibiotics: Azithromycin IV Rocephin  Assessment/Plan: Principal Problem:   Sickle cell pain crisis ([place]) Active Problems:   Bipolar and related disorder (HCC)   Chronic pain   Leukocytosis   Pulmonary embolism (HCC)  Sickle cell disease with pain crisis: Patient continues to have a significant amount of pain primarily to chest and upper back.  We will continue IV Dilaudid PCA without any changes today. Toradol 15 mg IV every 6 hours OxyContin 15 mg every 12 hours Monitor vital signs very closely, reevaluate pain scale regularly, and supplemental oxygen as needed  Pulmonary embolism: Continue Eliquis per pharmacy  Probable acute chest syndrome: Patient has oxygen requirement of 2 L.  We will continue IV antibiotics.  Patient is status post blood transfusion on yesterday.  She may benefit from simple exchange transfusion going forward.  We will monitor closely.  Anemia of chronic disease: Today, patient's hemoglobin is 7.1 g/dL.  Patient is status post 3 units PRBCs follow labs in a.m.  Patient may benefit from simple exchange transfusion.  Will review LDH and hepatic function panel in AM.  Worsening headache: Headache improving.  We will continue pain management.  Chronic pain syndrome: Continue home medications  Bipolar depression: Stable.  She denies any suicidal intentions.  We will continue home medications.  Monitor closely.  Leukocytosis: WBCs improving.  Multifactorial.  Continue IV antibiotics.  Continue to monitor closely.  Labs in AM.  Code Status: Full Code Family Communication: Patient's mother is at bedside.  All questions addressed. Disposition Plan: Not yet ready for discharge  Nicholas, MSN, FNP-C [facility]  [place], [place] [address] [phone]  If 7PM-7AM, please contact night-coverage.  04/17/2022, 11:37 AM  LOS: 3  days

## 2022-04-18 NOTE — Progress Notes (Signed)
Patient ID: Sandra Mckay, female   DOB: 01/18/1996, 26 y.o.   MRN: [id number] Subjective: Sandra Mckay is a 26 year old female with a medical history significant for sickle cell disease, chronic pain syndrome, opiate dependence and tolerance, bipolar depression, and history of anemia of chronic disease was admitted for pulmonary embolism in the setting of sickle cell pain crisis.  Patient feels slightly better today, still a little bit short of breath especially on ambulation, cough is less.  Pain is improved.  Hemoglobin is stable.  She denies any fever, chest pain, nausea, vomiting or diarrhea.  Headache is slightly improved as well.  Of note, her head CT was negative for any acute intracranial process. She denies any urinary symptoms. Objective:  Vital signs in last 24 hours:  Vitals:   04/18/22 1125 04/18/22 1228 04/18/22 1453 04/18/22 1513  BP: 111/71  105/77   Pulse: (!) 105  (!) 107   Resp: '16 10 16 10  '$ Temp: 99.3 F (37.4 C)  99.6 F (37.6 C)   TempSrc: Oral  Oral   SpO2: 94% 99% 97% 97%  Weight:      Height:        Intake/Output from previous day:   Intake/Output Summary (Last 24 hours) at 04/18/2022 1545 Last data filed at 04/18/2022 1452 Gross per 24 hour  Intake 265.88 ml  Output 1800 ml  Net -1534.12 ml    Physical Exam: General: Alert, awake, oriented x3, in no acute distress but anxious looking.  HEENT: [place]/AT [place], EOMI Neck: Trachea midline,  no masses, no thyromegal,y no JVD, no carotid bruit OROPHARYNX:  Moist, No exudate/ erythema/lesions.  Heart: Tachycardia. Regular rate and rhythm, without murmurs, rubs, gallops, PMI non-displaced, no heaves or thrills on palpation.  Lungs: Clear to auscultation, no wheezing or rhonchi noted. No increased vocal fremitus resonant to percussion  Abdomen: Soft, nontender, nondistended, positive bowel sounds, no masses no hepatosplenomegaly noted..  Neuro: No focal neurological deficits noted cranial nerves II through  XII grossly intact. DTRs 2+ bilaterally upper and lower extremities. Strength 5 out of 5 in bilateral upper and lower extremities. Musculoskeletal: No warm swelling or erythema around joints, no spinal tenderness noted. Psychiatric: Patient alert and oriented x3, good insight and cognition, good recent to remote recall. Anxious looking. Lymph node survey: No cervical axillary or inguinal lymphadenopathy noted.  Lab Results:  Basic Metabolic Panel:    Component Value Date/Time   NA 135 04/17/2022 1110   NA 138 01/14/2022 1136   K 3.1 (L) 04/17/2022 1110   CL 102 04/17/2022 1110   CO2 27 04/17/2022 1110   BUN 5 (L) 04/17/2022 1110   BUN 9 01/14/2022 1136   CREATININE <0.30 (L) 04/17/2022 1110   CREATININE 0.53 05/31/2017 1145   GLUCOSE 99 04/17/2022 1110   CALCIUM 8.7 (L) 04/17/2022 1110   CBC:    Component Value Date/Time   WBC 21.1 (H) 04/17/2022 0625   HGB 7.0 (L) 04/17/2022 0625   HGB 9.5 (L) 01/14/2022 1136   HCT 20.0 (L) 04/17/2022 0625   HCT 26.9 (L) 01/14/2022 1136   PLT 349 04/17/2022 0625   PLT 452 (H) 01/14/2022 1136   MCV 92.2 04/17/2022 0625   MCV 101 (H) 01/14/2022 1136   NEUTROABS 9.4 (H) 04/14/2022 0738   NEUTROABS 6.1 01/14/2022 1136   LYMPHSABS 2.2 04/14/2022 0738   LYMPHSABS 1.9 01/14/2022 1136   MONOABS 2.6 (H) 04/14/2022 0738   EOSABS 0.0 04/14/2022 0738   EOSABS 0.1 01/14/2022 1136  BASOSABS 0.1 04/14/2022 0738   BASOSABS 0.1 01/14/2022 1136    No results found for this or any previous visit (from the past 240 hour(s)).  Studies/Results: No results found.  Medications: Scheduled Meds:  apixaban  10 mg Oral BID   Followed by   Derrill Memo ON 04/23/2022] apixaban  5 mg Oral BID   Chlorhexidine Gluconate Cloth  6 each Topical Daily   folic acid  1 mg Oral Daily   gabapentin  300 mg Oral TID   HYDROmorphone   Intravenous Q4H   hydroxyurea  1,500 mg Oral Daily   influenza vac split quadrivalent PF  0.5 mL Intramuscular Tomorrow-1000   potassium  chloride  40 mEq Oral Daily   QUEtiapine  25 mg Oral QHS   senna-docusate  1 tablet Oral BID   sodium chloride flush  10-40 mL Intracatheter Q12H   Continuous Infusions:  cefTRIAXone (ROCEPHIN)  IV 1 g (04/18/22 1513)   PRN Meds:.acetaminophen, diphenhydrAMINE, guaiFENesin-dextromethorphan, naloxone **AND** sodium chloride flush, ondansetron (ZOFRAN) IV, polyethylene glycol, sodium chloride flush, tiZANidine  Consultants: None  Procedures: None  Antibiotics: None  Assessment/Plan: Principal Problem:   Sickle cell pain crisis ([place]) Active Problems:   Bipolar and related disorder ([place])   Chronic pain   Leukocytosis   Pulmonary embolism (HCC)  Hb Sickle Cell Disease with Pain crisis: Continue [facility] at [facility], will continue weight based Dilaudid PCA at current setting, continue IV Toradol 15 mg Q 6 H for total of 5 days, continue oral home pain medications as ordered monitor vitals very closely, Re-evaluate pain scale regularly, 2 L of Oxygen by [place]. Pulmonary embolism: Patient is now on COAG. Continue Eliquis per pharmacy dosing. Acute chest syndrome: Possibly from acute pulmonary embolism, patient still requiring oxygen supplementation.  Patient is on antibiotics, will continue for now.  If oxygenation does not improve to the level of baseline, will consider exchange transfusion.  Continue to monitor very closely. Anemia of Chronic Disease: Patient is s/p transfusion of 3 units of PRBCs.  Hemoglobin is holding up around 7 g per DL.  We will continue to monitor closely, check labs in AM. Chronic pain Syndrome: Continue home pain medications as ordered. Bipolar depression: Stable. Patient denies any suicidal ideations or thoughts. We will continue her home medications.  Monitor closely.  Code Status: Full Code Family Communication: N/A Disposition Plan: Not yet ready for discharge  Joycelyn Liska  If 7PM-7AM, please contact night-coverage.  04/18/2022, 3:45 PM  LOS: 4 days

## 2022-04-18 NOTE — Plan of Care (Signed)

## 2022-04-19 LAB — CBC WITH DIFFERENTIAL/PLATELET
Abs Immature Granulocytes: 0.05 10*3/uL (ref 0.00–0.07)
Basophils Absolute: 0.1 10*3/uL (ref 0.0–0.1)
Basophils Relative: 1 %
Eosinophils Absolute: 0.1 10*3/uL (ref 0.0–0.5)
Eosinophils Relative: 1 %
HCT: 17.7 % — ABNORMAL LOW (ref 36.0–46.0)
Hemoglobin: 6.1 g/dL — CL (ref 12.0–15.0)
Immature Granulocytes: 1 %
Lymphocytes Relative: 21 %
Lymphs Abs: 2.2 10*3/uL (ref 0.7–4.0)
MCH: 31.9 pg (ref 26.0–34.0)
MCHC: 34.5 g/dL (ref 30.0–36.0)
MCV: 92.7 fL (ref 80.0–100.0)
Monocytes Absolute: 1.8 10*3/uL — ABNORMAL HIGH (ref 0.1–1.0)
Monocytes Relative: 16 %
Neutro Abs: 6.6 10*3/uL (ref 1.7–7.7)
Neutrophils Relative %: 60 %
Platelets: 458 10*3/uL — ABNORMAL HIGH (ref 150–400)
RBC: 1.91 MIL/uL — ABNORMAL LOW (ref 3.87–5.11)
RDW: 19.3 % — ABNORMAL HIGH (ref 11.5–15.5)
WBC: 10.8 10*3/uL — ABNORMAL HIGH (ref 4.0–10.5)
nRBC: 0.3 % — ABNORMAL HIGH (ref 0.0–0.2)

## 2022-04-19 LAB — COMPREHENSIVE METABOLIC PANEL
ALT: 20 U/L (ref 0–44)
AST: 25 U/L (ref 15–41)
Albumin: 3.3 g/dL — ABNORMAL LOW (ref 3.5–5.0)
Alkaline Phosphatase: 77 U/L (ref 38–126)
Anion gap: 6 (ref 5–15)
BUN: 7 mg/dL (ref 6–20)
CO2: 26 mmol/L (ref 22–32)
Calcium: 9.2 mg/dL (ref 8.9–10.3)
Chloride: 106 mmol/L (ref 98–111)
Creatinine, Ser: 0.39 mg/dL — ABNORMAL LOW (ref 0.44–1.00)
GFR, Estimated: >60 mL/min (ref >60)
Glucose, Bld: 100 mg/dL — ABNORMAL HIGH (ref 70–99)
Potassium: 4 mmol/L (ref 3.5–5.1)
Sodium: 138 mmol/L (ref 135–145)
Total Bilirubin: 2.1 mg/dL — ABNORMAL HIGH (ref 0.3–1.2)
Total Protein: 6.4 g/dL — ABNORMAL LOW (ref 6.5–8.1)

## 2022-04-19 LAB — CBC
HCT: 21.2 % — ABNORMAL LOW (ref 36.0–46.0)
Hemoglobin: 7.2 g/dL — ABNORMAL LOW (ref 12.0–15.0)
MCH: 31.6 pg (ref 26.0–34.0)
MCHC: 34 g/dL (ref 30.0–36.0)
MCV: 93 fL (ref 80.0–100.0)
Platelets: 467 10*3/uL — ABNORMAL HIGH (ref 150–400)
RBC: 2.28 MIL/uL — ABNORMAL LOW (ref 3.87–5.11)
RDW: 18.1 % — ABNORMAL HIGH (ref 11.5–15.5)
WBC: 9.2 10*3/uL (ref 4.0–10.5)
nRBC: 0.4 % — ABNORMAL HIGH (ref 0.0–0.2)

## 2022-04-19 LAB — PREPARE RBC (CROSSMATCH)

## 2022-04-19 MED ORDER — SODIUM CHLORIDE 0.9 % IV SOLN
1.0000 g | INTRAVENOUS | Status: DC
  Administered 2022-04-19: 1 g via INTRAVENOUS
  Filled 2022-04-19: qty 10

## 2022-04-19 MED ORDER — SODIUM CHLORIDE 0.9% IV SOLUTION: Freq: Once | INTRAVENOUS | Status: AC

## 2022-04-19 MED ORDER — SODIUM CHLORIDE 0.9% IV SOLUTION: Freq: Once | INTRAVENOUS | Status: DC

## 2022-04-19 NOTE — Progress Notes (Signed)
Patient ID: Sandra Mckay, female   DOB: [birth year], 26 y.o.   MRN: [id number] Subjective: Sandra Mckay is a 26 year old female with a medical history significant for sickle cell disease, chronic pain syndrome, opiate dependence and tolerance, bipolar depression, and history of anemia of chronic disease was admitted for pulmonary embolism in the setting of sickle cell pain crisis.  Patient states her general condition is much improved but she is frustrated today because her hemoglobin dropped again despite previous multiple transfusions.  She says she is breathing much better.  Pain has improved.  She denies any fever, chest pain, nausea, vomiting or diarrhea.  Her headache is improved.  Objective:  Vital signs in last 24 hours:  Vitals:   04/19/22 0824 04/19/22 1029 04/19/22 1043 04/19/22 1204  BP:  105/72  110/75  Pulse:  (!) 107  (!) 114  Resp:  16  16  Temp:  98.6 F (37 C)  98.1 F (36.7 C)  TempSrc:  Oral  Oral  SpO2: 99% (!) 89% 98% 94%  Weight:      Height:        Intake/Output from previous day:   Intake/Output Summary (Last 24 hours) at 04/19/2022 1216 Last data filed at 04/19/2022 0700 Gross per 24 hour  Intake 240 ml  Output 1000 ml  Net -760 ml     Physical Exam: General: Alert, awake, oriented x3, in no acute distress but anxious looking.  HEENT: [place]/AT [place], EOMI Neck: Trachea midline,  no masses, no thyromegal,y no JVD, no carotid bruit OROPHARYNX:  Moist, No exudate/ erythema/lesions.  Heart: Tachycardia. Regular rate and rhythm, without murmurs, rubs, gallops, PMI non-displaced, no heaves or thrills on palpation.  Lungs: Clear to auscultation, no wheezing or rhonchi noted. No increased vocal fremitus resonant to percussion  Abdomen: Soft, nontender, nondistended, positive bowel sounds, no masses no hepatosplenomegaly noted..  Neuro: No focal neurological deficits noted cranial nerves II through XII grossly intact. DTRs 2+ bilaterally upper and lower  extremities. Strength 5 out of 5 in bilateral upper and lower extremities. Musculoskeletal: No warm swelling or erythema around joints, no spinal tenderness noted. Psychiatric: Patient alert and oriented x3, good insight and cognition, good recent to remote recall. Anxious looking. Lymph node survey: No cervical axillary or inguinal lymphadenopathy noted.  Lab Results:  Basic Metabolic Panel:    Component Value Date/Time   NA 138 04/19/2022 0432   NA 138 01/14/2022 1136   K 4.0 04/19/2022 0432   CL 106 04/19/2022 0432   CO2 26 04/19/2022 0432   BUN 7 04/19/2022 0432   BUN 9 01/14/2022 1136   CREATININE 0.39 (L) 04/19/2022 0432   CREATININE 0.53 05/31/2017 1145   GLUCOSE 100 (H) 04/19/2022 0432   CALCIUM 9.2 04/19/2022 0432   CBC:    Component Value Date/Time   WBC 10.8 (H) 04/19/2022 0432   HGB 6.1 (LL) 04/19/2022 0432   HGB 9.5 (L) 01/14/2022 1136   HCT 17.7 (L) 04/19/2022 0432   HCT 26.9 (L) 01/14/2022 1136   PLT 458 (H) 04/19/2022 0432   PLT 452 (H) 01/14/2022 1136   MCV 92.7 04/19/2022 0432   MCV 101 (H) 01/14/2022 1136   NEUTROABS 6.6 04/19/2022 0432   NEUTROABS 6.1 01/14/2022 1136   LYMPHSABS 2.2 04/19/2022 0432   LYMPHSABS 1.9 01/14/2022 1136   MONOABS 1.8 (H) 04/19/2022 0432   EOSABS 0.1 04/19/2022 0432   EOSABS 0.1 01/14/2022 1136   BASOSABS 0.1 04/19/2022 0432   BASOSABS 0.1 01/14/2022 1136  No results found for this or any previous visit (from the past 240 hour(s)).  Studies/Results: No results found.  Medications: Scheduled Meds:  sodium chloride   Intravenous Once   apixaban  10 mg Oral BID   Followed by   Derrill Memo ON 04/23/2022] apixaban  5 mg Oral BID   Chlorhexidine Gluconate Cloth  6 each Topical Daily   folic acid  1 mg Oral Daily   gabapentin  300 mg Oral TID   HYDROmorphone   Intravenous Q4H   hydroxyurea  1,500 mg Oral Daily   influenza vac split quadrivalent PF  0.5 mL Intramuscular Tomorrow-1000   QUEtiapine  25 mg Oral QHS    senna-docusate  1 tablet Oral BID   sodium chloride flush  10-40 mL Intracatheter Q12H   Continuous Infusions:  cefTRIAXone (ROCEPHIN)  IV 1 g (04/18/22 1513)   cefTRIAXone (ROCEPHIN)  IV     PRN Meds:.acetaminophen, diphenhydrAMINE, guaiFENesin-dextromethorphan, naloxone **AND** sodium chloride flush, ondansetron (ZOFRAN) IV, polyethylene glycol, sodium chloride flush, tiZANidine  Consultants: None  Procedures: None  Antibiotics: IV ceftriaxone  Assessment/Plan: Principal Problem:   Sickle cell pain crisis ([place]) Active Problems:   Bipolar and related disorder ([place])   Chronic pain   Leukocytosis   Pulmonary embolism (HCC)  Hb Sickle Cell Disease with Pain crisis: IVF at [facility], will continue weight based Dilaudid PCA at current setting, continue IV Toradol 15 mg Q 6 H for total of 5 days, continue oral home pain medications as ordered monitor vitals very closely, Re-evaluate pain scale regularly, 2 L of Oxygen by [place].  Patient encouraged to ambulate today after blood transfusion. Pulmonary embolism: Patient is now on COAG. Continue Eliquis per pharmacy dosing. Acute chest syndrome: Possibly from acute pulmonary embolism, patient is off oxygen supplementation but will occasionally discharge to as low as 89% on room air, which improves to 99% on 1 L of oxygen by nasal cannula.  Patient encouraged to wear her oxygen cannula. Patient is on antibiotics, will continue for now. Continue to monitor very closely. Anemia of Chronic Disease: Patient is s/p transfusion of 3 units of PRBCs.  Hemoglobin dropped to 6.1 this morning.  We will transfuse her 1 more unit of packed red blood cells today.  We will continue to monitor closely, check labs in AM. Chronic pain Syndrome: Continue home pain medications as ordered. Bipolar depression: Stable. Patient denies any suicidal ideations or thoughts. We will continue her home medications.  Monitor closely.  Patient counseled extensively  Code Status: Full  Code Family Communication: Discussed at length with mother at bedside.  All questions were answered, all concerns were addressed. Disposition Plan: Possible discharge home on 04/21/2022  Duaa Stelzner  If 7PM-7AM, please contact night-coverage.  04/19/2022, 12:16 PM  LOS: 5 days

## 2022-04-19 NOTE — Progress Notes (Signed)
Pt's hgb 6.1. On call provider notified. Awaiting new orders.

## 2022-04-19 NOTE — Progress Notes (Signed)
pt is refusing blood products at this time. Provider on call notified.

## 2022-04-20 ENCOUNTER — Telehealth: Payer: Self-pay

## 2022-04-20 ENCOUNTER — Other Ambulatory Visit: Payer: Self-pay

## 2022-04-20 DIAGNOSIS — D5701 Hb-SS disease with acute chest syndrome: Principal | ICD-10-CM

## 2022-04-20 DIAGNOSIS — F319 Bipolar disorder, unspecified: Secondary | ICD-10-CM | POA: Diagnosis not present

## 2022-04-20 DIAGNOSIS — D5700 Hb-SS disease with crisis, unspecified: Secondary | ICD-10-CM | POA: Diagnosis not present

## 2022-04-20 DIAGNOSIS — D571 Sickle-cell disease without crisis: Secondary | ICD-10-CM

## 2022-04-20 DIAGNOSIS — E559 Vitamin D deficiency, unspecified: Secondary | ICD-10-CM

## 2022-04-20 DIAGNOSIS — G894 Chronic pain syndrome: Secondary | ICD-10-CM

## 2022-04-20 DIAGNOSIS — I2699 Other pulmonary embolism without acute cor pulmonale: Secondary | ICD-10-CM

## 2022-04-20 LAB — TYPE AND SCREEN
ABO/RH(D): A POS
Antibody Screen: NEGATIVE
Donor AG Type: NEGATIVE
Unit division: 0

## 2022-04-20 LAB — BPAM RBC
Blood Product Expiration Date: 202310152359
ISSUE DATE / TIME: 202309171157
Unit Type and Rh: 5100

## 2022-04-20 LAB — CBC WITH DIFFERENTIAL/PLATELET
Abs Immature Granulocytes: 0.05 10*3/uL (ref 0.00–0.07)
Basophils Absolute: 0.1 10*3/uL (ref 0.0–0.1)
Basophils Relative: 1 %
Eosinophils Absolute: 0.1 10*3/uL (ref 0.0–0.5)
Eosinophils Relative: 1 %
HCT: 22.5 % — ABNORMAL LOW (ref 36.0–46.0)
Hemoglobin: 7.6 g/dL — ABNORMAL LOW (ref 12.0–15.0)
Immature Granulocytes: 1 %
Lymphocytes Relative: 28 %
Lymphs Abs: 2.9 10*3/uL (ref 0.7–4.0)
MCH: 31.5 pg (ref 26.0–34.0)
MCHC: 33.8 g/dL (ref 30.0–36.0)
MCV: 93.4 fL (ref 80.0–100.0)
Monocytes Absolute: 1.7 10*3/uL — ABNORMAL HIGH (ref 0.1–1.0)
Monocytes Relative: 17 %
Neutro Abs: 5.3 10*3/uL (ref 1.7–7.7)
Neutrophils Relative %: 52 %
Platelets: 524 10*3/uL — ABNORMAL HIGH (ref 150–400)
RBC: 2.41 MIL/uL — ABNORMAL LOW (ref 3.87–5.11)
RDW: 18.3 % — ABNORMAL HIGH (ref 11.5–15.5)
WBC: 10.2 10*3/uL (ref 4.0–10.5)
nRBC: 0.4 % — ABNORMAL HIGH (ref 0.0–0.2)

## 2022-04-20 MED ORDER — HYDROXYUREA 500 MG PO CAPS: 1500.0000 mg | ORAL_CAPSULE | Freq: Every day | ORAL | 1 refills | Status: DC

## 2022-04-20 MED ORDER — AMOXICILLIN-POT CLAVULANATE 875-125 MG PO TABS: 1.0000 | ORAL_TABLET | Freq: Two times a day (BID) | ORAL | 0 refills | Status: AC

## 2022-04-20 MED ORDER — VITAMIN D (ERGOCALCIFEROL) 1.25 MG (50000 UNIT) PO CAPS: 50000.0000 [IU] | ORAL_CAPSULE | ORAL | 0 refills | Status: AC

## 2022-04-20 MED ORDER — HEPARIN SOD (PORK) LOCK FLUSH 100 UNIT/ML IV SOLN
INTRAVENOUS | Status: AC
  Filled 2022-04-20: qty 5

## 2022-04-20 MED ORDER — OXYCODONE HCL 15 MG PO TABS: 15.0000 mg | ORAL_TABLET | Freq: Four times a day (QID) | ORAL | 0 refills | Status: DC | PRN

## 2022-04-20 MED ORDER — XTAMPZA ER 13.5 MG PO C12A: 13.5000 mg | EXTENDED_RELEASE_CAPSULE | Freq: Two times a day (BID) | ORAL | 0 refills | Status: DC

## 2022-04-20 MED ORDER — APIXABAN 5 MG PO TABS: 5.0000 mg | ORAL_TABLET | Freq: Two times a day (BID) | ORAL | 3 refills | Status: DC

## 2022-04-20 MED ORDER — APIXABAN 5 MG PO TABS: 10.0000 mg | ORAL_TABLET | Freq: Two times a day (BID) | ORAL | 0 refills | Status: DC

## 2022-04-20 MED ORDER — FOLIC ACID 1 MG PO TABS: 1.0000 mg | ORAL_TABLET | Freq: Every day | ORAL | 3 refills | Status: DC

## 2022-04-20 NOTE — Plan of Care (Signed)
Patient being discharged home. PCA discontinued. Port deaccessed and tele removed per order. Discharge teaching completed and all questions answered.    Problem: Education: Goal: Knowledge of vaso-occlusive preventative measures will improve Outcome: Adequate for Discharge Goal: Awareness of infection prevention will improve Outcome: Adequate for Discharge Goal: Awareness of signs and symptoms of anemia will improve Outcome: Adequate for Discharge Goal: Long-term complications will improve Outcome: Adequate for Discharge   Problem: Self-Care: Goal: Ability to incorporate actions that prevent/reduce pain crisis will improve Outcome: Adequate for Discharge   Problem: Bowel/Gastric: Goal: Gut motility will be maintained Outcome: Adequate for Discharge   Problem: Tissue Perfusion: Goal: Complications related to inadequate tissue perfusion will be avoided or minimized Outcome: Adequate for Discharge   Problem: Respiratory: Goal: Pulmonary complications will be avoided or minimized Outcome: Adequate for Discharge Goal: Acute Chest Syndrome will be identified early to prevent complications Outcome: Adequate for Discharge   Problem: Fluid Volume: Goal: Ability to maintain a balanced intake and output will improve Outcome: Adequate for Discharge   Problem: Sensory: Goal: Pain level will decrease with appropriate interventions Outcome: Adequate for Discharge   Problem: Health Behavior: Goal: Postive changes in compliance with treatment and prescription regimens will improve Outcome: Adequate for Discharge   Problem: Education: Goal: Knowledge of General Education information will improve Description: Including pain rating scale, medication(s)/side effects and non-pharmacologic comfort measures Outcome: Adequate for Discharge   Problem: Health Behavior/Discharge Planning: Goal: Ability to manage health-related needs will improve Outcome: Adequate for Discharge   Problem:  Clinical Measurements: Goal: Ability to maintain clinical measurements within normal limits will improve Outcome: Adequate for Discharge Goal: Will remain free from infection Outcome: Adequate for Discharge Goal: Diagnostic test results will improve Outcome: Adequate for Discharge Goal: Respiratory complications will improve Outcome: Adequate for Discharge Goal: Cardiovascular complication will be avoided Outcome: Adequate for Discharge   Problem: Activity: Goal: Risk for activity intolerance will decrease Outcome: Adequate for Discharge   Problem: Nutrition: Goal: Adequate nutrition will be maintained Outcome: Adequate for Discharge   Problem: Coping: Goal: Level of anxiety will decrease Outcome: Adequate for Discharge   Problem: Elimination: Goal: Will not experience complications related to bowel motility Outcome: Adequate for Discharge Goal: Will not experience complications related to urinary retention Outcome: Adequate for Discharge   Problem: Pain Managment: Goal: General experience of comfort will improve Outcome: Adequate for Discharge   Problem: Safety: Goal: Ability to remain free from injury will improve Outcome: Adequate for Discharge   Problem: Skin Integrity: Goal: Risk for impaired skin integrity will decrease Outcome: Adequate for Discharge

## 2022-04-20 NOTE — TOC Transition Note (Signed)
Transition of Care [facility]) - CM/SW Discharge Note   Patient Details  Name: Sandra Mckay MRN: [id number] Date of Birth: [birth year]  [facility] of [facility]) CM/SW Contact:  Leeroy Cha, RN Phone Number: 04/20/2022, 11:17 AM   Clinical Narrative:    Patient dcd to return home with no toc needs   Final next level of care: Home/Self Care Barriers to Discharge: No Barriers Identified   Patient Goals and CMS Choice Patient states their goals for this hospitalization and ongoing recovery are:: not stated      Discharge Placement                       Discharge Plan and Services   Discharge Planning Services: CM Consult                                 Social Determinants of Health (SDOH) Interventions Utilities Interventions: Intervention Not Indicated   Readmission Risk Interventions   No data to display

## 2022-04-20 NOTE — Telephone Encounter (Signed)
PA submitted for [employer] Key: [id number], waiting on decision.

## 2022-04-20 NOTE — Discharge Summary (Signed)
Physician Discharge Summary  Sandra Mckay VCB:[id number] DOB: 1995/10/19 DOA: 04/14/2022  PCP: Dorena Dew, FNP  Admit date: 04/14/2022  Discharge date: 04/20/2022  Discharge Diagnoses:  Principal Problem:   Sickle cell pain crisis ([place]) Active Problems:   Bipolar and related disorder ([place])   Chronic pain   Leukocytosis   Pulmonary embolism ([place])   Discharge Condition: Stable  Disposition:   Follow-up Information     Dorena Dew, FNP. Schedule an appointment as soon as possible for a visit in 1 week(s).   Specialty: Family Medicine Contact information: [place]. [place] [address] [phone]                Pt is discharged home in good condition and is to follow up with Dorena Dew, FNP this week to have labs evaluated. Sandra Mckay is instructed to increase activity slowly and balance with rest for the next few days, and use prescribed medication to complete treatment of pain  Diet: Regular Wt Readings from Last 3 Encounters:  04/14/22 61 kg  03/27/22 61.4 kg  03/06/22 58.7 kg    History of present illness:  Sandra Mckay  is a 26 y.o. female with a medical history significant for sickle cell disease, chronic pain syndrome, opiate dependence and tolerance, history of polysubstance abuse, and anemia of chronic disease presented to the emergency department with complaints of shortness of breath worsening over the past 2 days.  Also, patient has low back and lower extremity pain that is consistent with her typical sickle cell pain crisis.  Patient states that 2 days ago she developed shortness of breath with exertion, it slowly became hard to breathe with any movements.  Patient has been taking home pain medications that include Xtampza and oxycodone without any sustained relief.  She currently rates pain as 10/10, constant, and occasionally sharp.  Patient is very tearful and accompanied by her mother.  She denies any  headache, dizziness, or paresthesias.  No nausea, vomiting, diarrhea, or urinary symptoms.  No sick contacts, recent travel, or known exposure to COVID-19.   ER course: While in the ER, CT angiogram showed pulmonary embolism, positive examination, with segmental to subsegmental embolus present in the bilateral lower lobes and left lower lobe. Currently, patient's hemoglobin is 6.9 g/dL, WBCs 14.4, and platelets 408,000. Comprehensive metabolic panel showed creatinine 0.38, and total bilirubin 2.0, otherwise unremarkable.  Patient's pain persists despite IV Dilaudid, IV fluids.  Patient will be admitted for pulmonary embolism in the setting of sickle cell pain crisis.  Hospital Course:  Patient was admitted for acute pulmonary embolism in the setting of sickle cell crisis and managed appropriately with IVF, initially IV heparin drip per pharmacy protocol, IV Dilaudid via PCA and IV Toradol, as well as other adjunct therapies per sickle cell pain management protocols.  She was subsequently bridged to [place].  Her hemoglobin dropped considerably below her baseline for which she received a total unit of units of packed red blood cell during this admission at different times with eventual improvement in hemoglobin back to baseline of between 7 and 8.  At some point during this admission, patient had severe headache for which she was evaluated with CT head which was negative for any acute process.  Patient was symptomatically managed with significant improvement.  Her shortness of breath and cough slowly improved, pain improved to baseline as well as her heart rate which has been on the high side from the beginning of  admission.  She also developed high-grade fever, attributable to possible pneumonia for which patient was treated with IV antibiotics ceftriaxone on azithromycin, she got total of 5 days of IV antibiotics and subsequently transition to oral Augmentin upon discharge.  As of today, patient is  doing very well, she says she feels like she is back to her baseline and ready to go home, she is ambulating well with no shortness of breath or tachycardia.  She is eating and drinking well with no restrictions.  She is breathing and saturating 97% of oxygen on room air.  She has remained afebrile for more than 72 hours. Patient was therefore discharged home today in a hemodynamically stable condition.  She was prescribed Eliquis to complete the initial loading dose of 7 days total and then maintenance dose, Augmentin for 5 days to complete antibiotic regimen, all her home medications including hydroxyurea and folic acid and that her home pain medications were prescribed.  Sandra Mckay will follow-up with PCP within 1 week of this discharge. Sandra Mckay was counseled extensively about nonpharmacologic means of pain management, patient verbalized understanding and was appreciative of  the care received during this admission.   We discussed the need for good hydration, monitoring of hydration status, avoidance of heat, cold, stress, and infection triggers. We discussed the need to be adherent with taking Hydrea and other home medications. Patient was reminded of the need to seek medical attention immediately if any symptom of bleeding, anemia, or infection occurs.  Discharge Exam: Vitals:   04/20/22 0844 04/20/22 1000  BP:  106/71  Pulse:  85  Resp: 16 16  Temp:  98.7 F (37.1 C)  SpO2: 97% 96%   Vitals:   04/20/22 0453 04/20/22 0550 04/20/22 0844 04/20/22 1000  BP:  105/79  106/71  Pulse:  (!) 102  85  Resp: '14 16 16 16  '$ Temp:  98.9 F (37.2 C)  98.7 F (37.1 C)  TempSrc:  Oral  Oral  SpO2: 96% 96% 97% 96%  Weight:      Height:        General appearance : Awake, alert, not in any distress. Speech Clear. Not toxic looking HEENT: Atraumatic and Normocephalic, pupils equally reactive to light and accomodation Neck: Supple, no JVD. No cervical lymphadenopathy.  Chest: Good air entry bilaterally,  no added sounds  CVS: S1 S2 regular, no murmurs.  Abdomen: Bowel sounds present, Non tender and not distended with no gaurding, rigidity or rebound. Extremities: B/L Lower Ext shows no edema, both legs are warm to touch Neurology: Awake alert, and oriented X 3, CN II-XII intact, Non focal Skin: No Rash  Discharge Instructions  Discharge Instructions     Diet - low sodium heart healthy   Complete by: As directed    Increase activity slowly   Complete by: As directed       Allergies as of 04/20/2022       Reactions   Latex Rash   Wound Dressing Adhesive Rash        Medication List     TAKE these medications    acetaminophen 500 MG tablet Commonly known as: TYLENOL Take 1,000 mg by mouth as needed (pain).   albuterol 108 (90 Base) MCG/ACT inhaler Commonly known as: VENTOLIN HFA Inhale 3-4 puffs into the lungs 2 (two) times daily as needed for wheezing or shortness of breath.   amoxicillin-clavulanate 875-125 MG tablet Commonly known as: AUGMENTIN Take 1 tablet by mouth 2 (two) times daily for 5 days.  folic acid 1 MG tablet Commonly known as: FOLVITE Take 1 tablet (1 mg total) by mouth daily.   gabapentin 100 MG capsule Commonly known as: NEURONTIN Take 3 capsules (300 mg total) by mouth 3 (three) times daily. What changed:  how much to take when to take this   hydroxyurea 500 MG capsule Commonly known as: HYDREA Take 3 capsules (1,500 mg total) by mouth daily. TAKE 3 CAPSULES (1,500 MG TOTAL) BY MOUTH DAILY. What changed: additional instructions   oxyCODONE 15 MG immediate release tablet Commonly known as: ROXICODONE Take 1 tablet (15 mg total) by mouth every 6 (six) hours as needed for pain.   QUEtiapine 25 MG tablet Commonly known as: SEROquel Take 1 tablet (25 mg total) by mouth at bedtime. What changed: when to take this   tiZANidine 4 MG tablet Commonly known as: ZANAFLEX Take 1 tablet (4 mg total) by mouth 3 (three) times daily. What  changed:  when to take this reasons to take this   Vitamin D (Ergocalciferol) 1.25 MG (50000 UNIT) Caps capsule Commonly known as: DRISDOL Take 1 capsule (50,000 Units total) by mouth every 7 (seven) days for 8 doses.   Xtampza ER 13.5 MG C12a Generic drug: oxyCODONE ER Take 13.5 mg by mouth every 12 (twelve) hours for 15 days.        The results of significant diagnostics from this hospitalization (including imaging, microbiology, ancillary and laboratory) are listed below for reference.    Significant Diagnostic Studies: CT HEAD WO CONTRAST (5MM)  Result Date: 04/15/2022 CLINICAL DATA:  Headache EXAM: CT HEAD WITHOUT CONTRAST TECHNIQUE: Contiguous axial images were obtained from the base of the skull through the vertex without intravenous contrast. RADIATION DOSE REDUCTION: This exam was performed according to the departmental dose-optimization program which includes automated exposure control, adjustment of the mA and/or kV according to patient size and/or use of iterative reconstruction technique. COMPARISON:  None Available. FINDINGS: Brain: No evidence of acute infarction, hemorrhage, hydrocephalus, extra-axial collection or mass lesion/mass effect. Image quality degraded by motion Vascular: Negative for hyperdense vessel Skull: Negative Sinuses/Orbits: Paranasal sinuses clear.  Normal orbit Other: None IMPRESSION: Negative CT head Motion degraded study. Electronically Signed   By: Franchot Gallo M.D.   On: 04/15/2022 11:43   CT Angio Chest PE W/Cm &/Or Wo Cm  Result Date: 04/14/2022 CLINICAL DATA:  PE suspected, history of sickle cell disease EXAM: CT ANGIOGRAPHY CHEST WITH CONTRAST TECHNIQUE: Multidetector CT imaging of the chest was performed using the standard protocol during bolus administration of intravenous contrast. Multiplanar CT image reconstructions and MIPs were obtained to evaluate the vascular anatomy. RADIATION DOSE REDUCTION: This exam was performed according to the  departmental dose-optimization program which includes automated exposure control, adjustment of the mA and/or kV according to patient size and/or use of iterative reconstruction technique. CONTRAST:  67m OMNIPAQUE IOHEXOL 350 MG/ML SOLN COMPARISON:  None Available. FINDINGS: Cardiovascular: Right chest port catheter. Examination for pulmonary embolism is substantially limited by breath motion artifact. Within this limitation, positive examination, with segmental to subsegmental embolus present in the bilateral lower lobes (series 6, image 161) and left upper lobe (series 6, image 106). Global cardiomegaly. RV LV ratio is borderline, 0.96. No pericardial effusion. Mediastinum/Nodes: No enlarged mediastinal, hilar, or axillary lymph nodes. Thymic remnant in the anterior mediastinum. Thyroid gland, trachea, and esophagus demonstrate no significant findings. Lungs/Pleura: Heterogeneous and ground-glass airspace opacity in the bilateral lung bases with trace left pleural effusion (series 7, image 89). Upper Abdomen: No acute  abnormality. Musculoskeletal: No chest wall abnormality. No acute osseous findings. Review of the MIP images confirms the above findings. IMPRESSION: 1. Examination for pulmonary embolism is substantially limited by breath motion artifact. Within this limitation, positive examination, with segmental to subsegmental embolus present in the bilateral lower lobes and left upper lobe. 2. Mild global cardiomegaly.  RV LV ratio is borderline at 0.96. 3. Heterogeneous and ground-glass airspace opacity in the bilateral lung bases with trace left pleural effusion, generally in keeping with clinical concern for acute chest in the setting of sickle cell disease. These results were called by telephone at the time of interpretation on 04/14/2022 at 9:28 am to Dr. Lacretia Leigh , who verbally acknowledged these results. Electronically Signed   By: Delanna Ahmadi M.D.   On: 04/14/2022 09:30   DG Chest 2  View  Result Date: 04/14/2022 CLINICAL DATA:  Sickle cell crisis.  Dyspnea. EXAM: CHEST - 2 VIEW COMPARISON:  08/06/2021 FINDINGS: Streaky opacities are seen in both lung bases, new in the interval. No pulmonary edema or substantial pleural effusion. The cardio pericardial silhouette is enlarged. Right Port-A-Cath again noted. Telemetry leads overlie the chest. IMPRESSION: New streaky opacities in both lung bases. In the appropriate clinical setting, findings could be compatible with acute chest syndrome in this patient with a history of sickle cell disease. Electronically Signed   By: Misty Stanley M.D.   On: 04/14/2022 07:37    Microbiology: No results found for this or any previous visit (from the past 240 hour(s)).   Labs: Basic Metabolic Panel: Recent Labs  Lab 04/14/22 0738 04/17/22 1110 04/19/22 0432  NA 139 135 138  K 4.2 3.1* 4.0  CL 112* 102 106  CO2 21* 27 26  GLUCOSE 112* 99 100*  BUN 6 5* 7  CREATININE 0.38* <0.30* 0.39*  CALCIUM 9.5 8.7* 9.2   Liver Function Tests: Recent Labs  Lab 04/14/22 0738 04/17/22 1110 04/19/22 0432  AST '30 17 25  '$ ALT '23 15 20  '$ ALKPHOS 75 68 77  BILITOT 2.0* 3.1* 2.1*  PROT 7.6 6.3* 6.4*  ALBUMIN 4.6 3.4* 3.3*   No results for input(s): "LIPASE", "AMYLASE" in the last 168 hours. No results for input(s): "AMMONIA" in the last 168 hours. CBC: Recent Labs  Lab 04/14/22 0738 04/15/22 0800 04/16/22 0619 04/16/22 1815 04/17/22 0625 04/19/22 0432 04/19/22 1700 04/20/22 0500  WBC 14.4*   < > 35.2*  --  21.1* 10.8* 9.2 10.2  NEUTROABS 9.4*  --   --   --   --  6.6  --  5.3  HGB 6.9*   < > 6.9* 7.9* 7.0* 6.1* 7.2* 7.6*  HCT 19.3*   < > 19.4* 22.2* 20.0* 17.7* 21.2* 22.5*  MCV 102.1*   < > 95.1  --  92.2 92.7 93.0 93.4  PLT 408*   < > 355  --  349 458* 467* 524*   < > = values in this interval not displayed.   Cardiac Enzymes: No results for input(s): "CKTOTAL", "CKMB", "CKMBINDEX", "TROPONINI" in the last 168 hours. BNP: Invalid  input(s): "POCBNP" CBG: No results for input(s): "GLUCAP" in the last 168 hours.  Time coordinating discharge: 50 minutes  Signed:  [facility] 04/20/2022, 11:09 AM

## 2022-04-21 NOTE — Telephone Encounter (Signed)
PA approved until 07/20/22

## 2022-04-24 ENCOUNTER — Ambulatory Visit (INDEPENDENT_AMBULATORY_CARE_PROVIDER_SITE_OTHER): Payer: Medicaid Other | Admitting: Clinical

## 2022-04-24 DIAGNOSIS — F319 Bipolar disorder, unspecified: Secondary | ICD-10-CM | POA: Diagnosis not present

## 2022-04-24 DIAGNOSIS — F419 Anxiety disorder, unspecified: Secondary | ICD-10-CM
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
## 2022-05-01 ENCOUNTER — Other Ambulatory Visit: Payer: Self-pay | Admitting: Family Medicine

## 2022-05-01 ENCOUNTER — Ambulatory Visit (INDEPENDENT_AMBULATORY_CARE_PROVIDER_SITE_OTHER): Payer: Medicaid Other | Admitting: Clinical

## 2022-05-01 DIAGNOSIS — F319 Bipolar disorder, unspecified: Secondary | ICD-10-CM | POA: Diagnosis not present

## 2022-05-01 DIAGNOSIS — G894 Chronic pain syndrome: Secondary | ICD-10-CM

## 2022-05-01 DIAGNOSIS — F419 Anxiety disorder, unspecified: Secondary | ICD-10-CM

## 2022-05-01 DIAGNOSIS — D571 Sickle-cell disease without crisis: Secondary | ICD-10-CM

## 2022-05-01 MED ORDER — OXYCODONE HCL 15 MG PO TABS: 15.0000 mg | ORAL_TABLET | Freq: Four times a day (QID) | ORAL | 0 refills | Status: DC | PRN

## 2022-05-01 MED ORDER — XTAMPZA ER 13.5 MG PO C12A: 13.5000 mg | EXTENDED_RELEASE_CAPSULE | Freq: Two times a day (BID) | ORAL | 0 refills | Status: AC

## 2022-05-01 NOTE — Progress Notes (Signed)
Reviewed PDMP substance reporting system prior to prescribing opiate medications. No inconsistencies noted.  Meds ordered this encounter  Medications   oxyCODONE (ROXICODONE) 15 MG immediate release tablet    Sig: Take 1 tablet (15 mg total) by mouth every 6 (six) hours as needed for pain.    Dispense:  60 tablet    Refill:  0    Order Specific Question:   Supervising Provider    Answer:   Tresa Garter [[id number]]   oxyCODONE ER (XTAMPZA ER) 13.5 MG C12A    Sig: Take 13.5 mg by mouth every 12 (twelve) hours for 15 days.    Dispense:  30 capsule    Refill:  0    Order Specific Question:   Supervising Provider    Answer:   Tresa Garter [[id number]]     [name]  APRN, MSN, FNP-C [name] [address] [place], [place] [address] [phone]
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
## 2022-05-04 ENCOUNTER — Non-Acute Institutional Stay (HOSPITAL_COMMUNITY)
Admission: AD | Admit: 2022-05-04 | Discharge: 2022-05-04 | Disposition: A | Discharge status: Home / Self Care | Payer: Medicaid Other | Source: Ambulatory Visit | Attending: Internal Medicine | Admitting: Internal Medicine

## 2022-05-04 ENCOUNTER — Telehealth (HOSPITAL_COMMUNITY): Payer: Self-pay | Admitting: *Deleted

## 2022-05-04 DIAGNOSIS — D5700 Hb-SS disease with crisis, unspecified: Secondary | ICD-10-CM | POA: Diagnosis present

## 2022-05-04 DIAGNOSIS — D638 Anemia in other chronic diseases classified elsewhere: Secondary | ICD-10-CM | POA: Diagnosis not present

## 2022-05-04 DIAGNOSIS — G894 Chronic pain syndrome: Secondary | ICD-10-CM | POA: Diagnosis not present

## 2022-05-04 DIAGNOSIS — F1120 Opioid dependence, uncomplicated: Secondary | ICD-10-CM | POA: Insufficient documentation

## 2022-05-04 LAB — CBC WITH DIFFERENTIAL/PLATELET
Abs Immature Granulocytes: 0.01 10*3/uL (ref 0.00–0.07)
Basophils Absolute: 0.1 10*3/uL (ref 0.0–0.1)
Basophils Relative: 1 %
Eosinophils Absolute: 0 10*3/uL (ref 0.0–0.5)
Eosinophils Relative: 0 %
HCT: 25.3 % — ABNORMAL LOW (ref 36.0–46.0)
Hemoglobin: 8.4 g/dL — ABNORMAL LOW (ref 12.0–15.0)
Immature Granulocytes: 0 %
Lymphocytes Relative: 47 %
Lymphs Abs: 2.7 10*3/uL (ref 0.7–4.0)
MCH: 33.5 pg (ref 26.0–34.0)
MCHC: 33.2 g/dL (ref 30.0–36.0)
MCV: 100.8 fL — ABNORMAL HIGH (ref 80.0–100.0)
Monocytes Absolute: 1 10*3/uL (ref 0.1–1.0)
Monocytes Relative: 17 %
Neutro Abs: 2 10*3/uL (ref 1.7–7.7)
Neutrophils Relative %: 35 %
Platelets: 590 10*3/uL — ABNORMAL HIGH (ref 150–400)
RBC: 2.51 MIL/uL — ABNORMAL LOW (ref 3.87–5.11)
RDW: 19.5 % — ABNORMAL HIGH (ref 11.5–15.5)
WBC: 5.8 10*3/uL (ref 4.0–10.5)
nRBC: 0.3 % — ABNORMAL HIGH (ref 0.0–0.2)

## 2022-05-04 LAB — COMPREHENSIVE METABOLIC PANEL WITH GFR
ALT: 19 U/L (ref 0–44)
AST: 25 U/L (ref 15–41)
Albumin: 4.3 g/dL (ref 3.5–5.0)
Alkaline Phosphatase: 77 U/L (ref 38–126)
Anion gap: 6 (ref 5–15)
BUN: 10 mg/dL (ref 6–20)
CO2: 24 mmol/L (ref 22–32)
Calcium: 9.1 mg/dL (ref 8.9–10.3)
Chloride: 108 mmol/L (ref 98–111)
Creatinine, Ser: 0.4 mg/dL — ABNORMAL LOW (ref 0.44–1.00)
GFR, Estimated: >60 mL/min
Glucose, Bld: 93 mg/dL (ref 70–99)
Potassium: 3.8 mmol/L (ref 3.5–5.1)
Sodium: 138 mmol/L (ref 135–145)
Total Bilirubin: 1.6 mg/dL — ABNORMAL HIGH (ref 0.3–1.2)
Total Protein: 7.2 g/dL (ref 6.5–8.1)

## 2022-05-04 LAB — RETICULOCYTES
Immature Retic Fract: 31.9 % — ABNORMAL HIGH (ref 2.3–15.9)
RBC.: 2.55 MIL/uL — ABNORMAL LOW (ref 3.87–5.11)
Retic Count, Absolute: 380.5 10*3/uL — ABNORMAL HIGH (ref 19.0–186.0)
Retic Ct Pct: 8.7 % — ABNORMAL HIGH (ref 0.4–3.1)

## 2022-05-04 LAB — TYPE AND SCREEN
ABO/RH(D): A POS
Antibody Screen: NEGATIVE

## 2022-05-04 MED ORDER — NALOXONE HCL 0.4 MG/ML IJ SOLN: 0.4000 mg | INTRAMUSCULAR | Status: DC | PRN

## 2022-05-04 MED ORDER — ACETAMINOPHEN 500 MG PO TABS
1000.0000 mg | ORAL_TABLET | Freq: Once | ORAL | Status: AC
  Administered 2022-05-04: 1000 mg via ORAL
  Filled 2022-05-04: qty 2

## 2022-05-04 MED ORDER — ONDANSETRON HCL 4 MG/2ML IJ SOLN
4.0000 mg | Freq: Four times a day (QID) | INTRAMUSCULAR | Status: DC | PRN
  Administered 2022-05-04: 4 mg via INTRAVENOUS
  Filled 2022-05-04: qty 2

## 2022-05-04 MED ORDER — SODIUM CHLORIDE 0.9% FLUSH: 9.0000 mL | INTRAVENOUS | Status: DC | PRN

## 2022-05-04 MED ORDER — HEPARIN SOD (PORK) LOCK FLUSH 100 UNIT/ML IV SOLN
500.0000 [IU] | INTRAVENOUS | Status: AC | PRN
  Administered 2022-05-04: 500 [IU]

## 2022-05-04 MED ORDER — HYDROMORPHONE 1 MG/ML IV SOLN
INTRAVENOUS | Status: DC
  Administered 2022-05-04: 13 mg via INTRAVENOUS
  Filled 2022-05-04: qty 30

## 2022-05-04 MED ORDER — KETOROLAC TROMETHAMINE 30 MG/ML IJ SOLN
15.0000 mg | Freq: Once | INTRAMUSCULAR | Status: AC
  Administered 2022-05-04: 15 mg via INTRAVENOUS
  Filled 2022-05-04: qty 1

## 2022-05-04 MED ORDER — DIPHENHYDRAMINE HCL 25 MG PO CAPS: 25.0000 mg | ORAL_CAPSULE | ORAL | Status: DC | PRN

## 2022-05-04 MED ORDER — SODIUM CHLORIDE 0.45 % IV SOLN: INTRAVENOUS | Status: DC

## 2022-05-04 MED ORDER — SODIUM CHLORIDE 0.9% FLUSH
10.0000 mL | INTRAVENOUS | Status: AC | PRN
  Administered 2022-05-04: 10 mL
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
## 2022-05-04 NOTE — Telephone Encounter (Signed)
Patient called requesting to come to the day hospital for sickle cell pain. Patient reports lower back and bilateral leg pain rated 7/10. Reports taking Oxycodone yesterday. Patient is currently out of pain medication but will be able to pick her Xtampza up today and her Oxycodone tomorrow. COVID-19 screening done and patient denies all symptoms and exposures. Denies fever, chest pain, nausea, vomiting, diarrhea and abdominal pain. Admits to having transportation without driving self. Per patient her mother will pick her up at discharge. [place], [place] notified. Patient can come to the day hospital for pain management. Patient advised and expresses an understanding.

## 2022-05-04 NOTE — Progress Notes (Signed)
Pt admitted to the day hospital by [place] Hollis FNP for treatment of sickle cell pain crisis. Pt reported generalized pain rated 8/10. PAC accessed using sterile technique, brisk blood return noted, labs collected per orders. Pt given PO Tylenol, IV zofran and toradol, placed on Dilaudid PCA 0.5/10/3, and hydrated with 1/2 NS IV fluids. At discharge patient reported pain a 6/10. Declined AVS. Pt alert , oriented and ambulatory at discharge.

## 2022-05-07 NOTE — Discharge Summary (Addendum)
[facility] Discharge Summary   Patient ID: Sandra Mckay MRN: [id number] DOB/AGE: 30-Jun-1996 26 y.o.  Admit date: 05/04/2022 Discharge date: 05/04/2022  Primary Care Physician:  Dorena Dew, FNP  Admission Diagnoses:  Active Problems:   Sickle cell pain crisis [facility])   Discharge Medications:  Allergies as of 05/04/2022       Reactions   Latex Rash   Wound Dressing Adhesive Rash        Medication List     ASK your doctor about these medications    acetaminophen 500 MG tablet Commonly known as: TYLENOL Take 1,000 mg by mouth as needed (pain).   albuterol 108 (90 Base) MCG/ACT inhaler Commonly known as: VENTOLIN HFA Inhale 3-4 puffs into the lungs 2 (two) times daily as needed for wheezing or shortness of breath.   apixaban 5 MG Tabs tablet Commonly known as: ELIQUIS Take 2 tablets (10 mg total) by mouth 2 (two) times daily for 4 days.   apixaban 5 MG Tabs tablet Commonly known as: ELIQUIS Take 1 tablet (5 mg total) by mouth 2 (two) times daily.   folic acid 1 MG tablet Commonly known as: FOLVITE Take 1 tablet (1 mg total) by mouth daily.   gabapentin 100 MG capsule Commonly known as: NEURONTIN Take 3 capsules (300 mg total) by mouth 3 (three) times daily.   hydroxyurea 500 MG capsule Commonly known as: HYDREA Take 3 capsules (1,500 mg total) by mouth daily. TAKE 3 CAPSULES (1,500 MG TOTAL) BY MOUTH DAILY.   oxyCODONE 15 MG immediate release tablet Commonly known as: ROXICODONE Take 1 tablet (15 mg total) by mouth every 6 (six) hours as needed for pain.   QUEtiapine 25 MG tablet Commonly known as: SEROquel Take 1 tablet (25 mg total) by mouth at bedtime.   tiZANidine 4 MG tablet Commonly known as: ZANAFLEX Take 1 tablet (4 mg total) by mouth 3 (three) times daily.   Vitamin D (Ergocalciferol) 1.25 MG (50000 UNIT) Caps capsule Commonly known as: DRISDOL Take 1 capsule (50,000 Units total) by mouth every 7 (seven) days for 8  doses.   Xtampza ER 13.5 MG C12a Generic drug: oxyCODONE ER Take 13.5 mg by mouth every 12 (twelve) hours for 15 days.         Consults:  None  Significant Diagnostic Studies:  CT HEAD WO CONTRAST (5MM)  Result Date: 04/15/2022 CLINICAL DATA:  Headache EXAM: CT HEAD WITHOUT CONTRAST TECHNIQUE: Contiguous axial images were obtained from the base of the skull through the vertex without intravenous contrast. RADIATION DOSE REDUCTION: This exam was performed according to the departmental dose-optimization program which includes automated exposure control, adjustment of the mA and/or kV according to patient size and/or use of iterative reconstruction technique. COMPARISON:  None Available. FINDINGS: Brain: No evidence of acute infarction, hemorrhage, hydrocephalus, extra-axial collection or mass lesion/mass effect. Image quality degraded by motion Vascular: Negative for hyperdense vessel Skull: Negative Sinuses/Orbits: Paranasal sinuses clear.  Normal orbit Other: None IMPRESSION: Negative CT head Motion degraded study. Electronically Signed   By: Franchot Gallo M.D.   On: 04/15/2022 11:43   CT Angio Chest PE W/Cm &/Or Wo Cm  Result Date: 04/14/2022 CLINICAL DATA:  PE suspected, history of sickle cell disease EXAM: CT ANGIOGRAPHY CHEST WITH CONTRAST TECHNIQUE: Multidetector CT imaging of the chest was performed using the standard protocol during bolus administration of intravenous contrast. Multiplanar CT image reconstructions and MIPs were obtained to evaluate the vascular anatomy. RADIATION DOSE REDUCTION: This exam was  performed according to the departmental dose-optimization program which includes automated exposure control, adjustment of the mA and/or kV according to patient size and/or use of iterative reconstruction technique. CONTRAST:  50m OMNIPAQUE IOHEXOL 350 MG/ML SOLN COMPARISON:  None Available. FINDINGS: Cardiovascular: Right chest port catheter. Examination for pulmonary embolism is  substantially limited by breath motion artifact. Within this limitation, positive examination, with segmental to subsegmental embolus present in the bilateral lower lobes (series 6, image 161) and left upper lobe (series 6, image 106). Global cardiomegaly. RV LV ratio is borderline, 0.96. No pericardial effusion. Mediastinum/Nodes: No enlarged mediastinal, hilar, or axillary lymph nodes. Thymic remnant in the anterior mediastinum. Thyroid gland, trachea, and esophagus demonstrate no significant findings. Lungs/Pleura: Heterogeneous and ground-glass airspace opacity in the bilateral lung bases with trace left pleural effusion (series 7, image 89). Upper Abdomen: No acute abnormality. Musculoskeletal: No chest wall abnormality. No acute osseous findings. Review of the MIP images confirms the above findings. IMPRESSION: 1. Examination for pulmonary embolism is substantially limited by breath motion artifact. Within this limitation, positive examination, with segmental to subsegmental embolus present in the bilateral lower lobes and left upper lobe. 2. Mild global cardiomegaly.  RV LV ratio is borderline at 0.96. 3. Heterogeneous and ground-glass airspace opacity in the bilateral lung bases with trace left pleural effusion, generally in keeping with clinical concern for acute chest in the setting of sickle cell disease. These results were called by telephone at the time of interpretation on 04/14/2022 at 9:28 am to Dr. ALacretia Leigh, who verbally acknowledged these results. Electronically Signed   By: ADelanna AhmadiM.D.   On: 04/14/2022 09:30   DG Chest 2 View  Result Date: 04/14/2022 CLINICAL DATA:  Sickle cell crisis.  Dyspnea. EXAM: CHEST - 2 VIEW COMPARISON:  08/06/2021 FINDINGS: Streaky opacities are seen in both lung bases, new in the interval. No pulmonary edema or substantial pleural effusion. The cardio pericardial silhouette is enlarged. Right Port-A-Cath again noted. Telemetry leads overlie the chest.  IMPRESSION: New streaky opacities in both lung bases. In the appropriate clinical setting, findings could be compatible with acute chest syndrome in this patient with a history of sickle cell disease. Electronically Signed   By: EMisty StanleyM.D.   On: 04/14/2022 07:37     History of present illness:  JWillodean Levenis a 26year old female with a medical history significant for sickle cell disease, chronic pain syndrome, opiate dependence and tolerance, history of anemia of chronic disease, and history of polysubstance abuse presents with complaints of generalized pain . Pain is consistent with typical sickle cell pain crisis. Patient has not identified any inciting factors concerning crisis. Pain intensity is 10/10, constant, and throbbing. Patient last had Oxycodone and Xtampza without sustained relief. Patient denies fever, chills, chest pain, or shortness of breath. No urinary symptoms, nausea, vomiting, or diarrhea. No sick contacts, recent travel, or known exposure to COVID-19.   [facility] Course: Patient admitted to sickle cell day infusion clinic for management of pain crisis.  Reviewed laboratory values, consistent with baseline.  Pain managed with Dilaudid PCA Toradol 15 mg IV x 1 Tylenol 1000 mg x 1  IV fluids, 0.45% saline at 100 ml/hr Pain intensity decrease throughout admission and patient will be discharged home in a hemodynamically stable condition.   Discharge instructions:  Resume all home medications.   Follow up with PCP as previously  scheduled.   Discussed the importance of drinking 64 ounces of water daily, dehydration  of red blood cells may lead further sickling.   Avoid all stressors that precipitate sickle cell pain crisis.     The patient was given clear instructions to go to ER or return to medical center if symptoms do not improve, worsen or new problems develop.   Physical Exam at Discharge:  BP (!) 97/57 (BP Location: Left Arm)   Pulse  66   Temp 98.1 F (36.7 C) (Temporal)   Resp 16   SpO2 99%   Physical Exam Constitutional:      Appearance: Normal appearance.  Eyes:     Pupils: Pupils are equal, round, and reactive to light.  Cardiovascular:     Rate and Rhythm: Normal rate and regular rhythm.     Pulses: Normal pulses.  Pulmonary:     Effort: Pulmonary effort is normal.  Abdominal:     General: Bowel sounds are normal.  Musculoskeletal:        General: Normal range of motion.  Skin:    General: Skin is warm.  Neurological:     General: No focal deficit present.     Mental Status: She is alert and oriented to person, place, and time. Mental status is at baseline.  Psychiatric:        Mood and Affect: Mood normal.        Behavior: Behavior normal.        Thought Content: Thought content normal.        Judgment: Judgment normal.      Disposition at Discharge: Discharge disposition: 01-Home or Self Care       Discharge Orders:   Condition at Discharge:   Stable  Time spent on Discharge:  Greater than 30 minutes.  Signed: [name]  APRN, MSN, FNP-C [facility] [address] [place], [place] [address] [phone]  05/07/2022, 4:59 PM  Evaluation and management procedures were performed by the Advanced Practitioner under my supervision and collaboration. I have reviewed the Advanced Practitioner's note and chart, and I agree with the management and plan.   Angelica Chessman, MD, MHA, [place], [place], [place], [place] 05/07/2022, 7:36 PM

## 2022-05-08 ENCOUNTER — Encounter: Payer: Medicaid Other | Admitting: Clinical

## 2022-05-18 ENCOUNTER — Other Ambulatory Visit: Payer: Self-pay | Admitting: Family Medicine

## 2022-05-18 DIAGNOSIS — D571 Sickle-cell disease without crisis: Secondary | ICD-10-CM

## 2022-05-18 MED ORDER — OXYCODONE HCL 15 MG PO TABS: 15.0000 mg | ORAL_TABLET | Freq: Four times a day (QID) | ORAL | 0 refills | Status: DC | PRN

## 2022-05-18 NOTE — Progress Notes (Signed)
Reviewed PDMP substance reporting system prior to prescribing opiate medications. No inconsistencies noted.  Meds ordered this encounter  Medications   oxyCODONE (ROXICODONE) 15 MG immediate release tablet    Sig: Take 1 tablet (15 mg total) by mouth every 6 (six) hours as needed for pain.    Dispense:  60 tablet    Refill:  0    Order Specific Question:   Supervising Provider    Answer:   [name], [name]   Lashawn Orrego Moore Lyzbeth Genrich  APRN, MSN, FNP-C [facility] [address]  [place], [place] [address] [phone]  

## 2022-05-19 ENCOUNTER — Telehealth (HOSPITAL_COMMUNITY): Payer: Self-pay | Admitting: *Deleted

## 2022-05-19 ENCOUNTER — Non-Acute Institutional Stay (HOSPITAL_COMMUNITY)
Admission: AD | Admit: 2022-05-19 | Discharge: 2022-05-19 | Disposition: A | Discharge status: Home / Self Care | Payer: Medicaid Other | Source: Ambulatory Visit | Attending: Internal Medicine | Admitting: Internal Medicine

## 2022-05-19 DIAGNOSIS — D638 Anemia in other chronic diseases classified elsewhere: Secondary | ICD-10-CM | POA: Diagnosis not present

## 2022-05-19 DIAGNOSIS — D5700 Hb-SS disease with crisis, unspecified: Secondary | ICD-10-CM | POA: Diagnosis present

## 2022-05-19 DIAGNOSIS — F1120 Opioid dependence, uncomplicated: Secondary | ICD-10-CM | POA: Insufficient documentation

## 2022-05-19 DIAGNOSIS — G894 Chronic pain syndrome: Secondary | ICD-10-CM | POA: Insufficient documentation

## 2022-05-19 LAB — RETICULOCYTES
Immature Retic Fract: 35.6 % — ABNORMAL HIGH (ref 2.3–15.9)
RBC.: 2.11 MIL/uL — ABNORMAL LOW (ref 3.87–5.11)
Retic Count, Absolute: 212.3 10*3/uL — ABNORMAL HIGH (ref 19.0–186.0)
Retic Ct Pct: 10.1 % — ABNORMAL HIGH (ref 0.4–3.1)

## 2022-05-19 LAB — CBC WITH DIFFERENTIAL/PLATELET
Abs Immature Granulocytes: 0.02 10*3/uL (ref 0.00–0.07)
Basophils Absolute: 0.1 10*3/uL (ref 0.0–0.1)
Basophils Relative: 1 %
Eosinophils Absolute: 0 10*3/uL (ref 0.0–0.5)
Eosinophils Relative: 1 %
HCT: 21.7 % — ABNORMAL LOW (ref 36.0–46.0)
Hemoglobin: 7.3 g/dL — ABNORMAL LOW (ref 12.0–15.0)
Immature Granulocytes: 0 %
Lymphocytes Relative: 33 %
Lymphs Abs: 2.3 10*3/uL (ref 0.7–4.0)
MCH: 33.8 pg (ref 26.0–34.0)
MCHC: 33.6 g/dL (ref 30.0–36.0)
MCV: 100.5 fL — ABNORMAL HIGH (ref 80.0–100.0)
Monocytes Absolute: 1.1 10*3/uL — ABNORMAL HIGH (ref 0.1–1.0)
Monocytes Relative: 15 %
Neutro Abs: 3.6 10*3/uL (ref 1.7–7.7)
Neutrophils Relative %: 50 %
Platelets: 343 10*3/uL (ref 150–400)
RBC: 2.16 MIL/uL — ABNORMAL LOW (ref 3.87–5.11)
RDW: 19.8 % — ABNORMAL HIGH (ref 11.5–15.5)
WBC: 7.1 10*3/uL (ref 4.0–10.5)
nRBC: 0.7 % — ABNORMAL HIGH (ref 0.0–0.2)

## 2022-05-19 LAB — COMPREHENSIVE METABOLIC PANEL
ALT: 26 U/L (ref 0–44)
AST: 34 U/L (ref 15–41)
Albumin: 4.6 g/dL (ref 3.5–5.0)
Alkaline Phosphatase: 93 U/L (ref 38–126)
Anion gap: 6 (ref 5–15)
BUN: 9 mg/dL (ref 6–20)
CO2: 24 mmol/L (ref 22–32)
Calcium: 9.3 mg/dL (ref 8.9–10.3)
Chloride: 108 mmol/L (ref 98–111)
Creatinine, Ser: 0.46 mg/dL (ref 0.44–1.00)
GFR, Estimated: >60 mL/min (ref >60)
Glucose, Bld: 90 mg/dL (ref 70–99)
Potassium: 3.7 mmol/L (ref 3.5–5.1)
Sodium: 138 mmol/L (ref 135–145)
Total Bilirubin: 1.4 mg/dL — ABNORMAL HIGH (ref 0.3–1.2)
Total Protein: 7.4 g/dL (ref 6.5–8.1)

## 2022-05-19 MED ORDER — KETOROLAC TROMETHAMINE 30 MG/ML IJ SOLN
15.0000 mg | Freq: Once | INTRAMUSCULAR | Status: AC
  Administered 2022-05-19: 15 mg via INTRAVENOUS
  Filled 2022-05-19: qty 1

## 2022-05-19 MED ORDER — ONDANSETRON HCL 4 MG/2ML IJ SOLN
4.0000 mg | Freq: Four times a day (QID) | INTRAMUSCULAR | Status: DC | PRN
  Administered 2022-05-19: 4 mg via INTRAVENOUS
  Filled 2022-05-19: qty 2

## 2022-05-19 MED ORDER — NALOXONE HCL 0.4 MG/ML IJ SOLN: 0.4000 mg | INTRAMUSCULAR | Status: DC | PRN

## 2022-05-19 MED ORDER — DIPHENHYDRAMINE HCL 25 MG PO CAPS
25.0000 mg | ORAL_CAPSULE | ORAL | Status: DC | PRN
  Administered 2022-05-19: 25 mg via ORAL
  Filled 2022-05-19: qty 1

## 2022-05-19 MED ORDER — HYDROMORPHONE 1 MG/ML IV SOLN
INTRAVENOUS | Status: DC
  Administered 2022-05-19: 13.5 mg via INTRAVENOUS
  Filled 2022-05-19: qty 30

## 2022-05-19 MED ORDER — HEPARIN SOD (PORK) LOCK FLUSH 100 UNIT/ML IV SOLN
500.0000 [IU] | INTRAVENOUS | Status: AC | PRN
  Administered 2022-05-19: 500 [IU]
  Filled 2022-05-19: qty 5

## 2022-05-19 MED ORDER — SODIUM CHLORIDE 0.9% FLUSH: 9.0000 mL | INTRAVENOUS | Status: DC | PRN

## 2022-05-19 MED ORDER — ACETAMINOPHEN 500 MG PO TABS
1000.0000 mg | ORAL_TABLET | Freq: Once | ORAL | Status: AC
  Administered 2022-05-19: 1000 mg via ORAL
  Filled 2022-05-19: qty 2

## 2022-05-19 MED ORDER — SODIUM CHLORIDE 0.45 % IV SOLN: INTRAVENOUS | Status: DC

## 2022-05-19 MED ORDER — SODIUM CHLORIDE 0.9% FLUSH
10.0000 mL | INTRAVENOUS | Status: AC | PRN
  Administered 2022-05-19: 10 mL
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
## 2022-05-19 NOTE — Progress Notes (Signed)
Pt admitted to day hospital for treatment of sickle cell pain crisis. Pt reported 8/10 pain to bilateral legs and hips. Pt received Dilaudid PCA, IV Toradol, IV Zofran, and hydrated with IV fluids via PAC. Pt received PO Tylenol 1,000 mg and PO Benadryl 25 mg. At discharge pt reported pain 6/10. At discharge, pt oriented, ambulatory, and alert.

## 2022-05-19 NOTE — Telephone Encounter (Signed)
Patient called requesting to come to the day hospital for sickle cell pain. Patient reports bilateral leg and hip pain rated 8/10. Reports taking Xtampza at 7:00 am. Patient is out of Oxycodone but can fill her prescription tomorrow. COVID-19 screening done and patient denies all symptoms and exposures. Admits to come abdominal pain due to her menstrual cycle but denies fever, chest pain, nausea, vomiting and diarrhea. Admits to having transportation without driving herself home. Per patient, she will drive to the day hospital but will get pick up by someone else at discharge. [place], [place] notified.  Patient can come to the day hospital for pain management. Patient advised and expresses an understanding.

## 2022-05-19 NOTE — BH Specialist Note (Signed)
Integrated Behavioral Health General Follow Up Note  05/19/2022 Name: Sandra Mckay MRN: [id number] DOB: 12/31/1995 Sandra Mckay is a 26 y.o. year old female who sees Dorena Dew, FNP for primary care. LCSW was initially consulted to assist the patient with Mental Health Counseling and Resources.  Interpreter: No.   Interpreter Name & Language: none  Assessment: Patient experiencing bipolar disorder and anxiety.  Ongoing Intervention: [name] met with patient at bedside in the day hospital. Reviewed intake paperwork for [facility] ([place]). Patient completed paperwork and [name] submitted to [facility].  Review of patient status, including review of consultants reports, relevant laboratory and other test results, and collaboration with appropriate care team members and the patient's provider was performed as part of comprehensive patient evaluation and provision of services.    [name], [facility] [phone]

## 2022-05-19 NOTE — Discharge Summary (Signed)
[facility] Discharge Summary   Patient ID: Sandra Mckay MRN: [id number] DOB/AGE: [birth year] 28 y.o.  Admit date: 05/19/2022 Discharge date: 05/19/2022  Primary Care Physician:  Dorena Dew, FNP  Admission Diagnoses:  Active Problems:   Sickle cell pain crisis [facility])  Discharge Medications:  Allergies as of 05/19/2022       Reactions   Latex Rash   Wound Dressing Adhesive Rash        Medication List     TAKE these medications    acetaminophen 500 MG tablet Commonly known as: TYLENOL Take 1,000 mg by mouth as needed (pain).   albuterol 108 (90 Base) MCG/ACT inhaler Commonly known as: VENTOLIN HFA Inhale 3-4 puffs into the lungs 2 (two) times daily as needed for wheezing or shortness of breath.   apixaban 5 MG Tabs tablet Commonly known as: ELIQUIS Take 2 tablets (10 mg total) by mouth 2 (two) times daily for 4 days.   apixaban 5 MG Tabs tablet Commonly known as: ELIQUIS Take 1 tablet (5 mg total) by mouth 2 (two) times daily.   folic acid 1 MG tablet Commonly known as: FOLVITE Take 1 tablet (1 mg total) by mouth daily.   gabapentin 100 MG capsule Commonly known as: NEURONTIN Take 3 capsules (300 mg total) by mouth 3 (three) times daily. What changed:  how much to take when to take this   hydroxyurea 500 MG capsule Commonly known as: HYDREA Take 3 capsules (1,500 mg total) by mouth daily. TAKE 3 CAPSULES (1,500 MG TOTAL) BY MOUTH DAILY.   oxyCODONE 15 MG immediate release tablet Commonly known as: ROXICODONE Take 1 tablet (15 mg total) by mouth every 6 (six) hours as needed for pain.   QUEtiapine 25 MG tablet Commonly known as: SEROquel Take 1 tablet (25 mg total) by mouth at bedtime. What changed: when to take this   tiZANidine 4 MG tablet Commonly known as: ZANAFLEX Take 1 tablet (4 mg total) by mouth 3 (three) times daily. What changed:  when to take this reasons to take this   Vitamin D (Ergocalciferol) 1.25 MG  (50000 UNIT) Caps capsule Commonly known as: DRISDOL Take 1 capsule (50,000 Units total) by mouth every 7 (seven) days for 8 doses.         Consults:  None  Significant Diagnostic Studies:  No results found.  History of present illness:  Sandra Mckay is a 26 year old female with a medical history significant for sickle cell disease, chronic pain syndrome, opiate dependence and tolerance, and history of anemia of chronic disease that presents with complaints of upper and lower extremity pain.  Pain has been present for the last 3 days and has been unrelieved by her home medications.  Patient attributes pain crisis to changes in weather.  She last had Xtampza and oxycodone this a.m. without sustained relief.  She rates her pain as 9/10, constant, and throbbing.  She denies any subjective fever, chills, or rigors.  No chest pain, shortness of breath, urinary symptoms, nausea, vomiting, or diarrhea.  No sick contacts, recent travel, or exposure to COVID-19.  [facility] Course: Patient admitted to sickle cell day infusion clinic for management of sickle cell pain crisis.  Reviewed all laboratory values, patient CBC notable for hemoglobin of 7.3.  It appears to be consistent with her baseline.  She has been is typically not transfused unless hemoglobin is less than 6.  We will repeat labs during primary care appointment. CMP shows total bilirubin  1.4, otherwise unremarkable. Pain managed with IV Dilaudid PCA Toradol 15 mg IV x1 Tylenol 1000 mg x 1 IV fluids, 0.45% saline at 100 mL/h Patient's pain decreased throughout admission and she is requesting discharge home. Solymar is alert, oriented, and ambulating without assistance.  She will discharge home in a hemodynamically stable condition.  Patient is aware of all upcoming appointments.  Physical Exam at Discharge:  BP (!) 96/55 (BP Location: Right Arm)   Pulse 68   Temp 98.1 F (36.7 C) (Temporal)   Resp 16   LMP  05/19/2022   SpO2 95%  Physical Exam Constitutional:      Appearance: Normal appearance.  Eyes:     Pupils: Pupils are equal, round, and reactive to light.  Cardiovascular:     Rate and Rhythm: Normal rate and regular rhythm.     Pulses: Normal pulses.  Pulmonary:     Effort: Pulmonary effort is normal.  Abdominal:     General: Bowel sounds are normal.  Musculoskeletal:        General: Normal range of motion.  Skin:    General: Skin is warm.  Neurological:     General: No focal deficit present.     Mental Status: She is alert. Mental status is at baseline.  Psychiatric:        Mood and Affect: Mood normal.        Thought Content: Thought content normal.        Judgment: Judgment normal.      Disposition at Discharge: Discharge disposition: 01-Home or Self Care       Discharge Orders: Discharge Instructions     Discharge patient   Complete by: As directed    Discharge disposition: 01-Home or Self Care   Discharge patient date: 05/19/2022       Condition at Discharge:   Stable  Time spent on Discharge:  Greater than 30 minutes.  Signed: [name]  APRN, MSN, FNP-C [facility] [address] [place], [place] [address] [phone]  05/19/2022, 3:47 PM

## 2022-05-21 ENCOUNTER — Ambulatory Visit (INDEPENDENT_AMBULATORY_CARE_PROVIDER_SITE_OTHER): Payer: Medicaid Other | Admitting: Clinical

## 2022-05-21 DIAGNOSIS — F419 Anxiety disorder, unspecified: Secondary | ICD-10-CM

## 2022-05-21 DIAGNOSIS — F319 Bipolar disorder, unspecified: Secondary | ICD-10-CM | POA: Diagnosis not present
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
## 2022-05-26 ENCOUNTER — Emergency Department (HOSPITAL_COMMUNITY): Payer: Medicaid Other

## 2022-05-26 ENCOUNTER — Inpatient Hospital Stay (HOSPITAL_COMMUNITY)
Admission: EM | Admit: 2022-05-26 | Discharge: 2022-05-31 | DRG: 299 | Disposition: A | Discharge status: Home / Self Care | Payer: Medicaid Other | Attending: Internal Medicine | Admitting: Internal Medicine

## 2022-05-26 ENCOUNTER — Other Ambulatory Visit: Payer: Self-pay

## 2022-05-26 ENCOUNTER — Encounter (HOSPITAL_COMMUNITY): Payer: Self-pay

## 2022-05-26 DIAGNOSIS — I82C11 Acute embolism and thrombosis of right internal jugular vein: Secondary | ICD-10-CM | POA: Diagnosis present

## 2022-05-26 DIAGNOSIS — Z823 Family history of stroke: Secondary | ICD-10-CM | POA: Diagnosis not present

## 2022-05-26 DIAGNOSIS — Z86718 Personal history of other venous thrombosis and embolism: Secondary | ICD-10-CM | POA: Diagnosis not present

## 2022-05-26 DIAGNOSIS — D649 Anemia, unspecified: Secondary | ICD-10-CM | POA: Diagnosis present

## 2022-05-26 DIAGNOSIS — Z91148 Patient's other noncompliance with medication regimen for other reason: Secondary | ICD-10-CM

## 2022-05-26 DIAGNOSIS — I272 Pulmonary hypertension, unspecified: Secondary | ICD-10-CM | POA: Diagnosis present

## 2022-05-26 DIAGNOSIS — Z8249 Family history of ischemic heart disease and other diseases of the circulatory system: Secondary | ICD-10-CM

## 2022-05-26 DIAGNOSIS — G894 Chronic pain syndrome: Secondary | ICD-10-CM | POA: Diagnosis present

## 2022-05-26 DIAGNOSIS — Z833 Family history of diabetes mellitus: Secondary | ICD-10-CM | POA: Diagnosis not present

## 2022-05-26 DIAGNOSIS — Z808 Family history of malignant neoplasm of other organs or systems: Secondary | ICD-10-CM | POA: Diagnosis not present

## 2022-05-26 DIAGNOSIS — E559 Vitamin D deficiency, unspecified: Secondary | ICD-10-CM | POA: Diagnosis present

## 2022-05-26 DIAGNOSIS — F1010 Alcohol abuse, uncomplicated: Secondary | ICD-10-CM

## 2022-05-26 DIAGNOSIS — I8290 Acute embolism and thrombosis of unspecified vein: Secondary | ICD-10-CM | POA: Diagnosis not present

## 2022-05-26 DIAGNOSIS — F17210 Nicotine dependence, cigarettes, uncomplicated: Secondary | ICD-10-CM | POA: Diagnosis present

## 2022-05-26 DIAGNOSIS — D5700 Hb-SS disease with crisis, unspecified: Secondary | ICD-10-CM | POA: Diagnosis present

## 2022-05-26 DIAGNOSIS — Z9081 Acquired absence of spleen: Secondary | ICD-10-CM

## 2022-05-26 DIAGNOSIS — D638 Anemia in other chronic diseases classified elsewhere: Secondary | ICD-10-CM | POA: Diagnosis present

## 2022-05-26 DIAGNOSIS — Z7901 Long term (current) use of anticoagulants: Secondary | ICD-10-CM

## 2022-05-26 DIAGNOSIS — Z9049 Acquired absence of other specified parts of digestive tract: Secondary | ICD-10-CM

## 2022-05-26 DIAGNOSIS — D571 Sickle-cell disease without crisis: Secondary | ICD-10-CM | POA: Diagnosis not present

## 2022-05-26 DIAGNOSIS — M542 Cervicalgia: Secondary | ICD-10-CM | POA: Diagnosis present

## 2022-05-26 DIAGNOSIS — Z720 Tobacco use: Secondary | ICD-10-CM | POA: Diagnosis not present

## 2022-05-26 DIAGNOSIS — F319 Bipolar disorder, unspecified: Secondary | ICD-10-CM | POA: Diagnosis present

## 2022-05-26 DIAGNOSIS — Z86711 Personal history of pulmonary embolism: Secondary | ICD-10-CM | POA: Diagnosis not present

## 2022-05-26 DIAGNOSIS — G8929 Other chronic pain: Secondary | ICD-10-CM | POA: Diagnosis present

## 2022-05-26 LAB — CBC WITH DIFFERENTIAL/PLATELET
Abs Immature Granulocytes: 0.03 10*3/uL (ref 0.00–0.07)
Basophils Absolute: 0 10*3/uL (ref 0.0–0.1)
Basophils Relative: 1 %
Eosinophils Absolute: 0.1 10*3/uL (ref 0.0–0.5)
Eosinophils Relative: 1 %
HCT: 23.8 % — ABNORMAL LOW (ref 36.0–46.0)
Hemoglobin: 8.3 g/dL — ABNORMAL LOW (ref 12.0–15.0)
Immature Granulocytes: 0 %
Lymphocytes Relative: 27 %
Lymphs Abs: 2.3 10*3/uL (ref 0.7–4.0)
MCH: 36.4 pg — ABNORMAL HIGH (ref 26.0–34.0)
MCHC: 34.9 g/dL (ref 30.0–36.0)
MCV: 104.4 fL — ABNORMAL HIGH (ref 80.0–100.0)
Monocytes Absolute: 1.1 10*3/uL — ABNORMAL HIGH (ref 0.1–1.0)
Monocytes Relative: 13 %
Neutro Abs: 5.1 10*3/uL (ref 1.7–7.7)
Neutrophils Relative %: 58 %
Platelets: 369 10*3/uL (ref 150–400)
RBC: 2.28 MIL/uL — ABNORMAL LOW (ref 3.87–5.11)
RDW: 21.5 % — ABNORMAL HIGH (ref 11.5–15.5)
WBC: 8.7 10*3/uL (ref 4.0–10.5)
nRBC: 0.7 % — ABNORMAL HIGH (ref 0.0–0.2)

## 2022-05-26 LAB — BASIC METABOLIC PANEL
Anion gap: 6 (ref 5–15)
BUN: 6 mg/dL (ref 6–20)
CO2: 24 mmol/L (ref 22–32)
Calcium: 8.9 mg/dL (ref 8.9–10.3)
Chloride: 105 mmol/L (ref 98–111)
Creatinine, Ser: 0.43 mg/dL — ABNORMAL LOW (ref 0.44–1.00)
GFR, Estimated: >60 mL/min (ref >60)
Glucose, Bld: 99 mg/dL (ref 70–99)
Potassium: 3.8 mmol/L (ref 3.5–5.1)
Sodium: 135 mmol/L (ref 135–145)

## 2022-05-26 LAB — HEPARIN LEVEL (UNFRACTIONATED): Heparin Unfractionated: <0.1 IU/mL — ABNORMAL LOW (ref 0.30–0.70)

## 2022-05-26 LAB — I-STAT BETA HCG BLOOD, ED (MC, WL, AP ONLY): I-stat hCG, quantitative: <5 m[IU]/mL (ref <5)

## 2022-05-26 LAB — PROTIME-INR
INR: 1.1 (ref 0.8–1.2)
Prothrombin Time: 14.5 seconds (ref 11.4–15.2)

## 2022-05-26 LAB — RETICULOCYTES
Immature Retic Fract: 24.1 % — ABNORMAL HIGH (ref 2.3–15.9)
RBC.: 2.37 MIL/uL — ABNORMAL LOW (ref 3.87–5.11)
Retic Count, Absolute: 447 10*3/uL — ABNORMAL HIGH (ref 19.0–186.0)
Retic Ct Pct: 18.9 % — ABNORMAL HIGH (ref 0.4–3.1)

## 2022-05-26 LAB — APTT: aPTT: 35 seconds (ref 24–36)

## 2022-05-26 MED ORDER — HYDROMORPHONE HCL 2 MG/ML IJ SOLN
2.0000 mg | INTRAMUSCULAR | Status: AC
  Administered 2022-05-26: 2 mg via INTRAVENOUS
  Filled 2022-05-26: qty 1

## 2022-05-26 MED ORDER — FOLIC ACID 1 MG PO TABS
1.0000 mg | ORAL_TABLET | Freq: Every day | ORAL | Status: DC
  Administered 2022-05-27 – 2022-05-30 (×4): 1 mg via ORAL
  Filled 2022-05-26 (×5): qty 1

## 2022-05-26 MED ORDER — HYDROXYUREA 500 MG PO CAPS
1500.0000 mg | ORAL_CAPSULE | Freq: Every day | ORAL | Status: DC
  Administered 2022-05-27 – 2022-05-30 (×4): 1500 mg via ORAL
  Filled 2022-05-26 (×6): qty 3

## 2022-05-26 MED ORDER — DIPHENHYDRAMINE HCL 25 MG PO CAPS
25.0000 mg | ORAL_CAPSULE | ORAL | Status: DC | PRN
  Administered 2022-05-27 – 2022-05-31 (×6): 25 mg via ORAL
  Filled 2022-05-26 (×6): qty 1

## 2022-05-26 MED ORDER — SODIUM CHLORIDE 0.9 % IV SOLN
12.5000 mg | Freq: Once | INTRAVENOUS | Status: AC
  Administered 2022-05-26: 12.5 mg via INTRAVENOUS
  Filled 2022-05-26: qty 12.5

## 2022-05-26 MED ORDER — HYDROMORPHONE 1 MG/ML IV SOLN
INTRAVENOUS | Status: DC
  Administered 2022-05-27: 4 mg via INTRAVENOUS
  Administered 2022-05-28: 6.5 mg via INTRAVENOUS
  Administered 2022-05-28: 2 mg via INTRAVENOUS
  Administered 2022-05-28: 15 mg via INTRAVENOUS
  Administered 2022-05-28: 6.5 mg via INTRAVENOUS
  Administered 2022-05-28: 9 mg via INTRAVENOUS
  Administered 2022-05-29: 7 mg via INTRAVENOUS
  Administered 2022-05-29: 4 mg via INTRAVENOUS
  Administered 2022-05-29: 1 mg via INTRAVENOUS
  Administered 2022-05-29: 2 mg via INTRAVENOUS
  Administered 2022-05-29: 6 mg via INTRAVENOUS
  Administered 2022-05-29: 8 mg via INTRAVENOUS
  Administered 2022-05-30: 5.5 mg via INTRAVENOUS
  Administered 2022-05-30: 8 mg via INTRAVENOUS
  Filled 2022-05-26 (×4): qty 30

## 2022-05-26 MED ORDER — SENNOSIDES-DOCUSATE SODIUM 8.6-50 MG PO TABS
1.0000 | ORAL_TABLET | Freq: Every day | ORAL | Status: DC
  Administered 2022-05-27 – 2022-05-30 (×5): 1 via ORAL
  Filled 2022-05-26 (×5): qty 1

## 2022-05-26 MED ORDER — HEPARIN (PORCINE) 25000 UT/250ML-% IV SOLN
1350.0000 [IU]/h | INTRAVENOUS | Status: AC
  Administered 2022-05-26: 1100 [IU]/h via INTRAVENOUS
  Administered 2022-05-27 – 2022-05-28 (×3): 1250 [IU]/h via INTRAVENOUS
  Filled 2022-05-26 (×3): qty 250

## 2022-05-26 MED ORDER — HEPARIN BOLUS VIA INFUSION
1500.0000 [IU] | Freq: Once | INTRAVENOUS | Status: AC
  Administered 2022-05-26: 1500 [IU] via INTRAVENOUS
  Filled 2022-05-26: qty 1500

## 2022-05-26 MED ORDER — NALOXONE HCL 0.4 MG/ML IJ SOLN: 0.4000 mg | INTRAMUSCULAR | Status: DC | PRN

## 2022-05-26 MED ORDER — IOHEXOL 300 MG/ML  SOLN
100.0000 mL | Freq: Once | INTRAMUSCULAR | Status: AC | PRN
  Administered 2022-05-26: 80 mL via INTRAVENOUS

## 2022-05-26 MED ORDER — HYDROMORPHONE HCL 1 MG/ML IJ SOLN
1.0000 mg | Freq: Once | INTRAMUSCULAR | Status: AC
  Administered 2022-05-26: 1 mg via INTRAMUSCULAR
  Filled 2022-05-26: qty 1

## 2022-05-26 MED ORDER — ONDANSETRON HCL 4 MG/2ML IJ SOLN
4.0000 mg | INTRAMUSCULAR | Status: DC | PRN
  Administered 2022-05-26 – 2022-05-27 (×2): 4 mg via INTRAVENOUS
  Filled 2022-05-26 (×3): qty 2

## 2022-05-26 MED ORDER — ACETAMINOPHEN 325 MG PO TABS
650.0000 mg | ORAL_TABLET | Freq: Four times a day (QID) | ORAL | Status: DC | PRN
  Administered 2022-05-27: 650 mg via ORAL
  Filled 2022-05-26: qty 2

## 2022-05-26 MED ORDER — HYDROMORPHONE HCL 2 MG/ML IJ SOLN
2.0000 mg | INTRAMUSCULAR | Status: DC
  Administered 2022-05-27 (×3): 2 mg via INTRAVENOUS
  Filled 2022-05-26 (×3): qty 1

## 2022-05-26 MED ORDER — POLYETHYLENE GLYCOL 3350 17 G PO PACK: 17.0000 g | PACK | Freq: Every day | ORAL | Status: DC | PRN

## 2022-05-26 MED ORDER — MELATONIN 5 MG PO TABS: 5.0000 mg | ORAL_TABLET | Freq: Every evening | ORAL | Status: DC | PRN

## 2022-05-26 MED ORDER — SODIUM CHLORIDE 0.9% FLUSH: 9.0000 mL | INTRAVENOUS | Status: DC | PRN

## 2022-05-26 MED ORDER — QUETIAPINE FUMARATE 25 MG PO TABS
25.0000 mg | ORAL_TABLET | Freq: Every day | ORAL | Status: DC
  Administered 2022-05-27 – 2022-05-30 (×5): 25 mg via ORAL
  Filled 2022-05-26 (×5): qty 1

## 2022-05-26 MED ORDER — ALBUTEROL SULFATE HFA 108 (90 BASE) MCG/ACT IN AERS: 3.0000 | INHALATION_SPRAY | Freq: Two times a day (BID) | RESPIRATORY_TRACT | Status: DC | PRN

## 2022-05-26 MED ORDER — SODIUM CHLORIDE 0.45 % IV SOLN: INTRAVENOUS | Status: AC

## 2022-05-26 NOTE — ED Triage Notes (Signed)
Sickle cell pain in bilateral legs and hips x 3 days.  Patient has a right chest port and has swelling of he right collar bone area and right lateral neck x 3 days. Patient states it is hard to turn her neck and raise her voice.

## 2022-05-26 NOTE — Progress Notes (Signed)
ANTICOAGULATION CONSULT NOTE - Initial Consult  Pharmacy Consult for Heparin Indication: RIJ thrombosis  Allergies  Allergen Reactions   Latex Rash   Wound Dressing Adhesive Rash    Patient Measurements: Height: '5\' 2"'$  (157.5 cm) Weight: 59 kg (130 lb) IBW/kg (Calculated) : 50.1 Heparin Dosing Weight: TBW  Vital Signs: Temp: 98.8 F (37.1 C) (10/24 2023) Temp Source: Oral (10/24 1803) BP: 100/62 (10/24 2115) Pulse Rate: 74 (10/24 2115)  Labs: Recent Labs    05/26/22 1931  HGB 8.3*  HCT 23.8*  PLT 369  APTT 35  CREATININE 0.43*    Estimated Creatinine Clearance: 82.8 mL/min (A) (by C-G formula based on SCr of 0.43 mg/dL (L)).   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  PMH significant for sickle cell disease, anemia of chronic illness, and opioid dependency, PE in Sept 2023 for which she was started on apixaban. Patient states last apixaban dose 10/23 PM however heparin level <0.1 so patient likely non-compliant with medication. Baseline labs: CBC: Hg low/stable at patient's baseline, Pltc WNL, aptt/heparin level at baseline  Goal of Therapy:  Heparin level 0.3-0.7 units/ml Monitor platelets by anticoagulation protocol: Yes   Plan:  Heparin 1500 units IV bolus x1 then initiate heparin infusion at 1100 units/hr.   Check heparin level in 6hr Daily heparin level & CBC while on heparin F/U long-term anticoagulation  plans  Netta Cedars PharmD 05/26/2022,10:07 PM

## 2022-05-26 NOTE — H&P (Incomplete)
History and Physical  Sandra Mckay YSA:[id number] DOB: [birth year] DOA: 05/26/2022  Referring physician: Dr. Melina Copa, [place]  PCP: Dorena Dew, FNP  Outpatient Specialists: [facility]. Patient coming from: Home  Chief Complaint: Sickle cell pain crisis   HPI: Sandra Mckay is a 26 y.o. female with medical history significant for   ED Course: ***  Review of Systems: Review of systems as noted in the HPI. All other systems reviewed and are negative.   Past Medical History:  Diagnosis Date  . Acute cystitis without hematuria 10/21/2019  . Blood transfusion without reported diagnosis   . Bronchitis   . Chickenpox   . Depression   . Elevated ferritin level 05/2019  . Heart murmur   . Nausea without vomiting 09/09/2015  . Pulmonary hypertension ([place])   . Sickle cell anemia (H[facility])   . 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 Sickle cell pain crisis ([place]) 12/05/2016  . Thrombocytosis 05/2019  . Urinary tract infection   . Vitamin D deficiency    Past Surgical History:  Procedure Laterality Date  . CHOLECYSTECTOMY  2011  . EYE SURGERY     Sty removal  . IR IMAGING GUIDED PORT INSERTION  03/06/2022  . LABIAL ADHESION LYSIS  1999  . 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 TONSILLECTOMY  2012    Social History:  reports that she has been smoking cigarettes. She has been smoking an average of 1 pack per day. She has never used smokeless tobacco. She reports current alcohol use. She reports that she does not use drugs.   Allergies  Allergen Reactions  . Latex Rash  . Wound Dressing Adhesive Rash    Family History  Problem Relation Age of Onset  . Arthritis Other        grandparent  . Stroke Other   . Hypertension Other   . Diabetes Other        grandparent  . Cancer - Other Other        Glioblastoma    ***  Prior to Admission medications   Medication Sig Start Date End Date Taking? Authorizing Provider  albuterol  (VENTOLIN HFA) 108 (90 Base) MCG/ACT inhaler Inhale 3-4 puffs into the lungs 2 (two) times daily as needed for wheezing or shortness of breath.   Yes [provider]  apixaban (ELIQUIS) 5 MG TABS tablet Take 1 tablet (5 mg total) by mouth 2 (two) times daily. 04/23/22  Yes Tresa Garter, MD  oxyCODONE (ROXICODONE) 15 MG immediate release tablet Take 1 tablet (15 mg total) by mouth every 6 (six) hours as needed for pain. 05/19/22  Yes Dorena Dew, FNP  QUEtiapine (SEROQUEL) 25 MG tablet Take 1 tablet (25 mg total) by mouth at bedtime. Patient taking differently: Take 25 mg by mouth daily. 05/08/21 05/26/22 Yes King, Diona Foley, NP  Vitamin D, Ergocalciferol, (DRISDOL) 1.25 MG (50000 UNIT) CAPS capsule Take 1 capsule (50,000 Units total) by mouth every 7 (seven) days for 8 doses. 04/20/22 06/09/22 Yes Tresa Garter, MD  apixaban (ELIQUIS) 5 MG TABS tablet Take 2 tablets (10 mg total) by mouth 2 (two) times daily for 4 days. Patient not taking: Reported on 05/26/2022 04/20/22 05/26/22  Tresa Garter, MD  folic acid (FOLVITE) 1 MG tablet Take 1 tablet (1 mg total) by mouth daily. Patient not taking: Reported on 05/26/2022 04/20/22   Tresa Garter, MD  gabapentin (NEURONTIN) 100 MG  capsule Take 3 capsules (300 mg total) by mouth 3 (three) times daily. Patient taking differently: Take 100 mg by mouth in the morning, at noon, and at bedtime. 02/26/22   Dorena Dew, FNP  hydroxyurea (HYDREA) 500 MG capsule Take 3 capsules (1,500 mg total) by mouth daily. TAKE 3 CAPSULES (1,500 MG TOTAL) BY MOUTH DAILY. Patient taking differently: Take 1,500 mg by mouth daily. 04/20/22   Tresa Garter, MD  tiZANidine (ZANAFLEX) 4 MG tablet Take 1 tablet (4 mg total) by mouth 3 (three) times daily. Patient taking differently: Take 4 mg by mouth as needed for muscle spasms. 06/16/21 06/16/22  Vevelyn Francois, NP    Physical Exam: BP 100/62   Pulse 74   Temp 98.8 F (37.1 C)    Resp 16   Ht '5\' 2"'$  (1.575 m)   Wt 59 kg   LMP 05/19/2022   SpO2 98%   BMI 23.78 kg/m   General: 26 y.o. year-old female well developed well nourished in no acute distress.  Alert and oriented x3. Cardiovascular: Regular rate and rhythm with no rubs or gallops.  No thyromegaly or JVD noted.  No lower extremity edema. 2/4 pulses in all 4 extremities. Respiratory: Clear to auscultation with no wheezes or rales. Good inspiratory effort. Abdomen: Soft nontender nondistended with normal bowel sounds x4 quadrants. Muskuloskeletal: No cyanosis, clubbing or edema noted bilaterally Neuro: CN II-XII intact, strength, sensation, reflexes Skin: No ulcerative lesions noted or rashes Psychiatry: Judgement and insight appear normal. Mood is appropriate for condition and setting          Labs on Admission:  Basic Metabolic Panel: Recent Labs  Lab 05/26/22 1931  NA 135  K 3.8  CL 105  CO2 24  GLUCOSE 99  BUN 6  CREATININE 0.43*  CALCIUM 8.9   Liver Function Tests: No results for input(s): "AST", "ALT", "ALKPHOS", "BILITOT", "PROT", "ALBUMIN" in the last 168 hours. No results for input(s): "LIPASE", "AMYLASE" in the last 168 hours. No results for input(s): "AMMONIA" in the last 168 hours. CBC: Recent Labs  Lab 05/26/22 1931  WBC 8.7  NEUTROABS 5.1  HGB 8.3*  HCT 23.8*  MCV 104.4*  PLT 369   Cardiac Enzymes: No results for input(s): "CKTOTAL", "CKMB", "CKMBINDEX", "TROPONINI" in the last 168 hours.  BNP (last 3 results) No results for input(s): "BNP" in the last 8760 hours.  ProBNP (last 3 results) No results for input(s): "PROBNP" in the last 8760 hours.  CBG: No results for input(s): "GLUCAP" in the last 168 hours.  Radiological Exams on Admission: CT Chest W Contrast  Result Date: 05/26/2022 CLINICAL DATA:  Chest wall pain, infection or inflammation suspected. Right-sided chest pain with difficulty swallowing, and neck pain. Concern for chest port complication. Sickle  cell crisis. EXAM: CT CHEST WITH CONTRAST TECHNIQUE: Multidetector CT imaging of the chest was performed during intravenous contrast administration. RADIATION DOSE REDUCTION: This exam was performed according to the departmental dose-optimization program which includes automated exposure control, adjustment of the mA and/or kV according to patient size and/or use of iterative reconstruction technique. CONTRAST:  39m OMNIPAQUE IOHEXOL 300 MG/ML  SOLN COMPARISON:  04/14/2022. FINDINGS: Cardiovascular: The heart is mildly enlarged and there is no pericardial effusion. The aorta and and pulmonary trunk are normal in caliber. A right chest wall port is noted with looping in the region of the insertion site in the internal jugular vein with retraction. There is suggestion of edema at the level of insertion in the  internal jugular vein with mild mass effect upon the thyroid gland and trachea. The chest port terminates in the superior vena cava. Mediastinum/Nodes: No mediastinal, hilar, or axillary lymphadenopathy. The thyroid gland, trachea, and esophagus are within normal limits. Lungs/Pleura: Strandy opacities are present at the lung bases. No consolidation, effusion, or pneumothorax. Upper Abdomen: The gallbladder is surgically absent. No acute abnormality. Musculoskeletal: No acute or suspicious osseous abnormality. IMPRESSION: 1. Right chest port with interval looping in the region of the insertion in the internal jugular vein with retraction. Mild edema is noted adjacent to the catheter loop with mild mass effect on the trachea and thyroid gland. Chest port terminates in the superior vena cava. Please see CT neck for additional information. 2. Mild cardiomegaly. 3. Strandy atelectasis or infiltrate at the lung bases. Electronically Signed   By: Brett Fairy M.D.   On: 05/26/2022 21:57   CT Soft Tissue Neck W Contrast  Result Date: 05/26/2022 CLINICAL DATA:  Soft tissue swelling, infection suspected.  Right-sided neck pain. Difficulty swallowing. EXAM: CT NECK WITH CONTRAST TECHNIQUE: Multidetector CT imaging of the neck was performed using the standard protocol following the bolus administration of intravenous contrast. RADIATION DOSE REDUCTION: This exam was performed according to the departmental dose-optimization program which includes automated exposure control, adjustment of the mA and/or kV according to patient size and/or use of iterative reconstruction technique. CONTRAST:  42m OMNIPAQUE IOHEXOL 300 MG/ML  SOLN COMPARISON:  None Available. FINDINGS: Pharynx and larynx: Mild prominence of lingual tonsils and palatine tonsils present bilaterally without focal mass lesion. Nasopharynx is clear. Vallecula and epiglottis are within normal limits. Aryepiglottic folds and piriform sinuses are clear. Vocal cords are midline and symmetric. Trachea is clear. Salivary glands: The submandibular and parotid glands and ducts are within normal limits. Thyroid: Normal Lymph nodes: Multiple subcentimeter nodes are present bilaterally. No hyperdense rounded nodes are present. Vascular: The right IJ Port-A-Cath is now twisted on itself within the right internal jugular vein. Thrombus is noted at the twist in the catheter extending inferiorly in the right internal jugular vein. The SVC is patent. Mild inflammatory changes are present within the right carotid canal and surrounding the thrombosed vein. Limited intracranial: Within normal limits. Visualized orbits: The globes and orbits are within normal limits. Mastoids and visualized paranasal sinuses: The paranasal sinuses and mastoid air cells are clear. Skeleton: 2 body heights alignment are normal. No focal osseous lesions are present. Upper chest: The lung apices are clear. Thoracic inlet is within normal limits. IMPRESSION: 1. The right IJ Port-A-Cath is now twisted on itself within the right internal jugular vein. 2. Thrombus is noted at the twist in the catheter  extending inferiorly in the right internal jugular vein. The SVC is patent. 3. Mild inflammatory changes within the right carotid canal and surrounding the thrombosed vein. 4. Mild prominence of lingual tonsils and palatine tonsils bilaterally without focal mass lesion. This is likely reactive. Critical Value/emergent results were called by telephone at the time of interpretation on 05/26/2022 at 9:54 pm to provider [facility], who verbally acknowledged these results. Electronically Signed   By: CSan MorelleM.D.   On: 05/26/2022 21:55   DG Chest Port 1 View  Result Date: 05/26/2022 CLINICAL DATA:  Chest pain. Sickle cell pain in both legs and hips for 3 days. Right chest port with swelling of the right collar bone area. EXAM: PORTABLE CHEST 1 VIEW COMPARISON:  04/14/2022 FINDINGS: A power port type right central venous catheter is present. Since  the prior study, the tip has retracted to the level of the mid SVC and a loop is present at the expected vascular insertion site suggesting that the catheter has backed out since prior study. Heart size and pulmonary vascularity are normal. Lungs are clear. No pleural effusions. No pneumothorax. IMPRESSION: 1. Indwelling right central venous catheter appears to have retracted since the prior study with a loop suggested at the expected vascular insertion site. 2. No evidence of active pulmonary disease. Electronically Signed   By: Lucienne Capers M.D.   On: 05/26/2022 19:08    EKG: I independently viewed the EKG done and my findings are as followed: ***   Assessment/Plan Present on Admission: . Sickle cell pain crisis ([place])  Active Problems:   Sickle cell pain crisis ([place])   DVT prophylaxis: ***   Code Status: ***   Family Communication: ***   Disposition Plan: ***   Consults called: ***   Admission status: ***    Status is: Inpatient {Inpatient:23812}   Kayleen Memos MD [facility] Pager [phone]  If 7PM-7AM,  please contact night-coverage [email or web address] Password [email or web address]  05/26/2022, 11:56 PM
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
## 2022-05-26 NOTE — ED Provider Triage Note (Signed)
Emergency Medicine Provider Triage Evaluation Note  Sandra Mckay , a 26 y.o. female  was evaluated in triage.  Pt complains of right-sided chest, neck pain, difficulty swallowing, difficulty turning neck, concern for complication with her chest port.  Patient reports that the pain has caused her to have a sickle cell crisis, she is having bilateral hip, knee pain, she takes 15 mg oxycodone at home, reports no significant improvement with home medication.  She denies nausea, vomiting, shortness of breath, left-sided chest pain.  Review of Systems  Positive: Port issues, hip pain, knee pain Negative: Shob, chest pain  Physical Exam  BP 137/78 (BP Location: Left Arm)   Pulse 63   Temp 98.4 F (36.9 C) (Oral)   Resp 18   Ht '5\' 2"'$  (1.575 m)   Wt 58.1 kg   LMP 05/19/2022   SpO2 94%   BMI 23.41 kg/m  Gen:   Awake, no distress   Resp:  Normal effort  MSK:   Moves extremities without difficulty  Other:  No noted redness or fluctuance at right sided chest port but exquisite TTP of right neck, possible subtle skin swelling compared to left  Medical Decision Making  Medically screening exam initiated at 6:41 PM.  Appropriate orders placed.  Sandra Mckay was informed that the remainder of the evaluation will be completed by another provider, this initial triage assessment does not replace that evaluation, and the importance of remaining in the ED until their evaluation is complete.  Workup initiated, Spoke with Dr. Sherrye Payor of radiology who recommends a CT chest with contrast for evaluation of for complication on the right side   Anselmo Pickler, PA-C 05/26/22 1843

## 2022-05-26 NOTE — H&P (Signed)
History and Physical  Sandra Mckay DJM:[id number] DOB: [birth year] DOA: 05/26/2022  Referring physician: Dr. Melina Copa, [place]  PCP: Dorena Dew, FNP  Outpatient Specialists: Sickle cell clinic. Patient coming from: Home  Chief Complaint: Sickle cell pain crisis   HPI: Sandra Mckay is a 26 y.o. female with medical history significant for pulmonary embolism 3 months ago on Eliquis without compliance, bipolar disorder, tobacco use disorder, sickle cell disease, chronic pain syndrome, opiate dependence and tolerance, anemia of chronic disease, who presented to [facility] ED with complaints of her typical sickle cell pain crisis for the past 3 days which involved pain in her hips bilaterally knees bilaterally and bilateral lower extremities.  Also complaints of pain at the site of her port with notable edema for the past 3 days.  No erythema.  Admits to skipping her doses of Eliquis.  Sometimes forgetting to take her second dose.  No reported subjective fevers or chills.  Initially contacted the sickle cell clinic for the same complaints and was advised to go to the ER for further evaluation.  In the ED, work-up revealed sickle cell pain crisis, CT chest and CT soft tissue neck with contrast consistent with right IJ thrombosis.  Started on heparin drip, IV fluid hydration, and IV opiate-based analgesics in the ED.  IR consulted to assist with the management of her right IJ thrombosis.  TRH, hospitalist service, was asked to admit.   ED Course: Tmax 98.8.  BP 107/69, pulse 81, respiratory rate 13, O2 saturation 100% on room air.  Lab studies remarkable for hemoglobin 8.3 from 7.37 days ago.  MCV 104.  WBC 8.7, platelet count 369.  Review of Systems: Review of systems as noted in the HPI. All other systems reviewed and are negative.   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 She has been smoking an average of 1 pack per day. She has never used smokeless tobacco. She reports current alcohol use. She reports that she does not use drugs.   Allergies  Allergen Reactions   Latex Rash   Wound Dressing Adhesive Rash    Family History  Problem Relation Age of Onset   Arthritis Other        grandparent   Stroke Other    Hypertension Other    Diabetes Other        grandparent   Cancer - Other Other        Glioblastoma      Prior to Admission medications   Medication Sig Start Date End Date Taking? Authorizing Provider  albuterol (  VENTOLIN HFA) 108 (90 Base) MCG/ACT inhaler Inhale 3-4 puffs into the lungs 2 (two) times daily as needed for wheezing or shortness of breath.   Yes [provider]  apixaban (ELIQUIS) 5 MG TABS tablet Take 1 tablet (5 mg total) by mouth 2 (two) times daily. 04/23/22  Yes Tresa Garter, MD  oxyCODONE (ROXICODONE) 15 MG immediate release tablet Take 1 tablet (15 mg total) by mouth every 6 (six) hours as needed for pain. 05/19/22  Yes Dorena Dew, FNP  QUEtiapine (SEROQUEL) 25 MG tablet Take 1 tablet (25 mg total) by mouth at bedtime. Patient taking differently: Take 25 mg by mouth daily. 05/08/21 05/26/22 Yes King, Diona Foley, NP  Vitamin D, Ergocalciferol,  (DRISDOL) 1.25 MG (50000 UNIT) CAPS capsule Take 1 capsule (50,000 Units total) by mouth every 7 (seven) days for 8 doses. 04/20/22 06/09/22 Yes Tresa Garter, MD  apixaban (ELIQUIS) 5 MG TABS tablet Take 2 tablets (10 mg total) by mouth 2 (two) times daily for 4 days. Patient not taking: Reported on 05/26/2022 04/20/22 05/26/22  Tresa Garter, MD  folic acid (FOLVITE) 1 MG tablet Take 1 tablet (1 mg total) by mouth daily. Patient not taking: Reported on 05/26/2022 04/20/22   Tresa Garter, MD  gabapentin (NEURONTIN) 100 MG capsule Take 3 capsules (300 mg total) by mouth 3 (three) times daily. Patient taking differently: Take 100 mg by mouth in the morning, at noon, and at bedtime. 02/26/22   Dorena Dew, FNP  hydroxyurea (HYDREA) 500 MG capsule Take 3 capsules (1,500 mg total) by mouth daily. TAKE 3 CAPSULES (1,500 MG TOTAL) BY MOUTH DAILY. Patient taking differently: Take 1,500 mg by mouth daily. 04/20/22   Tresa Garter, MD  tiZANidine (ZANAFLEX) 4 MG tablet Take 1 tablet (4 mg total) by mouth 3 (three) times daily. Patient taking differently: Take 4 mg by mouth as needed for muscle spasms. 06/16/21 06/16/22  Vevelyn Francois, NP    Physical Exam: BP 100/62   Pulse 74   Temp 98.8 F (37.1 C)   Resp 16   Ht '5\' 2"'$  (1.575 m)   Wt 59 kg   LMP 05/19/2022   SpO2 98%   BMI 23.78 kg/m   General: 26 y.o. year-old female well developed well nourished in no acute distress.  Alert and oriented x3. Cardiovascular: Regular rate and rhythm with no rubs or gallops.  No thyromegaly or JVD noted.  No lower extremity edema. 2/4 pulses in all 4 extremities.  Right-sided port with mild edema noted.  No erythema.  No warmth.  No drainage. Respiratory: Clear to auscultation with no wheezes or rales. Good inspiratory effort. Abdomen: Soft nontender nondistended with normal bowel sounds x4 quadrants. Muskuloskeletal: No cyanosis, clubbing or edema noted bilaterally Neuro: CN  II-XII intact, strength, sensation, reflexes Skin: No ulcerative lesions noted or rashes Psychiatry: Judgement and insight appear normal. Mood is appropriate for condition and setting          Labs on Admission:  Basic Metabolic Panel: Recent Labs  Lab 05/26/22 1931  NA 135  K 3.8  CL 105  CO2 24  GLUCOSE 99  BUN 6  CREATININE 0.43*  CALCIUM 8.9   Liver Function Tests: No results for input(s): "AST", "ALT", "ALKPHOS", "BILITOT", "PROT", "ALBUMIN" in the last 168 hours. No results for input(s): "LIPASE", "AMYLASE" in the last 168 hours. No results for input(s): "AMMONIA" in the last 168 hours. CBC: Recent Labs  Lab 05/26/22 1931  WBC 8.7  NEUTROABS 5.1  HGB 8.3*  HCT 23.8*  MCV 104.4*  PLT 369   Cardiac Enzymes: No results for input(s): "CKTOTAL", "CKMB", "CKMBINDEX", "TROPONINI" in the last 168 hours.  BNP (last 3 results) No results for input(s): "BNP" in the last 8760 hours.  ProBNP (last 3 results) No results for input(s): "PROBNP" in the last 8760 hours.  CBG: No results for input(s): "GLUCAP" in the last 168 hours.  Radiological Exams on Admission: CT Chest W Contrast  Result Date: 05/26/2022 CLINICAL DATA:  Chest wall pain, infection or inflammation suspected. Right-sided chest pain with difficulty swallowing, and neck pain. Concern for chest port complication. Sickle cell crisis. EXAM: CT CHEST WITH CONTRAST TECHNIQUE: Multidetector CT imaging of the chest was performed during intravenous contrast administration. RADIATION DOSE REDUCTION: This exam was performed according to the departmental dose-optimization program which includes automated exposure control, adjustment of the mA and/or kV according to patient size and/or use of iterative reconstruction technique. CONTRAST:  41m OMNIPAQUE IOHEXOL 300 MG/ML  SOLN COMPARISON:  04/14/2022. FINDINGS: Cardiovascular: The heart is mildly enlarged and there is no pericardial effusion. The aorta and and pulmonary  trunk are normal in caliber. A right chest wall port is noted with looping in the region of the insertion site in the internal jugular vein with retraction. There is suggestion of edema at the level of insertion in the internal jugular vein with mild mass effect upon the thyroid gland and trachea. The chest port terminates in the superior vena cava. Mediastinum/Nodes: No mediastinal, hilar, or axillary lymphadenopathy. The thyroid gland, trachea, and esophagus are within normal limits. Lungs/Pleura: Strandy opacities are present at the lung bases. No consolidation, effusion, or pneumothorax. Upper Abdomen: The gallbladder is surgically absent. No acute abnormality. Musculoskeletal: No acute or suspicious osseous abnormality. IMPRESSION: 1. Right chest port with interval looping in the region of the insertion in the internal jugular vein with retraction. Mild edema is noted adjacent to the catheter loop with mild mass effect on the trachea and thyroid gland. Chest port terminates in the superior vena cava. Please see CT neck for additional information. 2. Mild cardiomegaly. 3. Strandy atelectasis or infiltrate at the lung bases. Electronically Signed   By: LBrett FairyM.D.   On: 05/26/2022 21:57   CT Soft Tissue Neck W Contrast  Result Date: 05/26/2022 CLINICAL DATA:  Soft tissue swelling, infection suspected. Right-sided neck pain. Difficulty swallowing. EXAM: CT NECK WITH CONTRAST TECHNIQUE: Multidetector CT imaging of the neck was performed using the standard protocol following the bolus administration of intravenous contrast. RADIATION DOSE REDUCTION: This exam was performed according to the departmental dose-optimization program which includes automated exposure control, adjustment of the mA and/or kV according to patient size and/or use of iterative reconstruction technique. CONTRAST:  852mOMNIPAQUE IOHEXOL 300 MG/ML  SOLN COMPARISON:  None Available. FINDINGS: Pharynx and larynx: Mild prominence of  lingual tonsils and palatine tonsils present bilaterally without focal mass lesion. Nasopharynx is clear. Vallecula and epiglottis are within normal limits. Aryepiglottic folds and piriform sinuses are clear. Vocal cords are midline and symmetric. Trachea is clear. Salivary glands: The submandibular and parotid glands and ducts are within normal limits. Thyroid: Normal Lymph nodes: Multiple subcentimeter nodes are present bilaterally. No hyperdense rounded nodes are present. Vascular: The right IJ Port-A-Cath is now twisted on itself within the right internal jugular vein. Thrombus is noted at the twist in the catheter extending inferiorly in the right internal jugular vein. The SVC is patent. Mild inflammatory  changes are present within the right carotid canal and surrounding the thrombosed vein. Limited intracranial: Within normal limits. Visualized orbits: The globes and orbits are within normal limits. Mastoids and visualized paranasal sinuses: The paranasal sinuses and mastoid air cells are clear. Skeleton: 2 body heights alignment are normal. No focal osseous lesions are present. Upper chest: The lung apices are clear. Thoracic inlet is within normal limits. IMPRESSION: 1. The right IJ Port-A-Cath is now twisted on itself within the right internal jugular vein. 2. Thrombus is noted at the twist in the catheter extending inferiorly in the right internal jugular vein. The SVC is patent. 3. Mild inflammatory changes within the right carotid canal and surrounding the thrombosed vein. 4. Mild prominence of lingual tonsils and palatine tonsils bilaterally without focal mass lesion. This is likely reactive. Critical Value/emergent results were called by telephone at the time of interpretation on 05/26/2022 at 9:54 pm to provider [facility] , who verbally acknowledged these results. Electronically Signed   By: San Morelle M.D.   On: 05/26/2022 21:55   DG Chest Port 1 View  Result Date:  05/26/2022 CLINICAL DATA:  Chest pain. Sickle cell pain in both legs and hips for 3 days. Right chest port with swelling of the right collar bone area. EXAM: PORTABLE CHEST 1 VIEW COMPARISON:  04/14/2022 FINDINGS: A power port type right central venous catheter is present. Since the prior study, the tip has retracted to the level of the mid SVC and a loop is present at the expected vascular insertion site suggesting that the catheter has backed out since prior study. Heart size and pulmonary vascularity are normal. Lungs are clear. No pleural effusions. No pneumothorax. IMPRESSION: 1. Indwelling right central venous catheter appears to have retracted since the prior study with a loop suggested at the expected vascular insertion site. 2. No evidence of active pulmonary disease. Electronically Signed   By: Lucienne Capers M.D.   On: 05/26/2022 19:08    EKG: I independently viewed the EKG done and my findings are as followed: Sinus rhythm rate of 79.  Nonspecific ST-T changes with QTc 434.  Assessment/Plan Present on Admission:  Sickle cell pain crisis ([place])  Active Problems:   Sickle cell pain crisis (HCC)  Sickle cell pain crisis, possibly triggered by change in weather Endorses her sickle cell pain crisis is triggered by change in weather and stress. IV opiate-based analgesics as needed while in the ED Dilaudid PCA pump IV antiemetics and bowel regimen IV fluid hydration Benadryl for itching Resume home hydroxyurea and folic acid supplements  Right IJ thrombosis, POA History of pulmonary embolism diagnosed 3 months ago Noncompliant with DOAC, home Eliquis. Heparin drip started in the ED, continue IR consulted to assist with the management Pain management in place with bowel regimen  Bipolar disorder Resume home regimen  Tobacco use disorder Alcohol use disorder No evidence of alcohol withdrawal at this time Nicotine patch CIWA protocol  Anemia of chronic disease Hemoglobin  8.3 Stable No overt bleeding.  Medication noncompliance Counseled on the importance of medication compliance. May benefit from daily DOAC versus twice daily DOAC.    Critical care time: 65 minutes.    DVT prophylaxis: Full dose IV heparin  Code Status: Full code  Family Communication: None at bedside  Disposition Plan: Admitted to progressive care unit  Consults called: Interventional radiology  Admission status: Inpatient status.   Status is: Inpatient The patient requires at least 2 midnights for further evaluation and treatment of present condition.  Kayleen Memos MD [facility] Pager [phone]  If 7PM-7AM, please contact night-coverage [email or web address] Password [email or web address]  05/26/2022, 11:56 PM

## 2022-05-27 DIAGNOSIS — F1010 Alcohol abuse, uncomplicated: Secondary | ICD-10-CM

## 2022-05-27 DIAGNOSIS — D5700 Hb-SS disease with crisis, unspecified: Secondary | ICD-10-CM | POA: Diagnosis not present

## 2022-05-27 LAB — COMPREHENSIVE METABOLIC PANEL
ALT: 106 U/L — ABNORMAL HIGH (ref 0–44)
AST: 211 U/L — ABNORMAL HIGH (ref 15–41)
Albumin: 3.8 g/dL (ref 3.5–5.0)
Alkaline Phosphatase: 143 U/L — ABNORMAL HIGH (ref 38–126)
Anion gap: 7 (ref 5–15)
BUN: 6 mg/dL (ref 6–20)
CO2: 23 mmol/L (ref 22–32)
Calcium: 8.6 mg/dL — ABNORMAL LOW (ref 8.9–10.3)
Chloride: 105 mmol/L (ref 98–111)
Creatinine, Ser: 0.31 mg/dL — ABNORMAL LOW (ref 0.44–1.00)
GFR, Estimated: >60 mL/min (ref >60)
Glucose, Bld: 95 mg/dL (ref 70–99)
Potassium: 3.9 mmol/L (ref 3.5–5.1)
Sodium: 135 mmol/L (ref 135–145)
Total Bilirubin: 4.2 mg/dL — ABNORMAL HIGH (ref 0.3–1.2)
Total Protein: 6.4 g/dL — ABNORMAL LOW (ref 6.5–8.1)

## 2022-05-27 LAB — HEPARIN LEVEL (UNFRACTIONATED)
Heparin Unfractionated: 0.19 IU/mL — ABNORMAL LOW (ref 0.30–0.70)
Heparin Unfractionated: 0.38 IU/mL (ref 0.30–0.70)

## 2022-05-27 LAB — CBC
HCT: 22 % — ABNORMAL LOW (ref 36.0–46.0)
Hemoglobin: 7.5 g/dL — ABNORMAL LOW (ref 12.0–15.0)
MCH: 35.7 pg — ABNORMAL HIGH (ref 26.0–34.0)
MCHC: 34.1 g/dL (ref 30.0–36.0)
MCV: 104.8 fL — ABNORMAL HIGH (ref 80.0–100.0)
Platelets: 363 10*3/uL (ref 150–400)
RBC: 2.1 MIL/uL — ABNORMAL LOW (ref 3.87–5.11)
RDW: 20.9 % — ABNORMAL HIGH (ref 11.5–15.5)
WBC: 7.3 10*3/uL (ref 4.0–10.5)
nRBC: 0.4 % — ABNORMAL HIGH (ref 0.0–0.2)

## 2022-05-27 LAB — MAGNESIUM: Magnesium: 2 mg/dL (ref 1.7–2.4)

## 2022-05-27 LAB — PHOSPHORUS: Phosphorus: 4.2 mg/dL (ref 2.5–4.6)

## 2022-05-27 MED ORDER — OXYCODONE HCL ER 10 MG PO T12A
10.0000 mg | EXTENDED_RELEASE_TABLET | Freq: Two times a day (BID) | ORAL | Status: DC
  Administered 2022-05-27 – 2022-05-30 (×8): 10 mg via ORAL
  Filled 2022-05-27 (×9): qty 1

## 2022-05-27 MED ORDER — NICOTINE 14 MG/24HR TD PT24
14.0000 mg | MEDICATED_PATCH | Freq: Every day | TRANSDERMAL | Status: DC
  Filled 2022-05-27 (×4): qty 1

## 2022-05-27 MED ORDER — HYDROMORPHONE HCL 1 MG/ML IJ SOLN
1.0000 mg | INTRAMUSCULAR | Status: DC | PRN
  Administered 2022-05-27 (×3): 1 mg via INTRAVENOUS
  Filled 2022-05-27 (×3): qty 1

## 2022-05-27 MED ORDER — FOLIC ACID 1 MG PO TABS: 1.0000 mg | ORAL_TABLET | Freq: Every day | ORAL | Status: DC

## 2022-05-27 MED ORDER — LORAZEPAM 1 MG PO TABS
1.0000 mg | ORAL_TABLET | ORAL | Status: DC | PRN
  Administered 2022-05-27: 2 mg via ORAL
  Administered 2022-05-28: 1 mg via ORAL
  Administered 2022-05-28 (×2): 2 mg via ORAL
  Administered 2022-05-29 (×2): 1 mg via ORAL
  Filled 2022-05-27 (×2): qty 1
  Filled 2022-05-27 (×2): qty 2
  Filled 2022-05-27: qty 1
  Filled 2022-05-27: qty 2

## 2022-05-27 MED ORDER — ALBUTEROL SULFATE (2.5 MG/3ML) 0.083% IN NEBU: 2.5000 mg | INHALATION_SOLUTION | Freq: Four times a day (QID) | RESPIRATORY_TRACT | Status: DC | PRN

## 2022-05-27 MED ORDER — THIAMINE MONONITRATE 100 MG PO TABS
100.0000 mg | ORAL_TABLET | Freq: Every day | ORAL | Status: DC
  Administered 2022-05-27 – 2022-05-30 (×4): 100 mg via ORAL
  Filled 2022-05-27 (×5): qty 1

## 2022-05-27 MED ORDER — LORAZEPAM 2 MG/ML IJ SOLN
1.0000 mg | INTRAMUSCULAR | Status: DC | PRN
  Administered 2022-05-28: 2 mg via INTRAVENOUS
  Administered 2022-05-29 (×2): 1 mg via INTRAVENOUS
  Filled 2022-05-27 (×3): qty 1

## 2022-05-27 MED ORDER — ADULT MULTIVITAMIN W/MINERALS CH
1.0000 | ORAL_TABLET | Freq: Every day | ORAL | Status: DC
  Administered 2022-05-27 – 2022-05-29 (×3): 1 via ORAL
  Filled 2022-05-27 (×6): qty 1

## 2022-05-27 MED ORDER — THIAMINE HCL 100 MG/ML IJ SOLN: 100.0000 mg | Freq: Every day | INTRAMUSCULAR | Status: DC

## 2022-05-27 MED ORDER — OXYCODONE HCL 5 MG PO TABS
15.0000 mg | ORAL_TABLET | Freq: Four times a day (QID) | ORAL | Status: DC | PRN
  Administered 2022-05-27 – 2022-05-31 (×12): 15 mg via ORAL
  Filled 2022-05-27 (×12): qty 3

## 2022-05-27 NOTE — Consult Note (Signed)
Chief Complaint: Patient was seen in consultation today for right internal jugular thrombosis  Referring Physician(s): Irene Pap, DO  Supervising Physician: Aletta Edouard  Patient Status: [facility] - ED  History of Present Illness: Sandra Mckay is a 26 y.o. female with PMH of sickle cell disease, thrombocytosis, heart murmur, and pulmonary hypertension being seen today in relation to a right internal jugular thrombus. The patient is known to IR from port placement on 03/06/22 with Dr Anselm Pancoast. The patient initially presented to [facility] ED for a sickle cell pain crisis. The patient is also experiencing pain in the neck, particularly with turning her head or palpation of her port. CT Neck with Contrast revealed that the port tubing is twisted in the IJ with a thrombus noted in the twist and extending inferiorly.   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 Authorizing Provider  albuterol (VENTOLIN HFA) 108 (90 Base) MCG/ACT inhaler Inhale 3-4 puffs into the lungs 2 (two) times daily as needed for wheezing or shortness of breath.   Yes [provider]  apixaban (ELIQUIS) 5 MG TABS tablet Take 1 tablet (5 mg total) by mouth 2 (two) times daily. 04/23/22  Yes Tresa Garter, MD  oxyCODONE (ROXICODONE) 15 MG immediate release tablet Take 1 tablet (15 mg total) by mouth every 6 (six) hours as needed for pain. 05/19/22  Yes Dorena Dew, FNP  QUEtiapine (SEROQUEL) 25 MG tablet Take 1 tablet (25 mg total) by mouth at bedtime. Patient taking differently: Take 25 mg by mouth daily. 05/08/21 05/26/22 Yes King, Diona Foley, NP  Vitamin D, Ergocalciferol, (DRISDOL) 1.25 MG (50000 UNIT) CAPS capsule Take 1 capsule (50,000 Units total) by mouth every 7 (seven) days for 8 doses. 04/20/22 06/09/22 Yes Tresa Garter, MD  apixaban (ELIQUIS) 5 MG TABS tablet Take 2 tablets (10 mg total) by mouth 2 (two) times daily for 4 days. Patient not taking: Reported on 05/26/2022 04/20/22 05/26/22  Tresa Garter, MD  folic acid (FOLVITE) 1 MG tablet Take 1 tablet (1 mg total) by mouth daily. Patient not taking: Reported on 05/26/2022 04/20/22   Tresa Garter, MD  gabapentin (NEURONTIN) 100 MG capsule Take 3 capsules (300 mg total) by mouth 3 (three) times daily. Patient taking differently: Take 100 mg by mouth in the morning, at noon, and at bedtime. 02/26/22   Dorena Dew, FNP  hydroxyurea (HYDREA) 500 MG capsule Take 3 capsules (1,500 mg total) by mouth daily. TAKE 3 CAPSULES (  1,500 MG TOTAL) BY MOUTH DAILY. Patient taking differently: Take 1,500 mg by mouth daily. 04/20/22   Tresa Garter, MD  tiZANidine (ZANAFLEX) 4 MG tablet Take 1 tablet (4 mg total) by mouth 3 (three) times daily. Patient taking differently: Take 4 mg by mouth as needed for muscle spasms. 06/16/21 06/16/22  Vevelyn Francois, NP     Family History  Problem Relation Age of Onset   Arthritis Other        grandparent   Stroke Other    Hypertension Other    Diabetes Other        grandparent   Cancer - Other Other        Glioblastoma    Social  History   Socioeconomic History   Marital status: Single    Spouse name: Not on file   Number of children: Not on file   Years of education: Not on file   Highest education level: Not on file  Occupational History   Not on file  Tobacco Use   Smoking status: Some Days    Packs/day: 1.00    Types: Cigarettes   Smokeless tobacco: Never   Tobacco comments:    1 pack twice a week  Vaping Use   Vaping Use: Never used  Substance and Sexual Activity   Alcohol use: Yes    Alcohol/week: 0.0 standard drinks of alcohol    Comment: occ   Drug use: No   Sexual activity: Yes  Other Topics Concern   Not on file  Social History Narrative   Lives with mom in a one story home.  Education: 2 years of college.    Social Determinants of Health   Financial Resource Strain: Not on file  Food Insecurity: No Food Insecurity (04/14/2022)   Hunger Vital Sign    Worried About Running Out of Food in the Last Year: Never true    Ran Out of Food in the Last Year: Never true  Transportation Needs: No Transportation Needs (04/17/2022)   PRAPARE - Hydrologist (Medical): No    Lack of Transportation (Non-Medical): No  Physical Activity: Not on file  Stress: Not on file  Social Connections: Not on file     Review of Systems: A 12 point ROS discussed and pertinent positives are indicated in the HPI above.  All other systems are negative.  Review of Systems  Constitutional:  Negative for chills and fever.  Respiratory:  Negative for chest tightness and shortness of breath.   Cardiovascular:  Negative for chest pain and leg swelling.  Gastrointestinal:  Positive for nausea. Negative for diarrhea and vomiting.  Musculoskeletal:  Positive for neck pain.  Neurological:  Negative for dizziness, light-headedness and headaches.  Psychiatric/Behavioral:  Negative for confusion.     Vital Signs: BP 115/73   Pulse 78   Temp 98.3 F (36.8 C) (Oral)   Resp 14   Ht '5\' 2"'$   (1.575 m)   Wt 130 lb (59 kg)   LMP 05/19/2022   SpO2 98%   BMI 23.78 kg/m     Physical Exam Vitals reviewed.  Constitutional:      General: She is not in acute distress.    Appearance: She is not ill-appearing.  HENT:     Mouth/Throat:     Mouth: Mucous membranes are moist.  Neck:     Comments: Palpable mass in right anterior neck Cardiovascular:     Rate and Rhythm: Normal rate and regular  rhythm.     Pulses: Normal pulses.     Heart sounds: Normal heart sounds.  Pulmonary:     Effort: Pulmonary effort is normal.     Breath sounds: Normal breath sounds.  Abdominal:     General: Bowel sounds are normal.     Palpations: Abdomen is soft.     Tenderness: There is no abdominal tenderness.  Musculoskeletal:     Cervical back: Tenderness present.     Right lower leg: No edema.     Left lower leg: No edema.  Skin:    General: Skin is warm and dry.  Neurological:     Mental Status: She is alert and oriented to person, place, and time.  Psychiatric:        Mood and Affect: Mood normal.        Behavior: Behavior normal.        Thought Content: Thought content normal.        Judgment: Judgment normal.     Imaging: CT Chest W Contrast  Result Date: 05/26/2022 CLINICAL DATA:  Chest wall pain, infection or inflammation suspected. Right-sided chest pain with difficulty swallowing, and neck pain. Concern for chest port complication. Sickle cell crisis. EXAM: CT CHEST WITH CONTRAST TECHNIQUE: Multidetector CT imaging of the chest was performed during intravenous contrast administration. RADIATION DOSE REDUCTION: This exam was performed according to the departmental dose-optimization program which includes automated exposure control, adjustment of the mA and/or kV according to patient size and/or use of iterative reconstruction technique. CONTRAST:  17m OMNIPAQUE IOHEXOL 300 MG/ML  SOLN COMPARISON:  04/14/2022. FINDINGS: Cardiovascular: The heart is mildly enlarged and there is no  pericardial effusion. The aorta and and pulmonary trunk are normal in caliber. A right chest wall port is noted with looping in the region of the insertion site in the internal jugular vein with retraction. There is suggestion of edema at the level of insertion in the internal jugular vein with mild mass effect upon the thyroid gland and trachea. The chest port terminates in the superior vena cava. Mediastinum/Nodes: No mediastinal, hilar, or axillary lymphadenopathy. The thyroid gland, trachea, and esophagus are within normal limits. Lungs/Pleura: Strandy opacities are present at the lung bases. No consolidation, effusion, or pneumothorax. Upper Abdomen: The gallbladder is surgically absent. No acute abnormality. Musculoskeletal: No acute or suspicious osseous abnormality. IMPRESSION: 1. Right chest port with interval looping in the region of the insertion in the internal jugular vein with retraction. Mild edema is noted adjacent to the catheter loop with mild mass effect on the trachea and thyroid gland. Chest port terminates in the superior vena cava. Please see CT neck for additional information. 2. Mild cardiomegaly. 3. Strandy atelectasis or infiltrate at the lung bases. Electronically Signed   By: LBrett FairyM.D.   On: 05/26/2022 21:57   CT Soft Tissue Neck W Contrast  Result Date: 05/26/2022 CLINICAL DATA:  Soft tissue swelling, infection suspected. Right-sided neck pain. Difficulty swallowing. EXAM: CT NECK WITH CONTRAST TECHNIQUE: Multidetector CT imaging of the neck was performed using the standard protocol following the bolus administration of intravenous contrast. RADIATION DOSE REDUCTION: This exam was performed according to the departmental dose-optimization program which includes automated exposure control, adjustment of the mA and/or kV according to patient size and/or use of iterative reconstruction technique. CONTRAST:  886mOMNIPAQUE IOHEXOL 300 MG/ML  SOLN COMPARISON:  None Available.  FINDINGS: Pharynx and larynx: Mild prominence of lingual tonsils and palatine tonsils present bilaterally without focal mass lesion. Nasopharynx  is clear. Vallecula and epiglottis are within normal limits. Aryepiglottic folds and piriform sinuses are clear. Vocal cords are midline and symmetric. Trachea is clear. Salivary glands: The submandibular and parotid glands and ducts are within normal limits. Thyroid: Normal Lymph nodes: Multiple subcentimeter nodes are present bilaterally. No hyperdense rounded nodes are present. Vascular: The right IJ Port-A-Cath is now twisted on itself within the right internal jugular vein. Thrombus is noted at the twist in the catheter extending inferiorly in the right internal jugular vein. The SVC is patent. Mild inflammatory changes are present within the right carotid canal and surrounding the thrombosed vein. Limited intracranial: Within normal limits. Visualized orbits: The globes and orbits are within normal limits. Mastoids and visualized paranasal sinuses: The paranasal sinuses and mastoid air cells are clear. Skeleton: 2 body heights alignment are normal. No focal osseous lesions are present. Upper chest: The lung apices are clear. Thoracic inlet is within normal limits. IMPRESSION: 1. The right IJ Port-A-Cath is now twisted on itself within the right internal jugular vein. 2. Thrombus is noted at the twist in the catheter extending inferiorly in the right internal jugular vein. The SVC is patent. 3. Mild inflammatory changes within the right carotid canal and surrounding the thrombosed vein. 4. Mild prominence of lingual tonsils and palatine tonsils bilaterally without focal mass lesion. This is likely reactive. Critical Value/emergent results were called by telephone at the time of interpretation on 05/26/2022 at 9:54 pm to provider [facility] , who verbally acknowledged these results. Electronically Signed   By: San Morelle M.D.   On: 05/26/2022 21:55   DG  Chest Port 1 View  Result Date: 05/26/2022 CLINICAL DATA:  Chest pain. Sickle cell pain in both legs and hips for 3 days. Right chest port with swelling of the right collar bone area. EXAM: PORTABLE CHEST 1 VIEW COMPARISON:  04/14/2022 FINDINGS: A power port type right central venous catheter is present. Since the prior study, the tip has retracted to the level of the mid SVC and a loop is present at the expected vascular insertion site suggesting that the catheter has backed out since prior study. Heart size and pulmonary vascularity are normal. Lungs are clear. No pleural effusions. No pneumothorax. IMPRESSION: 1. Indwelling right central venous catheter appears to have retracted since the prior study with a loop suggested at the expected vascular insertion site. 2. No evidence of active pulmonary disease. Electronically Signed   By: Lucienne Capers M.D.   On: 05/26/2022 19:08    Labs:  CBC: Recent Labs    04/20/22 0500 05/04/22 0900 05/19/22 0935 05/26/22 1931  WBC 10.2 5.8 7.1 8.7  HGB 7.6* 8.4* 7.3* 8.3*  HCT 22.5* 25.3* 21.7* 23.8*  PLT 524* 590* 343 369    COAGS: Recent Labs    05/26/22 1931 05/26/22 2238  INR  --  1.1  APTT 35  --     BMP: Recent Labs    05/04/22 0900 05/19/22 0935 05/26/22 1931 05/27/22 0500  NA 138 138 135 135  K 3.8 3.7 3.8 3.9  CL 108 108 105 105  CO2 '24 24 24 23  '$ GLUCOSE 93 90 99 95  BUN '10 9 6 6  '$ CALCIUM 9.1 9.3 8.9 8.6*  CREATININE 0.40* 0.46 0.43* 0.31*  GFRNONAA >60 >60 >60 >60    LIVER FUNCTION TESTS: Recent Labs    04/19/22 0432 05/04/22 0900 05/19/22 0935 05/27/22 0500  BILITOT 2.1* 1.6* 1.4* 4.2*  AST 25 25 34 211*  ALT  $'20 19 26 'Z$ 106*  ALKPHOS 77 77 93 143*  PROT 6.4* 7.2 7.4 6.4*  ALBUMIN 3.3* 4.3 4.6 3.8    TUMOR MARKERS: No results for input(s): "AFPTM", "CEA", "CA199", "CHROMGRNA" in the last 8760 hours.  Assessment and Plan:  Sandra Mckay is a 26 yo female known to IR from port placement 03/06/22 with PMH  of sickle cell disease, thrombocytosis, heart murmur, and pulmonary hypertension being seen today in relation to a right internal jugular thrombus associated with port-a-catheter. The case has been reviewed by Dr Kathlene Cote and the plan is to continue treating with heparin, and if the patient is still symptomatic on 10/26, the patient will have an image-guided port revision with possible port removal. Of note, the patient states she does not consent to placement of a new port if the current one is unable to be revised, but she is amenable to the current port being removed if deemed necessary.  Risks and benefits of image guided port-a-catheter revision with possible removal was discussed with the patient including, but not limited to bleeding, infection, pneumothorax, or fibrin sheath development and need for additional procedures.  All of the patient's questions were answered, patient is agreeable to proceed. Consent signed and in chart.   Thank you for this interesting consult.  I greatly enjoyed meeting Sandra Mckay and look forward to participating in their care.  A copy of this report was sent to the requesting provider on this date.  Electronically Signed: Lura Em, PA-C 05/27/2022, 11:54 AM   I spent a total of 40 Minutes    in face to face in clinical consultation, greater than 50% of which was counseling/coordinating care for right internal jugular thrombus associated with port-a-catheter.

## 2022-05-27 NOTE — Progress Notes (Addendum)
Subjective: Sandra Mckay is a 26 year old female with a medical history significant for sickle cell disease, chronic pain syndrome, opiate dependence and tolerance, history of PE on Eliquis, history of anemia of chronic disease was admitted for right IJ thrombosis and sickle cell pain crisis  Sandra Mckay is complaining of pain primarily to chest, back, and lower extremities.  She also endorses right neck pain.  Pain has not been well controlled in the emergency department.  She rates her pain as 10/10.  She denies headache, shortness of breath, urinary symptoms, nausea, vomiting, or diarrhea.  Objective:  Vital signs in last 24 hours:  Vitals:   05/27/22 1245 05/27/22 1300 05/27/22 1330 05/27/22 1400  BP: 107/61 (!) 106/58 116/60 (!) 106/48  Pulse: 69 66 79 66  Resp: '16 14 14 14  '$ Temp:      TempSrc:      SpO2: 100% 96% 96% 96%  Weight:      Height:        Intake/Output from previous day:  No intake or output data in the 24 hours ending 05/27/22 1606  Physical Exam: General: Alert, awake, oriented x3, in no acute distress.  HEENT: [place]/AT PEERL, EOMI Neck: Trachea midline,  no masses, no thyromegal,y no JVD, no carotid bruit OROPHARYNX:  Moist, No exudate/ erythema/lesions.  Heart: Regular rate and rhythm, without murmurs, rubs, gallops, PMI non-displaced, no heaves or thrills on palpation.  Lungs: Clear to auscultation, no wheezing or rhonchi noted. No increased vocal fremitus resonant to percussion  Abdomen: Soft, nontender, nondistended, positive bowel sounds, no masses no hepatosplenomegaly noted..  Neuro: No focal neurological deficits noted cranial nerves II through XII grossly intact. DTRs 2+ bilaterally upper and lower extremities. Strength 5 out of 5 in bilateral upper and lower extremities. Musculoskeletal: No warm swelling or erythema around joints, no spinal tenderness noted. Psychiatric: Patient alert and oriented x3, good insight and cognition, good recent to remote  recall. Lymph node survey: No cervical axillary or inguinal lymphadenopathy noted.  Lab Results:  Basic Metabolic Panel:    Component Value Date/Time   NA 135 05/27/2022 0500   NA 138 01/14/2022 1136   K 3.9 05/27/2022 0500   CL 105 05/27/2022 0500   CO2 23 05/27/2022 0500   BUN 6 05/27/2022 0500   BUN 9 01/14/2022 1136   CREATININE 0.31 (L) 05/27/2022 0500   CREATININE 0.53 05/31/2017 1145   GLUCOSE 95 05/27/2022 0500   CALCIUM 8.6 (L) 05/27/2022 0500   CBC:    Component Value Date/Time   WBC 8.7 05/26/2022 1931   HGB 8.3 (L) 05/26/2022 1931   HGB 9.5 (L) 01/14/2022 1136   HCT 23.8 (L) 05/26/2022 1931   HCT 26.9 (L) 01/14/2022 1136   PLT 369 05/26/2022 1931   PLT 452 (H) 01/14/2022 1136   MCV 104.4 (H) 05/26/2022 1931   MCV 101 (H) 01/14/2022 1136   NEUTROABS 5.1 05/26/2022 1931   NEUTROABS 6.1 01/14/2022 1136   LYMPHSABS 2.3 05/26/2022 1931   LYMPHSABS 1.9 01/14/2022 1136   MONOABS 1.1 (H) 05/26/2022 1931   EOSABS 0.1 05/26/2022 1931   EOSABS 0.1 01/14/2022 1136   BASOSABS 0.0 05/26/2022 1931   BASOSABS 0.1 01/14/2022 1136    No results found for this or any previous visit (from the past 240 hour(s)).  Studies/Results: CT Chest W Contrast  Result Date: 05/26/2022 CLINICAL DATA:  Chest wall pain, infection or inflammation suspected. Right-sided chest pain with difficulty swallowing, and neck pain. Concern for chest port complication. Sickle cell  crisis. EXAM: CT CHEST WITH CONTRAST TECHNIQUE: Multidetector CT imaging of the chest was performed during intravenous contrast administration. RADIATION DOSE REDUCTION: This exam was performed according to the departmental dose-optimization program which includes automated exposure control, adjustment of the mA and/or kV according to patient size and/or use of iterative reconstruction technique. CONTRAST:  87m OMNIPAQUE IOHEXOL 300 MG/ML  SOLN COMPARISON:  04/14/2022. FINDINGS: Cardiovascular: The heart is mildly enlarged  and there is no pericardial effusion. The aorta and and pulmonary trunk are normal in caliber. A right chest wall port is noted with looping in the region of the insertion site in the internal jugular vein with retraction. There is suggestion of edema at the level of insertion in the internal jugular vein with mild mass effect upon the thyroid gland and trachea. The chest port terminates in the superior vena cava. Mediastinum/Nodes: No mediastinal, hilar, or axillary lymphadenopathy. The thyroid gland, trachea, and esophagus are within normal limits. Lungs/Pleura: Strandy opacities are present at the lung bases. No consolidation, effusion, or pneumothorax. Upper Abdomen: The gallbladder is surgically absent. No acute abnormality. Musculoskeletal: No acute or suspicious osseous abnormality. IMPRESSION: 1. Right chest port with interval looping in the region of the insertion in the internal jugular vein with retraction. Mild edema is noted adjacent to the catheter loop with mild mass effect on the trachea and thyroid gland. Chest port terminates in the superior vena cava. Please see CT neck for additional information. 2. Mild cardiomegaly. 3. Strandy atelectasis or infiltrate at the lung bases. Electronically Signed   By: LBrett FairyM.D.   On: 05/26/2022 21:57   CT Soft Tissue Neck W Contrast  Result Date: 05/26/2022 CLINICAL DATA:  Soft tissue swelling, infection suspected. Right-sided neck pain. Difficulty swallowing. EXAM: CT NECK WITH CONTRAST TECHNIQUE: Multidetector CT imaging of the neck was performed using the standard protocol following the bolus administration of intravenous contrast. RADIATION DOSE REDUCTION: This exam was performed according to the departmental dose-optimization program which includes automated exposure control, adjustment of the mA and/or kV according to patient size and/or use of iterative reconstruction technique. CONTRAST:  835mOMNIPAQUE IOHEXOL 300 MG/ML  SOLN COMPARISON:   None Available. FINDINGS: Pharynx and larynx: Mild prominence of lingual tonsils and palatine tonsils present bilaterally without focal mass lesion. Nasopharynx is clear. Vallecula and epiglottis are within normal limits. Aryepiglottic folds and piriform sinuses are clear. Vocal cords are midline and symmetric. Trachea is clear. Salivary glands: The submandibular and parotid glands and ducts are within normal limits. Thyroid: Normal Lymph nodes: Multiple subcentimeter nodes are present bilaterally. No hyperdense rounded nodes are present. Vascular: The right IJ Port-A-Cath is now twisted on itself within the right internal jugular vein. Thrombus is noted at the twist in the catheter extending inferiorly in the right internal jugular vein. The SVC is patent. Mild inflammatory changes are present within the right carotid canal and surrounding the thrombosed vein. Limited intracranial: Within normal limits. Visualized orbits: The globes and orbits are within normal limits. Mastoids and visualized paranasal sinuses: The paranasal sinuses and mastoid air cells are clear. Skeleton: 2 body heights alignment are normal. No focal osseous lesions are present. Upper chest: The lung apices are clear. Thoracic inlet is within normal limits. IMPRESSION: 1. The right IJ Port-A-Cath is now twisted on itself within the right internal jugular vein. 2. Thrombus is noted at the twist in the catheter extending inferiorly in the right internal jugular vein. The SVC is patent. 3. Mild inflammatory changes within the right carotid  canal and surrounding the thrombosed vein. 4. Mild prominence of lingual tonsils and palatine tonsils bilaterally without focal mass lesion. This is likely reactive. Critical Value/emergent results were called by telephone at the time of interpretation on 05/26/2022 at 9:54 pm to provider [facility] , who verbally acknowledged these results. Electronically Signed   By: San Morelle M.D.   On:  05/26/2022 21:55   DG Chest Port 1 View  Result Date: 05/26/2022 CLINICAL DATA:  Chest pain. Sickle cell pain in both legs and hips for 3 days. Right chest port with swelling of the right collar bone area. EXAM: PORTABLE CHEST 1 VIEW COMPARISON:  04/14/2022 FINDINGS: A power port type right central venous catheter is present. Since the prior study, the tip has retracted to the level of the mid SVC and a loop is present at the expected vascular insertion site suggesting that the catheter has backed out since prior study. Heart size and pulmonary vascularity are normal. Lungs are clear. No pleural effusions. No pneumothorax. IMPRESSION: 1. Indwelling right central venous catheter appears to have retracted since the prior study with a loop suggested at the expected vascular insertion site. 2. No evidence of active pulmonary disease. Electronically Signed   By: Lucienne Capers M.D.   On: 05/26/2022 19:08    Medications: Scheduled Meds:  folic acid  1 mg Oral Daily   HYDROmorphone   Intravenous Q4H   hydroxyurea  1,500 mg Oral Daily   multivitamin with minerals  1 tablet Oral Daily   nicotine  14 mg Transdermal Daily   oxyCODONE  10 mg Oral Q12H   QUEtiapine  25 mg Oral QHS   senna-docusate  1 tablet Oral QHS   thiamine  100 mg Oral Daily   Or   thiamine  100 mg Intravenous Daily   Continuous Infusions:  sodium chloride 75 mL/hr at 05/27/22 1242   heparin 1,250 Units/hr (05/27/22 1151)   PRN Meds:.acetaminophen, albuterol, diphenhydrAMINE, HYDROmorphone (DILAUDID) injection, LORazepam **OR** LORazepam, melatonin, naloxone **AND** sodium chloride flush, ondansetron, oxyCODONE, polyethylene glycol  Consultants: Interventional radiology  Procedures: None  Antibiotics: None  Assessment/Plan: Principal Problem:   Sickle cell pain crisis ([place]) Active Problems:   Bipolar and related disorder ([facility])   Chronic anemia   Chronic pain   Tobacco abuse   Alcohol abuse  Sickle cell disease  with pain crisis: Patient is currently holding in the ER.  While in ER, Dilaudid 2 mg every 2 hours as needed Oxycodone 15 mg every 6 hours as needed OxyContin 10 mg every 12 hours Will initiate IV Dilaudid PCA when patient transitions to inpatient unit. Monitor vital signs very closely and reevaluate pain scale regularly  Right IJ thrombosis: Patient has a history of pulmonary embolism that was diagnosed 3 months ago.  She has been nonadherent with DOAC home Eliquis.  We will continue heparin drip.  IR consulted to assist with management.  Bipolar disorder: Continue home medications.  No suicidal homicidal intentions today  Tobacco use disorder: Patient counseled at length on the dangers of smoking especially in the setting of sickle cell disease.  Alcohol use disorder: No evidence of alcohol withdrawal at this time.  Continue CIWA protocol.  Anemia of chronic disease: Patient's hemoglobin is stable and consistent with her baseline.  There is no clinical indication for blood transfusion at this time.  Code Status: Full Code Family Communication: N/A Disposition Plan: Not yet ready for discharge  Tassie Pollett Al Decant  APRN, MSN, FNP-C [facility] [place], [place] [address] [phone]  If 7PM-7AM, please contact night-coverage.  05/27/2022, 4:06 PM  LOS: 1 day

## 2022-05-27 NOTE — ED Notes (Signed)
Patient has  Right subclavian port , patient complains of pain and swelling to the site radiating to the right side of her neck . MD Made aware.

## 2022-05-27 NOTE — Progress Notes (Signed)
ANTICOAGULATION CONSULT NOTE - Follow Up Consult  Pharmacy Consult for Heparin Indication:  PE + new IJ thrombus  Allergies  Allergen Reactions   Latex Rash   Wound Dressing Adhesive Rash    Patient Measurements: Height: '5\' 2"'$  (157.5 cm) Weight: 59 kg (130 lb) IBW/kg (Calculated) : 50.1 Heparin Dosing Weight: 59 kg  Vital Signs: Temp: 98.5 F (36.9 C) (10/25 1730) Temp Source: Oral (10/25 1730) BP: 112/73 (10/25 1730) Pulse Rate: 72 (10/25 1730)  Labs: Recent Labs    05/26/22 1931 05/26/22 2237 05/26/22 2238 05/27/22 0500 05/27/22 1700  HGB 8.3*  --   --   --  7.5*  HCT 23.8*  --   --   --  22.0*  PLT 369  --   --   --  363  APTT 35  --   --   --   --   LABPROT  --   --  14.5  --   --   INR  --   --  1.1  --   --   HEPARINUNFRC  --  <0.10*  --  0.19* 0.38  CREATININE 0.43*  --   --  0.31*  --      Estimated Creatinine Clearance: 82.8 mL/min (A) (by C-G formula based on SCr of 0.31 mg/dL (L)).   Assessment: 70 yoF with new IJ thrombus at site of port-A-cath.  PMH significant for sickle cell disease, anemia of chronic illness, and opioid dependency, PE in Sept 2023 for which she was started on apixaban. Patient states last apixaban dose 10/23 PM however heparin level <0.1 so patient likely non-compliant with medication.  Baseline labs: CBC: Hg low/stable at patient's baseline, Pltc WNL, aptt/heparin level at baseline  Today, 05/27/2022: Heparin level therapeutic after increasing rate to 1250 units/hr Hgb low consistent with sickle-cell disease (baseline hgb 7.5-9.5; crises down to 5.5 over the past year); Plt stable WNL SCr WNL No bleeding or infusion issues per RN  Goal of Therapy:  Heparin level 0.3-0.7 units/ml Monitor platelets by anticoagulation protocol: Yes   Plan:  Continue IV heparin at 1250 units/hr Daily HL and CBC F/u transition back to DOAC - will need to determine if non-compliance is a result of any modifiable factors (cost, convenience,  etc) such that alternative therapy would be appropriate   Laurie Lovejoy A 05/27/2022,7:48 PM

## 2022-05-27 NOTE — Progress Notes (Signed)
ANTICOAGULATION CONSULT NOTE - Follow Up Consult  Pharmacy Consult for Heparin Indication:  PE + new IJ thrombus  Allergies  Allergen Reactions   Latex Rash   Wound Dressing Adhesive Rash    Patient Measurements: Height: '5\' 2"'$  (157.5 cm) Weight: 59 kg (130 lb) IBW/kg (Calculated) : 50.1 Heparin Dosing Weight: 59 kg  Vital Signs: Temp: 98 F (36.7 C) (10/25 0436) Temp Source: Oral (10/25 0436) BP: 120/61 (10/25 0800) Pulse Rate: 74 (10/25 0800)  Labs: Recent Labs    05/26/22 1931 05/26/22 2237 05/26/22 2238 05/27/22 0500  HGB 8.3*  --   --   --   HCT 23.8*  --   --   --   PLT 369  --   --   --   APTT 35  --   --   --   LABPROT  --   --  14.5  --   INR  --   --  1.1  --   HEPARINUNFRC  --  <0.10*  --  0.19*  CREATININE 0.43*  --   --  0.31*    Estimated Creatinine Clearance: 82.8 mL/min (A) (by C-G formula based on SCr of 0.31 mg/dL (L)).   Assessment:  AC/Heme: recent PE (9/23) suppose to be on apixaban- LD 10/23 PM but HL<0.1 with noncompliance. Admits to skipping her doses of Eliquis. - Hep level 0.19 still low. CBC not done yet this AM. Known ACD  Goal of Therapy:  Heparin level 0.3-0.7 units/ml Monitor platelets by anticoagulation protocol: Yes   Plan:  Increase IV heparin to 1250 units/hr Recheck HL in 6 hrs. Daily HL and CBC IR consulted   Sandra Mckay, PharmD, BCPS Clinical Staff Pharmacist [email or web address] Alford Mckay, [place] 05/27/2022,8:32 AM

## 2022-05-28 ENCOUNTER — Encounter: Payer: Medicaid Other | Admitting: Clinical

## 2022-05-28 DIAGNOSIS — D5700 Hb-SS disease with crisis, unspecified: Secondary | ICD-10-CM | POA: Diagnosis not present

## 2022-05-28 LAB — CBC
HCT: 20.7 % — ABNORMAL LOW (ref 36.0–46.0)
Hemoglobin: 7.3 g/dL — ABNORMAL LOW (ref 12.0–15.0)
MCH: 36 pg — ABNORMAL HIGH (ref 26.0–34.0)
MCHC: 35.3 g/dL (ref 30.0–36.0)
MCV: 102 fL — ABNORMAL HIGH (ref 80.0–100.0)
Platelets: 331 10*3/uL (ref 150–400)
RBC: 2.03 MIL/uL — ABNORMAL LOW (ref 3.87–5.11)
RDW: 20.5 % — ABNORMAL HIGH (ref 11.5–15.5)
WBC: 9 10*3/uL (ref 4.0–10.5)
nRBC: 0.4 % — ABNORMAL HIGH (ref 0.0–0.2)

## 2022-05-28 LAB — HEPARIN LEVEL (UNFRACTIONATED): Heparin Unfractionated: 0.35 IU/mL (ref 0.30–0.70)

## 2022-05-28 MED ORDER — LIP MEDEX EX OINT
TOPICAL_OINTMENT | CUTANEOUS | Status: DC | PRN
  Filled 2022-05-28: qty 7

## 2022-05-28 NOTE — Progress Notes (Signed)
Subjective: Sandra Mckay is a 26 year old female with a medical history significant for sickle cell disease, chronic pain syndrome, opiate dependence and tolerance, history of PE on Eliquis, history of anemia of chronic disease was admitted for right IJ thrombosis and sickle cell pain crisis  Sandra Mckay is complaining of pain primarily to chest, back, and lower extremities.  She also endorses right neck pain.  Patient states that pain intensity has improved some overnight.  She rates her pain as 7/10.  Patient assessed by interventional radiology, scheduled to have port removed in AM. She denies headache, shortness of breath, urinary symptoms, nausea, vomiting, or diarrhea.  Objective:  Vital signs in last 24 hours:  Vitals:   05/28/22 0130 05/28/22 0406 05/28/22 0639 05/28/22 0845  BP: 115/69  129/81 112/74  Pulse: 82  81 83  Resp:  '16 18 16  '$ Temp: 98.9 F (37.2 C)  98.6 F (37 C) 97.9 F (36.6 C)  TempSrc: Oral  Oral Oral  SpO2: 97% 97% 99% 96%  Weight:      Height:        Intake/Output from previous day:   Intake/Output Summary (Last 24 hours) at 05/28/2022 1041 Last data filed at 05/27/2022 2205 Gross per 24 hour  Intake 1989.92 ml  Output --  Net 1989.92 ml    Physical Exam: General: Alert, awake, oriented x3, in no acute distress.  HEENT: [place]/AT PEERL, EOMI Neck: Trachea midline,  no masses, no thyromegal,y no JVD, no carotid bruit OROPHARYNX:  Moist, No exudate/ erythema/lesions.  Heart: Regular rate and rhythm, without murmurs, rubs, gallops, PMI non-displaced, no heaves or thrills on palpation.  Lungs: Clear to auscultation, no wheezing or rhonchi noted. No increased vocal fremitus resonant to percussion  Abdomen: Soft, nontender, nondistended, positive bowel sounds, no masses no hepatosplenomegaly noted..  Neuro: No focal neurological deficits noted cranial nerves II through XII grossly intact. DTRs 2+ bilaterally upper and lower extremities. Strength 5 out of 5  in bilateral upper and lower extremities. Musculoskeletal: No warm swelling or erythema around joints, no spinal tenderness noted. Psychiatric: Patient alert and oriented x3, good insight and cognition, good recent to remote recall. Lymph node survey: No cervical axillary or inguinal lymphadenopathy noted.  Lab Results:  Basic Metabolic Panel:    Component Value Date/Time   NA 135 05/27/2022 0500   NA 138 01/14/2022 1136   K 3.9 05/27/2022 0500   CL 105 05/27/2022 0500   CO2 23 05/27/2022 0500   BUN 6 05/27/2022 0500   BUN 9 01/14/2022 1136   CREATININE 0.31 (L) 05/27/2022 0500   CREATININE 0.53 05/31/2017 1145   GLUCOSE 95 05/27/2022 0500   CALCIUM 8.6 (L) 05/27/2022 0500   CBC:    Component Value Date/Time   WBC 9.0 05/28/2022 0436   HGB 7.3 (L) 05/28/2022 0436   HGB 9.5 (L) 01/14/2022 1136   HCT 20.7 (L) 05/28/2022 0436   HCT 26.9 (L) 01/14/2022 1136   PLT 331 05/28/2022 0436   PLT 452 (H) 01/14/2022 1136   MCV 102.0 (H) 05/28/2022 0436   MCV 101 (H) 01/14/2022 1136   NEUTROABS 5.1 05/26/2022 1931   NEUTROABS 6.1 01/14/2022 1136   LYMPHSABS 2.3 05/26/2022 1931   LYMPHSABS 1.9 01/14/2022 1136   MONOABS 1.1 (H) 05/26/2022 1931   EOSABS 0.1 05/26/2022 1931   EOSABS 0.1 01/14/2022 1136   BASOSABS 0.0 05/26/2022 1931   BASOSABS 0.1 01/14/2022 1136    No results found for this or any previous visit (from the past  240 hour(s)).  Studies/Results: CT Chest W Contrast  Result Date: 05/26/2022 CLINICAL DATA:  Chest wall pain, infection or inflammation suspected. Right-sided chest pain with difficulty swallowing, and neck pain. Concern for chest port complication. Sickle cell crisis. EXAM: CT CHEST WITH CONTRAST TECHNIQUE: Multidetector CT imaging of the chest was performed during intravenous contrast administration. RADIATION DOSE REDUCTION: This exam was performed according to the departmental dose-optimization program which includes automated exposure control, adjustment  of the mA and/or kV according to patient size and/or use of iterative reconstruction technique. CONTRAST:  46m OMNIPAQUE IOHEXOL 300 MG/ML  SOLN COMPARISON:  04/14/2022. FINDINGS: Cardiovascular: The heart is mildly enlarged and there is no pericardial effusion. The aorta and and pulmonary trunk are normal in caliber. A right chest wall port is noted with looping in the region of the insertion site in the internal jugular vein with retraction. There is suggestion of edema at the level of insertion in the internal jugular vein with mild mass effect upon the thyroid gland and trachea. The chest port terminates in the superior vena cava. Mediastinum/Nodes: No mediastinal, hilar, or axillary lymphadenopathy. The thyroid gland, trachea, and esophagus are within normal limits. Lungs/Pleura: Strandy opacities are present at the lung bases. No consolidation, effusion, or pneumothorax. Upper Abdomen: The gallbladder is surgically absent. No acute abnormality. Musculoskeletal: No acute or suspicious osseous abnormality. IMPRESSION: 1. Right chest port with interval looping in the region of the insertion in the internal jugular vein with retraction. Mild edema is noted adjacent to the catheter loop with mild mass effect on the trachea and thyroid gland. Chest port terminates in the superior vena cava. Please see CT neck for additional information. 2. Mild cardiomegaly. 3. Strandy atelectasis or infiltrate at the lung bases. Electronically Signed   By: LBrett FairyM.D.   On: 05/26/2022 21:57   CT Soft Tissue Neck W Contrast  Result Date: 05/26/2022 CLINICAL DATA:  Soft tissue swelling, infection suspected. Right-sided neck pain. Difficulty swallowing. EXAM: CT NECK WITH CONTRAST TECHNIQUE: Multidetector CT imaging of the neck was performed using the standard protocol following the bolus administration of intravenous contrast. RADIATION DOSE REDUCTION: This exam was performed according to the departmental  dose-optimization program which includes automated exposure control, adjustment of the mA and/or kV according to patient size and/or use of iterative reconstruction technique. CONTRAST:  840mOMNIPAQUE IOHEXOL 300 MG/ML  SOLN COMPARISON:  None Available. FINDINGS: Pharynx and larynx: Mild prominence of lingual tonsils and palatine tonsils present bilaterally without focal mass lesion. Nasopharynx is clear. Vallecula and epiglottis are within normal limits. Aryepiglottic folds and piriform sinuses are clear. Vocal cords are midline and symmetric. Trachea is clear. Salivary glands: The submandibular and parotid glands and ducts are within normal limits. Thyroid: Normal Lymph nodes: Multiple subcentimeter nodes are present bilaterally. No hyperdense rounded nodes are present. Vascular: The right IJ Port-A-Cath is now twisted on itself within the right internal jugular vein. Thrombus is noted at the twist in the catheter extending inferiorly in the right internal jugular vein. The SVC is patent. Mild inflammatory changes are present within the right carotid canal and surrounding the thrombosed vein. Limited intracranial: Within normal limits. Visualized orbits: The globes and orbits are within normal limits. Mastoids and visualized paranasal sinuses: The paranasal sinuses and mastoid air cells are clear. Skeleton: 2 body heights alignment are normal. No focal osseous lesions are present. Upper chest: The lung apices are clear. Thoracic inlet is within normal limits. IMPRESSION: 1. The right IJ Port-A-Cath is now twisted  on itself within the right internal jugular vein. 2. Thrombus is noted at the twist in the catheter extending inferiorly in the right internal jugular vein. The SVC is patent. 3. Mild inflammatory changes within the right carotid canal and surrounding the thrombosed vein. 4. Mild prominence of lingual tonsils and palatine tonsils bilaterally without focal mass lesion. This is likely reactive. Critical  Value/emergent results were called by telephone at the time of interpretation on 05/26/2022 at 9:54 pm to provider [facility] , who verbally acknowledged these results. Electronically Signed   By: San Morelle M.D.   On: 05/26/2022 21:55   DG Chest Port 1 View  Result Date: 05/26/2022 CLINICAL DATA:  Chest pain. Sickle cell pain in both legs and hips for 3 days. Right chest port with swelling of the right collar bone area. EXAM: PORTABLE CHEST 1 VIEW COMPARISON:  04/14/2022 FINDINGS: A power port type right central venous catheter is present. Since the prior study, the tip has retracted to the level of the mid SVC and a loop is present at the expected vascular insertion site suggesting that the catheter has backed out since prior study. Heart size and pulmonary vascularity are normal. Lungs are clear. No pleural effusions. No pneumothorax. IMPRESSION: 1. Indwelling right central venous catheter appears to have retracted since the prior study with a loop suggested at the expected vascular insertion site. 2. No evidence of active pulmonary disease. Electronically Signed   By: Lucienne Capers M.D.   On: 05/26/2022 19:08    Medications: Scheduled Meds:  folic acid  1 mg Oral Daily   HYDROmorphone   Intravenous Q4H   hydroxyurea  1,500 mg Oral Daily   multivitamin with minerals  1 tablet Oral Daily   nicotine  14 mg Transdermal Daily   oxyCODONE  10 mg Oral Q12H   QUEtiapine  25 mg Oral QHS   senna-docusate  1 tablet Oral QHS   thiamine  100 mg Oral Daily   Or   thiamine  100 mg Intravenous Daily   Continuous Infusions:  sodium chloride 75 mL/hr at 05/27/22 1742   heparin 1,250 Units/hr (05/27/22 1804)   PRN Meds:.acetaminophen, albuterol, diphenhydrAMINE, lip balm, LORazepam **OR** LORazepam, melatonin, naloxone **AND** sodium chloride flush, ondansetron, oxyCODONE, polyethylene glycol  Consultants: Interventional  radiology  Procedures: None  Antibiotics: None  Assessment/Plan: Principal Problem:   Sickle cell pain crisis ([place]) Active Problems:   Bipolar and related disorder (HCC)   Chronic anemia   Chronic pain   Tobacco abuse   Alcohol abuse  Sickle cell disease with pain crisis: Patient is currently holding in the ER.  While in ER, Dilaudid 2 mg every 2 hours as needed Oxycodone 15 mg every 6 hours as needed OxyContin 10 mg every 12 hours Will initiate IV Dilaudid PCA when patient transitions to inpatient unit. Monitor vital signs very closely and reevaluate pain scale regularly  Right IJ thrombosis: Patient has a history of pulmonary embolism that was diagnosed 3 months ago.  She has been nonadherent with DOAC home Eliquis.  We will continue heparin drip.  IR consulted, scheduled to remove port in a.m.  Bipolar disorder: Continue home medications.  No suicidal homicidal intentions today  Tobacco use disorder: Patient counseled at length on the dangers of smoking especially in the setting of sickle cell disease.  Alcohol use disorder: No evidence of alcohol withdrawal at this time.  Continue CIWA protocol.  Anemia of chronic disease: Patient's hemoglobin is stable and consistent with her baseline.  There is no clinical indication for blood transfusion at this time.  Code Status: Full Code Family Communication: N/A Disposition Plan: Not yet ready for discharge  [place], MSN, FNP-C [facility] [address] [place], [place] [address] [phone]  If 7PM-7AM, please contact night-coverage.  05/28/2022, 10:41 AM  LOS: 2 days

## 2022-05-28 NOTE — Progress Notes (Signed)
Mobility Specialist - Progress Note   05/28/22 1115  Mobility  Activity Ambulated with assistance in hallway  Level of Assistance Independent  Assistive Device  (IV Pole)  Distance Ambulated (ft) 700 ft  Activity Response Tolerated well  Mobility Referral Yes  $Mobility charge 1 Mobility   Pt received in bed and agreeable to mobility. No complaints during mobility. Pt to bed after session with all needs met.    [facility]

## 2022-05-28 NOTE — BH Specialist Note (Signed)
Integrated Behavioral Health General Follow Up Note  05/28/2022 Name: Sandra Mckay MRN: [id number] DOB: 02/07/1996 [name] is a 26 y.o. year old female who sees Dorena Dew, FNP for primary care. LCSW was initially consulted to assist the patient with mental health counseling and resources.   Interpreter: No.   Interpreter Name & Language: none  Assessment: Patient experiencing anxiety and bipolar disorder.  Ongoing Intervention: Today [name] met with patient at bedside in [place]. Had an outpatient [facility] (IBH) appointment with patient scheduled for today; will meet with patient again 06/02/22 in office. Supportive counseling provided at bedside today and processed emotions related to health condition.   Review of patient status, including review of consultants reports, relevant laboratory and other test results, and collaboration with appropriate care team members and the patient's provider was performed as part of comprehensive patient evaluation and provision of services.    [name], [facility] [phone]

## 2022-05-28 NOTE — Plan of Care (Signed)
  Problem: Education: Goal: Knowledge of vaso-occlusive preventative measures will improve Outcome: Progressing Goal: Awareness of infection prevention will improve Outcome: Progressing   Problem: Self-Care: Goal: Ability to incorporate actions that prevent/reduce pain crisis will improve Outcome: Progressing   Problem: Tissue Perfusion: Goal: Complications related to inadequate tissue perfusion will be avoided or minimized Outcome: Progressing

## 2022-05-28 NOTE — Progress Notes (Signed)
ANTICOAGULATION CONSULT NOTE - Follow Up Consult  Pharmacy Consult for Heparin Indication:  PE + new IJ thrombus  Allergies  Allergen Reactions   Latex Rash   Wound Dressing Adhesive Rash    Patient Measurements: Height: '5\' 2"'$  (157.5 cm) Weight: 59 kg (130 lb) IBW/kg (Calculated) : 50.1 Heparin Dosing Weight: 59 kg  Vital Signs: Temp: 98.6 F (37 C) (10/26 0639) Temp Source: Oral (10/26 0639) BP: 129/81 (10/26 0639) Pulse Rate: 81 (10/26 0639)  Labs: Recent Labs    05/26/22 1931 05/26/22 2237 05/26/22 2238 05/27/22 0500 05/27/22 1700 05/28/22 0436  HGB 8.3*  --   --   --  7.5* 7.3*  HCT 23.8*  --   --   --  22.0* 20.7*  PLT 369  --   --   --  363 331  APTT 35  --   --   --   --   --   LABPROT  --   --  14.5  --   --   --   INR  --   --  1.1  --   --   --   HEPARINUNFRC  --    < >  --  0.19* 0.38 0.35  CREATININE 0.43*  --   --  0.31*  --   --    < > = values in this interval not displayed.     Estimated Creatinine Clearance: 82.8 mL/min (A) (by C-G formula based on SCr of 0.31 mg/dL (L)).   Assessment: 56 yoF with new IJ thrombus at site of port-A-cath.  PMH significant for sickle cell disease, anemia of chronic illness, and opioid dependency, PE in Sept 2023 for which she was started on apixaban. Patient states last apixaban dose 10/23 PM however heparin level <0.1 so patient likely non-compliant with medication.  Baseline labs: CBC: Hg low/stable at patient's baseline, Pltc WNL, aptt/heparin level at baseline  Today, 05/28/2022: Heparin level 0.35 remains therapeutic on heparin drip @ 1250 units/hr Hgb low @ 7.3 but consistent with sickle-cell disease (baseline hgb 7.5-9.5; crises down to 5.5 over the past year); Plt stable WNL SCr WNL No bleeding or infusion issues per RN  Goal of Therapy:  Heparin level 0.3-0.7 units/ml Monitor platelets by anticoagulation protocol: Yes   Plan:  Continue IV heparin at 1250 units/hr Daily HL and CBC F/u transition  back to [place] after possible intervention by IR for R IJ thrombus   Eudelia Bunch, Pharm.D 05/28/2022 7:54 AM

## 2022-05-28 NOTE — Progress Notes (Signed)
Mobility Specialist - Progress Note   05/28/22 1457  Mobility  Activity Ambulated with assistance in hallway  Level of Assistance Independent after set-up  Assistive Device  (IV Pole)  Distance Ambulated (ft) 500 ft  Activity Response Tolerated well  Mobility Referral Yes  $Mobility charge 1 Mobility   Pt received in bed and agreeable to mobility. No complaints during mobility session. Pt to bed after session with all needs met.    [facility]

## 2022-05-28 NOTE — TOC Initial Note (Signed)
Transition of Care [facility]) - Initial/Assessment Note    Patient Details  Name: Sandra Mckay MRN: [id number] Date of Birth: Mar 11, 1996  [facility]) CM/SW Contact:    Leeroy Cha, RN Phone Number: 05/28/2022, 7:47 AM  Clinical Narrative:                  Transition of Care [facility]) Screening Note   Patient Details  Name: Sandra Mckay Date of Birth: 1995-10-01   Transition of Care [facility]) CM/SW Contact:    Leeroy Cha, RN Phone Number: 05/28/2022, 7:47 AM    Transition of Care Department [facility]) has reviewed patient and no TOC needs have been identified at this time. We will continue to monitor patient advancement through interdisciplinary progression rounds. If new patient transition needs arise, please place a TOC consult.    Expected Discharge Plan: Home/Self Care Barriers to Discharge: Continued Medical Work up   Patient Goals and CMS Choice Patient states their goals for this hospitalization and ongoing recovery are:: to quit hurting CMS Medicare.gov Compare Post Acute Care list provided to:: Patient    Expected Discharge Plan and Services Expected Discharge Plan: Home/Self Care   Discharge Planning Services: CM Consult   Living arrangements for the past 2 months: Single Family Home                                      Prior Living Arrangements/Services Living arrangements for the past 2 months: Single Family Home Lives with:: Self Patient language and need for interpreter reviewed:: Yes Do you feel safe going back to the place where you live?: Yes            Criminal Activity/Legal Involvement Pertinent to Current Situation/Hospitalization: No - Comment as needed  Activities of Daily Living Home Assistive Devices/Equipment: Contact lenses ADL Screening (condition at time of admission) Patient's cognitive ability adequate to safely complete daily activities?: Yes Is the patient deaf or have difficulty hearing?:  No Does the patient have difficulty seeing, even when wearing glasses/contacts?: Yes Does the patient have difficulty concentrating, remembering, or making decisions?: Yes (trouble with memory) Patient able to express need for assistance with ADLs?: Yes Does the patient have difficulty dressing or bathing?: No Independently performs ADLs?: Yes (appropriate for developmental age) Does the patient have difficulty walking or climbing stairs?: Yes Weakness of Legs: Both Weakness of Arms/Hands: Both  Permission Sought/Granted                  Emotional Assessment Appearance:: Appears stated age Attitude/Demeanor/Rapport: Engaged Affect (typically observed): Calm Orientation: : Oriented to Self, Oriented to Place, Oriented to  Time, Oriented to Situation Alcohol / Substance Use: Tobacco Use Psych Involvement: No (comment)  Admission diagnosis:  Sickle cell pain crisis ([place]) [D57.00] Acute deep vein thrombosis (DVT) of non-extremity vein [I82.90] Patient Active Problem List   Diagnosis Date Noted   Alcohol abuse 05/27/2022   Acute chest syndrome ([place])    Pulmonary embolism ([place]) 04/14/2022   Sickle cell pain crisis ([place]) 03/26/2022   Hypokalemia 10/29/2021   Lumbar vertebral fracture ([place]) 06/16/2021   Chronic anemia 06/10/2021   Influenza A 06/10/2021   Cocaine use 10/15/2020   S/P cesarean section 01/09/2019   Yeast vaginitis 37/16/9678   Umbilical vein abnormality affecting pregnancy 12/29/2018   Medication management 12/12/2018   Anxiety and depression 07/15/2018   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 07/15/2018   Supervision of high risk pregnancy in third trimester 07/15/2018   Hyperbilirubinemia 12/25/2017   Leukocytosis 12/25/2017   Menorrhagia 12/25/2017   Thrombocytosis 12/25/2017   Tobacco abuse 12/25/2017   Bipolar and related disorder ([place]) 04/04/2017   Vasovagal syncope 09/18/2016   Closed  fracture of lumbar vertebra without spinal cord injury ([place]) 07/15/2016   Sickle cell anemia ([place]) 06/08/2015   Adnexal mass 05/29/2014   Vitamin D deficiency 12/22/2011   Chronic pain 12/21/2011   Patent foramen ovale 12/21/2011   Heart murmur 10/07/2011   Pulmonary hypertension ([place]) 10/07/2011   PCP:  Dorena Dew, FNP Pharmacy:   [facility] [place] [place] [address] [employer] [address] [place] [address] Phone: [phone] Fax: [phone]  [place], [place] [address] [place] [address] Phone: [phone] Fax: [phone]  [id number] [place] [place]Phone: [phone]: [phone]    Social Determinants of Health (SDOH) Interventions    Readmission Risk Interventions   No data to display

## 2022-05-29 ENCOUNTER — Inpatient Hospital Stay (HOSPITAL_COMMUNITY): Payer: Medicaid Other

## 2022-05-29 DIAGNOSIS — D5700 Hb-SS disease with crisis, unspecified: Secondary | ICD-10-CM | POA: Diagnosis not present

## 2022-05-29 HISTORY — PX: IR REMOVAL TUN ACCESS W/ PORT W/O FL MOD SED: IMG2290

## 2022-05-29 LAB — CBC
HCT: 23.6 % — ABNORMAL LOW (ref 36.0–46.0)
Hemoglobin: 8.2 g/dL — ABNORMAL LOW (ref 12.0–15.0)
MCH: 35.3 pg — ABNORMAL HIGH (ref 26.0–34.0)
MCHC: 34.7 g/dL (ref 30.0–36.0)
MCV: 101.7 fL — ABNORMAL HIGH (ref 80.0–100.0)
Platelets: 387 10*3/uL (ref 150–400)
RBC: 2.32 MIL/uL — ABNORMAL LOW (ref 3.87–5.11)
RDW: 20.2 % — ABNORMAL HIGH (ref 11.5–15.5)
WBC: 11.1 10*3/uL — ABNORMAL HIGH (ref 4.0–10.5)
nRBC: 0.3 % — ABNORMAL HIGH (ref 0.0–0.2)

## 2022-05-29 LAB — HEPARIN LEVEL (UNFRACTIONATED): Heparin Unfractionated: 0.28 IU/mL — ABNORMAL LOW (ref 0.30–0.70)

## 2022-05-29 MED ORDER — FENTANYL CITRATE (PF) 100 MCG/2ML IJ SOLN
INTRAMUSCULAR | Status: AC
  Filled 2022-05-29: qty 2

## 2022-05-29 MED ORDER — MIDAZOLAM HCL 2 MG/2ML IJ SOLN
INTRAMUSCULAR | Status: AC
  Filled 2022-05-29: qty 2

## 2022-05-29 MED ORDER — LIDOCAINE-EPINEPHRINE 1 %-1:100000 IJ SOLN
INTRAMUSCULAR | Status: AC
  Administered 2022-05-29: 20 mL via INTRADERMAL
  Filled 2022-05-29: qty 1

## 2022-05-29 MED ORDER — RIVAROXABAN 20 MG PO TABS
20.0000 mg | ORAL_TABLET | Freq: Every day | ORAL | Status: DC
  Administered 2022-05-29 – 2022-05-30 (×2): 20 mg via ORAL
  Filled 2022-05-29 (×2): qty 1

## 2022-05-29 MED ORDER — MIDAZOLAM HCL 2 MG/2ML IJ SOLN
INTRAMUSCULAR | Status: AC | PRN
  Administered 2022-05-29: .5 mg via INTRAVENOUS
  Administered 2022-05-29: 1 mg via INTRAVENOUS

## 2022-05-29 NOTE — Progress Notes (Addendum)
ANTICOAGULATION CONSULT NOTE - Follow Up Consult  Pharmacy Consult for Heparin Indication:  PE + new IJ thrombus  Allergies  Allergen Reactions   Latex Rash   Wound Dressing Adhesive Rash    Patient Measurements: Height: '5\' 2"'$  (157.5 cm) Weight: 59 kg (130 lb) IBW/kg (Calculated) : 50.1 Heparin Dosing Weight: 59 kg  Vital Signs: Temp: 98.7 F (37.1 C) (10/27 1234) Temp Source: Oral (10/27 1234) BP: 114/69 (10/27 1234) Pulse Rate: 90 (10/27 1234)  Labs: Recent Labs    05/26/22 1931 05/26/22 2237 05/26/22 2238 05/27/22 0500 05/27/22 1700 05/28/22 0436 05/29/22 0514  HGB 8.3*  --   --   --  7.5* 7.3* 8.2*  HCT 23.8*  --   --   --  22.0* 20.7* 23.6*  PLT 369  --   --   --  363 331 387  APTT 35  --   --   --   --   --   --   LABPROT  --   --  14.5  --   --   --   --   INR  --   --  1.1  --   --   --   --   HEPARINUNFRC  --    < >  --  0.19* 0.38 0.35 0.28*  CREATININE 0.43*  --   --  0.31*  --   --   --    < > = values in this interval not displayed.     Estimated Creatinine Clearance: 82.8 mL/min (A) (by C-G formula based on SCr of 0.31 mg/dL (L)).   Assessment: 32 yoF with new IJ thrombus at site of port-A-cath.  PMH significant for sickle cell disease, anemia of chronic illness, and opioid dependency, PE in Sept 2023 for which she was started on apixaban. Patient states last apixaban dose 10/23 PM; however, baseline heparin level < 0.1 units/mL, so patient likely non-compliant with medication.  Baseline labs: CBC: Hgb low/stable at patient's baseline, Pltc WNL, INR 1.1, aPTT 35 seconds, heparin level < 0.1 units/mL  Today, 05/29/2022: Heparin level 0.28 units/mL, now slightly subtherapeutic on on heparin infusion @ 1250 units/hr Hgb low at 8.2 but consistent with sickle-cell disease (baseline hgb 7.5-9.5; crises down to 5.5 over the past year); Pltc stable/WNL SCr low No bleeding or infusion issues per RN  Goal of Therapy:  Heparin level 0.3-0.7  units/ml Monitor platelets by anticoagulation protocol: Yes   Plan:  Increase IV heparin infusion to 1350 units/hr Heparin level 6 hours after rate change Daily heparin level and CBC Monitor closely for s/sx of bleeding Per provider notes, patient to have port removed today.  IR: please indicate when IV heparin needs to be held prior to procedure F/u for transition back to [place] after procedure   Lindell Spar, PharmD, BCPS Clinical Pharmacist  05/29/2022 12:50 PM  Addendum: Heparin held 2355 am for IR PAC removed Heparin resumed at 1115 per IR at prior rate of 1350 units/hr Will check heparin level 6 hours after resumed Continue daily heparin level & CBC ? Transition back to oral DOAC ? Transition from Eliquis to Xarelto for once daily dosing? If treating as new clot will need  Xarelto 21 day 15 mg po  BID loading dose followed by xarelto 20 mg qday maintenance dose  Eudelia Bunch, Pharm.D 05/29/2022 12:53 PM  Addendum: To transition to Xarelto. Per discussion with Sickle Cell NP she will skip the Xarelto treatment loading dose (got Eliquis  loading dose in September) - the twice daily dosing may have been a barrier to compliance  Plan: Continue heparin drip until 1700, then DC drip & start Xarelto 20 qsupper Will provide pt with 30 day free card & education  Eudelia Bunch, Pharm.D  05/29/2022 1:27 PM

## 2022-05-29 NOTE — Discharge Instructions (Signed)
Information on my medicine - XARELTO (rivaroxaban)  This medication education was reviewed with me or my healthcare representative as part of my discharge preparation.  T WHY WAS [place] YOU? Xarelto was prescribed to treat blood clots that may have been found in the veins of your legs (deep vein thrombosis) or in your lungs (pulmonary embolism) and to reduce the risk of them occurring again.  What do you need to know about Xarelto? The dose is one 20 mg tablet taken ONCE A DAY with your evening meal.  DO NOT stop taking Xarelto without talking to the health care provider who prescribed the medication.  Refill your prescription for 20 mg tablets before you run out.  After discharge, you should have regular check-up appointments with your healthcare provider that is prescribing your Xarelto.  In the future your dose may need to be changed if your kidney function changes by a significant amount.  What do you do if you miss a dose? If you are taking Xarelto TWICE DAILY and you miss a dose, take it as soon as you remember. You may take two 15 mg tablets (total 30 mg) at the same time then resume your regularly scheduled 15 mg twice daily the next day.  If you are taking Xarelto ONCE DAILY and you miss a dose, take it as soon as you remember on the same day then continue your regularly scheduled once daily regimen the next day. Do not take two doses of Xarelto at the same time.   Important Safety Information Xarelto is a blood thinner medicine that can cause bleeding. You should call your healthcare provider right away if you experience any of the following: Bleeding from an injury or your nose that does not stop. Unusual colored urine (red or dark brown) or unusual colored stools (red or black). Unusual bruising for unknown reasons. A serious fall or if you hit your head (even if there is no bleeding).  Some medicines may interact with Xarelto and might increase your  risk of bleeding while on Xarelto. To help avoid this, consult your healthcare provider or pharmacist prior to using any new prescription or non-prescription medications, including herbals, vitamins, non-steroidal anti-inflammatory drugs (NSAIDs) and supplements.  This website has more information on Xarelto: [email or web address]-[email or web address].[email or web address]

## 2022-05-29 NOTE — Progress Notes (Signed)
Mobility Specialist - Progress Note   05/29/22 1316  Mobility  Activity Ambulated with assistance in hallway  Level of Assistance Independent  Assistive Device  (IV Pole)  Distance Ambulated (ft) 500 ft  Activity Response Tolerated well  Mobility Referral Yes  $Mobility charge 1 Mobility   Pt received in bed and agreeable to mobility. No complaints during mobility. Pt to bed after session with all needs met.    [facility]

## 2022-05-29 NOTE — Progress Notes (Addendum)
ANTICOAGULATION CONSULT NOTE - Follow Up Consult  Pharmacy Consult for Heparin Indication:  PE + new IJ thrombus  Allergies  Allergen Reactions   Latex Rash   Wound Dressing Adhesive Rash    Patient Measurements: Height: '5\' 2"'$  (157.5 cm) Weight: 59 kg (130 lb) IBW/kg (Calculated) : 50.1 Heparin Dosing Weight: 59 kg  Vital Signs: Temp: 99.1 F (37.3 C) (10/27 0633) Temp Source: Oral (10/27 4827) BP: 118/66 (10/27 0786) Pulse Rate: 95 (10/27 0633)  Labs: Recent Labs    05/26/22 1931 05/26/22 2237 05/26/22 2238 05/27/22 0500 05/27/22 1700 05/28/22 0436 05/29/22 0514  HGB 8.3*  --   --   --  7.5* 7.3* 8.2*  HCT 23.8*  --   --   --  22.0* 20.7* 23.6*  PLT 369  --   --   --  363 331 387  APTT 35  --   --   --   --   --   --   LABPROT  --   --  14.5  --   --   --   --   INR  --   --  1.1  --   --   --   --   HEPARINUNFRC  --    < >  --  0.19* 0.38 0.35 0.28*  CREATININE 0.43*  --   --  0.31*  --   --   --    < > = values in this interval not displayed.     Estimated Creatinine Clearance: 82.8 mL/min (A) (by C-G formula based on SCr of 0.31 mg/dL (L)).   Assessment: 35 yoF with new IJ thrombus at site of port-A-cath.  PMH significant for sickle cell disease, anemia of chronic illness, and opioid dependency, PE in Sept 2023 for which she was started on apixaban. Patient states last apixaban dose 10/23 PM; however, baseline heparin level < 0.1 units/mL, so patient likely non-compliant with medication.  Baseline labs: CBC: Hgb low/stable at patient's baseline, Pltc WNL, INR 1.1, aPTT 35 seconds, heparin level < 0.1 units/mL  Today, 05/29/2022: Heparin level 0.28 units/mL, now slightly subtherapeutic on on heparin infusion @ 1250 units/hr Hgb low at 8.2 but consistent with sickle-cell disease (baseline hgb 7.5-9.5; crises down to 5.5 over the past year); Pltc stable/WNL SCr low No bleeding or infusion issues per RN  Goal of Therapy:  Heparin level 0.3-0.7  units/ml Monitor platelets by anticoagulation protocol: Yes   Plan:  Increase IV heparin infusion to 1350 units/hr Heparin level 6 hours after rate change Daily heparin level and CBC Monitor closely for s/sx of bleeding Per provider notes, patient to have port removed today.  IR: please indicate when IV heparin needs to be held prior to procedure F/u for transition back to [place] after procedure   Lindell Spar, PharmD, BCPS Clinical Pharmacist  05/29/2022 6:48 AM

## 2022-05-29 NOTE — Procedures (Signed)
Interventional Radiology Procedure:   Indications: Right IJ DVT and Right IJ port is coiled in IJ.  Procedure: Port removal  Findings: Complete removal of right chest port  Complications: No immediate complications noted.     EBL: Minimal  Plan: Will re-start heparin in 30 minutes   Sandra Mckay R. Anselm Pancoast, MD  Pager: [phone]

## 2022-05-29 NOTE — TOC Progression Note (Signed)
Transition of Care [facility]) - Progression Note    Patient Details  Name: Dilynn Munroe MRN: [id number] Date of Birth: Nov 18, 1995  Transition of Care [facility]) CM/SW Contact  Akeira Lahm, RN Phone Number: 05/29/2022, 12:09 PM  Clinical Narrative:     Pt was not able to speak with TOC related to just coming back from a procedure.   Expected Discharge Plan: Home/Self Care Barriers to Discharge: Continued Medical Work up  Expected Discharge Plan and Services Expected Discharge Plan: Home/Self Care   Discharge Planning Services: CM Consult   Living arrangements for the past 2 months: Single Family Home                                       Social Determinants of Health (SDOH) Interventions    Readmission Risk Interventions     No data to display

## 2022-05-29 NOTE — Progress Notes (Signed)
Mobility Specialist - Progress Note  05/29/22 0935  Mobility  Activity Ambulated with assistance in hallway  Level of Assistance Independent  Assistive Device  (IV Pole)  Distance Ambulated (ft) 275 ft  Activity Response Tolerated well  Mobility Referral Yes  $Mobility charge 1 Mobility   Pt received in bed and agreeable to mobility. No complaints during session. Session cut short due to procedure. Pt to room after session with all needs met.    [facility]

## 2022-05-29 NOTE — Progress Notes (Signed)
The patient returned from IR procedure, right port removal. Site is clean dry and intact. Pt complains of pain and anxiety. VS are stable see   05/29/22 1107  Vitals  Temp 98.7 F (37.1 C)  Temp Source Oral  BP 108/63  MAP (mmHg) 76  BP Location Right Arm  BP Method Automatic  Patient Position (if appropriate) Lying  Pulse Rate 90  Pulse Rate Source Monitor  Resp 16  MEWS COLOR  MEWS Score Color Green  Oxygen Therapy  SpO2 100 %  MEWS Score  MEWS Temp 0  MEWS Systolic 0  MEWS Pulse 0  MEWS RR 0  MEWS LOC 0  MEWS Score 0

## 2022-05-29 NOTE — Progress Notes (Signed)
Subjective: Sandra Mckay is a 26 year old female with a medical history significant for sickle cell disease, chronic pain syndrome, opiate dependence and tolerance, history of PE on Eliquis, history of anemia of chronic disease was admitted for right IJ thrombosis and sickle cell pain crisis  Patient underwent Port-A-Cath removal this a.m. without complication. Sandra Mckay is complaining of pain primarily to chest, back, and lower extremities.  She also continues to endorse right neck pain.  Patient states that pain intensity has improved some overnight.  She rates her pain as 7/10.  Patient assessed by interventional radiology, scheduled to have port removed in AM. She denies headache, shortness of breath, urinary symptoms, nausea, vomiting, or diarrhea.  Objective:  Vital signs in last 24 hours:  Vitals:   05/29/22 0440 05/29/22 0633 05/29/22 0758 05/29/22 0807  BP:  118/66  114/79  Pulse:  95  88  Resp: '17 14 12 12  '$ Temp:  99.1 F (37.3 C)    TempSrc:  Oral    SpO2: 100% 98% 96% 98%  Weight:      Height:        Intake/Output from previous day:   Intake/Output Summary (Last 24 hours) at 05/29/2022 0939 Last data filed at 05/29/2022 0700 Gross per 24 hour  Intake 2394.37 ml  Output --  Net 2394.37 ml    Physical Exam: General: Alert, awake, oriented x3, in no acute distress.  HEENT: [place]/AT PEERL, EOMI Neck: Trachea midline,  no masses, no thyromegal,y no JVD, no carotid bruit OROPHARYNX:  Moist, No exudate/ erythema/lesions.  Heart: Regular rate and rhythm, without murmurs, rubs, gallops, PMI non-displaced, no heaves or thrills on palpation.  Lungs: Clear to auscultation, no wheezing or rhonchi noted. No increased vocal fremitus resonant to percussion  Abdomen: Soft, nontender, nondistended, positive bowel sounds, no masses no hepatosplenomegaly noted..  Neuro: No focal neurological deficits noted cranial nerves II through XII grossly intact. DTRs 2+ bilaterally upper and  lower extremities. Strength 5 out of 5 in bilateral upper and lower extremities. Musculoskeletal: No warm swelling or erythema around joints, no spinal tenderness noted. Psychiatric: Patient alert and oriented x3, good insight and cognition, good recent to remote recall. Lymph node survey: No cervical axillary or inguinal lymphadenopathy noted.  Lab Results:  Basic Metabolic Panel:    Component Value Date/Time   NA 135 05/27/2022 0500   NA 138 01/14/2022 1136   K 3.9 05/27/2022 0500   CL 105 05/27/2022 0500   CO2 23 05/27/2022 0500   BUN 6 05/27/2022 0500   BUN 9 01/14/2022 1136   CREATININE 0.31 (L) 05/27/2022 0500   CREATININE 0.53 05/31/2017 1145   GLUCOSE 95 05/27/2022 0500   CALCIUM 8.6 (L) 05/27/2022 0500   CBC:    Component Value Date/Time   WBC 11.1 (H) 05/29/2022 0514   HGB 8.2 (L) 05/29/2022 0514   HGB 9.5 (L) 01/14/2022 1136   HCT 23.6 (L) 05/29/2022 0514   HCT 26.9 (L) 01/14/2022 1136   PLT 387 05/29/2022 0514   PLT 452 (H) 01/14/2022 1136   MCV 101.7 (H) 05/29/2022 0514   MCV 101 (H) 01/14/2022 1136   NEUTROABS 5.1 05/26/2022 1931   NEUTROABS 6.1 01/14/2022 1136   LYMPHSABS 2.3 05/26/2022 1931   LYMPHSABS 1.9 01/14/2022 1136   MONOABS 1.1 (H) 05/26/2022 1931   EOSABS 0.1 05/26/2022 1931   EOSABS 0.1 01/14/2022 1136   BASOSABS 0.0 05/26/2022 1931   BASOSABS 0.1 01/14/2022 1136    No results found for this or any  previous visit (from the past 240 hour(s)).  Studies/Results: No results found.  Medications: Scheduled Meds:  fentaNYL       folic acid  1 mg Oral Daily   HYDROmorphone   Intravenous Q4H   hydroxyurea  1,500 mg Oral Daily   lidocaine-EPINEPHrine       midazolam       multivitamin with minerals  1 tablet Oral Daily   nicotine  14 mg Transdermal Daily   oxyCODONE  10 mg Oral Q12H   QUEtiapine  25 mg Oral QHS   senna-docusate  1 tablet Oral QHS   thiamine  100 mg Oral Daily   Or   thiamine  100 mg Intravenous Daily   Continuous  Infusions:  heparin Stopped (05/29/22 0822)   PRN Meds:.acetaminophen, albuterol, diphenhydrAMINE, fentaNYL, lidocaine-EPINEPHrine, lip balm, LORazepam **OR** LORazepam, melatonin, midazolam, naloxone **AND** sodium chloride flush, ondansetron, oxyCODONE, polyethylene glycol  Consultants: Interventional radiology Pharmacy  Procedures: None  Antibiotics: None  Assessment/Plan: Principal Problem:   Sickle cell pain crisis ([place]) Active Problems:   Bipolar and related disorder (H[facility])   Chronic anemia   Chronic pain   Tobacco abuse   Alcohol abuse  Sickle cell disease with pain crisis: Continue IV Dilaudid PCA OxyContin 10 mg every 12 hours Oxycodone 20 mg every 6 hours as needed for severe breakthrough pain Continue to monitor vital signs very closely, reevaluate pain scale regularly, and supplemental oxygen as needed.   Right IJ thrombosis: Port-A-Cath removed this a.m. without complication.  Will discontinue heparin drip and transition to [place] per pharmacy.  We will begin maintenance dose Xarelto 20 mg daily.  Bipolar disorder: Continue home medications.  No suicidal homicidal intentions today  Tobacco use disorder: Patient counseled at length on the dangers of smoking especially in the setting of sickle cell disease.  Alcohol use disorder: No evidence of alcohol withdrawal at this time.  Continue CIWA protocol.  Anemia of chronic disease: Patient's hemoglobin is stable and consistent with her baseline.  There is no clinical indication for blood transfusion at this time.  Code Status: Full Code Family Communication: N/A Disposition Plan: Not yet ready for discharge  [place], MSN, FNP-C Patient [place] [address] [place], [place] [address] [phone]  If 7PM-7AM, please contact night-coverage.  05/29/2022, 9:39 AM  LOS: 3 days

## 2022-05-30 DIAGNOSIS — D649 Anemia, unspecified: Secondary | ICD-10-CM

## 2022-05-30 DIAGNOSIS — F1010 Alcohol abuse, uncomplicated: Secondary | ICD-10-CM

## 2022-05-30 DIAGNOSIS — D5700 Hb-SS disease with crisis, unspecified: Secondary | ICD-10-CM | POA: Diagnosis not present

## 2022-05-30 DIAGNOSIS — I8290 Acute embolism and thrombosis of unspecified vein: Secondary | ICD-10-CM | POA: Diagnosis not present

## 2022-05-30 DIAGNOSIS — F319 Bipolar disorder, unspecified: Secondary | ICD-10-CM

## 2022-05-30 DIAGNOSIS — Z720 Tobacco use: Secondary | ICD-10-CM

## 2022-05-30 LAB — URINALYSIS, ROUTINE W REFLEX MICROSCOPIC
Bilirubin Urine: NEGATIVE
Glucose, UA: NEGATIVE mg/dL
Hgb urine dipstick: NEGATIVE
Ketones, ur: NEGATIVE mg/dL
Leukocytes,Ua: NEGATIVE
Nitrite: NEGATIVE
Protein, ur: NEGATIVE mg/dL
Specific Gravity, Urine: 1.009 (ref 1.005–1.030)
pH: 8 (ref 5.0–8.0)

## 2022-05-30 MED ORDER — HYDROMORPHONE 1 MG/ML IV SOLN
INTRAVENOUS | Status: DC
  Administered 2022-05-30: 9 mg via INTRAVENOUS
  Administered 2022-05-31: 10.5 mg via INTRAVENOUS
  Administered 2022-05-31: 12.5 mg via INTRAVENOUS
  Filled 2022-05-30 (×2): qty 30

## 2022-05-30 MED ORDER — LORAZEPAM 1 MG PO TABS
1.0000 mg | ORAL_TABLET | Freq: Once | ORAL | Status: AC
  Administered 2022-05-30: 1 mg via ORAL
  Filled 2022-05-30: qty 1

## 2022-05-30 NOTE — Progress Notes (Signed)
Patient ID: Mikia Delaluz, female   DOB: July 26, 1996, 26 y.o.   MRN: [id number] Subjective: Elsbeth Yearick is a 26 year old female with a medical history significant for sickle cell disease, chronic pain syndrome, opiate dependence and tolerance, history of PE on Eliquis, history of anemia of chronic disease was admitted for right IJ thrombosis and sickle cell pain crisis.  Patient claims she is slowly getting better, better than when she came in.  She has no new complaint today.  She said her pain is at 6/10, her goal is below 5/10.  She is ambulating well with no restrictions.  She denies any fever, cough, chest pain, shortness of breath, nausea, vomiting or diarrhea.  Objective:  Vital signs in last 24 hours:  Vitals:   05/30/22 1015 05/30/22 1209 05/30/22 1235 05/30/22 1307  BP: 118/82   110/73  Pulse: (!) 109   98  Resp: '16 16 13 '$ (!) 22  Temp:    98.4 F (36.9 C)  TempSrc:    Oral  SpO2: 97% 98% 97% 97%  Weight:      Height:        Intake/Output from previous day:   Intake/Output Summary (Last 24 hours) at 05/30/2022 1439 Last data filed at 05/30/2022 0800 Gross per 24 hour  Intake 686.61 ml  Output --  Net 686.61 ml    Physical Exam: General: Alert, awake, oriented x3, in no acute distress.  HEENT: [place]/AT PEERL, EOMI Neck: Trachea midline,  no masses, no thyromegal,y no JVD, no carotid bruit OROPHARYNX:  Moist, No exudate/ erythema/lesions.  Heart: Regular rate and rhythm, without murmurs, rubs, gallops, PMI non-displaced, no heaves or thrills on palpation.  Lungs: Clear to auscultation, no wheezing or rhonchi noted. No increased vocal fremitus resonant to percussion  Abdomen: Soft, nontender, nondistended, positive bowel sounds, no masses no hepatosplenomegaly noted..  Neuro: No focal neurological deficits noted cranial nerves II through XII grossly intact. DTRs 2+ bilaterally upper and lower extremities. Strength 5 out of 5 in bilateral upper and lower  extremities. Musculoskeletal: No warm swelling or erythema around joints, no spinal tenderness noted. Psychiatric: Patient alert and oriented x3, good insight and cognition, good recent to remote recall. Lymph node survey: No cervical axillary or inguinal lymphadenopathy noted.  Lab Results:  Basic Metabolic Panel:    Component Value Date/Time   NA 135 05/27/2022 0500   NA 138 01/14/2022 1136   K 3.9 05/27/2022 0500   CL 105 05/27/2022 0500   CO2 23 05/27/2022 0500   BUN 6 05/27/2022 0500   BUN 9 01/14/2022 1136   CREATININE 0.31 (L) 05/27/2022 0500   CREATININE 0.53 05/31/2017 1145   GLUCOSE 95 05/27/2022 0500   CALCIUM 8.6 (L) 05/27/2022 0500   CBC:    Component Value Date/Time   WBC 11.1 (H) 05/29/2022 0514   HGB 8.2 (L) 05/29/2022 0514   HGB 9.5 (L) 01/14/2022 1136   HCT 23.6 (L) 05/29/2022 0514   HCT 26.9 (L) 01/14/2022 1136   PLT 387 05/29/2022 0514   PLT 452 (H) 01/14/2022 1136   MCV 101.7 (H) 05/29/2022 0514   MCV 101 (H) 01/14/2022 1136   NEUTROABS 5.1 05/26/2022 1931   NEUTROABS 6.1 01/14/2022 1136   LYMPHSABS 2.3 05/26/2022 1931   LYMPHSABS 1.9 01/14/2022 1136   MONOABS 1.1 (H) 05/26/2022 1931   EOSABS 0.1 05/26/2022 1931   EOSABS 0.1 01/14/2022 1136   BASOSABS 0.0 05/26/2022 1931   BASOSABS 0.1 01/14/2022 1136    No results found for this  or any previous visit (from the past 240 hour(s)).  Studies/Results: IR REMOVAL TUN ACCESS W/ PORT W/O FL MOD SED  Result Date: 05/29/2022 INDICATION: 26 year old with sickle cell disease and right jugular Port-A-Cath was placed on 03/06/2022. Patient has developed deep venous thrombosis in the right internal jugular vein and the catheter is slightly retracted and coiled within the right internal jugular vein. Patient presents for Port-A-Cath removal. EXAM: REMOVAL RIGHT IJ VEIN PORT-A-CATH MEDICATIONS: Versed 1.5 mg ANESTHESIA/SEDATION: The patient's level of consciousness and vital signs were monitored continuously by  radiology nursing throughout the procedure under my direct supervision. FLUOROSCOPY: None COMPLICATIONS: None immediate. PROCEDURE: Informed written consent was obtained from the patient after a thorough discussion of the procedural risks, benefits and alternatives. All questions were addressed. Maximal Sterile Barrier Technique was utilized including caps, mask, sterile gowns, sterile gloves, sterile drape, hand hygiene and skin antiseptic. A timeout was performed prior to the initiation of the procedure. The right chest was prepped and draped in a sterile fashion. Lidocaine was utilized for local anesthesia. An incision was made over the previously healed surgical incision. Utilizing blunt dissection, the port catheter and reservoir were removed from the underlying subcutaneous tissue in their entirety. The pocket was irrigated with a copious amount of sterile normal saline. The subcutaneous tissue was closed with 3-0 Vicryl interrupted subcutaneous stitches. A 4-0 Vicryl running subcuticular stitch was utilized to approximate the skin. Dermabond was applied. IMPRESSION: Successful right IJ vein Port-A-Cath explant. Electronically Signed   By: Markus Daft M.D.   On: 05/29/2022 17:42    Medications: Scheduled Meds:  folic acid  1 mg Oral Daily   HYDROmorphone   Intravenous Q4H   hydroxyurea  1,500 mg Oral Daily   multivitamin with minerals  1 tablet Oral Daily   nicotine  14 mg Transdermal Daily   oxyCODONE  10 mg Oral Q12H   QUEtiapine  25 mg Oral QHS   rivaroxaban  20 mg Oral Q supper   senna-docusate  1 tablet Oral QHS   thiamine  100 mg Oral Daily   Or   thiamine  100 mg Intravenous Daily   Continuous Infusions: PRN Meds:.acetaminophen, albuterol, diphenhydrAMINE, lip balm, melatonin, naloxone **AND** sodium chloride flush, ondansetron, oxyCODONE, polyethylene glycol  Consultants: Interventional Radiology Pharmacy  Procedures: Port-A-Cath  Removal  Antibiotics: None  Assessment/Plan: Principal Problem:   Sickle cell pain crisis ([place]) Active Problems:   Bipolar and related disorder ([place])   Chronic anemia   Chronic pain   Tobacco abuse   Alcohol abuse  Hb Sickle Cell Disease with Pain crisis: Continue IVF at [facility], begin to wean weight based Dilaudid PCA, continue oral pain medications as ordered, Monitor vitals very closely, Re-evaluate pain scale regularly, 2 L of Oxygen by [place]. Right IJ thrombosis: Status post Port-A-Cath removal.  Patient is clinically stable.  She has been transitioned to her Xarelto 20 mg daily.  We will continue. Anemia of Chronic Disease: Hemoglobin is stable at baseline today.  There is no clinical indication for blood transfusion at this time.  We will continue to monitor closely and transfuse as appropriate. Chronic pain Syndrome: Continue oral home pain medications. Bipolar disorder: Stable.  Patient denies any suicidal ideations or thoughts.  We will continue home medications. Tobacco use disorder: Laqueena was counseled on the dangers of tobacco use, and was advised to quit. Reviewed strategies to maximize success, including removing cigarettes and smoking materials from environment, stress management and support of family/friends.   Code Status: Full  Code Family Communication: N/A Disposition Plan: Not yet ready for discharge  Julianny Milstein  If 7PM-7AM, please contact night-coverage.  05/30/2022, 2:39 PM  LOS: 4 days
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
## 2022-05-31 DIAGNOSIS — F1010 Alcohol abuse, uncomplicated: Secondary | ICD-10-CM | POA: Diagnosis not present

## 2022-05-31 DIAGNOSIS — F319 Bipolar disorder, unspecified: Secondary | ICD-10-CM | POA: Diagnosis not present

## 2022-05-31 DIAGNOSIS — D571 Sickle-cell disease without crisis: Secondary | ICD-10-CM

## 2022-05-31 DIAGNOSIS — D5700 Hb-SS disease with crisis, unspecified: Secondary | ICD-10-CM | POA: Diagnosis not present

## 2022-05-31 DIAGNOSIS — I8290 Acute embolism and thrombosis of unspecified vein: Secondary | ICD-10-CM | POA: Diagnosis not present

## 2022-05-31 LAB — CBC
HCT: 20.7 % — ABNORMAL LOW (ref 36.0–46.0)
Hemoglobin: 7.1 g/dL — ABNORMAL LOW (ref 12.0–15.0)
MCH: 35.1 pg — ABNORMAL HIGH (ref 26.0–34.0)
MCHC: 34.3 g/dL (ref 30.0–36.0)
MCV: 102.5 fL — ABNORMAL HIGH (ref 80.0–100.0)
Platelets: 380 10*3/uL (ref 150–400)
RBC: 2.02 MIL/uL — ABNORMAL LOW (ref 3.87–5.11)
RDW: 20.7 % — ABNORMAL HIGH (ref 11.5–15.5)
WBC: 7.6 10*3/uL (ref 4.0–10.5)
nRBC: 1 % — ABNORMAL HIGH (ref 0.0–0.2)

## 2022-05-31 MED ORDER — OXYCODONE HCL 15 MG PO TABS: 15.0000 mg | ORAL_TABLET | Freq: Four times a day (QID) | ORAL | 0 refills | Status: DC | PRN

## 2022-05-31 MED ORDER — RIVAROXABAN 20 MG PO TABS: 20.0000 mg | ORAL_TABLET | Freq: Every day | ORAL | 3 refills | Status: DC

## 2022-05-31 NOTE — Discharge Summary (Signed)
Physician Discharge Summary  Sandra Mckay BHA:[id number] DOB: 1995/10/14 DOA: 05/26/2022  PCP: Dorena Dew, FNP  Admit date: 05/26/2022  Discharge date: 05/31/2022  Discharge Diagnoses:  Principal Problem:   Sickle cell pain crisis ([place]) Active Problems:   Bipolar and related disorder ([place])   Chronic anemia   Chronic pain   Tobacco abuse   Alcohol abuse   Acute deep vein thrombosis (DVT) of non-extremity vein   Discharge Condition: Stable  Disposition:   Follow-up Information     Dorena Dew, FNP. Call in 1 week(s).   Specialty: Family Medicine Contact information: [address] [place] [address] [phone]                Pt is discharged home in good condition and is to follow up with Dorena Dew, FNP this week to have labs evaluated. Denis Koppel is instructed to increase activity slowly and balance with rest for the next few days, and use prescribed medication to complete treatment of pain  Diet: Regular Wt Readings from Last 3 Encounters:  05/26/22 59 kg  04/14/22 61 kg  03/27/22 61.4 kg    History of present illness:  Sandra Mckay is a 26 y.o. female with medical history significant for pulmonary embolism 3 months ago on Eliquis without compliance, bipolar disorder, tobacco use disorder, sickle cell disease, chronic pain syndrome, opiate dependence and tolerance, anemia of chronic disease, who presented to [facility] ED with complaints of her typical sickle cell pain crisis for the past 3 days which involved pain in her hips bilaterally knees bilaterally and bilateral lower extremities.  Also complaints of pain at the site of her port with notable edema for the past 3 days.  No erythema.  Admits to skipping her doses of Eliquis.  Sometimes forgetting to take her second dose.  No reported subjective fevers or chills.  Initially contacted the sickle cell clinic for the same complaints and was advised to go to the ER for  further evaluation.   In the ED, work-up revealed sickle cell pain crisis, CT chest and CT soft tissue neck with contrast consistent with right IJ thrombosis.  Started on heparin drip, IV fluid hydration, and IV opiate-based analgesics in the ED.  IR consulted to assist with the management of her right IJ thrombosis.  TRH, hospitalist service, was asked to admit.    ED Course: Tmax 98.8.  BP 107/69, pulse 81, respiratory rate 13, O2 saturation 100% on room air.  Lab studies remarkable for hemoglobin 8.3 from 7.37 days ago.  MCV 104.  WBC 8.7, platelet count 369.  Hospital Course:  Patient was admitted for sickle cell pain crisis and right IJ thrombosis and managed appropriately with IVF, IV Dilaudid via PCA and IV Toradol, as well as other adjunct therapies per sickle cell pain management protocols.  She was also initially placed on heparin per protocol and subsequently transitioned to oral DOAC, differently from what she was taking prior to admission.  As noted above patient was on Eliquis for pulmonary embolism that was diagnosed recently, although she was none adherence with her home Eliquis and she subsequently developed his IJ thrombosis, there is still need to change to Xarelto during this admission.  Her Port-A-Cath was removed as a result of this thrombosis.  Her hemoglobin remained stable at baseline throughout this admission, she did not require blood transfusion.  She was weaned gradually of IV Dilaudid PCA and successfully transitioned to her oral home pain medications.  She was counseled extensively about smoking cessation and to cut back on alcohol use.  Patient verbalized understanding.  She was also encouraged to be adherent with her home medications including DOAC.  As at today, patient is ambulating well with no significant pain and tolerating p.o. intake with no restrictions.  Patient was therefore discharged home today in a hemodynamically stable condition.   Sandra Mckay will follow-up with  PCP within 1 week of this discharge. Sandra Mckay was counseled extensively about nonpharmacologic means of pain management, patient verbalized understanding and was appreciative of  the care received during this admission.   We discussed the need for good hydration, monitoring of hydration status, avoidance of heat, cold, stress, and infection triggers. We discussed the need to be adherent with taking Hydrea and other home medications. Patient was reminded of the need to seek medical attention immediately if any symptom of bleeding, anemia, or infection occurs.  Discharge Exam: Vitals:   05/31/22 0011 05/31/22 0417  BP:  109/68  Pulse:  93  Resp: 18 16  Temp:  99.1 F (37.3 C)  SpO2:  97%   Vitals:   05/30/22 1634 05/30/22 2040 05/31/22 0011 05/31/22 0417  BP:  110/71  109/68  Pulse:  98  93  Resp: '18 18 18 16  '$ Temp:  99.1 F (37.3 C)  99.1 F (37.3 C)  TempSrc:  Oral  Oral  SpO2: 99% 100%  97%  Weight:      Height:        General appearance : Awake, alert, not in any distress. Speech Clear. Not toxic looking HEENT: Atraumatic and Normocephalic, pupils equally reactive to light and accomodation Neck: Supple, no JVD. No cervical lymphadenopathy.  Chest: Good air entry bilaterally, no added sounds  CVS: S1 S2 regular, no murmurs.  Abdomen: Bowel sounds present, Non tender and not distended with no gaurding, rigidity or rebound. Extremities: B/L Lower Ext shows no edema, both legs are warm to touch Neurology: Awake alert, and oriented X 3, CN II-XII intact, Non focal Skin: No Rash  Discharge Instructions  Discharge Instructions     Diet - low sodium heart healthy   Complete by: As directed    Increase activity slowly   Complete by: As directed       Allergies as of 05/31/2022       Reactions   Latex Rash   Wound Dressing Adhesive Rash        Medication List     STOP taking these medications    apixaban 5 MG Tabs tablet Commonly known as: ELIQUIS        TAKE these medications    albuterol 108 (90 Base) MCG/ACT inhaler Commonly known as: VENTOLIN HFA Inhale 3-4 puffs into the lungs 2 (two) times daily as needed for wheezing or shortness of breath.   folic acid 1 MG tablet Commonly known as: FOLVITE Take 1 tablet (1 mg total) by mouth daily.   gabapentin 100 MG capsule Commonly known as: NEURONTIN Take 3 capsules (300 mg total) by mouth 3 (three) times daily. What changed:  how much to take when to take this   hydroxyurea 500 MG capsule Commonly known as: HYDREA Take 3 capsules (1,500 mg total) by mouth daily. TAKE 3 CAPSULES (1,500 MG TOTAL) BY MOUTH DAILY. What changed: additional instructions   oxyCODONE 15 MG immediate release tablet Commonly known as: ROXICODONE Take 1 tablet (15 mg total) by mouth every 6 (six) hours as needed for up to 15 days for  pain. Start taking on: June 01, 2022 What changed: These instructions start on June 01, 2022. If you are unsure what to do until then, ask your doctor or other care provider.   QUEtiapine 25 MG tablet Commonly known as: SEROquel Take 1 tablet (25 mg total) by mouth at bedtime. What changed: when to take this   rivaroxaban 20 MG Tabs tablet Commonly known as: XARELTO Take 1 tablet (20 mg total) by mouth daily with supper.   tiZANidine 4 MG tablet Commonly known as: ZANAFLEX Take 1 tablet (4 mg total) by mouth 3 (three) times daily. What changed:  when to take this reasons to take this   Vitamin D (Ergocalciferol) 1.25 MG (50000 UNIT) Caps capsule Commonly known as: DRISDOL Take 1 capsule (50,000 Units total) by mouth every 7 (seven) days for 8 doses.        The results of significant diagnostics from this hospitalization (including imaging, microbiology, ancillary and laboratory) are listed below for reference.    Significant Diagnostic Studies: IR REMOVAL TUN ACCESS W/ PORT W/O FL MOD SED  Result Date: 05/29/2022 INDICATION: 26 year old with sickle  cell disease and right jugular Port-A-Cath was placed on 03/06/2022. Patient has developed deep venous thrombosis in the right internal jugular vein and the catheter is slightly retracted and coiled within the right internal jugular vein. Patient presents for Port-A-Cath removal. EXAM: REMOVAL RIGHT IJ VEIN PORT-A-CATH MEDICATIONS: Versed 1.5 mg ANESTHESIA/SEDATION: The patient's level of consciousness and vital signs were monitored continuously by radiology nursing throughout the procedure under my direct supervision. FLUOROSCOPY: None COMPLICATIONS: None immediate. PROCEDURE: Informed written consent was obtained from the patient after a thorough discussion of the procedural risks, benefits and alternatives. All questions were addressed. Maximal Sterile Barrier Technique was utilized including caps, mask, sterile gowns, sterile gloves, sterile drape, hand hygiene and skin antiseptic. A timeout was performed prior to the initiation of the procedure. The right chest was prepped and draped in a sterile fashion. Lidocaine was utilized for local anesthesia. An incision was made over the previously healed surgical incision. Utilizing blunt dissection, the port catheter and reservoir were removed from the underlying subcutaneous tissue in their entirety. The pocket was irrigated with a copious amount of sterile normal saline. The subcutaneous tissue was closed with 3-0 Vicryl interrupted subcutaneous stitches. A 4-0 Vicryl running subcuticular stitch was utilized to approximate the skin. Dermabond was applied. IMPRESSION: Successful right IJ vein Port-A-Cath explant. Electronically Signed   By: Markus Daft M.D.   On: 05/29/2022 17:42   CT Chest W Contrast  Result Date: 05/26/2022 CLINICAL DATA:  Chest wall pain, infection or inflammation suspected. Right-sided chest pain with difficulty swallowing, and neck pain. Concern for chest port complication. Sickle cell crisis. EXAM: CT CHEST WITH CONTRAST TECHNIQUE:  Multidetector CT imaging of the chest was performed during intravenous contrast administration. RADIATION DOSE REDUCTION: This exam was performed according to the departmental dose-optimization program which includes automated exposure control, adjustment of the mA and/or kV according to patient size and/or use of iterative reconstruction technique. CONTRAST:  68m OMNIPAQUE IOHEXOL 300 MG/ML  SOLN COMPARISON:  04/14/2022. FINDINGS: Cardiovascular: The heart is mildly enlarged and there is no pericardial effusion. The aorta and and pulmonary trunk are normal in caliber. A right chest wall port is noted with looping in the region of the insertion site in the internal jugular vein with retraction. There is suggestion of edema at the level of insertion in the internal jugular vein with mild mass effect upon the  thyroid gland and trachea. The chest port terminates in the superior vena cava. Mediastinum/Nodes: No mediastinal, hilar, or axillary lymphadenopathy. The thyroid gland, trachea, and esophagus are within normal limits. Lungs/Pleura: Strandy opacities are present at the lung bases. No consolidation, effusion, or pneumothorax. Upper Abdomen: The gallbladder is surgically absent. No acute abnormality. Musculoskeletal: No acute or suspicious osseous abnormality. IMPRESSION: 1. Right chest port with interval looping in the region of the insertion in the internal jugular vein with retraction. Mild edema is noted adjacent to the catheter loop with mild mass effect on the trachea and thyroid gland. Chest port terminates in the superior vena cava. Please see CT neck for additional information. 2. Mild cardiomegaly. 3. Strandy atelectasis or infiltrate at the lung bases. Electronically Signed   By: Brett Fairy M.D.   On: 05/26/2022 21:57   CT Soft Tissue Neck W Contrast  Result Date: 05/26/2022 CLINICAL DATA:  Soft tissue swelling, infection suspected. Right-sided neck pain. Difficulty swallowing. EXAM: CT NECK  WITH CONTRAST TECHNIQUE: Multidetector CT imaging of the neck was performed using the standard protocol following the bolus administration of intravenous contrast. RADIATION DOSE REDUCTION: This exam was performed according to the departmental dose-optimization program which includes automated exposure control, adjustment of the mA and/or kV according to patient size and/or use of iterative reconstruction technique. CONTRAST:  17m OMNIPAQUE IOHEXOL 300 MG/ML  SOLN COMPARISON:  None Available. FINDINGS: Pharynx and larynx: Mild prominence of lingual tonsils and palatine tonsils present bilaterally without focal mass lesion. Nasopharynx is clear. Vallecula and epiglottis are within normal limits. Aryepiglottic folds and piriform sinuses are clear. Vocal cords are midline and symmetric. Trachea is clear. Salivary glands: The submandibular and parotid glands and ducts are within normal limits. Thyroid: Normal Lymph nodes: Multiple subcentimeter nodes are present bilaterally. No hyperdense rounded nodes are present. Vascular: The right IJ Port-A-Cath is now twisted on itself within the right internal jugular vein. Thrombus is noted at the twist in the catheter extending inferiorly in the right internal jugular vein. The SVC is patent. Mild inflammatory changes are present within the right carotid canal and surrounding the thrombosed vein. Limited intracranial: Within normal limits. Visualized orbits: The globes and orbits are within normal limits. Mastoids and visualized paranasal sinuses: The paranasal sinuses and mastoid air cells are clear. Skeleton: 2 body heights alignment are normal. No focal osseous lesions are present. Upper chest: The lung apices are clear. Thoracic inlet is within normal limits. IMPRESSION: 1. The right IJ Port-A-Cath is now twisted on itself within the right internal jugular vein. 2. Thrombus is noted at the twist in the catheter extending inferiorly in the right internal jugular vein. The  SVC is patent. 3. Mild inflammatory changes within the right carotid canal and surrounding the thrombosed vein. 4. Mild prominence of lingual tonsils and palatine tonsils bilaterally without focal mass lesion. This is likely reactive. Critical Value/emergent results were called by telephone at the time of interpretation on 05/26/2022 at 9:54 pm [facility] who verbally acknowledged these results. Electronically Signed   By: CSan MorelleM.D.   On: 05/26/2022 21:55   DG Chest Port 1 View  Result Date: 05/26/2022 CLINICAL DATA:  Chest pain. Sickle cell pain in both legs and hips for 3 days. Right chest port with swelling of the right collar bone area. EXAM: PORTABLE CHEST 1 VIEW COMPARISON:  04/14/2022 FINDINGS: A power port type right central venous catheter is present. Since the prior study, the tip has retracted to the  level of the mid SVC and a loop is present at the expected vascular insertion site suggesting that the catheter has backed out since prior study. Heart size and pulmonary vascularity are normal. Lungs are clear. No pleural effusions. No pneumothorax. IMPRESSION: 1. Indwelling right central venous catheter appears to have retracted since the prior study with a loop suggested at the expected vascular insertion site. 2. No evidence of active pulmonary disease. Electronically Signed   By: Lucienne Capers M.D.   On: 05/26/2022 19:08    Microbiology: No results found for this or any previous visit (from the past 240 hour(s)).   Labs: Basic Metabolic Panel: Recent Labs  Lab 05/26/22 1931 05/27/22 0500  NA 135 135  K 3.8 3.9  CL 105 105  CO2 24 23  GLUCOSE 99 95  BUN 6 6  CREATININE 0.43* 0.31*  CALCIUM 8.9 8.6*  MG  --  2.0  PHOS  --  4.2   Liver Function Tests: Recent Labs  Lab 05/27/22 0500  AST 211*  ALT 106*  ALKPHOS 143*  BILITOT 4.2*  PROT 6.4*  ALBUMIN 3.8   No results for input(s): "LIPASE", "AMYLASE" in the last 168 hours. No results  for input(s): "AMMONIA" in the last 168 hours. CBC: Recent Labs  Lab 05/26/22 1931 05/27/22 1700 05/28/22 0436 05/29/22 0514 05/31/22 0639  WBC 8.7 7.3 9.0 11.1* 7.6  NEUTROABS 5.1  --   --   --   --   HGB 8.3* 7.5* 7.3* 8.2* 7.1*  HCT 23.8* 22.0* 20.7* 23.6* 20.7*  MCV 104.4* 104.8* 102.0* 101.7* 102.5*  PLT 369 363 331 387 380   Cardiac Enzymes: No results for input(s): "CKTOTAL", "CKMB", "CKMBINDEX", "TROPONINI" in the last 168 hours. BNP: Invalid input(s): "POCBNP" CBG: No results for input(s): "GLUCAP" in the last 168 hours.  Time coordinating discharge: 50 minutes  Signed:  [facility] 05/31/2022, 11:31 AM

## 2022-05-31 NOTE — TOC Transition Note (Signed)
Transition of Care [facility]) - CM/SW Discharge Note   Patient Details  Name: Sandra Mckay MRN: [id number] Date of Birth: 02-28-1996  Transition of Care [facility]) CM/SW Contact:  Ross Ludwig, LCSW Phone Number: 05/31/2022, 11:50 AM   Clinical Narrative:     Patient does not have any needs per [name] note from yesterday, this [name] signing off, please reconsult if other social work needs arise.  Final next level of care: Home/Self Care Barriers to Discharge: Continued Medical Work up   Patient Goals and CMS Choice Patient states their goals for this hospitalization and ongoing recovery are:: to quit hurting CMS Medicare.gov Compare Post Acute Care list provided to:: Patient    Discharge Placement                       Discharge Plan and Services   Discharge Planning Services: CM Consult                                 Social Determinants of Health (SDOH) Interventions     Readmission Risk Interventions    05/31/2022   11:50 AM  Readmission Risk Prevention Plan  Transportation Screening Complete  Medication Review (RN Care Manager) Referral to Pharmacy  PCP or Specialist appointment within 3-5 days of discharge Complete  HRI or [place] Not Complete  HRI or Home Care Consult Pt Refusal Comments Not applicable  SW Recovery Care/Counseling Consult Complete  Palliative Care Screening Not [place] Not Applicable

## 2022-06-02 ENCOUNTER — Ambulatory Visit (INDEPENDENT_AMBULATORY_CARE_PROVIDER_SITE_OTHER): Payer: Medicaid Other | Admitting: Clinical

## 2022-06-02 ENCOUNTER — Ambulatory Visit (INDEPENDENT_AMBULATORY_CARE_PROVIDER_SITE_OTHER): Payer: Medicaid Other | Admitting: Family Medicine

## 2022-06-02 VITALS — BP 98/60 | HR 82 | Temp 98.2°F | Wt 128.8 lb

## 2022-06-02 DIAGNOSIS — G894 Chronic pain syndrome: Secondary | ICD-10-CM | POA: Diagnosis not present

## 2022-06-02 DIAGNOSIS — F419 Anxiety disorder, unspecified: Secondary | ICD-10-CM

## 2022-06-02 DIAGNOSIS — D571 Sickle-cell disease without crisis: Secondary | ICD-10-CM | POA: Diagnosis not present

## 2022-06-02 DIAGNOSIS — F319 Bipolar disorder, unspecified: Secondary | ICD-10-CM

## 2022-06-02 MED ORDER — OXYCODONE HCL 15 MG PO TABS: 15.0000 mg | ORAL_TABLET | Freq: Four times a day (QID) | ORAL | 0 refills | Status: DC | PRN

## 2022-06-02 NOTE — Patient Instructions (Signed)
Crizanlizumab Injection What is this medication? CRIZANLIZUMAB (KRIZ an LIZ ue mab) prevents the symptoms of sickle cell disease, such as pain crises and acute chest syndrome. It works by preventing blood cells from clumping together. This increases blood flow and the amount of oxygen that gets to your tissues. It is a monoclonal antibody. This medicine may be used for other purposes; ask your health care provider or pharmacist if you have questions. COMMON BRAND NAME(S): ADAKVEO What should I tell my care team before I take this medication? They need to know if you have any of these conditions: An unusual or allergic reaction to crizanlizumab, other medications, foods, dyes or preservatives Pregnant or trying to get pregnant Breast-feeding How should I use this medication? This medication is injected into a vein. It is given by your care team in a hospital or clinic setting. Talk to your care team about the use of this medication in children. While it may be prescribed for children as young as 16 years for selected conditions, precautions do apply. Overdosage: If you think you have taken too much of this medicine contact a poison control center or emergency room at once. NOTE: This medicine is only for you. Do not share this medicine with others. What if I miss a dose? Keep appointments for follow-up doses. It is important not to miss your dose. Call your care team if you are unable to keep an appointment. What may interact with this medication? Interactions are not expected. This list may not describe all possible interactions. Give your health care provider a list of all the medicines, herbs, non-prescription drugs, or dietary supplements you use. Also tell them if you smoke, drink alcohol, or use illegal drugs. Some items may interact with your medicine. What should I watch for while using this medication? Your condition will be monitored carefully while you are receiving this  medication. What side effects may I notice from receiving this medication? Side effects that you should report to your care team as soon as possible: Allergic reactions--skin rash, itching, hives, swelling of the face, lips, tongue, or throat Infusion reactions--chest pain, shortness of breath or trouble breathing, feeling faint or lightheaded Side effects that usually do not require medical attention (report these to your care team if they continue or are bothersome): Back pain Fever Joint pain Nausea Stomach pain This list may not describe all possible side effects. Call your doctor for medical advice about side effects. You may report side effects to FDA at 1-800-FDA-1088. Where should I keep my medication? This medication is given in a hospital or clinic. It will not be stored at home. NOTE: This sheet is a summary. It may not cover all possible information. If you have questions about this medicine, talk to your doctor, pharmacist, or health care provider.  2023 Elsevier/Gold Standard (2021-09-08 00:00:00)
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
## 2022-06-03 ENCOUNTER — Other Ambulatory Visit: Payer: Self-pay | Admitting: Nurse Practitioner

## 2022-06-03 DIAGNOSIS — F319 Bipolar disorder, unspecified: Secondary | ICD-10-CM

## 2022-06-04 LAB — CBC WITH DIFFERENTIAL/PLATELET
Basophils Absolute: 0.1 10*3/uL (ref 0.0–0.2)
Basos: 1 %
EOS (ABSOLUTE): 0 10*3/uL (ref 0.0–0.4)
Eos: 0 %
Hematocrit: 23.3 % — ABNORMAL LOW (ref 34.0–46.6)
Hemoglobin: 7.9 g/dL — ABNORMAL LOW (ref 11.1–15.9)
Immature Grans (Abs): 0 10*3/uL (ref 0.0–0.1)
Immature Granulocytes: 0 %
Lymphocytes Absolute: 2.3 10*3/uL (ref 0.7–3.1)
Lymphs: 33 %
MCH: 35.9 pg — ABNORMAL HIGH (ref 26.6–33.0)
MCHC: 33.9 g/dL (ref 31.5–35.7)
MCV: 106 fL — ABNORMAL HIGH (ref 79–97)
Monocytes Absolute: 1.3 10*3/uL — ABNORMAL HIGH (ref 0.1–0.9)
Monocytes: 18 %
NRBC: 2 % — ABNORMAL HIGH (ref 0–0)
Neutrophils Absolute: 3.3 10*3/uL (ref 1.4–7.0)
Neutrophils: 48 %
Platelets: 470 10*3/uL — ABNORMAL HIGH (ref 150–450)
RBC: 2.2 x10E6/uL — CL (ref 3.77–5.28)
RDW: 24.4 % — ABNORMAL HIGH (ref 11.7–15.4)
WBC: 6.9 10*3/uL (ref 3.4–10.8)

## 2022-06-04 LAB — CMP AND LIVER
ALT: 56 IU/L — ABNORMAL HIGH (ref 0–32)
AST: 56 IU/L — ABNORMAL HIGH (ref 0–40)
Albumin: 4.8 g/dL (ref 4.0–5.0)
Alkaline Phosphatase: 219 IU/L — ABNORMAL HIGH (ref 44–121)
BUN: 7 mg/dL (ref 6–20)
Bilirubin Total: 2.1 mg/dL — ABNORMAL HIGH (ref 0.0–1.2)
Bilirubin, Direct: 0.7 mg/dL — ABNORMAL HIGH (ref 0.00–0.40)
CO2: 23 mmol/L (ref 20–29)
Calcium: 9.4 mg/dL (ref 8.7–10.2)
Chloride: 103 mmol/L (ref 96–106)
Creatinine, Ser: 0.52 mg/dL — ABNORMAL LOW (ref 0.57–1.00)
Glucose: 93 mg/dL (ref 70–99)
Potassium: 3.9 mmol/L (ref 3.5–5.2)
Sodium: 139 mmol/L (ref 134–144)
Total Protein: 7.2 g/dL (ref 6.0–8.5)
eGFR: 130 mL/min/{1.73_m2} (ref >59)

## 2022-06-04 LAB — RETICULOCYTES: Retic Ct Pct: 15.9 % — ABNORMAL HIGH (ref 0.6–2.6)

## 2022-06-07 LAB — OXYCODONE/OXYMORPHONE, CONFIRM
OXYCODONE/OXYMORPH: POSITIVE — AB
OXYCODONE: 1373 ng/mL
OXYCODONE: POSITIVE — AB
OXYMORPHONE (GC/MS): 1607 ng/mL
OXYMORPHONE: POSITIVE — AB

## 2022-06-07 LAB — DRUG SCREEN 764883 11+OXYCO+ALC+CRT-BUND
Amphetamines, Urine: NEGATIVE ng/mL
BENZODIAZ UR QL: NEGATIVE ng/mL
Barbiturate: NEGATIVE ng/mL
Cocaine (Metabolite): NEGATIVE ng/mL
Creatinine: 167.1 mg/dL (ref 20.0–300.0)
Ethanol: NEGATIVE %
Meperidine: NEGATIVE ng/mL
Methadone Screen, Urine: NEGATIVE ng/mL
Phencyclidine: NEGATIVE ng/mL
Propoxyphene: NEGATIVE ng/mL
Tramadol: NEGATIVE ng/mL
pH, Urine: 5.9 (ref 4.5–8.9)

## 2022-06-07 LAB — CANNABINOID CONFIRMATION, UR
CANNABINOIDS: POSITIVE — AB
Carboxy THC GC/MS Conf: 525 ng/mL

## 2022-06-07 LAB — OPIATES CONFIRMATION, URINE
Codeine: NEGATIVE
Hydrocodone Confirm: 854 ng/mL
Hydrocodone: POSITIVE — AB
Hydromorphone Confirm: 1262 ng/mL
Hydromorphone: POSITIVE — AB
Morphine: NEGATIVE
Opiates: POSITIVE ng/mL — AB

## 2022-06-08 ENCOUNTER — Telehealth: Payer: Self-pay | Admitting: Clinical

## 2022-06-08 NOTE — Telephone Encounter (Signed)
[facility] sickle cell support group via virtual format.   Ayesha Markwell, LCSW [facility] [phone] 

## 2022-06-11 ENCOUNTER — Non-Acute Institutional Stay (HOSPITAL_COMMUNITY)
Admission: AD | Admit: 2022-06-11 | Discharge: 2022-06-11 | Disposition: A | Discharge status: Home / Self Care | Payer: Medicaid Other | Source: Ambulatory Visit | Attending: Internal Medicine | Admitting: Internal Medicine

## 2022-06-11 ENCOUNTER — Telehealth (HOSPITAL_COMMUNITY): Payer: Self-pay | Admitting: *Deleted

## 2022-06-11 ENCOUNTER — Other Ambulatory Visit: Payer: Self-pay | Admitting: Family Medicine

## 2022-06-11 DIAGNOSIS — D5700 Hb-SS disease with crisis, unspecified: Secondary | ICD-10-CM | POA: Diagnosis present

## 2022-06-11 DIAGNOSIS — Z7901 Long term (current) use of anticoagulants: Secondary | ICD-10-CM | POA: Diagnosis not present

## 2022-06-11 DIAGNOSIS — F319 Bipolar disorder, unspecified: Secondary | ICD-10-CM | POA: Diagnosis not present

## 2022-06-11 DIAGNOSIS — F1120 Opioid dependence, uncomplicated: Secondary | ICD-10-CM | POA: Insufficient documentation

## 2022-06-11 DIAGNOSIS — G894 Chronic pain syndrome: Secondary | ICD-10-CM | POA: Diagnosis not present

## 2022-06-11 DIAGNOSIS — D571 Sickle-cell disease without crisis: Secondary | ICD-10-CM

## 2022-06-11 DIAGNOSIS — Z79899 Other long term (current) drug therapy: Secondary | ICD-10-CM | POA: Insufficient documentation

## 2022-06-11 LAB — CBC WITH DIFFERENTIAL/PLATELET
Abs Immature Granulocytes: 0.01 10*3/uL (ref 0.00–0.07)
Basophils Absolute: 0 10*3/uL (ref 0.0–0.1)
Basophils Relative: 1 %
Eosinophils Absolute: 0 10*3/uL (ref 0.0–0.5)
Eosinophils Relative: 0 %
HCT: 25.5 % — ABNORMAL LOW (ref 36.0–46.0)
Hemoglobin: 8.9 g/dL — ABNORMAL LOW (ref 12.0–15.0)
Immature Granulocytes: 0 %
Lymphocytes Relative: 37 %
Lymphs Abs: 2.4 10*3/uL (ref 0.7–4.0)
MCH: 37.9 pg — ABNORMAL HIGH (ref 26.0–34.0)
MCHC: 34.9 g/dL (ref 30.0–36.0)
MCV: 108.5 fL — ABNORMAL HIGH (ref 80.0–100.0)
Monocytes Absolute: 0.7 10*3/uL (ref 0.1–1.0)
Monocytes Relative: 11 %
Neutro Abs: 3.3 10*3/uL (ref 1.7–7.7)
Neutrophils Relative %: 51 %
Platelets: 507 10*3/uL — ABNORMAL HIGH (ref 150–400)
RBC: 2.35 MIL/uL — ABNORMAL LOW (ref 3.87–5.11)
RDW: 21.9 % — ABNORMAL HIGH (ref 11.5–15.5)
WBC: 6.4 10*3/uL (ref 4.0–10.5)
nRBC: 1.9 % — ABNORMAL HIGH (ref 0.0–0.2)

## 2022-06-11 LAB — RAPID URINE DRUG SCREEN, HOSP PERFORMED
Amphetamines: NOT DETECTED
Barbiturates: NOT DETECTED
Benzodiazepines: NOT DETECTED
Cocaine: NOT DETECTED
Opiates: NOT DETECTED
Tetrahydrocannabinol: POSITIVE — AB

## 2022-06-11 LAB — COMPREHENSIVE METABOLIC PANEL
ALT: 36 U/L (ref 0–44)
AST: 37 U/L (ref 15–41)
Albumin: 4.9 g/dL (ref 3.5–5.0)
Alkaline Phosphatase: 125 U/L (ref 38–126)
Anion gap: 4 — ABNORMAL LOW (ref 5–15)
BUN: 8 mg/dL (ref 6–20)
CO2: 25 mmol/L (ref 22–32)
Calcium: 9.3 mg/dL (ref 8.9–10.3)
Chloride: 108 mmol/L (ref 98–111)
Creatinine, Ser: 0.49 mg/dL (ref 0.44–1.00)
GFR, Estimated: >60 mL/min (ref >60)
Glucose, Bld: 89 mg/dL (ref 70–99)
Potassium: 3.8 mmol/L (ref 3.5–5.1)
Sodium: 137 mmol/L (ref 135–145)
Total Bilirubin: 1.6 mg/dL — ABNORMAL HIGH (ref 0.3–1.2)
Total Protein: 8.4 g/dL — ABNORMAL HIGH (ref 6.5–8.1)

## 2022-06-11 LAB — RETICULOCYTES
Immature Retic Fract: 23.4 % — ABNORMAL HIGH (ref 2.3–15.9)
RBC.: 2.32 MIL/uL — ABNORMAL LOW (ref 3.87–5.11)
Retic Count, Absolute: 372 10*3/uL — ABNORMAL HIGH (ref 19.0–186.0)
Retic Ct Pct: 15.8 % — ABNORMAL HIGH (ref 0.4–3.1)

## 2022-06-11 LAB — PREGNANCY, URINE: Preg Test, Ur: NEGATIVE

## 2022-06-11 MED ORDER — NALOXONE HCL 0.4 MG/ML IJ SOLN: 0.4000 mg | INTRAMUSCULAR | Status: DC | PRN

## 2022-06-11 MED ORDER — ACETAMINOPHEN 500 MG PO TABS
1000.0000 mg | ORAL_TABLET | Freq: Once | ORAL | Status: AC
  Administered 2022-06-11: 1000 mg via ORAL
  Filled 2022-06-11: qty 2

## 2022-06-11 MED ORDER — ONDANSETRON HCL 4 MG/2ML IJ SOLN
4.0000 mg | Freq: Four times a day (QID) | INTRAMUSCULAR | Status: DC | PRN
  Administered 2022-06-11: 4 mg via INTRAVENOUS
  Filled 2022-06-11: qty 2

## 2022-06-11 MED ORDER — SODIUM CHLORIDE 0.9% FLUSH: 9.0000 mL | INTRAVENOUS | Status: DC | PRN

## 2022-06-11 MED ORDER — KETOROLAC TROMETHAMINE 30 MG/ML IJ SOLN
15.0000 mg | Freq: Once | INTRAMUSCULAR | Status: AC
  Administered 2022-06-11: 15 mg via INTRAVENOUS
  Filled 2022-06-11: qty 1

## 2022-06-11 MED ORDER — HYDROMORPHONE 1 MG/ML IV SOLN
INTRAVENOUS | Status: DC
  Administered 2022-06-11: 9 mg via INTRAVENOUS
  Filled 2022-06-11: qty 30

## 2022-06-11 MED ORDER — DIPHENHYDRAMINE HCL 25 MG PO CAPS: 25.0000 mg | ORAL_CAPSULE | ORAL | Status: DC | PRN

## 2022-06-11 MED ORDER — XTAMPZA ER 13.5 MG PO C12A: 13.5000 mg | EXTENDED_RELEASE_CAPSULE | Freq: Two times a day (BID) | ORAL | 0 refills | Status: DC

## 2022-06-11 MED ORDER — SODIUM CHLORIDE 0.45 % IV SOLN: INTRAVENOUS | Status: DC

## 2022-06-11 NOTE — Telephone Encounter (Signed)
Patient called requesting to come to the day infusion hospital for sickle cell pain. Patient reports pain in bilateral hips and legs rated 8/10. Reports taking Xtampza yesterday. Patient is now out of her Xtampza and Oxycodone. Patient reports that her Sandra Mckay is due today and her Oxycodone is due next week. COVID-19 screening done and patient denies all symptoms and exposures. Admits to some abdominal pain due to constipation put denies fever, chest pain, nausea, vomiting and diarrhea. Admits to having transportation without driving self. [place], [place] notified. Patient can come to the day hospital for pain management. Patient advised and expresses an understanding.

## 2022-06-11 NOTE — Telephone Encounter (Signed)
Xtampza ° °
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
## 2022-06-11 NOTE — Discharge Summary (Signed)
[facility] Discharge Summary   Patient ID: Sandra Mckay MRN: [id number] DOB/AGE: 28/09/1995 28 y.o.  Admit date: 06/11/2022 Discharge date: 06/11/2022  Primary Care Physician:  Dorena Dew, FNP  Admission Diagnoses:  Active Problems:   Sickle cell pain crisis [facility])   Discharge Medications:  Allergies as of 06/11/2022       Reactions   Latex Rash   Wound Dressing Adhesive Rash        Medication List     TAKE these medications    albuterol 108 (90 Base) MCG/ACT inhaler Commonly known as: VENTOLIN HFA Inhale 3-4 puffs into the lungs 2 (two) times daily as needed for wheezing or shortness of breath.   folic acid 1 MG tablet Commonly known as: FOLVITE Take 1 tablet (1 mg total) by mouth daily.   gabapentin 100 MG capsule Commonly known as: NEURONTIN Take 3 capsules (300 mg total) by mouth 3 (three) times daily. What changed:  how much to take when to take this   hydroxyurea 500 MG capsule Commonly known as: HYDREA Take 3 capsules (1,500 mg total) by mouth daily. TAKE 3 CAPSULES (1,500 MG TOTAL) BY MOUTH DAILY. What changed: additional instructions   oxyCODONE 15 MG immediate release tablet Commonly known as: ROXICODONE Take 1 tablet (15 mg total) by mouth every 6 (six) hours as needed for up to 15 days for pain.   QUEtiapine 25 MG tablet Commonly known as: SEROQUEL TAKE 1 TABLET BY MOUTH EVERYDAY AT BEDTIME   rivaroxaban 20 MG Tabs tablet Commonly known as: XARELTO Take 1 tablet (20 mg total) by mouth daily with supper.   tiZANidine 4 MG tablet Commonly known as: ZANAFLEX Take 1 tablet (4 mg total) by mouth 3 (three) times daily. What changed:  when to take this reasons to take this         Consults:  None  Significant Diagnostic Studies:  IR REMOVAL TUN ACCESS W/ PORT W/O FL MOD SED  Result Date: 05/29/2022 INDICATION: 26 year old with sickle cell disease and right jugular Port-A-Cath was placed on 03/06/2022. Patient  has developed deep venous thrombosis in the right internal jugular vein and the catheter is slightly retracted and coiled within the right internal jugular vein. Patient presents for Port-A-Cath removal. EXAM: REMOVAL RIGHT IJ VEIN PORT-A-CATH MEDICATIONS: Versed 1.5 mg ANESTHESIA/SEDATION: The patient's level of consciousness and vital signs were monitored continuously by radiology nursing throughout the procedure under my direct supervision. FLUOROSCOPY: None COMPLICATIONS: None immediate. PROCEDURE: Informed written consent was obtained from the patient after a thorough discussion of the procedural risks, benefits and alternatives. All questions were addressed. Maximal Sterile Barrier Technique was utilized including caps, mask, sterile gowns, sterile gloves, sterile drape, hand hygiene and skin antiseptic. A timeout was performed prior to the initiation of the procedure. The right chest was prepped and draped in a sterile fashion. Lidocaine was utilized for local anesthesia. An incision was made over the previously healed surgical incision. Utilizing blunt dissection, the port catheter and reservoir were removed from the underlying subcutaneous tissue in their entirety. The pocket was irrigated with a copious amount of sterile normal saline. The subcutaneous tissue was closed with 3-0 Vicryl interrupted subcutaneous stitches. A 4-0 Vicryl running subcuticular stitch was utilized to approximate the skin. Dermabond was applied. IMPRESSION: Successful right IJ vein Port-A-Cath explant. Electronically Signed   By: Markus Daft M.D.   On: 05/29/2022 17:42   CT Chest W Contrast  Result Date: 05/26/2022 CLINICAL DATA:  Chest wall pain,  infection or inflammation suspected. Right-sided chest pain with difficulty swallowing, and neck pain. Concern for chest port complication. Sickle cell crisis. EXAM: CT CHEST WITH CONTRAST TECHNIQUE: Multidetector CT imaging of the chest was performed during intravenous contrast  administration. RADIATION DOSE REDUCTION: This exam was performed according to the departmental dose-optimization program which includes automated exposure control, adjustment of the mA and/or kV according to patient size and/or use of iterative reconstruction technique. CONTRAST:  42m OMNIPAQUE IOHEXOL 300 MG/ML  SOLN COMPARISON:  04/14/2022. FINDINGS: Cardiovascular: The heart is mildly enlarged and there is no pericardial effusion. The aorta and and pulmonary trunk are normal in caliber. A right chest wall port is noted with looping in the region of the insertion site in the internal jugular vein with retraction. There is suggestion of edema at the level of insertion in the internal jugular vein with mild mass effect upon the thyroid gland and trachea. The chest port terminates in the superior vena cava. Mediastinum/Nodes: No mediastinal, hilar, or axillary lymphadenopathy. The thyroid gland, trachea, and esophagus are within normal limits. Lungs/Pleura: Strandy opacities are present at the lung bases. No consolidation, effusion, or pneumothorax. Upper Abdomen: The gallbladder is surgically absent. No acute abnormality. Musculoskeletal: No acute or suspicious osseous abnormality. IMPRESSION: 1. Right chest port with interval looping in the region of the insertion in the internal jugular vein with retraction. Mild edema is noted adjacent to the catheter loop with mild mass effect on the trachea and thyroid gland. Chest port terminates in the superior vena cava. Please see CT neck for additional information. 2. Mild cardiomegaly. 3. Strandy atelectasis or infiltrate at the lung bases. Electronically Signed   By: LBrett FairyM.D.   On: 05/26/2022 21:57   CT Soft Tissue Neck W Contrast  Result Date: 05/26/2022 CLINICAL DATA:  Soft tissue swelling, infection suspected. Right-sided neck pain. Difficulty swallowing. EXAM: CT NECK WITH CONTRAST TECHNIQUE: Multidetector CT imaging of the neck was performed using  the standard protocol following the bolus administration of intravenous contrast. RADIATION DOSE REDUCTION: This exam was performed according to the departmental dose-optimization program which includes automated exposure control, adjustment of the mA and/or kV according to patient size and/or use of iterative reconstruction technique. CONTRAST:  829mOMNIPAQUE IOHEXOL 300 MG/ML  SOLN COMPARISON:  None Available. FINDINGS: Pharynx and larynx: Mild prominence of lingual tonsils and palatine tonsils present bilaterally without focal mass lesion. Nasopharynx is clear. Vallecula and epiglottis are within normal limits. Aryepiglottic folds and piriform sinuses are clear. Vocal cords are midline and symmetric. Trachea is clear. Salivary glands: The submandibular and parotid glands and ducts are within normal limits. Thyroid: Normal Lymph nodes: Multiple subcentimeter nodes are present bilaterally. No hyperdense rounded nodes are present. Vascular: The right IJ Port-A-Cath is now twisted on itself within the right internal jugular vein. Thrombus is noted at the twist in the catheter extending inferiorly in the right internal jugular vein. The SVC is patent. Mild inflammatory changes are present within the right carotid canal and surrounding the thrombosed vein. Limited intracranial: Within normal limits. Visualized orbits: The globes and orbits are within normal limits. Mastoids and visualized paranasal sinuses: The paranasal sinuses and mastoid air cells are clear. Skeleton: 2 body heights alignment are normal. No focal osseous lesions are present. Upper chest: The lung apices are clear. Thoracic inlet is within normal limits. IMPRESSION: 1. The right IJ Port-A-Cath is now twisted on itself within the right internal jugular vein. 2. Thrombus is noted at the twist in the catheter  extending inferiorly in the right internal jugular vein. The SVC is patent. 3. Mild inflammatory changes within the right carotid canal and  surrounding the thrombosed vein. 4. Mild prominence of lingual tonsils and palatine tonsils bilaterally without focal mass lesion. This is likely reactive. Critical Value/emergent results were called by telephone at the time of interpretation on 05/26/2022 at 9:54 pm to provider [facility] , who verbally acknowledged these results. Electronically Signed   By: San Morelle M.D.   On: 05/26/2022 21:55   DG Chest Port 1 View  Result Date: 05/26/2022 CLINICAL DATA:  Chest pain. Sickle cell pain in both legs and hips for 3 days. Right chest port with swelling of the right collar bone area. EXAM: PORTABLE CHEST 1 VIEW COMPARISON:  04/14/2022 FINDINGS: A power port type right central venous catheter is present. Since the prior study, the tip has retracted to the level of the mid SVC and a loop is present at the expected vascular insertion site suggesting that the catheter has backed out since prior study. Heart size and pulmonary vascularity are normal. Lungs are clear. No pleural effusions. No pneumothorax. IMPRESSION: 1. Indwelling right central venous catheter appears to have retracted since the prior study with a loop suggested at the expected vascular insertion site. 2. No evidence of active pulmonary disease. Electronically Signed   By: Lucienne Capers M.D.   On: 05/26/2022 19:08     History of Present Illness: Dereon Williamsen is a 26 year old female with a medical history significant for sickle cell disease, chronic pain syndrome, opiate dependence and tolerance, history of cocaine abuse, anemia of chronic disease, and history of bipolar depression presents with complaints of generalized pain that is consistent with her typical crisis.  Pain intensity has been elevated over the past several days and uncontrolled by her home medications.  Patient last had oxycodone this a.m. without sustained relief.  Pain intensity is 9/10, constant, and aching.  She denies any fever, chills, chest pain, or  shortness of breath.  No urinary symptoms, nausea, vomiting, or constipation.  Patient does endorse some diarrhea over the past 24 hours.  She has not had any sick contacts.  No recent travel or known exposure to COVID-19.  [facility] Course: Patient admitted to sickle cell day infusion clinic for management of pain crisis. Reviewed all laboratory values, largely consistent with her baseline. Pain managed with IV Dilaudid PCA Toradol 15 mg x 1 Tylenol 1000 mg x 1 IV fluids, 0.45% saline at 100 mL/h. Patient's pain intensity decreased to 4/10 and she is requesting discharge home.  She is alert, oriented, and ambulating without assistance. Patient will discharge home in a hemodynamically stable condition.  Discharge instructions: Resume all home medications.   Follow up with PCP as previously  scheduled.   Discussed the importance of drinking 64 ounces of water daily, dehydration of red blood cells may lead further sickling.   Avoid all stressors that precipitate sickle cell pain crisis.     The patient was given clear instructions to go to ER or return to medical center if symptoms do not improve, worsen or new problems develop.   Physical Exam at Discharge:  BP 102/65 (BP Location: Right Arm)   Pulse 64   Temp 99.8 F (37.7 C) (Temporal)   Resp 16   LMP 05/19/2022   SpO2 100%  Physical Exam Constitutional:      Appearance: Normal appearance.  Eyes:     Pupils: Pupils are equal,  round, and reactive to light.  Cardiovascular:     Rate and Rhythm: Normal rate and regular rhythm.     Pulses: Normal pulses.  Abdominal:     General: Bowel sounds are normal.  Musculoskeletal:        General: Normal range of motion.  Skin:    General: Skin is warm.  Neurological:     General: No focal deficit present.     Mental Status: She is alert and oriented to person, place, and time. Mental status is at baseline.  Psychiatric:        Mood and Affect: Mood normal.         Behavior: Behavior normal.        Thought Content: Thought content normal.        Judgment: Judgment normal.      Disposition at Discharge: Discharge disposition: 01-Home or Self Care       Discharge Orders: Discharge Instructions     Discharge patient   Complete by: As directed    Discharge disposition: 01-Home or Self Care   Discharge patient date: 06/11/2022       Condition at Discharge:   Stable  Time spent on Discharge:  Greater than 30 minutes.  Signed: [name]  APRN, MSN, FNP-C [facility] [address] [place], [place] [address] [phone]  06/11/2022, 3:26 PM

## 2022-06-11 NOTE — Progress Notes (Signed)
Reviewed PDMP substance reporting system prior to prescribing opiate medications. No inconsistencies noted.  Meds ordered this encounter  Medications   oxyCODONE ER (XTAMPZA ER) 13.5 MG C12A    Sig: Take 13.5 mg by mouth every 12 (twelve) hours.    Dispense:  30 capsule    Refill:  0    Order Specific Question:   Supervising Provider    Answer:   Tresa Garter [[id number]]   [name]  APRN, MSN, FNP-C [facility] [place] [address] [place], [place] [address] [phone]

## 2022-06-11 NOTE — Progress Notes (Signed)
Patient admitted to the day infusion hospital for sickle cell pain crisis. Initially, patient reported bilateral leg and hip pain rated 8/10. For pain management, patient placed on Dilaudid PCA, given 15 mg Toradol, 1000 mg Tylenol and hydrated with IV fluids. At discharge, patient rated pain at 6/10. Vital signs stable. AVS offered but patient refused. Patient alert, oriented and ambulatory at discharge.

## 2022-06-15 ENCOUNTER — Ambulatory Visit (INDEPENDENT_AMBULATORY_CARE_PROVIDER_SITE_OTHER): Payer: Medicaid Other | Admitting: Clinical

## 2022-06-15 DIAGNOSIS — F419 Anxiety disorder, unspecified: Secondary | ICD-10-CM

## 2022-06-15 DIAGNOSIS — F319 Bipolar disorder, unspecified: Secondary | ICD-10-CM | POA: Diagnosis not present
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
## 2022-06-16 ENCOUNTER — Telehealth: Payer: Self-pay | Admitting: Clinical

## 2022-06-16 ENCOUNTER — Other Ambulatory Visit: Payer: Self-pay

## 2022-06-16 DIAGNOSIS — D571 Sickle-cell disease without crisis: Secondary | ICD-10-CM

## 2022-06-16 MED ORDER — OXYCODONE HCL 15 MG PO TABS: 15.0000 mg | ORAL_TABLET | Freq: Four times a day (QID) | ORAL | 0 refills | Status: DC | PRN

## 2022-06-16 NOTE — Telephone Encounter (Signed)
Reviewed PDMP substance reporting system prior to prescribing opiate medications. No inconsistencies noted.  Meds ordered this encounter  Medications   oxyCODONE (ROXICODONE) 15 MG immediate release tablet    Sig: Take 1 tablet (15 mg total) by mouth every 6 (six) hours as needed for up to 15 days for pain.    Dispense:  60 tablet    Refill:  0     [name]  APRN, MSN, FNP-C Patient [place] [address] [place], [place] [address] [phone]

## 2022-06-16 NOTE — Telephone Encounter (Signed)
From: [name] To: Office of [name], [place] Sent: 06/15/2022 4:57 PM EST Subject: Medication Renewal Request  Refills have been requested for the following medications:   oxyCODONE (ROXICODONE) 15 MG immediate release tablet [Sandra Mckay]  Preferred pharmacy: [facility], [place] Delivery method: [employer]

## 2022-06-16 NOTE — Telephone Encounter (Signed)
Integrated Behavioral Health General Follow Up Note  06/16/2022 Name: Qiara Minetti MRN: [id number] DOB: [birth year] Abriana Saltos is a 26 y.o. year old female who sees Dorena Dew, FNP for primary care. LCSW was initially consulted to assist the patient with Mental Health Counseling and Resources.  Interpreter: No.   Interpreter Name & Language: none  Assessment: Patient experiencing bipolar disorder and anxiety. [name] follows for Integrated Behavioral Health (IBH) counseling.  Ongoing Intervention: Scheduled patient for psychiatry at [facility] 06/29/22.  Review of patient status, including review of consultants reports, relevant laboratory and other test results, and collaboration with appropriate care team members and the patient's provider was performed as part of comprehensive patient evaluation and provision of services.    [name], [facility] [phone]

## 2022-06-19 ENCOUNTER — Other Ambulatory Visit: Payer: Self-pay | Admitting: Family Medicine

## 2022-06-19 ENCOUNTER — Telehealth: Payer: Self-pay | Admitting: Family Medicine

## 2022-06-19 DIAGNOSIS — D571 Sickle-cell disease without crisis: Secondary | ICD-10-CM

## 2022-06-19 MED ORDER — OXYCODONE HCL 15 MG PO TABS: 15.0000 mg | ORAL_TABLET | Freq: Four times a day (QID) | ORAL | 0 refills | Status: DC | PRN

## 2022-06-19 NOTE — Progress Notes (Signed)
Reviewed PDMP substance reporting system prior to prescribing opiate medications. No inconsistencies noted.  Meds ordered this encounter  Medications   oxyCODONE (ROXICODONE) 15 MG immediate release tablet    Sig: Take 1 tablet (15 mg total) by mouth every 6 (six) hours as needed for up to 15 days for pain.    Dispense:  60 tablet    Refill:  0    Order Specific Question:   Supervising Provider    Answer:   [name], [name]   Kadasia Kassing Moore Ava Deguire  APRN, MSN, FNP-C [facility] [address]  [place], [place] [address] [phone]  

## 2022-06-19 NOTE — Telephone Encounter (Signed)
Patient requests her Oxycodone RX be sent to [facility] [address] in [place] because [facility] is out of stock. [facility] needs diagnosis code on RX due to her being a new patient in their system.

## 2022-06-24 ENCOUNTER — Encounter: Payer: Self-pay | Admitting: Family Medicine

## 2022-06-29 ENCOUNTER — Ambulatory Visit (INDEPENDENT_AMBULATORY_CARE_PROVIDER_SITE_OTHER): Payer: Medicaid Other | Admitting: Clinical

## 2022-06-29 DIAGNOSIS — F419 Anxiety disorder, unspecified: Secondary | ICD-10-CM

## 2022-06-29 DIAGNOSIS — F319 Bipolar disorder, unspecified: Secondary | ICD-10-CM | POA: Diagnosis not present
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
## 2022-07-01 ENCOUNTER — Telehealth (HOSPITAL_COMMUNITY): Payer: Self-pay

## 2022-07-01 ENCOUNTER — Non-Acute Institutional Stay (HOSPITAL_COMMUNITY)
Admission: AD | Admit: 2022-07-01 | Discharge: 2022-07-01 | Disposition: A | Discharge status: Home / Self Care | Payer: Medicaid Other | Source: Ambulatory Visit | Attending: Internal Medicine | Admitting: Internal Medicine

## 2022-07-01 ENCOUNTER — Other Ambulatory Visit: Payer: Self-pay | Admitting: Family Medicine

## 2022-07-01 ENCOUNTER — Other Ambulatory Visit: Payer: Self-pay

## 2022-07-01 DIAGNOSIS — G894 Chronic pain syndrome: Secondary | ICD-10-CM | POA: Diagnosis not present

## 2022-07-01 DIAGNOSIS — Z9049 Acquired absence of other specified parts of digestive tract: Secondary | ICD-10-CM | POA: Insufficient documentation

## 2022-07-01 DIAGNOSIS — D5700 Hb-SS disease with crisis, unspecified: Secondary | ICD-10-CM

## 2022-07-01 DIAGNOSIS — F1120 Opioid dependence, uncomplicated: Secondary | ICD-10-CM | POA: Insufficient documentation

## 2022-07-01 DIAGNOSIS — D571 Sickle-cell disease without crisis: Secondary | ICD-10-CM

## 2022-07-01 DIAGNOSIS — Z202 Contact with and (suspected) exposure to infections with a predominantly sexual mode of transmission: Secondary | ICD-10-CM

## 2022-07-01 DIAGNOSIS — D638 Anemia in other chronic diseases classified elsewhere: Secondary | ICD-10-CM | POA: Diagnosis not present

## 2022-07-01 LAB — CBC WITH DIFFERENTIAL/PLATELET
Abs Immature Granulocytes: 0.01 10*3/uL (ref 0.00–0.07)
Basophils Absolute: 0 10*3/uL (ref 0.0–0.1)
Basophils Relative: 1 %
Eosinophils Absolute: 0 10*3/uL (ref 0.0–0.5)
Eosinophils Relative: 1 %
HCT: 22 % — ABNORMAL LOW (ref 36.0–46.0)
Hemoglobin: 7.7 g/dL — ABNORMAL LOW (ref 12.0–15.0)
Immature Granulocytes: 0 %
Lymphocytes Relative: 48 %
Lymphs Abs: 2.3 10*3/uL (ref 0.7–4.0)
MCH: 40.7 pg — ABNORMAL HIGH (ref 26.0–34.0)
MCHC: 35 g/dL (ref 30.0–36.0)
MCV: 116.4 fL — ABNORMAL HIGH (ref 80.0–100.0)
Monocytes Absolute: 0.7 10*3/uL (ref 0.1–1.0)
Monocytes Relative: 15 %
Neutro Abs: 1.6 10*3/uL — ABNORMAL LOW (ref 1.7–7.7)
Neutrophils Relative %: 35 %
Platelets: 344 10*3/uL (ref 150–400)
RBC: 1.89 MIL/uL — ABNORMAL LOW (ref 3.87–5.11)
RDW: 22.3 % — ABNORMAL HIGH (ref 11.5–15.5)
WBC: 4.7 10*3/uL (ref 4.0–10.5)
nRBC: 1.1 % — ABNORMAL HIGH (ref 0.0–0.2)

## 2022-07-01 LAB — COMPREHENSIVE METABOLIC PANEL
ALT: 32 U/L (ref 0–44)
AST: 50 U/L — ABNORMAL HIGH (ref 15–41)
Albumin: 4.2 g/dL (ref 3.5–5.0)
Alkaline Phosphatase: 86 U/L (ref 38–126)
Anion gap: 8 (ref 5–15)
BUN: 8 mg/dL (ref 6–20)
CO2: 22 mmol/L (ref 22–32)
Calcium: 8.8 mg/dL — ABNORMAL LOW (ref 8.9–10.3)
Chloride: 106 mmol/L (ref 98–111)
Creatinine, Ser: 0.4 mg/dL — ABNORMAL LOW (ref 0.44–1.00)
GFR, Estimated: >60 mL/min (ref >60)
Glucose, Bld: 97 mg/dL (ref 70–99)
Potassium: 4 mmol/L (ref 3.5–5.1)
Sodium: 136 mmol/L (ref 135–145)
Total Bilirubin: 2.1 mg/dL — ABNORMAL HIGH (ref 0.3–1.2)
Total Protein: 6.9 g/dL (ref 6.5–8.1)

## 2022-07-01 LAB — HEPATITIS PANEL, ACUTE
HCV Ab: NONREACTIVE
Hep A IgM: NONREACTIVE
Hep B C IgM: NONREACTIVE
Hepatitis B Surface Ag: NONREACTIVE

## 2022-07-01 LAB — URINALYSIS, ROUTINE W REFLEX MICROSCOPIC
Bilirubin Urine: NEGATIVE
Glucose, UA: NEGATIVE mg/dL
Hgb urine dipstick: NEGATIVE
Ketones, ur: NEGATIVE mg/dL
Leukocytes,Ua: NEGATIVE
Nitrite: NEGATIVE
Protein, ur: NEGATIVE mg/dL
Specific Gravity, Urine: 1.012 (ref 1.005–1.030)
pH: 6 (ref 5.0–8.0)

## 2022-07-01 LAB — RETICULOCYTES
Immature Retic Fract: 28.9 % — ABNORMAL HIGH (ref 2.3–15.9)
RBC.: 1.89 MIL/uL — ABNORMAL LOW (ref 3.87–5.11)
Retic Count, Absolute: 167.5 10*3/uL (ref 19.0–186.0)
Retic Ct Pct: 8.9 % — ABNORMAL HIGH (ref 0.4–3.1)

## 2022-07-01 LAB — TYPE AND SCREEN
ABO/RH(D): A POS
Antibody Screen: NEGATIVE

## 2022-07-01 LAB — HIV ANTIBODY (ROUTINE TESTING W REFLEX): HIV Screen 4th Generation wRfx: NONREACTIVE

## 2022-07-01 MED ORDER — SODIUM CHLORIDE 0.9% FLUSH: 9.0000 mL | INTRAVENOUS | Status: DC | PRN

## 2022-07-01 MED ORDER — SODIUM CHLORIDE 0.45 % IV SOLN: INTRAVENOUS | Status: DC

## 2022-07-01 MED ORDER — DIPHENHYDRAMINE HCL 25 MG PO CAPS
25.0000 mg | ORAL_CAPSULE | ORAL | Status: DC | PRN
  Administered 2022-07-01 (×2): 25 mg via ORAL
  Filled 2022-07-01 (×2): qty 1

## 2022-07-01 MED ORDER — ONDANSETRON HCL 4 MG/2ML IJ SOLN
4.0000 mg | Freq: Four times a day (QID) | INTRAMUSCULAR | Status: DC | PRN
  Administered 2022-07-01: 4 mg via INTRAVENOUS
  Filled 2022-07-01: qty 2

## 2022-07-01 MED ORDER — HYDROMORPHONE 1 MG/ML IV SOLN
INTRAVENOUS | Status: DC
  Administered 2022-07-01: 30 mg via INTRAVENOUS
  Administered 2022-07-01: 14.5 mg via INTRAVENOUS
  Filled 2022-07-01: qty 30

## 2022-07-01 MED ORDER — KETOROLAC TROMETHAMINE 30 MG/ML IJ SOLN
15.0000 mg | Freq: Once | INTRAMUSCULAR | Status: AC
  Administered 2022-07-01: 15 mg via INTRAVENOUS
  Filled 2022-07-01: qty 1

## 2022-07-01 MED ORDER — ACETAMINOPHEN 500 MG PO TABS
1000.0000 mg | ORAL_TABLET | Freq: Once | ORAL | Status: AC
  Administered 2022-07-01: 1000 mg via ORAL
  Filled 2022-07-01: qty 2

## 2022-07-01 MED ORDER — NALOXONE HCL 0.4 MG/ML IJ SOLN: 0.4000 mg | INTRAMUSCULAR | Status: DC | PRN

## 2022-07-01 NOTE — Progress Notes (Addendum)
Pt admitted to the day hospital by [place] Hollis FNP for treatment of sickle cell pain crisis. Pt reported generalized pain rated a 8/10. Pt given PO Benadryl and Tylenol, IV toradol and zofran,placed on Dilaudid PCA 0.5/10/3, and hydrated with 1/2 NS IV fluids.  After first PCA dose delivered via pt bolus, pt developed welts and swelling localized around the IV site, denies any respiratory distress, voices no other complaints and states that this issue happens occasionally after using the PCA and will resolve within a few minutes. [place] FNP notified, instructed to allow PCA to infuse and if site worsens will order IVPB benadryl.  Site monitored and resolved with oral benadryl, pt has no other complaints. Pt informed staff that she received a letter in the mail stating she was exposed to an STD, provider notified. STD screening labs collected per orders. At discharge patient reported pain a 6/10. Pt declined AVS.Pt alert , oriented and ambulatory at discharge.

## 2022-07-01 NOTE — BH Specialist Note (Signed)
Integrated Behavioral Health General Follow Up Note   07/01/2022 Name: Jiselle Sheu        MRN: [id number]       DOB: March 12, 1996 Emillia Weatherly is a 26 y.o. year old female who sees Dorena Dew, FNP for primary care. LCSW was initially consulted to assist the patient with Mental Health Counseling and Resources.   Interpreter: No.   Interpreter Name & Language: none   Assessment: Patient experiencing bipolar disorder and anxiety. [name] follows for Integrated Behavioral Health (IBH) counseling.   Ongoing Intervention: Met with patient at bedside in the day hospital. Patient emotional due to stress and pain. Supportive counseling provided. Follow up outpatient IBH appointment scheduled for 07/06/22.   Review of patient status, including review of consultants reports, relevant laboratory and other test results, and collaboration with appropriate care team members and the patient's provider was performed as part of comprehensive patient evaluation and provision of services.     [name], [facility] [phone]

## 2022-07-01 NOTE — Telephone Encounter (Signed)
Patient called in. Complains of pain in bilateral legs and lower back rates 8/10. Denied chest pain, abd pain, fever, N/V/D. Wants to come in for treatment. Last took Xtampza 13.'5mg'$  at 11pm. Pt states she hasn't been in the ED since October. Pt denies recent covid exposure or flu like symptoms. Pt states she has transportation other than herself. [place] FNP made aware, approved for pt to be seen in the day hospital. Pt notified, verbalized understanding.

## 2022-07-01 NOTE — Telephone Encounter (Signed)
Caller & Relationship to patient:  MRN #  [id number]   Call Back Number:   Date of Last Office Visit: 06/19/2022     Date of Next Office Visit: 07/06/2022    Medication(s) to be Refilled: oxy  Preferred [facility]acy: [facility]  ** Please notify patient to allow 48-72 hours to process** **Let patient know to contact pharmacy at the end of the day to make sure medication is ready. ** **If patient has not been seen in a year or longer, book an appointment **Advise to use MyChart for refill requests OR to contact their pharmacy

## 2022-07-01 NOTE — Discharge Summary (Signed)
[facility] Discharge Summary   Patient ID: Sandra Mckay MRN: [id number] DOB/AGE: March 14, 1996 26 y.o.  Admit date: 07/01/2022 Discharge date: 07/01/2022  Primary Care Physician:  Dorena Dew, FNP  Admission Diagnoses:  Active Problems:   Sickle cell pain crisis [facility]) Discharge Medications:  Allergies as of 07/01/2022       Reactions   Latex Rash   Wound Dressing Adhesive Rash        Medication List     TAKE these medications    albuterol 108 (90 Base) MCG/ACT inhaler Commonly known as: VENTOLIN HFA Inhale 3-4 puffs into the lungs 2 (two) times daily as needed for wheezing or shortness of breath.   folic acid 1 MG tablet Commonly known as: FOLVITE Take 1 tablet (1 mg total) by mouth daily.   gabapentin 100 MG capsule Commonly known as: NEURONTIN Take 3 capsules (300 mg total) by mouth 3 (three) times daily. What changed:  how much to take when to take this   hydroxyurea 500 MG capsule Commonly known as: HYDREA Take 3 capsules (1,500 mg total) by mouth daily. TAKE 3 CAPSULES (1,500 MG TOTAL) BY MOUTH DAILY. What changed: additional instructions   oxyCODONE 15 MG immediate release tablet Commonly known as: ROXICODONE Take 1 tablet (15 mg total) by mouth every 6 (six) hours as needed for up to 15 days for pain.   QUEtiapine 25 MG tablet Commonly known as: SEROQUEL TAKE 1 TABLET BY MOUTH EVERYDAY AT BEDTIME   rivaroxaban 20 MG Tabs tablet Commonly known as: XARELTO Take 1 tablet (20 mg total) by mouth daily with supper.   Xtampza ER 13.5 MG C12a Generic drug: oxyCODONE ER Take 13.5 mg by mouth every 12 (twelve) hours.         Consults:  None  Significant Diagnostic Studies:  No results found.  History of Present Illness: Sandra Mckay is a 26 year old female with a medical history significant for sickle cell disease, chronic pain syndrome, opiate dependence and tolerance, and history of anemia of chronic disease  presents with complaints of bilateral hip and lower extremity pain. Patient states that pain intensity has been increasing over the past 4 days and unrelieved by her home medications.  Patient last had Xtampza last night without very much relief.  Pain intensity is 9/10, aching, and throbbing.  Patient states that pain has not been very well-controlled on home medication regimen and is requesting to increase medication frequency.  Also, patient states possible exposure to STDs.  She is not symptomatic at this time, but is requesting STD testing.  She denies any fever, chills, abdominal pain, chest pain, or shortness of breath.  No urinary symptoms, nausea, vomiting, or diarrhea.  Of note, patient is very tearful today.  [facility] Course: Patient admitted to sickle cell clinic for management of pain crisis.  Reviewed all laboratory values. Hemoglobin 7.7, consistent with baseline Pain managed with IV dilaudid PCA Toradol 15 mg x1 Tylenol 1000 mg x 1 IV fluids, 0.45% saline at 100 ml/hr Pain intensity decreased throughout admission. Patient alert, oriented, and ambulating without assistance. She will be discharged home in a hemodynamically stable condition.    Discharge instructions:  Resume all home medications.   Follow up with PCP as previously  scheduled.   Discussed the importance of drinking 64 ounces of water daily, dehydration of red blood cells may lead further sickling.   Avoid all stressors that precipitate sickle cell pain crisis.  The patient was given clear instructions to go to ER or return to medical center if symptoms do not improve, worsen or new problems develop.   Physical Exam at Discharge:  BP 116/72 (BP Location: Left Arm)   Pulse 67   Temp 98.6 F (37 C) (Temporal)   Resp 12   SpO2 100%    Physical Exam Constitutional:      Appearance: Normal appearance.  Eyes:     Pupils: Pupils are equal, round, and reactive to light.  Cardiovascular:      Rate and Rhythm: Normal rate and regular rhythm.     Pulses: Normal pulses.  Pulmonary:     Effort: Pulmonary effort is normal.  Abdominal:     General: Abdomen is flat. Bowel sounds are normal.  Musculoskeletal:        General: Normal range of motion.  Skin:    General: Skin is warm.  Neurological:     General: No focal deficit present.     Mental Status: She is alert. Mental status is at baseline.  Psychiatric:        Mood and Affect: Mood normal.        Thought Content: Thought content normal.        Judgment: Judgment normal.     Disposition at Discharge: Discharge disposition: 01-Home or Self Care       Discharge Orders: Discharge Instructions     Discharge patient   Complete by: As directed    Discharge disposition: 01-Home or Self Care   Discharge patient date: 07/01/2022       Condition at Discharge:   Stable  Time spent on Discharge:  Greater than 30 minutes.  Signed: [name]  APRN, MSN, FNP-C [facility] [address] [place], [place] [address] [phone]  07/01/2022, 4:32 PM
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
## 2022-07-02 LAB — GC/CHLAMYDIA PROBE AMP (~~LOC~~) NOT AT ARMC
Chlamydia: NEGATIVE
Comment: NEGATIVE
Comment: NORMAL
Neisseria Gonorrhea: NEGATIVE

## 2022-07-02 LAB — RPR: RPR Ser Ql: NONREACTIVE

## 2022-07-03 ENCOUNTER — Telehealth: Payer: Self-pay | Admitting: Clinical

## 2022-07-03 ENCOUNTER — Telehealth: Payer: Self-pay | Admitting: Family Medicine

## 2022-07-03 ENCOUNTER — Other Ambulatory Visit: Payer: Self-pay | Admitting: Internal Medicine

## 2022-07-03 ENCOUNTER — Other Ambulatory Visit: Payer: Self-pay | Admitting: Family Medicine

## 2022-07-03 DIAGNOSIS — D571 Sickle-cell disease without crisis: Secondary | ICD-10-CM

## 2022-07-03 DIAGNOSIS — G894 Chronic pain syndrome: Secondary | ICD-10-CM

## 2022-07-03 LAB — HSV(HERPES SIMPLEX VRS) I + II AB-IGG
HSV 1 Glycoprotein G Ab, IgG: <0.91 index (ref 0.00–0.90)
HSV 2 Glycoprotein G Ab, IgG: <0.91 index (ref 0.00–0.90)

## 2022-07-03 MED ORDER — XTAMPZA ER 13.5 MG PO C12A: 13.5000 mg | EXTENDED_RELEASE_CAPSULE | Freq: Two times a day (BID) | ORAL | 0 refills | Status: DC

## 2022-07-03 MED ORDER — OXYCODONE HCL 15 MG PO TABS: 15.0000 mg | ORAL_TABLET | Freq: Four times a day (QID) | ORAL | 0 refills | Status: DC | PRN

## 2022-07-03 NOTE — Telephone Encounter (Signed)
Integrated Behavioral Health General Follow Up Note  07/03/2022 Name: Haliey Romberg MRN: [id number] DOB: 1996/03/08 Nya Monds is a 26 y.o. year old female who sees Dorena Dew, FNP for primary care. LCSW was initially consulted to assist the patient with Mental Health Counseling and Resources.  Interpreter: No.   Interpreter Name & Language: none  Assessment: Patient experiencing bipolar disorder and anxiety. [name] follows for Integrated Behavioral Health (IBH) counseling.  Ongoing Intervention: Patient reached out today for check in and assistance. Reports she started Prozac from her psychiatrist earlier this week. She is concerned because she has experienced an increase in suicidal thoughts since then. She denies a plan for suicide and denies any intention of harming herself. Called patient's psychiatry office and reported this issue to nurse, who advised that since patient just started the medication this week, that is unlikely the cause of increased SI.   Patient received message on MyChart that her medication refill request had been denied. [name] coordinated with MD, who refilled patient's medication.   Confirmed IBH appointment with patient for 07/06/22; patient plans to attend this appointment.   Review of patient status, including review of consultants reports, relevant laboratory and other test results, and collaboration with appropriate care team members and the patient's provider was performed as part of comprehensive patient evaluation and provision of services.    [name], [facility] [phone]

## 2022-07-03 NOTE — Telephone Encounter (Signed)
Patient received notification that her pain meds were denied. Patient states she is in pain.  She is out of both Xtampza and Oxycodone Immediate Release. The immediate release shows due for refill on Saturday.  Please advise if these meds can be refilled.

## 2022-07-03 NOTE — Telephone Encounter (Signed)
Please advise when sent in. Thanks [employer]

## 2022-07-06 ENCOUNTER — Ambulatory Visit (INDEPENDENT_AMBULATORY_CARE_PROVIDER_SITE_OTHER): Payer: Medicaid Other | Admitting: Clinical

## 2022-07-06 DIAGNOSIS — F319 Bipolar disorder, unspecified: Secondary | ICD-10-CM | POA: Diagnosis not present

## 2022-07-06 DIAGNOSIS — F419 Anxiety disorder, unspecified: Secondary | ICD-10-CM
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
## 2022-07-15 ENCOUNTER — Other Ambulatory Visit: Payer: Self-pay | Admitting: Family Medicine

## 2022-07-15 DIAGNOSIS — D571 Sickle-cell disease without crisis: Secondary | ICD-10-CM

## 2022-07-15 MED ORDER — OXYCODONE HCL 15 MG PO TABS: 15.0000 mg | ORAL_TABLET | Freq: Four times a day (QID) | ORAL | 0 refills | Status: DC | PRN

## 2022-07-15 NOTE — Telephone Encounter (Signed)
From: [name] To: Office of Angelica Chessman, MD Sent: 07/15/2022 9:27 AM EST Subject: Medication Renewal Request  Refills have been requested for the following medications:   oxyCODONE (ROXICODONE) 15 MG immediate release tablet [Olugbemiga [name]]  [facility] pharmacy[id number] [place] Delivery method: PArlyss Gandy

## 2022-07-15 NOTE — Telephone Encounter (Signed)
Reviewed PDMP substance reporting system prior to prescribing opiate medications. No inconsistencies noted.  Meds ordered this encounter  Medications   oxyCODONE (ROXICODONE) 15 MG immediate release tablet    Sig: Take 1 tablet (15 mg total) by mouth every 6 (six) hours as needed for up to 15 days for pain.    Dispense:  60 tablet    Refill:  0    Order Specific Question:   Supervising Provider    Answer:   [name], [name]   Aryn Safran Moore Jaunice Mirza  APRN, MSN, FNP-C [facility] [address]  [place], [place] [address] [phone]  

## 2022-07-17 ENCOUNTER — Ambulatory Visit (INDEPENDENT_AMBULATORY_CARE_PROVIDER_SITE_OTHER): Payer: Medicaid Other | Admitting: Clinical

## 2022-07-17 DIAGNOSIS — F419 Anxiety disorder, unspecified: Secondary | ICD-10-CM

## 2022-07-17 DIAGNOSIS — F319 Bipolar disorder, unspecified: Secondary | ICD-10-CM | POA: Diagnosis not present
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
## 2022-07-31 ENCOUNTER — Ambulatory Visit (INDEPENDENT_AMBULATORY_CARE_PROVIDER_SITE_OTHER): Payer: Medicaid Other | Admitting: Clinical

## 2022-07-31 DIAGNOSIS — F319 Bipolar disorder, unspecified: Secondary | ICD-10-CM | POA: Diagnosis not present

## 2022-07-31 DIAGNOSIS — F419 Anxiety disorder, unspecified: Secondary | ICD-10-CM
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
## 2022-08-04 ENCOUNTER — Other Ambulatory Visit: Payer: Self-pay | Admitting: Family Medicine

## 2022-08-04 ENCOUNTER — Telehealth: Payer: Self-pay | Admitting: Family Medicine

## 2022-08-04 DIAGNOSIS — D571 Sickle-cell disease without crisis: Secondary | ICD-10-CM

## 2022-08-04 MED ORDER — OXYCODONE HCL 15 MG PO TABS: 15.0000 mg | ORAL_TABLET | Freq: Four times a day (QID) | ORAL | 0 refills | Status: DC | PRN

## 2022-08-04 NOTE — Telephone Encounter (Signed)
From: [name] To: Office of [name], [place] Sent: 08/02/2022 10:31 AM EST Subject: Medication Renewal Request  Refills have been requested for the following medications:   oxyCODONE (ROXICODONE) 15 MG immediate release tablet [Garnetta Fedrick]  Patient Comment: Was due 12/31 hasn't been sent. Thank you   [id number] [place] Delivery method: PArlyss Gandy

## 2022-08-04 NOTE — Telephone Encounter (Signed)
Caller & Relationship to patient:  self MRN #  [id number]   Call Back Number: [id number]  Date of Last Office Visit: 07/15/2022     Date of Next Office Visit: 08/05/2022    Medication(s) to be Refilled:  Jamelle Rushing  Oxycodone '15mg'$   Preferred Pharmacy:   ** Please notify patient to allow 48-72 hours to process** **Let patient know to contact pharmacy at the end of the day to make sure medication is ready. ** **If patient has not been seen in a year or longer, book an appointment **Advise to use MyChart for refill requests OR to contact their pharmacy

## 2022-08-04 NOTE — Telephone Encounter (Signed)
Reviewed PDMP substance reporting system prior to prescribing opiate medications. No inconsistencies noted.  Meds ordered this encounter  Medications   oxyCODONE (ROXICODONE) 15 MG immediate release tablet    Sig: Take 1 tablet (15 mg total) by mouth every 6 (six) hours as needed for up to 15 days for pain.    Dispense:  60 tablet    Refill:  0    Order Specific Question:   Supervising Provider    Answer:   [name], [name]   Alicianna Litchford Moore Chun Sellen  APRN, MSN, FNP-C [facility] [address]  [place], [place] [address] [phone]  

## 2022-08-05 ENCOUNTER — Ambulatory Visit (INDEPENDENT_AMBULATORY_CARE_PROVIDER_SITE_OTHER): Payer: Medicaid Other | Admitting: Clinical

## 2022-08-05 DIAGNOSIS — F319 Bipolar disorder, unspecified: Secondary | ICD-10-CM

## 2022-08-05 DIAGNOSIS — F419 Anxiety disorder, unspecified: Secondary | ICD-10-CM
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
## 2022-08-10 ENCOUNTER — Ambulatory Visit (INDEPENDENT_AMBULATORY_CARE_PROVIDER_SITE_OTHER): Payer: Medicaid Other | Admitting: Clinical

## 2022-08-10 ENCOUNTER — Telehealth: Payer: Self-pay | Admitting: Clinical

## 2022-08-10 DIAGNOSIS — F319 Bipolar disorder, unspecified: Secondary | ICD-10-CM

## 2022-08-10 DIAGNOSIS — F419 Anxiety disorder, unspecified: Secondary | ICD-10-CM
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
## 2022-08-10 NOTE — Telephone Encounter (Signed)
Patient requesting refill of Xtampza

## 2022-08-11 ENCOUNTER — Other Ambulatory Visit: Payer: Self-pay | Admitting: Family Medicine

## 2022-08-11 DIAGNOSIS — D571 Sickle-cell disease without crisis: Secondary | ICD-10-CM

## 2022-08-11 DIAGNOSIS — G894 Chronic pain syndrome: Secondary | ICD-10-CM

## 2022-08-11 MED ORDER — XTAMPZA ER 13.5 MG PO C12A: 13.5000 mg | EXTENDED_RELEASE_CAPSULE | Freq: Two times a day (BID) | ORAL | 0 refills | Status: DC

## 2022-08-11 NOTE — Progress Notes (Signed)
Reviewed PDMP substance reporting system prior to prescribing opiate medications. No inconsistencies noted.  Meds ordered this encounter  Medications   oxyCODONE ER (XTAMPZA ER) 13.5 MG C12A    Sig: Take 13.5 mg by mouth every 12 (twelve) hours.    Dispense:  30 capsule    Refill:  0    Order Specific Question:   Supervising Provider    Answer:   Tresa Garter [[id number]]   [name]  APRN, MSN, FNP-C Patient [place] [address] [place], [place] [address] [phone]

## 2022-08-13 ENCOUNTER — Encounter (HOSPITAL_COMMUNITY): Payer: Self-pay | Admitting: Family Medicine

## 2022-08-13 ENCOUNTER — Non-Acute Institutional Stay (HOSPITAL_COMMUNITY)
Admission: AD | Admit: 2022-08-13 | Discharge: 2022-08-13 | Disposition: A | Discharge status: Home / Self Care | Payer: Medicaid Other | Source: Ambulatory Visit | Attending: Internal Medicine | Admitting: Internal Medicine

## 2022-08-13 ENCOUNTER — Telehealth (HOSPITAL_COMMUNITY): Payer: Self-pay

## 2022-08-13 DIAGNOSIS — Z79899 Other long term (current) drug therapy: Secondary | ICD-10-CM | POA: Insufficient documentation

## 2022-08-13 DIAGNOSIS — I272 Pulmonary hypertension, unspecified: Secondary | ICD-10-CM | POA: Insufficient documentation

## 2022-08-13 DIAGNOSIS — F32A Depression, unspecified: Secondary | ICD-10-CM | POA: Insufficient documentation

## 2022-08-13 DIAGNOSIS — D5700 Hb-SS disease with crisis, unspecified: Secondary | ICD-10-CM | POA: Diagnosis not present

## 2022-08-13 DIAGNOSIS — G894 Chronic pain syndrome: Secondary | ICD-10-CM | POA: Insufficient documentation

## 2022-08-13 DIAGNOSIS — D638 Anemia in other chronic diseases classified elsewhere: Secondary | ICD-10-CM | POA: Insufficient documentation

## 2022-08-13 DIAGNOSIS — Z7901 Long term (current) use of anticoagulants: Secondary | ICD-10-CM | POA: Diagnosis not present

## 2022-08-13 DIAGNOSIS — F1120 Opioid dependence, uncomplicated: Secondary | ICD-10-CM | POA: Diagnosis not present

## 2022-08-13 LAB — CBC WITH DIFFERENTIAL/PLATELET
Abs Immature Granulocytes: 0.03 10*3/uL (ref 0.00–0.07)
Basophils Absolute: 0.1 10*3/uL (ref 0.0–0.1)
Basophils Relative: 1 %
Eosinophils Absolute: 0 10*3/uL (ref 0.0–0.5)
Eosinophils Relative: 0 %
HCT: 23.2 % — ABNORMAL LOW (ref 36.0–46.0)
Hemoglobin: 8.4 g/dL — ABNORMAL LOW (ref 12.0–15.0)
Immature Granulocytes: 0 %
Lymphocytes Relative: 34 %
Lymphs Abs: 2.3 10*3/uL (ref 0.7–4.0)
MCH: 43.1 pg — ABNORMAL HIGH (ref 26.0–34.0)
MCHC: 36.2 g/dL — ABNORMAL HIGH (ref 30.0–36.0)
MCV: 119 fL — ABNORMAL HIGH (ref 80.0–100.0)
Monocytes Absolute: 1.5 10*3/uL — ABNORMAL HIGH (ref 0.1–1.0)
Monocytes Relative: 21 %
Neutro Abs: 3 10*3/uL (ref 1.7–7.7)
Neutrophils Relative %: 44 %
Platelets: 389 10*3/uL (ref 150–400)
RBC: 1.95 MIL/uL — ABNORMAL LOW (ref 3.87–5.11)
RDW: 17 % — ABNORMAL HIGH (ref 11.5–15.5)
WBC: 6.8 10*3/uL (ref 4.0–10.5)
nRBC: 0.7 % — ABNORMAL HIGH (ref 0.0–0.2)

## 2022-08-13 LAB — COMPREHENSIVE METABOLIC PANEL
ALT: 46 U/L — ABNORMAL HIGH (ref 0–44)
AST: 45 U/L — ABNORMAL HIGH (ref 15–41)
Albumin: 3.8 g/dL (ref 3.5–5.0)
Alkaline Phosphatase: 94 U/L (ref 38–126)
Anion gap: 7 (ref 5–15)
BUN: 10 mg/dL (ref 6–20)
CO2: 23 mmol/L (ref 22–32)
Calcium: 9.1 mg/dL (ref 8.9–10.3)
Chloride: 106 mmol/L (ref 98–111)
Creatinine, Ser: 0.43 mg/dL — ABNORMAL LOW (ref 0.44–1.00)
GFR, Estimated: >60 mL/min (ref >60)
Glucose, Bld: 93 mg/dL (ref 70–99)
Potassium: 4.2 mmol/L (ref 3.5–5.1)
Sodium: 136 mmol/L (ref 135–145)
Total Bilirubin: 1.5 mg/dL — ABNORMAL HIGH (ref 0.3–1.2)
Total Protein: 7.1 g/dL (ref 6.5–8.1)

## 2022-08-13 LAB — PREGNANCY, URINE: Preg Test, Ur: NEGATIVE

## 2022-08-13 LAB — RAPID URINE DRUG SCREEN, HOSP PERFORMED
Amphetamines: NOT DETECTED
Barbiturates: NOT DETECTED
Benzodiazepines: NOT DETECTED
Cocaine: NOT DETECTED
Opiates: NOT DETECTED
Tetrahydrocannabinol: POSITIVE — AB

## 2022-08-13 LAB — RETICULOCYTES
Immature Retic Fract: 37.7 % — ABNORMAL HIGH (ref 2.3–15.9)
RBC.: 1.98 MIL/uL — ABNORMAL LOW (ref 3.87–5.11)
Retic Count, Absolute: 275 10*3/uL — ABNORMAL HIGH (ref 19.0–186.0)
Retic Ct Pct: 13.9 % — ABNORMAL HIGH (ref 0.4–3.1)

## 2022-08-13 LAB — LACTATE DEHYDROGENASE: LDH: 303 U/L — ABNORMAL HIGH (ref 98–192)

## 2022-08-13 MED ORDER — ACETAMINOPHEN 500 MG PO TABS
1000.0000 mg | ORAL_TABLET | Freq: Once | ORAL | Status: AC
  Administered 2022-08-13: 1000 mg via ORAL
  Filled 2022-08-13: qty 2

## 2022-08-13 MED ORDER — SODIUM CHLORIDE 0.9% FLUSH: 9.0000 mL | INTRAVENOUS | Status: DC | PRN

## 2022-08-13 MED ORDER — KETOROLAC TROMETHAMINE 30 MG/ML IJ SOLN
15.0000 mg | Freq: Once | INTRAMUSCULAR | Status: AC
  Administered 2022-08-13: 15 mg via INTRAVENOUS
  Filled 2022-08-13: qty 1

## 2022-08-13 MED ORDER — NALOXONE HCL 0.4 MG/ML IJ SOLN: 0.4000 mg | INTRAMUSCULAR | Status: DC | PRN

## 2022-08-13 MED ORDER — DIPHENHYDRAMINE HCL 25 MG PO CAPS
25.0000 mg | ORAL_CAPSULE | ORAL | Status: DC | PRN
  Filled 2022-08-13: qty 1

## 2022-08-13 MED ORDER — HYDROMORPHONE 1 MG/ML IV SOLN
INTRAVENOUS | Status: DC
  Administered 2022-08-13: 11 mg via INTRAVENOUS
  Administered 2022-08-13: 30 mg via INTRAVENOUS
  Filled 2022-08-13: qty 30

## 2022-08-13 MED ORDER — ONDANSETRON HCL 4 MG/2ML IJ SOLN
4.0000 mg | Freq: Four times a day (QID) | INTRAMUSCULAR | Status: DC | PRN
  Administered 2022-08-13: 4 mg via INTRAVENOUS
  Filled 2022-08-13: qty 2

## 2022-08-13 MED ORDER — SODIUM CHLORIDE 0.45 % IV SOLN: INTRAVENOUS | Status: DC

## 2022-08-13 NOTE — Progress Notes (Addendum)
Patient admitted to the day infusion hospital for sickle cell pain. Patient initially reported bilateral hip and knee pain rated 8/10. For pain management, patient placed on Dilaudid PCA, given 15 mg IV Toradol, 1000 mg Tylenol and hydrated with IV fluids. At discharge, patient rated pain at 6/10. Vital signs stable. AVS offered but patient refused. Patient alert, oriented and ambulatory at discharge.

## 2022-08-13 NOTE — Telephone Encounter (Signed)
Pt called the day hospital looking to come in today for sickle cell pain treatment. Pt rates 8/10 pain to bilateral knees and hips. Pt reports taking Oxycodone 15 mg at 8 PM last night. Pt screened for COVID and denies symptoms and exposure. Pt denies fever, chest pain, N/V/D, and abdominal pain. [place], [place] notified and said pt can come in today. Pt notified and verbalized understanding. Pt states that she has transportation without driving herself.
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
## 2022-08-13 NOTE — Discharge Summary (Signed)
[facility] Discharge Summary   Patient ID: Sandra Mckay MRN: [id number] DOB/AGE: [birth year] 27 y.o.  Admit date: 08/13/2022 Discharge date: 08/13/2022  Primary Care Physician:  Dorena Dew, FNP  Admission Diagnoses:  Active Problems:   Sickle cell pain crisis [facility])   Discharge Medications:  Allergies as of 08/13/2022       Reactions   Latex Rash   Wound Dressing Adhesive Rash        Medication List     TAKE these medications    albuterol 108 (90 Base) MCG/ACT inhaler Commonly known as: VENTOLIN HFA Inhale 3-4 puffs into the lungs 2 (two) times daily as needed for wheezing or shortness of breath.   folic acid 1 MG tablet Commonly known as: FOLVITE Take 1 tablet (1 mg total) by mouth daily.   gabapentin 100 MG capsule Commonly known as: NEURONTIN Take 3 capsules (300 mg total) by mouth 3 (three) times daily. What changed:  how much to take when to take this   hydroxyurea 500 MG capsule Commonly known as: HYDREA Take 3 capsules (1,500 mg total) by mouth daily. TAKE 3 CAPSULES (1,500 MG TOTAL) BY MOUTH DAILY. What changed: additional instructions   oxyCODONE 15 MG immediate release tablet Commonly known as: ROXICODONE Take 1 tablet (15 mg total) by mouth every 6 (six) hours as needed for up to 15 days for pain.   QUEtiapine 25 MG tablet Commonly known as: SEROQUEL TAKE 1 TABLET BY MOUTH EVERYDAY AT BEDTIME   rivaroxaban 20 MG Tabs tablet Commonly known as: XARELTO Take 1 tablet (20 mg total) by mouth daily with supper.   Xtampza ER 13.5 MG C12a Generic drug: oxyCODONE ER Take 13.5 mg by mouth every 12 (twelve) hours.         Consults:  None  Significant Diagnostic Studies:  No results found.  History of Present Illness: Sandra Mckay is a 27 year old female with a medical history significant for sickle cell disease, chronic pain syndrome, opiate dependence, opiate tolerance, depression, and anemia of chronic disease  presents with complaints of generalized pain that is consistent with her typical sickle cell crisis.  Patient states that pain intensity has been elevated over the past several days and unrelieved by her home medications.  Patient last had oxycodone on last night without very much relief.  She has not identified any provocative factors concerning current crisis.  Current pain intensity is 8/10, intermittent, and aching.  The patient denies fever, chills, chest pain, shortness of breath, urinary symptoms, nausea, vomiting, or diarrhea. [facility] Course: Pain crisis. Reviewed all laboratory values, largely consistent with patient's baseline. Pain managed with IV Dilaudid PCA, IV Toradol, IV fluids, Tylenol. Throughout admission, pain intensity decreased and patient is requesting discharge home.  She is alert, oriented, and ambulating without assistance.  Patient will discharge home in a hemodynamically stable condition.  Discharge instructions: Resume all home medications.   Follow up with PCP as previously  scheduled.   Discussed the importance of drinking 64 ounces of water daily, dehydration of red blood cells may lead further sickling.   Avoid all stressors that precipitate sickle cell pain crisis.     The patient was given clear instructions to go to ER or return to medical center if symptoms do not improve, worsen or new problems develop.   Physical Exam at Discharge:  BP 114/68 (BP Location: Right Arm)   Pulse 69   Temp 98.4 F (36.9 C) (Temporal)   Resp 11  LMP 08/10/2022   SpO2 97%  Physical Exam Constitutional:      Appearance: Normal appearance.  Eyes:     Pupils: Pupils are equal, round, and reactive to light.  Cardiovascular:     Rate and Rhythm: Normal rate and regular rhythm.     Pulses: Normal pulses.  Pulmonary:     Effort: Pulmonary effort is normal.  Abdominal:     General: Bowel sounds are normal.  Musculoskeletal:        General: Normal  range of motion.  Skin:    General: Skin is warm.  Neurological:     General: No focal deficit present.     Mental Status: She is alert.  Psychiatric:        Mood and Affect: Mood normal.        Behavior: Behavior normal.        Thought Content: Thought content normal.        Judgment: Judgment normal.       Disposition at Discharge: Discharge disposition: 01-Home or Self Care       Discharge Orders:   Condition at Discharge:   Stable  Time spent on Discharge:  Greater than 30 minutes.  Signed: [name]  APRN, MSN, FNP-C [facility] [address] [place], [place] [address] [phone]  08/13/2022, 4:54 PM

## 2022-08-14 ENCOUNTER — Other Ambulatory Visit: Payer: Self-pay

## 2022-08-14 DIAGNOSIS — G894 Chronic pain syndrome: Secondary | ICD-10-CM

## 2022-08-14 DIAGNOSIS — D571 Sickle-cell disease without crisis: Secondary | ICD-10-CM

## 2022-08-14 NOTE — Telephone Encounter (Signed)
Pharmacy is requesting  a alternative . Please advise [facility]

## 2022-08-17 ENCOUNTER — Other Ambulatory Visit: Payer: Self-pay

## 2022-08-17 DIAGNOSIS — D571 Sickle-cell disease without crisis: Secondary | ICD-10-CM

## 2022-08-17 NOTE — Telephone Encounter (Signed)
From: [name] To: Office of [name], [place] Sent: 08/16/2022 6:30 PM EST Subject: Medication Renewal Request  Refills have been requested for the following medications:   oxyCODONE (ROXICODONE) 15 MG immediate release tablet [Lachina Hollis]  [facility]erred pharmacy[id number] [address] Delivery method: PArlyss Gandy

## 2022-08-18 MED ORDER — OXYCODONE HCL 15 MG PO TABS: 15.0000 mg | ORAL_TABLET | Freq: Four times a day (QID) | ORAL | 0 refills | Status: DC | PRN

## 2022-08-18 MED ORDER — XTAMPZA ER 13.5 MG PO C12A: 13.5000 mg | EXTENDED_RELEASE_CAPSULE | Freq: Two times a day (BID) | ORAL | 0 refills | Status: DC

## 2022-08-24 ENCOUNTER — Encounter: Payer: Medicaid Other | Admitting: Clinical

## 2022-09-01 ENCOUNTER — Other Ambulatory Visit: Payer: Self-pay | Admitting: Family Medicine

## 2022-09-01 DIAGNOSIS — G894 Chronic pain syndrome: Secondary | ICD-10-CM

## 2022-09-01 DIAGNOSIS — D571 Sickle-cell disease without crisis: Secondary | ICD-10-CM

## 2022-09-01 MED ORDER — OXYCODONE HCL 15 MG PO TABS: 15.0000 mg | ORAL_TABLET | Freq: Four times a day (QID) | ORAL | 0 refills | Status: DC | PRN

## 2022-09-01 MED ORDER — XTAMPZA ER 13.5 MG PO C12A: 13.5000 mg | EXTENDED_RELEASE_CAPSULE | Freq: Two times a day (BID) | ORAL | 0 refills | Status: DC

## 2022-09-01 NOTE — Telephone Encounter (Signed)
Reviewed PDMP substance reporting system prior to prescribing opiate medications. No inconsistencies noted.  Meds ordered this encounter  Medications   oxyCODONE ER (XTAMPZA ER) 13.5 MG C12A    Sig: Take 13.5 mg by mouth every 12 (twelve) hours.    Dispense:  30 capsule    Refill:  0    Order Specific Question:   Supervising Provider    Answer:   [name], [name]   oxyCODONE (ROXICODONE) 15 MG immediate release tablet    Sig: Take 1 tablet (15 mg total) by mouth every 6 (six) hours as needed for up to 15 days for pain.    Dispense:  60 tablet    Refill:  0    Order Specific Question:   Supervising Provider    Answer:   [name], [name]   Mauri Tolen Moore Brittnae Aschenbrenner  APRN, MSN, FNP-C [facility] [address]  [place], [place] [address] [phone]  

## 2022-09-01 NOTE — Telephone Encounter (Signed)
From: [name] To: Office of [name], [place] Sent: 08/31/2022 11:29 PM EST Subject: Medication Renewal Request  Refills have been requested for the following medications:   oxyCODONE ER (XTAMPZA ER) 13.5 MG C12A [Tanith Dagostino]   oxyCODONE (ROXICODONE) 15 MG immediate release tablet [Yui Mulvaney]  [facility]erred pharmacy: [id number] [address] [place] [address] Delivery method: [employer]

## 2022-09-08 ENCOUNTER — Ambulatory Visit (INDEPENDENT_AMBULATORY_CARE_PROVIDER_SITE_OTHER): Payer: Medicaid Other | Admitting: Clinical

## 2022-09-08 ENCOUNTER — Ambulatory Visit: Payer: Self-pay | Admitting: Family Medicine

## 2022-09-08 DIAGNOSIS — F419 Anxiety disorder, unspecified: Secondary | ICD-10-CM

## 2022-09-08 DIAGNOSIS — F319 Bipolar disorder, unspecified: Secondary | ICD-10-CM | POA: Diagnosis not present

## 2022-09-08 NOTE — BH Specialist Note (Unsigned)
Parenting classes in [place] - transportation may be a barrier. Do they have virtual?  Next appt with [place] in April - hard to discuss where I am pain wise. Last time I said my meds weren't helping, they cut it in half. Then nothing after that. Thought we could do the injection, but that hasn't happened yet. I pretty much have the same stance I had six months ago, I'm afraid they'll take more away. I'm afraid if I tell the truth, it won't be beneficial to me.   Maybe I did need a little time to back off the quantity Six months later there's no improvement, is that just how its gonna be?   Hesitant to discuss with [place]. Since injection not working out right now, I need another alternative. When I think about revisiting that conversation, I don't see it going well at all.   Long acting not being covered by insurance  Legs and hips the worst, feel like it won't be long before I can't walk on my own

## 2022-09-10 ENCOUNTER — Telehealth: Payer: Self-pay

## 2022-09-10 NOTE — Telephone Encounter (Signed)
Phramacy is requesting to have a alternative filled for pt Xtampza. Pease advise if a PA was started for this medication.

## 2022-09-11 ENCOUNTER — Telehealth: Payer: Self-pay

## 2022-09-11 ENCOUNTER — Other Ambulatory Visit: Payer: Self-pay

## 2022-09-11 NOTE — Telephone Encounter (Signed)
Xtampza prior auth was submitted to ins today via CoverMyMeds Key: [id number]  Approved until 12/10/2022. MyChart message sent to patient.

## 2022-09-14 ENCOUNTER — Non-Acute Institutional Stay (HOSPITAL_COMMUNITY)
Admission: AD | Admit: 2022-09-14 | Discharge: 2022-09-14 | Disposition: A | Discharge status: Home / Self Care | Payer: Medicaid Other | Source: Ambulatory Visit | Attending: Internal Medicine | Admitting: Internal Medicine

## 2022-09-14 ENCOUNTER — Other Ambulatory Visit: Payer: Self-pay | Admitting: Family Medicine

## 2022-09-14 ENCOUNTER — Telehealth (HOSPITAL_COMMUNITY): Payer: Self-pay

## 2022-09-14 DIAGNOSIS — F17210 Nicotine dependence, cigarettes, uncomplicated: Secondary | ICD-10-CM | POA: Insufficient documentation

## 2022-09-14 DIAGNOSIS — F32A Depression, unspecified: Secondary | ICD-10-CM | POA: Diagnosis not present

## 2022-09-14 DIAGNOSIS — D5700 Hb-SS disease with crisis, unspecified: Secondary | ICD-10-CM | POA: Diagnosis not present

## 2022-09-14 DIAGNOSIS — D571 Sickle-cell disease without crisis: Secondary | ICD-10-CM

## 2022-09-14 DIAGNOSIS — Z86711 Personal history of pulmonary embolism: Secondary | ICD-10-CM | POA: Insufficient documentation

## 2022-09-14 DIAGNOSIS — Z7901 Long term (current) use of anticoagulants: Secondary | ICD-10-CM | POA: Insufficient documentation

## 2022-09-14 DIAGNOSIS — G894 Chronic pain syndrome: Secondary | ICD-10-CM

## 2022-09-14 DIAGNOSIS — Z79899 Other long term (current) drug therapy: Secondary | ICD-10-CM | POA: Insufficient documentation

## 2022-09-14 DIAGNOSIS — F1120 Opioid dependence, uncomplicated: Secondary | ICD-10-CM | POA: Diagnosis not present

## 2022-09-14 DIAGNOSIS — D638 Anemia in other chronic diseases classified elsewhere: Secondary | ICD-10-CM | POA: Insufficient documentation

## 2022-09-14 LAB — COMPREHENSIVE METABOLIC PANEL
ALT: 55 U/L — ABNORMAL HIGH (ref 0–44)
AST: 52 U/L — ABNORMAL HIGH (ref 15–41)
Albumin: 4.9 g/dL (ref 3.5–5.0)
Alkaline Phosphatase: 98 U/L (ref 38–126)
Anion gap: 8 (ref 5–15)
BUN: 9 mg/dL (ref 6–20)
CO2: 23 mmol/L (ref 22–32)
Calcium: 9.6 mg/dL (ref 8.9–10.3)
Chloride: 110 mmol/L (ref 98–111)
Creatinine, Ser: 0.42 mg/dL — ABNORMAL LOW (ref 0.44–1.00)
GFR, Estimated: >60 mL/min (ref >60)
Glucose, Bld: 94 mg/dL (ref 70–99)
Potassium: 4 mmol/L (ref 3.5–5.1)
Sodium: 141 mmol/L (ref 135–145)
Total Bilirubin: 2 mg/dL — ABNORMAL HIGH (ref 0.3–1.2)
Total Protein: 7.8 g/dL (ref 6.5–8.1)

## 2022-09-14 LAB — CBC WITH DIFFERENTIAL/PLATELET
Abs Immature Granulocytes: 0.03 10*3/uL (ref 0.00–0.07)
Basophils Absolute: 0.1 10*3/uL (ref 0.0–0.1)
Basophils Relative: 1 %
Eosinophils Absolute: 0.1 10*3/uL (ref 0.0–0.5)
Eosinophils Relative: 1 %
HCT: 22.5 % — ABNORMAL LOW (ref 36.0–46.0)
Hemoglobin: 7.9 g/dL — ABNORMAL LOW (ref 12.0–15.0)
Immature Granulocytes: 0 %
Lymphocytes Relative: 32 %
Lymphs Abs: 2.2 10*3/uL (ref 0.7–4.0)
MCH: 40.5 pg — ABNORMAL HIGH (ref 26.0–34.0)
MCHC: 35.1 g/dL (ref 30.0–36.0)
MCV: 115.4 fL — ABNORMAL HIGH (ref 80.0–100.0)
Monocytes Absolute: 1.5 10*3/uL — ABNORMAL HIGH (ref 0.1–1.0)
Monocytes Relative: 21 %
Neutro Abs: 3.1 10*3/uL (ref 1.7–7.7)
Neutrophils Relative %: 45 %
Platelets: 431 10*3/uL — ABNORMAL HIGH (ref 150–400)
RBC: 1.95 MIL/uL — ABNORMAL LOW (ref 3.87–5.11)
RDW: 16.8 % — ABNORMAL HIGH (ref 11.5–15.5)
WBC: 6.9 10*3/uL (ref 4.0–10.5)
nRBC: 1.7 % — ABNORMAL HIGH (ref 0.0–0.2)

## 2022-09-14 LAB — RETICULOCYTES
Immature Retic Fract: 36.4 % — ABNORMAL HIGH (ref 2.3–15.9)
RBC.: 1.92 MIL/uL — ABNORMAL LOW (ref 3.87–5.11)
Retic Count, Absolute: 383.4 10*3/uL — ABNORMAL HIGH (ref 19.0–186.0)
Retic Ct Pct: 20 % — ABNORMAL HIGH (ref 0.4–3.1)

## 2022-09-14 LAB — PREGNANCY, URINE: Preg Test, Ur: NEGATIVE

## 2022-09-14 MED ORDER — XTAMPZA ER 13.5 MG PO C12A: 13.5000 mg | EXTENDED_RELEASE_CAPSULE | Freq: Two times a day (BID) | ORAL | 0 refills | Status: DC

## 2022-09-14 MED ORDER — DIPHENHYDRAMINE HCL 25 MG PO CAPS: 25.0000 mg | ORAL_CAPSULE | ORAL | Status: DC | PRN

## 2022-09-14 MED ORDER — SODIUM CHLORIDE 0.45 % IV SOLN: INTRAVENOUS | Status: DC

## 2022-09-14 MED ORDER — HYDROMORPHONE 1 MG/ML IV SOLN
INTRAVENOUS | Status: DC
  Administered 2022-09-14: 15 mg via INTRAVENOUS
  Administered 2022-09-14: 30 mg via INTRAVENOUS
  Filled 2022-09-14: qty 30

## 2022-09-14 MED ORDER — KETOROLAC TROMETHAMINE 30 MG/ML IJ SOLN
15.0000 mg | Freq: Once | INTRAMUSCULAR | Status: AC
  Administered 2022-09-14: 15 mg via INTRAVENOUS
  Filled 2022-09-14: qty 1

## 2022-09-14 MED ORDER — SODIUM CHLORIDE 0.9% FLUSH: 9.0000 mL | INTRAVENOUS | Status: DC | PRN

## 2022-09-14 MED ORDER — ONDANSETRON HCL 4 MG/2ML IJ SOLN: 4.0000 mg | Freq: Four times a day (QID) | INTRAMUSCULAR | Status: DC | PRN

## 2022-09-14 MED ORDER — ACETAMINOPHEN 500 MG PO TABS
1000.0000 mg | ORAL_TABLET | Freq: Once | ORAL | Status: AC
  Administered 2022-09-14: 1000 mg via ORAL
  Filled 2022-09-14: qty 2

## 2022-09-14 MED ORDER — NALOXONE HCL 0.4 MG/ML IJ SOLN: 0.4000 mg | INTRAMUSCULAR | Status: DC | PRN

## 2022-09-14 MED ORDER — OXBRYTA 500 MG PO TABS: 1000.0000 mg | ORAL_TABLET | Freq: Every day | ORAL | 2 refills | Status: DC
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
## 2022-09-14 NOTE — Telephone Encounter (Signed)
Prior approval has been done good through 12/10/22. Please fill when due. Thanks  [employer]

## 2022-09-14 NOTE — Telephone Encounter (Signed)
Pt called day hospital looking to come in today for sickle cell pain treatment. Pt states having 8/10 pain to knees and hips. Pt states that they last took Oxycodone 15 mg yesterday. Pt states that they are out of pain medication, and that their extended release medication is no longer covered by pts insurance. Pt denies being to ER. Pt screened for COVID and denies symptoms and exposure. Pt denies fever, chest pain, N/V, and abdominal pain. Pt states that she has been having some diarrhea that started yesterday. [place], [place] notified and she said pt can come to day hospital today for treatment. Pt notified and verbalized understanding. Pt states that [employer] will be her transportation today.

## 2022-09-14 NOTE — BH Specialist Note (Signed)
Integrated Behavioral Health Progress Note  09/14/2022 Name: Geralene Frommer MRN: [id number] DOB: [birth year] Ife Crapser is a 27 y.o. year old female who sees Dorena Dew, FNP for primary care. LCSW was initially consulted to assist the patient with Mental Health Counseling and Resources.   Interpreter: No.   Interpreter Name & Language: none  Assessment: Patient experiencing bipolar disorder and anxiety. [name] follows for Integrated Behavioral Health (IBH) counseling.   Ongoing Intervention: Today [name] met with patient at bedside in day hospital. Brief check in with patient regarding challenges with pain management and medication, as well as stress from single parenting. Last week advised patient of a free parenting class and sent her the registration info; patient remains interested in this and plans to register. [name] will also plan to coordinate with patient's PCP on sickle cell management medication options, as patient had wanted to start adakveo.  Review of patient status, including review of consultants reports, relevant laboratory and other test results, and collaboration with appropriate care team members and the patient's provider was performed as part of comprehensive patient evaluation and provision of services.    [name], [facility] [phone]

## 2022-09-14 NOTE — Discharge Summary (Signed)
[facility] Discharge Summary   Patient ID: Sandra Mckay MRN: [id number] DOB/AGE: 1996-05-28 27 y.o.  Admit date: 09/14/2022 Discharge date: 09/14/2022  Primary Care Physician:  Dorena Dew, FNP  Admission Diagnoses:  Active Problems:   Sickle cell pain crisis [facility])   Discharge Medications:  Allergies as of 09/14/2022       Reactions   Latex Rash   Wound Dressing Adhesive Rash        Medication List     TAKE these medications    albuterol 108 (90 Base) MCG/ACT inhaler Commonly known as: VENTOLIN HFA Inhale 3-4 puffs into the lungs 2 (two) times daily as needed for wheezing or shortness of breath.   folic acid 1 MG tablet Commonly known as: FOLVITE Take 1 tablet (1 mg total) by mouth daily.   gabapentin 100 MG capsule Commonly known as: NEURONTIN Take 3 capsules (300 mg total) by mouth 3 (three) times daily. What changed:  how much to take when to take this   hydroxyurea 500 MG capsule Commonly known as: HYDREA Take 3 capsules (1,500 mg total) by mouth daily. TAKE 3 CAPSULES (1,500 MG TOTAL) BY MOUTH DAILY. What changed: additional instructions   Oxbryta 500 MG Tabs tablet Generic drug: voxelotor Take 1,000 mg by mouth daily.   oxyCODONE 15 MG immediate release tablet Commonly known as: ROXICODONE Take 1 tablet (15 mg total) by mouth every 6 (six) hours as needed for up to 15 days for pain.   QUEtiapine 25 MG tablet Commonly known as: SEROQUEL TAKE 1 TABLET BY MOUTH EVERYDAY AT BEDTIME   rivaroxaban 20 MG Tabs tablet Commonly known as: XARELTO Take 1 tablet (20 mg total) by mouth daily with supper.   Xtampza ER 13.5 MG C12a Generic drug: oxyCODONE ER Take 13.5 mg by mouth every 12 (twelve) hours.         Consults:  None  Significant Diagnostic Studies:  No results found.  History of present illness:  Sydnye Mckay is a 27 year old female with a medical history significant for sickle cell disease, chronic pain  syndrome, opiate dependence and tolerance, history of anemia of chronic disease and history of pulmonary embolism presents with complaints of generalized pain that is consistent with her typical pain crisis.  Patient attributes pain crisis to increase stressors.  Current pain intensity is 8/10, constant, and "sore".  Patient last had oxycodone this a.m. without sustained relief.  She states that she has been out of Xtampza over the past several weeks due to insurance constraints.  Patient denies any fever, chills, headache, chest pains, or shortness of breath.  No urinary symptoms, nausea, vomiting diarrhea.  No sick contacts, recent travel, or known exposure to COVID-19. [facility] Course: Patient admitted to sickle cell day infusion clinic for management of pain crisis. Reviewed all laboratory values, largely consistent with patient's baseline. Pain managed with IV Dilaudid PCA, IV fluids, IV Toradol, and Tylenol intensity decreased throughout admission and patient will be discharged home. She is alert, oriented, and ambulating without assistance.  Patient will be discharged home in a hemodynamically stable condition. Patient's prior authorization was approved for Xtampza, copy of prescription provided for patient to present to her pharmacy.  Discharge instructions: Resume all home medications.   Follow up with PCP as previously  scheduled.   Discussed the importance of drinking 64 ounces of water daily, dehydration of red blood cells may lead further sickling.   Avoid all stressors that precipitate sickle cell pain crisis.  The patient was given clear instructions to go to ER or return to medical center if symptoms do not improve, worsen or new problems develop.    Physical Exam at Discharge:  BP 106/60 (BP Location: Right Arm)   Pulse 73   Temp 98.3 F (36.8 C) (Temporal)   Resp 14   LMP 09/07/2022   SpO2 95%   Physical Exam Constitutional:      Appearance:  Normal appearance.  Eyes:     Pupils: Pupils are equal, round, and reactive to light.  Cardiovascular:     Rate and Rhythm: Normal rate and regular rhythm.     Pulses: Normal pulses.  Pulmonary:     Effort: Pulmonary effort is normal.  Abdominal:     General: Bowel sounds are normal.  Musculoskeletal:        General: Normal range of motion.  Skin:    General: Skin is warm.  Neurological:     General: No focal deficit present.  Psychiatric:        Mood and Affect: Mood normal.        Thought Content: Thought content normal.        Judgment: Judgment normal.      Disposition at Discharge: Discharge disposition: 01-Home or Self Care       Discharge Orders: Discharge Instructions     Discharge patient   Complete by: As directed    Discharge disposition: 01-Home or Self Care   Discharge patient date: 09/14/2022       Condition at Discharge:   Stable  Time spent on Discharge:  Greater than 30 minutes.  Signed: [name]  APRN, MSN, FNP-C [facility] [address] [place], [place] [address] [phone]  09/14/2022, 3:41 PM

## 2022-09-14 NOTE — Progress Notes (Signed)
Patient admitted to the day infusion hospital for sickle cell pain crisis. Initially, patient reported bilateral hip and knee pain rated 8/10. For pain management, patient placed on Dilaudid PCA, given 15 mg Toradol, 1000 mg Tylenol and hydrated with IV fluids. At discharge, patient rated pain at 5/10. Vital signs stable. AVS offered but patient refused. Patient alert, oriented and ambulatory at discharge.

## 2022-09-16 ENCOUNTER — Telehealth: Payer: Self-pay | Admitting: Family Medicine

## 2022-09-16 ENCOUNTER — Other Ambulatory Visit: Payer: Self-pay | Admitting: Family Medicine

## 2022-09-16 DIAGNOSIS — D571 Sickle-cell disease without crisis: Secondary | ICD-10-CM

## 2022-09-16 MED ORDER — OXYCODONE HCL 15 MG PO TABS: 15.0000 mg | ORAL_TABLET | Freq: Four times a day (QID) | ORAL | 0 refills | Status: DC | PRN

## 2022-09-16 NOTE — Progress Notes (Signed)
Reviewed PDMP substance reporting system prior to prescribing opiate medications. No inconsistencies noted.  Meds ordered this encounter  Medications   oxyCODONE (ROXICODONE) 15 MG immediate release tablet    Sig: Take 1 tablet (15 mg total) by mouth every 6 (six) hours as needed for up to 15 days for pain.    Dispense:  60 tablet    Refill:  0    Order Specific Question:   Supervising Provider    Answer:   Tresa Garter [id number]   [name]  APRN, MSN, FNP-C [facility] [address] [place], [place] [address] [phone]

## 2022-09-16 NOTE — Telephone Encounter (Signed)
09/16/22 LVM on nurse line [facility] requesting clarification on patient's Oxbryta prescription. RX written for 1000 mg daily, which is 2 tablets, but quantity on RX is 90 tabs.  Should quantity be 60 for 30 day supply? Please call to clarify [phone]

## 2022-09-18 ENCOUNTER — Telehealth: Payer: Self-pay | Admitting: Clinical

## 2022-09-18 NOTE — Telephone Encounter (Signed)
Patient attended [facility]) sickle cell support group via virtual format.   Sandra Mckay, [name] [phone]

## 2022-09-22 ENCOUNTER — Ambulatory Visit (INDEPENDENT_AMBULATORY_CARE_PROVIDER_SITE_OTHER): Payer: Medicaid Other | Admitting: Clinical

## 2022-09-22 DIAGNOSIS — F319 Bipolar disorder, unspecified: Secondary | ICD-10-CM | POA: Diagnosis not present

## 2022-09-22 DIAGNOSIS — F419 Anxiety disorder, unspecified: Secondary | ICD-10-CM
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
## 2022-09-29 ENCOUNTER — Other Ambulatory Visit: Payer: Self-pay

## 2022-09-29 ENCOUNTER — Other Ambulatory Visit: Payer: Self-pay | Admitting: Family Medicine

## 2022-09-29 ENCOUNTER — Telehealth: Payer: Self-pay

## 2022-09-29 DIAGNOSIS — G894 Chronic pain syndrome: Secondary | ICD-10-CM

## 2022-09-29 DIAGNOSIS — D571 Sickle-cell disease without crisis: Secondary | ICD-10-CM

## 2022-09-29 MED ORDER — XTAMPZA ER 13.5 MG PO C12A: 13.5000 mg | EXTENDED_RELEASE_CAPSULE | Freq: Two times a day (BID) | ORAL | 0 refills | Status: DC

## 2022-09-29 MED ORDER — OXYCODONE HCL 15 MG PO TABS: 15.0000 mg | ORAL_TABLET | Freq: Four times a day (QID) | ORAL | 0 refills | Status: DC | PRN

## 2022-09-29 NOTE — Telephone Encounter (Signed)
OXBRYTA PRIOR AUTH HAS BEEN APPROVED UNTIL 09/26/2023. [facility] WAS ABLE TO PROCESS A CLAIM FOR $0 AND WILL CONTACT PATIENT TO SCHEDULE SHIPMENT.

## 2022-09-29 NOTE — Progress Notes (Signed)
Reviewed PDMP substance reporting system prior to prescribing opiate medications. No inconsistencies noted.  Meds ordered this encounter  Medications   oxyCODONE ER (XTAMPZA ER) 13.5 MG C12A    Sig: Take 13.5 mg by mouth every 12 (twelve) hours.    Dispense:  30 capsule    Refill:  0    Order Specific Question:   Supervising Provider    Answer:   Tresa Garter [id number]   oxyCODONE (ROXICODONE) 15 MG immediate release tablet    Sig: Take 1 tablet (15 mg total) by mouth every 6 (six) hours as needed for up to 15 days for pain.    Dispense:  60 tablet    Refill:  0    Order Specific Question:   Supervising Provider    Answer:   Tresa Garter [id number]   [name]  APRN, MSN, FNP-C [facility] [address] [place], [place] [address] [phone]

## 2022-10-01 ENCOUNTER — Telehealth (HOSPITAL_COMMUNITY): Payer: Self-pay | Admitting: *Deleted

## 2022-10-01 ENCOUNTER — Non-Acute Institutional Stay (HOSPITAL_COMMUNITY)
Admission: AD | Admit: 2022-10-01 | Discharge: 2022-10-01 | Disposition: A | Discharge status: Home / Self Care | Payer: Medicaid Other | Source: Ambulatory Visit | Attending: Internal Medicine | Admitting: Internal Medicine

## 2022-10-01 DIAGNOSIS — D638 Anemia in other chronic diseases classified elsewhere: Secondary | ICD-10-CM | POA: Insufficient documentation

## 2022-10-01 DIAGNOSIS — Z79899 Other long term (current) drug therapy: Secondary | ICD-10-CM | POA: Insufficient documentation

## 2022-10-01 DIAGNOSIS — G894 Chronic pain syndrome: Secondary | ICD-10-CM | POA: Diagnosis not present

## 2022-10-01 DIAGNOSIS — F1120 Opioid dependence, uncomplicated: Secondary | ICD-10-CM | POA: Diagnosis not present

## 2022-10-01 DIAGNOSIS — D5700 Hb-SS disease with crisis, unspecified: Secondary | ICD-10-CM | POA: Diagnosis present

## 2022-10-01 LAB — COMPREHENSIVE METABOLIC PANEL
ALT: 35 U/L (ref 0–44)
AST: 36 U/L (ref 15–41)
Albumin: 4.5 g/dL (ref 3.5–5.0)
Alkaline Phosphatase: 69 U/L (ref 38–126)
Anion gap: 6 (ref 5–15)
BUN: 8 mg/dL (ref 6–20)
CO2: 23 mmol/L (ref 22–32)
Calcium: 8.9 mg/dL (ref 8.9–10.3)
Chloride: 106 mmol/L (ref 98–111)
Creatinine, Ser: 0.42 mg/dL — ABNORMAL LOW (ref 0.44–1.00)
GFR, Estimated: >60 mL/min (ref >60)
Glucose, Bld: 124 mg/dL — ABNORMAL HIGH (ref 70–99)
Potassium: 3.9 mmol/L (ref 3.5–5.1)
Sodium: 135 mmol/L (ref 135–145)
Total Bilirubin: 1.9 mg/dL — ABNORMAL HIGH (ref 0.3–1.2)
Total Protein: 7.3 g/dL (ref 6.5–8.1)

## 2022-10-01 LAB — CBC WITH DIFFERENTIAL/PLATELET
Abs Immature Granulocytes: 0.03 10*3/uL (ref 0.00–0.07)
Basophils Absolute: 0.1 10*3/uL (ref 0.0–0.1)
Basophils Relative: 1 %
Eosinophils Absolute: 0.1 10*3/uL (ref 0.0–0.5)
Eosinophils Relative: 1 %
HCT: 21.2 % — ABNORMAL LOW (ref 36.0–46.0)
Hemoglobin: 7.4 g/dL — ABNORMAL LOW (ref 12.0–15.0)
Immature Granulocytes: 0 %
Lymphocytes Relative: 27 %
Lymphs Abs: 2.3 10*3/uL (ref 0.7–4.0)
MCH: 40.4 pg — ABNORMAL HIGH (ref 26.0–34.0)
MCHC: 34.9 g/dL (ref 30.0–36.0)
MCV: 115.8 fL — ABNORMAL HIGH (ref 80.0–100.0)
Monocytes Absolute: 1.6 10*3/uL — ABNORMAL HIGH (ref 0.1–1.0)
Monocytes Relative: 19 %
Neutro Abs: 4.3 10*3/uL (ref 1.7–7.7)
Neutrophils Relative %: 52 %
Platelets: 403 10*3/uL — ABNORMAL HIGH (ref 150–400)
RBC: 1.83 MIL/uL — ABNORMAL LOW (ref 3.87–5.11)
RDW: 17.1 % — ABNORMAL HIGH (ref 11.5–15.5)
WBC: 8.4 10*3/uL (ref 4.0–10.5)
nRBC: 2 % — ABNORMAL HIGH (ref 0.0–0.2)

## 2022-10-01 LAB — RETICULOCYTES
Immature Retic Fract: 32.2 % — ABNORMAL HIGH (ref 2.3–15.9)
RBC.: 1.85 MIL/uL — ABNORMAL LOW (ref 3.87–5.11)
Retic Count, Absolute: 377 10*3/uL — ABNORMAL HIGH (ref 19.0–186.0)
Retic Ct Pct: 19.8 % — ABNORMAL HIGH (ref 0.4–3.1)

## 2022-10-01 LAB — URINALYSIS, ROUTINE W REFLEX MICROSCOPIC
Bilirubin Urine: NEGATIVE
Glucose, UA: NEGATIVE mg/dL
Hgb urine dipstick: NEGATIVE
Ketones, ur: NEGATIVE mg/dL
Leukocytes,Ua: NEGATIVE
Nitrite: NEGATIVE
Protein, ur: NEGATIVE mg/dL
Specific Gravity, Urine: 1.009 (ref 1.005–1.030)
pH: 6 (ref 5.0–8.0)

## 2022-10-01 LAB — PREGNANCY, URINE: Preg Test, Ur: NEGATIVE

## 2022-10-01 MED ORDER — KETOROLAC TROMETHAMINE 30 MG/ML IJ SOLN
15.0000 mg | Freq: Once | INTRAMUSCULAR | Status: AC
  Administered 2022-10-01: 15 mg via INTRAVENOUS
  Filled 2022-10-01: qty 1

## 2022-10-01 MED ORDER — NALOXONE HCL 0.4 MG/ML IJ SOLN: 0.4000 mg | INTRAMUSCULAR | Status: DC | PRN

## 2022-10-01 MED ORDER — HYDROMORPHONE 1 MG/ML IV SOLN
INTRAVENOUS | Status: DC
  Administered 2022-10-01: 12.5 mg via INTRAVENOUS
  Administered 2022-10-01: 30 mg via INTRAVENOUS
  Filled 2022-10-01: qty 30

## 2022-10-01 MED ORDER — DIPHENHYDRAMINE HCL 25 MG PO CAPS
25.0000 mg | ORAL_CAPSULE | ORAL | Status: DC | PRN
  Administered 2022-10-01: 25 mg via ORAL
  Filled 2022-10-01: qty 1

## 2022-10-01 MED ORDER — ONDANSETRON HCL 4 MG/2ML IJ SOLN: 4.0000 mg | Freq: Four times a day (QID) | INTRAMUSCULAR | Status: DC | PRN

## 2022-10-01 MED ORDER — SODIUM CHLORIDE 0.45 % IV SOLN: INTRAVENOUS | Status: DC

## 2022-10-01 MED ORDER — SODIUM CHLORIDE 0.9% FLUSH: 9.0000 mL | INTRAVENOUS | Status: DC | PRN

## 2022-10-01 MED ORDER — ACETAMINOPHEN 500 MG PO TABS
1000.0000 mg | ORAL_TABLET | Freq: Once | ORAL | Status: AC
  Administered 2022-10-01: 1000 mg via ORAL
  Filled 2022-10-01: qty 2

## 2022-10-01 NOTE — Discharge Summary (Signed)
[facility] Discharge Summary   Patient ID: Sandra Mckay MRN: [id number] DOB/AGE: 31-Mar-1996 27 y.o.  Admit date: 10/01/2022 Discharge date: 10/01/2022  Primary Care Physician:  Dorena Dew, FNP  Admission Diagnoses:  Active Problems:   Sickle cell pain crisis [facility])  Discharge Medications:  Allergies as of 10/01/2022       Reactions   Latex Rash   Wound Dressing Adhesive Rash        Medication List     TAKE these medications    albuterol 108 (90 Base) MCG/ACT inhaler Commonly known as: VENTOLIN HFA Inhale 3-4 puffs into the lungs 2 (two) times daily as needed for wheezing or shortness of breath.   folic acid 1 MG tablet Commonly known as: FOLVITE Take 1 tablet (1 mg total) by mouth daily.   gabapentin 100 MG capsule Commonly known as: NEURONTIN Take 3 capsules (300 mg total) by mouth 3 (three) times daily. What changed:  how much to take when to take this   hydroxyurea 500 MG capsule Commonly known as: HYDREA Take 3 capsules (1,500 mg total) by mouth daily. TAKE 3 CAPSULES (1,500 MG TOTAL) BY MOUTH DAILY. What changed: additional instructions   Oxbryta 500 MG Tabs tablet Generic drug: voxelotor Take 1,000 mg by mouth daily.   oxyCODONE 15 MG immediate release tablet Commonly known as: ROXICODONE Take 1 tablet (15 mg total) by mouth every 6 (six) hours as needed for up to 15 days for pain. Start taking on: October 02, 2022   QUEtiapine 25 MG tablet Commonly known as: SEROQUEL TAKE 1 TABLET BY MOUTH EVERYDAY AT BEDTIME   rivaroxaban 20 MG Tabs tablet Commonly known as: XARELTO Take 1 tablet (20 mg total) by mouth daily with supper.   Xtampza ER 13.5 MG C12a Generic drug: oxyCODONE ER Take 13.5 mg by mouth every 12 (twelve) hours. Start taking on: October 02, 2022         Consults:  None  Significant Diagnostic Studies:  No results found.  History of Present Illness: Sandra Mckay is a 27 year old female with a medical  history significant for sickle cell disease, chronic pain syndrome, opiate dependence and tolerance, and anemia of chronic disease presents with complaints of generalized pain. Pain is consistent with previous sickle cell pain crisis. Patient says that pain intensity has been increasing over the past 24 hours. Patient says that she is out of her short acting medication. Patient last had Xtampza this am without relief. Current pain intensity is 6/10, intermittent and sore. Patient denies headache, chest pain, shortness of breath, nausea, vomiting, or diarrhea. No sick contact, recent travel, or known exposure to COVID-19.  [facility] Course: Patient admitted to sickle cell day infusion clinic for management of pain crisis.  Reviewed patient's labs, consistent with her baseline. Patient's hemoglobin is 7.4 g/dL, she is typically not transfused unless hemoglobin is less than 6. Pain managed with IV Dilaudid PCA Toradol 15 mg IV x 1 Tylenol 1000 mg IV fluids, 0.45% saline at 100 mL/h Pain intensity decreased to 4/10 and patient is requesting discharge home.  She is alert, oriented, and ambulating without assistance.  Patient will discharge home in a hemodynamically stable condition.   Discharge instructions: Resume all home medications.   Follow up with PCP as previously  scheduled.   Discussed the importance of drinking 64 ounces of water daily, dehydration of red blood cells may lead further sickling.   Avoid all stressors that precipitate sickle cell  pain crisis.     The patient was given clear instructions to go to ER or return to medical center if symptoms do not improve, worsen or new problems develop.   Physical Exam at Discharge:  BP (!) 104/57 (BP Location: Left Arm)   Pulse 66   Temp 98.2 F (36.8 C) (Temporal)   Resp 14   LMP  (LMP Unknown)   SpO2 100%   Physical Exam Constitutional:      Appearance: Normal appearance.  Eyes:     Pupils: Pupils are equal,  round, and reactive to light.  Cardiovascular:     Rate and Rhythm: Normal rate.  Pulmonary:     Effort: Pulmonary effort is normal.  Abdominal:     General: Bowel sounds are normal.  Skin:    General: Skin is warm.  Neurological:     General: No focal deficit present.     Mental Status: She is alert. Mental status is at baseline.  Psychiatric:        Mood and Affect: Mood normal.        Thought Content: Thought content normal.        Judgment: Judgment normal.       Disposition at Discharge: Discharge disposition: 01-Home or Self Care       Discharge Orders: Discharge Instructions     Discharge patient   Complete by: As directed    Discharge disposition: 01-Home or Self Care   Discharge patient date: 10/01/2022       Condition at Discharge:   Stable  Time spent on Discharge:  Greater than 30 minutes.  Signed: [name]  APRN, MSN, FNP-C [facility] [address] [place], [place] [address] [phone]  10/01/2022, 3:40 PM
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
## 2022-10-01 NOTE — Progress Notes (Signed)
Pt admitted to day hospital today for sickle cell pain treatment. On arrival, pt reports 7/10 pain to bilateral hips and knees. Pt received Dilaudid PCA, IV Toradol 15 mg, and hydrated with IV fluids via PIV. Pt also received PO Benadryl 25 mg. At discharge, pt rated 4/10 pain. AVS offered, but pt declined. Pt is alert, oriented, and ambulatory at discharge.

## 2022-10-01 NOTE — Telephone Encounter (Signed)
Patient called requesting to come to the day hospital for sickle cell pain. Patient reports bilateral hip and knee pain rated 7/10. Reports taking Xtampza last night for pain. Patient is out of her short acting pain medication but she will be able to pick it up from pharmacy tomorrow. COVID-19 screening done and patient denies all symptoms and exposures. Admits to some nausea but denies vomiting, chest pain, fever, diarrhea and abdominal pain. Admits to having transportation without driving herself. Per patient, her mother will pick her up at discharge.  [place], [place] notified. Patient can come to the day hospital for pain management. Patient advised and expresses an understanding.

## 2022-10-05 ENCOUNTER — Ambulatory Visit (INDEPENDENT_AMBULATORY_CARE_PROVIDER_SITE_OTHER): Payer: Medicaid Other | Admitting: Clinical

## 2022-10-05 ENCOUNTER — Telehealth: Payer: Self-pay | Admitting: Clinical

## 2022-10-05 DIAGNOSIS — F319 Bipolar disorder, unspecified: Secondary | ICD-10-CM | POA: Diagnosis not present

## 2022-10-05 DIAGNOSIS — F419 Anxiety disorder, unspecified: Secondary | ICD-10-CM
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
## 2022-10-05 NOTE — Telephone Encounter (Signed)
Patient attended [facility]) sickle cell support group via virtual format.   [name], [facility] [phone]

## 2022-10-15 ENCOUNTER — Other Ambulatory Visit: Payer: Self-pay

## 2022-10-15 DIAGNOSIS — D571 Sickle-cell disease without crisis: Secondary | ICD-10-CM

## 2022-10-15 MED ORDER — OXYCODONE HCL 15 MG PO TABS: 15.0000 mg | ORAL_TABLET | Freq: Four times a day (QID) | ORAL | 0 refills | Status: AC | PRN

## 2022-10-15 NOTE — Telephone Encounter (Signed)
Please advise KH 

## 2022-10-15 NOTE — Telephone Encounter (Signed)
From: [name] To: Office of [name], [place] Sent: 10/15/2022 2:50 PM EDT Subject: Medication Renewal Request  Refills have been requested for the following medications:   oxyCODONE (ROXICODONE) 15 MG immediate release tablet [[place] Hollis]  [facility]erred pharmacy: [id number] [address] Delivery method: [employer]

## 2022-10-18 IMAGING — DX DG HIP (WITH OR WITHOUT PELVIS) 3-4V BILAT
5 series · 5 of 5 positions shown · non-contrast
Comparison: None.

CLINICAL DATA: Chronic bilateral hip pain without trauma

EXAM:
DG HIP (WITH OR WITHOUT PELVIS) 3-4V BILAT

[pelvis ap]
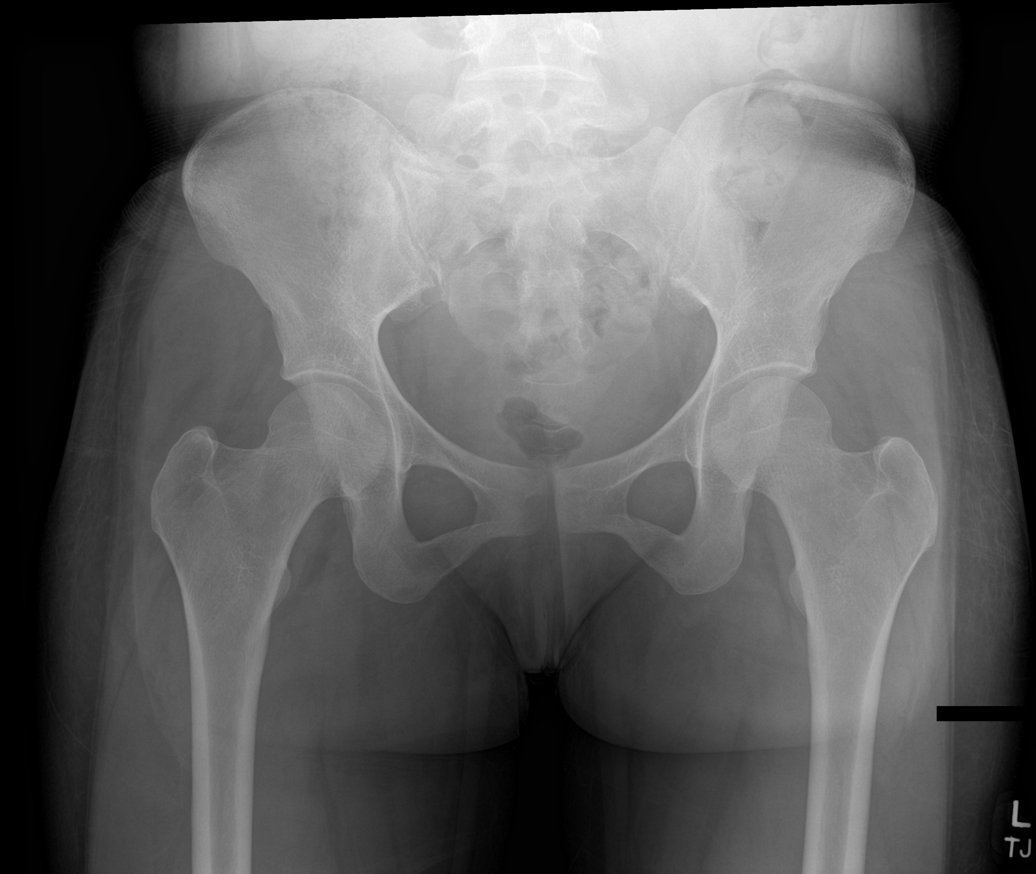

[hip ap (1 of 2)]
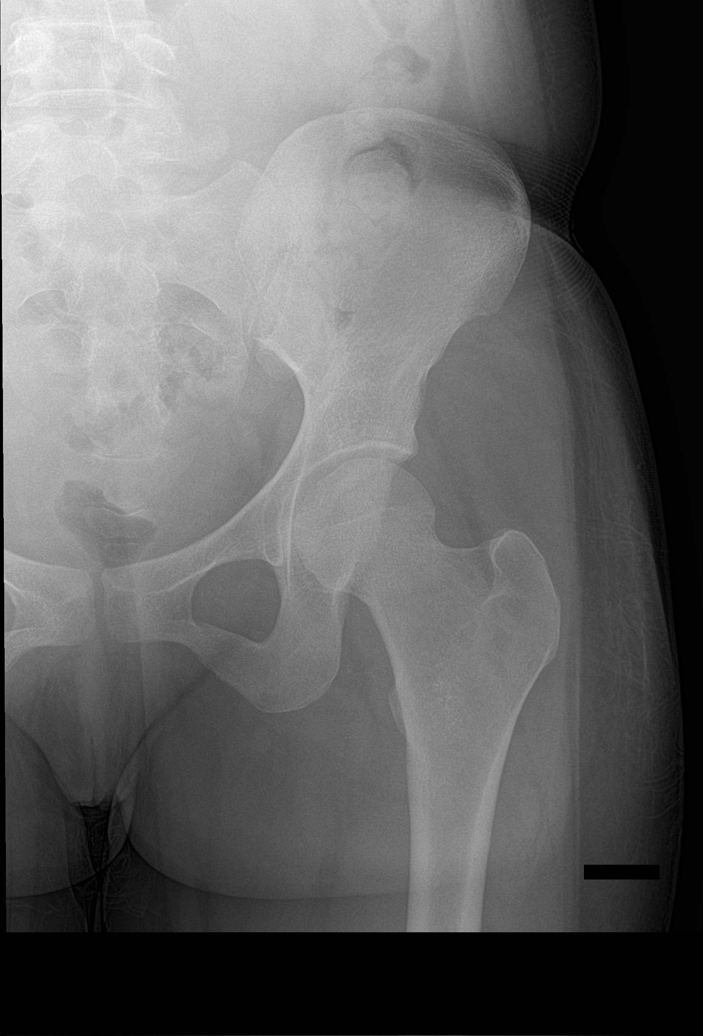

[hip lat (1 of 2)]
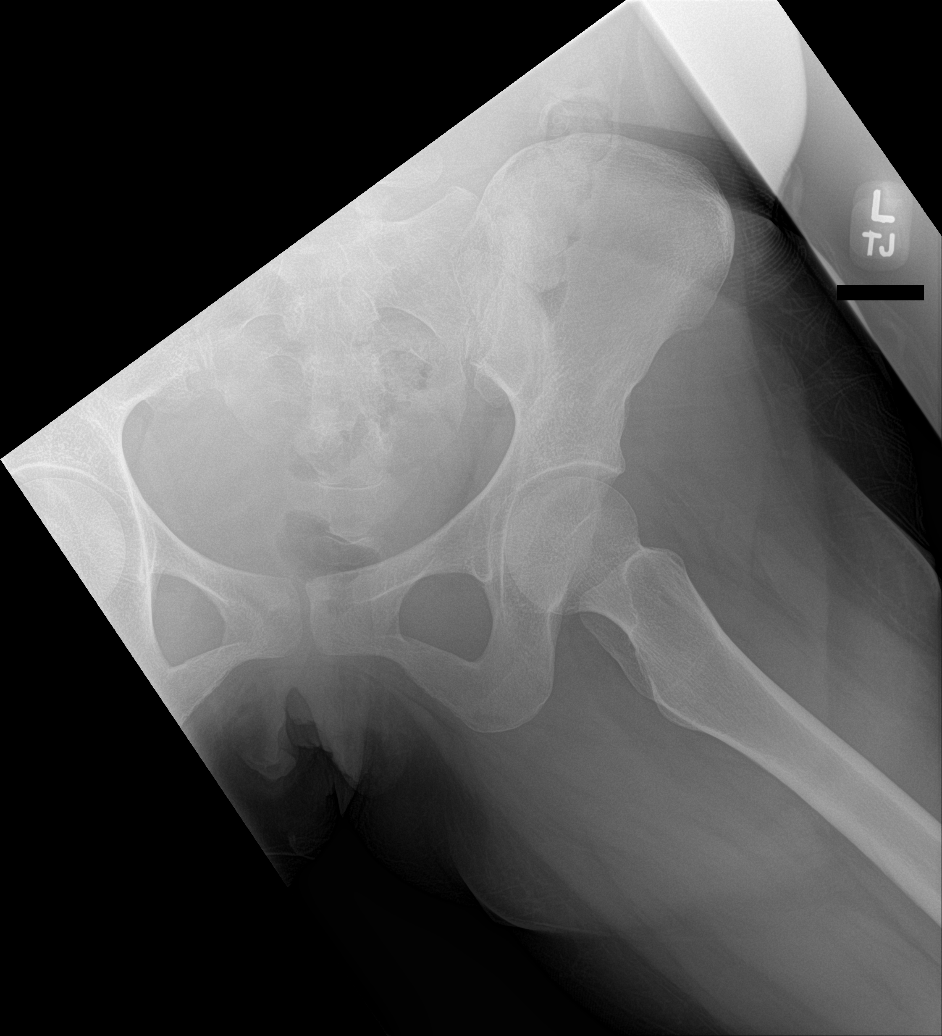

[hip ap (2 of 2)]
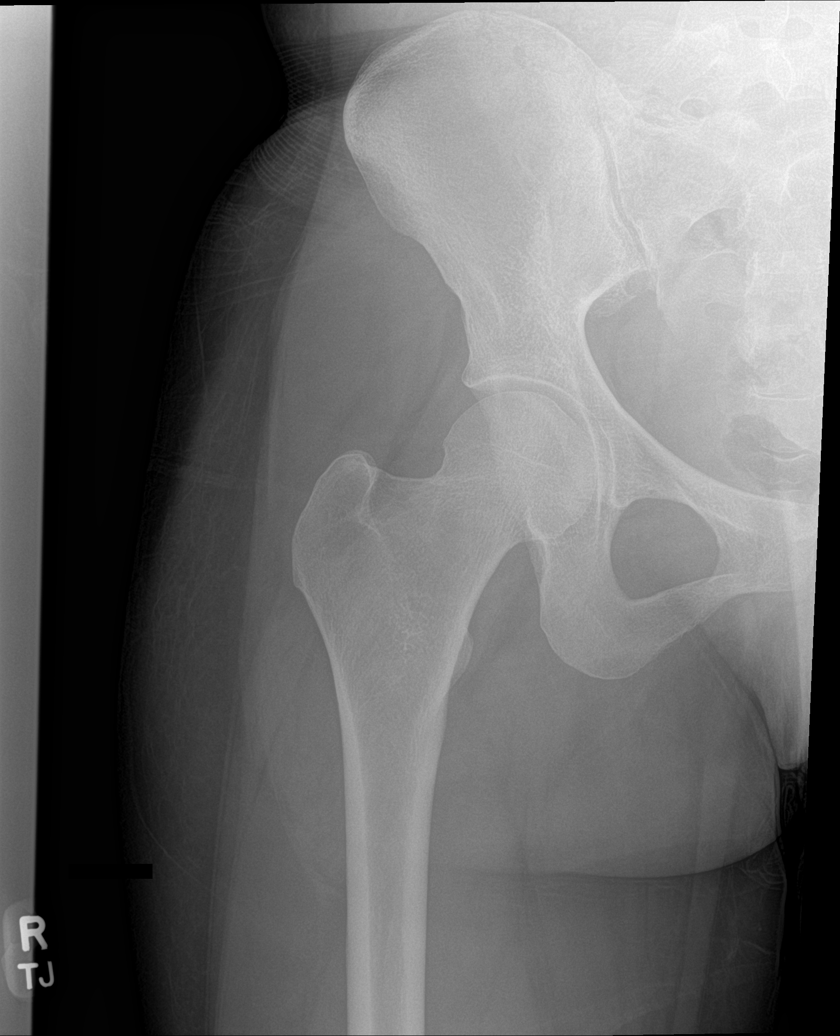

[hip lat (2 of 2)]
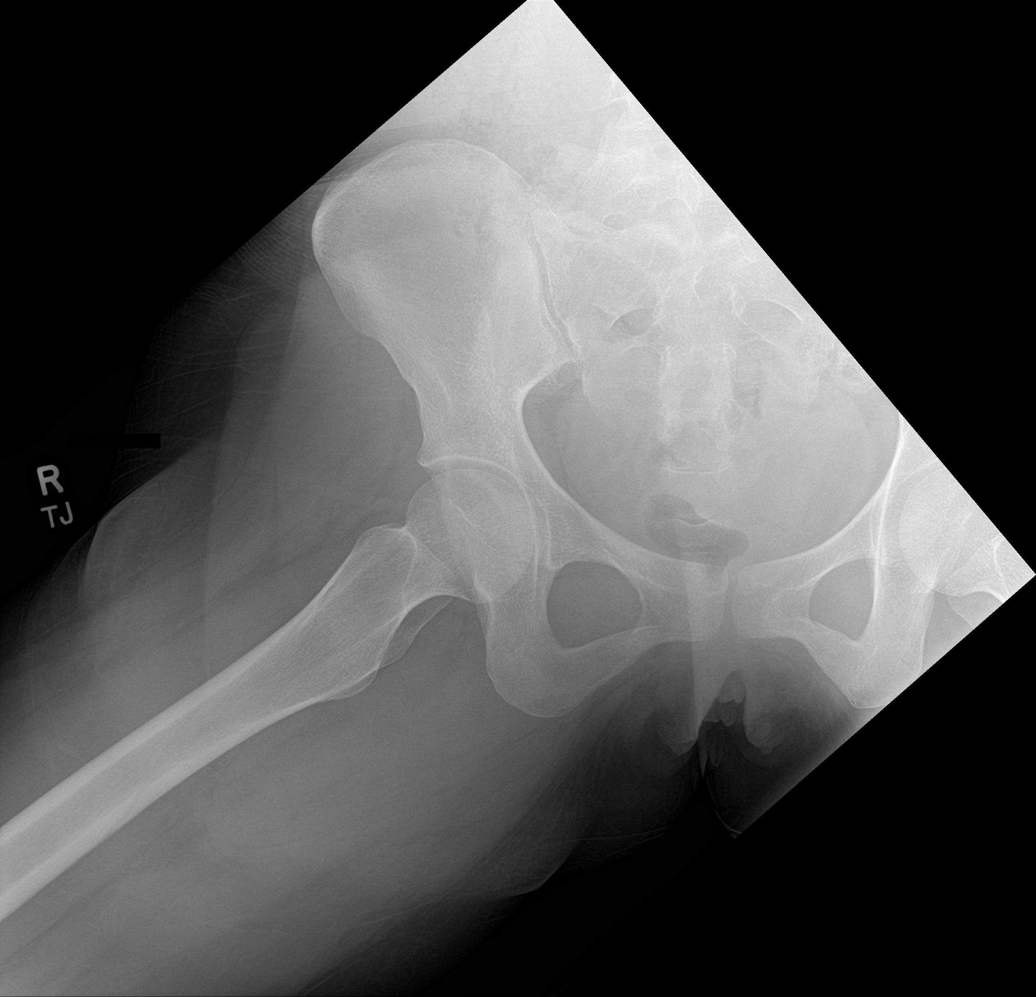

[5 of 5 positions shown; findings below may reference images not displayed]

FINDINGS: Femoral heads are located. Sacroiliac joints are symmetric. Joint
spaces maintained. No acute fracture or dislocation. No focal
osseous lesion.
IMPRESSION: No acute osseous abnormality.

## 2022-10-19 ENCOUNTER — Encounter: Payer: Medicaid Other | Admitting: Clinical

## 2022-10-22 ENCOUNTER — Telehealth (HOSPITAL_COMMUNITY): Payer: Self-pay | Admitting: *Deleted

## 2022-10-22 ENCOUNTER — Other Ambulatory Visit: Payer: Self-pay

## 2022-10-22 ENCOUNTER — Encounter (HOSPITAL_COMMUNITY): Payer: Self-pay

## 2022-10-22 ENCOUNTER — Emergency Department (HOSPITAL_COMMUNITY): Payer: Medicaid Other

## 2022-10-22 ENCOUNTER — Inpatient Hospital Stay (HOSPITAL_COMMUNITY)
Admission: EM | Admit: 2022-10-22 | Discharge: 2022-10-26 | DRG: 812 | Disposition: A | Discharge status: Home / Self Care | Payer: Medicaid Other | Attending: Internal Medicine | Admitting: Internal Medicine

## 2022-10-22 DIAGNOSIS — Z86711 Personal history of pulmonary embolism: Secondary | ICD-10-CM | POA: Insufficient documentation

## 2022-10-22 DIAGNOSIS — Z87440 Personal history of urinary (tract) infections: Secondary | ICD-10-CM

## 2022-10-22 DIAGNOSIS — G894 Chronic pain syndrome: Secondary | ICD-10-CM | POA: Diagnosis present

## 2022-10-22 DIAGNOSIS — N10 Acute tubulo-interstitial nephritis: Secondary | ICD-10-CM | POA: Diagnosis present

## 2022-10-22 DIAGNOSIS — I272 Pulmonary hypertension, unspecified: Secondary | ICD-10-CM | POA: Diagnosis present

## 2022-10-22 DIAGNOSIS — F3130 Bipolar disorder, current episode depressed, mild or moderate severity, unspecified: Secondary | ICD-10-CM | POA: Diagnosis present

## 2022-10-22 DIAGNOSIS — Z91040 Latex allergy status: Secondary | ICD-10-CM

## 2022-10-22 DIAGNOSIS — E871 Hypo-osmolality and hyponatremia: Secondary | ICD-10-CM | POA: Diagnosis present

## 2022-10-22 DIAGNOSIS — N12 Tubulo-interstitial nephritis, not specified as acute or chronic: Secondary | ICD-10-CM

## 2022-10-22 DIAGNOSIS — Z7901 Long term (current) use of anticoagulants: Secondary | ICD-10-CM

## 2022-10-22 DIAGNOSIS — Z91048 Other nonmedicinal substance allergy status: Secondary | ICD-10-CM

## 2022-10-22 DIAGNOSIS — A419 Sepsis, unspecified organism: Principal | ICD-10-CM

## 2022-10-22 DIAGNOSIS — D5700 Hb-SS disease with crisis, unspecified: Principal | ICD-10-CM | POA: Diagnosis present

## 2022-10-22 DIAGNOSIS — Z9081 Acquired absence of spleen: Secondary | ICD-10-CM

## 2022-10-22 DIAGNOSIS — B961 Klebsiella pneumoniae [K. pneumoniae] as the cause of diseases classified elsewhere: Secondary | ICD-10-CM | POA: Diagnosis present

## 2022-10-22 DIAGNOSIS — D72829 Elevated white blood cell count, unspecified: Secondary | ICD-10-CM | POA: Diagnosis present

## 2022-10-22 DIAGNOSIS — E876 Hypokalemia: Secondary | ICD-10-CM | POA: Diagnosis present

## 2022-10-22 DIAGNOSIS — Z79899 Other long term (current) drug therapy: Secondary | ICD-10-CM

## 2022-10-22 DIAGNOSIS — F319 Bipolar disorder, unspecified: Secondary | ICD-10-CM | POA: Diagnosis present

## 2022-10-22 LAB — CBC WITH DIFFERENTIAL/PLATELET
Abs Immature Granulocytes: 0.11 10*3/uL — ABNORMAL HIGH (ref 0.00–0.07)
Basophils Absolute: 0 10*3/uL (ref 0.0–0.1)
Basophils Relative: 0 %
Eosinophils Absolute: 0 10*3/uL (ref 0.0–0.5)
Eosinophils Relative: 0 %
HCT: 23.9 % — ABNORMAL LOW (ref 36.0–46.0)
Hemoglobin: 8.7 g/dL — ABNORMAL LOW (ref 12.0–15.0)
Immature Granulocytes: 1 %
Lymphocytes Relative: 10 %
Lymphs Abs: 2 10*3/uL (ref 0.7–4.0)
MCH: 41 pg — ABNORMAL HIGH (ref 26.0–34.0)
MCHC: 36.4 g/dL — ABNORMAL HIGH (ref 30.0–36.0)
MCV: 112.7 fL — ABNORMAL HIGH (ref 80.0–100.0)
Monocytes Absolute: 2.9 10*3/uL — ABNORMAL HIGH (ref 0.1–1.0)
Monocytes Relative: 15 %
Neutro Abs: 14 10*3/uL — ABNORMAL HIGH (ref 1.7–7.7)
Neutrophils Relative %: 74 %
Platelets: 487 10*3/uL — ABNORMAL HIGH (ref 150–400)
RBC: 2.12 MIL/uL — ABNORMAL LOW (ref 3.87–5.11)
RDW: 17.8 % — ABNORMAL HIGH (ref 11.5–15.5)
WBC: 19 10*3/uL — ABNORMAL HIGH (ref 4.0–10.5)
nRBC: 1.7 % — ABNORMAL HIGH (ref 0.0–0.2)

## 2022-10-22 LAB — COMPREHENSIVE METABOLIC PANEL
ALT: 37 U/L (ref 0–44)
AST: 39 U/L (ref 15–41)
Albumin: 4.8 g/dL (ref 3.5–5.0)
Alkaline Phosphatase: 103 U/L (ref 38–126)
Anion gap: 9 (ref 5–15)
BUN: 7 mg/dL (ref 6–20)
CO2: 23 mmol/L (ref 22–32)
Calcium: 8.9 mg/dL (ref 8.9–10.3)
Chloride: 99 mmol/L (ref 98–111)
Creatinine, Ser: 0.53 mg/dL (ref 0.44–1.00)
GFR, Estimated: >60 mL/min (ref >60)
Glucose, Bld: 107 mg/dL — ABNORMAL HIGH (ref 70–99)
Potassium: 3.6 mmol/L (ref 3.5–5.1)
Sodium: 131 mmol/L — ABNORMAL LOW (ref 135–145)
Total Bilirubin: 4.3 mg/dL — ABNORMAL HIGH (ref 0.3–1.2)
Total Protein: 7.5 g/dL (ref 6.5–8.1)

## 2022-10-22 LAB — URINALYSIS, ROUTINE W REFLEX MICROSCOPIC
Bilirubin Urine: NEGATIVE
Glucose, UA: NEGATIVE mg/dL
Ketones, ur: NEGATIVE mg/dL
Nitrite: NEGATIVE
Protein, ur: NEGATIVE mg/dL
Specific Gravity, Urine: 1.009 (ref 1.005–1.030)
pH: 8 (ref 5.0–8.0)

## 2022-10-22 LAB — RETICULOCYTES
Immature Retic Fract: 41.3 % — ABNORMAL HIGH (ref 2.3–15.9)
RBC.: 2.11 MIL/uL — ABNORMAL LOW (ref 3.87–5.11)
Retic Count, Absolute: 495.5 10*3/uL — ABNORMAL HIGH (ref 19.0–186.0)
Retic Ct Pct: 23.1 % — ABNORMAL HIGH (ref 0.4–3.1)

## 2022-10-22 LAB — PROTIME-INR
INR: 2.6 — ABNORMAL HIGH (ref 0.8–1.2)
Prothrombin Time: 27.7 seconds — ABNORMAL HIGH (ref 11.4–15.2)

## 2022-10-22 LAB — LIPASE, BLOOD: Lipase: 22 U/L (ref 11–51)

## 2022-10-22 LAB — I-STAT BETA HCG BLOOD, ED (MC, WL, AP ONLY): I-stat hCG, quantitative: <5 m[IU]/mL (ref <5)

## 2022-10-22 MED ORDER — ACETAMINOPHEN 325 MG PO TABS
ORAL_TABLET | ORAL | Status: AC
  Filled 2022-10-22: qty 2

## 2022-10-22 MED ORDER — HYDROMORPHONE 1 MG/ML IV SOLN
INTRAVENOUS | Status: DC
  Administered 2022-10-22: 3 mg via INTRAVENOUS
  Administered 2022-10-22: 30 mg via INTRAVENOUS
  Filled 2022-10-22: qty 30

## 2022-10-22 MED ORDER — SODIUM CHLORIDE 0.9 % IV SOLN
12.5000 mg | Freq: Once | INTRAVENOUS | Status: DC
  Filled 2022-10-22: qty 0.5

## 2022-10-22 MED ORDER — DIPHENHYDRAMINE HCL 25 MG PO CAPS
25.0000 mg | ORAL_CAPSULE | ORAL | Status: DC | PRN
  Administered 2022-10-23 – 2022-10-24 (×2): 25 mg via ORAL
  Filled 2022-10-22 (×3): qty 1

## 2022-10-22 MED ORDER — POLYETHYLENE GLYCOL 3350 17 G PO PACK: 17.0000 g | PACK | Freq: Every day | ORAL | Status: DC | PRN

## 2022-10-22 MED ORDER — HYDROMORPHONE HCL 2 MG/ML IJ SOLN
2.0000 mg | INTRAMUSCULAR | Status: AC
  Administered 2022-10-22: 2 mg via INTRAVENOUS
  Filled 2022-10-22: qty 1

## 2022-10-22 MED ORDER — QUETIAPINE FUMARATE 50 MG PO TABS
25.0000 mg | ORAL_TABLET | Freq: Every day | ORAL | Status: DC
  Administered 2022-10-22 – 2022-10-25 (×4): 25 mg via ORAL
  Filled 2022-10-22 (×4): qty 1

## 2022-10-22 MED ORDER — GABAPENTIN 300 MG PO CAPS
300.0000 mg | ORAL_CAPSULE | Freq: Three times a day (TID) | ORAL | Status: DC
  Administered 2022-10-22 – 2022-10-26 (×12): 300 mg via ORAL
  Filled 2022-10-22 (×12): qty 1

## 2022-10-22 MED ORDER — FOLIC ACID 1 MG PO TABS
1.0000 mg | ORAL_TABLET | Freq: Every day | ORAL | Status: DC
  Administered 2022-10-22 – 2022-10-26 (×5): 1 mg via ORAL
  Filled 2022-10-22 (×5): qty 1

## 2022-10-22 MED ORDER — ALBUTEROL SULFATE HFA 108 (90 BASE) MCG/ACT IN AERS: 3.0000 | INHALATION_SPRAY | Freq: Two times a day (BID) | RESPIRATORY_TRACT | Status: DC | PRN

## 2022-10-22 MED ORDER — VOXELOTOR 500 MG PO TABS
1000.0000 mg | ORAL_TABLET | Freq: Every day | ORAL | Status: DC
  Administered 2022-10-23 – 2022-10-26 (×4): 1000 mg via ORAL

## 2022-10-22 MED ORDER — ACETAMINOPHEN 325 MG PO TABS
650.0000 mg | ORAL_TABLET | ORAL | Status: DC | PRN
  Administered 2022-10-22 – 2022-10-25 (×3): 650 mg via ORAL
  Filled 2022-10-22 (×2): qty 2

## 2022-10-22 MED ORDER — SODIUM CHLORIDE 0.9 % IV SOLN
1.0000 g | Freq: Once | INTRAVENOUS | Status: AC
  Administered 2022-10-22: 1 g via INTRAVENOUS
  Filled 2022-10-22: qty 10

## 2022-10-22 MED ORDER — HYDROMORPHONE HCL 1 MG/ML IJ SOLN
1.0000 mg | Freq: Once | INTRAMUSCULAR | Status: AC
  Administered 2022-10-22: 1 mg via INTRAVENOUS
  Filled 2022-10-22: qty 1

## 2022-10-22 MED ORDER — OXYCODONE HCL ER 15 MG PO T12A
15.0000 mg | EXTENDED_RELEASE_TABLET | Freq: Two times a day (BID) | ORAL | Status: DC
  Administered 2022-10-22 – 2022-10-26 (×8): 15 mg via ORAL
  Filled 2022-10-22 (×8): qty 1

## 2022-10-22 MED ORDER — SODIUM CHLORIDE (PF) 0.9 % IJ SOLN
INTRAMUSCULAR | Status: AC
  Filled 2022-10-22: qty 50

## 2022-10-22 MED ORDER — KETOROLAC TROMETHAMINE 15 MG/ML IJ SOLN
15.0000 mg | INTRAMUSCULAR | Status: AC
  Administered 2022-10-22: 15 mg via INTRAVENOUS
  Filled 2022-10-22: qty 1

## 2022-10-22 MED ORDER — OXYCODONE HCL 5 MG PO TABS
15.0000 mg | ORAL_TABLET | Freq: Four times a day (QID) | ORAL | Status: DC | PRN
  Administered 2022-10-22 – 2022-10-23 (×2): 15 mg via ORAL
  Filled 2022-10-22 (×2): qty 3

## 2022-10-22 MED ORDER — RIVAROXABAN 20 MG PO TABS
20.0000 mg | ORAL_TABLET | Freq: Every day | ORAL | Status: DC
  Administered 2022-10-23 – 2022-10-25 (×3): 20 mg via ORAL
  Filled 2022-10-22 (×3): qty 1

## 2022-10-22 MED ORDER — HYDROXYUREA 500 MG PO CAPS
1500.0000 mg | ORAL_CAPSULE | Freq: Every day | ORAL | Status: DC
  Administered 2022-10-22 – 2022-10-26 (×5): 1500 mg via ORAL
  Filled 2022-10-22 (×5): qty 3

## 2022-10-22 MED ORDER — IOHEXOL 300 MG/ML  SOLN
100.0000 mL | Freq: Once | INTRAMUSCULAR | Status: AC | PRN
  Administered 2022-10-22: 100 mL via INTRAVENOUS

## 2022-10-22 MED ORDER — SODIUM CHLORIDE 0.9 % IV SOLN
1.0000 g | INTRAVENOUS | Status: DC
  Administered 2022-10-23 – 2022-10-26 (×4): 1 g via INTRAVENOUS
  Filled 2022-10-22 (×4): qty 10

## 2022-10-22 MED ORDER — ONDANSETRON HCL 4 MG/2ML IJ SOLN
4.0000 mg | Freq: Four times a day (QID) | INTRAMUSCULAR | Status: DC | PRN
  Administered 2022-10-22 – 2022-10-23 (×2): 4 mg via INTRAVENOUS
  Filled 2022-10-22 (×3): qty 2

## 2022-10-22 MED ORDER — ONDANSETRON HCL 4 MG/2ML IJ SOLN
4.0000 mg | INTRAMUSCULAR | Status: DC | PRN
  Administered 2022-10-22: 4 mg via INTRAVENOUS
  Filled 2022-10-22: qty 2

## 2022-10-22 MED ORDER — HYDROMORPHONE HCL 2 MG/ML IJ SOLN
2.0000 mg | INTRAMUSCULAR | Status: DC | PRN
  Administered 2022-10-22: 2 mg via INTRAVENOUS
  Filled 2022-10-22: qty 1

## 2022-10-22 MED ORDER — HYDROMORPHONE 1 MG/ML IV SOLN
INTRAVENOUS | Status: DC
  Administered 2022-10-23: 0.5 mg via INTRAVENOUS
  Administered 2022-10-23 (×2): 7 mg via INTRAVENOUS
  Administered 2022-10-23: 6 mg via INTRAVENOUS
  Administered 2022-10-23: 6.5 mg via INTRAVENOUS
  Administered 2022-10-23 (×2): 3.5 mg via INTRAVENOUS
  Administered 2022-10-24: 10.5 mg via INTRAVENOUS
  Administered 2022-10-24: 6 mg via INTRAVENOUS
  Administered 2022-10-24: 30 mg via INTRAVENOUS
  Administered 2022-10-24: 7 mg via INTRAVENOUS
  Administered 2022-10-24: 3 mg via INTRAVENOUS
  Administered 2022-10-24: 4.5 mg via INTRAVENOUS
  Administered 2022-10-24: 8 mg via INTRAVENOUS
  Administered 2022-10-25: 30 mg via INTRAVENOUS
  Administered 2022-10-25: 5 mg via INTRAVENOUS
  Administered 2022-10-25: 8.5 mg via INTRAVENOUS
  Administered 2022-10-25: 2 mg via INTRAVENOUS
  Administered 2022-10-25: 6.5 mg via INTRAVENOUS
  Administered 2022-10-25: 8 mg via INTRAVENOUS
  Administered 2022-10-26: 5 mg via INTRAVENOUS
  Administered 2022-10-26: 6 mg via INTRAVENOUS
  Administered 2022-10-26: 30 mg via INTRAVENOUS
  Administered 2022-10-26: 3.5 mg via INTRAVENOUS
  Filled 2022-10-22 (×4): qty 30

## 2022-10-22 MED ORDER — SODIUM CHLORIDE 0.9% FLUSH: 9.0000 mL | INTRAVENOUS | Status: DC | PRN

## 2022-10-22 MED ORDER — NALOXONE HCL 0.4 MG/ML IJ SOLN: 0.4000 mg | INTRAMUSCULAR | Status: DC | PRN

## 2022-10-22 MED ORDER — SENNOSIDES-DOCUSATE SODIUM 8.6-50 MG PO TABS
1.0000 | ORAL_TABLET | Freq: Two times a day (BID) | ORAL | Status: DC
  Administered 2022-10-23 – 2022-10-26 (×7): 1 via ORAL
  Filled 2022-10-22 (×8): qty 1

## 2022-10-22 MED ORDER — KETOROLAC TROMETHAMINE 15 MG/ML IJ SOLN
15.0000 mg | Freq: Four times a day (QID) | INTRAMUSCULAR | Status: DC
  Administered 2022-10-22 – 2022-10-26 (×16): 15 mg via INTRAVENOUS
  Filled 2022-10-22 (×16): qty 1

## 2022-10-22 NOTE — ED Notes (Signed)
ED TO INPATIENT HANDOFF REPORT  ED Nurse Name and Phone #: [phone] Tana [place] Name/Age/Gender Sandra Mckay 27 y.o. female Room/Bed: WA03/WA03  Code Status   Code Status: Full Code  Home/SNF/Other Home Patient oriented to: self, place, time, and situation Is this baseline? Yes   Triage Complete: Triage complete  Chief Complaint Sickle cell pain crisis ([place]) [D57.00]  Triage Note Reports Flu like symptoms, fever, flank pain, dysuria, and has been sickle cell crisis pain all over x2 days. States she has been taking prescribe medications for pain w/ no relief. Also states she was on a course of antibiotics recently. Denies any other symptoms.    Allergies Allergies  Allergen Reactions   Latex Rash   Wound Dressing Adhesive Rash    Level of Care/Admitting Diagnosis ED Disposition     ED Disposition  Admit   Condition  --   Comment  Hospital Area: [place] [[id number]]  Level of Care: Med-Surg [16]  May place patient in observation at [facility] or [place] if equivalent level of care is available:: No  Covid Evaluation: Asymptomatic - no recent exposure (last 10 days) testing not required  Diagnosis: Sickle cell pain crisis [facility]) [id number]  Admitting Physician: Dorena Dew [id number]  Attending Physician: Tresa Garter [id number]          B Medical/Surgery History Past Medical History:  Diagnosis Date   Acute cystitis without hematuria 10/21/2019   Blood transfusion without reported diagnosis    Bronchitis    Chickenpox    Depression    Elevated ferritin level 05/2019   Heart murmur    Nausea without vomiting 09/09/2015   Pulmonary hypertension (HCC)    Sickle cell anemia (HCC)    Sickle cell disease, type SS ([place])    Sickle cell pain crisis ([place]) 12/05/2016   Thrombocytosis 05/2019   Urinary tract infection    Vitamin D deficiency    Past Surgical History:  Procedure Laterality Date   CHOLECYSTECTOMY  2011   EYE  SURGERY     Sty removal   IR IMAGING GUIDED PORT INSERTION  03/06/2022   IR REMOVAL TUN ACCESS W/ PORT W/O FL MOD SED  05/29/2022   LABIAL ADHESION LYSIS  1999   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[place] 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   TONSILLECTOMY  2012     A IV Location/Drains/Wounds Patient Lines/Drains/Airways Status     Active Line/Drains/Airways     Name Placement date Placement time Site Days   Peripheral IV 10/22/22 20 G Left Antecubital 10/22/22  1125  Antecubital  less than 1            Intake/Output Last 24 hours No intake or output data in the 24 hours ending 10/22/22 1456  Labs/Imaging Results for orders placed or performed during the hospital encounter of 10/22/22 (from the past 48 hour(s))  Comprehensive metabolic panel     Status: Abnormal   Collection Time: 10/22/22 11:28 AM  Result Value Ref Range   Sodium 131 (L) 135 - 145 mmol/L   Potassium 3.6 3.5 - 5.1 mmol/L   Chloride 99 98 - 111 mmol/L   CO2 23 22 - 32 mmol/L   Glucose, Bld 107 (H) 70 - 99 mg/dL    Comment: Glucose reference range applies only to samples taken after fasting for at least 8 hours.   BUN 7 6 - 20 mg/dL   Creatinine, Ser 0.53 0.44 - 1.00 mg/dL  Calcium 8.9 8.9 - 10.3 mg/dL   Total Protein 7.5 6.5 - 8.1 g/dL   Albumin 4.8 3.5 - 5.0 g/dL   AST 39 15 - 41 U/L   ALT 37 0 - 44 U/L   Alkaline Phosphatase 103 38 - 126 U/L   Total Bilirubin 4.3 (H) 0.3 - 1.2 mg/dL   GFR, Estimated >60 >60 mL/min    Comment: (NOTE) Calculated using the CKD-EPI Creatinine Equation (2021)    Anion gap 9 5 - 15    Comment: Performed at [facility], [place] [address] [place], [place] [address]  Lipase, blood     Status: None   Collection Time: 10/22/22 11:28 AM  Result Value Ref Range   Lipase 22 11 - 51 U/L    Comment: Performed at [facility], [place] [address] [place], [place] [address]  CBC with Diff     Status: Abnormal   Collection Time: 10/22/22 11:28 AM   Result Value Ref Range   WBC 19.0 (H) 4.0 - 10.5 K/uL   RBC 2.12 (L) 3.87 - 5.11 MIL/uL   Hemoglobin 8.7 (L) 12.0 - 15.0 g/dL   HCT 23.9 (L) 36.0 - 46.0 %   MCV 112.7 (H) 80.0 - 100.0 fL   MCH 41.0 (H) 26.0 - 34.0 pg   MCHC 36.4 (H) 30.0 - 36.0 g/dL   RDW 17.8 (H) 11.5 - 15.5 %   Platelets 487 (H) 150 - 400 K/uL   nRBC 1.7 (H) 0.0 - 0.2 %   Neutrophils Relative % 74 %   Neutro Abs 14.0 (H) 1.7 - 7.7 K/uL   Lymphocytes Relative 10 %   Lymphs Abs 2.0 0.7 - 4.0 K/uL   Monocytes Relative 15 %   Monocytes Absolute 2.9 (H) 0.1 - 1.0 K/uL   Eosinophils Relative 0 %   Eosinophils Absolute 0.0 0.0 - 0.5 K/uL   Basophils Relative 0 %   Basophils Absolute 0.0 0.0 - 0.1 K/uL   Immature Granulocytes 1 %   Abs Immature Granulocytes 0.11 (H) 0.00 - 0.07 K/uL   Polychromasia PRESENT    Sickle Cells PRESENT    Target Cells PRESENT     Comment: Performed at [facility], [place] [address] [place], [place] [address]  Reticulocytes     Status: Abnormal   Collection Time: 10/22/22 11:28 AM  Result Value Ref Range   Retic Ct Pct 23.1 (H) 0.4 - 3.1 %    Comment: Results Confirmed by Manual Dilution   RBC. 2.11 (L) 3.87 - 5.11 MIL/uL   Retic Count, Absolute 495.5 (H) 19.0 - 186.0 K/uL   Immature Retic Fract 41.3 (H) 2.3 - 15.9 %    Comment: Performed at [facility], [place] [address] [place], [place] [address]  Protime-INR     Status: Abnormal   Collection Time: 10/22/22 11:28 AM  Result Value Ref Range   Prothrombin Time 27.7 (H) 11.4 - 15.2 seconds   INR 2.6 (H) 0.8 - 1.2    Comment: (NOTE) INR goal varies based on device and disease states. Performed at [facility], [place] [address] [place], [place] [address]   I-Stat beta hCG blood, ED     Status: None   Collection Time: 10/22/22 11:30 AM  Result Value Ref Range   I-stat hCG, quantitative <5.0 <5 mIU/mL   Comment 3            Comment:   GEST. AGE  CONC.  (mIU/mL)   <=1 WEEK         5 - 50     2 WEEKS       50 - 500     3 WEEKS       100 - 10,000     4 WEEKS     1,000 - 30,000        FEMALE AND NON-PREGNANT FEMALE:     LESS THAN 5 mIU/mL   Urinalysis, Routine w reflex microscopic -Urine, Clean Catch     Status: Abnormal   Collection Time: 10/22/22 12:35 PM  Result Value Ref Range   Color, Urine YELLOW YELLOW   APPearance HAZY (A) CLEAR   Specific Gravity, Urine 1.009 1.005 - 1.030   pH 8.0 5.0 - 8.0   Glucose, UA NEGATIVE NEGATIVE mg/dL   Hgb urine dipstick SMALL (A) NEGATIVE   Bilirubin Urine NEGATIVE NEGATIVE   Ketones, ur NEGATIVE NEGATIVE mg/dL   Protein, ur NEGATIVE NEGATIVE mg/dL   Nitrite NEGATIVE NEGATIVE   Leukocytes,Ua TRACE (A) NEGATIVE   RBC / HPF 0-5 0 - 5 RBC/hpf   WBC, UA 21-50 0 - 5 WBC/hpf   Bacteria, UA RARE (A) NONE SEEN   Squamous Epithelial / HPF 0-5 0 - 5 /HPF    Comment: Performed at [facility], [place] [address] [place], [place] [address]   CT ABDOMEN PELVIS W CONTRAST  Result Date: 10/22/2022 CLINICAL DATA:  Flu-like symptoms, fever, flank pain, dysuria 2 days of sickle cell pain crisis. EXAM: CT ABDOMEN AND PELVIS WITH CONTRAST TECHNIQUE: Multidetector CT imaging of the abdomen and pelvis was performed using the standard protocol following bolus administration of intravenous contrast. RADIATION DOSE REDUCTION: This exam was performed according to the departmental dose-optimization program which includes automated exposure control, adjustment of the mA and/or kV according to patient size and/or use of iterative reconstruction technique. CONTRAST:  142mL OMNIPAQUE IOHEXOL 300 MG/ML  SOLN COMPARISON:  Chest CT May 26, 2022. FINDINGS: Lower chest: Scarring/atelectasis in the bilateral lung bases. Hepatobiliary: No suspicious hepatic lesion. Gallbladder surgically absent. Prominence of the biliary tree with the common duct measuring 16 mm and gentle tapering of the duct to the level of the ampulla. Pancreas: No  pancreatic ductal dilation or evidence of acute inflammation. 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 Adrenals/Urinary Tract: Bilateral adrenal glands appear normal. No hydronephrosis. Questionable subtle hypoenhancement of the right kidney. Diffuse urinary bladder wall thickening with urothelial hyperenhancement. Stomach/Bowel: Stomach is unremarkable for degree of distension. No pathologic dilation of small or large bowel. No evidence of acute bowel inflammation. Vascular/Lymphatic: Normal caliber abdominal aorta. Smooth IVC contours. No pathologically enlarged abdominal or pelvic lymph nodes. Reproductive: Dominant follicle in the right ovary measuring 2.2 cm is considered benign/physiologic and requires no independent imaging follow-up. Fluid in the endometrial canal commonly benign related to patient's menstrual cycle. Other: Trace pelvic free fluid is within physiologic normal limits. Musculoskeletal: No acute osseous abnormality. IMPRESSION: 1. Diffuse urinary bladder wall thickening with urothelial hyperenhancement, suggestive of cystitis. 2. Questionable subtle hypoenhancement of the right kidney, which may reflect early pyelonephritis. Suggest correlation with laboratory values and CVA tenderness to palpation. 3. Prominence of the biliary tree with the common duct measuring 16 mm and gentle tapering of the duct to the level of the ampulla, likely reflecting post cholecystectomy state. Correlate with laboratory values to exclude biliary obstruction. Electronically Signed   By: Dahlia Bailiff M.D.   On: 10/22/2022 13:08    Pending Labs Unresulted  Labs (From admission, onward)     Start     Ordered   Signed and Occupational hygienist morning,   R        Signed and Held   Signed and Held  CBC  Tomorrow morning,   R        Signed and Held   Signed and Held  Urine Culture (for pregnant, neutropenic or urologic patients or patients with an indwelling urinary catheter)  (Urine Culture)  Once,   R        Question:  Indication  Answer:  Sepsis   Signed and Held            Vitals/Pain Today's Vitals   10/22/22 1230 10/22/22 1320 10/22/22 1330 10/22/22 1400  BP: 112/61 108/60 (!) 103/55 (!) 108/55  Pulse: 90 89 92 87  Resp: 20 17 (!) 25 (!) 21  Temp:    99 F (37.2 C)  TempSrc:      SpO2: 93% 96% 94% 93%  Weight:      Height:      PainSc:        Isolation Precautions No active isolations  Medications Medications  ondansetron (ZOFRAN) injection 4 mg (4 mg Intravenous Given 10/22/22 1126)  HYDROmorphone (DILAUDID) injection 1 mg (1 mg Intravenous Given 10/22/22 1126)  ketorolac (TORADOL) 15 MG/ML injection 15 mg (15 mg Intravenous Given 10/22/22 1126)  HYDROmorphone (DILAUDID) injection 2 mg (2 mg Intravenous Given 10/22/22 1207)  HYDROmorphone (DILAUDID) injection 2 mg (2 mg Intravenous Given 10/22/22 1233)  iohexol (OMNIPAQUE) 300 MG/ML solution 100 mL (100 mLs Intravenous Contrast Given 10/22/22 1243)  sodium chloride (PF) 0.9 % injection (  Given 10/22/22 1249)  cefTRIAXone (ROCEPHIN) 1 g in sodium chloride 0.9 % 100 mL IVPB (0 g Intravenous Stopped 10/22/22 1359)    Mobility walks     Focused Assessments SSC   R Recommendations: See Admitting Provider Note  Report given to:   Additional Notes: .

## 2022-10-22 NOTE — Telephone Encounter (Signed)
Patient called requesting to come to the day hospital for sickle cell pain. Patient reports back pain rated 10/10. Patient believe that she has been having frequent UTI's which are contributing to her back pain. Reports taking Oxycodone 15 mg last night. Patient reports that she thinks she has a fever because she has been shivering. Reports chest pain, vomiting and abdominal pain. [place], [place] notified and advised patient to go to the ER for work-up due to her possible fever, chest pain and suspected UTI. [place], [place] can meet patient at the ER if patient goes to [place]. Patient advised and expresses an understanding.

## 2022-10-22 NOTE — ED Provider Notes (Signed)
[facility] Provider Note   CSN: [id number] Arrival date & time: 10/22/22  1020     History  Chief Complaint  Patient presents with   Fever   Sickle Cell Pain Crisis   Vomiting   Flank Pain   Dysuria    Sandra Mckay is a 27 y.o. female.  HPI Patient presents with mother, daughter.  Mother helps with the history. Patient has history of sickle cell disease, frequent urinary tract infection.  She presents with concern for fever, worsening back and abdominal pain.  No relief with home narcotic use.  No chest pain though she does have dyspnea. Onset was about 2 days ago, and in spite of taking her medications, and OTC meds due to progression she presents for evaluation.    Home Medications Prior to Admission medications   Medication Sig Start Date End Date Taking? Authorizing Provider  albuterol (VENTOLIN HFA) 108 (90 Base) MCG/ACT inhaler Inhale 3-4 puffs into the lungs 2 (two) times daily as needed for wheezing or shortness of breath. Patient not taking: Reported on 06/02/2022    [provider]  folic acid (FOLVITE) 1 MG tablet Take 1 tablet (1 mg total) by mouth daily. 04/20/22   Tresa Garter, MD  gabapentin (NEURONTIN) 100 MG capsule Take 3 capsules (300 mg total) by mouth 3 (three) times daily. Patient taking differently: Take 100 mg by mouth in the morning, at noon, and at bedtime. 02/26/22   Dorena Dew, FNP  hydroxyurea (HYDREA) 500 MG capsule Take 3 capsules (1,500 mg total) by mouth daily. TAKE 3 CAPSULES (1,500 MG TOTAL) BY MOUTH DAILY. Patient taking differently: Take 1,500 mg by mouth daily. 04/20/22   Tresa Garter, MD  oxyCODONE (ROXICODONE) 15 MG immediate release tablet Take 1 tablet (15 mg total) by mouth every 6 (six) hours as needed for up to 15 days for pain. 10/17/22 11/01/22  Fenton Foy, NP  oxyCODONE ER [facility] ER) 13.5 MG C12A Take 13.5 mg by mouth every 12 (twelve) hours.  10/02/22   Dorena Dew, FNP  QUEtiapine (SEROQUEL) 25 MG tablet TAKE 1 TABLET BY MOUTH EVERYDAY AT BEDTIME 06/03/22   Dorena Dew, FNP  rivaroxaban (XARELTO) 20 MG TABS tablet Take 1 tablet (20 mg total) by mouth daily with supper. 05/31/22   Tresa Garter, MD  voxelotor (OXBRYTA) 500 MG TABS tablet Take 1,000 mg by mouth daily. 09/14/22   Dorena Dew, FNP      Allergies    Latex and Wound dressing adhesive    Review of Systems   Review of Systems  All other systems reviewed and are negative.   Physical Exam Updated Vital Signs BP (!) 108/55   Pulse 87   Temp 99 F (37.2 C)   Resp (!) 21   Ht 5\' 2"  (1.575 m)   Wt 58.2 kg   LMP 10/15/2022 (Approximate)   SpO2 93%   BMI 23.47 kg/m  Physical Exam Vitals and nursing note reviewed.  Constitutional:      Appearance: She is well-developed. She is ill-appearing.  HENT:     Head: Normocephalic and atraumatic.  Eyes:     Conjunctiva/sclera: Conjunctivae normal.  Cardiovascular:     Rate and Rhythm: Regular rhythm. Tachycardia present.  Pulmonary:     Effort: Pulmonary effort is normal. No respiratory distress.     Breath sounds: Normal breath sounds. No stridor.  Abdominal:     General: There is  no distension.     Tenderness: There is abdominal tenderness. There is guarding.  Skin:    General: Skin is warm and dry.  Neurological:     Mental Status: She is alert and oriented to person, place, and time.     Cranial Nerves: No cranial nerve deficit.  Psychiatric:        Mood and Affect: Mood normal.     ED Results / Procedures / Treatments   Labs (all labs ordered are listed, but only abnormal results are displayed) Labs Reviewed  COMPREHENSIVE METABOLIC PANEL - Abnormal; Notable for the following components:      Result Value   Sodium 131 (*)    Glucose, Bld 107 (*)    Total Bilirubin 4.3 (*)    All other components within normal limits  CBC WITH DIFFERENTIAL/PLATELET - Abnormal; Notable for the  following components:   WBC 19.0 (*)    RBC 2.12 (*)    Hemoglobin 8.7 (*)    HCT 23.9 (*)    MCV 112.7 (*)    MCH 41.0 (*)    MCHC 36.4 (*)    RDW 17.8 (*)    Platelets 487 (*)    nRBC 1.7 (*)    Neutro Abs 14.0 (*)    Monocytes Absolute 2.9 (*)    Abs Immature Granulocytes 0.11 (*)    All other components within normal limits  URINALYSIS, ROUTINE W REFLEX MICROSCOPIC - Abnormal; Notable for the following components:   APPearance HAZY (*)    Hgb urine dipstick SMALL (*)    Leukocytes,Ua TRACE (*)    Bacteria, UA RARE (*)    All other components within normal limits  RETICULOCYTES - Abnormal; Notable for the following components:   Retic Ct Pct 23.1 (*)    RBC. 2.11 (*)    Retic Count, Absolute 495.5 (*)    Immature Retic Fract 41.3 (*)    All other components within normal limits  PROTIME-INR - Abnormal; Notable for the following components:   Prothrombin Time 27.7 (*)    INR 2.6 (*)    All other components within normal limits  LIPASE, BLOOD  I-STAT BETA HCG BLOOD, ED ([facility], [facility], [facility] ONLY)    EKG EKG Interpretation  Date/Time:  Thursday October 22 2022 10:30:33 EDT Ventricular Rate:  94 PR Interval:  130 QRS Duration: 91 QT Interval:  343 QTC Calculation: 429 R Axis:   76 Text Interpretation: Sinus rhythm Ventricular premature complex T wave abnormality Abnormal ECG Confirmed by Carmin Muskrat [id number]) on 10/22/2022 10:47:49 AM  Radiology CT ABDOMEN PELVIS W CONTRAST  Result Date: 10/22/2022 CLINICAL DATA:  Flu-like symptoms, fever, flank pain, dysuria 2 days of sickle cell pain crisis. EXAM: CT ABDOMEN AND PELVIS WITH CONTRAST TECHNIQUE: Multidetector CT imaging of the abdomen and pelvis was performed using the standard protocol following bolus administration of intravenous contrast. RADIATION DOSE REDUCTION: This exam was performed according to the departmental dose-optimization program which includes automated exposure control, adjustment of the mA and/or kV according  to patient size and/or use of iterative reconstruction technique. CONTRAST:  169mL OMNIPAQUE IOHEXOL 300 MG/ML  SOLN COMPARISON:  Chest CT May 26, 2022. FINDINGS: Lower chest: Scarring/atelectasis in the bilateral lung bases. Hepatobiliary: No suspicious hepatic lesion. Gallbladder surgically absent. Prominence of the biliary tree with the common duct measuring 16 mm and gentle tapering of the duct to the level of the ampulla. Pancreas: No pancreatic ductal dilation or evidence of acute inflammation. 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 Adrenals/Urinary  Tract: Bilateral adrenal glands appear normal. No hydronephrosis. Questionable subtle hypoenhancement of the right kidney. Diffuse urinary bladder wall thickening with urothelial hyperenhancement. Stomach/Bowel: Stomach is unremarkable for degree of distension. No pathologic dilation of small or large bowel. No evidence of acute bowel inflammation. Vascular/Lymphatic: Normal caliber abdominal aorta. Smooth IVC contours. No pathologically enlarged abdominal or pelvic lymph nodes. Reproductive: Dominant follicle in the right ovary measuring 2.2 cm is considered benign/physiologic and requires no independent imaging follow-up. Fluid in the endometrial canal commonly benign related to patient's menstrual cycle. Other: Trace pelvic free fluid is within physiologic normal limits. Musculoskeletal: No acute osseous abnormality. IMPRESSION: 1. Diffuse urinary bladder wall thickening with urothelial hyperenhancement, suggestive of cystitis. 2. Questionable subtle hypoenhancement of the right kidney, which may reflect early pyelonephritis. Suggest correlation with laboratory values and CVA tenderness to palpation. 3. Prominence of the biliary tree with the common duct measuring 16 mm and gentle tapering of the duct to the level of the ampulla, likely reflecting post cholecystectomy state. Correlate with laboratory values to exclude biliary obstruction. Electronically Signed   By:  Dahlia Bailiff M.D.   On: 10/22/2022 13:08    Procedures Procedures    Medications Ordered in ED Medications  ondansetron (ZOFRAN) injection 4 mg (4 mg Intravenous Given 10/22/22 1126)  HYDROmorphone (DILAUDID) injection 1 mg (1 mg Intravenous Given 10/22/22 1126)  ketorolac (TORADOL) 15 MG/ML injection 15 mg (15 mg Intravenous Given 10/22/22 1126)  HYDROmorphone (DILAUDID) injection 2 mg (2 mg Intravenous Given 10/22/22 1207)  HYDROmorphone (DILAUDID) injection 2 mg (2 mg Intravenous Given 10/22/22 1233)  iohexol (OMNIPAQUE) 300 MG/ML solution 100 mL (100 mLs Intravenous Contrast Given 10/22/22 1243)  sodium chloride (PF) 0.9 % injection (  Given 10/22/22 1249)  cefTRIAXone (ROCEPHIN) 1 g in sodium chloride 0.9 % 100 mL IVPB (0 g Intravenous Stopped 10/22/22 1359)    ED Course/ Medical Decision Making/ A&P                             Medical Decision Making Adult female with a history of sickle cell disease, urinary tract infections presents with 2 days of abdominal pain, back pain, fever, lack of improvement with home narcotics and OTC medication. Patient is awake, alert, not hypotensive, but is tachycardic and febrile, and concern for infection versus SIRS is considered.  X-ray, labs, CT monitoring started. Cardiac 105 sinus tach abnormal Pulse ox 99% room air normal Patient received IV Dilaudid, fluids, Zofran.  Amount and/or Complexity of Data Reviewed Independent Historian: parent External Data Reviewed: notes.    Details: Sickle cell care center notes from today reviewed, patient encouraged to come here for evaluation. Labs: ordered. Decision-making details documented in ED Course. Radiology: ordered and independent interpretation performed. Decision-making details documented in ED Course. ECG/medicine tests: ordered and independent interpretation performed. Decision-making details documented in ED Course.  Risk Prescription drug management. Decision regarding  hospitalization.   2:23 PM Patient marginally better, sitting upright, continues to complain of pain.  I reviewed her labs, CT, urinalysis.  Findings consistent with pyelonephritis.  She has received ceftriaxone IV, multiple doses of pain medication, and fluid resuscitation.  Patient meets sepsis criteria, though absent hypotension no evidence for sepsis. Patient will require admission for further monitoring, management.         Final Clinical Impression(s) / ED Diagnoses Final diagnoses:  Sepsis without acute organ dysfunction, due to unspecified organism [facility])  Pyelonephritis   CRITICAL CARE Performed by:  Carmin Muskrat Total critical care time: 40 minutes Critical care time was exclusive of separately billable procedures and treating other patients. Critical care was necessary to treat or prevent imminent or life-threatening deterioration. Critical care was time spent personally by me on the following activities: development of treatment plan with patient and/or surrogate as well as nursing, discussions with consultants, evaluation of patient's response to treatment, examination of patient, obtaining history from patient or surrogate, ordering and performing treatments and interventions, ordering and review of laboratory studies, ordering and review of radiographic studies, pulse oximetry and re-evaluation of patient's condition.    Carmin Muskrat, MD 10/22/22 1426

## 2022-10-22 NOTE — Plan of Care (Signed)
Patient alert and oriented X4, admitted for 10/10 sickle cell pain crisis, PCA set up and medication administered successfully, all admission questions answered, patient oriented to staff and room, R PIV intact, zofran administered for nausea and vomiting. No further concerns at this time.  Problem: Education: Goal: Knowledge of vaso-occlusive preventative measures will improve Outcome: Progressing Goal: Awareness of infection prevention will improve Outcome: Progressing Goal: Awareness of signs and symptoms of anemia will improve Outcome: Progressing Goal: Long-term complications will improve Outcome: Progressing   Problem: Self-Care: Goal: Ability to incorporate actions that prevent/reduce pain crisis will improve Outcome: Progressing   Problem: Bowel/Gastric: Goal: Gut motility will be maintained Outcome: Progressing   Problem: Tissue Perfusion: Goal: Complications related to inadequate tissue perfusion will be avoided or minimized Outcome: Progressing   Problem: Respiratory: Goal: Pulmonary complications will be avoided or minimized Outcome: Progressing Goal: Acute Chest Syndrome will be identified early to prevent complications Outcome: Progressing   Problem: Fluid Volume: Goal: Ability to maintain a balanced intake and output will improve Outcome: Progressing   Problem: Sensory: Goal: Pain level will decrease with appropriate interventions Outcome: Progressing   Problem: Health Behavior: Goal: Postive changes in compliance with treatment and prescription regimens will improve Outcome: Progressing   Problem: Education: Goal: Knowledge of General Education information will improve Description: Including pain rating scale, medication(s)/side effects and non-pharmacologic comfort measures Outcome: Progressing   Problem: Health Behavior/Discharge Planning: Goal: Ability to manage health-related needs will improve Outcome: Progressing   Problem: Clinical  Measurements: Goal: Ability to maintain clinical measurements within normal limits will improve Outcome: Progressing Goal: Will remain free from infection Outcome: Progressing Goal: Diagnostic test results will improve Outcome: Progressing Goal: Respiratory complications will improve Outcome: Progressing Goal: Cardiovascular complication will be avoided Outcome: Progressing   Problem: Activity: Goal: Risk for activity intolerance will decrease Outcome: Progressing   Problem: Nutrition: Goal: Adequate nutrition will be maintained Outcome: Progressing   Problem: Coping: Goal: Level of anxiety will decrease Outcome: Progressing   Problem: Elimination: Goal: Will not experience complications related to bowel motility Outcome: Progressing Goal: Will not experience complications related to urinary retention Outcome: Progressing   Problem: Pain Managment: Goal: General experience of comfort will improve Outcome: Progressing   Problem: Safety: Goal: Ability to remain free from injury will improve Outcome: Progressing   Problem: Skin Integrity: Goal: Risk for impaired skin integrity will decrease Outcome: Progressing

## 2022-10-22 NOTE — H&P (Signed)
H&P  Patient Demographics:  Sandra Mckay, is a 27 y.o. female  MRN: [id number]   DOB - 1996-03-30  Admit Date - 10/22/2022  Outpatient Primary MD for the patient is Dorena Dew, FNP  Chief Complaint  Patient presents with   Fever   Sickle Cell Pain Crisis   Vomiting   Flank Pain   Dysuria      HPI:   Sandra Mckay  is a 27 y.o. female with a medical history significant for sickle cell disease, chronic pain syndrome, opiate dependence and tolerance, bipolar depression, history of pulmonary embolism on Xarelto and anemia of chronic disease presents to the emergency department with abdominal and low back pain over the past 4 days.  Also, patient endorses lower extremity pain that is consistent with her typical sickle cell pain crisis.  Of note, [name] also has a history of frequent urinary tract infections.  She endorses pain with urination.  Patient has been taking home opiate medications without very much relief.  Patient notified the infusion center for admission and was advised to report to the emergency department due to reports of fever, chills, and vomiting.  Patient states that vomiting has subsided.  Patient has been afebrile in the emergency department.  She rates her low back and lower extremity pain as 9/10.  Patient denies any dizziness, headache, shortness of breath, or chest pain.  Patient endorses nausea.  No vomiting or diarrhea at this time.  No sick contacts, recent travel, or known exposure to [place]. ER course: BP (!) 116/93   Pulse 89   Temp 99 F (37.2 C)   Resp (!) 21   Ht 5\' 2"  (1.575 m)   Wt 58.2 kg   LMP 10/15/2022 (Approximate)   SpO2 96%   BMI 23.47 kg/m  Patient remains afebrile and oxygen saturation 96% on room air.  Pertinent labs: Complete metabolic panel shows total bilirubin elevated at 4.3, otherwise unremarkable.  Complete blood count shows elevated WBCs at 19.  Patient's hemoglobin is consistent with her baseline at 8.7 g/dL, baseline is  8-9 g/dL.  Platelets elevated at 487,000.  Beta-hCG negative.  Urinalysis shows hazy appearance, small hemoglobin, rare bacteria and trace leukocytes.  Lipase negative.  CT of abdomen and pelvis shows diffuse urinary bladder wall thickening with urothelial hyperenhancement, suggestive of cystitis.  Also questionable subtle hypoenhancement of the right kidney, which may reflect early pyelonephritis, and prominence of biliary tree with the common duct measuring 16 mm and gentle tapering of the duct above the level of the ampulla, likely reflecting postcholecystectomy state.  Patient's pain persists despite IV Dilaudid, antiemetics, and IV fluids.  Patient will be admitted for pyelonephritis in the setting of sickle cell pain crisis.   Review of systems:  Review of Systems  Constitutional:  Positive for chills. Negative for fever.  HENT: Negative.    Eyes: Negative.   Respiratory:  Negative for shortness of breath.   Cardiovascular: Negative.  Negative for chest pain.  Gastrointestinal:  Positive for abdominal pain.  Genitourinary:  Positive for dysuria, flank pain and urgency. Negative for hematuria.  Musculoskeletal:  Positive for back pain and joint pain.  Skin: Negative.   Neurological: Negative.   Endo/Heme/Allergies: Negative.   Psychiatric/Behavioral: Negative.     .  With Past History of the following :   Past Medical History:  Diagnosis Date   Acute cystitis without hematuria 10/21/2019   Blood transfusion without reported diagnosis    Bronchitis    Chickenpox  Depression    Elevated ferritin level 05/2019   Heart murmur    Nausea without vomiting 09/09/2015   Pulmonary hypertension (HCC)    Sickle cell anemia (HCC)    Sickle cell disease, type SS ([place])    Sickle cell pain crisis ([place]) 12/05/2016   Thrombocytosis 05/2019   Urinary tract infection    Vitamin D deficiency       Past Surgical History:  Procedure Laterality Date   CHOLECYSTECTOMY  2011   EYE SURGERY     Sty  removal   IR IMAGING GUIDED PORT INSERTION  03/06/2022   IR REMOVAL TUN ACCESS W/ PORT W/O FL MOD SED  05/29/2022   LABIAL ADHESION LYSIS  1999   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   TONSILLECTOMY  2012     Social History:   Social History   Tobacco Use   Smoking status: Some Days    Packs/day: 1    Types: Cigarettes   Smokeless tobacco: Never   Tobacco comments:    1 pack twice a week  Substance Use Topics   Alcohol use: Yes    Alcohol/week: 0.0 standard drinks of alcohol    Comment: occ     Lives - At home   Family History :   Family History  Problem Relation Age of Onset   Arthritis Other        grandparent   Stroke Other    Hypertension Other    Diabetes Other        grandparent   Cancer - Other Other        Glioblastoma     Home Medications:   Prior to Admission medications   Medication Sig Start Date End Date Taking? Authorizing Provider  albuterol (VENTOLIN HFA) 108 (90 Base) MCG/ACT inhaler Inhale 3-4 puffs into the lungs 2 (two) times daily as needed for wheezing or shortness of breath. Patient not taking: Reported on 06/02/2022    [provider]  folic acid (FOLVITE) 1 MG tablet Take 1 tablet (1 mg total) by mouth daily. 04/20/22   Tresa Garter, MD  gabapentin (NEURONTIN) 100 MG capsule Take 3 capsules (300 mg total) by mouth 3 (three) times daily. Patient taking differently: Take 100 mg by mouth in the morning, at noon, and at bedtime. 02/26/22   Dorena Dew, FNP  hydroxyurea (HYDREA) 500 MG capsule Take 3 capsules (1,500 mg total) by mouth daily. TAKE 3 CAPSULES (1,500 MG TOTAL) BY MOUTH DAILY. Patient taking differently: Take 1,500 mg by mouth daily. 04/20/22   Tresa Garter, MD  oxyCODONE (ROXICODONE) 15 MG immediate release tablet Take 1 tablet (15 mg total) by mouth every 6 (six) hours as needed for up to 15 days for pain. 10/17/22 11/01/22  Fenton Foy, NP  oxyCODONE ER [facility] ER)  13.5 MG C12A Take 13.5 mg by mouth every 12 (twelve) hours. 10/02/22   Dorena Dew, FNP  QUEtiapine (SEROQUEL) 25 MG tablet TAKE 1 TABLET BY MOUTH EVERYDAY AT BEDTIME 06/03/22   Dorena Dew, FNP  rivaroxaban (XARELTO) 20 MG TABS tablet Take 1 tablet (20 mg total) by mouth daily with supper. 05/31/22   Tresa Garter, MD  voxelotor (OXBRYTA) 500 MG TABS tablet Take 1,000 mg by mouth daily. 09/14/22   Dorena Dew, FNP     Allergies:   Allergies  Allergen Reactions   Latex Rash   Wound Dressing Adhesive Rash  Physical Exam:   Vitals:   Vitals:   10/22/22 1400 10/22/22 1530  BP: (!) 108/55 (!) 116/93  Pulse: 87 89  Resp: (!) 21 (!) 21  Temp: 99 F (37.2 C)   SpO2: 93% 96%   Physical Exam Constitutional:      Appearance: She is ill-appearing. She is not diaphoretic.  Eyes:     Pupils: Pupils are equal, round, and reactive to light.  Cardiovascular:     Rate and Rhythm: Normal rate and regular rhythm.     Pulses: Normal pulses.  Pulmonary:     Effort: Pulmonary effort is normal.     Breath sounds: Normal breath sounds.  Abdominal:     General: There is no distension.     Tenderness: There is abdominal tenderness. There is right CVA tenderness and left CVA tenderness.  Musculoskeletal:        General: Normal range of motion.  Skin:    General: Skin is warm.  Neurological:     General: No focal deficit present.     Mental Status: Mental status is at baseline.  Psychiatric:        Mood and Affect: Mood normal.        Thought Content: Thought content normal.        Judgment: Judgment normal.       Data Review:   CBC Recent Labs  Lab 10/22/22 1128  WBC 19.0*  HGB 8.7*  HCT 23.9*  PLT 487*  MCV 112.7*  MCH 41.0*  MCHC 36.4*  RDW 17.8*  LYMPHSABS 2.0  MONOABS 2.9*  EOSABS 0.0  BASOSABS 0.0   ------------------------------------------------------------------------------------------------------------------  Chemistries  Recent Labs   Lab 10/22/22 1128  NA 131*  K 3.6  CL 99  CO2 23  GLUCOSE 107*  BUN 7  CREATININE 0.53  CALCIUM 8.9  AST 39  ALT 37  ALKPHOS 103  BILITOT 4.3*   ------------------------------------------------------------------------------------------------------------------ estimated creatinine clearance is 82.1 mL/min (by C-G formula based on SCr of 0.53 mg/dL). ------------------------------------------------------------------------------------------------------------------ No results for input(s): "TSH", "T4TOTAL", "T3FREE", "THYROIDAB" in the last 72 hours.  Invalid input(s): "FREET3"  Coagulation profile Recent Labs  Lab 10/22/22 1128  INR 2.6*   ------------------------------------------------------------------------------------------------------------------- No results for input(s): "DDIMER" in the last 72 hours. -------------------------------------------------------------------------------------------------------------------  Cardiac Enzymes No results for input(s): "CKMB", "TROPONINI", "MYOGLOBIN" in the last 168 hours.  Invalid input(s): "CK" ------------------------------------------------------------------------------------------------------------------ No results found for: "BNP"  ---------------------------------------------------------------------------------------------------------------  Urinalysis    Component Value Date/Time   COLORURINE YELLOW 10/22/2022 1235   APPEARANCEUR HAZY (A) 10/22/2022 1235   LABSPEC 1.009 10/22/2022 1235   PHURINE 8.0 10/22/2022 1235   GLUCOSEU NEGATIVE 10/22/2022 1235   HGBUR SMALL (A) 10/22/2022 1235   BILIRUBINUR NEGATIVE 10/22/2022 1235   BILIRUBINUR neg 08/06/2020 1525   KETONESUR NEGATIVE 10/22/2022 1235   PROTEINUR NEGATIVE 10/22/2022 1235   UROBILINOGEN 0.2 08/06/2020 1525   UROBILINOGEN >=8.0 08/04/2017 1144   NITRITE NEGATIVE 10/22/2022 1235   LEUKOCYTESUR TRACE (A) 10/22/2022 1235     ----------------------------------------------------------------------------------------------------------------   Imaging Results:    CT ABDOMEN PELVIS W CONTRAST  Result Date: 10/22/2022 CLINICAL DATA:  Flu-like symptoms, fever, flank pain, dysuria 2 days of sickle cell pain crisis. EXAM: CT ABDOMEN AND PELVIS WITH CONTRAST TECHNIQUE: Multidetector CT imaging of the abdomen and pelvis was performed using the standard protocol following bolus administration of intravenous contrast. RADIATION DOSE REDUCTION: This exam was performed according to the departmental dose-optimization program which includes automated exposure control, adjustment of the mA and/or kV  according to patient size and/or use of iterative reconstruction technique. CONTRAST:  146mL OMNIPAQUE IOHEXOL 300 MG/ML  SOLN COMPARISON:  Chest CT May 26, 2022. FINDINGS: Lower chest: Scarring/atelectasis in the bilateral lung bases. Hepatobiliary: No suspicious hepatic lesion. Gallbladder surgically absent. Prominence of the biliary tree with the common duct measuring 16 mm and gentle tapering of the duct to the level of the ampulla. Pancreas: No pancreatic ductal dilation or evidence of acute inflammation. 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 Adrenals/Urinary Tract: Bilateral adrenal glands appear normal. No hydronephrosis. Questionable subtle hypoenhancement of the right kidney. Diffuse urinary bladder wall thickening with urothelial hyperenhancement. Stomach/Bowel: Stomach is unremarkable for degree of distension. No pathologic dilation of small or large bowel. No evidence of acute bowel inflammation. Vascular/Lymphatic: Normal caliber abdominal aorta. Smooth IVC contours. No pathologically enlarged abdominal or pelvic lymph nodes. Reproductive: Dominant follicle in the right ovary measuring 2.2 cm is considered benign/physiologic and requires no independent imaging follow-up. Fluid in the endometrial canal commonly benign related to patient's  menstrual cycle. Other: Trace pelvic free fluid is within physiologic normal limits. Musculoskeletal: No acute osseous abnormality. IMPRESSION: 1. Diffuse urinary bladder wall thickening with urothelial hyperenhancement, suggestive of cystitis. 2. Questionable subtle hypoenhancement of the right kidney, which may reflect early pyelonephritis. Suggest correlation with laboratory values and CVA tenderness to palpation. 3. Prominence of the biliary tree with the common duct measuring 16 mm and gentle tapering of the duct to the level of the ampulla, likely reflecting post cholecystectomy state. Correlate with laboratory values to exclude biliary obstruction. Electronically Signed   By: Dahlia Bailiff M.D.   On: 10/22/2022 13:08     Assessment & Plan:  Principal Problem:   Sickle cell pain crisis ([place]) Active Problems:   Bipolar and related disorder ([place])   Leukocytosis   Pyelonephritis   History of pulmonary embolism  Sickle cell disease with pain crisis: Admit.  Initiate IV Dilaudid PCA with settings of 0.5 mg, 10-minute lockout, and 3 mg/h.  Awaiting bed placement, while in the emergency department Dilaudid 2 mg every 2 hours as needed. IV fluids, 0.45% saline at 100 mL/h Continue patient's home medications OxyContin 15 mg every 12 hours Oxycodone 15 mg every 6 hours as needed for severe breakthrough pain Toradol 15 mg IV every 6 hours Monitor vital signs very closely, reevaluate pain scale regularly, and supplemental oxygen as needed Patient will be reevaluated for pain in the context of function and relationship to baseline as care progresses.  Pyelonephritis: CT scan significant for right pyelonephritis.  Ceftriaxone given in the emergency department, will continue.  Continue hydration.  Follow urine culture.  Leukocytosis: WBCs elevated, probably secondary to infection.  Patient afebrile.  Continue IV antibiotics.  Follow labs in AM.  Bipolar depression: Continue home medications.   Seroquel 25 mg at bedtime.  No suicidal or homicidal intentions.  Monitor closely.  History of pulmonary embolism: Continue Xarelto  Anemia of chronic disease: Hemoglobin stable and consistent with patient's baseline.  There is no clinical indication for blood transfusion at this time.  Continue to monitor closely.  Continue folic acid, hydroxyurea, and Oxbryta.   Hb Sickle Cell Disease with crisis: Admit patient, start IVF D5 .45% Saline @ 125 mls/hour, start weight based Dilaudid PCA, start IV Toradol 15 mg Q 6 H, Restart oral home pain medications, Monitor vitals very closely, Re-evaluate pain scale regularly, 2 L of Oxygen by [place], Patient will be re-evaluated for pain in the context of function and relationship to baseline as care  progresses. Leukocytosis:  Anemia of Chronic Disease:  Chronic pain Syndrome:    DVT Prophylaxis: Subcut Lovenox   AM Labs Ordered, also please review Full Orders  Family Communication: Admission, patient's condition and plan of care including tests being ordered have been discussed with the patient who indicate understanding and agree with the plan and Code Status.  Code Status: Full Code  Consults called: None    Admission status: Inpatient    Time spent in minutes : 50 minutes  [place], MSN, FNP-C [facility] [address] [place], [place] [address] [phone]  10/22/2022 at 4:13 PM

## 2022-10-22 NOTE — ED Triage Notes (Signed)
Reports Flu like symptoms, fever, flank pain, dysuria, and has been sickle cell crisis pain all over x2 days. States she has been taking prescribe medications for pain w/ no relief. Also states she was on a course of antibiotics recently. Denies any other symptoms.

## 2022-10-23 ENCOUNTER — Ambulatory Visit: Payer: Medicaid Other | Admitting: Clinical

## 2022-10-23 DIAGNOSIS — Z9081 Acquired absence of spleen: Secondary | ICD-10-CM | POA: Diagnosis not present

## 2022-10-23 DIAGNOSIS — F3130 Bipolar disorder, current episode depressed, mild or moderate severity, unspecified: Secondary | ICD-10-CM | POA: Diagnosis present

## 2022-10-23 DIAGNOSIS — N12 Tubulo-interstitial nephritis, not specified as acute or chronic: Secondary | ICD-10-CM | POA: Diagnosis not present

## 2022-10-23 DIAGNOSIS — F419 Anxiety disorder, unspecified: Secondary | ICD-10-CM

## 2022-10-23 DIAGNOSIS — B961 Klebsiella pneumoniae [K. pneumoniae] as the cause of diseases classified elsewhere: Secondary | ICD-10-CM | POA: Diagnosis present

## 2022-10-23 DIAGNOSIS — G894 Chronic pain syndrome: Secondary | ICD-10-CM | POA: Diagnosis present

## 2022-10-23 DIAGNOSIS — E876 Hypokalemia: Secondary | ICD-10-CM | POA: Diagnosis present

## 2022-10-23 DIAGNOSIS — Z91040 Latex allergy status: Secondary | ICD-10-CM | POA: Diagnosis not present

## 2022-10-23 DIAGNOSIS — E871 Hypo-osmolality and hyponatremia: Secondary | ICD-10-CM | POA: Diagnosis present

## 2022-10-23 DIAGNOSIS — Z79899 Other long term (current) drug therapy: Secondary | ICD-10-CM | POA: Diagnosis not present

## 2022-10-23 DIAGNOSIS — F319 Bipolar disorder, unspecified: Secondary | ICD-10-CM

## 2022-10-23 DIAGNOSIS — Z91048 Other nonmedicinal substance allergy status: Secondary | ICD-10-CM | POA: Diagnosis not present

## 2022-10-23 DIAGNOSIS — Z7901 Long term (current) use of anticoagulants: Secondary | ICD-10-CM | POA: Diagnosis not present

## 2022-10-23 DIAGNOSIS — I272 Pulmonary hypertension, unspecified: Secondary | ICD-10-CM | POA: Diagnosis present

## 2022-10-23 DIAGNOSIS — Z86711 Personal history of pulmonary embolism: Secondary | ICD-10-CM | POA: Diagnosis not present

## 2022-10-23 DIAGNOSIS — D5700 Hb-SS disease with crisis, unspecified: Secondary | ICD-10-CM

## 2022-10-23 DIAGNOSIS — N10 Acute tubulo-interstitial nephritis: Secondary | ICD-10-CM | POA: Diagnosis present

## 2022-10-23 DIAGNOSIS — Z87440 Personal history of urinary (tract) infections: Secondary | ICD-10-CM | POA: Diagnosis not present

## 2022-10-23 LAB — CBC
HCT: 20.5 % — ABNORMAL LOW (ref 36.0–46.0)
Hemoglobin: 7.3 g/dL — ABNORMAL LOW (ref 12.0–15.0)
MCH: 40.8 pg — ABNORMAL HIGH (ref 26.0–34.0)
MCHC: 35.6 g/dL (ref 30.0–36.0)
MCV: 114.5 fL — ABNORMAL HIGH (ref 80.0–100.0)
Platelets: 399 10*3/uL (ref 150–400)
RBC: 1.79 MIL/uL — ABNORMAL LOW (ref 3.87–5.11)
RDW: 16 % — ABNORMAL HIGH (ref 11.5–15.5)
WBC: 30.8 10*3/uL — ABNORMAL HIGH (ref 4.0–10.5)
nRBC: 0.4 % — ABNORMAL HIGH (ref 0.0–0.2)

## 2022-10-23 LAB — BASIC METABOLIC PANEL
Anion gap: 12 (ref 5–15)
BUN: 14 mg/dL (ref 6–20)
CO2: 20 mmol/L — ABNORMAL LOW (ref 22–32)
Calcium: 8.4 mg/dL — ABNORMAL LOW (ref 8.9–10.3)
Chloride: 98 mmol/L (ref 98–111)
Creatinine, Ser: <0.3 mg/dL — ABNORMAL LOW (ref 0.44–1.00)
Glucose, Bld: 101 mg/dL — ABNORMAL HIGH (ref 70–99)
Potassium: 3.4 mmol/L — ABNORMAL LOW (ref 3.5–5.1)
Sodium: 130 mmol/L — ABNORMAL LOW (ref 135–145)

## 2022-10-23 MED ORDER — SODIUM CHLORIDE 0.45 % IV SOLN: INTRAVENOUS | Status: DC

## 2022-10-23 MED ORDER — OXYCODONE HCL 5 MG PO TABS
15.0000 mg | ORAL_TABLET | ORAL | Status: DC | PRN
  Administered 2022-10-23 – 2022-10-26 (×10): 15 mg via ORAL
  Filled 2022-10-23 (×10): qty 3
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
## 2022-10-23 NOTE — Progress Notes (Signed)
Subjective: Sandra Mckay is a 27 year old female with a medical history significant for sickle cell disease, chronic pain syndrome, opiate dependence and tolerance, bipolar depression, history of pulmonary embolism on Xarelto, and anemia of chronic disease was admitted with pyelonephritis in the setting of sickle cell pain crisis. Today, patient continues to complain of pain primarily to abdomen and low back.  Pain is rated as 8/10.  Pain persists despite IV Dilaudid PCA.  Patient periodically febrile overnight.  Maximum temperature 100.6.  Patient denies headache, dizziness, chest pain, urinary symptoms, nausea, vomiting, or diarrhea.  Objective:  Vital signs in last 24 hours:  Vitals:   10/23/22 0824 10/23/22 1118 10/23/22 1132 10/23/22 1338  BP:  106/64  110/71  Pulse:  (!) 101  91  Resp: 11 20 14 20   Temp:  99.9 F (37.7 C)  98.9 F (37.2 C)  TempSrc:  Oral  Oral  SpO2: 100% 93% 94% 90%  Weight:      Height:        Intake/Output from previous day:   Intake/Output Summary (Last 24 hours) at 10/23/2022 1536 Last data filed at 10/23/2022 0300 Gross per 24 hour  Intake 39 ml  Output --  Net 39 ml    Physical Exam: General: Alert, awake, oriented x3, in no acute distress.  HEENT: [place]/AT PEERL, EOMI.  Scleral icterus. Neck: Trachea midline,  no masses, no thyromegal,y no JVD, no carotid bruit OROPHARYNX:  Moist, No exudate/ erythema/lesions.  Heart: Regular rate and rhythm, without murmurs, rubs, gallops, PMI non-displaced, no heaves or thrills on palpation.  Lungs: Clear to auscultation, no wheezing or rhonchi noted. No increased vocal fremitus resonant to percussion  Abdomen: Soft, nontender, nondistended, positive bowel sounds, no masses no hepatosplenomegaly noted..  Neuro: No focal neurological deficits noted cranial nerves II through XII grossly intact. DTRs 2+ bilaterally upper and lower extremities. Strength 5 out of 5 in bilateral upper and lower  extremities. Musculoskeletal: No warm swelling or erythema around joints, no spinal tenderness noted. Psychiatric: Patient alert and oriented x3, good insight and cognition, good recent to remote recall. Lymph node survey: No cervical axillary or inguinal lymphadenopathy noted.  Lab Results:  Basic Metabolic Panel:    Component Value Date/Time   NA 130 (L) 10/23/2022 0647   NA 139 06/02/2022 1122   K 3.4 (L) 10/23/2022 0647   CL 98 10/23/2022 0647   CO2 20 (L) 10/23/2022 0647   BUN 14 10/23/2022 0647   BUN 7 06/02/2022 1122   CREATININE <0.30 (L) 10/23/2022 0647   CREATININE 0.53 05/31/2017 1145   GLUCOSE 101 (H) 10/23/2022 0647   CALCIUM 8.4 (L) 10/23/2022 0647   CBC:    Component Value Date/Time   WBC 30.8 (H) 10/23/2022 0647   HGB 7.3 (L) 10/23/2022 0647   HGB 7.9 (L) 06/02/2022 1122   HCT 20.5 (L) 10/23/2022 0647   HCT 23.3 (L) 06/02/2022 1122   PLT 399 10/23/2022 0647   PLT 470 (H) 06/02/2022 1122   MCV 114.5 (H) 10/23/2022 0647   MCV 106 (H) 06/02/2022 1122   NEUTROABS 14.0 (H) 10/22/2022 1128   NEUTROABS 3.3 06/02/2022 1122   LYMPHSABS 2.0 10/22/2022 1128   LYMPHSABS 2.3 06/02/2022 1122   MONOABS 2.9 (H) 10/22/2022 1128   EOSABS 0.0 10/22/2022 1128   EOSABS 0.0 06/02/2022 1122   BASOSABS 0.0 10/22/2022 1128   BASOSABS 0.1 06/02/2022 1122    Recent Results (from the past 240 hour(s))  Urine Culture (for pregnant, neutropenic or urologic patients  or patients with an indwelling urinary catheter)     Status: Abnormal (Preliminary result)   Collection Time: 10/22/22 12:35 PM   Specimen: Urine, Clean Catch  Result Value Ref Range Status   Specimen Description   Final    URINE, CLEAN CATCH Performed at [facility], [place] [address], [place], [place] [address]    Special Requests   Final    NONE Performed at [facility], [place] [address] [place], [place] [address]    Culture (A)  Final    >=100,000 COLONIES/mL KLEBSIELLA  PNEUMONIAE SUSCEPTIBILITIES TO FOLLOW Performed at [facility], [place] [address] [place], [place] [address]    Report Status PENDING  Incomplete    Studies/Results: CT ABDOMEN PELVIS W CONTRAST  Result Date: 10/22/2022 CLINICAL DATA:  Flu-like symptoms, fever, flank pain, dysuria 2 days of sickle cell pain crisis. EXAM: CT ABDOMEN AND PELVIS WITH CONTRAST TECHNIQUE: Multidetector CT imaging of the abdomen and pelvis was performed using the standard protocol following bolus administration of intravenous contrast. RADIATION DOSE REDUCTION: This exam was performed according to the departmental dose-optimization program which includes automated exposure control, adjustment of the mA and/or kV according to patient size and/or use of iterative reconstruction technique. CONTRAST:  168mL OMNIPAQUE IOHEXOL 300 MG/ML  SOLN COMPARISON:  Chest CT May 26, 2022. FINDINGS: Lower chest: Scarring/atelectasis in the bilateral lung bases. Hepatobiliary: No suspicious hepatic lesion. Gallbladder surgically absent. Prominence of the biliary tree with the common duct measuring 16 mm and gentle tapering of the duct to the level of the ampulla. Pancreas: No pancreatic ductal dilation or evidence of acute inflammation. 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 Adrenals/Urinary Tract: Bilateral adrenal glands appear normal. No hydronephrosis. Questionable subtle hypoenhancement of the right kidney. Diffuse urinary bladder wall thickening with urothelial hyperenhancement. Stomach/Bowel: Stomach is unremarkable for degree of distension. No pathologic dilation of small or large bowel. No evidence of acute bowel inflammation. Vascular/Lymphatic: Normal caliber abdominal aorta. Smooth IVC contours. No pathologically enlarged abdominal or pelvic lymph nodes. Reproductive: Dominant follicle in the right ovary measuring 2.2 cm is considered benign/physiologic and requires no independent imaging follow-up. Fluid in the endometrial canal commonly  benign related to patient's menstrual cycle. Other: Trace pelvic free fluid is within physiologic normal limits. Musculoskeletal: No acute osseous abnormality. IMPRESSION: 1. Diffuse urinary bladder wall thickening with urothelial hyperenhancement, suggestive of cystitis. 2. Questionable subtle hypoenhancement of the right kidney, which may reflect early pyelonephritis. Suggest correlation with laboratory values and CVA tenderness to palpation. 3. Prominence of the biliary tree with the common duct measuring 16 mm and gentle tapering of the duct to the level of the ampulla, likely reflecting post cholecystectomy state. Correlate with laboratory values to exclude biliary obstruction. Electronically Signed   By: Dahlia Bailiff M.D.   On: 10/22/2022 13:08    Medications: Scheduled Meds:  folic acid  1 mg Oral Daily   gabapentin  300 mg Oral TID   HYDROmorphone   Intravenous Q4H   hydroxyurea  1,500 mg Oral Daily   ketorolac  15 mg Intravenous Q6H   oxyCODONE  15 mg Oral Q12H   QUEtiapine  25 mg Oral QHS   rivaroxaban  20 mg Oral Q supper   senna-docusate  1 tablet Oral BID   voxelotor  1,000 mg Oral Daily   Continuous Infusions:  cefTRIAXone (ROCEPHIN)  IV 1 g (10/23/22 1038)   promethazine (PHENERGAN) injection (IM or IVPB)     PRN Meds:.acetaminophen, albuterol, diphenhydrAMINE, naloxone **AND**  sodium chloride flush, ondansetron (ZOFRAN) IV, oxyCODONE, polyethylene glycol  Consultants: none  Procedures: None  Antibiotics: Ceftriaxone  Assessment/Plan: Principal Problem:   Sickle cell pain crisis ([place]) Active Problems:   Bipolar and related disorder (HCC)   Leukocytosis   Pyelonephritis   History of pulmonary embolism  Sickle cell disease with pain crisis: Continue IV Dilaudid PCA without any changes 0.45% saline at 100 mL/h Toradol 15 mg IV x 1 Continue home medications.  OxyContin 15 mg every 12 hours and oxycodone 15 mg every 4 hours as needed for severe breakthrough  pain Monitor vital signs very closely, reevaluate pain scale regularly, and supplemental oxygen as needed  Sepsis/pyelonephritis: Continue IV antibiotics.  Continue gentle hydration.  Tylenol 650 mg every 4 hours as needed for fever.  Continue to monitor closely.  Follow urine culture, preliminary shows Klebsiella pneumonia greater than 100,000 colonies.  Will await sensitivities.  Chronic pain syndrome: Continue home medications  Leukocytosis: WBC is increasing.  Patient febrile overnight.  Continue IV antibiotics.  Monitor closely.  Follow labs in AM.  History of PE: Continue Xarelto  Anemia of chronic disease: Patient's hemoglobin is decreased to 7.3 g/dL, which is below her baseline.  Will repeat CBC and type and screen in AM.  If hemoglobin is less than 7, transfuse 1 unit PRBCs.   Code Status: Full Code Family Communication: N/A Disposition Plan: Not yet ready for discharge  [place], MSN, FNP-C Patient [place] [address] [place], [place] [address] [phone]  If 7PM-7AM, please contact night-coverage.  10/23/2022, 3:36 PM  LOS: 0 days

## 2022-10-24 DIAGNOSIS — D5700 Hb-SS disease with crisis, unspecified: Secondary | ICD-10-CM | POA: Diagnosis not present

## 2022-10-24 LAB — CBC WITH DIFFERENTIAL/PLATELET
Abs Immature Granulocytes: 0.14 10*3/uL — ABNORMAL HIGH (ref 0.00–0.07)
Basophils Absolute: 0 10*3/uL (ref 0.0–0.1)
Basophils Relative: 0 %
Eosinophils Absolute: 0 10*3/uL (ref 0.0–0.5)
Eosinophils Relative: 0 %
HCT: 17.2 % — ABNORMAL LOW (ref 36.0–46.0)
Hemoglobin: 6.1 g/dL — CL (ref 12.0–15.0)
Immature Granulocytes: 1 %
Lymphocytes Relative: 13 %
Lymphs Abs: 2.7 10*3/uL (ref 0.7–4.0)
MCH: 38.6 pg — ABNORMAL HIGH (ref 26.0–34.0)
MCHC: 35.5 g/dL (ref 30.0–36.0)
MCV: 108.9 fL — ABNORMAL HIGH (ref 80.0–100.0)
Monocytes Absolute: 2.9 10*3/uL — ABNORMAL HIGH (ref 0.1–1.0)
Monocytes Relative: 14 %
Neutro Abs: 15.6 10*3/uL — ABNORMAL HIGH (ref 1.7–7.7)
Neutrophils Relative %: 72 %
Platelets: 359 10*3/uL (ref 150–400)
RBC: 1.58 MIL/uL — ABNORMAL LOW (ref 3.87–5.11)
RDW: 15.6 % — ABNORMAL HIGH (ref 11.5–15.5)
WBC: 21.5 10*3/uL — ABNORMAL HIGH (ref 4.0–10.5)
nRBC: 0.3 % — ABNORMAL HIGH (ref 0.0–0.2)

## 2022-10-24 LAB — BASIC METABOLIC PANEL
Anion gap: 7 (ref 5–15)
BUN: 9 mg/dL (ref 6–20)
CO2: 24 mmol/L (ref 22–32)
Calcium: 8.4 mg/dL — ABNORMAL LOW (ref 8.9–10.3)
Chloride: 102 mmol/L (ref 98–111)
Creatinine, Ser: <0.3 mg/dL — ABNORMAL LOW (ref 0.44–1.00)
Glucose, Bld: 110 mg/dL — ABNORMAL HIGH (ref 70–99)
Potassium: 3 mmol/L — ABNORMAL LOW (ref 3.5–5.1)
Sodium: 133 mmol/L — ABNORMAL LOW (ref 135–145)

## 2022-10-24 LAB — PREPARE RBC (CROSSMATCH)

## 2022-10-24 LAB — URINE CULTURE: Culture: >=100000 — AB

## 2022-10-24 MED ORDER — SODIUM CHLORIDE 0.9% IV SOLUTION: Freq: Once | INTRAVENOUS | Status: AC

## 2022-10-24 MED ORDER — DIPHENHYDRAMINE HCL 50 MG/ML IJ SOLN
25.0000 mg | Freq: Once | INTRAMUSCULAR | Status: AC
  Administered 2022-10-24: 25 mg via INTRAVENOUS
  Filled 2022-10-24: qty 1

## 2022-10-24 MED ORDER — MELATONIN 3 MG PO TABS
6.0000 mg | ORAL_TABLET | Freq: Every evening | ORAL | Status: AC | PRN
  Administered 2022-10-24: 6 mg via ORAL
  Filled 2022-10-24: qty 2

## 2022-10-24 MED ORDER — POTASSIUM CHLORIDE CRYS ER 20 MEQ PO TBCR
40.0000 meq | EXTENDED_RELEASE_TABLET | Freq: Three times a day (TID) | ORAL | Status: AC
  Administered 2022-10-24 (×3): 40 meq via ORAL
  Filled 2022-10-24 (×3): qty 2

## 2022-10-24 NOTE — Progress Notes (Signed)
Date and time results received: 10/24/22 0954 (use smartphrase ".now" to insert current time)  Test: hgb  Critical Value: 6.6  Name of Provider Notified: Doreene Burke

## 2022-10-24 NOTE — Progress Notes (Signed)
SICKLE CELL SERVICE PROGRESS NOTE  Sandra Mckay [id number] DOB: 1996/03/21 DOA: 10/22/2022 PCP: Dorena Dew, FNP  Assessment/Plan: Principal Problem:   Sickle cell pain crisis [facility]) Active Problems:   Bipolar and related disorder ([place])   Leukocytosis   Pyelonephritis   History of pulmonary embolism  Sickle Cell Pain Crisis: Patient currently on Dilaudid PCA, Toradol, OxyContin, also IV fluids with D5 half-normal.  Hemoglobin has dropped.  Will continue with pain management and adjusting medications accordingly. Acute on chronic anemia: Hemoglobin has dropped to 6.1.  We will transfuse 1 unit of packed red blood cells on the monitor.   bipolar disorder: Continue home regimen. Acute pyelonephritis: Continue antibiotics.  Urine culture being followed. Leukocytosis: White count has improved from 30,000 down to 21,000.  Continue antibiotics Hypokalemia: Continue to replete potassium Hyponatremia: Sodium is slightly improved to 133. History of pulmonary embolism: Continue chronic anticoagulation with Xarelto  Code Status: Full code Family Communication: Mother at bedside Disposition Plan: [place]  Pager [phone]. If 7PM-7AM, please contact night-coverage.  10/24/2022, 5:16 PM  LOS: 1 day   Brief narrative: Sandra Mckay  is a 27 y.o. female with a medical history significant for sickle cell disease, chronic pain syndrome, opiate dependence and tolerance, bipolar depression, history of pulmonary embolism on Xarelto and anemia of chronic disease presents to the emergency department with abdominal and low back pain over the past 4 days.  Also, patient endorses lower extremity pain that is consistent with her typical sickle cell pain crisis.  Of note, Sandra Mckay also has a history of frequent urinary tract infections.  She endorses pain with urination.  Patient has been taking home opiate medications without very much relief.  Patient notified the infusion center for  admission and was advised to report to the emergency department due to reports of fever, chills, and vomiting.  Patient states that vomiting has subsided.  Patient has been afebrile in the emergency department.  She rates her low back and lower extremity pain as 9/10.  Patient denies any dizziness, headache, shortness of breath, or chest pain.  Patient endorses nausea.  No vomiting or diarrhea at this time.  No sick contacts, recent travel, or known exposure to [place].   Consultants: None  Procedures: Chest x-ray  Antibiotics: IV Rocephin  HPI/Subjective: Patient still having pain is 7 out of 10.  Awaiting blood transfusion.  She is hemodynamically stable otherwise.  Hemoglobin is down to 6.1.  White count is improving.  No fever overnight.  Objective: Vitals:   10/24/22 1325 10/24/22 1402 10/24/22 1439 10/24/22 1448  BP: 104/65 102/63 112/68 112/68  Pulse: 93 82 93 93  Resp: 18 17 16 16   Temp: 98.3 F (36.8 C) 98.7 F (37.1 C) 98.1 F (36.7 C) 98.1 F (36.7 C)  TempSrc: Oral Oral  Oral  SpO2: 100% 94%  100%  Weight:      Height:       Weight change:   Intake/Output Summary (Last 24 hours) at 10/24/2022 1716 Last data filed at 10/24/2022 1552 Gross per 24 hour  Intake 2680.85 ml  Output --  Net 2680.85 ml    General: Alert, awake, oriented x3, in no acute distress.  HEENT: [place]/AT PEERL, EOMI Neck: Trachea midline,  no masses, no thyromegal,y no JVD, no carotid bruit OROPHARYNX:  Moist, No exudate/ erythema/lesions.  Heart: Regular rate and rhythm, without murmurs, rubs, gallops, PMI non-displaced, no heaves or thrills on palpation.  Lungs: Clear to auscultation, no wheezing or rhonchi  noted. No increased vocal fremitus resonant to percussion  Abdomen: Soft, nontender, nondistended, positive bowel sounds, no masses no hepatosplenomegaly noted..  Neuro: No focal neurological deficits noted cranial nerves II through XII grossly intact. DTRs 2+ bilaterally upper and lower  extremities. Strength 5 out of 5 in bilateral upper and lower extremities. Musculoskeletal: No warm swelling or erythema around joints, no spinal tenderness noted. Psychiatric: Patient alert and oriented x3, good insight and cognition, good recent to remote recall. Lymph node survey: No cervical axillary or inguinal lymphadenopathy noted.   Data Reviewed: Basic Metabolic Panel: Recent Labs  Lab 10/22/22 1128 10/23/22 0647 10/24/22 0614  NA 131* 130* 133*  K 3.6 3.4* 3.0*  CL 99 98 102  CO2 23 20* 24  GLUCOSE 107* 101* 110*  BUN 7 14 9   CREATININE 0.53 <0.30* <0.30*  CALCIUM 8.9 8.4* 8.4*   Liver Function Tests: Recent Labs  Lab 10/22/22 1128  AST 39  ALT 37  ALKPHOS 103  BILITOT 4.3*  PROT 7.5  ALBUMIN 4.8   Recent Labs  Lab 10/22/22 1128  LIPASE 22   No results for input(s): "AMMONIA" in the last 168 hours. CBC: Recent Labs  Lab 10/22/22 1128 10/23/22 0647 10/24/22 0836  WBC 19.0* 30.8* 21.5*  NEUTROABS 14.0*  --  15.6*  HGB 8.7* 7.3* 6.1*  HCT 23.9* 20.5* 17.2*  MCV 112.7* 114.5* 108.9*  PLT 487* 399 359   Cardiac Enzymes: No results for input(s): "CKTOTAL", "CKMB", "CKMBINDEX", "TROPONINI" in the last 168 hours. BNP (last 3 results) No results for input(s): "BNP" in the last 8760 hours.  ProBNP (last 3 results) No results for input(s): "PROBNP" in the last 8760 hours.  CBG: No results for input(s): "GLUCAP" in the last 168 hours.  Recent Results (from the past 240 hour(s))  Urine Culture (for pregnant, neutropenic or urologic patients or patients with an indwelling urinary catheter)     Status: Abnormal   Collection Time: 10/22/22 12:35 PM   Specimen: Urine, Clean Catch  Result Value Ref Range Status   Specimen Description   Final    URINE, CLEAN CATCH Performed at [facility], [place] [address] [place], [place] [address]    Special Requests   Final    NONE Performed at [facility], [place] [address], [place], [place] [address]    Culture >=100,000 COLONIES/mL KLEBSIELLA PNEUMONIAE (A)  Final   Report Status 10/24/2022 FINAL  Final   Organism ID, Bacteria KLEBSIELLA PNEUMONIAE (A)  Final      Susceptibility   Klebsiella pneumoniae - MIC*    AMPICILLIN RESISTANT Resistant     CEFAZOLIN <=4 SENSITIVE Sensitive     CEFEPIME <=0.12 SENSITIVE Sensitive     CEFTRIAXONE <=0.25 SENSITIVE Sensitive     CIPROFLOXACIN <=0.25 SENSITIVE Sensitive     GENTAMICIN <=1 SENSITIVE Sensitive     IMIPENEM <=0.25 SENSITIVE Sensitive     NITROFURANTOIN <=16 SENSITIVE Sensitive     TRIMETH/SULFA <=20 SENSITIVE Sensitive     AMPICILLIN/SULBACTAM 4 SENSITIVE Sensitive     PIP/TAZO <=4 SENSITIVE Sensitive     * >=100,000 COLONIES/mL KLEBSIELLA PNEUMONIAE     Studies: CT ABDOMEN PELVIS W CONTRAST  Result Date: 10/22/2022 CLINICAL DATA:  Flu-like symptoms, fever, flank pain, dysuria 2 days of sickle cell pain crisis. EXAM: CT ABDOMEN AND PELVIS WITH CONTRAST TECHNIQUE: Multidetector CT imaging of the abdomen and pelvis was performed using the standard protocol following bolus administration of intravenous contrast. RADIATION DOSE REDUCTION: This  exam was performed according to the departmental dose-optimization program which includes automated exposure control, adjustment of the mA and/or kV according to patient size and/or use of iterative reconstruction technique. CONTRAST:  145mL OMNIPAQUE IOHEXOL 300 MG/ML  SOLN COMPARISON:  Chest CT May 26, 2022. FINDINGS: Lower chest: Scarring/atelectasis in the bilateral lung bases. Hepatobiliary: No suspicious hepatic lesion. Gallbladder surgically absent. Prominence of the biliary tree with the common duct measuring 16 mm and gentle tapering of the duct to the level of the ampulla. Pancreas: No pancreatic ductal dilation or evidence of acute inflammation. 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 Adrenals/Urinary Tract: Bilateral adrenal glands appear normal. No hydronephrosis.  Questionable subtle hypoenhancement of the right kidney. Diffuse urinary bladder wall thickening with urothelial hyperenhancement. Stomach/Bowel: Stomach is unremarkable for degree of distension. No pathologic dilation of small or large bowel. No evidence of acute bowel inflammation. Vascular/Lymphatic: Normal caliber abdominal aorta. Smooth IVC contours. No pathologically enlarged abdominal or pelvic lymph nodes. Reproductive: Dominant follicle in the right ovary measuring 2.2 cm is considered benign/physiologic and requires no independent imaging follow-up. Fluid in the endometrial canal commonly benign related to patient's menstrual cycle. Other: Trace pelvic free fluid is within physiologic normal limits. Musculoskeletal: No acute osseous abnormality. IMPRESSION: 1. Diffuse urinary bladder wall thickening with urothelial hyperenhancement, suggestive of cystitis. 2. Questionable subtle hypoenhancement of the right kidney, which may reflect early pyelonephritis. Suggest correlation with laboratory values and CVA tenderness to palpation. 3. Prominence of the biliary tree with the common duct measuring 16 mm and gentle tapering of the duct to the level of the ampulla, likely reflecting post cholecystectomy state. Correlate with laboratory values to exclude biliary obstruction. Electronically Signed   By: Dahlia Bailiff M.D.   On: 10/22/2022 13:08    Scheduled Meds:  folic acid  1 mg Oral Daily   gabapentin  300 mg Oral TID   HYDROmorphone   Intravenous Q4H   hydroxyurea  1,500 mg Oral Daily   ketorolac  15 mg Intravenous Q6H   oxyCODONE  15 mg Oral Q12H   potassium chloride  40 mEq Oral TID   QUEtiapine  25 mg Oral QHS   rivaroxaban  20 mg Oral Q supper   senna-docusate  1 tablet Oral BID   voxelotor  1,000 mg Oral Daily   Continuous Infusions:  sodium chloride 100 mL/hr at 10/24/22 1552   cefTRIAXone (ROCEPHIN)  IV Stopped (10/24/22 1031)   promethazine (PHENERGAN) injection (IM or IVPB)       Principal Problem:   Sickle cell pain crisis ([place]) Active Problems:   Bipolar and related disorder (HCC)   Leukocytosis   Pyelonephritis   History of pulmonary embolism

## 2022-10-25 DIAGNOSIS — D5700 Hb-SS disease with crisis, unspecified: Secondary | ICD-10-CM | POA: Diagnosis not present

## 2022-10-25 LAB — CBC
HCT: 19 % — ABNORMAL LOW (ref 36.0–46.0)
Hemoglobin: 6.7 g/dL — CL (ref 12.0–15.0)
MCH: 34.9 pg — ABNORMAL HIGH (ref 26.0–34.0)
MCHC: 35.3 g/dL (ref 30.0–36.0)
MCV: 99 fL (ref 80.0–100.0)
Platelets: 361 10*3/uL (ref 150–400)
RBC: 1.92 MIL/uL — ABNORMAL LOW (ref 3.87–5.11)
RDW: 22.6 % — ABNORMAL HIGH (ref 11.5–15.5)
WBC: 10.7 10*3/uL — ABNORMAL HIGH (ref 4.0–10.5)
nRBC: 0.5 % — ABNORMAL HIGH (ref 0.0–0.2)

## 2022-10-25 LAB — PREPARE RBC (CROSSMATCH)

## 2022-10-25 MED ORDER — MELATONIN 3 MG PO TABS
6.0000 mg | ORAL_TABLET | Freq: Every evening | ORAL | Status: DC | PRN
  Administered 2022-10-25: 6 mg via ORAL
  Filled 2022-10-25: qty 2

## 2022-10-25 MED ORDER — DIPHENHYDRAMINE HCL 50 MG/ML IJ SOLN
25.0000 mg | Freq: Once | INTRAMUSCULAR | Status: AC
  Administered 2022-10-25: 25 mg via INTRAVENOUS
  Filled 2022-10-25: qty 1

## 2022-10-25 MED ORDER — SODIUM CHLORIDE 0.9% IV SOLUTION: Freq: Once | INTRAVENOUS | Status: AC

## 2022-10-25 NOTE — Progress Notes (Signed)
SICKLE CELL SERVICE PROGRESS NOTE  Bexley Boothe [id number] DOB: 1996-03-05 DOA: 10/22/2022 PCP: Dorena Dew, FNP  Assessment/Plan: Principal Problem:   Sickle cell pain crisis ([place]) Active Problems:   Bipolar and related disorder ([place])   Leukocytosis   Pyelonephritis   History of pulmonary embolism  Sickle Cell Pain Crisis: Pain is down to 6 out of 10 today.  Patient currently on Dilaudid PCA, Toradol, OxyContin, also IV fluids with D5 half-normal.  Hemoglobin has dropped.  Will continue with pain management and adjusting medications accordingly. Acute on chronic anemia: Status posttransfusion 1 unit packed red blood cells.  Awaiting repeat labs.  Hemoglobin has dropped to 6.1.  Patient then was transfused a unit.  If is still low we may transfuse another unit. bipolar disorder: Continue home regimen. Acute pyelonephritis: Continue antibiotics.  Urine culture being followed. Leukocytosis: White count has improved from 30,000 down to 21,000 yesterday.  Awaiting repeat labs this morning.  Continue antibiotics Hypokalemia: Continue to replete potassium Hyponatremia: Sodium is slightly improved to 133. History of pulmonary embolism: Continue chronic anticoagulation with Xarelto  Code Status: Full code Family Communication: Mother at bedside Disposition Plan: [place]  Pager [phone]. If 7PM-7AM, please contact night-coverage.  10/25/2022, 9:03 AM  LOS: 2 days   Brief narrative: Dalana Dold  is a 27 y.o. female with a medical history significant for sickle cell disease, chronic pain syndrome, opiate dependence and tolerance, bipolar depression, history of pulmonary embolism on Xarelto and anemia of chronic disease presents to the emergency department with abdominal and low back pain over the past 4 days.  Also, patient endorses lower extremity pain that is consistent with her typical sickle cell pain crisis.  Of note, Unita also has a history of frequent  urinary tract infections.  She endorses pain with urination.  Patient has been taking home opiate medications without very much relief.  Patient notified the infusion center for admission and was advised to report to the emergency department due to reports of fever, chills, and vomiting.  Patient states that vomiting has subsided.  Patient has been afebrile in the emergency department.  She rates her low back and lower extremity pain as 9/10.  Patient denies any dizziness, headache, shortness of breath, or chest pain.  Patient endorses nausea.  No vomiting or diarrhea at this time.  No sick contacts, recent travel, or known exposure to [place].   Consultants: None  Procedures: Chest x-ray  Antibiotics: IV Rocephin  HPI/Subjective: Patient still has some pain but much improved.  Down to 5-6 out of 10.  Received a unit of packed red blood cells yesterday.  Awaiting repeat labs on the results.  Objective: Vitals:   10/25/22 0155 10/25/22 0502 10/25/22 0508 10/25/22 0741  BP: (!) 95/56 99/73    Pulse: 83 80    Resp: 14 14 16 17   Temp: 98 F (36.7 C) 98.2 F (36.8 C)    TempSrc: Oral Oral    SpO2: 93% 91% 94% 96%  Weight:      Height:       Weight change:   Intake/Output Summary (Last 24 hours) at 10/25/2022 [id number] Last data filed at 10/25/2022 0300 Gross per 24 hour  Intake 3244.09 ml  Output --  Net 3244.09 ml     General: Alert, awake, oriented x3, in no acute distress.  HEENT: [place]/AT PEERL, EOMI Neck: Trachea midline,  no masses, no thyromegal,y no JVD, no carotid bruit OROPHARYNX:  Moist, No exudate/ erythema/lesions.  Heart:  Regular rate and rhythm, without murmurs, rubs, gallops, PMI non-displaced, no heaves or thrills on palpation.  Lungs: Clear to auscultation, no wheezing or rhonchi noted. No increased vocal fremitus resonant to percussion  Abdomen: Soft, nontender, nondistended, positive bowel sounds, no masses no hepatosplenomegaly noted..  Neuro: No focal neurological  deficits noted cranial nerves II through XII grossly intact. DTRs 2+ bilaterally upper and lower extremities. Strength 5 out of 5 in bilateral upper and lower extremities. Musculoskeletal: No warm swelling or erythema around joints, no spinal tenderness noted. Psychiatric: Patient alert and oriented x3, good insight and cognition, good recent to remote recall. Lymph node survey: No cervical axillary or inguinal lymphadenopathy noted.   Data Reviewed: Basic Metabolic Panel: Recent Labs  Lab 10/22/22 1128 10/23/22 0647 10/24/22 0614  NA 131* 130* 133*  K 3.6 3.4* 3.0*  CL 99 98 102  CO2 23 20* 24  GLUCOSE 107* 101* 110*  BUN 7 14 9   CREATININE 0.53 <0.30* <0.30*  CALCIUM 8.9 8.4* 8.4*    Liver Function Tests: Recent Labs  Lab 10/22/22 1128  AST 39  ALT 37  ALKPHOS 103  BILITOT 4.3*  PROT 7.5  ALBUMIN 4.8    Recent Labs  Lab 10/22/22 1128  LIPASE 22    No results for input(s): "AMMONIA" in the last 168 hours. CBC: Recent Labs  Lab 10/22/22 1128 10/23/22 0647 10/24/22 0836  WBC 19.0* 30.8* 21.5*  NEUTROABS 14.0*  --  15.6*  HGB 8.7* 7.3* 6.1*  HCT 23.9* 20.5* 17.2*  MCV 112.7* 114.5* 108.9*  PLT 487* 399 359    Cardiac Enzymes: No results for input(s): "CKTOTAL", "CKMB", "CKMBINDEX", "TROPONINI" in the last 168 hours. BNP (last 3 results) No results for input(s): "BNP" in the last 8760 hours.  ProBNP (last 3 results) No results for input(s): "PROBNP" in the last 8760 hours.  CBG: No results for input(s): "GLUCAP" in the last 168 hours.  Recent Results (from the past 240 hour(s))  Urine Culture (for pregnant, neutropenic or urologic patients or patients with an indwelling urinary catheter)     Status: Abnormal   Collection Time: 10/22/22 12:35 PM   Specimen: Urine, Clean Catch  Result Value Ref Range Status   Specimen Description   Final    URINE, CLEAN CATCH Performed at [facility], [place] [address], [place], [place] [address]     Special Requests   Final    NONE Performed at [facility], [place] [address] [place], [place] [address]    Culture >=100,000 COLONIES/mL KLEBSIELLA PNEUMONIAE (A)  Final   Report Status 10/24/2022 FINAL  Final   Organism ID, Bacteria KLEBSIELLA PNEUMONIAE (A)  Final      Susceptibility   Klebsiella pneumoniae - MIC*    AMPICILLIN RESISTANT Resistant     CEFAZOLIN <=4 SENSITIVE Sensitive     CEFEPIME <=0.12 SENSITIVE Sensitive     CEFTRIAXONE <=0.25 SENSITIVE Sensitive     CIPROFLOXACIN <=0.25 SENSITIVE Sensitive     GENTAMICIN <=1 SENSITIVE Sensitive     IMIPENEM <=0.25 SENSITIVE Sensitive     NITROFURANTOIN <=16 SENSITIVE Sensitive     TRIMETH/SULFA <=20 SENSITIVE Sensitive     AMPICILLIN/SULBACTAM 4 SENSITIVE Sensitive     PIP/TAZO <=4 SENSITIVE Sensitive     * >=100,000 COLONIES/mL KLEBSIELLA PNEUMONIAE     Studies: CT ABDOMEN PELVIS W CONTRAST  Result Date: 10/22/2022 CLINICAL DATA:  Flu-like symptoms, fever, flank pain, dysuria 2 days of sickle cell pain crisis. EXAM: CT ABDOMEN  AND PELVIS WITH CONTRAST TECHNIQUE: Multidetector CT imaging of the abdomen and pelvis was performed using the standard protocol following bolus administration of intravenous contrast. RADIATION DOSE REDUCTION: This exam was performed according to the departmental dose-optimization program which includes automated exposure control, adjustment of the mA and/or kV according to patient size and/or use of iterative reconstruction technique. CONTRAST:  150mL OMNIPAQUE IOHEXOL 300 MG/ML  SOLN COMPARISON:  Chest CT May 26, 2022. FINDINGS: Lower chest: Scarring/atelectasis in the bilateral lung bases. Hepatobiliary: No suspicious hepatic lesion. Gallbladder surgically absent. Prominence of the biliary tree with the common duct measuring 16 mm and gentle tapering of the duct to the level of the ampulla. Pancreas: No pancreatic ductal dilation or evidence of acute inflammation. Spleen: Spleen is  absent. Adrenals/Urinary Tract: Bilateral adrenal glands appear normal. No hydronephrosis. Questionable subtle hypoenhancement of the right kidney. Diffuse urinary bladder wall thickening with urothelial hyperenhancement. Stomach/Bowel: Stomach is unremarkable for degree of distension. No pathologic dilation of small or large bowel. No evidence of acute bowel inflammation. Vascular/Lymphatic: Normal caliber abdominal aorta. Smooth IVC contours. No pathologically enlarged abdominal or pelvic lymph nodes. Reproductive: Dominant follicle in the right ovary measuring 2.2 cm is considered benign/physiologic and requires no independent imaging follow-up. Fluid in the endometrial canal commonly benign related to patient's menstrual cycle. Other: Trace pelvic free fluid is within physiologic normal limits. Musculoskeletal: No acute osseous abnormality. IMPRESSION: 1. Diffuse urinary bladder wall thickening with urothelial hyperenhancement, suggestive of cystitis. 2. Questionable subtle hypoenhancement of the right kidney, which may reflect early pyelonephritis. Suggest correlation with laboratory values and CVA tenderness to palpation. 3. Prominence of the biliary tree with the common duct measuring 16 mm and gentle tapering of the duct to the level of the ampulla, likely reflecting post cholecystectomy state. Correlate with laboratory values to exclude biliary obstruction. Electronically Signed   By: Dahlia Bailiff M.D.   On: 10/22/2022 13:08    Scheduled Meds:  folic acid  1 mg Oral Daily   gabapentin  300 mg Oral TID   HYDROmorphone   Intravenous Q4H   hydroxyurea  1,500 mg Oral Daily   ketorolac  15 mg Intravenous Q6H   oxyCODONE  15 mg Oral Q12H   QUEtiapine  25 mg Oral QHS   rivaroxaban  20 mg Oral Q supper   senna-docusate  1 tablet Oral BID   voxelotor  1,000 mg Oral Daily   Continuous Infusions:  sodium chloride 100 mL/hr at 10/24/22 2300   cefTRIAXone (ROCEPHIN)  IV Stopped (10/24/22 1031)    promethazine (PHENERGAN) injection (IM or IVPB)      Principal Problem:   Sickle cell pain crisis ([place]) Active Problems:   Bipolar and related disorder ([facility])   Leukocytosis   Pyelonephritis   History of pulmonary embolism

## 2022-10-26 LAB — CBC WITH DIFFERENTIAL/PLATELET
Abs Immature Granulocytes: 0.03 10*3/uL (ref 0.00–0.07)
Basophils Absolute: 0.1 10*3/uL (ref 0.0–0.1)
Basophils Relative: 1 %
Eosinophils Absolute: 0.1 10*3/uL (ref 0.0–0.5)
Eosinophils Relative: 1 %
HCT: 22.1 % — ABNORMAL LOW (ref 36.0–46.0)
Hemoglobin: 7.6 g/dL — ABNORMAL LOW (ref 12.0–15.0)
Immature Granulocytes: 0 %
Lymphocytes Relative: 35 %
Lymphs Abs: 2.8 10*3/uL (ref 0.7–4.0)
MCH: 34.1 pg — ABNORMAL HIGH (ref 26.0–34.0)
MCHC: 34.4 g/dL (ref 30.0–36.0)
MCV: 99.1 fL (ref 80.0–100.0)
Monocytes Absolute: 1.7 10*3/uL — ABNORMAL HIGH (ref 0.1–1.0)
Monocytes Relative: 22 %
Neutro Abs: 3.2 10*3/uL (ref 1.7–7.7)
Neutrophils Relative %: 41 %
Platelets: 363 10*3/uL (ref 150–400)
RBC: 2.23 MIL/uL — ABNORMAL LOW (ref 3.87–5.11)
RDW: 22.2 % — ABNORMAL HIGH (ref 11.5–15.5)
WBC: 7.9 10*3/uL (ref 4.0–10.5)
nRBC: 0.4 % — ABNORMAL HIGH (ref 0.0–0.2)

## 2022-10-26 LAB — TYPE AND SCREEN
ABO/RH(D): A POS
Antibody Screen: NEGATIVE
Donor AG Type: NEGATIVE
Unit division: 0
Unit division: 0

## 2022-10-26 LAB — BPAM RBC
Blood Product Expiration Date: 202404052359
Blood Product Expiration Date: 202404082359
ISSUE DATE / TIME: 202403231419
ISSUE DATE / TIME: 202403241705
Unit Type and Rh: 5100
Unit Type and Rh: 6200

## 2022-10-26 MED ORDER — CEPHALEXIN 500 MG PO CAPS: 500.0000 mg | ORAL_CAPSULE | Freq: Three times a day (TID) | ORAL | 0 refills | Status: AC

## 2022-10-26 NOTE — TOC Progression Note (Signed)
Transition of Care [facility]) - Progression Note    Patient Details  Name: Sandra Mckay MRN: [id number] Date of Birth: [birth year]  Transition of Care [facility]) CM/SW [place], RN Phone [phone]  10/26/2022, 2:09 PM  Clinical Narrative:     Transition of Care [facility]) Screening Note   Patient Details  Name: Sandra Mckay Date of Birth: 20-Oct-1995   Transition of Care [facility]) CM/SW Contact:    Angelita Ingles, RN Phone Number: 10/26/2022, 2:09 PM    Transition of Care Department [facility]) has reviewed patient and no TOC needs have been identified at this time. We will continue to monitor patient advancement through interdisciplinary progression rounds. If new patient transition needs arise, please place a TOC consult.          Expected Discharge Plan and Services         Expected Discharge Date: 10/26/22                                     Social Determinants of Health (SDOH) Interventions SDOH Screenings   Food Insecurity: No Food Insecurity (10/22/2022)  Housing: Low Risk  (10/22/2022)  Transportation Needs: No Transportation Needs (10/22/2022)  Utilities: Not At Risk (10/22/2022)  Depression (PHQ2-9): High Risk (06/02/2022)  Tobacco Use: High Risk (10/22/2022)    Readmission Risk Interventions    05/31/2022   11:50 AM  Readmission Risk Prevention Plan  Transportation Screening Complete  Medication Review (RN Care Manager) Referral to Pharmacy  PCP or Specialist appointment within 3-5 days of discharge Complete  HRI or [place] Not Complete  [place] or Home Care Consult Pt Refusal Comments Not applicable  SW Recovery Care/Counseling Consult Complete  Palliative Care Screening Not [place] Not Applicable

## 2022-10-26 NOTE — Discharge Summary (Signed)
Physician Discharge Summary  Sandra Mckay [id number] DOB: 14-Jun-1996 DOA: 10/22/2022  PCP: Dorena Dew, FNP  Admit date: 10/22/2022  Discharge date: 10/26/2022  Discharge Diagnoses:  Principal Problem:   Sickle cell pain crisis ([place]) Active Problems:   Bipolar and related disorder ([place])   Leukocytosis   Pyelonephritis   History of pulmonary embolism   Discharge Condition: Stable  Disposition:   Follow-up Information     Dorena Dew, FNP Follow up in 1 week(s).   Specialty: Family Medicine Contact information: [place]. [place] [address] [phone]                Pt is discharged home in good condition and is to follow up with Dorena Dew, FNP this week to have labs evaluated. Sandra Mckay is instructed to increase activity slowly and balance with rest for the next few days, and use prescribed medication to complete treatment of pain  Diet: Regular Wt Readings from Last 3 Encounters:  10/22/22 58.2 kg  06/02/22 58.4 kg  05/26/22 59 kg    History of present illness:  Sandra Mckay  is a 27 y.o. female with a medical history significant for sickle cell disease, chronic pain syndrome, opiate dependence and tolerance, bipolar depression, history of pulmonary embolism on Xarelto and anemia of chronic disease presents to the emergency department with abdominal and low back pain over the past 4 days.  Also, patient endorses lower extremity pain that is consistent with her typical sickle cell pain crisis.  Of note, Sandra Mckay also has a history of frequent urinary tract infections.  She endorses pain with urination.  Patient has been taking home opiate medications without very much relief.  Patient notified the infusion center for admission and was advised to report to the emergency department due to reports of fever, chills, and vomiting.  Patient states that vomiting has subsided.  Patient has been afebrile in the emergency  department.  She rates her low back and lower extremity pain as 9/10.  Patient denies any dizziness, headache, shortness of breath, or chest pain.  Patient endorses nausea.  No vomiting or diarrhea at this time.  No sick contacts, recent travel, or known exposure to [place]. ER course: BP (!) 116/93   Pulse 89   Temp 99 F (37.2 C)   Resp (!) 21   Ht 5\' 2"  (1.575 m)   Wt 58.2 kg   LMP 10/15/2022 (Approximate)   SpO2 96%   BMI 23.47 kg/m  Patient remains afebrile and oxygen saturation 96% on room air.  Pertinent labs: Complete metabolic panel shows total bilirubin elevated at 4.3, otherwise unremarkable.  Complete blood count shows elevated WBCs at 19.  Patient's hemoglobin is consistent with her baseline at 8.7 g/dL, baseline is 8-9 g/dL.  Platelets elevated at 487,000.  Beta-hCG negative.  Urinalysis shows hazy appearance, small hemoglobin, rare bacteria and trace leukocytes.  Lipase negative.  CT of abdomen and pelvis shows diffuse urinary bladder wall thickening with urothelial hyperenhancement, suggestive of cystitis.  Also questionable subtle hypoenhancement of the right kidney, which may reflect early pyelonephritis, and prominence of biliary tree with the common duct measuring 16 mm and gentle tapering of the duct above the level of the ampulla, likely reflecting postcholecystectomy state.  Patient's pain persists despite IV Dilaudid, antiemetics, and IV fluids.  Patient will be admitted for pyelonephritis in the setting of sickle cell pain crisis.  Hospital Course:  Sickle cell disease with pain crisis:  Patient  was admitted for sickle cell pain crisis and managed appropriately with IVF, IV Dilaudid via PCA and IV Toradol, as well as other adjunct therapies per sickle cell pain management protocols. Pain intensity decreased throughout admission and patient was transitioned to her home medications. Today, pain intensity is 5/10 and patient says that she can manage at home on current medication  regimen. Patient will follow up with PCP in 1 week for medication management.  She will resume Xtampza and oxycodone.   Pyelonephritis:  ON admission CT of abdomen showed diffuse urinary bladder wall thickening with urothelial hyperenhancement, suggesting of cystitis.  Subtle hypoenhancement of the right kidney, which may reflect early pyelonephritis and prominence of biliary tree with common duct measurements 16 mm and gentle tapering of the duct above the level of the ampulla, likely reflecting postcholecystectomy state.  Throughout admission, patient was treated with IV antibiotics.  Urine culture yielded Klebsiella pneumonia.  Patient will continue ceftriaxone for an additional 7 days.  Anemia of chronic disease: During admission, hemoglobin decreased to 6.1 g/dL.  Patient was transfused 2 units PRBCs.  Prior to discharge, hemoglobin returned to baseline at 7.6 g/dL.  Patient will follow-up with PCP for labs including CBC with differential and CMP in 1 week.  Magan is alert, oriented, and ambulating with without assistance. Patient was therefore discharged home today in a hemodynamically stable condition.   Sandra Mckay will follow-up with PCP within 1 week of this discharge. Sandra Mckay was counseled extensively about nonpharmacologic means of pain management, patient verbalized understanding and was appreciative of  the care received during this admission.   Sandra Mckay for good hydration, monitoring of hydration status, avoidance of heat, cold, stress, and infection triggers. Sandra Mckay to be adherent with taking Hydrea and other home medications. Patient was reminded of the Mckay to seek medical attention immediately if any symptom of bleeding, anemia, or infection occurs.  Discharge Exam: Vitals:   10/26/22 1013 10/26/22 1325  BP: 116/83 115/84  Pulse: 81 78  Resp: 20 20  Temp: 98.2 F (36.8 C) 98.5 F (36.9 C)  SpO2: 100% 100%   Vitals:   10/26/22 0522 10/26/22  0557 10/26/22 1013 10/26/22 1325  BP:  114/64 116/83 115/84  Pulse:  84 81 78  Resp: 15 14 20 20   Temp:  98.7 F (37.1 C) 98.2 F (36.8 C) 98.5 F (36.9 C)  TempSrc:  Oral Oral Oral  SpO2: 95% 96% 100% 100%  Weight:      Height:        General appearance : Awake, alert, not in any distress. Speech Clear. Not toxic looking HEENT: Atraumatic and Normocephalic, pupils equally reactive to light and accomodation Neck: Supple, no JVD. No cervical lymphadenopathy.  Chest: Good air entry bilaterally, no added sounds  CVS: S1 S2 regular, no murmurs.  Abdomen: Bowel sounds present, Non tender and not distended with no gaurding, rigidity or rebound. Extremities: B/L Lower Ext shows no edema, both legs are warm to touch Neurology: Awake alert, and oriented X 3, CN II-XII intact, Non focal Skin: No Rash  Discharge Instructions  Discharge Instructions     Discharge patient   Complete by: As directed    Discharge disposition: 01-Home or Self Care   Discharge patient date: 10/26/2022      Allergies as of 10/26/2022       Reactions   Latex Rash   Wound Dressing Adhesive Rash        Medication List  TAKE these medications    albuterol 108 (90 Base) MCG/ACT inhaler Commonly known as: VENTOLIN HFA Inhale 3-4 puffs into the lungs 2 (two) times daily as needed for wheezing or shortness of breath.   Benadryl Allergy 25 mg capsule Generic drug: diphenhydrAMINE Take 50 mg by mouth every 6 (six) hours as needed for itching.   cephALEXin 500 MG capsule Commonly known as: KEFLEX Take 1 capsule (500 mg total) by mouth 3 (three) times daily for 7 days.   folic acid 1 MG tablet Commonly known as: FOLVITE Take 1 tablet (1 mg total) by mouth daily.   gabapentin 100 MG capsule Commonly known as: NEURONTIN Take 3 capsules (300 mg total) by mouth 3 (three) times daily.   hydroxyurea 500 MG capsule Commonly known as: HYDREA Take 3 capsules (1,500 mg total) by mouth daily. TAKE 3  CAPSULES (1,500 MG TOTAL) BY MOUTH DAILY.   hydrOXYzine 25 MG tablet Commonly known as: ATARAX Take 12.5 mg by mouth 2 (two) times daily as needed for itching.   Oxbryta 500 MG Tabs tablet Generic drug: voxelotor Take 1,000 mg by mouth daily.   oxyCODONE 15 MG immediate release tablet Commonly known as: ROXICODONE Take 1 tablet (15 mg total) by mouth every 6 (six) hours as needed for up to 15 days for pain.   QUEtiapine 100 MG tablet Commonly known as: SEROQUEL Take 100 mg by mouth at bedtime.   QUEtiapine 25 MG tablet Commonly known as: SEROQUEL TAKE 1 TABLET BY MOUTH EVERYDAY AT BEDTIME   rivaroxaban 20 MG Tabs tablet Commonly known as: XARELTO Take 1 tablet (20 mg total) by mouth daily with supper.   tiZANidine 4 MG tablet Commonly known as: ZANAFLEX Take 4 mg by mouth every 8 (eight) hours as needed for muscle spasms.   Xtampza ER 13.5 MG C12a Generic drug: oxyCODONE ER Take 13.5 mg by mouth every 12 (twelve) hours.        The results of significant diagnostics from this hospitalization (including imaging, microbiology, ancillary and laboratory) are listed below for reference.    Significant Diagnostic Studies: CT ABDOMEN PELVIS W CONTRAST  Result Date: 10/22/2022 CLINICAL DATA:  Flu-like symptoms, fever, flank pain, dysuria 2 days of sickle cell pain crisis. EXAM: CT ABDOMEN AND PELVIS WITH CONTRAST TECHNIQUE: Multidetector CT imaging of the abdomen and pelvis was performed using the standard protocol following bolus administration of intravenous contrast. RADIATION DOSE REDUCTION: This exam was performed according to the departmental dose-optimization program which includes automated exposure control, adjustment of the mA and/or kV according to patient size and/or use of iterative reconstruction technique. CONTRAST:  11mL OMNIPAQUE IOHEXOL 300 MG/ML  SOLN COMPARISON:  Chest CT May 26, 2022. FINDINGS: Lower chest: Scarring/atelectasis in the bilateral lung bases.  Hepatobiliary: No suspicious hepatic lesion. Gallbladder surgically absent. Prominence of the biliary tree with the common duct measuring 16 mm and gentle tapering of the duct to the level of the ampulla. Pancreas: No pancreatic ductal dilation or evidence of acute inflammation. 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 Adrenals/Urinary Tract: Bilateral adrenal glands appear normal. No hydronephrosis. Questionable subtle hypoenhancement of the right kidney. Diffuse urinary bladder wall thickening with urothelial hyperenhancement. Stomach/Bowel: Stomach is unremarkable for degree of distension. No pathologic dilation of small or large bowel. No evidence of acute bowel inflammation. Vascular/Lymphatic: Normal caliber abdominal aorta. Smooth IVC contours. No pathologically enlarged abdominal or pelvic lymph nodes. Reproductive: Dominant follicle in the right ovary measuring 2.2 cm is considered benign/physiologic and requires no independent imaging follow-up. Fluid  in the endometrial canal commonly benign related to patient's menstrual cycle. Other: Trace pelvic free fluid is within physiologic normal limits. Musculoskeletal: No acute osseous abnormality. IMPRESSION: 1. Diffuse urinary bladder wall thickening with urothelial hyperenhancement, suggestive of cystitis. 2. Questionable subtle hypoenhancement of the right kidney, which may reflect early pyelonephritis. Suggest correlation with laboratory values and CVA tenderness to palpation. 3. Prominence of the biliary tree with the common duct measuring 16 mm and gentle tapering of the duct to the level of the ampulla, likely reflecting post cholecystectomy state. Correlate with laboratory values to exclude biliary obstruction. Electronically Signed   By: Dahlia Bailiff M.D.   On: 10/22/2022 13:08    Microbiology: Recent Results (from the past 240 hour(s))  Urine Culture (for pregnant, neutropenic or urologic patients or patients with an indwelling urinary catheter)     Status:  Abnormal   Collection Time: 10/22/22 12:35 PM   Specimen: Urine, Clean Catch  Result Value Ref Range Status   Specimen Description   Final    URINE, CLEAN CATCH Performed at [facility] [address], [place], [place] [address]    Special Requests   Final    NONE Performed at [facility], [place] [address] [place], [place] [address]    Culture >=100,000 COLONIES/mL KLEBSIELLA PNEUMONIAE (A)  Final   Report Status 10/24/2022 FINAL  Final   Organism ID, Bacteria KLEBSIELLA PNEUMONIAE (A)  Final      Susceptibility   Klebsiella pneumoniae - MIC*    AMPICILLIN RESISTANT Resistant     CEFAZOLIN <=4 SENSITIVE Sensitive     CEFEPIME <=0.12 SENSITIVE Sensitive     CEFTRIAXONE <=0.25 SENSITIVE Sensitive     CIPROFLOXACIN <=0.25 SENSITIVE Sensitive     GENTAMICIN <=1 SENSITIVE Sensitive     IMIPENEM <=0.25 SENSITIVE Sensitive     NITROFURANTOIN <=16 SENSITIVE Sensitive     TRIMETH/SULFA <=20 SENSITIVE Sensitive     AMPICILLIN/SULBACTAM 4 SENSITIVE Sensitive     PIP/TAZO <=4 SENSITIVE Sensitive     * >=100,000 COLONIES/mL KLEBSIELLA PNEUMONIAE     Labs: Basic Metabolic Panel: Recent Labs  Lab 10/22/22 1128 10/23/22 0647 10/24/22 0614  NA 131* 130* 133*  K 3.6 3.4* 3.0*  CL 99 98 102  CO2 23 20* 24  GLUCOSE 107* 101* 110*  BUN 7 14 9   CREATININE 0.53 <0.30* <0.30*  CALCIUM 8.9 8.4* 8.4*   Liver Function Tests: Recent Labs  Lab 10/22/22 1128  AST 39  ALT 37  ALKPHOS 103  BILITOT 4.3*  PROT 7.5  ALBUMIN 4.8   Recent Labs  Lab 10/22/22 1128  LIPASE 22   No results for input(s): "AMMONIA" in the last 168 hours. CBC: Recent Labs  Lab 10/22/22 1128 10/23/22 0647 10/24/22 0836 10/25/22 0935 10/26/22 0858  WBC 19.0* 30.8* 21.5* 10.7* 7.9  NEUTROABS 14.0*  --  15.6*  --  3.2  HGB 8.7* 7.3* 6.1* 6.7* 7.6*  HCT 23.9* 20.5* 17.2* 19.0* 22.1*  MCV 112.7* 114.5* 108.9* 99.0 99.1  PLT 487* 399 359 361 363   Cardiac  Enzymes: No results for input(s): "CKTOTAL", "CKMB", "CKMBINDEX", "TROPONINI" in the last 168 hours. BNP: Invalid input(s): "POCBNP" CBG: No results for input(s): "GLUCAP" in the last 168 hours.  Time coordinating discharge: 30 minutes  Signed: [name]  APRN, MSN, FNP-C [facility] [address] [place], [place] [address] [phone]  [facility] 10/26/2022, 2:09 PM

## 2022-10-27 ENCOUNTER — Telehealth: Payer: Self-pay

## 2022-10-27 NOTE — Transitions of Care (Post Inpatient/ED Visit) (Signed)
   10/27/2022  Name: Sandra Mckay MRN: [id number] DOB: 1996/07/29  Today's TOC FU Call Status: Today's TOC FU Call Status:: Unsuccessul Call (1st Attempt) Unsuccessful Call (1st Attempt) Date: 10/27/22  Attempted to reach the patient regarding the most recent Inpatient/ED visit.  Follow Up Plan: Additional outreach attempts will be made to reach the patient to complete the Transitions of Care (Post Inpatient/ED visit) call.   [place] LPN [place] Advisor Direct Dial [phone]

## 2022-10-27 NOTE — Transitions of Care (Post Inpatient/ED Visit) (Signed)
   10/27/2022  Name: Jamyah Peetz MRN: [id number] DOB: [birth year]  Today's TOC FU Call Status: Today's TOC FU Call Status:: Successful TOC FU Call Competed Unsuccessful Call (1st Attempt) Date: 10/27/22 [facility] FU Call Complete Date: 10/27/22  Transition Care Management Follow-up Telephone Call Date of Discharge: 10/26/22 Discharge Facility: [facility]) Type of Discharge: Inpatient Admission Primary Inpatient Discharge Diagnosis:: Sickle cell pain crisis How have you been since you were released from the hospital?: Better Any questions or concerns?: No  Items Reviewed: Did you receive and understand the discharge instructions provided?: Yes Medications obtained and verified?: Yes (Medications Reviewed) Any new allergies since your discharge?: No Dietary orders reviewed?: Yes Do you have support at home?: Yes  Home Care and Equipment/Supplies: [place] Ordered?: No Any new equipment or medical supplies ordered?: No  Functional Questionnaire: Do you need assistance with bathing/showering or dressing?: No Do you need assistance with meal preparation?: No Do you need assistance with eating?: No Do you have difficulty maintaining continence: No Do you need assistance with getting out of bed/getting out of a chair/moving?: No Do you have difficulty managing or taking your medications?: No  Follow up appointments reviewed: PCP Follow-up appointment confirmed?: Yes Date of PCP follow-up appointment?: 11/03/22 Follow-up Provider: [name] [facility] Follow-up appointment confirmed?: No Do you need transportation to your follow-up appointment?: No Do you understand care options if your condition(s) worsen?: Yes-patient verbalized understanding    [place] LPN [place] Direct Dial [phone]

## 2022-11-02 ENCOUNTER — Telehealth: Payer: Self-pay | Admitting: Clinical

## 2022-11-02 ENCOUNTER — Telehealth: Payer: Self-pay | Admitting: Family Medicine

## 2022-11-02 ENCOUNTER — Other Ambulatory Visit: Payer: Self-pay | Admitting: Family Medicine

## 2022-11-02 DIAGNOSIS — D571 Sickle-cell disease without crisis: Secondary | ICD-10-CM

## 2022-11-02 DIAGNOSIS — G894 Chronic pain syndrome: Secondary | ICD-10-CM

## 2022-11-02 MED ORDER — OXYCODONE HCL 15 MG PO TABS: 15.0000 mg | ORAL_TABLET | ORAL | 0 refills | Status: DC | PRN

## 2022-11-02 NOTE — Progress Notes (Signed)
Reviewed PDMP substance reporting system prior to prescribing opiate medications. No inconsistencies noted.  °Meds ordered this encounter  °Medications  ° oxyCODONE (ROXICODONE) 15 MG immediate release tablet  °  Sig: Take 1 tablet (15 mg total) by mouth every 4 (four) hours as needed for pain.  °  Dispense:  60 tablet  °  Refill:  0  °  Order Specific Question:   Supervising Provider  °  Answer:   [name], [name]  ° Sandra Ford Moore Aidian Salomon  APRN, MSN, FNP-C °Patient Care Center °[facility] °[address]  °[place], [place] [address] °[phone] ° °

## 2022-11-02 NOTE — Telephone Encounter (Signed)
Caller & Relationship to patient:self MRN #  [id number]  Pt states that the last refill was routed to [place] since [place] was out of the office.   Call Back Number: [phone]  Date of Last Office Visit: 11/02/2022     Date of Next Office Visit: 11/03/2022    Medication(s) to be Refilled: Oxycodone 15mg  - short acting   [facility] [address] [facility] [place]  ** Please notify patient to allow 48-72 hours to process** **Let patient know to contact pharmacy at the end of the day to make sure medication is ready. ** **If patient has not been seen in a year or longer, book an appointment **Advise to use MyChart for refill requests OR to contact their pharmacy

## 2022-11-02 NOTE — Telephone Encounter (Signed)
Caller & Relationship to patient:  MRN #  [id number]   Call Back Number:   Date of Last Office Visit: 09/18/2022     Date of Next Office Visit: 10/05/2022    Medication(s) to be Refilled: Oxycodone   Preferred Pharmacy:   ** Please notify patient to allow 48-72 hours to process** **Let patient know to contact pharmacy at the end of the day to make sure medication is ready. ** **If patient has not been seen in a year or longer, book an appointment **Advise to use MyChart for refill requests OR to contact their pharmacy

## 2022-11-02 NOTE — Telephone Encounter (Signed)
Patient attended [facility] ([facility]) sickle cell support group via virtual format. ?  ?Nova Evett, LCSW ?[facility] ?[facility] ?[phone] ?

## 2022-11-02 NOTE — Telephone Encounter (Signed)
Patient requesting refill of oxycodone.

## 2022-11-03 ENCOUNTER — Encounter: Payer: Self-pay | Admitting: Family Medicine

## 2022-11-03 ENCOUNTER — Ambulatory Visit: Payer: Medicaid Other | Admitting: Family Medicine

## 2022-11-03 VITALS — BP 101/50 | HR 73 | Temp 98.2°F | Ht 62.0 in | Wt 138.6 lb

## 2022-11-03 DIAGNOSIS — D571 Sickle-cell disease without crisis: Secondary | ICD-10-CM

## 2022-11-03 DIAGNOSIS — R110 Nausea: Secondary | ICD-10-CM

## 2022-11-03 DIAGNOSIS — G894 Chronic pain syndrome: Secondary | ICD-10-CM | POA: Diagnosis not present

## 2022-11-03 DIAGNOSIS — E559 Vitamin D deficiency, unspecified: Secondary | ICD-10-CM | POA: Diagnosis not present

## 2022-11-03 LAB — POCT URINALYSIS DIPSTICK
Blood, UA: NEGATIVE
Glucose, UA: NEGATIVE
Ketones, UA: NEGATIVE
Leukocytes, UA: NEGATIVE
Nitrite, UA: NEGATIVE
Protein, UA: NEGATIVE
Spec Grav, UA: 1.02 (ref 1.010–1.025)
Urobilinogen, UA: 1 E.U./dL
pH, UA: 6 (ref 5.0–8.0)

## 2022-11-03 MED ORDER — ONDANSETRON HCL 4 MG PO TABS: 4.0000 mg | ORAL_TABLET | Freq: Three times a day (TID) | ORAL | 3 refills | Status: DC | PRN

## 2022-11-03 NOTE — Progress Notes (Deleted)
Physician Discharge Summary  Lisl Kennis [id number] DOB: [birth year] DOA: (Not on file)  PCP: Dorena Dew, FNP  Admit date: (Not on file)  Discharge date: 11/03/2022  Discharge Diagnoses:  Active Problems:   * No active hospital problems. *   Discharge Condition: Stable  Disposition:  Pt is discharged home in good condition and is to follow up with Dorena Dew, FNP this week to have labs evaluated. Tonantzin Assante is instructed to increase activity slowly and balance with rest for the next few days, and use prescribed medication to complete treatment of pain  Diet: Regular Wt Readings from Last 3 Encounters:  11/03/22 138 lb 9.6 oz (62.9 kg)  10/22/22 128 lb 4.9 oz (58.2 kg)  06/02/22 128 lb 12.8 oz (58.4 kg)    History of present illness:    Hospital Course:  Patient was admitted for sickle cell pain crisis and managed appropriately with IVF, IV Dilaudid via PCA and IV Toradol, as well as other adjunct therapies per sickle cell pain management protocols.  Patient was therefore discharged home today in a hemodynamically stable condition.   Caytlin will follow-up with PCP within 1 week of this discharge. Samanthajo was counseled extensively about nonpharmacologic means of pain management, patient verbalized understanding and was appreciative of  the care received during this admission.   We discussed the need for good hydration, monitoring of hydration status, avoidance of heat, cold, stress, and infection triggers. We discussed the need to be adherent with taking Hydrea and other home medications. Patient was reminded of the need to seek medical attention immediately if any symptom of bleeding, anemia, or infection occurs.  Discharge Exam: Vitals:   11/03/22 1210  BP: (!) 101/50  Pulse: 73  Temp: 98.2 F (36.8 C)  SpO2: 98%   Vitals:   11/03/22 1210  BP: (!) 101/50  Pulse: 73  Temp: 98.2 F (36.8 C)  SpO2: 98%  Weight: 138 lb 9.6 oz (62.9 kg)   Height: 5\' 2"  (1.575 m)    General appearance : Awake, alert, not in any distress. Speech Clear. Not toxic looking HEENT: Atraumatic and Normocephalic, pupils equally reactive to light and accomodation Neck: Supple, no JVD. No cervical lymphadenopathy.  Chest: Good air entry bilaterally, no added sounds  CVS: S1 S2 regular, no murmurs.  Abdomen: Bowel sounds present, Non tender and not distended with no gaurding, rigidity or rebound. Extremities: B/L Lower Ext shows no edema, both legs are warm to touch Neurology: Awake alert, and oriented X 3, CN II-XII intact, Non focal Skin: No Rash  Discharge Instructions   Allergies as of 11/03/2022       Reactions   Latex Rash   Wound Dressing Adhesive Rash        Medication List        Accurate as of November 03, 2022 12:39 PM. If you have any questions, ask your nurse or doctor.          albuterol 108 (90 Base) MCG/ACT inhaler Commonly known as: VENTOLIN HFA Inhale 3-4 puffs into the lungs 2 (two) times daily as needed for wheezing or shortness of breath.   Benadryl Allergy 25 mg capsule Generic drug: diphenhydrAMINE Take 50 mg by mouth every 6 (six) hours as needed for itching.   folic acid 1 MG tablet Commonly known as: FOLVITE Take 1 tablet (1 mg total) by mouth daily.   gabapentin 100 MG capsule Commonly known as: NEURONTIN Take 3 capsules (300 mg total) by mouth 3 (three)  times daily.   hydroxyurea 500 MG capsule Commonly known as: HYDREA Take 3 capsules (1,500 mg total) by mouth daily. TAKE 3 CAPSULES (1,500 MG TOTAL) BY MOUTH DAILY.   hydrOXYzine 25 MG tablet Commonly known as: ATARAX Take 12.5 mg by mouth 2 (two) times daily as needed for itching.   ondansetron 4 MG tablet Commonly known as: Zofran Take 1 tablet (4 mg total) by mouth every 8 (eight) hours as needed for nausea or vomiting. Started by: [name], FNP   Oxbryta 500 MG Tabs tablet Generic drug: voxelotor Take 1,000 mg by mouth daily.    oxyCODONE 15 MG immediate release tablet Commonly known as: ROXICODONE Take 1 tablet (15 mg total) by mouth every 4 (four) hours as needed for pain.   QUEtiapine 100 MG tablet Commonly known as: SEROQUEL Take 100 mg by mouth at bedtime.   QUEtiapine 25 MG tablet Commonly known as: SEROQUEL TAKE 1 TABLET BY MOUTH EVERYDAY AT BEDTIME   rivaroxaban 20 MG Tabs tablet Commonly known as: XARELTO Take 1 tablet (20 mg total) by mouth daily with supper.   tiZANidine 4 MG tablet Commonly known as: ZANAFLEX Take 4 mg by mouth every 8 (eight) hours as needed for muscle spasms.   Xtampza ER 13.5 MG C12a Generic drug: oxyCODONE ER Take 13.5 mg by mouth every 12 (twelve) hours.        The results of significant diagnostics from this hospitalization (including imaging, microbiology, ancillary and laboratory) are listed below for reference.    Significant Diagnostic Studies: CT ABDOMEN PELVIS W CONTRAST  Result Date: 10/22/2022 CLINICAL DATA:  Flu-like symptoms, fever, flank pain, dysuria 2 days of sickle cell pain crisis. EXAM: CT ABDOMEN AND PELVIS WITH CONTRAST TECHNIQUE: Multidetector CT imaging of the abdomen and pelvis was performed using the standard protocol following bolus administration of intravenous contrast. RADIATION DOSE REDUCTION: This exam was performed according to the departmental dose-optimization program which includes automated exposure control, adjustment of the mA and/or kV according to patient size and/or use of iterative reconstruction technique. CONTRAST:  168mL OMNIPAQUE IOHEXOL 300 MG/ML  SOLN COMPARISON:  Chest CT May 26, 2022. FINDINGS: Lower chest: Scarring/atelectasis in the bilateral lung bases. Hepatobiliary: No suspicious hepatic lesion. Gallbladder surgically absent. Prominence of the biliary tree with the common duct measuring 16 mm and gentle tapering of the duct to the level of the ampulla. Pancreas: No pancreatic ductal dilation or evidence of acute  inflammation. 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 Adrenals/Urinary Tract: Bilateral adrenal glands appear normal. No hydronephrosis. Questionable subtle hypoenhancement of the right kidney. Diffuse urinary bladder wall thickening with urothelial hyperenhancement. Stomach/Bowel: Stomach is unremarkable for degree of distension. No pathologic dilation of small or large bowel. No evidence of acute bowel inflammation. Vascular/Lymphatic: Normal caliber abdominal aorta. Smooth IVC contours. No pathologically enlarged abdominal or pelvic lymph nodes. Reproductive: Dominant follicle in the right ovary measuring 2.2 cm is considered benign/physiologic and requires no independent imaging follow-up. Fluid in the endometrial canal commonly benign related to patient's menstrual cycle. Other: Trace pelvic free fluid is within physiologic normal limits. Musculoskeletal: No acute osseous abnormality. IMPRESSION: 1. Diffuse urinary bladder wall thickening with urothelial hyperenhancement, suggestive of cystitis. 2. Questionable subtle hypoenhancement of the right kidney, which may reflect early pyelonephritis. Suggest correlation with laboratory values and CVA tenderness to palpation. 3. Prominence of the biliary tree with the common duct measuring 16 mm and gentle tapering of the duct to the level of the ampulla, likely reflecting post cholecystectomy state. Correlate with laboratory  values to exclude biliary obstruction. Electronically Signed   By: Dahlia Bailiff M.D.   On: 10/22/2022 13:08    Microbiology: No results found for this or any previous visit (from the past 240 hour(s)).   Labs: Basic Metabolic Panel: No results for input(s): "NA", "K", "CL", "CO2", "GLUCOSE", "BUN", "CREATININE", "CALCIUM", "MG", "PHOS" in the last 168 hours. Liver Function Tests: No results for input(s): "AST", "ALT", "ALKPHOS", "BILITOT", "PROT", "ALBUMIN" in the last 168 hours. No results for input(s): "LIPASE", "AMYLASE" in the last 168  hours. No results for input(s): "AMMONIA" in the last 168 hours. CBC: No results for input(s): "WBC", "NEUTROABS", "HGB", "HCT", "MCV", "PLT" in the last 168 hours. Cardiac Enzymes: No results for input(s): "CKTOTAL", "CKMB", "CKMBINDEX", "TROPONINI" in the last 168 hours. BNP: Invalid input(s): "POCBNP" CBG: No results for input(s): "GLUCAP" in the last 168 hours.  Time coordinating discharge: 50 minutes  Signed:   [facility] 11/03/2022, 12:39 PM

## 2022-11-03 NOTE — Progress Notes (Signed)
Established Patient Office Visit  Subjective   Patient ID: Sandra Mckay, female    DOB: [birth year]  Age: 27 y.o. MRN: [id number]  No chief complaint on file.   Sandra Mckay is a very pleasant 27 year old female with a medical history significant for sickle cell disease, chronic pain syndrome, opiate dependence and tolerance, bipolar depression, and anemia of chronic disease that presents accompanied by her daughter for follow-up of sickle cell disease and medication management.  Sandra Mckay has been doing very well since being in the hospital from 3/21-3/25/2024 for pyelonephritis in the setting of sickle cell pain crisis. Patient completed antibiotics for pyelonephritis.  During her hospital admission she was treated with IV Dilaudid, IV Toradol, and IV fluids. Patient has chronic pain primarily to low back and lower extremities.  Today she rates her pain as 5/10, consistent with her baseline.  Patient last had pain medications this a.m. with some relief.  Patient denies fever, chills, chest pain, or shortness of breath.  No urinary symptoms, nausea, vomiting, or diarrhea.    Patient Active Problem List   Diagnosis Date Noted   Pyelonephritis 10/22/2022   History of pulmonary embolism 10/22/2022   Acute deep vein thrombosis (DVT) of non-extremity vein    Alcohol abuse 05/27/2022   Acute chest syndrome    Pulmonary embolism 04/14/2022   Sickle cell pain crisis 03/26/2022   Hypokalemia 10/29/2021   Lumbar vertebral fracture 06/16/2021   Chronic anemia 06/10/2021   Influenza A 06/10/2021   Cocaine use 10/15/2020   S/P cesarean section 01/09/2019   Yeast vaginitis 12/29/2018   Umbilical vein abnormality affecting pregnancy 12/29/2018   Medication management 12/12/2018   Anxiety and depression 07/15/2018   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 07/15/2018   Supervision of high risk pregnancy in third trimester 07/15/2018    Hyperbilirubinemia 12/25/2017   Leukocytosis 12/25/2017   Menorrhagia 12/25/2017   Thrombocytosis 12/25/2017   Tobacco abuse 12/25/2017   Bipolar and related disorder 04/04/2017   Vasovagal syncope 09/18/2016   Closed fracture of lumbar vertebra without spinal cord injury 07/15/2016   Sickle cell anemia 06/08/2015   Adnexal mass 05/29/2014   Vitamin D deficiency 12/22/2011   Chronic pain 12/21/2011   Patent foramen ovale 12/21/2011   Heart murmur 10/07/2011   Pulmonary hypertension 10/07/2011   Past Medical History:  Diagnosis Date   Acute cystitis without hematuria 10/21/2019   Blood transfusion without reported diagnosis    Bronchitis    Chickenpox    Depression    Elevated ferritin level 05/2019   Heart murmur    Nausea without vomiting 09/09/2015   Pulmonary hypertension    Sickle cell anemia    Sickle cell disease, type SS    Sickle cell pain crisis 12/05/2016   Thrombocytosis 05/2019   Urinary tract infection    Vitamin D deficiency    Past Surgical History:  Procedure Laterality Date   CHOLECYSTECTOMY  2011   EYE SURGERY     Sty removal   IR IMAGING GUIDED PORT INSERTION  03/06/2022   IR REMOVAL TUN ACCESS W/ PORT W/O FL MOD SED  05/29/2022   LABIAL ADHESION LYSIS  1999   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   TONSILLECTOMY  2012   Social History   Tobacco Use   Smoking status: Some Days    Packs/day: 1    Types: Cigarettes   Smokeless tobacco: Never  Tobacco comments:    1 pack twice a week  Vaping Use   Vaping Use: Never used  Substance Use Topics   Alcohol use: Yes    Alcohol/week: 0.0 standard drinks of alcohol    Comment: occ   Drug use: Yes    Types: Marijuana    Comment: sometimes   Social History   Socioeconomic History   Marital status: Single    Spouse name: Not on file   Number of children: Not on file   Years of education: Not on file   Highest education level: Not on file  Occupational History    Not on file  Tobacco Use   Smoking status: Some Days    Packs/day: 1    Types: Cigarettes   Smokeless tobacco: Never   Tobacco comments:    1 pack twice a week  Vaping Use   Vaping Use: Never used  Substance and Sexual Activity   Alcohol use: Yes    Alcohol/week: 0.0 standard drinks of alcohol    Comment: occ   Drug use: Yes    Types: Marijuana    Comment: sometimes   Sexual activity: Yes  Other Topics Concern   Not on file  Social History Narrative   Lives with mom in a one story home.  Education: 2 years of college.    Social Determinants of Health   Financial Resource Strain: Not on file  Food Insecurity: No Food Insecurity (10/22/2022)   Hunger Vital Sign    Worried About Running Out of Food in the Last Year: Never true    Ran Out of Food in the Last Year: Never true  Transportation Needs: No Transportation Needs (10/22/2022)   PRAPARE - Administrator, Civil Service (Medical): No    Lack of Transportation (Non-Medical): No  Physical Activity: Not on file  Stress: Not on file  Social Connections: Not on file  Intimate Partner Violence: Not At Risk (10/22/2022)   Humiliation, Afraid, Rape, and Kick questionnaire    Fear of Current or Ex-Partner: No    Emotionally Abused: No    Physically Abused: No    Sexually Abused: No   Family Status  Relation Name Status   Mother  Alive   Other  (Not Specified)   Other  (Not Specified)   Family History  Problem Relation Age of Onset   Arthritis Other        grandparent   Stroke Other    Hypertension Other    Diabetes Other        grandparent   Cancer - Other Other        Glioblastoma   Allergies  Allergen Reactions   Latex Rash   Wound Dressing Adhesive Rash      ROS    Objective:     BP (!) 101/50   Pulse 73   Temp 98.2 F (36.8 C)   Ht 5\' 2"  (1.575 m)   Wt 138 lb 9.6 oz (62.9 kg)   LMP 10/15/2022 (Approximate)   SpO2 98%   BMI 25.35 kg/m  BP Readings from Last 3 Encounters:   11/03/22 (!) 101/50  10/26/22 115/84  10/01/22 (!) 104/57   Wt Readings from Last 3 Encounters:  11/03/22 138 lb 9.6 oz (62.9 kg)  10/22/22 128 lb 4.9 oz (58.2 kg)  06/02/22 128 lb 12.8 oz (58.4 kg)      Physical Exam   Results for orders placed or performed in visit on 11/03/22  Urinalysis Dipstick  Result Value Ref Range   Color, UA yellow    Clarity, UA clear    Glucose, UA Negative Negative   Bilirubin, UA small    Ketones, UA n    Spec Grav, UA 1.020 1.010 - 1.025   Blood, UA n    pH, UA 6.0 5.0 - 8.0   Protein, UA Negative Negative   Urobilinogen, UA 1.0 0.2 or 1.0 E.U./dL   Nitrite, UA n    Leukocytes, UA Negative Negative   Appearance     Odor      Last CBC Lab Results  Component Value Date   WBC 7.9 10/26/2022   HGB 7.6 (L) 10/26/2022   HCT 22.1 (L) 10/26/2022   MCV 99.1 10/26/2022   MCH 34.1 (H) 10/26/2022   RDW 22.2 (H) 10/26/2022   PLT 363 10/26/2022   Last metabolic panel Lab Results  Component Value Date   GLUCOSE 110 (H) 10/24/2022   NA 133 (L) 10/24/2022   K 3.0 (L) 10/24/2022   CL 102 10/24/2022   CO2 24 10/24/2022   BUN 9 10/24/2022   CREATININE <0.30 (L) 10/24/2022   GFRNONAA NOT CALCULATED 10/24/2022   CALCIUM 8.4 (L) 10/24/2022   PHOS 4.2 05/27/2022   PROT 7.5 10/22/2022   ALBUMIN 4.8 10/22/2022   LABGLOB 2.7 01/14/2022   AGRATIO 1.9 01/14/2022   BILITOT 4.3 (H) 10/22/2022   ALKPHOS 103 10/22/2022   AST 39 10/22/2022   ALT 37 10/22/2022   ANIONGAP 7 10/24/2022   Last lipids No results found for: "CHOL", "HDL", "LDLCALC", "LDLDIRECT", "TRIG", "CHOLHDL" Last hemoglobin A1c No results found for: "HGBA1C" Last thyroid functions No results found for: "TSH", "T3TOTAL", "T4TOTAL", "THYROIDAB" Last vitamin D Lab Results  Component Value Date   VD25OH 7.1 (L) 01/14/2022   Last vitamin B12 and Folate Lab Results  Component Value Date   FOLATE 5.5 01/14/2022      The ASCVD Risk score (Arnett DK, et al., 2019) failed to  calculate for the following reasons:   The 2019 ASCVD risk score is only valid for ages 43 to 51    Assessment & Plan:   Problem List Items Addressed This Visit       Other   Vitamin D deficiency   Relevant Orders   Sickle Cell Panel   Hb-SS disease without crisis - Primary   Relevant Orders   Sickle Cell Panel   Urinalysis Dipstick (Completed)   Chronic pain   Relevant Orders   [id number] 11+Oxyco+Alc+Crt-Bund   Other Visit Diagnoses     Nausea in adult patient       Relevant Medications   ondansetron (ZOFRAN) 4 MG tablet      1. Hb-SS disease without crisis She has been sickle cell is well-controlled on today.  Patient states that symptoms have improved since starting Oxbryta, will continue.  Will also continue hydroxyurea. - Sickle Cell Panel - Urinalysis Dipstick  2. Vitamin D deficiency  - Sickle Cell Panel  3. Chronic pain syndrome  - [id number] 11+Oxyco+Alc+Crt-Bund  4. Nausea in adult patient  - ondansetron (ZOFRAN) 4 MG tablet; Take 1 tablet (4 mg total) by mouth every 8 (eight) hours as needed for nausea or vomiting.  Dispense: 30 tablet; Refill: 3  Return in about 3 months (around 02/02/2023) for sickle cell anemia.    [name]  APRN, MSN, FNP-C Patient Care [facility] [address] [place], [place] [address] [phone]

## 2022-11-04 LAB — CMP14+CBC/D/PLT+FER+RETIC+V...
ALT: 32 IU/L (ref 0–32)
AST: 33 IU/L (ref 0–40)
Albumin/Globulin Ratio: 1.8 (ref 1.2–2.2)
Albumin: 4.4 g/dL (ref 4.0–5.0)
Alkaline Phosphatase: 118 IU/L (ref 44–121)
BUN/Creatinine Ratio: 14 (ref 9–23)
BUN: 8 mg/dL (ref 6–20)
Basophils Absolute: 0.1 10*3/uL (ref 0.0–0.2)
Basos: 1 %
Bilirubin Total: 2.3 mg/dL — ABNORMAL HIGH (ref 0.0–1.2)
CO2: 21 mmol/L (ref 20–29)
Calcium: 9.5 mg/dL (ref 8.7–10.2)
Chloride: 105 mmol/L (ref 96–106)
Creatinine, Ser: 0.58 mg/dL (ref 0.57–1.00)
EOS (ABSOLUTE): 0.1 10*3/uL (ref 0.0–0.4)
Eos: 1 %
Ferritin: 2791 ng/mL — ABNORMAL HIGH (ref 15–150)
Globulin, Total: 2.4 g/dL (ref 1.5–4.5)
Glucose: 94 mg/dL (ref 70–99)
Hematocrit: 27.9 % — ABNORMAL LOW (ref 34.0–46.6)
Hemoglobin: 9.4 g/dL — ABNORMAL LOW (ref 11.1–15.9)
Immature Grans (Abs): 0.1 10*3/uL (ref 0.0–0.1)
Immature Granulocytes: 1 %
Lymphocytes Absolute: 4.3 10*3/uL — ABNORMAL HIGH (ref 0.7–3.1)
Lymphs: 44 %
MCH: 35.5 pg — ABNORMAL HIGH (ref 26.6–33.0)
MCHC: 33.7 g/dL (ref 31.5–35.7)
MCV: 105 fL — ABNORMAL HIGH (ref 79–97)
Monocytes Absolute: 1.2 10*3/uL — ABNORMAL HIGH (ref 0.1–0.9)
Monocytes: 13 %
NRBC: 1 % — ABNORMAL HIGH (ref 0–0)
Neutrophils Absolute: 3.7 10*3/uL (ref 1.4–7.0)
Neutrophils: 40 %
Platelets: 887 10*3/uL (ref 150–450)
Potassium: 4.1 mmol/L (ref 3.5–5.2)
RBC: 2.65 x10E6/uL — CL (ref 3.77–5.28)
RDW: 20.9 % — ABNORMAL HIGH (ref 11.7–15.4)
Retic Ct Pct: 9.9 % — ABNORMAL HIGH (ref 0.6–2.6)
Sodium: 140 mmol/L (ref 134–144)
Total Protein: 6.8 g/dL (ref 6.0–8.5)
Vit D, 25-Hydroxy: 8.5 ng/mL — ABNORMAL LOW (ref 30.0–100.0)
WBC: 9.4 10*3/uL (ref 3.4–10.8)
eGFR: 126 mL/min/{1.73_m2} (ref >59)

## 2022-11-04 LAB — DRUG SCREEN 764883 11+OXYCO+ALC+CRT-BUND

## 2022-11-05 LAB — DRUG SCREEN 764883 11+OXYCO+ALC+CRT-BUND

## 2022-11-08 LAB — DRUG SCREEN 764883 11+OXYCO+ALC+CRT-BUND
Amphetamines, Urine: NEGATIVE ng/mL
BENZODIAZ UR QL: NEGATIVE ng/mL
Barbiturate: NEGATIVE ng/mL
Cocaine (Metabolite): NEGATIVE ng/mL
Creatinine: 119.9 mg/dL (ref 20.0–300.0)
Ethanol: NEGATIVE %
Meperidine: NEGATIVE ng/mL
Methadone Screen, Urine: NEGATIVE ng/mL
Phencyclidine: NEGATIVE ng/mL
Propoxyphene: NEGATIVE ng/mL
Tramadol: NEGATIVE ng/mL
pH, Urine: 5.9 (ref 4.5–8.9)

## 2022-11-08 LAB — OXYCODONE/OXYMORPHONE, CONFIRM
OXYCODONE/OXYMORPH: POSITIVE — AB
OXYCODONE: >3000 ng/mL
OXYCODONE: POSITIVE — AB
OXYMORPHONE (GC/MS): >3000 ng/mL
OXYMORPHONE: POSITIVE — AB

## 2022-11-08 LAB — OPIATES CONFIRMATION, URINE: Opiates: NEGATIVE ng/mL

## 2022-11-08 LAB — CANNABINOID CONFIRMATION, UR
CANNABINOIDS: POSITIVE — AB
Carboxy THC GC/MS Conf: >750 ng/mL

## 2022-11-09 ENCOUNTER — Ambulatory Visit (INDEPENDENT_AMBULATORY_CARE_PROVIDER_SITE_OTHER): Payer: Medicaid Other | Admitting: Clinical

## 2022-11-09 DIAGNOSIS — F419 Anxiety disorder, unspecified: Secondary | ICD-10-CM

## 2022-11-09 DIAGNOSIS — F319 Bipolar disorder, unspecified: Secondary | ICD-10-CM
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
## 2022-11-11 ENCOUNTER — Other Ambulatory Visit: Payer: Self-pay | Admitting: Family Medicine

## 2022-11-11 DIAGNOSIS — E559 Vitamin D deficiency, unspecified: Secondary | ICD-10-CM

## 2022-11-11 MED ORDER — ERGOCALCIFEROL 1.25 MG (50000 UT) PO CAPS: 50000.0000 [IU] | ORAL_CAPSULE | ORAL | 2 refills | Status: DC

## 2022-11-11 NOTE — Progress Notes (Signed)
Meds ordered this encounter  ?Medications  ? ergocalciferol (DRISDOL) 1.25 MG (50000 UT) capsule  ?  Sig: Take 1 capsule (50,000 Units total) by mouth once a week.  ?  Dispense:  12 capsule  ?  Refill:  2  ?  Order Specific Question:   Supervising Provider  ?  Answer:   [name], [name]  ? Martine Bleecker Moore Jatorian Renault  APRN, MSN, FNP-C ?Patient Care Center ?[facility] ?[address]  ?[place], [place] [address] ?[phone] ? ?

## 2022-11-11 NOTE — Progress Notes (Signed)
Inform patient that vitamin D level is decreased, which is consistent with vitamin d deficiency. Will start drisdol 50,000 IU weekly. Will recheck level in 12 weeks. Please ensure that patient has a 3 month follow up scheduled.   [name]  APRN, MSN, FNP-C Patient Care [facility] [address] [place], [place] [address] [phone]

## 2022-11-13 ENCOUNTER — Other Ambulatory Visit: Payer: Self-pay | Admitting: Family Medicine

## 2022-11-13 ENCOUNTER — Other Ambulatory Visit: Payer: Self-pay

## 2022-11-13 DIAGNOSIS — D571 Sickle-cell disease without crisis: Secondary | ICD-10-CM

## 2022-11-13 DIAGNOSIS — G894 Chronic pain syndrome: Secondary | ICD-10-CM

## 2022-11-13 MED ORDER — OXYCODONE HCL 15 MG PO TABS: 15.0000 mg | ORAL_TABLET | ORAL | 0 refills | Status: DC | PRN

## 2022-11-13 MED ORDER — XTAMPZA ER 13.5 MG PO C12A: 13.5000 mg | EXTENDED_RELEASE_CAPSULE | Freq: Two times a day (BID) | ORAL | 0 refills | Status: DC

## 2022-11-13 NOTE — Telephone Encounter (Signed)
From: [name] To: Office of Julianne Handler, [place] Sent: 11/13/2022 7:34 AM EDT Subject: Medication Renewal Request  Refills have been requested for the following medications:   oxyCODONE ER (XTAMPZA ER) 13.5 MG C12A [Lachina Hollis]   oxyCODONE (ROXICODONE) 15 MG immediate release tablet [Lachina Hollis]  Preferred pharmacy: [id number] - [place], [place] - [address] Delivery method: [employer]

## 2022-11-13 NOTE — Progress Notes (Signed)
Reviewed PDMP substance reporting system prior to prescribing opiate medications. No inconsistencies noted.  Meds ordered this encounter  Medications   oxyCODONE ER (XTAMPZA ER) 13.5 MG C12A    Sig: Take 13.5 mg by mouth every 12 (twelve) hours.    Dispense:  30 capsule    Refill:  0    Order Specific Question:   Supervising Provider    Answer:   [name] [[id number]]   oxyCODONE (ROXICODONE) 15 MG immediate release tablet    Sig: Take 1 tablet (15 mg total) by mouth every 4 (four) hours as needed for pain.    Dispense:  60 tablet    Refill:  0    Order Specific Question:   Supervising Provider    Answer:   [name] [[id number]]   [name]  APRN, MSN, FNP-C Patient Care [facility] [address] [place], [place] [address] [phone]

## 2022-11-13 NOTE — Telephone Encounter (Signed)
Please advise KH 

## 2022-11-17 ENCOUNTER — Ambulatory Visit (INDEPENDENT_AMBULATORY_CARE_PROVIDER_SITE_OTHER): Payer: Medicaid Other | Admitting: Clinical

## 2022-11-17 DIAGNOSIS — F419 Anxiety disorder, unspecified: Secondary | ICD-10-CM

## 2022-11-17 DIAGNOSIS — F319 Bipolar disorder, unspecified: Secondary | ICD-10-CM | POA: Diagnosis not present
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
## 2022-11-19 ENCOUNTER — Other Ambulatory Visit: Payer: Self-pay | Admitting: Family Medicine

## 2022-11-19 DIAGNOSIS — D571 Sickle-cell disease without crisis: Secondary | ICD-10-CM

## 2022-11-19 DIAGNOSIS — G894 Chronic pain syndrome: Secondary | ICD-10-CM

## 2022-11-19 NOTE — Progress Notes (Signed)
No orders of the defined types were placed in this encounter.

## 2022-11-24 ENCOUNTER — Ambulatory Visit (INDEPENDENT_AMBULATORY_CARE_PROVIDER_SITE_OTHER): Payer: Medicaid Other | Admitting: Clinical

## 2022-11-24 DIAGNOSIS — F319 Bipolar disorder, unspecified: Secondary | ICD-10-CM

## 2022-11-24 DIAGNOSIS — F419 Anxiety disorder, unspecified: Secondary | ICD-10-CM
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
## 2022-11-27 ENCOUNTER — Other Ambulatory Visit: Payer: Self-pay | Admitting: Family Medicine

## 2022-11-27 ENCOUNTER — Telehealth: Payer: Self-pay | Admitting: Family Medicine

## 2022-11-27 DIAGNOSIS — D571 Sickle-cell disease without crisis: Secondary | ICD-10-CM

## 2022-11-27 DIAGNOSIS — G894 Chronic pain syndrome: Secondary | ICD-10-CM

## 2022-11-27 MED ORDER — OXYCODONE HCL 15 MG PO TABS: 15.0000 mg | ORAL_TABLET | Freq: Four times a day (QID) | ORAL | 0 refills | Status: DC | PRN

## 2022-11-27 NOTE — Progress Notes (Signed)
Reviewed PDMP substance reporting system prior to prescribing opiate medications. No inconsistencies noted.  Medication cannot be filled prior to 12/02/2022.   Meds ordered this encounter  Medications   oxyCODONE (ROXICODONE) 15 MG immediate release tablet    Sig: Take 1 tablet (15 mg total) by mouth every 6 (six) hours as needed for pain.    Dispense:  60 tablet    Refill:  0    Order Specific Question:   Supervising Provider    Answer:   [name] [[id number]]      [name]  APRN, MSN, FNP-C Patient Care [facility] [address] [place], [place] [address] [phone]

## 2022-11-27 NOTE — Telephone Encounter (Signed)
Caller & Relationship to patient:   MRN #  [id number]   Call Back Number: [phone]  Date of Last Office Visit: 11/13/2022     Date of Next Office Visit: 12/04/2022    Medication(s) to be Refilled: Oxycodone 15mg   Pt knows that the med is due on 5/1  Preferred Pharmacy: [facility] [address] [place]   ** Please notify patient to allow 48-72 hours to process** **Let patient know to contact pharmacy at the end of the day to make sure medication is ready. ** **If patient has not been seen in a year or longer, book an appointment **Advise to use MyChart for refill requests OR to contact their pharmacy

## 2022-12-04 ENCOUNTER — Ambulatory Visit (INDEPENDENT_AMBULATORY_CARE_PROVIDER_SITE_OTHER): Payer: Medicaid Other | Admitting: Clinical

## 2022-12-04 DIAGNOSIS — F319 Bipolar disorder, unspecified: Secondary | ICD-10-CM | POA: Diagnosis not present

## 2022-12-04 DIAGNOSIS — F419 Anxiety disorder, unspecified: Secondary | ICD-10-CM
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
## 2022-12-09 ENCOUNTER — Non-Acute Institutional Stay (HOSPITAL_COMMUNITY)
Admission: AD | Admit: 2022-12-09 | Discharge: 2022-12-09 | Disposition: A | Discharge status: Home / Self Care | Payer: Medicaid Other | Attending: Internal Medicine | Admitting: Internal Medicine

## 2022-12-09 ENCOUNTER — Telehealth (HOSPITAL_COMMUNITY): Payer: Self-pay

## 2022-12-09 DIAGNOSIS — F1120 Opioid dependence, uncomplicated: Secondary | ICD-10-CM | POA: Insufficient documentation

## 2022-12-09 DIAGNOSIS — D5700 Hb-SS disease with crisis, unspecified: Secondary | ICD-10-CM | POA: Insufficient documentation

## 2022-12-09 DIAGNOSIS — G894 Chronic pain syndrome: Secondary | ICD-10-CM | POA: Diagnosis not present

## 2022-12-09 DIAGNOSIS — Z86711 Personal history of pulmonary embolism: Secondary | ICD-10-CM | POA: Insufficient documentation

## 2022-12-09 DIAGNOSIS — D638 Anemia in other chronic diseases classified elsewhere: Secondary | ICD-10-CM | POA: Insufficient documentation

## 2022-12-09 LAB — CBC WITH DIFFERENTIAL/PLATELET
Abs Immature Granulocytes: 0.07 10*3/uL (ref 0.00–0.07)
Basophils Absolute: 0.1 10*3/uL (ref 0.0–0.1)
Basophils Relative: 1 %
Eosinophils Absolute: 0.1 10*3/uL (ref 0.0–0.5)
Eosinophils Relative: 1 %
HCT: 22.9 % — ABNORMAL LOW (ref 36.0–46.0)
Hemoglobin: 7.7 g/dL — ABNORMAL LOW (ref 12.0–15.0)
Immature Granulocytes: 1 %
Lymphocytes Relative: 28 %
Lymphs Abs: 2.9 10*3/uL (ref 0.7–4.0)
MCH: 35.5 pg — ABNORMAL HIGH (ref 26.0–34.0)
MCHC: 33.6 g/dL (ref 30.0–36.0)
MCV: 105.5 fL — ABNORMAL HIGH (ref 80.0–100.0)
Monocytes Absolute: 1.7 10*3/uL — ABNORMAL HIGH (ref 0.1–1.0)
Monocytes Relative: 16 %
Neutro Abs: 5.7 10*3/uL (ref 1.7–7.7)
Neutrophils Relative %: 53 %
Platelets: 498 10*3/uL — ABNORMAL HIGH (ref 150–400)
RBC: 2.17 MIL/uL — ABNORMAL LOW (ref 3.87–5.11)
RDW: 23.5 % — ABNORMAL HIGH (ref 11.5–15.5)
WBC: 10.5 10*3/uL (ref 4.0–10.5)
nRBC: 1 % — ABNORMAL HIGH (ref 0.0–0.2)

## 2022-12-09 LAB — COMPREHENSIVE METABOLIC PANEL
ALT: 37 U/L (ref 0–44)
AST: 46 U/L — ABNORMAL HIGH (ref 15–41)
Albumin: 4.4 g/dL (ref 3.5–5.0)
Alkaline Phosphatase: 68 U/L (ref 38–126)
Anion gap: 8 (ref 5–15)
BUN: 9 mg/dL (ref 6–20)
CO2: 24 mmol/L (ref 22–32)
Calcium: 9.2 mg/dL (ref 8.9–10.3)
Chloride: 108 mmol/L (ref 98–111)
Creatinine, Ser: 0.48 mg/dL (ref 0.44–1.00)
GFR, Estimated: >60 mL/min (ref >60)
Glucose, Bld: 99 mg/dL (ref 70–99)
Potassium: 4 mmol/L (ref 3.5–5.1)
Sodium: 140 mmol/L (ref 135–145)
Total Bilirubin: 2.2 mg/dL — ABNORMAL HIGH (ref 0.3–1.2)
Total Protein: 7.3 g/dL (ref 6.5–8.1)

## 2022-12-09 LAB — RETICULOCYTES
Immature Retic Fract: 34.5 % — ABNORMAL HIGH (ref 2.3–15.9)
RBC.: 2.17 MIL/uL — ABNORMAL LOW (ref 3.87–5.11)
Retic Count, Absolute: 582 10*3/uL — ABNORMAL HIGH (ref 19.0–186.0)
Retic Ct Pct: 24.2 % — ABNORMAL HIGH (ref 0.4–3.1)

## 2022-12-09 LAB — PREGNANCY, URINE: Preg Test, Ur: NEGATIVE

## 2022-12-09 MED ORDER — SODIUM CHLORIDE 0.45 % IV SOLN: INTRAVENOUS | Status: DC

## 2022-12-09 MED ORDER — ONDANSETRON HCL 4 MG/2ML IJ SOLN: 4.0000 mg | Freq: Four times a day (QID) | INTRAMUSCULAR | Status: DC | PRN

## 2022-12-09 MED ORDER — DIPHENHYDRAMINE HCL 25 MG PO CAPS
25.0000 mg | ORAL_CAPSULE | ORAL | Status: DC | PRN
  Administered 2022-12-09 (×2): 25 mg via ORAL
  Filled 2022-12-09 (×2): qty 1

## 2022-12-09 MED ORDER — SODIUM CHLORIDE 0.9% FLUSH: 9.0000 mL | INTRAVENOUS | Status: DC | PRN

## 2022-12-09 MED ORDER — NALOXONE HCL 0.4 MG/ML IJ SOLN: 0.4000 mg | INTRAMUSCULAR | Status: DC | PRN

## 2022-12-09 MED ORDER — ACETAMINOPHEN 500 MG PO TABS
1000.0000 mg | ORAL_TABLET | Freq: Once | ORAL | Status: AC
  Administered 2022-12-09: 1000 mg via ORAL
  Filled 2022-12-09: qty 2

## 2022-12-09 MED ORDER — KETOROLAC TROMETHAMINE 30 MG/ML IJ SOLN
15.0000 mg | Freq: Once | INTRAMUSCULAR | Status: AC
  Administered 2022-12-09: 15 mg via INTRAVENOUS
  Filled 2022-12-09: qty 1

## 2022-12-09 MED ORDER — HYDROMORPHONE 1 MG/ML IV SOLN
INTRAVENOUS | Status: DC
  Administered 2022-12-09: 30 mg via INTRAVENOUS
  Administered 2022-12-09: 4 mg via INTRAVENOUS
  Administered 2022-12-09: 9.5 mg via INTRAVENOUS
  Filled 2022-12-09: qty 30

## 2022-12-09 NOTE — Progress Notes (Signed)
Patient admitted to the day hospital for treatment of sickle cell pain crisis. Patient reported pain rated 8/10 in the hips and knees. Patient placed on Dilaudid PCA, given PO Tylenol, PO Benadryl, IV Toradol and hydrated with IV fluids. At discharge patient reported  pain at 6/10. Declined printed AVS. Patient alert, oriented and ambulatory at discharge.

## 2022-12-09 NOTE — Discharge Summary (Signed)
[facility] Discharge Summary   Patient ID: Sandra Mckay MRN: [id number] DOB/AGE: June 22, 1996 27 y.o.  Admit date: 12/09/2022 Discharge date: 12/09/2022  Primary Care Physician:  Massie Maroon, FNP  Admission Diagnoses:  Active Problems:   Sickle cell pain crisis [facility])   Discharge Medications:  Allergies as of 12/09/2022       Reactions   Latex Rash   Wound Dressing Adhesive Rash        Medication List     TAKE these medications    albuterol 108 (90 Base) MCG/ACT inhaler Commonly known as: VENTOLIN HFA Inhale 3-4 puffs into the lungs 2 (two) times daily as needed for wheezing or shortness of breath.   Benadryl Allergy 25 mg capsule Generic drug: diphenhydrAMINE Take 50 mg by mouth every 6 (six) hours as needed for itching.   ergocalciferol 1.25 MG (50000 UT) capsule Commonly known as: Drisdol Take 1 capsule (50,000 Units total) by mouth once a week.   folic acid 1 MG tablet Commonly known as: FOLVITE Take 1 tablet (1 mg total) by mouth daily.   gabapentin 100 MG capsule Commonly known as: NEURONTIN Take 3 capsules (300 mg total) by mouth 3 (three) times daily.   hydroxyurea 500 MG capsule Commonly known as: HYDREA Take 3 capsules (1,500 mg total) by mouth daily. TAKE 3 CAPSULES (1,500 MG TOTAL) BY MOUTH DAILY.   hydrOXYzine 25 MG tablet Commonly known as: ATARAX Take 12.5 mg by mouth 2 (two) times daily as needed for itching.   ondansetron 4 MG tablet Commonly known as: Zofran Take 1 tablet (4 mg total) by mouth every 8 (eight) hours as needed for nausea or vomiting.   Oxbryta 500 MG Tabs tablet Generic drug: voxelotor Take 1,000 mg by mouth daily.   oxyCODONE 15 MG immediate release tablet Commonly known as: ROXICODONE Take 1 tablet (15 mg total) by mouth every 6 (six) hours as needed for pain.   QUEtiapine 100 MG tablet Commonly known as: SEROQUEL Take 100 mg by mouth at bedtime.   QUEtiapine 25 MG tablet Commonly known  as: SEROQUEL TAKE 1 TABLET BY MOUTH EVERYDAY AT BEDTIME   rivaroxaban 20 MG Tabs tablet Commonly known as: XARELTO Take 1 tablet (20 mg total) by mouth daily with supper.   tiZANidine 4 MG tablet Commonly known as: ZANAFLEX Take 4 mg by mouth every 8 (eight) hours as needed for muscle spasms.   Xtampza ER 13.5 MG C12a Generic drug: oxyCODONE ER Take 13.5 mg by mouth every 12 (twelve) hours.         Consults:  None  Significant Diagnostic Studies:  No results found.  History of present illness:  Sandra Mckay is a 27 year old female with a medical history significant for sickle cell disease, chronic pain syndrome, opiate dependence and tolerance, history of pulmonary embolism, and history of anemia of chronic disease that presents with complaints of generalized pain that is consistent with previous pain crisis.  The patient states the pain intensity has been elevated over the past several days and unrelieved by her home medications.  Patient last had oxycodone this a.m. without very much relief.  She has not identified any inciting factors concerning crisis.  Patient rates pain as 8/10, intermittent, and throbbing.  Denies any fever, chills, chest pain, or shortness of breath.  No nausea, vomiting, or diarrhea.  [facility] Course: Patient admitted to sickle cell day infusion clinic for management of her pain crisis. Reviewed labs, mostly consistent with her  baseline.  Hemoglobin 7.7 g/dL, slightly below patient's baseline of 8-9 g/dL. Pain managed with IV Dilaudid PCA, IV fluids, IV Toradol, Tylenol.  Pain intensity decreased throughout admission and patient will be discharged home.  Symphany is alert, oriented, and ambulating without assistance.  She will resume all home medications.  She will be discharged in a hemodynamically stable condition.  Discharge instructions:  Resume all home medications.   Follow up with PCP as previously  scheduled.   Discussed  the importance of drinking 64 ounces of water daily, dehydration of red blood cells may lead further sickling.   Avoid all stressors that precipitate sickle cell pain crisis.     The patient was given clear instructions to go to ER or return to medical center if symptoms do not improve, worsen or new problems develop.   Physical Exam at Discharge:  BP 117/63 (BP Location: Left Arm)   Pulse 64   Temp 98.6 F (37 C) (Oral)   Resp 10   LMP 12/09/2022 (Exact Date)   SpO2 97%  Physical Exam Constitutional:      Appearance: Normal appearance.  Eyes:     Pupils: Pupils are equal, round, and reactive to light.  Cardiovascular:     Rate and Rhythm: Normal rate and regular rhythm.     Pulses: Normal pulses.  Pulmonary:     Effort: Pulmonary effort is normal.  Abdominal:     General: Bowel sounds are normal.  Musculoskeletal:        General: Normal range of motion.  Skin:    General: Skin is warm.  Neurological:     General: No focal deficit present.     Mental Status: She is alert. Mental status is at baseline.  Psychiatric:        Mood and Affect: Mood normal.        Behavior: Behavior normal.        Thought Content: Thought content normal.        Judgment: Judgment normal.      Disposition at Discharge: Discharge disposition: 01-Home or Self Care       Discharge Orders: Discharge Instructions     Discharge patient   Complete by: As directed    Discharge disposition: 01-Home or Self Care   Discharge patient date: 12/09/2022       Condition at Discharge:   Stable  Time spent on Discharge:  Greater than 30 minutes.  Signed: [name]  APRN, MSN, FNP-C Patient Care [facility] [address] [place], [place] [address] [phone]  12/09/2022, 3:23 PM

## 2022-12-09 NOTE — Telephone Encounter (Signed)
Pt called day hospital wanting to come in for sickle cell pain treatment. Pt reports 8/10 pain to knees and hips. Pt reports taking Oxycodone 15 mg last night, pt reports still having pain medication at home. Pt denies being to ER recently. Pt screened for COVID and denies symptoms and exposures. Pt denies fever, chest pain, N/V/D, and abdominal pain. Provider [place] Hollis, FNP notified and patient can come to the day hospital today. Pt notified and verbalized understanding. Pt states that [employer] will be her transportation to and from clinic today.

## 2022-12-11 NOTE — H&P (Signed)
[facility] History and Physical   Date: 12/11/2022  Patient name: Sandra Mckay Medical record number: [id number] Date of birth: 04-Jan-1996 Age: 27 y.o. Gender: female PCP: Massie Maroon, FNP  Attending physician: No att. providers found  Chief Complaint: Sickle cell pain  History of present illness:  Sandra Mckay is a 27 year old female with a medical history significant for sickle cell disease, chronic pain syndrome, opiate dependence and tolerance, history of pulmonary embolism, and history of anemia of chronic disease that presents with complaints of generalized pain that is consistent with previous pain crisis.  The patient states the pain intensity has been elevated over the past several days and unrelieved by her home medications.  Patient last had oxycodone this a.m. without very much relief.  She has not identified any inciting factors concerning crisis.  Patient rates pain as 8/10, intermittent, and throbbing.  Denies any fever, chills, chest pain, or shortness of breath.  No nausea, vomiting, or diarrhea. Meds: No medications prior to admission.    Allergies: Latex and Wound dressing adhesive Past Medical History:  Diagnosis Date   Acute cystitis without hematuria 10/21/2019   Blood transfusion without reported diagnosis    Bronchitis    Chickenpox    Depression    Elevated ferritin level 05/2019   Heart murmur    Nausea without vomiting 09/09/2015   Pulmonary hypertension (HCC)    Sickle cell anemia (HCC)    Sickle cell disease, type SS (HCC)    Sickle cell pain crisis (HCC) 12/05/2016   Thrombocytosis 05/2019   Urinary tract infection    Vitamin D deficiency    Past Surgical History:  Procedure Laterality Date   CHOLECYSTECTOMY  2011   EYE SURGERY     Sty removal   IR IMAGING GUIDED PORT INSERTION  03/06/2022   IR REMOVAL TUN ACCESS W/ PORT W/O FL MOD SED  05/29/2022   LABIAL ADHESION LYSIS  1999   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[facility] for  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   TONSILLECTOMY  2012   Family History  Problem Relation Age of Onset   Arthritis Other        grandparent   Stroke Other    Hypertension Other    Diabetes Other        grandparent   Cancer - Other Other        Glioblastoma   Social History   Socioeconomic History   Marital status: Single    Spouse name: Not on file   Number of children: Not on file   Years of education: Not on file   Highest education level: Not on file  Occupational History   Not on file  Tobacco Use   Smoking status: Some Days    Packs/day: 1    Types: Cigarettes   Smokeless tobacco: Never   Tobacco comments:    1 pack twice a week  Vaping Use   Vaping Use: Never used  Substance and Sexual Activity   Alcohol use: Yes    Alcohol/week: 0.0 standard drinks of alcohol    Comment: occ   Drug use: Yes    Types: Marijuana    Comment: sometimes   Sexual activity: Yes  Other Topics Concern   Not on file  Social History Narrative   Lives with mom in a one story home.  Education: 2 years of college.    Social Determinants of Health   Financial Resource Strain: Not on file  Food  Insecurity: No Food Insecurity (10/22/2022)   Hunger Vital Sign    Worried About Running Out of Food in the Last Year: Never true    Ran Out of Food in the Last Year: Never true  Transportation Needs: No Transportation Needs (10/22/2022)   PRAPARE - Administrator, Civil Service (Medical): No    Lack of Transportation (Non-Medical): No  Physical Activity: Not on file  Stress: Not on file  Social Connections: Not on file  Intimate Partner Violence: Not At Risk (10/22/2022)   Humiliation, Afraid, Rape, and Kick questionnaire    Fear of Current or Ex-Partner: No    Emotionally Abused: No    Physically Abused: No    Sexually Abused: No   Review of Systems  Constitutional: Negative.   HENT: Negative.    Respiratory: Negative.    Cardiovascular: Negative.    Gastrointestinal: Negative.   Genitourinary: Negative.   Musculoskeletal:  Positive for back pain and joint pain.  Skin: Negative.   Neurological: Negative.   Psychiatric/Behavioral: Negative.      Physical Exam: Blood pressure 113/74, pulse 63, temperature 98.6 F (37 C), temperature source Oral, resp. rate 10, last menstrual period 12/09/2022, SpO2 100 %. Physical Exam Constitutional:      Appearance: Normal appearance.  Eyes:     Pupils: Pupils are equal, round, and reactive to light.  Cardiovascular:     Rate and Rhythm: Normal rate and regular rhythm.  Pulmonary:     Effort: Pulmonary effort is normal.  Abdominal:     General: Bowel sounds are normal.  Musculoskeletal:        General: Normal range of motion.  Skin:    General: Skin is warm.  Neurological:     General: No focal deficit present.     Mental Status: She is alert. Mental status is at baseline.  Psychiatric:        Mood and Affect: Mood normal.        Behavior: Behavior normal.        Thought Content: Thought content normal.        Judgment: Judgment normal.      Lab results: No results found for this or any previous visit (from the past 24 hour(s)).  Imaging results:  No results found.   Assessment & Plan: Patient admitted to sickle cell day infusion center for management of pain crisis.  Patient is opiate  Initiate IV dilaudid PCA.  IV fluids, 0.45% saline at 100 mL/h Toradol 15 mg IV times one dose Tylenol 1000 mg by mouth times one dose Review CBC with differential, complete metabolic panel, and reticulocytes as results become available. Baseline hemoglobin is 8-9 g/dL. Pain intensity will be reevaluated in context of functioning and relationship to baseline as care progresses If pain intensity remains elevated and/or sudden change in hemodynamic stability transition to inpatient services for higher level of care.    [name]  APRN, MSN, FNP-C Patient Care [facility] [address] [place], [place] [address] [phone]  12/11/2022, 10:12 AM

## 2022-12-14 ENCOUNTER — Ambulatory Visit (INDEPENDENT_AMBULATORY_CARE_PROVIDER_SITE_OTHER): Payer: Medicaid Other | Admitting: Clinical

## 2022-12-14 ENCOUNTER — Other Ambulatory Visit: Payer: Self-pay | Admitting: Family Medicine

## 2022-12-14 DIAGNOSIS — D571 Sickle-cell disease without crisis: Secondary | ICD-10-CM

## 2022-12-14 DIAGNOSIS — F419 Anxiety disorder, unspecified: Secondary | ICD-10-CM

## 2022-12-14 DIAGNOSIS — F319 Bipolar disorder, unspecified: Secondary | ICD-10-CM
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
## 2022-12-14 NOTE — Progress Notes (Signed)
Orders Placed This Encounter  Procedures   Ambulatory referral to Hematology / Oncology    Referral Priority:   Routine    Referral Type:   Consultation    Referral Reason:   Specialty Services Required    Requested Specialty:   Oncology    Number of Visits Requested:   1   Priscilla Finklea Rennis Petty  APRN, MSN, FNP-C Patient Care [facility] [address] [place], [place] [address] [phone]

## 2022-12-15 ENCOUNTER — Other Ambulatory Visit: Payer: Self-pay | Admitting: Family Medicine

## 2022-12-15 DIAGNOSIS — D571 Sickle-cell disease without crisis: Secondary | ICD-10-CM

## 2022-12-15 DIAGNOSIS — G894 Chronic pain syndrome: Secondary | ICD-10-CM

## 2022-12-15 MED ORDER — OXYCODONE HCL 15 MG PO TABS
15.0000 mg | ORAL_TABLET | Freq: Four times a day (QID) | ORAL | 0 refills | Status: DC | PRN
Start: 2022-12-17 — End: 2022-12-30

## 2022-12-15 MED ORDER — XTAMPZA ER 13.5 MG PO C12A
13.5000 mg | EXTENDED_RELEASE_CAPSULE | Freq: Two times a day (BID) | ORAL | 0 refills | Status: DC
Start: 2022-12-15 — End: 2023-01-11

## 2022-12-15 NOTE — Telephone Encounter (Signed)
Reviewed PDMP substance reporting system prior to prescribing opiate medications. No inconsistencies noted.  Meds ordered this encounter  Medications   oxyCODONE ER (XTAMPZA ER) 13.5 MG C12A    Sig: Take 13.5 mg by mouth every 12 (twelve) hours.    Dispense:  30 capsule    Refill:  0    Order Specific Question:   Supervising Provider    Answer:   [name] [[id number]]   oxyCODONE (ROXICODONE) 15 MG immediate release tablet    Sig: Take 1 tablet (15 mg total) by mouth every 6 (six) hours as needed for pain.    Dispense:  60 tablet    Refill:  0    Order Specific Question:   Supervising Provider    Answer:   [name] [[id number]]   [name]  APRN, MSN, FNP-C Patient Care [facility] [address] [place], [place] [address] [phone]

## 2022-12-15 NOTE — Telephone Encounter (Signed)
From: [name] To: Office of Julianne Handler, [place] Sent: 12/14/2022 3:43 PM EDT Subject: Medication Renewal Request  Refills have been requested for the following medications:   oxyCODONE ER (XTAMPZA ER) 13.5 MG C12A [Karlos Scadden]   oxyCODONE (ROXICODONE) 15 MG immediate release tablet [Anvi Mangal]  Preferred pharmacy: [id number] - [place], [place] - [address] Delivery method: [employer]

## 2022-12-30 ENCOUNTER — Other Ambulatory Visit: Payer: Self-pay | Admitting: Family Medicine

## 2022-12-30 DIAGNOSIS — D571 Sickle-cell disease without crisis: Secondary | ICD-10-CM

## 2022-12-30 DIAGNOSIS — G894 Chronic pain syndrome: Secondary | ICD-10-CM

## 2022-12-30 MED ORDER — OXYCODONE HCL 15 MG PO TABS
15.0000 mg | ORAL_TABLET | Freq: Four times a day (QID) | ORAL | 0 refills | Status: DC | PRN
Start: 2023-01-01 — End: 2023-01-13

## 2022-12-30 NOTE — Telephone Encounter (Signed)
From: [name] To: Office of Julianne Handler, [place] Sent: 12/29/2022 12:29 PM EDT Subject: Medication Renewal Request  Refills have been requested for the following medications:   oxyCODONE (ROXICODONE) 15 MG immediate release tablet [Leam Madero]  Preferred pharmacy: [id number] - [place], [place] - [address] Delivery method: Daryll Drown

## 2022-12-30 NOTE — Telephone Encounter (Signed)
Reviewed PDMP substance reporting system prior to prescribing opiate medications. No inconsistencies noted.  Meds ordered this encounter  Medications   oxyCODONE (ROXICODONE) 15 MG immediate release tablet    Sig: Take 1 tablet (15 mg total) by mouth every 6 (six) hours as needed for pain.    Dispense:  60 tablet    Refill:  0    Order Specific Question:   Supervising Provider    Answer:   [name], [name]   Sandra Mckay Sandra Sunshine Mackowski  APRN, MSN, FNP-C [facility] [address]  [place], [place] [address] [phone]  

## 2022-12-31 ENCOUNTER — Telehealth: Payer: Self-pay | Admitting: Clinical

## 2022-12-31 ENCOUNTER — Ambulatory Visit (INDEPENDENT_AMBULATORY_CARE_PROVIDER_SITE_OTHER): Payer: Medicaid Other | Admitting: Clinical

## 2022-12-31 DIAGNOSIS — F319 Bipolar disorder, unspecified: Secondary | ICD-10-CM | POA: Diagnosis not present

## 2022-12-31 DIAGNOSIS — F419 Anxiety disorder, unspecified: Secondary | ICD-10-CM

## 2022-12-31 NOTE — Telephone Encounter (Signed)
Patient requesting refill of Xtampza. She called this in before but it needed prior auth? Was this resolved?
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
## 2023-01-01 ENCOUNTER — Ambulatory Visit: Payer: Self-pay | Admitting: Nurse Practitioner

## 2023-01-05 ENCOUNTER — Encounter: Payer: Self-pay | Admitting: Family Medicine

## 2023-01-05 ENCOUNTER — Ambulatory Visit (INDEPENDENT_AMBULATORY_CARE_PROVIDER_SITE_OTHER): Payer: Medicaid Other | Admitting: Family Medicine

## 2023-01-05 VITALS — BP 93/59 | HR 86 | Temp 97.8°F | Wt 133.8 lb

## 2023-01-05 DIAGNOSIS — G894 Chronic pain syndrome: Secondary | ICD-10-CM | POA: Diagnosis not present

## 2023-01-05 DIAGNOSIS — D571 Sickle-cell disease without crisis: Secondary | ICD-10-CM | POA: Diagnosis not present

## 2023-01-05 MED ORDER — GABAPENTIN 300 MG PO CAPS
300.0000 mg | ORAL_CAPSULE | Freq: Two times a day (BID) | ORAL | 5 refills | Status: DC
Start: 2023-01-05 — End: 2023-06-09

## 2023-01-05 NOTE — Progress Notes (Signed)
Established Patient Office Visit  Subjective   Patient ID: Sandra Mckay, female    DOB: [birth year]  Age: 27 y.o. MRN: [id number]  Chief Complaint  Patient presents with   Sickle Cell Anemia    Follow up    Sandra Mckay is a very pleasant 27 year old female with a medical history significant for sickle cell disease, chronic pain syndrome, opiate dependence and tolerance, and anemia of chronic disease that presents for follow-up of her chronic conditions and medication management.  Patient states that she has been taking her pain medications consistently and has a good regimen at this time.  Her pain intensity is 5/10.  She last had medications this a.m. with moderate relief.  Patient denies any fever, chills, chest pain, shortness of breath, urinary symptoms, nausea, vomiting, or diarrhea.    Patient Active Problem List   Diagnosis Date Noted   Pyelonephritis 10/22/2022   History of pulmonary embolism 10/22/2022   Acute deep vein thrombosis (DVT) of non-extremity vein    Alcohol abuse 05/27/2022   Acute chest syndrome [facility])    Pulmonary embolism (HCC) 04/14/2022   Sickle cell pain crisis (HCC) 03/26/2022   Hypokalemia 10/29/2021   Lumbar vertebral fracture (HCC) 06/16/2021   Chronic anemia 06/10/2021   Influenza A 06/10/2021   Cocaine use 10/15/2020   S/P cesarean section 01/09/2019   Yeast vaginitis 12/29/2018   Umbilical vein abnormality affecting pregnancy 12/29/2018   Medication management 12/12/2018   Anxiety and depression 07/15/2018   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/2018   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 07/15/2018   Supervision of high risk pregnancy in third trimester 07/15/2018   Hyperbilirubinemia 12/25/2017   Leukocytosis 12/25/2017   Menorrhagia 12/25/2017   Thrombocytosis 12/25/2017   Tobacco abuse 12/25/2017   Bipolar and related disorder (HCC) 04/04/2017   Vasovagal syncope 09/18/2016   Closed fracture of lumbar  vertebra without spinal cord injury (HCC) 07/15/2016   Sickle cell anemia (HCC) 06/08/2015   Adnexal mass 05/29/2014   Vitamin D deficiency 12/22/2011   Chronic pain 12/21/2011   Patent foramen ovale 12/21/2011   Heart murmur 10/07/2011   Pulmonary hypertension (HCC) 10/07/2011   Past Medical History:  Diagnosis Date   Acute cystitis without hematuria 10/21/2019   Blood transfusion without reported diagnosis    Bronchitis    Chickenpox    Depression    Elevated ferritin level 05/2019   Heart murmur    Nausea without vomiting 09/09/2015   Pulmonary hypertension (HCC)    Sickle cell anemia (HCC)    Sickle cell disease, type SS (HCC)    Sickle cell pain crisis (HCC) 12/05/2016   Thrombocytosis 05/2019   Urinary tract infection    Vitamin D deficiency    Past Surgical History:  Procedure Laterality Date   CHOLECYSTECTOMY  2011   EYE SURGERY     Sty removal   IR IMAGING GUIDED PORT INSERTION  03/06/2022   IR REMOVAL TUN ACCESS W/ PORT W/O FL MOD SED  05/29/2022   LABIAL ADHESION LYSIS  1999   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   TONSILLECTOMY  2012   Social History   Tobacco Use   Smoking status: Some Days    Packs/day: 1    Types: Cigarettes   Smokeless tobacco: Never   Tobacco comments:    1 pack twice a week  Vaping Use   Vaping Use: Never used  Substance Use Topics   Alcohol  use: Yes    Alcohol/week: 0.0 standard drinks of alcohol    Comment: occ   Drug use: Yes    Types: Marijuana    Comment: sometimes   Social History   Socioeconomic History   Marital status: Single    Spouse name: Not on file   Number of children: Not on file   Years of education: Not on file   Highest education level: Not on file  Occupational History   Not on file  Tobacco Use   Smoking status: Some Days    Packs/day: 1    Types: Cigarettes   Smokeless tobacco: Never   Tobacco comments:    1 pack twice a week  Vaping Use   Vaping Use: Never  used  Substance and Sexual Activity   Alcohol use: Yes    Alcohol/week: 0.0 standard drinks of alcohol    Comment: occ   Drug use: Yes    Types: Marijuana    Comment: sometimes   Sexual activity: Yes  Other Topics Concern   Not on file  Social History Narrative   Lives with mom in a one story home.  Education: 2 years of college.    Social Determinants of Health   Financial Resource Strain: Not on file  Food Insecurity: No Food Insecurity (10/22/2022)   Hunger Vital Sign    Worried About Running Out of Food in the Last Year: Never true    Ran Out of Food in the Last Year: Never true  Transportation Needs: No Transportation Needs (10/22/2022)   PRAPARE - Administrator, Civil Service (Medical): No    Lack of Transportation (Non-Medical): No  Physical Activity: Not on file  Stress: Not on file  Social Connections: Not on file  Intimate Partner Violence: Not At Risk (10/22/2022)   Humiliation, Afraid, Rape, and Kick questionnaire    Fear of Current or Ex-Partner: No    Emotionally Abused: No    Physically Abused: No    Sexually Abused: No   Family Status  Relation Name Status   Mother  Alive   Other  (Not Specified)   Other  (Not Specified)   Family History  Problem Relation Age of Onset   Arthritis Other        grandparent   Stroke Other    Hypertension Other    Diabetes Other        grandparent   Cancer - Other Other        Glioblastoma   Allergies  Allergen Reactions   Latex Rash   Wound Dressing Adhesive Rash      Review of Systems  Constitutional:  Negative for chills and fever.  HENT: Negative.    Respiratory: Negative.    Cardiovascular: Negative.   Gastrointestinal: Negative.   Genitourinary: Negative.   Musculoskeletal:  Positive for back pain and joint pain.  Skin: Negative.   Neurological: Negative.   Psychiatric/Behavioral: Negative.        Objective:     BP (!) 93/59   Pulse 86   Temp 97.8 F (36.6 C)   Wt 133 lb 12.8  oz (60.7 kg)   LMP 12/09/2022 (Exact Date)   SpO2 95%   BMI 24.47 kg/m  BP Readings from Last 3 Encounters:  01/05/23 (!) 93/59  12/09/22 113/74  11/03/22 (!) 101/50   Wt Readings from Last 3 Encounters:  01/05/23 133 lb 12.8 oz (60.7 kg)  11/03/22 138 lb 9.6 oz (62.9 kg)  10/22/22 128  lb 4.9 oz (58.2 kg)      Physical Exam Constitutional:      Appearance: Normal appearance.  Eyes:     Pupils: Pupils are equal, round, and reactive to light.  Cardiovascular:     Rate and Rhythm: Normal rate and regular rhythm.  Pulmonary:     Effort: Pulmonary effort is normal.  Abdominal:     General: Bowel sounds are normal.  Musculoskeletal:        General: Normal range of motion.  Skin:    General: Skin is warm.  Neurological:     General: No focal deficit present.     Mental Status: She is alert. Mental status is at baseline.  Psychiatric:        Mood and Affect: Mood normal.        Behavior: Behavior normal.        Thought Content: Thought content normal.        Judgment: Judgment normal.      No results found for any visits on 01/05/23.  Last CBC Lab Results  Component Value Date   WBC 10.5 12/09/2022   HGB 7.7 (L) 12/09/2022   HCT 22.9 (L) 12/09/2022   MCV 105.5 (H) 12/09/2022   MCH 35.5 (H) 12/09/2022   RDW 23.5 (H) 12/09/2022   PLT 498 (H) 12/09/2022   Last metabolic panel Lab Results  Component Value Date   GLUCOSE 99 12/09/2022   NA 140 12/09/2022   K 4.0 12/09/2022   CL 108 12/09/2022   CO2 24 12/09/2022   BUN 9 12/09/2022   CREATININE 0.48 12/09/2022   GFRNONAA >60 12/09/2022   CALCIUM 9.2 12/09/2022   PHOS 4.2 05/27/2022   PROT 7.3 12/09/2022   ALBUMIN 4.4 12/09/2022   LABGLOB 2.4 11/03/2022   AGRATIO 1.8 11/03/2022   BILITOT 2.2 (H) 12/09/2022   ALKPHOS 68 12/09/2022   AST 46 (H) 12/09/2022   ALT 37 12/09/2022   ANIONGAP 8 12/09/2022   Last lipids No results found for: "CHOL", "HDL", "LDLCALC", "LDLDIRECT", "TRIG", "CHOLHDL" Last  hemoglobin A1c No results found for: "HGBA1C" Last thyroid functions No results found for: "TSH", "T3TOTAL", "T4TOTAL", "THYROIDAB" Last vitamin D Lab Results  Component Value Date   VD25OH 8.5 (L) 11/03/2022   Last vitamin B12 and Folate Lab Results  Component Value Date   FOLATE 5.5 01/14/2022      The ASCVD Risk score (Arnett DK, et al., 2019) failed to calculate for the following reasons:   The 2019 ASCVD risk score is only valid for ages 34 to 30    Assessment & Plan:   Problem List Items Addressed This Visit       Other   Hb-SS disease without crisis [facility]) - Primary   Relevant Orders   [phone] 11+Oxyco+Alc+Crt-Bund   Sickle Cell Panel   Chronic pain   Relevant Medications   gabapentin (NEURONTIN) 300 MG capsule   Other Relevant Orders   [id number] 11+Oxyco+Alc+Crt-Bund   Sickle Cell Panel   1. Hb-SS disease without crisis [facility]) Patient will continue folic acid and hydroxyurea.   - [id number] 11+Oxyco+Alc+Crt-Bund - Sickle Cell Panel; Future  2. Chronic pain syndrome Will increase gabapentin to 300 mg 2 times daily. - [id number] 11+Oxyco+Alc+Crt-Bund - gabapentin (NEURONTIN) 300 MG capsule; Take 1 capsule (300 mg total) by mouth 2 (two) times daily.  Dispense: 60 capsule; Refill: 5 - Sickle Cell Panel; Future  Return in about 3 months (around 04/07/2023) for sickle cell anemia.   [name]  APRN, MSN, FNP-C Patient Care [facility] [address] [place], [place] [address] [phone]

## 2023-01-06 LAB — DRUG SCREEN 764883 11+OXYCO+ALC+CRT-BUND

## 2023-01-08 LAB — OXYCODONE/OXYMORPHONE, CONFIRM
OXYCODONE/OXYMORPH: POSITIVE — AB
OXYCODONE: >3000 ng/mL
OXYCODONE: POSITIVE — AB
OXYMORPHONE (GC/MS): >3000 ng/mL
OXYMORPHONE: POSITIVE — AB

## 2023-01-08 LAB — DRUG SCREEN 764883 11+OXYCO+ALC+CRT-BUND
Amphetamines, Urine: NEGATIVE ng/mL
BENZODIAZ UR QL: NEGATIVE ng/mL
Cocaine (Metabolite): NEGATIVE ng/mL
Creatinine: 94.4 mg/dL (ref 20.0–300.0)
Ethanol: NEGATIVE %
Meperidine: NEGATIVE ng/mL
Phencyclidine: NEGATIVE ng/mL
Propoxyphene: NEGATIVE ng/mL
Tramadol: NEGATIVE ng/mL

## 2023-01-08 LAB — CANNABINOID CONFIRMATION, UR
CANNABINOIDS: POSITIVE — AB
Carboxy THC GC/MS Conf: 679 ng/mL

## 2023-01-08 LAB — OPIATES CONFIRMATION, URINE: Opiates: NEGATIVE ng/mL

## 2023-01-11 ENCOUNTER — Telehealth: Payer: Self-pay

## 2023-01-11 ENCOUNTER — Ambulatory Visit: Payer: Self-pay | Admitting: Clinical

## 2023-01-11 ENCOUNTER — Telehealth (HOSPITAL_COMMUNITY): Payer: Self-pay

## 2023-01-11 ENCOUNTER — Other Ambulatory Visit: Payer: Self-pay

## 2023-01-11 ENCOUNTER — Non-Acute Institutional Stay (HOSPITAL_COMMUNITY)
Admission: AD | Admit: 2023-01-11 | Discharge: 2023-01-11 | Disposition: A | Discharge status: Home / Self Care | Payer: Medicaid Other | Attending: Internal Medicine | Admitting: Internal Medicine

## 2023-01-11 DIAGNOSIS — Z79891 Long term (current) use of opiate analgesic: Secondary | ICD-10-CM | POA: Insufficient documentation

## 2023-01-11 DIAGNOSIS — D5700 Hb-SS disease with crisis, unspecified: Secondary | ICD-10-CM | POA: Diagnosis present

## 2023-01-11 DIAGNOSIS — F17210 Nicotine dependence, cigarettes, uncomplicated: Secondary | ICD-10-CM | POA: Diagnosis not present

## 2023-01-11 DIAGNOSIS — G894 Chronic pain syndrome: Secondary | ICD-10-CM | POA: Diagnosis not present

## 2023-01-11 DIAGNOSIS — F319 Bipolar disorder, unspecified: Secondary | ICD-10-CM

## 2023-01-11 DIAGNOSIS — F419 Anxiety disorder, unspecified: Secondary | ICD-10-CM

## 2023-01-11 DIAGNOSIS — D638 Anemia in other chronic diseases classified elsewhere: Secondary | ICD-10-CM | POA: Insufficient documentation

## 2023-01-11 DIAGNOSIS — D571 Sickle-cell disease without crisis: Secondary | ICD-10-CM

## 2023-01-11 LAB — TYPE AND SCREEN
ABO/RH(D): A POS
Antibody Screen: NEGATIVE

## 2023-01-11 LAB — CBC
HCT: 19.8 % — ABNORMAL LOW (ref 36.0–46.0)
Hemoglobin: 6.9 g/dL — CL (ref 12.0–15.0)
MCH: 36.9 pg — ABNORMAL HIGH (ref 26.0–34.0)
MCHC: 34.8 g/dL (ref 30.0–36.0)
MCV: 105.9 fL — ABNORMAL HIGH (ref 80.0–100.0)
Platelets: 402 10*3/uL — ABNORMAL HIGH (ref 150–400)
RBC: 1.87 MIL/uL — ABNORMAL LOW (ref 3.87–5.11)
RDW: 23.9 % — ABNORMAL HIGH (ref 11.5–15.5)
WBC: 10.3 10*3/uL (ref 4.0–10.5)
nRBC: 1.3 % — ABNORMAL HIGH (ref 0.0–0.2)

## 2023-01-11 LAB — LACTATE DEHYDROGENASE: LDH: 323 U/L — ABNORMAL HIGH (ref 98–192)

## 2023-01-11 MED ORDER — DIPHENHYDRAMINE HCL 25 MG PO CAPS
25.0000 mg | ORAL_CAPSULE | ORAL | Status: DC | PRN
  Administered 2023-01-11: 25 mg via ORAL
  Filled 2023-01-11: qty 1

## 2023-01-11 MED ORDER — SODIUM CHLORIDE 0.9% FLUSH: 9.0000 mL | INTRAVENOUS | Status: DC | PRN

## 2023-01-11 MED ORDER — NALOXONE HCL 0.4 MG/ML IJ SOLN: 0.4000 mg | INTRAMUSCULAR | Status: DC | PRN

## 2023-01-11 MED ORDER — KETOROLAC TROMETHAMINE 30 MG/ML IJ SOLN
15.0000 mg | Freq: Once | INTRAMUSCULAR | Status: AC
  Administered 2023-01-11: 15 mg via INTRAVENOUS
  Filled 2023-01-11: qty 1

## 2023-01-11 MED ORDER — HYDROMORPHONE 1 MG/ML IV SOLN
INTRAVENOUS | Status: DC
  Administered 2023-01-11: 30 mg via INTRAVENOUS
  Administered 2023-01-11: 15.5 mg via INTRAVENOUS
  Filled 2023-01-11: qty 30

## 2023-01-11 MED ORDER — ONDANSETRON HCL 4 MG/2ML IJ SOLN
4.0000 mg | Freq: Four times a day (QID) | INTRAMUSCULAR | Status: DC | PRN
  Filled 2023-01-11: qty 2

## 2023-01-11 MED ORDER — ACETAMINOPHEN 500 MG PO TABS
1000.0000 mg | ORAL_TABLET | Freq: Once | ORAL | Status: AC
  Administered 2023-01-11: 1000 mg via ORAL
  Filled 2023-01-11: qty 2

## 2023-01-11 NOTE — Progress Notes (Signed)
Patient admitted to the day infusion hospital for sickle cell pain. Initially, patient reported bilateral leg and back pain rated 8/10. For pain management, patient placed on Sickle Cell Dose Dilaudid PCA, given 15 mg IV Toradol, 1000 mg Tylenol and hydrated with IV fluids. At discharge, patient rated pain at 6/10. Vital signs stable. AVS offered but patient refused. Patient alert, oriented and ambulatory at discharge.

## 2023-01-11 NOTE — Discharge Summary (Addendum)
[facility] Discharge Summary   Patient ID: Sandra Mckay MRN: [id number] DOB/AGE: November 30, 1995 27 y.o.  Admit date: 01/11/2023 Discharge date:01/11/2023  Primary Care Physician:  Massie Maroon, FNP  Admission Diagnoses:  Active Problems:   Sickle cell pain crisis [facility])    Discharge Medications:  Allergies as of 01/11/2023       Reactions   Latex Rash   Wound Dressing Adhesive Rash        Medication List     TAKE these medications    albuterol 108 (90 Base) MCG/ACT inhaler Commonly known as: VENTOLIN HFA Inhale 3-4 puffs into the lungs 2 (two) times daily as needed for wheezing or shortness of breath.   Benadryl Allergy 25 mg capsule Generic drug: diphenhydrAMINE Take 50 mg by mouth every 6 (six) hours as needed for itching.   ergocalciferol 1.25 MG (50000 UT) capsule Commonly known as: Drisdol Take 1 capsule (50,000 Units total) by mouth once a week.   folic acid 1 MG tablet Commonly known as: FOLVITE Take 1 tablet (1 mg total) by mouth daily.   gabapentin 300 MG capsule Commonly known as: NEURONTIN Take 1 capsule (300 mg total) by mouth 2 (two) times daily.   hydroxyurea 500 MG capsule Commonly known as: HYDREA Take 3 capsules (1,500 mg total) by mouth daily. TAKE 3 CAPSULES (1,500 MG TOTAL) BY MOUTH DAILY.   hydrOXYzine 25 MG tablet Commonly known as: ATARAX Take 12.5 mg by mouth 2 (two) times daily as needed for itching.   ondansetron 4 MG tablet Commonly known as: Zofran Take 1 tablet (4 mg total) by mouth every 8 (eight) hours as needed for nausea or vomiting.   Oxbryta 500 MG Tabs tablet Generic drug: voxelotor Take 1,000 mg by mouth daily.   oxyCODONE 15 MG immediate release tablet Commonly known as: ROXICODONE Take 1 tablet (15 mg total) by mouth every 6 (six) hours as needed for pain.   QUEtiapine 100 MG tablet Commonly known as: SEROQUEL Take 100 mg by mouth at bedtime.   QUEtiapine 25 MG tablet Commonly known  as: SEROQUEL TAKE 1 TABLET BY MOUTH EVERYDAY AT BEDTIME   rivaroxaban 20 MG Tabs tablet Commonly known as: XARELTO Take 1 tablet (20 mg total) by mouth daily with supper.   tiZANidine 4 MG tablet Commonly known as: ZANAFLEX Take 4 mg by mouth every 8 (eight) hours as needed for muscle spasms.         Consults:  None  Significant Diagnostic Studies:  No results found.   History of Present Illness: Sandra Mckay is a 27 year old female with a medical history significant for sickle cell disease, chronic pain syndrome,opiate dependence and tolerance, and history of anemia of chronic disease presents with complaints of generalized pain that is consistent with previous sickle cell pain crisis. Patient was evaluated in the emergency department on yesterday. Pain intensity was controlled at discharge, but returned shortly after discharge. Patient last had oxycodone on last night without very much relief. Rashon rates her pain at 9/10, constant and throbbing. Patient has not identified any inciting factors concerning crisis. Patient denies fever, chills, chest pain, fatigue, or shortness of breath. No nausea, vomiting, or diarrhea. No sick contacts or recent travel.   [facility] Course: Patient admitted to sickle cell day infusion clinic for management of pain crisis. Reviewed all laboratory values, hemoglobin decreased at 6.9 g/dL, baseline is 7-8 g/dL.  No blood transfusion today patient will return in 1 week to  repeat labs. All other labs consistent with patient's baseline. Pain managed with IV Dilaudid PCA, IV fluids, IV Toradol, and Tylenol. Cloris's pain decreased throughout her admission and she is requesting discharge home. She is alert, oriented, and ambulating without assistance. Adilene will discharge home in a hemodynamically stable condition.  Discharge instructions: Resume all home medications.   Follow up with PCP as previously  scheduled.   Discussed  the importance of drinking 64 ounces of water daily, dehydration of red blood cells may lead further sickling.   Avoid all stressors that precipitate sickle cell pain crisis.     The patient was given clear instructions to go to ER or return to medical center if symptoms do not improve, worsen or new problems develop.    Physical Exam at Discharge:  BP (!) 112/58 (BP Location: Left Arm)   Pulse 80   Temp 98.6 F (37 C) (Temporal)   Resp 16   LMP 12/28/2022   SpO2 97%  Physical Exam Constitutional:      Appearance: Normal appearance.  Eyes:     Pupils: Pupils are equal, round, and reactive to light.  Cardiovascular:     Rate and Rhythm: Normal rate and regular rhythm.     Heart sounds: Normal heart sounds.  Pulmonary:     Effort: Pulmonary effort is normal.  Abdominal:     General: Bowel sounds are normal.  Skin:    General: Skin is warm.  Neurological:     Mental Status: She is alert. Mental status is at baseline.  Psychiatric:        Mood and Affect: Mood normal.        Behavior: Behavior normal.        Thought Content: Thought content normal.        Judgment: Judgment normal.     Disposition at Discharge: Discharge disposition: 01-Home or Self Care       Discharge Orders: Discharge Instructions     Discharge patient   Complete by: As directed    Discharge disposition: 01-Home or Self Care   Discharge patient date: 01/11/2023       Condition at Discharge:   Stable  Time spent on Discharge:  Greater than 30 minutes.  Signed: [name]  APRN, MSN, FNP-C Patient Care [facility] [address] [place], [place] [address] [phone]  01/13/2023, 3:08 PM  Evaluation and management procedures were performed by the Advanced Practitioner under my supervision and collaboration. I have reviewed the Advanced Practitioner's note and chart, and I agree with the management and plan.   Jeanann Lewandowsky, MD, MHA, CPE,  [facility] Patient [facility] [place], [place] [phone] 01/15/2023, 1:30 PM

## 2023-01-11 NOTE — H&P (Signed)
[facility] History and Physical   Date: 01/11/2023  Patient name: Sandra Mckay Medical record number: [id number] Date of birth: 08/25/1995 Age: 27 y.o. Gender: female PCP: Massie Maroon, FNP  Attending physician: [name], MD  Chief Complaint: Sickle cell pain   History of Present Illness: Sandra Mckay is a 27 year old female with a medical history significant for sickle cell disease, chronic pain syndrome,opiate dependence and tolerance, and history of anemia of chronic disease presents with complaints of generalized pain that is consistent with previous sickle cell pain crisis. Patient was evaluated in the emergency department on yesterday. Pain intensity was controlled at discharge, but returned shortly after discharge. Patient last had oxycodone on last night without very much relief. Ocie rates her pain at 9/10, constant and throbbing. Patient has not identified any inciting factors concerning crisis. Patient denies fever, chills, chest pain, fatigue, or shortness of breath. No nausea, vomiting, or diarrhea. No sick contacts or recent travel.   Meds: Medications Prior to Admission  Medication Sig Dispense Refill Last Dose   albuterol (VENTOLIN HFA) 108 (90 Base) MCG/ACT inhaler Inhale 3-4 puffs into the lungs 2 (two) times daily as needed for wheezing or shortness of breath. (Patient not taking: Reported on 01/05/2023)      diphenhydrAMINE (BENADRYL ALLERGY) 25 mg capsule Take 50 mg by mouth every 6 (six) hours as needed for itching.      ergocalciferol (DRISDOL) 1.25 MG (50000 UT) capsule Take 1 capsule (50,000 Units total) by mouth once a week. 12 capsule 2    folic acid (FOLVITE) 1 MG tablet Take 1 tablet (1 mg total) by mouth daily. 90 tablet 3    gabapentin (NEURONTIN) 300 MG capsule Take 1 capsule (300 mg total) by mouth 2 (two) times daily. 60 capsule 5    hydroxyurea (HYDREA) 500 MG capsule Take 3 capsules (1,500 mg total) by mouth daily.  TAKE 3 CAPSULES (1,500 MG TOTAL) BY MOUTH DAILY. (Patient not taking: Reported on 01/05/2023) 270 capsule 1    hydrOXYzine (ATARAX) 25 MG tablet Take 12.5 mg by mouth 2 (two) times daily as needed for itching.      ondansetron (ZOFRAN) 4 MG tablet Take 1 tablet (4 mg total) by mouth every 8 (eight) hours as needed for nausea or vomiting. 30 tablet 3    oxyCODONE (ROXICODONE) 15 MG immediate release tablet Take 1 tablet (15 mg total) by mouth every 6 (six) hours as needed for pain. 60 tablet 0    oxyCODONE ER (XTAMPZA ER) 13.5 MG C12A Take 13.5 mg by mouth every 12 (twelve) hours. 30 capsule 0    QUEtiapine (SEROQUEL) 100 MG tablet Take 100 mg by mouth at bedtime.      QUEtiapine (SEROQUEL) 25 MG tablet TAKE 1 TABLET BY MOUTH EVERYDAY AT BEDTIME (Patient not taking: Reported on 01/05/2023) 90 tablet 3    rivaroxaban (XARELTO) 20 MG TABS tablet Take 1 tablet (20 mg total) by mouth daily with supper. 30 tablet 3    tiZANidine (ZANAFLEX) 4 MG tablet Take 4 mg by mouth every 8 (eight) hours as needed for muscle spasms.      voxelotor (OXBRYTA) 500 MG TABS tablet Take 1,000 mg by mouth daily. 90 tablet 2     Allergies: Latex and Wound dressing adhesive Past Medical History:  Diagnosis Date   Acute cystitis without hematuria 10/21/2019   Blood transfusion without reported diagnosis    Bronchitis    Chickenpox    Depression  Elevated ferritin level 05/2019   Heart murmur    Nausea without vomiting 09/09/2015   Pulmonary hypertension (HCC)    Sickle cell anemia (HCC)    Sickle cell disease, type SS (HCC)    Sickle cell pain crisis (HCC) 12/05/2016   Thrombocytosis 05/2019   Urinary tract infection    Vitamin D deficiency    Past Surgical History:  Procedure Laterality Date   CHOLECYSTECTOMY  2011   EYE SURGERY     Sty removal   IR IMAGING GUIDED PORT INSERTION  03/06/2022   IR REMOVAL TUN ACCESS W/ PORT W/O FL MOD SED  05/29/2022   LABIAL ADHESION LYSIS  1999   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[facility] for  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   TONSILLECTOMY  2012   Family History  Problem Relation Age of Onset   Arthritis Other        grandparent   Stroke Other    Hypertension Other    Diabetes Other        grandparent   Cancer - Other Other        Glioblastoma   Social History   Socioeconomic History   Marital status: Single    Spouse name: Not on file   Number of children: Not on file   Years of education: Not on file   Highest education level: Not on file  Occupational History   Not on file  Tobacco Use   Smoking status: Some Days    Packs/day: 1    Types: Cigarettes   Smokeless tobacco: Never   Tobacco comments:    1 pack twice a week  Vaping Use   Vaping Use: Never used  Substance and Sexual Activity   Alcohol use: Yes    Alcohol/week: 0.0 standard drinks of alcohol    Comment: occ   Drug use: Yes    Types: Marijuana    Comment: sometimes   Sexual activity: Yes  Other Topics Concern   Not on file  Social History Narrative   Lives with mom in a one story home.  Education: 2 years of college.    Social Determinants of Health   Financial Resource Strain: Not on file  Food Insecurity: No Food Insecurity (10/22/2022)   Hunger Vital Sign    Worried About Running Out of Food in the Last Year: Never true    Ran Out of Food in the Last Year: Never true  Transportation Needs: No Transportation Needs (10/22/2022)   PRAPARE - Administrator, Civil Service (Medical): No    Lack of Transportation (Non-Medical): No  Physical Activity: Not on file  Stress: Not on file  Social Connections: Not on file  Intimate Partner Violence: Not At Risk (10/22/2022)   Humiliation, Afraid, Rape, and Kick questionnaire    Fear of Current or Ex-Partner: No    Emotionally Abused: No    Physically Abused: No    Sexually Abused: No   Review of Systems  Constitutional: Negative.   HENT: Negative.    Eyes: Negative.   Respiratory: Negative.    Cardiovascular: Negative.    Gastrointestinal: Negative.   Genitourinary: Negative.   Musculoskeletal:  Positive for back pain and joint pain.  Skin: Negative.   Neurological: Negative.   Endo/Heme/Allergies: Negative.   Psychiatric/Behavioral: Negative.      Physical Exam: There were no vitals taken for this visit. Physical Exam Constitutional:      Appearance: Normal appearance.  Eyes:  Pupils: Pupils are equal, round, and reactive to light.  Cardiovascular:     Rate and Rhythm: Normal rate and regular rhythm.  Abdominal:     General: Abdomen is flat. Bowel sounds are normal.  Musculoskeletal:        General: Normal range of motion.  Neurological:     General: No focal deficit present.     Mental Status: She is alert.  Psychiatric:        Mood and Affect: Mood normal.        Thought Content: Thought content normal.        Judgment: Judgment normal.      Lab results: No results found for this or any previous visit (from the past 24 hour(s)).  Imaging results:  No results found.   Assessment & Plan: Patient admitted to sickle cell day infusion center for management of pain crisis.  Patient is opiate tolerant Initiate IV dilaudid PCA.  IV fluids, 0.45% saline at 100 ml/hr Toradol 15 mg IV times one dose Tylenol 1000 mg by mouth times one dose Review CBC with differential, complete metabolic panel, and reticulocytes as results become available.  Pain intensity will be reevaluated in context of functioning and relationship to baseline as care progresses If pain intensity remains elevated and/or sudden change in hemodynamic stability transition to inpatient services for higher level of care.   [name]  APRN, MSN, FNP-C Patient Care [facility] [address] [place], [place] [address] [phone]  01/11/2023, 9:02 AM

## 2023-01-11 NOTE — Telephone Encounter (Signed)
A prior authorization request for Sandra Mckay has been submitted to patient's insurance for expedited review via CoverMyMeds today Key: [id number]

## 2023-01-11 NOTE — Telephone Encounter (Signed)
Pt called day hospital wanting to come in today for sickle cell pain treatment. Pt reports 9/10 pain to bilateral hips and knees. Pt reports taking Oxycodone yesterday, and is out of medication. Pt reports that insurance did not cover her medication cost, so she was unable to pick it up. Pt reports being in ER last night. Pt denies fever, chest pain, N/V/D, and abdominal pain. [place] Hollis, FNP notified and she said pt can come to day hospital today for treatment. Pt notified and verbalized understanding. Pt states that her mom will be her transportation today.
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
## 2023-01-11 NOTE — Telephone Encounter (Signed)
Pharmacy requested a alternative  to what is listed. Please advise [facility]

## 2023-01-12 ENCOUNTER — Other Ambulatory Visit: Payer: Self-pay

## 2023-01-12 MED ORDER — XTAMPZA ER 13.5 MG PO C12A
13.5000 mg | EXTENDED_RELEASE_CAPSULE | Freq: Two times a day (BID) | ORAL | 0 refills | Status: AC
Start: 2023-01-12 — End: 2023-01-19

## 2023-01-12 NOTE — Telephone Encounter (Signed)
Approved until 04/13/2023. [facility] was able to sucessfully process the prescription through insurance.  Patient has been notified.

## 2023-01-13 ENCOUNTER — Encounter (HOSPITAL_COMMUNITY): Payer: Self-pay

## 2023-01-13 ENCOUNTER — Inpatient Hospital Stay (HOSPITAL_COMMUNITY)
Admission: EM | Admit: 2023-01-13 | Discharge: 2023-01-16 | DRG: 812 | Disposition: A | Discharge status: Home / Self Care | Payer: Medicaid Other | Attending: Internal Medicine | Admitting: Internal Medicine

## 2023-01-13 ENCOUNTER — Other Ambulatory Visit: Payer: Self-pay | Admitting: Family Medicine

## 2023-01-13 ENCOUNTER — Other Ambulatory Visit: Payer: Self-pay

## 2023-01-13 DIAGNOSIS — Z833 Family history of diabetes mellitus: Secondary | ICD-10-CM

## 2023-01-13 DIAGNOSIS — Z823 Family history of stroke: Secondary | ICD-10-CM

## 2023-01-13 DIAGNOSIS — I272 Pulmonary hypertension, unspecified: Secondary | ICD-10-CM | POA: Diagnosis present

## 2023-01-13 DIAGNOSIS — Z7901 Long term (current) use of anticoagulants: Secondary | ICD-10-CM

## 2023-01-13 DIAGNOSIS — G894 Chronic pain syndrome: Secondary | ICD-10-CM | POA: Diagnosis present

## 2023-01-13 DIAGNOSIS — F32A Depression, unspecified: Secondary | ICD-10-CM | POA: Diagnosis present

## 2023-01-13 DIAGNOSIS — D72829 Elevated white blood cell count, unspecified: Secondary | ICD-10-CM | POA: Diagnosis present

## 2023-01-13 DIAGNOSIS — Z79899 Other long term (current) drug therapy: Secondary | ICD-10-CM

## 2023-01-13 DIAGNOSIS — D5700 Hb-SS disease with crisis, unspecified: Principal | ICD-10-CM | POA: Diagnosis present

## 2023-01-13 DIAGNOSIS — F17210 Nicotine dependence, cigarettes, uncomplicated: Secondary | ICD-10-CM | POA: Diagnosis present

## 2023-01-13 DIAGNOSIS — F419 Anxiety disorder, unspecified: Secondary | ICD-10-CM | POA: Diagnosis present

## 2023-01-13 DIAGNOSIS — Z9049 Acquired absence of other specified parts of digestive tract: Secondary | ICD-10-CM

## 2023-01-13 DIAGNOSIS — F319 Bipolar disorder, unspecified: Secondary | ICD-10-CM | POA: Diagnosis present

## 2023-01-13 DIAGNOSIS — Z8261 Family history of arthritis: Secondary | ICD-10-CM

## 2023-01-13 DIAGNOSIS — Z86711 Personal history of pulmonary embolism: Secondary | ICD-10-CM | POA: Diagnosis present

## 2023-01-13 DIAGNOSIS — Z9081 Acquired absence of spleen: Secondary | ICD-10-CM

## 2023-01-13 DIAGNOSIS — Z808 Family history of malignant neoplasm of other organs or systems: Secondary | ICD-10-CM

## 2023-01-13 DIAGNOSIS — D571 Sickle-cell disease without crisis: Secondary | ICD-10-CM

## 2023-01-13 DIAGNOSIS — Z91040 Latex allergy status: Secondary | ICD-10-CM

## 2023-01-13 DIAGNOSIS — G8929 Other chronic pain: Secondary | ICD-10-CM | POA: Diagnosis present

## 2023-01-13 DIAGNOSIS — Z8249 Family history of ischemic heart disease and other diseases of the circulatory system: Secondary | ICD-10-CM

## 2023-01-13 DIAGNOSIS — F1120 Opioid dependence, uncomplicated: Secondary | ICD-10-CM | POA: Diagnosis present

## 2023-01-13 LAB — CBC WITH DIFFERENTIAL/PLATELET
Abs Immature Granulocytes: 0.05 10*3/uL (ref 0.00–0.07)
Basophils Absolute: 0.1 10*3/uL (ref 0.0–0.1)
Basophils Relative: 1 %
Eosinophils Absolute: 0.1 10*3/uL (ref 0.0–0.5)
Eosinophils Relative: 1 %
HCT: 21.7 % — ABNORMAL LOW (ref 36.0–46.0)
Hemoglobin: 7.5 g/dL — ABNORMAL LOW (ref 12.0–15.0)
Immature Granulocytes: 0 %
Lymphocytes Relative: 49 %
Lymphs Abs: 5.9 10*3/uL — ABNORMAL HIGH (ref 0.7–4.0)
MCH: 36.9 pg — ABNORMAL HIGH (ref 26.0–34.0)
MCHC: 34.6 g/dL (ref 30.0–36.0)
MCV: 106.9 fL — ABNORMAL HIGH (ref 80.0–100.0)
Monocytes Absolute: 1.6 10*3/uL — ABNORMAL HIGH (ref 0.1–1.0)
Monocytes Relative: 14 %
Neutro Abs: 4.2 10*3/uL (ref 1.7–7.7)
Neutrophils Relative %: 35 %
Platelets: 428 10*3/uL — ABNORMAL HIGH (ref 150–400)
RBC: 2.03 MIL/uL — ABNORMAL LOW (ref 3.87–5.11)
RDW: 24.7 % — ABNORMAL HIGH (ref 11.5–15.5)
WBC: 12 10*3/uL — ABNORMAL HIGH (ref 4.0–10.5)
nRBC: 1.6 % — ABNORMAL HIGH (ref 0.0–0.2)

## 2023-01-13 LAB — COMPREHENSIVE METABOLIC PANEL
ALT: 35 U/L (ref 0–44)
AST: 35 U/L (ref 15–41)
Albumin: 4.3 g/dL (ref 3.5–5.0)
Alkaline Phosphatase: 66 U/L (ref 38–126)
Anion gap: 6 (ref 5–15)
BUN: 9 mg/dL (ref 6–20)
CO2: 25 mmol/L (ref 22–32)
Calcium: 9 mg/dL (ref 8.9–10.3)
Chloride: 106 mmol/L (ref 98–111)
Creatinine, Ser: 0.67 mg/dL (ref 0.44–1.00)
GFR, Estimated: >60 mL/min (ref >60)
Glucose, Bld: 113 mg/dL — ABNORMAL HIGH (ref 70–99)
Potassium: 3.8 mmol/L (ref 3.5–5.1)
Sodium: 137 mmol/L (ref 135–145)
Total Bilirubin: 2.1 mg/dL — ABNORMAL HIGH (ref 0.3–1.2)
Total Protein: 7.3 g/dL (ref 6.5–8.1)

## 2023-01-13 LAB — I-STAT BETA HCG BLOOD, ED (MC, WL, AP ONLY): I-stat hCG, quantitative: <5 m[IU]/mL (ref <5)

## 2023-01-13 LAB — RETICULOCYTES
Immature Retic Fract: 30.5 % — ABNORMAL HIGH (ref 2.3–15.9)
RBC.: 2 MIL/uL — ABNORMAL LOW (ref 3.87–5.11)
Retic Count, Absolute: 555.5 10*3/uL — ABNORMAL HIGH (ref 19.0–186.0)
Retic Ct Pct: 28.5 % — ABNORMAL HIGH (ref 0.4–3.1)

## 2023-01-13 MED ORDER — SODIUM CHLORIDE 0.9 % IV SOLN
12.5000 mg | Freq: Once | INTRAVENOUS | Status: AC
  Administered 2023-01-13: 12.5 mg via INTRAVENOUS
  Filled 2023-01-13: qty 0.25
  Filled 2023-01-13: qty 12.5

## 2023-01-13 MED ORDER — HYDROMORPHONE HCL 2 MG/ML IJ SOLN
2.0000 mg | INTRAMUSCULAR | Status: AC
  Administered 2023-01-13: 2 mg via INTRAVENOUS
  Filled 2023-01-13: qty 1

## 2023-01-13 MED ORDER — OXYCODONE HCL 15 MG PO TABS
15.0000 mg | ORAL_TABLET | Freq: Four times a day (QID) | ORAL | 0 refills | Status: DC | PRN
Start: 2023-01-16 — End: 2023-01-27

## 2023-01-13 MED ORDER — KETOROLAC TROMETHAMINE 15 MG/ML IJ SOLN
15.0000 mg | INTRAMUSCULAR | Status: AC
  Administered 2023-01-13: 15 mg via INTRAVENOUS
  Filled 2023-01-13: qty 1

## 2023-01-13 MED ORDER — DEXTROSE-SODIUM CHLORIDE 5-0.45 % IV SOLN: INTRAVENOUS | Status: DC

## 2023-01-13 MED ORDER — ONDANSETRON HCL 4 MG/2ML IJ SOLN
4.0000 mg | INTRAMUSCULAR | Status: DC | PRN
  Administered 2023-01-13: 4 mg via INTRAVENOUS
  Filled 2023-01-13: qty 2

## 2023-01-13 NOTE — ED Triage Notes (Signed)
Pt states she has been in a sickle cell flare up for a week. Pain in both of her hips. States she has been in and out of the hospital all week. Took home oxycodone and xtampza PTA with little relief. Reports decreased energy and fatigue.

## 2023-01-13 NOTE — ED Provider Notes (Signed)
[place] EMERGENCY DEPARTMENT AT [facility] Provider Note   CSN: [id number] Arrival date & time: 01/13/23  1958     History  Chief Complaint  Patient presents with   Sickle Cell Pain Crisis    Sandra Mckay is a 27 y.o. female.  Patient is a 27 year old female with a history of sickle cell anemia with typical hemoglobin in the range of 7, prior acute chest syndrome, pulmonary hypertension and PE currently on Xarelto who is presenting today with complaint of back and leg pain as well as some pain at the base of her head.  She reports this week she has been having issues with pain crisis in these areas.  She has been to [facility] once and also at the sickle cell day clinic and reports her pain will get a little bit better but has still been persistent.  Today she has tried her xtampza and oxycodone but pain is still not controlled.  She has not had fever, nausea vomiting, changes in urine or bowel habits.  She denies any numbness or tingling going down her leg or joint swelling.  No chest pain or shortness of breath.  The history is provided by the patient.  Sickle Cell Pain Crisis      Home Medications Prior to Admission medications   Medication Sig Start Date End Date Taking? Authorizing Provider  doxepin (SINEQUAN) 25 MG capsule Take 25 mg by mouth at bedtime. 11/23/22  Yes [provider]  albuterol (VENTOLIN HFA) 108 (90 Base) MCG/ACT inhaler Inhale 3-4 puffs into the lungs 2 (two) times daily as needed for wheezing or shortness of breath. Patient not taking: Reported on 01/05/2023    [provider]  diphenhydrAMINE (BENADRYL ALLERGY) 25 mg capsule Take 50 mg by mouth every 6 (six) hours as needed for itching.    [provider]  ergocalciferol (DRISDOL) 1.25 MG (50000 UT) capsule Take 1 capsule (50,000 Units total) by mouth once a week. 11/11/22   Massie Maroon, FNP  folic acid (FOLVITE) 1 MG tablet Take 1 tablet (1 mg total) by mouth  daily. 04/20/22   [name], MD  gabapentin (NEURONTIN) 300 MG capsule Take 1 capsule (300 mg total) by mouth 2 (two) times daily. 01/05/23   Massie Maroon, FNP  hydroxyurea (HYDREA) 500 MG capsule Take 3 capsules (1,500 mg total) by mouth daily. TAKE 3 CAPSULES (1,500 MG TOTAL) BY MOUTH DAILY. 04/20/22   [name], MD  hydrOXYzine (ATARAX) 25 MG tablet Take 12.5 mg by mouth 2 (two) times daily as needed for itching.    [provider]  ondansetron (ZOFRAN) 4 MG tablet Take 1 tablet (4 mg total) by mouth every 8 (eight) hours as needed for nausea or vomiting. 11/03/22   Massie Maroon, FNP  oxyCODONE (ROXICODONE) 15 MG immediate release tablet Take 1 tablet (15 mg total) by mouth every 6 (six) hours as needed for pain. 01/16/23   Massie Maroon, FNP  oxyCODONE ER [facility] ER) 13.5 MG C12A Take 13.5 mg by mouth every 12 (twelve) hours for 7 days. 01/12/23 01/19/23  Massie Maroon, FNP  QUEtiapine (SEROQUEL) 100 MG tablet Take 100 mg by mouth at bedtime.    [provider]  QUEtiapine (SEROQUEL) 25 MG tablet TAKE 1 TABLET BY MOUTH EVERYDAY AT BEDTIME Patient not taking: Reported on 01/05/2023 06/03/22   Massie Maroon, FNP  rivaroxaban (XARELTO) 20 MG TABS tablet Take 1 tablet (20 mg total) by mouth  daily with supper. 05/31/22   [name], MD  tiZANidine (ZANAFLEX) 4 MG tablet Take 4 mg by mouth every 8 (eight) hours as needed for muscle spasms.    [provider]  voxelotor (OXBRYTA) 500 MG TABS tablet Take 1,000 mg by mouth daily. 09/14/22   Massie Maroon, FNP      Allergies    Latex and Wound dressing adhesive    Review of Systems   Review of Systems  Physical Exam Updated Vital Signs BP 112/71   Pulse 79   Temp 98.8 F (37.1 C) (Oral)   Resp 16   Ht 5\' 2"  (1.575 m)   Wt 59 kg   LMP 12/28/2022   SpO2 95%   BMI 23.78 kg/m  Physical Exam Vitals and nursing note reviewed.  Constitutional:      General: She is not in  acute distress.    Appearance: She is well-developed.  HENT:     Head: Normocephalic and atraumatic.  Eyes:     Pupils: Pupils are equal, round, and reactive to light.  Cardiovascular:     Rate and Rhythm: Normal rate and regular rhythm.     Heart sounds: Normal heart sounds. No murmur heard.    No friction rub.  Pulmonary:     Effort: Pulmonary effort is normal.     Breath sounds: Normal breath sounds. No wheezing or rales.  Abdominal:     General: Bowel sounds are normal. There is no distension.     Palpations: Abdomen is soft.     Tenderness: There is no abdominal tenderness. There is no guarding or rebound.  Musculoskeletal:        General: Tenderness present. Normal range of motion.       Back:     Comments: No edema  Skin:    General: Skin is warm and dry.     Coloration: Skin is pale.     Findings: No rash.  Neurological:     Mental Status: She is alert and oriented to person, place, and time.     Cranial Nerves: No cranial nerve deficit.  Psychiatric:        Behavior: Behavior normal.     ED Results / Procedures / Treatments   Labs (all labs ordered are listed, but only abnormal results are displayed) Labs Reviewed  COMPREHENSIVE METABOLIC PANEL - Abnormal; Notable for the following components:      Result Value   Glucose, Bld 113 (*)    Total Bilirubin 2.1 (*)    All other components within normal limits  CBC WITH DIFFERENTIAL/PLATELET - Abnormal; Notable for the following components:   WBC 12.0 (*)    RBC 2.03 (*)    Hemoglobin 7.5 (*)    HCT 21.7 (*)    MCV 106.9 (*)    MCH 36.9 (*)    RDW 24.7 (*)    Platelets 428 (*)    nRBC 1.6 (*)    Lymphs Abs 5.9 (*)    Monocytes Absolute 1.6 (*)    All other components within normal limits  RETICULOCYTES - Abnormal; Notable for the following components:   Retic Ct Pct 28.5 (*)    RBC. 2.00 (*)    Retic Count, Absolute 555.5 (*)    Immature Retic Fract 30.5 (*)    All other components within normal limits   I-STAT BETA HCG BLOOD, ED ([facility], [facility], [facility] ONLY)    EKG None  Radiology No results found.  Procedures Procedures  Medications Ordered in ED Medications  dextrose 5 % and 0.45 % NaCl infusion ( Intravenous New Bag/Given 01/13/23 2231)  ondansetron (ZOFRAN) injection 4 mg (4 mg Intravenous Given 01/13/23 2310)  HYDROmorphone (DILAUDID) injection 2 mg (2 mg Intravenous Given 01/13/23 2229)  HYDROmorphone (DILAUDID) injection 2 mg (2 mg Intravenous Given 01/13/23 2301)  HYDROmorphone (DILAUDID) injection 2 mg (2 mg Intravenous Given 01/13/23 2334)  ketorolac (TORADOL) 15 MG/ML injection 15 mg (15 mg Intravenous Given 01/13/23 2230)  diphenhydrAMINE (BENADRYL) 12.5 mg in sodium chloride 0.9 % 50 mL IVPB (0 mg Intravenous Stopped 01/13/23 2344)    ED Course/ Medical Decision Making/ A&P                             Medical Decision Making Amount and/or Complexity of Data Reviewed External Data Reviewed: notes.    Details: Sickle cell clinic Labs: ordered. Decision-making details documented in ED Course.  Risk Prescription drug management. Parenteral controlled substances.   Pt with multiple medical problems and comorbidities and presenting today with a complaint that caries a high risk for morbidity and mortality.  Here today with complaint of pain in the setting of having sickle cell disease and concern for pain crisis.  Patient is not having symptoms today suggestive of infectious etiology or acute chest.  No joint swelling.  Vital signs are reassuring.  I independently interpreted patient's labs.  CBC today with stable hemoglobin of 7.5 which appears to be patient's baseline, elevated reticulocyte count of 28 and mild leukocytosis of 12.  Lab findings are consistent with pain crisis.  Patient started on pain management with IV Dilaudid.  After 2 doses her pain was a 7 out of 10 she just received her third dose and patient checked out to Dr. Bebe Shaggy for repeat evaluation to determine if  patient requires admission for pain control.         Final Clinical Impression(s) / ED Diagnoses Final diagnoses:  Sickle cell pain crisis [facility])    Rx / DC Orders ED Discharge Orders     None         Gwyneth Sprout, MD 01/13/23 2358

## 2023-01-13 NOTE — Progress Notes (Signed)
Reviewed PDMP substance reporting system prior to prescribing opiate medications. No inconsistencies noted.  Meds ordered this encounter  Medications   oxyCODONE (ROXICODONE) 15 MG immediate release tablet    Sig: Take 1 tablet (15 mg total) by mouth every 6 (six) hours as needed for pain.    Dispense:  60 tablet    Refill:  0    Order Specific Question:   Supervising Provider    Answer:   [name], [name]   Giovanni Bath Moore Lou Loewe  APRN, MSN, FNP-C [facility] [address]  [place], [place] [address] [phone]  

## 2023-01-14 ENCOUNTER — Encounter (HOSPITAL_COMMUNITY): Payer: Self-pay | Admitting: Family Medicine

## 2023-01-14 DIAGNOSIS — Z8261 Family history of arthritis: Secondary | ICD-10-CM | POA: Diagnosis not present

## 2023-01-14 DIAGNOSIS — Z833 Family history of diabetes mellitus: Secondary | ICD-10-CM | POA: Diagnosis not present

## 2023-01-14 DIAGNOSIS — Z8249 Family history of ischemic heart disease and other diseases of the circulatory system: Secondary | ICD-10-CM | POA: Diagnosis not present

## 2023-01-14 DIAGNOSIS — D72829 Elevated white blood cell count, unspecified: Secondary | ICD-10-CM | POA: Diagnosis present

## 2023-01-14 DIAGNOSIS — D5700 Hb-SS disease with crisis, unspecified: Secondary | ICD-10-CM | POA: Diagnosis present

## 2023-01-14 DIAGNOSIS — F32A Depression, unspecified: Secondary | ICD-10-CM

## 2023-01-14 DIAGNOSIS — Z86711 Personal history of pulmonary embolism: Secondary | ICD-10-CM | POA: Diagnosis not present

## 2023-01-14 DIAGNOSIS — Z9049 Acquired absence of other specified parts of digestive tract: Secondary | ICD-10-CM | POA: Diagnosis not present

## 2023-01-14 DIAGNOSIS — I272 Pulmonary hypertension, unspecified: Secondary | ICD-10-CM | POA: Diagnosis present

## 2023-01-14 DIAGNOSIS — G8929 Other chronic pain: Secondary | ICD-10-CM

## 2023-01-14 DIAGNOSIS — F419 Anxiety disorder, unspecified: Secondary | ICD-10-CM

## 2023-01-14 DIAGNOSIS — Z823 Family history of stroke: Secondary | ICD-10-CM | POA: Diagnosis not present

## 2023-01-14 DIAGNOSIS — Z79899 Other long term (current) drug therapy: Secondary | ICD-10-CM | POA: Diagnosis not present

## 2023-01-14 DIAGNOSIS — G894 Chronic pain syndrome: Secondary | ICD-10-CM | POA: Diagnosis present

## 2023-01-14 DIAGNOSIS — F319 Bipolar disorder, unspecified: Secondary | ICD-10-CM | POA: Diagnosis present

## 2023-01-14 DIAGNOSIS — Z91040 Latex allergy status: Secondary | ICD-10-CM | POA: Diagnosis not present

## 2023-01-14 DIAGNOSIS — Z7901 Long term (current) use of anticoagulants: Secondary | ICD-10-CM | POA: Diagnosis not present

## 2023-01-14 DIAGNOSIS — F1120 Opioid dependence, uncomplicated: Secondary | ICD-10-CM | POA: Diagnosis present

## 2023-01-14 DIAGNOSIS — Z808 Family history of malignant neoplasm of other organs or systems: Secondary | ICD-10-CM | POA: Diagnosis not present

## 2023-01-14 DIAGNOSIS — F17210 Nicotine dependence, cigarettes, uncomplicated: Secondary | ICD-10-CM | POA: Diagnosis present

## 2023-01-14 DIAGNOSIS — Z9081 Acquired absence of spleen: Secondary | ICD-10-CM | POA: Diagnosis not present

## 2023-01-14 MED ORDER — HYDROMORPHONE HCL 2 MG/ML IJ SOLN
2.0000 mg | INTRAMUSCULAR | Status: DC | PRN
  Administered 2023-01-14 (×4): 2 mg via INTRAVENOUS
  Filled 2023-01-14 (×4): qty 1

## 2023-01-14 MED ORDER — DIPHENHYDRAMINE HCL 25 MG PO CAPS
25.0000 mg | ORAL_CAPSULE | ORAL | Status: DC | PRN
  Administered 2023-01-14 (×2): 25 mg via ORAL
  Filled 2023-01-14 (×2): qty 1

## 2023-01-14 MED ORDER — GABAPENTIN 300 MG PO CAPS
300.0000 mg | ORAL_CAPSULE | Freq: Two times a day (BID) | ORAL | Status: DC
  Administered 2023-01-14 – 2023-01-16 (×5): 300 mg via ORAL
  Filled 2023-01-14 (×5): qty 1

## 2023-01-14 MED ORDER — POLYETHYLENE GLYCOL 3350 17 G PO PACK: 17.0000 g | PACK | Freq: Every day | ORAL | Status: DC | PRN

## 2023-01-14 MED ORDER — HYDROMORPHONE 1 MG/ML IV SOLN
INTRAVENOUS | Status: DC
  Administered 2023-01-14: 30 mg via INTRAVENOUS
  Administered 2023-01-15: 4 mg via INTRAVENOUS
  Administered 2023-01-15: 6.5 mg via INTRAVENOUS
  Administered 2023-01-15: 7 mg via INTRAVENOUS
  Administered 2023-01-15: 5 mg via INTRAVENOUS
  Administered 2023-01-15: 30 mg via INTRAVENOUS
  Administered 2023-01-16: 8 mg via INTRAVENOUS
  Administered 2023-01-16: 11 mg via INTRAVENOUS
  Administered 2023-01-16: 7 mg via INTRAVENOUS
  Administered 2023-01-16: 6.5 mg via INTRAVENOUS
  Administered 2023-01-16: 30 mg via INTRAVENOUS
  Filled 2023-01-14 (×3): qty 30

## 2023-01-14 MED ORDER — SODIUM CHLORIDE 0.45 % IV SOLN: INTRAVENOUS | Status: AC

## 2023-01-14 MED ORDER — HYDROMORPHONE HCL 1 MG/ML IJ SOLN
1.0000 mg | INTRAMUSCULAR | Status: AC | PRN
  Administered 2023-01-14 (×2): 1 mg via INTRAVENOUS
  Filled 2023-01-14 (×2): qty 1

## 2023-01-14 MED ORDER — SODIUM CHLORIDE 0.9% FLUSH
9.0000 mL | INTRAVENOUS | Status: DC | PRN
  Administered 2023-01-14: 9 mL via INTRAVENOUS

## 2023-01-14 MED ORDER — SENNOSIDES-DOCUSATE SODIUM 8.6-50 MG PO TABS
1.0000 | ORAL_TABLET | Freq: Two times a day (BID) | ORAL | Status: DC
  Administered 2023-01-14 – 2023-01-15 (×4): 1 via ORAL
  Filled 2023-01-14 (×6): qty 1

## 2023-01-14 MED ORDER — KETOROLAC TROMETHAMINE 15 MG/ML IJ SOLN
15.0000 mg | Freq: Four times a day (QID) | INTRAMUSCULAR | Status: DC
  Administered 2023-01-14 – 2023-01-16 (×10): 15 mg via INTRAVENOUS
  Filled 2023-01-14 (×9): qty 1

## 2023-01-14 MED ORDER — ONDANSETRON HCL 4 MG/2ML IJ SOLN
4.0000 mg | Freq: Four times a day (QID) | INTRAMUSCULAR | Status: DC | PRN
  Administered 2023-01-14 – 2023-01-15 (×2): 4 mg via INTRAVENOUS
  Filled 2023-01-14 (×2): qty 2

## 2023-01-14 MED ORDER — HYDROMORPHONE HCL 1 MG/ML IJ SOLN
1.0000 mg | Freq: Once | INTRAMUSCULAR | Status: AC
  Administered 2023-01-14: 1 mg via INTRAVENOUS
  Filled 2023-01-14: qty 1

## 2023-01-14 MED ORDER — HYDROXYUREA 500 MG PO CAPS
1500.0000 mg | ORAL_CAPSULE | Freq: Every day | ORAL | Status: DC
  Administered 2023-01-14 – 2023-01-16 (×3): 1500 mg via ORAL
  Filled 2023-01-14 (×3): qty 3

## 2023-01-14 MED ORDER — TIZANIDINE HCL 4 MG PO TABS
4.0000 mg | ORAL_TABLET | Freq: Three times a day (TID) | ORAL | Status: DC | PRN
  Administered 2023-01-14 – 2023-01-16 (×3): 4 mg via ORAL
  Filled 2023-01-14 (×3): qty 1

## 2023-01-14 MED ORDER — RIVAROXABAN 20 MG PO TABS
20.0000 mg | ORAL_TABLET | Freq: Every day | ORAL | Status: DC
  Administered 2023-01-14 – 2023-01-15 (×2): 20 mg via ORAL
  Filled 2023-01-14 (×2): qty 1

## 2023-01-14 MED ORDER — OXYCODONE HCL ER 15 MG PO T12A
15.0000 mg | EXTENDED_RELEASE_TABLET | Freq: Two times a day (BID) | ORAL | Status: DC
  Administered 2023-01-14 – 2023-01-16 (×5): 15 mg via ORAL
  Filled 2023-01-14 (×5): qty 1

## 2023-01-14 MED ORDER — NALOXONE HCL 0.4 MG/ML IJ SOLN: 0.4000 mg | INTRAMUSCULAR | Status: DC | PRN

## 2023-01-14 MED ORDER — QUETIAPINE FUMARATE 100 MG PO TABS
100.0000 mg | ORAL_TABLET | Freq: Every day | ORAL | Status: DC
  Administered 2023-01-14 – 2023-01-15 (×2): 100 mg via ORAL
  Filled 2023-01-14 (×2): qty 1

## 2023-01-14 NOTE — H&P (Signed)
Subjective: Sandra Mckay is a 27 year old female with a medical history significant for sickle cell disease, chronic pain syndrome, opiate dependence and tolerance, history of anemia of chronic disease, and history of bipolar depression was admitted overnight for sickle cell pain crisis. Patient reports severe pain primarily to lower extremities and bilateral hips.  She says that pain was unrelieved by her home medications.  Patient rates pain as 9/10.  She is awaiting bed placement.  She denies headache, chest pain, shortness of breath, urinary symptoms, nausea, vomiting, or diarrhea.  Objective:  Vital signs in last 24 hours:  Vitals:   01/14/23 0641 01/14/23 1026 01/14/23 1333 01/14/23 1335  BP: 101/60 114/89 100/60   Pulse: (!) 58 63 60   Resp: 16 17 18    Temp: 97.7 F (36.5 C) 97.8 F (36.6 C)  98 F (36.7 C)  TempSrc: Oral Oral  Oral  SpO2: 97% 99% 99%   Weight:      Height:        Intake/Output from previous day:   Intake/Output Summary (Last 24 hours) at 01/14/2023 1410 Last data filed at 01/14/2023 9147 Gross per 24 hour  Intake 206.65 ml  Output --  Net 206.65 ml    Physical Exam: General: Alert, awake, oriented x3, in no acute distress.  HEENT: [place]/AT PEERL, EOMI Neck: Trachea midline,  no masses, no thyromegal,y no JVD, no carotid bruit OROPHARYNX:  Moist, No exudate/ erythema/lesions.  Heart: Regular rate and rhythm, without murmurs, rubs, gallops, PMI non-displaced, no heaves or thrills on palpation.  Lungs: Clear to auscultation, no wheezing or rhonchi noted. No increased vocal fremitus resonant to percussion  Abdomen: Soft, nontender, nondistended, positive bowel sounds, no masses no hepatosplenomegaly noted..  Neuro: No focal neurological deficits noted cranial nerves II through XII grossly intact. DTRs 2+ bilaterally upper and lower extremities. Strength 5 out of 5 in bilateral upper and lower extremities. Musculoskeletal: No warm swelling or erythema  around joints, no spinal tenderness noted. Psychiatric: Patient alert and oriented x3, good insight and cognition, good recent to remote recall. Lymph node survey: No cervical axillary or inguinal lymphadenopathy noted.  Lab Results:  Basic Metabolic Panel:    Component Value Date/Time   NA 137 01/13/2023 2012   NA 140 11/03/2022 1209   K 3.8 01/13/2023 2012   CL 106 01/13/2023 2012   CO2 25 01/13/2023 2012   BUN 9 01/13/2023 2012   BUN 8 11/03/2022 1209   CREATININE 0.67 01/13/2023 2012   CREATININE 0.53 05/31/2017 1145   GLUCOSE 113 (H) 01/13/2023 2012   CALCIUM 9.0 01/13/2023 2012   CBC:    Component Value Date/Time   WBC 12.0 (H) 01/13/2023 2012   HGB 7.5 (L) 01/13/2023 2012   HGB 9.4 (L) 11/03/2022 1209   HCT 21.7 (L) 01/13/2023 2012   HCT 27.9 (L) 11/03/2022 1209   PLT 428 (H) 01/13/2023 2012   PLT 887 (HH) 11/03/2022 1209   MCV 106.9 (H) 01/13/2023 2012   MCV 105 (H) 11/03/2022 1209   NEUTROABS 4.2 01/13/2023 2012   NEUTROABS 3.7 11/03/2022 1209   LYMPHSABS 5.9 (H) 01/13/2023 2012   LYMPHSABS 4.3 (H) 11/03/2022 1209   MONOABS 1.6 (H) 01/13/2023 2012   EOSABS 0.1 01/13/2023 2012   EOSABS 0.1 11/03/2022 1209   BASOSABS 0.1 01/13/2023 2012   BASOSABS 0.1 11/03/2022 1209    No results found for this or any previous visit (from the past 240 hour(s)).  Studies/Results: No results found.  Medications: Scheduled Meds:  gabapentin  300 mg Oral BID   HYDROmorphone   Intravenous Q4H   hydroxyurea  1,500 mg Oral Daily   ketorolac  15 mg Intravenous Q6H   oxyCODONE  15 mg Oral Q12H   QUEtiapine  100 mg Oral QHS   rivaroxaban  20 mg Oral Q supper   senna-docusate  1 tablet Oral BID   Continuous Infusions:  sodium chloride 75 mL/hr at 01/14/23 1209   PRN Meds:.diphenhydrAMINE, HYDROmorphone (DILAUDID) injection, naloxone **AND** sodium chloride flush, ondansetron (ZOFRAN) IV, polyethylene glycol,  tiZANidine  Consultants: none  Procedures: none  Antibiotics: none  Assessment/Plan: Principal Problem:   Sickle cell pain crisis (HCC) Active Problems:   Anxiety and depression   Chronic pain   History of pulmonary embolism  Sickle cell disease with pain crisis: Admit.  Patient is awaiting bed placement.  Dilaudid 2 mg IV every 2 hours as needed while in the urgency department. When bed becomes available, initiate IV Dilaudid PCA with settings of 0.5 mg, 10-minute lockout, and 3 mg/h Toradol 15 mg IV every 6 hours OxyContin 15 mg every 12 hours IV fluids, 0.45% saline at 75 mL/h Monitor vital signs very closely, reevaluate pain scale regularly, and supplemental oxygen as needed  Chronic pain syndrome: Continue home medications  Anemia of chronic disease: Patient's hemoglobin has improved to 7.5 g/dL.  Consistent with her baseline.  There is no clinical indication for blood transfusion at this time.  Monitor closely.  Leukocytosis: Mild leukocytosis.  No signs of acute infection.  Continue to monitor closely.  Labs in AM.  Bipolar depression: Stable.  Continue home medications.  No suicidal homicidal intentions today.   Code Status: Full Code Family Communication: N/A Disposition Plan: Not yet ready for discharge  Javion Holmer Rennis Petty  APRN, MSN, FNP-C [facility] [address] [place], [place] [address] [phone]  If 7PM-7AM, please contact night-coverage.  01/14/2023, 2:10 PM  LOS: 0 days

## 2023-01-14 NOTE — H&P (Signed)
History and Physical    Sandra Mckay ZOX:[id number] DOB: 10-27-1995 DOA: 01/13/2023  PCP: Massie Maroon, FNP   Patient coming from: Home   Chief Complaint: Pain   HPI: Sandra Mckay is a 27 y.o. female with medical history significant for sickle cell anemia, chronic pain, history of PE on Xarelto, depression, and anxiety, now presenting to the emergency department for evaluation of pain.  Patient reports approximately 1 week of severe pain in the bilateral hips and back.  She has had some minimal improvement with her home medications, and fleeting relief with treatment at the sickle cell day infusion center earlier this week, but reports that the pain has become unbearable again.  She denies any abdominal pain, chest pain, shortness of breath, cough, dysuria, fever, or chills.  ED Course: Upon arrival to the ED, patient is found to be afebrile and saturating mid 90s on room air with normal heart rate and stable blood pressure.  Labs are notable for WBC 12,000, hemoglobin 7.5, platelets 1 28,000, and total bilirubin 2.1.  Patient was given IV fluids, Toradol, Zofran, and 4 doses of Dilaudid in the ED.  She reports some improvement but continues to have severe pain.  Review of Systems:  All other systems reviewed and apart from HPI, are negative.  Past Medical History:  Diagnosis Date   Acute cystitis without hematuria 10/21/2019   Blood transfusion without reported diagnosis    Bronchitis    Chickenpox    Depression    Elevated ferritin level 05/2019   Heart murmur    Nausea without vomiting 09/09/2015   Pulmonary hypertension (HCC)    Sickle cell anemia (HCC)    Sickle cell disease, type SS (HCC)    Sickle cell pain crisis (HCC) 12/05/2016   Thrombocytosis 05/2019   Urinary tract infection    Vitamin D deficiency     Past Surgical History:  Procedure Laterality Date   CHOLECYSTECTOMY  2011   EYE SURGERY     Sty removal   IR IMAGING GUIDED PORT INSERTION  03/06/2022    IR REMOVAL TUN ACCESS W/ PORT W/O FL MOD SED  05/29/2022   LABIAL ADHESION LYSIS  1999   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 She has been smoking an average of 1 pack per day. She has never used smokeless tobacco. She reports current alcohol use. She reports current drug use. Drug: Marijuana.  Allergies  Allergen Reactions   Latex Rash   Wound Dressing Adhesive Rash    Family History  Problem Relation Age of Onset   Arthritis Other        grandparent   Stroke Other    Hypertension Other    Diabetes Other        grandparent   Cancer - Other Other        Glioblastoma     Prior to Admission medications   Medication Sig Start Date End Date Taking? Authorizing Provider  diphenhydrAMINE (BENADRYL ALLERGY) 25 mg capsule Take 50 mg by mouth every 6 (six) hours as needed for itching.   Yes [provider]  ergocalciferol (DRISDOL) 1.25 MG (50000 UT) capsule Take 1 capsule (50,000 Units total) by mouth once a week. 11/11/22  Yes Massie Maroon, FNP  folic acid (FOLVITE) 1 MG tablet Take 1 tablet (1 mg total) by mouth daily. 04/20/22  Yes [name], MD  gabapentin (NEURONTIN) 300 MG capsule Take 1 capsule (300 mg total) by mouth 2 (two) times daily. 01/05/23  Yes Massie Maroon, FNP  hydroxyurea (HYDREA) 500 MG capsule Take 3 capsules (1,500 mg total) by mouth daily. TAKE 3 CAPSULES (1,500 MG TOTAL) BY MOUTH DAILY. 04/20/22  Yes [name], MD  hydrOXYzine (ATARAX) 25 MG tablet Take 12.5 mg by mouth 2 (two) times daily as needed for itching.   Yes [provider]  ondansetron (ZOFRAN) 4 MG tablet Take 1 tablet (4 mg total) by mouth every 8 (eight) hours as needed for nausea or vomiting. 11/03/22  Yes Massie Maroon, FNP  oxyCODONE (ROXICODONE) 15 MG immediate release tablet Take 1 tablet (15 mg total) by mouth every 6 (six) hours  as needed for pain. 01/16/23  Yes Massie Maroon, FNP  oxyCODONE ER [facility]) 13.5 MG C12A Take 13.5 mg by mouth every 12 (twelve) hours for 7 days. 01/12/23 01/19/23 Yes Massie Maroon, FNP  QUEtiapine (SEROQUEL) 100 MG tablet Take 100 mg by mouth at bedtime.   Yes [provider]  rivaroxaban (XARELTO) 20 MG TABS tablet Take 1 tablet (20 mg total) by mouth daily with supper. 05/31/22  Yes [name], Phylliss Blakes, MD  tiZANidine (ZANAFLEX) 4 MG tablet Take 4 mg by mouth every 8 (eight) hours as needed for muscle spasms.   Yes [provider]  voxelotor (OXBRYTA) 500 MG TABS tablet Take 1,000 mg by mouth daily. 09/14/22  Yes Massie Maroon, FNP  QUEtiapine (SEROQUEL) 25 MG tablet TAKE 1 TABLET BY MOUTH EVERYDAY AT BEDTIME Patient not taking: Reported on 01/05/2023 06/03/22   Massie Maroon, FNP    Physical Exam: Vitals:   01/13/23 2315 01/14/23 0015 01/14/23 0200 01/14/23 0230  BP: 112/71 (!) 117/54  (!) 96/55  Pulse: 79 68  63  Resp: 16 16  18   Temp:   97.7 F (36.5 C)   TempSrc:   Oral   SpO2: 95% 95%  95%  Weight:      Height:         Constitutional: NAD, no pallor or diaphoresis  Eyes: PERTLA, lids and conjunctivae normal ENMT: Mucous membranes are moist. Posterior pharynx clear of any exudate or lesions.   Neck: supple, no masses  Respiratory: no wheezing, no crackles. No accessory muscle use.  Cardiovascular: S1 & S2 heard, regular rate and rhythm. No extremity edema.  Abdomen: No distension, no tenderness, soft. Bowel sounds active.  Musculoskeletal: no clubbing / cyanosis. No joint deformity upper and lower extremities.   Skin: no significant rashes, lesions, ulcers. Warm, dry, well-perfused. Neurologic: CN 2-12 grossly intact. Moving all extremities. Alert and oriented.  Psychiatric: Calm. Cooperative.    Labs and Imaging on Admission: I have personally reviewed following labs and imaging studies  CBC: Recent Labs  Lab 01/11/23 1015  01/13/23 2012  WBC 10.3 12.0*  NEUTROABS  --  4.2  HGB 6.9* 7.5*  HCT 19.8* 21.7*  MCV 105.9* 106.9*  PLT 402* 428*   Basic Metabolic Panel: Recent Labs  Lab 01/13/23 2012  NA 137  K 3.8  CL 106  CO2 25  GLUCOSE 113*  BUN 9  CREATININE 0.67  CALCIUM 9.0   GFR: Estimated Creatinine Clearance: 82.1 mL/min (by C-G formula based on SCr of 0.67 mg/dL). Liver Function Tests: Recent Labs  Lab 01/13/23 2012  AST 35  ALT 35  ALKPHOS 66  BILITOT 2.1*  PROT 7.3  ALBUMIN 4.3   No results for input(s): "LIPASE", "AMYLASE" in the last 168 hours. No results for input(s): "AMMONIA" in the last 168 hours. Coagulation Profile: No results for input(s): "INR", "PROTIME" in the last 168 hours. Cardiac Enzymes: No results for input(s): "CKTOTAL", "CKMB", "CKMBINDEX", "TROPONINI" in the last 168 hours. BNP (last 3 results) No results for input(s): "PROBNP" in the last 8760 hours. HbA1C: No results for input(s): "HGBA1C" in the last 72 hours. CBG: No results for input(s): "GLUCAP" in the last 168 hours. Lipid Profile: No results for input(s): "CHOL", "HDL", "LDLCALC", "TRIG", "CHOLHDL", "LDLDIRECT" in the last 72 hours. Thyroid Function Tests: No results for input(s): "TSH", "T4TOTAL", "FREET4", "T3FREE", "THYROIDAB" in the last 72 hours. Anemia Panel: Recent Labs    01/13/23 2012  RETICCTPCT 28.5*   Urine analysis:    Component Value Date/Time   COLORURINE YELLOW 10/22/2022 1235   APPEARANCEUR HAZY (A) 10/22/2022 1235   LABSPEC 1.009 10/22/2022 1235   PHURINE 8.0 10/22/2022 1235   GLUCOSEU NEGATIVE 10/22/2022 1235   HGBUR SMALL (A) 10/22/2022 1235   BILIRUBINUR small 11/03/2022 1223   KETONESUR NEGATIVE 10/22/2022 1235   PROTEINUR Negative 11/03/2022 1223   PROTEINUR NEGATIVE 10/22/2022 1235   UROBILINOGEN 1.0 11/03/2022 1223   UROBILINOGEN >=8.0 08/04/2017 1144   NITRITE n 11/03/2022 1223   NITRITE NEGATIVE 10/22/2022 1235   LEUKOCYTESUR Negative 11/03/2022 1223    LEUKOCYTESUR TRACE (A) 10/22/2022 1235   Sepsis Labs: @LABRCNTIP (procalcitonin:4,lacticidven:4) )No results found for this or any previous visit (from the past 240 hour(s)).   Radiological Exams on Admission: No results found.   Assessment/Plan   1. Sickle cell anemia with pain crisis; chronic pain  - Continue IVF hydration with 0.45% NaCl, start Dilaudid PCA, schedule Toradol, continue long-acting oxycodone, continue gabapentin and as-needed tizanidine    2. Hx of PE  - Continue Xarelto   3. Depression, anxiety  - Continue Seroquel    DVT prophylaxis: Xarelto  Code Status: Full  Level of Care: Level of care: Med-Surg Family Communication: none present  Disposition Plan:  Patient is from: home  Anticipated d/c is to: home  Anticipated d/c date is: 01/17/23  Patient currently: Pending pain-control  Consults called: None  Admission status: Inpatient     Briscoe Deutscher, MD [facility]  01/14/2023, 3:18 AM

## 2023-01-14 NOTE — ED Notes (Signed)
Pt tolerating juice w/o issue.

## 2023-01-14 NOTE — ED Notes (Signed)
Pt currently resting in bed, eyes closed, respirations equal and unlabored, no acute distress noted, will continue to monitor.

## 2023-01-14 NOTE — ED Provider Notes (Signed)
I assumed care at signout.  Plan was to recheck pain status after undergoing sickle cell protocol.  Patient reports still having back and extremity pain.  Denies any active chest pain at this time.  Labs were overall at her baseline.  She is overall neurovascularly intact  Due to persistent pain, patient be admitted for pain management.  Discussed with Dr. Antionette Char for admission   Zadie Rhine, MD 01/14/23 [phone]

## 2023-01-15 DIAGNOSIS — D5700 Hb-SS disease with crisis, unspecified: Secondary | ICD-10-CM | POA: Diagnosis not present

## 2023-01-15 LAB — CBC
HCT: 19.2 % — ABNORMAL LOW (ref 36.0–46.0)
Hemoglobin: 6.8 g/dL — CL (ref 12.0–15.0)
MCH: 37.4 pg — ABNORMAL HIGH (ref 26.0–34.0)
MCHC: 35.4 g/dL (ref 30.0–36.0)
MCV: 105.5 fL — ABNORMAL HIGH (ref 80.0–100.0)
Platelets: 416 10*3/uL — ABNORMAL HIGH (ref 150–400)
RBC: 1.82 MIL/uL — ABNORMAL LOW (ref 3.87–5.11)
RDW: 23.2 % — ABNORMAL HIGH (ref 11.5–15.5)
WBC: 10 10*3/uL (ref 4.0–10.5)
nRBC: 1.1 % — ABNORMAL HIGH (ref 0.0–0.2)

## 2023-01-15 LAB — TYPE AND SCREEN
Antibody Screen: NEGATIVE
Donor AG Type: NEGATIVE

## 2023-01-15 LAB — BPAM RBC: Unit Type and Rh: 9500

## 2023-01-15 LAB — PREPARE RBC (CROSSMATCH)

## 2023-01-15 MED ORDER — ACETAMINOPHEN 325 MG PO TABS
650.0000 mg | ORAL_TABLET | Freq: Once | ORAL | Status: AC
  Administered 2023-01-15: 650 mg via ORAL
  Filled 2023-01-15: qty 2

## 2023-01-15 MED ORDER — SODIUM CHLORIDE 0.9% IV SOLUTION: Freq: Once | INTRAVENOUS | Status: AC

## 2023-01-15 MED ORDER — DIPHENHYDRAMINE HCL 50 MG/ML IJ SOLN
25.0000 mg | Freq: Once | INTRAMUSCULAR | Status: AC
  Administered 2023-01-15: 25 mg via INTRAVENOUS
  Filled 2023-01-15: qty 1

## 2023-01-15 MED ORDER — VOXELOTOR 500 MG PO TABS
1000.0000 mg | ORAL_TABLET | Freq: Every day | ORAL | Status: DC
  Administered 2023-01-16: 1000 mg via ORAL

## 2023-01-15 NOTE — Progress Notes (Signed)
Subjective: Sandra Mckay is a 27 year old female with a medical history significant for sickle cell disease, chronic pain syndrome, opiate dependence and tolerance, history of PE on Xarelto, and anemia of chronic disease that was admitted for sickle cell pain crisis. Today, patient hemoglobin has decreased to 6.8 g/dL, which is below her baseline of 7-8.  Patient continues to complain of allover body pain rated 9/10.  She denies any headache, shortness of breath, dizziness, urinary symptoms, nausea, vomiting, or diarrhea.  Objective:  Vital signs in last 24 hours:  Vitals:   01/15/23 1239 01/15/23 1552 01/15/23 1558 01/15/23 1723  BP: (!) 101/59  113/65 113/79  Pulse: 71  67 77  Resp: 18 16 14 18   Temp: 97.7 F (36.5 C)  99.1 F (37.3 C) 98.8 F (37.1 C)  TempSrc: Oral  Oral Oral  SpO2: 100% 96% 96% 94%  Weight:      Height:        Intake/Output from previous day:   Intake/Output Summary (Last 24 hours) at 01/15/2023 1924 Last data filed at 01/15/2023 1604 Gross per 24 hour  Intake 707.5 ml  Output --  Net 707.5 ml    Physical Exam: General: Alert, awake, oriented x3, in no acute distress.  HEENT: [place]/AT PEERL, EOMI Neck: Trachea midline,  no masses, no thyromegal,y no JVD, no carotid bruit OROPHARYNX:  Moist, No exudate/ erythema/lesions.  Heart: Regular rate and rhythm, without murmurs, rubs, gallops, PMI non-displaced, no heaves or thrills on palpation.  Lungs: Clear to auscultation, no wheezing or rhonchi noted. No increased vocal fremitus resonant to percussion  Abdomen: Soft, nontender, nondistended, positive bowel sounds, no masses no hepatosplenomegaly noted..  Neuro: No focal neurological deficits noted cranial nerves II through XII grossly intact. DTRs 2+ bilaterally upper and lower extremities. Strength 5 out of 5 in bilateral upper and lower extremities. Musculoskeletal: No warm swelling or erythema around joints, no spinal tenderness noted. Psychiatric:  Patient alert and oriented x3, good insight and cognition, good recent to remote recall. Lymph node survey: No cervical axillary or inguinal lymphadenopathy noted.  Lab Results:  Basic Metabolic Panel:    Component Value Date/Time   NA 137 01/13/2023 2012   NA 140 11/03/2022 1209   K 3.8 01/13/2023 2012   CL 106 01/13/2023 2012   CO2 25 01/13/2023 2012   BUN 9 01/13/2023 2012   BUN 8 11/03/2022 1209   CREATININE 0.67 01/13/2023 2012   CREATININE 0.53 05/31/2017 1145   GLUCOSE 113 (H) 01/13/2023 2012   CALCIUM 9.0 01/13/2023 2012   CBC:    Component Value Date/Time   WBC 10.0 01/15/2023 0858   HGB 6.8 (LL) 01/15/2023 0858   HGB 9.4 (L) 11/03/2022 1209   HCT 19.2 (L) 01/15/2023 0858   HCT 27.9 (L) 11/03/2022 1209   PLT 416 (H) 01/15/2023 0858   PLT 887 (HH) 11/03/2022 1209   MCV 105.5 (H) 01/15/2023 0858   MCV 105 (H) 11/03/2022 1209   NEUTROABS 4.2 01/13/2023 2012   NEUTROABS 3.7 11/03/2022 1209   LYMPHSABS 5.9 (H) 01/13/2023 2012   LYMPHSABS 4.3 (H) 11/03/2022 1209   MONOABS 1.6 (H) 01/13/2023 2012   EOSABS 0.1 01/13/2023 2012   EOSABS 0.1 11/03/2022 1209   BASOSABS 0.1 01/13/2023 2012   BASOSABS 0.1 11/03/2022 1209    No results found for this or any previous visit (from the past 240 hour(s)).  Studies/Results: No results found.  Medications: Scheduled Meds:  gabapentin  300 mg Oral BID   HYDROmorphone  Intravenous Q4H   hydroxyurea  1,500 mg Oral Daily   ketorolac  15 mg Intravenous Q6H   oxyCODONE  15 mg Oral Q12H   QUEtiapine  100 mg Oral QHS   rivaroxaban  20 mg Oral Q supper   senna-docusate  1 tablet Oral BID   [START ON 01/16/2023] voxelotor  1,000 mg Oral Daily   Continuous Infusions: PRN Meds:.diphenhydrAMINE, naloxone **AND** sodium chloride flush, ondansetron (ZOFRAN) IV, polyethylene glycol, tiZANidine  Consultants: none  Procedures: none  Antibiotics: none  Assessment/Plan: Principal Problem:   Sickle cell pain crisis  (HCC) Active Problems:   Anxiety and depression   Chronic pain   History of pulmonary embolism   Sickle cell disease with pain crisis: Continue IV Dilaudid PCA without any changes Toradol 15 mg IV every 6 hours OxyContin 15 mg every 12 hours IV fluids to [facility] Monitor vital signs very closely, reevaluate pain scale regularly, and supplemental oxygen as needed  Chronic pain syndrome: Continue home medications  Anemia of chronic disease: Hemoglobin is decreased to 6.8 g/dL, below patient's baseline.  Transfuse 1 unit PRBCs today.  Labs in AM.  Leukocytosis: Stable.  Monitor closely.  No signs of acute infection.  Follow labs in AM.  Bipolar depression: Stable.  Continue home medications.  Code Status: Full Code Family Communication: N/A Disposition Plan: Not yet ready for discharge  Sandra Colasurdo Rennis Petty  APRN, MSN, FNP-C [facility] [address] [place], [place] [address] [phone]  If 7PM-7AM, please contact night-coverage.  01/15/2023, 7:24 PM  LOS: 1 day

## 2023-01-16 DIAGNOSIS — D5700 Hb-SS disease with crisis, unspecified: Secondary | ICD-10-CM | POA: Diagnosis not present

## 2023-01-16 LAB — CBC
HCT: 21.1 % — ABNORMAL LOW (ref 36.0–46.0)
Hemoglobin: 7.7 g/dL — ABNORMAL LOW (ref 12.0–15.0)
MCH: 35.6 pg — ABNORMAL HIGH (ref 26.0–34.0)
MCHC: 36.5 g/dL — ABNORMAL HIGH (ref 30.0–36.0)
MCV: 97.7 fL (ref 80.0–100.0)
Platelets: 269 10*3/uL (ref 150–400)
RBC: 2.16 MIL/uL — ABNORMAL LOW (ref 3.87–5.11)
RDW: 23.4 % — ABNORMAL HIGH (ref 11.5–15.5)
WBC: 9.1 10*3/uL (ref 4.0–10.5)
nRBC: 1.4 % — ABNORMAL HIGH (ref 0.0–0.2)

## 2023-01-16 LAB — COMPREHENSIVE METABOLIC PANEL
ALT: 71 U/L — ABNORMAL HIGH (ref 0–44)
AST: 65 U/L — ABNORMAL HIGH (ref 15–41)
Albumin: 3.9 g/dL (ref 3.5–5.0)
Alkaline Phosphatase: 96 U/L (ref 38–126)
Anion gap: 9 (ref 5–15)
BUN: 9 mg/dL (ref 6–20)
CO2: 23 mmol/L (ref 22–32)
Calcium: 8.7 mg/dL — ABNORMAL LOW (ref 8.9–10.3)
Chloride: 104 mmol/L (ref 98–111)
Creatinine, Ser: 0.39 mg/dL — ABNORMAL LOW (ref 0.44–1.00)
GFR, Estimated: >60 mL/min (ref >60)
Glucose, Bld: 84 mg/dL (ref 70–99)
Potassium: 4.2 mmol/L (ref 3.5–5.1)
Sodium: 136 mmol/L (ref 135–145)
Total Bilirubin: 2.9 mg/dL — ABNORMAL HIGH (ref 0.3–1.2)
Total Protein: 6.3 g/dL — ABNORMAL LOW (ref 6.5–8.1)

## 2023-01-16 LAB — TYPE AND SCREEN
ABO/RH(D): A POS
Unit division: 0

## 2023-01-16 LAB — BPAM RBC
Blood Product Expiration Date: 202407122359
ISSUE DATE / TIME: 202406141216

## 2023-01-16 NOTE — TOC Initial Note (Signed)
Transition of Care [facility]) - Initial/Assessment Note    Patient Details  Name: Sandra Mckay MRN: [id number] Date of Birth: 1996-05-21  Transition of Care [facility]) CM/SW Contact:    Adrian Prows, RN Phone Number: 01/16/2023, 4:17 PM  Clinical Narrative:                 Sherron Monday w/ pt over phone; pt says she is from home and plans to return at discharge; she identified POC mother Sandra Mckay [phone]); pt denies food/housing insecurity, difficulty paying utilities, and IPV; she has transportation; pt says she has cane, shower chair, and bars in shower; pt says she does not receive HH services; pt says she does not have home oxygen; TOC signing off; please place consult if needed.  Expected Discharge Plan: Home/Self Care Barriers to Discharge: Continued Medical Work up   Patient Goals and CMS Choice Patient states their goals for this hospitalization and ongoing recovery are:: home          Expected Discharge Plan and Services   Discharge Planning Services: CM Consult   Living arrangements for the past 2 months: Single Family Home                                      Prior Living Arrangements/Services Living arrangements for the past 2 months: Single Family Home Lives with:: Parents Patient language and need for interpreter reviewed:: Yes Do you feel safe going back to the place where you live?: Yes      Need for Family Participation in Patient Care: Yes (Comment) Care giver support system in place?: Yes (comment) Current home services: DME (cane, shower chair) Criminal Activity/Legal Involvement Pertinent to Current Situation/Hospitalization: No - Comment as needed  Activities of Daily Living Home Assistive Devices/Equipment: None ADL Screening (condition at time of admission) Patient's cognitive ability adequate to safely complete daily activities?: Yes Is the patient deaf or have difficulty hearing?: No Does the patient have difficulty seeing,  even when wearing glasses/contacts?: No Does the patient have difficulty concentrating, remembering, or making decisions?: No Patient able to express need for assistance with ADLs?: Yes Does the patient have difficulty dressing or bathing?: No Independently performs ADLs?: Yes (appropriate for developmental age) Does the patient have difficulty walking or climbing stairs?: No Weakness of Legs: None Weakness of Arms/Hands: None  Permission Sought/Granted Permission sought to share information with : Case Manager Permission granted to share information with : Yes, Verbal Permission Granted  Share Information with NAME: Case Manager     Permission granted to share info w Relationship: Sandra Mckay (mother) [phone]     Emotional Assessment Appearance:: Other (Comment Required (unable to assess) Attitude/Demeanor/Rapport: Gracious Affect (typically observed): Accepting Orientation: : Oriented to Self, Oriented to Place, Oriented to  Time, Oriented to Situation Alcohol / Substance Use: Not Applicable Psych Involvement: No (comment)  Admission diagnosis:  Sickle cell pain crisis [facility]) [D57.00] Patient Active Problem List   Diagnosis Date Noted   Pyelonephritis 10/22/2022   History of pulmonary embolism 10/22/2022   Acute deep vein thrombosis (DVT) of non-extremity vein    Alcohol abuse 05/27/2022   Acute chest syndrome (HCC)    Pulmonary embolism (HCC) 04/14/2022   Sickle cell pain crisis (HCC) 03/26/2022   Hypokalemia 10/29/2021   Lumbar vertebral fracture (HCC) 06/16/2021   Chronic anemia 06/10/2021   Influenza A 06/10/2021   Cocaine use 10/15/2020   S/P cesarean  section 01/09/2019   Yeast vaginitis 12/29/2018   Umbilical vein abnormality affecting pregnancy 12/29/2018   Medication management 12/12/2018   Anxiety and depression 07/15/2018   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/2018   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 07/15/2018   Supervision of  high risk pregnancy in third trimester 07/15/2018   Hyperbilirubinemia 12/25/2017   Leukocytosis 12/25/2017   Menorrhagia 12/25/2017   Thrombocytosis 12/25/2017   Tobacco abuse 12/25/2017   Bipolar and related disorder (HCC) 04/04/2017   Vasovagal syncope 09/18/2016   Closed fracture of lumbar vertebra without spinal cord injury (HCC) 07/15/2016   Sickle cell anemia (HCC) 06/08/2015   Adnexal mass 05/29/2014   Vitamin D deficiency 12/22/2011   Chronic pain 12/21/2011   Patent foramen ovale 12/21/2011   Heart murmur 10/07/2011   Pulmonary hypertension (HCC) 10/07/2011   PCP:  Massie Maroon, FNP Pharmacy:   [facility] - [place], [place] - [address] [place] [address] [place] [address] [place] [address] Phone: [phone] Fax: [phone]  [id number] [place], [place] - [address]  [place] [address] Phone: [phone] Fax: [phone]     Social Determinants of Health (SDOH) Social History: SDOH Screenings   Food Insecurity: No Food Insecurity (01/16/2023)  Housing: Patient Declined (01/16/2023)  Transportation Needs: No Transportation Needs (01/16/2023)  Utilities: Not At Risk (01/16/2023)  Depression (PHQ2-9): Low Risk  (01/05/2023)  Tobacco Use: High Risk (01/14/2023)   SDOH Interventions: Food Insecurity Interventions: Intervention Not Indicated, Inpatient TOC Housing Interventions: Inpatient TOC, Intervention Not Indicated Transportation Interventions: Intervention Not Indicated, Inpatient TOC Utilities Interventions: Intervention Not Indicated, Inpatient TOC   Readmission Risk Interventions    05/31/2022   11:50 AM  Readmission Risk Prevention Plan  Transportation Screening Complete  Medication Review (RN Care Manager) Referral to Pharmacy  PCP or Specialist appointment within 3-5 days of discharge Complete  HRI or Home Care Consult Not Complete  HRI or Home Care Consult Pt  Refusal Comments Not applicable  SW Recovery Care/Counseling Consult Complete  Palliative Care Screening Not Applicable  Skilled Nursing Facility Not Applicable

## 2023-01-16 NOTE — Progress Notes (Signed)
Patient discharged to home with family, discharge instructions reviewed with patient who verbalized understanding. No new medications. 

## 2023-01-16 NOTE — Discharge Summary (Signed)
Physician Discharge Summary   Patient: Sandra Mckay MRN: [id number] DOB: [birth year]  Admit date:     01/13/2023  Discharge date: {dischdate:26783}  Discharge Physician: Lonia Blood   PCP: Massie Maroon, FNP   Recommendations at discharge:  {Tip this will not be part of the note when signed- Example include specific recommendations for outpatient follow-up, pending tests to follow-up on. (Optional):26781}  ***  Discharge Diagnoses: Principal Problem:   Sickle cell pain crisis (HCC) Active Problems:   Anxiety and depression   Chronic pain   History of pulmonary embolism  Resolved Problems:   * No resolved hospital problems. [facility] Course: No notes on file  Assessment and Plan: No notes have been filed under this hospital service. Service: Hospitalist     {Tip this will not be part of the note when signed Body mass index is 23.78 kg/m. , ,  (Optional):26781}  {(NOTE) Pain control PDMP Statment (Optional):26782} Consultants: *** Procedures performed: ***  Disposition: {Plan; Disposition:26390} Diet recommendation:  {Diet_Plan:26776} DISCHARGE MEDICATION: Allergies as of 01/16/2023       Reactions   Latex Rash   Wound Dressing Adhesive Rash        Medication List     TAKE these medications    Benadryl Allergy 25 mg capsule Generic drug: diphenhydrAMINE Take 50 mg by mouth every 6 (six) hours as needed for itching.   ergocalciferol 1.25 MG (50000 UT) capsule Commonly known as: Drisdol Take 1 capsule (50,000 Units total) by mouth once a week.   folic acid 1 MG tablet Commonly known as: FOLVITE Take 1 tablet (1 mg total) by mouth daily.   gabapentin 300 MG capsule Commonly known as: NEURONTIN Take 1 capsule (300 mg total) by mouth 2 (two) times daily.   hydroxyurea 500 MG capsule Commonly known as: HYDREA Take 3 capsules (1,500 mg total) by mouth daily. TAKE 3 CAPSULES (1,500 MG TOTAL) BY MOUTH DAILY.   hydrOXYzine 25 MG  tablet Commonly known as: ATARAX Take 12.5 mg by mouth 2 (two) times daily as needed for itching.   ondansetron 4 MG tablet Commonly known as: Zofran Take 1 tablet (4 mg total) by mouth every 8 (eight) hours as needed for nausea or vomiting.   Oxbryta 500 MG Tabs tablet Generic drug: voxelotor Take 1,000 mg by mouth daily.   oxyCODONE 15 MG immediate release tablet Commonly known as: ROXICODONE Take 1 tablet (15 mg total) by mouth every 6 (six) hours as needed for pain.   QUEtiapine 100 MG tablet Commonly known as: SEROQUEL Take 100 mg by mouth at bedtime.   QUEtiapine 25 MG tablet Commonly known as: SEROQUEL TAKE 1 TABLET BY MOUTH EVERYDAY AT BEDTIME   rivaroxaban 20 MG Tabs tablet Commonly known as: XARELTO Take 1 tablet (20 mg total) by mouth daily with supper.   tiZANidine 4 MG tablet Commonly known as: ZANAFLEX Take 4 mg by mouth every 8 (eight) hours as needed for muscle spasms.   Xtampza ER 13.5 MG C12a Generic drug: oxyCODONE ER Take 13.5 mg by mouth every 12 (twelve) hours for 7 days.        Discharge Exam: Filed Weights   01/13/23 2005  Weight: 59 kg   ***  Condition at discharge: {DC Condition:26389}  The results of significant diagnostics from this hospitalization (including imaging, microbiology, ancillary and laboratory) are listed below for reference.   Imaging Studies: No results found.  Microbiology: Results for orders placed or performed during the hospital encounter of 10/22/22  Urine Culture (for pregnant, neutropenic or urologic patients or patients with an indwelling urinary catheter)     Status: Abnormal   Collection Time: 10/22/22 12:35 PM   Specimen: Urine, Clean Catch  Result Value Ref Range Status   Specimen Description   Final    URINE, CLEAN CATCH Performed at [facility], [address] [place], [place] [address]    Special Requests   Final    NONE Performed at [facility], [address] [place], [place] [address]    Culture >=100,000 COLONIES/mL KLEBSIELLA PNEUMONIAE (A)  Final   Report Status 10/24/2022 FINAL  Final   Organism ID, Bacteria KLEBSIELLA PNEUMONIAE (A)  Final      Susceptibility   Klebsiella pneumoniae - MIC*    AMPICILLIN RESISTANT Resistant     CEFAZOLIN <=4 SENSITIVE Sensitive     CEFEPIME <=0.12 SENSITIVE Sensitive     CEFTRIAXONE <=0.25 SENSITIVE Sensitive     CIPROFLOXACIN <=0.25 SENSITIVE Sensitive     GENTAMICIN <=1 SENSITIVE Sensitive     IMIPENEM <=0.25 SENSITIVE Sensitive     NITROFURANTOIN <=16 SENSITIVE Sensitive     TRIMETH/SULFA <=20 SENSITIVE Sensitive     AMPICILLIN/SULBACTAM 4 SENSITIVE Sensitive     PIP/TAZO <=4 SENSITIVE Sensitive     * >=100,000 COLONIES/mL KLEBSIELLA PNEUMONIAE    Labs: CBC: Recent Labs  Lab 01/11/23 1015 01/13/23 2012 01/15/23 0858  WBC 10.3 12.0* 10.0  NEUTROABS  --  4.2  --   HGB 6.9* 7.5* 6.8*  HCT 19.8* 21.7* 19.2*  MCV 105.9* 106.9* 105.5*  PLT 402* 428* 416*   Basic Metabolic Panel: Recent Labs  Lab 01/13/23 2012  NA 137  K 3.8  CL 106  CO2 25  GLUCOSE 113*  BUN 9  CREATININE 0.67  CALCIUM 9.0   Liver Function Tests: Recent Labs  Lab 01/13/23 2012  AST 35  ALT 35  ALKPHOS 66  BILITOT 2.1*  PROT 7.3  ALBUMIN 4.3   CBG: No results for input(s): "GLUCAP" in the last 168 hours.  Discharge time spent: {LESS THAN/GREATER WGNF:62130} 30 minutes.  SignedLonia Blood, MD [facility] 01/16/2023

## 2023-01-18 ENCOUNTER — Telehealth: Payer: Self-pay

## 2023-01-18 NOTE — Transitions of Care (Post Inpatient/ED Visit) (Signed)
01/18/2023  Name: Sandra Mckay MRN: [id number] DOB: [birth year]  Today's TOC FU Call Status: Today's TOC FU Call Status:: Successful TOC FU Call Competed TOC FU Call Complete Date: 01/18/23  Transition Care Management Follow-up Telephone Call Date of Discharge: 01/16/23 Discharge Facility: [facility]) Type of Discharge: Inpatient Admission Primary Inpatient Discharge Diagnosis:: SS How have you been since you were released from the hospital?: Better Any questions or concerns?: No  Items Reviewed: Did you receive and understand the discharge instructions provided?: Yes Medications obtained,verified, and reconciled?: Yes (Medications Reviewed) Any new allergies since your discharge?: No Dietary orders reviewed?: NA Do you have support at home?: Yes People in Home: parent(s)  Medications Reviewed Today: Medications Reviewed Today     Reviewed by Patience Musca, CPhT (Pharmacy Technician) on 01/14/23 at 0207  Med List Status: Complete   Medication Order Taking? Sig Documenting Provider Last Dose Status Informant  diphenhydrAMINE (BENADRYL ALLERGY) 25 mg capsule [id number] Yes Take 50 mg by mouth every 6 (six) hours as needed for itching. [provider] unk Active Self, Pharmacy Records  ergocalciferol (DRISDOL) 1.25 MG (50000 UT) capsule [id number] Yes Take 1 capsule (50,000 Units total) by mouth once a week. Massie Maroon, FNP 01/13/2023 Active Self, Pharmacy Records           Med Note Rowan Blase Jan 14, 2023  2:06 AM) Lulu Riding  folic acid (FOLVITE) 1 MG tablet [id number] Yes Take 1 tablet (1 mg total) by mouth daily. [name], MD 01/13/2023 Active Self, Pharmacy Records  gabapentin (NEURONTIN) 300 MG capsule [id number] Yes Take 1 capsule (300 mg total) by mouth 2 (two) times daily. Massie Maroon, FNP 01/13/2023 Active Self, Pharmacy Records  hydroxyurea (HYDREA) 500 MG capsule [id number] Yes Take 3 capsules (1,500 mg total) by  mouth daily. TAKE 3 CAPSULES (1,500 MG TOTAL) BY MOUTH DAILY. [name], MD 01/13/2023 Active Self, Pharmacy Records  hydrOXYzine (ATARAX) 25 MG tablet [id number] Yes Take 12.5 mg by mouth 2 (two) times daily as needed for itching. [provider] unk Active Self, Pharmacy Records  ondansetron [facility]) 4 MG tablet [id number] Yes Take 1 tablet (4 mg total) by mouth every 8 (eight) hours as needed for nausea or vomiting. Massie Maroon, FNP unk Active Self, Pharmacy Records  oxyCODONE (ROXICODONE) 15 MG immediate release tablet [id number] Yes Take 1 tablet (15 mg total) by mouth every 6 (six) hours as needed for pain. Massie Maroon, FNP 01/13/2023 Active Self, Pharmacy Records  oxyCODONE ER [facility]) 13.5 MG C12A [id number] Yes Take 13.5 mg by mouth every 12 (twelve) hours for 7 days. Massie Maroon, FNP 01/13/2023 Active Self, Pharmacy Records  QUEtiapine (SEROQUEL) 100 MG tablet [id number] Yes Take 100 mg by mouth at bedtime. [provider] Past Week Active Self, Pharmacy Records  QUEtiapine (SEROQUEL) 25 MG tablet [id number] No TAKE 1 TABLET BY MOUTH EVERYDAY AT BEDTIME  Patient not taking: Reported on 01/05/2023   Massie Maroon, FNP Not Taking Active Self, Pharmacy Records  rivaroxaban (XARELTO) 20 MG TABS tablet [id number] Yes Take 1 tablet (20 mg total) by mouth daily with supper. [name], MD 01/13/2023 1000 Active Self, Pharmacy Records           Med Note Rowan Blase Jan 14, 2023  2:07 AM) Last dispense 4/12 30ds  tiZANidine (ZANAFLEX) 4 MG tablet [id number] Yes Take 4 mg by mouth every 8 (eight) hours as needed  for muscle spasms. [provider] unk Active Self, Pharmacy Records  voxelotor (OXBRYTA) 500 MG TABS tablet [id number] Yes Take 1,000 mg by mouth daily. Massie Maroon, FNP 01/13/2023 Active Self, Pharmacy Records            Home Care and Equipment/Supplies: Were Home Health Services Ordered?: No Any new  equipment or medical supplies ordered?: No  Functional Questionnaire: Do you need assistance with bathing/showering or dressing?: No Do you need assistance with meal preparation?: No Do you need assistance with eating?: No Do you have difficulty maintaining continence: No Do you need assistance with getting out of bed/getting out of a chair/moving?: No Do you have difficulty managing or taking your medications?: No  Follow up appointments reviewed: PCP Follow-up appointment confirmed?: Yes Date of PCP follow-up appointment?: 02/02/23 Follow-up Provider: [facility] Follow-up appointment confirmed?: NA Do you need transportation to your follow-up appointment?: No Do you understand care options if your condition(s) worsen?: Yes-patient verbalized understanding    SIGNATURE TB,CMA

## 2023-01-19 NOTE — Hospital Course (Signed)
Sandra Mckay is a 27 year old female with a medical history significant for sickle cell disease, chronic pain syndrome, opiate dependence and tolerance, history of anemia of chronic disease, and history of bipolar depression was admitted overnight for sickle cell pain crisis. Patient reports severe pain primarily to lower extremities and bilateral hips.  She says that pain was unrelieved by her home medications.  Patient rates pain as 9/10.  She is awaiting bed placement.  She denies headache, chest pain, shortness of breath, urinary symptoms, nausea, vomiting, or diarrhea.  Patient was admitted and started on Dilaudid PCA, Toradol and IV fluids.  Her hemoglobin dropped to 6.8 and she was transfused 1 unit packed red blood cells.  Hemoglobin rose to 7.7.  She was overall stable medically.  Ultimately felt better pain was down to 3 out of 10.  Patient was then discharged home to follow-up with PCP.

## 2023-01-25 ENCOUNTER — Ambulatory Visit (INDEPENDENT_AMBULATORY_CARE_PROVIDER_SITE_OTHER): Payer: Medicaid Other | Admitting: Clinical

## 2023-01-25 DIAGNOSIS — F319 Bipolar disorder, unspecified: Secondary | ICD-10-CM | POA: Diagnosis not present

## 2023-01-25 DIAGNOSIS — F419 Anxiety disorder, unspecified: Secondary | ICD-10-CM

## 2023-01-25 NOTE — BH Specialist Note (Unsigned)
Some family feels like triggers, makes me want to use Any negative emotion makes me want to use Mom still holds on to her meds Only had long acting over beach weekend Feel good to say it, but doesn't feel good to not be in control of meds myself Hope to do it by myself soon, within the next couple months Is taking oxbryta  [place] from [employer] to stay with PCP [place] but get hematology down there [facility] psych not connected Taking doxepin from last psych - makes her sleep Ok with seroquel and doxepin Sleeping better with doxepin  Mom kidney failure, will start dialysis at home, already on transplant list I don't want her to worry about me I wish we weren't having to have these conversations  Spend some time looking into music/Djing this week

## 2023-01-27 ENCOUNTER — Other Ambulatory Visit: Payer: Self-pay

## 2023-01-27 ENCOUNTER — Telehealth: Payer: Self-pay | Admitting: Family Medicine

## 2023-01-27 ENCOUNTER — Other Ambulatory Visit: Payer: Self-pay | Admitting: Family Medicine

## 2023-01-27 DIAGNOSIS — G894 Chronic pain syndrome: Secondary | ICD-10-CM

## 2023-01-27 DIAGNOSIS — D571 Sickle-cell disease without crisis: Secondary | ICD-10-CM

## 2023-01-27 MED ORDER — OXYCODONE HCL 15 MG PO TABS
15.0000 mg | ORAL_TABLET | Freq: Four times a day (QID) | ORAL | 0 refills | Status: DC | PRN
Start: 2023-01-31 — End: 2023-02-10

## 2023-01-27 MED ORDER — XTAMPZA ER 13.5 MG PO C12A
1.0000 | EXTENDED_RELEASE_CAPSULE | Freq: Two times a day (BID) | ORAL | 0 refills | Status: DC
Start: 2023-01-27 — End: 2023-02-10

## 2023-01-27 NOTE — Telephone Encounter (Signed)
Caller & Relationship to patient:  MRN #  [id number]   Call Back Number: [phone]  Date of Last Office Visit: 01/27/2023     Date of Next Office Visit: 02/02/2023    Medication(s) to be Refilled: Marlowe Kays - needs a 7-day supply in order for insurance to pay for it  [facility]  ** Please notify patient to allow 48-72 hours to process** **Let patient know to contact pharmacy at the end of the day to make sure medication is ready. ** **If patient has not been seen in a year or longer, book an appointment **Advise to use MyChart for refill requests OR to contact their pharmacy

## 2023-01-27 NOTE — Telephone Encounter (Signed)
Please advise KH 

## 2023-01-27 NOTE — Progress Notes (Signed)
Reviewed PDMP substance reporting system prior to prescribing opiate medications. No inconsistencies noted.  Meds ordered this encounter  Medications   oxyCODONE (ROXICODONE) 15 MG immediate release tablet    Sig: Take 1 tablet (15 mg total) by mouth every 6 (six) hours as needed for pain.    Dispense:  60 tablet    Refill:  0    Order Specific Question:   Supervising Provider    Answer:   [name], [name]   Sandra Mckay Sandra Lundberg  APRN, MSN, FNP-C [facility] Care [facility] [address]  [place], [place] [address] [phone]  

## 2023-01-27 NOTE — Progress Notes (Signed)
Reviewed PDMP substance reporting system prior to prescribing opiate medications. No inconsistencies noted.  Meds ordered this encounter  Medications   oxyCODONE ER (XTAMPZA ER) 13.5 MG C12A    Sig: Take 1 capsule by mouth every 12 (twelve) hours.    Dispense:  14 capsule    Refill:  0    Order Specific Question:   Supervising Provider    Answer:   [name] [[id number]]   [name]  APRN, MSN, FNP-C Patient Care [facility] [address] [place], [place] [address] [phone]

## 2023-01-27 NOTE — Telephone Encounter (Signed)
From: [name] To: Office of Julianne Handler, [place] Sent: 01/27/2023 8:05 AM EDT Subject: Medication Renewal Request  Refills have been requested for the following medications:   oxyCODONE (ROXICODONE) 15 MG immediate release tablet [Lachina Hollis]  Preferred pharmacy: [id number] [place], [place] - [address] Delivery method: [employer]

## 2023-02-02 ENCOUNTER — Encounter: Payer: Self-pay | Admitting: Family Medicine

## 2023-02-02 ENCOUNTER — Ambulatory Visit (INDEPENDENT_AMBULATORY_CARE_PROVIDER_SITE_OTHER): Payer: Medicaid Other | Admitting: Family Medicine

## 2023-02-02 VITALS — BP 93/53 | HR 85 | Temp 98.4°F | Wt 129.0 lb

## 2023-02-02 DIAGNOSIS — Z09 Encounter for follow-up examination after completed treatment for conditions other than malignant neoplasm: Secondary | ICD-10-CM | POA: Diagnosis not present

## 2023-02-02 DIAGNOSIS — E559 Vitamin D deficiency, unspecified: Secondary | ICD-10-CM | POA: Diagnosis not present

## 2023-02-02 DIAGNOSIS — D571 Sickle-cell disease without crisis: Secondary | ICD-10-CM | POA: Diagnosis not present

## 2023-02-02 DIAGNOSIS — G894 Chronic pain syndrome: Secondary | ICD-10-CM | POA: Diagnosis not present

## 2023-02-02 NOTE — Progress Notes (Signed)
Established Patient Office Visit  Subjective   Patient ID: Sandra Mckay, female    DOB: 24-Jun-1996  Age: 27 y.o. MRN: [id number]  Chief Complaint  Patient presents with   Sickle Cell Anemia    Sandra Mckay is a very pleasant 27 year old female with a medical history significant for sickle cell disease, chronic pain syndrome, opiate dependence and tolerance, and history of anemia of chronic disease presents for posthospital follow-up.  Patient was hospitalized from 6/12 - 6-15 for sickle cell pain crisis.  Patient was treated with IV Dilaudid, IV fluids, and IV Toradol.  Pain intensity returned to baseline prior to discharge.  She states that she has been doing well since discharge.  She has her opiate medications at home which have been controlling her pain.  Patient last had Xtampza and oxycodone this a.m. with moderate relief.  Her chronic pain is primarily in her back and lower extremities.  Patient rates pain as 4/10.  She denies any headache, blurry vision, chest pain, or dizziness.  No fever, chills, or shortness of breath.  No nausea, vomiting, or diarrhea.    Patient Active Problem List   Diagnosis Date Noted   Pyelonephritis 10/22/2022   History of pulmonary embolism 10/22/2022   Acute deep vein thrombosis (DVT) of non-extremity vein    Alcohol abuse 05/27/2022   Acute chest syndrome [facility])    Pulmonary embolism (HCC) 04/14/2022   Sickle cell pain crisis (HCC) 03/26/2022   Hypokalemia 10/29/2021   Lumbar vertebral fracture (HCC) 06/16/2021   Chronic anemia 06/10/2021   Influenza A 06/10/2021   Cocaine use 10/15/2020   S/P cesarean section 01/09/2019   Yeast vaginitis 12/29/2018   Umbilical vein abnormality affecting pregnancy 12/29/2018   Medication management 12/12/2018   Anxiety and depression 07/15/2018   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/2018   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 07/15/2018   Supervision of high risk pregnancy in third  trimester 07/15/2018   Hyperbilirubinemia 12/25/2017   Leukocytosis 12/25/2017   Menorrhagia 12/25/2017   Thrombocytosis 12/25/2017   Tobacco abuse 12/25/2017   Bipolar and related disorder (HCC) 04/04/2017   Vasovagal syncope 09/18/2016   Closed fracture of lumbar vertebra without spinal cord injury (HCC) 07/15/2016   Sickle cell anemia (HCC) 06/08/2015   Adnexal mass 05/29/2014   Vitamin D deficiency 12/22/2011   Chronic pain 12/21/2011   Patent foramen ovale 12/21/2011   Heart murmur 10/07/2011   Pulmonary hypertension (HCC) 10/07/2011   Past Medical History:  Diagnosis Date   Acute cystitis without hematuria 10/21/2019   Blood transfusion without reported diagnosis    Bronchitis    Chickenpox    Depression    Elevated ferritin level 05/2019   Heart murmur    Nausea without vomiting 09/09/2015   Pulmonary hypertension (HCC)    Sickle cell anemia (HCC)    Sickle cell disease, type SS (HCC)    Sickle cell pain crisis (HCC) 12/05/2016   Thrombocytosis 05/2019   Urinary tract infection    Vitamin D deficiency    Past Surgical History:  Procedure Laterality Date   CHOLECYSTECTOMY  2011   EYE SURGERY     Sty removal   IR IMAGING GUIDED PORT INSERTION  03/06/2022   IR REMOVAL TUN ACCESS W/ PORT W/O FL MOD SED  05/29/2022   LABIAL ADHESION LYSIS  1999   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   TONSILLECTOMY  2012   Social History  Tobacco Use   Smoking status: Some Days    Packs/day: 1    Types: Cigarettes   Smokeless tobacco: Never   Tobacco comments:    1 pack twice a week  Vaping Use   Vaping Use: Never used  Substance Use Topics   Alcohol use: Yes    Alcohol/week: 0.0 standard drinks of alcohol    Comment: occ   Drug use: Yes    Types: Marijuana    Comment: sometimes   Social History   Socioeconomic History   Marital status: Single    Spouse name: Not on file   Number of children: Not on file   Years of education: Not on  file   Highest education level: Not on file  Occupational History   Not on file  Tobacco Use   Smoking status: Some Days    Packs/day: 1    Types: Cigarettes   Smokeless tobacco: Never   Tobacco comments:    1 pack twice a week  Vaping Use   Vaping Use: Never used  Substance and Sexual Activity   Alcohol use: Yes    Alcohol/week: 0.0 standard drinks of alcohol    Comment: occ   Drug use: Yes    Types: Marijuana    Comment: sometimes   Sexual activity: Yes  Other Topics Concern   Not on file  Social History Narrative   Lives with mom in a one story home.  Education: 2 years of college.    Social Determinants of Health   Financial Resource Strain: Not on file  Food Insecurity: No Food Insecurity (01/16/2023)   Hunger Vital Sign    Worried About Running Out of Food in the Last Year: Never true    Ran Out of Food in the Last Year: Never true  Transportation Needs: No Transportation Needs (01/16/2023)   PRAPARE - Administrator, Civil Service (Medical): No    Lack of Transportation (Non-Medical): No  Physical Activity: Not on file  Stress: Not on file  Social Connections: Not on file  Intimate Partner Violence: Not At Risk (01/16/2023)   Humiliation, Afraid, Rape, and Kick questionnaire    Fear of Current or Ex-Partner: No    Emotionally Abused: No    Physically Abused: No    Sexually Abused: No   Family Status  Relation Name Status   Mother  Alive   Other  (Not Specified)   Other  (Not Specified)   Family History  Problem Relation Age of Onset   Arthritis Other        grandparent   Stroke Other    Hypertension Other    Diabetes Other        grandparent   Cancer - Other Other        Glioblastoma   Allergies  Allergen Reactions   Latex Rash   Wound Dressing Adhesive Rash      Review of Systems  Constitutional:  Negative for chills and fever.  HENT: Negative.    Eyes: Negative.   Respiratory: Negative.    Cardiovascular: Negative.    Genitourinary: Negative.   Musculoskeletal:  Positive for back pain and joint pain.  Skin: Negative.   Neurological: Negative.   Psychiatric/Behavioral: Negative.        Objective:     BP (!) 93/53   Pulse 85   Temp 98.4 F (36.9 C)   Wt 129 lb (58.5 kg)   LMP 12/28/2022   SpO2 100%   BMI  23.59 kg/m  BP Readings from Last 3 Encounters:  02/02/23 (!) 93/53  01/16/23 103/85  01/11/23 (!) 112/58   Wt Readings from Last 3 Encounters:  02/02/23 129 lb (58.5 kg)  01/13/23 130 lb (59 kg)  01/05/23 133 lb 12.8 oz (60.7 kg)     Physical Exam Constitutional:      Appearance: Normal appearance.  Eyes:     Pupils: Pupils are equal, round, and reactive to light.  Cardiovascular:     Rate and Rhythm: Normal rate and regular rhythm.     Pulses: Normal pulses.  Abdominal:     General: Bowel sounds are normal.  Musculoskeletal:        General: Normal range of motion.  Skin:    General: Skin is warm.  Neurological:     General: No focal deficit present.     Mental Status: She is alert. Mental status is at baseline.  Psychiatric:        Mood and Affect: Mood normal.        Behavior: Behavior normal.        Thought Content: Thought content normal.        Judgment: Judgment normal.      No results found for any visits on 02/02/23.  Last CBC Lab Results  Component Value Date   WBC 9.1 01/16/2023   HGB 7.7 (L) 01/16/2023   HCT 21.1 (L) 01/16/2023   MCV 97.7 01/16/2023   MCH 35.6 (H) 01/16/2023   RDW 23.4 (H) 01/16/2023   PLT 269 01/16/2023   Last metabolic panel Lab Results  Component Value Date   GLUCOSE 84 01/16/2023   NA 136 01/16/2023   K 4.2 01/16/2023   CL 104 01/16/2023   CO2 23 01/16/2023   BUN 9 01/16/2023   CREATININE 0.39 (L) 01/16/2023   GFRNONAA >60 01/16/2023   CALCIUM 8.7 (L) 01/16/2023   PHOS 4.2 05/27/2022   PROT 6.3 (L) 01/16/2023   ALBUMIN 3.9 01/16/2023   LABGLOB 2.4 11/03/2022   AGRATIO 1.8 11/03/2022   BILITOT 2.9 (H) 01/16/2023    ALKPHOS 96 01/16/2023   AST 65 (H) 01/16/2023   ALT 71 (H) 01/16/2023   ANIONGAP 9 01/16/2023   Last lipids No results found for: "CHOL", "HDL", "LDLCALC", "LDLDIRECT", "TRIG", "CHOLHDL" Last hemoglobin A1c No results found for: "HGBA1C" Last thyroid functions No results found for: "TSH", "T3TOTAL", "T4TOTAL", "THYROIDAB" Last vitamin D Lab Results  Component Value Date   VD25OH 8.5 (L) 11/03/2022   Last vitamin B12 and Folate Lab Results  Component Value Date   FOLATE 5.5 01/14/2022      The ASCVD Risk score (Arnett DK, et al., 2019) failed to calculate for the following reasons:   The 2019 ASCVD risk score is only valid for ages 61 to 81    Assessment & Plan:   Problem List Items Addressed This Visit       Other   Vitamin D deficiency   Relevant Orders   Sickle Cell Panel   [id number] 11+Oxyco+Alc+Crt-Bund   Hb-SS disease without crisis [facility]) - Primary   Relevant Orders   Sickle Cell Panel   [id number] 11+Oxyco+Alc+Crt-Bund   Chronic pain   Relevant Orders   [id number] 11+Oxyco+Alc+Crt-Bund   Other Visit Diagnoses     Hospital discharge follow-up          1. Hospital discharge follow-up Patient has been doing well since hospital discharge.  No additional needs identified.  Patient has opiate medications at home for pain  control.  She will continue folic acid, hydroxyurea, and Oxbryta.  2. Hb-SS disease without crisis (HCC)  - Sickle Cell Panel - [id number] 11+Oxyco+Alc+Crt-Bund  3. Chronic pain syndrome  - [id number] 11+Oxyco+Alc+Crt-Bund  4. Vitamin D deficiency  - Sickle Cell Panel - [id number] 11+Oxyco+Alc+Crt-Bund   Return in about 3 months (around 05/05/2023) for sickle cell anemia.   [name]  APRN, MSN, FNP-C Patient Care [facility] [address] [place], [place] [address] [phone]

## 2023-02-03 LAB — CMP14+CBC/D/PLT+FER+RETIC+V...
BUN/Creatinine Ratio: 11 (ref 9–23)
BUN: 7 mg/dL (ref 6–20)
Basos: 1 %
Bilirubin Total: 2.3 mg/dL — ABNORMAL HIGH (ref 0.0–1.2)
Calcium: 10 mg/dL (ref 8.7–10.2)
Creatinine, Ser: 0.64 mg/dL (ref 0.57–1.00)
EOS (ABSOLUTE): 0.1 10*3/uL (ref 0.0–0.4)
Globulin, Total: 2.5 g/dL (ref 1.5–4.5)
MCH: 35.9 pg — ABNORMAL HIGH (ref 26.6–33.0)
Neutrophils Absolute: 5.9 10*3/uL (ref 1.4–7.0)
RBC: 2.34 x10E6/uL — CL (ref 3.77–5.28)
WBC: 11.4 10*3/uL — ABNORMAL HIGH (ref 3.4–10.8)
eGFR: 123 mL/min/{1.73_m2} (ref >59)

## 2023-02-04 LAB — DRUG SCREEN 764883 11+OXYCO+ALC+CRT-BUND

## 2023-02-04 LAB — CMP14+CBC/D/PLT+FER+RETIC+V...
Albumin: 4.9 g/dL (ref 4.0–5.0)
Alkaline Phosphatase: 106 IU/L (ref 44–121)
Chloride: 103 mmol/L (ref 96–106)
Eos: 1 %
Ferritin: 3689 ng/mL — ABNORMAL HIGH (ref 15–150)
Immature Grans (Abs): 0 10*3/uL (ref 0.0–0.1)
Immature Granulocytes: 0 %
MCV: 106 fL — ABNORMAL HIGH (ref 79–97)
Platelets: 492 10*3/uL — ABNORMAL HIGH (ref 150–450)
Potassium: 4.6 mmol/L (ref 3.5–5.2)
Retic Ct Pct: 13.3 % — ABNORMAL HIGH (ref 0.6–2.6)
Sodium: 140 mmol/L (ref 134–144)
Total Protein: 7.4 g/dL (ref 6.0–8.5)

## 2023-02-05 LAB — CMP14+CBC/D/PLT+FER+RETIC+V...
ALT: 31 IU/L (ref 0–32)
AST: 44 IU/L — ABNORMAL HIGH (ref 0–40)
Basophils Absolute: 0.1 10*3/uL (ref 0.0–0.2)
CO2: 16 mmol/L — ABNORMAL LOW (ref 20–29)
Glucose: 94 mg/dL (ref 70–99)
Hematocrit: 24.9 % — ABNORMAL LOW (ref 34.0–46.6)
Hemoglobin: 8.4 g/dL — ABNORMAL LOW (ref 11.1–15.9)
Lymphocytes Absolute: 3.6 10*3/uL — ABNORMAL HIGH (ref 0.7–3.1)
Lymphs: 32 %
MCHC: 33.7 g/dL (ref 31.5–35.7)
Monocytes Absolute: 1.8 10*3/uL — ABNORMAL HIGH (ref 0.1–0.9)
Monocytes: 15 %
Neutrophils: 51 %
RDW: 22.6 % — ABNORMAL HIGH (ref 11.7–15.4)

## 2023-02-07 LAB — DRUG SCREEN 764883 11+OXYCO+ALC+CRT-BUND
Amphetamines, Urine: NEGATIVE ng/mL
Barbiturate: NEGATIVE ng/mL
Cocaine (Metabolite): NEGATIVE ng/mL
Creatinine: 124 mg/dL (ref 20.0–300.0)
Ethanol: NEGATIVE %
Methadone Screen, Urine: NEGATIVE ng/mL
Propoxyphene: NEGATIVE ng/mL
Tramadol: NEGATIVE ng/mL
pH, Urine: 6.7 (ref 4.5–8.9)

## 2023-02-07 LAB — CANNABINOID CONFIRMATION, UR
CANNABINOIDS: POSITIVE — AB
Carboxy THC GC/MS Conf: 334 ng/mL

## 2023-02-07 LAB — OXYCODONE/OXYMORPHONE, CONFIRM
OXYCODONE/OXYMORPH: POSITIVE — AB
OXYCODONE: >3000 ng/mL
OXYCODONE: POSITIVE — AB
OXYMORPHONE (GC/MS): >3000 ng/mL
OXYMORPHONE: POSITIVE — AB

## 2023-02-07 LAB — OPIATES CONFIRMATION, URINE: Opiates: NEGATIVE ng/mL

## 2023-02-08 ENCOUNTER — Ambulatory Visit (INDEPENDENT_AMBULATORY_CARE_PROVIDER_SITE_OTHER): Payer: Medicaid Other | Admitting: Clinical

## 2023-02-08 DIAGNOSIS — F319 Bipolar disorder, unspecified: Secondary | ICD-10-CM | POA: Diagnosis not present

## 2023-02-08 DIAGNOSIS — F419 Anxiety disorder, unspecified: Secondary | ICD-10-CM
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
## 2023-02-10 ENCOUNTER — Telehealth (HOSPITAL_COMMUNITY): Payer: Self-pay

## 2023-02-10 ENCOUNTER — Other Ambulatory Visit: Payer: Self-pay | Admitting: Family Medicine

## 2023-02-10 ENCOUNTER — Non-Acute Institutional Stay (HOSPITAL_COMMUNITY)
Admission: AD | Admit: 2023-02-10 | Discharge: 2023-02-10 | Disposition: A | Discharge status: Home / Self Care | Payer: Medicaid Other | Attending: Internal Medicine | Admitting: Internal Medicine

## 2023-02-10 ENCOUNTER — Telehealth: Payer: Self-pay | Admitting: Family Medicine

## 2023-02-10 DIAGNOSIS — G894 Chronic pain syndrome: Secondary | ICD-10-CM | POA: Diagnosis not present

## 2023-02-10 DIAGNOSIS — D571 Sickle-cell disease without crisis: Secondary | ICD-10-CM

## 2023-02-10 DIAGNOSIS — F1120 Opioid dependence, uncomplicated: Secondary | ICD-10-CM | POA: Diagnosis not present

## 2023-02-10 DIAGNOSIS — D638 Anemia in other chronic diseases classified elsewhere: Secondary | ICD-10-CM | POA: Insufficient documentation

## 2023-02-10 DIAGNOSIS — F32A Depression, unspecified: Secondary | ICD-10-CM | POA: Diagnosis not present

## 2023-02-10 DIAGNOSIS — Z79899 Other long term (current) drug therapy: Secondary | ICD-10-CM | POA: Diagnosis not present

## 2023-02-10 DIAGNOSIS — D5700 Hb-SS disease with crisis, unspecified: Secondary | ICD-10-CM | POA: Diagnosis present

## 2023-02-10 DIAGNOSIS — F17210 Nicotine dependence, cigarettes, uncomplicated: Secondary | ICD-10-CM | POA: Insufficient documentation

## 2023-02-10 DIAGNOSIS — Z7901 Long term (current) use of anticoagulants: Secondary | ICD-10-CM | POA: Diagnosis not present

## 2023-02-10 LAB — COMPREHENSIVE METABOLIC PANEL
ALT: 37 U/L (ref 0–44)
AST: 34 U/L (ref 15–41)
Albumin: 4.3 g/dL (ref 3.5–5.0)
Alkaline Phosphatase: 65 U/L (ref 38–126)
Anion gap: 8 (ref 5–15)
BUN: 7 mg/dL (ref 6–20)
CO2: 23 mmol/L (ref 22–32)
Calcium: 9.1 mg/dL (ref 8.9–10.3)
Chloride: 108 mmol/L (ref 98–111)
Creatinine, Ser: 0.43 mg/dL — ABNORMAL LOW (ref 0.44–1.00)
GFR, Estimated: >60 mL/min (ref >60)
Glucose, Bld: 89 mg/dL (ref 70–99)
Potassium: 3.5 mmol/L (ref 3.5–5.1)
Sodium: 139 mmol/L (ref 135–145)
Total Bilirubin: 1.8 mg/dL — ABNORMAL HIGH (ref 0.3–1.2)
Total Protein: 6.8 g/dL (ref 6.5–8.1)

## 2023-02-10 LAB — CBC WITH DIFFERENTIAL/PLATELET
Abs Immature Granulocytes: 0.02 10*3/uL (ref 0.00–0.07)
Basophils Absolute: 0.1 10*3/uL (ref 0.0–0.1)
Basophils Relative: 1 %
Eosinophils Absolute: 0.1 10*3/uL (ref 0.0–0.5)
Eosinophils Relative: 1 %
HCT: 21.7 % — ABNORMAL LOW (ref 36.0–46.0)
Hemoglobin: 7.5 g/dL — ABNORMAL LOW (ref 12.0–15.0)
Immature Granulocytes: 0 %
Lymphocytes Relative: 34 %
Lymphs Abs: 2.4 10*3/uL (ref 0.7–4.0)
MCH: 36.2 pg — ABNORMAL HIGH (ref 26.0–34.0)
MCHC: 34.6 g/dL (ref 30.0–36.0)
MCV: 104.8 fL — ABNORMAL HIGH (ref 80.0–100.0)
Monocytes Absolute: 1.2 10*3/uL — ABNORMAL HIGH (ref 0.1–1.0)
Monocytes Relative: 17 %
Neutro Abs: 3.3 10*3/uL (ref 1.7–7.7)
Neutrophils Relative %: 47 %
Platelets: 415 10*3/uL — ABNORMAL HIGH (ref 150–400)
RBC: 2.07 MIL/uL — ABNORMAL LOW (ref 3.87–5.11)
RDW: 23.1 % — ABNORMAL HIGH (ref 11.5–15.5)
WBC: 7.1 10*3/uL (ref 4.0–10.5)
nRBC: 1.7 % — ABNORMAL HIGH (ref 0.0–0.2)

## 2023-02-10 LAB — RETICULOCYTES
Immature Retic Fract: 31.3 % — ABNORMAL HIGH (ref 2.3–15.9)
RBC.: 2.07 MIL/uL — ABNORMAL LOW (ref 3.87–5.11)
Retic Count, Absolute: 485 10*3/uL — ABNORMAL HIGH (ref 19.0–186.0)
Retic Ct Pct: 23.7 % — ABNORMAL HIGH (ref 0.4–3.1)

## 2023-02-10 MED ORDER — SODIUM CHLORIDE 0.45 % IV SOLN: INTRAVENOUS | Status: DC

## 2023-02-10 MED ORDER — OXYCODONE HCL 15 MG PO TABS
15.0000 mg | ORAL_TABLET | Freq: Four times a day (QID) | ORAL | 0 refills | Status: DC | PRN
Start: 2023-02-15 — End: 2023-02-25

## 2023-02-10 MED ORDER — NALOXONE HCL 0.4 MG/ML IJ SOLN: 0.4000 mg | INTRAMUSCULAR | Status: DC | PRN

## 2023-02-10 MED ORDER — KETOROLAC TROMETHAMINE 30 MG/ML IJ SOLN
15.0000 mg | Freq: Once | INTRAMUSCULAR | Status: AC
  Administered 2023-02-10: 15 mg via INTRAVENOUS
  Filled 2023-02-10: qty 1

## 2023-02-10 MED ORDER — ACETAMINOPHEN 500 MG PO TABS
1000.0000 mg | ORAL_TABLET | Freq: Once | ORAL | Status: AC
  Administered 2023-02-10: 1000 mg via ORAL
  Filled 2023-02-10: qty 2

## 2023-02-10 MED ORDER — XTAMPZA ER 13.5 MG PO C12A
1.0000 | EXTENDED_RELEASE_CAPSULE | Freq: Two times a day (BID) | ORAL | 0 refills | Status: DC
Start: 2023-02-10 — End: 2023-02-25

## 2023-02-10 MED ORDER — DIPHENHYDRAMINE HCL 25 MG PO CAPS
25.0000 mg | ORAL_CAPSULE | ORAL | Status: DC | PRN
  Administered 2023-02-10: 25 mg via ORAL
  Filled 2023-02-10: qty 1

## 2023-02-10 MED ORDER — HYDROMORPHONE 1 MG/ML IV SOLN
INTRAVENOUS | Status: DC
  Administered 2023-02-10: 10 mg via INTRAVENOUS
  Administered 2023-02-10: 30 mg via INTRAVENOUS
  Filled 2023-02-10: qty 30

## 2023-02-10 MED ORDER — ONDANSETRON HCL 4 MG/2ML IJ SOLN
4.0000 mg | Freq: Four times a day (QID) | INTRAMUSCULAR | Status: DC | PRN
  Administered 2023-02-10: 4 mg via INTRAVENOUS
  Filled 2023-02-10: qty 2

## 2023-02-10 MED ORDER — SODIUM CHLORIDE 0.9% FLUSH: 9.0000 mL | INTRAVENOUS | Status: DC | PRN

## 2023-02-10 NOTE — Telephone Encounter (Signed)
Caller & Relationship to patient:  MRN #  [id number]   Call Back Number:   Date of Last Office Visit: 02/02/2023     Date of Next Office Visit: 02/22/2023    Medication(s) to be Refilled: xtampza 13.5 due today Oxycodone 15 mg due Monday   Preferred Pharmacy:   ** Please notify patient to allow 48-72 hours to process** **Let patient know to contact pharmacy at the end of the day to make sure medication is ready. ** **If patient has not been seen in a year or longer, book an appointment **Advise to use MyChart for refill requests OR to contact their pharmacy

## 2023-02-10 NOTE — Progress Notes (Signed)
Reviewed PDMP substance reporting system prior to prescribing opiate medications. No inconsistencies noted.  Meds ordered this encounter  Medications   oxyCODONE ER (XTAMPZA ER) 13.5 MG C12A    Sig: Take 1 capsule by mouth every 12 (twelve) hours.    Dispense:  14 capsule    Refill:  0    Order Specific Question:   Supervising Provider    Answer:   [name] [[id number]]   oxyCODONE (ROXICODONE) 15 MG immediate release tablet    Sig: Take 1 tablet (15 mg total) by mouth every 6 (six) hours as needed for pain.    Dispense:  60 tablet    Refill:  0    Order Specific Question:   Supervising Provider    Answer:   [name] [[id number]]   [name]  APRN, MSN, FNP-C Patient Care [facility] [address] [place], [place] [address] [phone]

## 2023-02-10 NOTE — H&P (Signed)
[facility] History and Physical   Date: 02/10/2023  Patient name: Sandra Mckay Medical record number: [id number] Date of birth: Jul 15, 1996 Age: 27 y.o. Gender: female PCP: Massie Maroon, FNP  Attending physician: [name], MD  Chief Complaint: Sickle cell pain   History of Present Illness: Sandra Mckay is a 27 year old female with a medical history significant for sickle cell disease, chronic pain syndrome, opiate dependence and tolerance, and history of anemia of chronic disease that presents with complaints of generalized pain that is consistent with her typical sickle cell crisis.  Patient states that pain intensity has been elevated over the past several days and unrelieved by her home Xtampza and oxycodone, which was last taken this a.m.  Patient did not have very much relief from her home medication regimen.  She attributes pain crisis to moving into a new home in 90 degree weather.  She denies fever, chills, chest pain, or shortness of breath.  No urinary symptoms, nausea, vomiting, or diarrhea.  No sick contacts, recent travel, or known exposure to COVID-19.  Meds: Medications Prior to Admission  Medication Sig Dispense Refill Last Dose   diphenhydrAMINE (BENADRYL ALLERGY) 25 mg capsule Take 50 mg by mouth every 6 (six) hours as needed for itching. (Patient not taking: Reported on 02/02/2023)      ergocalciferol (DRISDOL) 1.25 MG (50000 UT) capsule Take 1 capsule (50,000 Units total) by mouth once a week. 12 capsule 2    folic acid (FOLVITE) 1 MG tablet Take 1 tablet (1 mg total) by mouth daily. 90 tablet 3    gabapentin (NEURONTIN) 300 MG capsule Take 1 capsule (300 mg total) by mouth 2 (two) times daily. 60 capsule 5    hydroxyurea (HYDREA) 500 MG capsule Take 3 capsules (1,500 mg total) by mouth daily. TAKE 3 CAPSULES (1,500 MG TOTAL) BY MOUTH DAILY. (Patient not taking: Reported on 02/02/2023) 270 capsule 1    hydrOXYzine (ATARAX) 25 MG tablet  Take 12.5 mg by mouth 2 (two) times daily as needed for itching.      ondansetron (ZOFRAN) 4 MG tablet Take 1 tablet (4 mg total) by mouth every 8 (eight) hours as needed for nausea or vomiting. 30 tablet 3    oxyCODONE (ROXICODONE) 15 MG immediate release tablet Take 1 tablet (15 mg total) by mouth every 6 (six) hours as needed for pain. 60 tablet 0    oxyCODONE ER (XTAMPZA ER) 13.5 MG C12A Take 1 capsule by mouth every 12 (twelve) hours. 14 capsule 0    QUEtiapine (SEROQUEL) 100 MG tablet Take 100 mg by mouth at bedtime.      QUEtiapine (SEROQUEL) 25 MG tablet TAKE 1 TABLET BY MOUTH EVERYDAY AT BEDTIME (Patient not taking: Reported on 01/05/2023) 90 tablet 3    rivaroxaban (XARELTO) 20 MG TABS tablet Take 1 tablet (20 mg total) by mouth daily with supper. 30 tablet 3    tiZANidine (ZANAFLEX) 4 MG tablet Take 4 mg by mouth every 8 (eight) hours as needed for muscle spasms.      voxelotor (OXBRYTA) 500 MG TABS tablet Take 1,000 mg by mouth daily. 90 tablet 2     Allergies: Latex and Wound dressing adhesive Past Medical History:  Diagnosis Date   Acute cystitis without hematuria 10/21/2019   Blood transfusion without reported diagnosis    Bronchitis    Chickenpox    Depression    Elevated ferritin level 05/2019   Heart murmur    Nausea without vomiting 09/09/2015  Pulmonary hypertension (HCC)    Sickle cell anemia (HCC)    Sickle cell disease, type SS (HCC)    Sickle cell pain crisis (HCC) 12/05/2016   Thrombocytosis 05/2019   Urinary tract infection    Vitamin D deficiency    Past Surgical History:  Procedure Laterality Date   CHOLECYSTECTOMY  2011   EYE SURGERY     Sty removal   IR IMAGING GUIDED PORT INSERTION  03/06/2022   IR REMOVAL TUN ACCESS W/ PORT W/O FL MOD SED  05/29/2022   LABIAL ADHESION LYSIS  1999   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   TONSILLECTOMY  2012   Family History  Problem Relation Age of Onset   Arthritis Other         grandparent   Stroke Other    Hypertension Other    Diabetes Other        grandparent   Cancer - Other Other        Glioblastoma   Social History   Socioeconomic History   Marital status: Single    Spouse name: Not on file   Number of children: Not on file   Years of education: Not on file   Highest education level: Not on file  Occupational History   Not on file  Tobacco Use   Smoking status: Some Days    Packs/day: 1    Types: Cigarettes   Smokeless tobacco: Never   Tobacco comments:    1 pack twice a week  Vaping Use   Vaping Use: Never used  Substance and Sexual Activity   Alcohol use: Yes    Alcohol/week: 0.0 standard drinks of alcohol    Comment: occ   Drug use: Yes    Types: Marijuana    Comment: sometimes   Sexual activity: Yes  Other Topics Concern   Not on file  Social History Narrative   Lives with mom in a one story home.  Education: 2 years of college.    Social Determinants of Health   Financial Resource Strain: Not on file  Food Insecurity: No Food Insecurity (01/16/2023)   Hunger Vital Sign    Worried About Running Out of Food in the Last Year: Never true    Ran Out of Food in the Last Year: Never true  Transportation Needs: No Transportation Needs (01/16/2023)   PRAPARE - Administrator, Civil Service (Medical): No    Lack of Transportation (Non-Medical): No  Physical Activity: Not on file  Stress: Not on file  Social Connections: Not on file  Intimate Partner Violence: Not At Risk (01/16/2023)   Humiliation, Afraid, Rape, and Kick questionnaire    Fear of Current or Ex-Partner: No    Emotionally Abused: No    Physically Abused: No    Sexually Abused: No   Review of Systems  Constitutional:  Negative for chills and fever.  HENT: Negative.    Respiratory: Negative.    Cardiovascular: Negative.   Gastrointestinal: Negative.   Genitourinary: Negative.   Musculoskeletal:  Positive for back pain and joint pain.  Skin: Negative.    Neurological: Negative.   Psychiatric/Behavioral: Negative.      Physical Exam: Blood pressure 119/70, pulse 79, temperature 98.5 F (36.9 C), temperature source Temporal, resp. rate 16, last menstrual period 12/28/2022, SpO2 100 %. Physical Exam Eyes:     Pupils: Pupils are equal, round, and reactive to light.  Cardiovascular:  Rate and Rhythm: Normal rate and regular rhythm.  Pulmonary:     Effort: Pulmonary effort is normal.     Breath sounds: Normal breath sounds.  Abdominal:     General: Abdomen is flat. Bowel sounds are normal.  Skin:    General: Skin is warm.  Neurological:     General: No focal deficit present.     Mental Status: Mental status is at baseline.  Psychiatric:        Mood and Affect: Mood normal.        Behavior: Behavior normal.        Thought Content: Thought content normal.        Judgment: Judgment normal.      Lab results: Results for orders placed or performed during the hospital encounter of 02/10/23 (from the past 24 hour(s))  CBC with Differential/Platelet     Status: Abnormal   Collection Time: 02/10/23  9:45 AM  Result Value Ref Range   WBC 7.1 4.0 - 10.5 K/uL   RBC 2.07 (L) 3.87 - 5.11 MIL/uL   Hemoglobin 7.5 (L) 12.0 - 15.0 g/dL   HCT 16.1 (L) 09.6 - 04.5 %   MCV 104.8 (H) 80.0 - 100.0 fL   MCH 36.2 (H) 26.0 - 34.0 pg   MCHC 34.6 30.0 - 36.0 g/dL   RDW 40.9 (H) 81.1 - 91.4 %   Platelets 415 (H) 150 - 400 K/uL   nRBC 1.7 (H) 0.0 - 0.2 %   Neutrophils Relative % 47 %   Neutro Abs 3.3 1.7 - 7.7 K/uL   Lymphocytes Relative 34 %   Lymphs Abs 2.4 0.7 - 4.0 K/uL   Monocytes Relative 17 %   Monocytes Absolute 1.2 (H) 0.1 - 1.0 K/uL   Eosinophils Relative 1 %   Eosinophils Absolute 0.1 0.0 - 0.5 K/uL   Basophils Relative 1 %   Basophils Absolute 0.1 0.0 - 0.1 K/uL   Immature Granulocytes 0 %   Abs Immature Granulocytes 0.02 0.00 - 0.07 K/uL   Pappenheimer Bodies PRESENT    Polychromasia PRESENT    Sickle Cells MARKED    Target  Cells PRESENT   Comprehensive metabolic panel     Status: Abnormal   Collection Time: 02/10/23  9:45 AM  Result Value Ref Range   Sodium 139 135 - 145 mmol/L   Potassium 3.5 3.5 - 5.1 mmol/L   Chloride 108 98 - 111 mmol/L   CO2 23 22 - 32 mmol/L   Glucose, Bld 89 70 - 99 mg/dL   BUN 7 6 - 20 mg/dL   Creatinine, Ser 7.82 (L) 0.44 - 1.00 mg/dL   Calcium 9.1 8.9 - 95.6 mg/dL   Total Protein 6.8 6.5 - 8.1 g/dL   Albumin 4.3 3.5 - 5.0 g/dL   AST 34 15 - 41 U/L   ALT 37 0 - 44 U/L   Alkaline Phosphatase 65 38 - 126 U/L   Total Bilirubin 1.8 (H) 0.3 - 1.2 mg/dL   GFR, Estimated >21 >30 mL/min   Anion gap 8 5 - 15  Reticulocytes     Status: Abnormal   Collection Time: 02/10/23  9:45 AM  Result Value Ref Range   Retic Ct Pct 23.7 (H) 0.4 - 3.1 %   RBC. 2.07 (L) 3.87 - 5.11 MIL/uL   Retic Count, Absolute 485.0 (H) 19.0 - 186.0 K/uL   Immature Retic Fract 31.3 (H) 2.3 - 15.9 %    Imaging results:  No results found.  Assessment & Plan: Patient admitted to sickle cell day infusion center for management of pain crisis.  Patient is opiate tolerant Initiate IV dilaudid PCA IV fluids, 0.45% saline at 100 ml/hr Toradol 15 mg IV times one dose Tylenol 1000 mg by mouth times one dose Review CBC with differential, complete metabolic panel, and reticulocytes as results become available. Pain intensity will be reevaluated in context of functioning and relationship to baseline as care progresses If pain intensity remains elevated and/or sudden change in hemodynamic stability transition to inpatient services for higher level of care.      [name]  APRN, MSN, FNP-C Patient Care [facility] [address] [place], [place] [address] [phone]  02/10/2023, 11:14 AM

## 2023-02-10 NOTE — Progress Notes (Signed)
Pt admitted to day hospital today for sickle cell pain treatment. On arrival, pt rates 9/10 pain to back, knees, and bilateral hips. Pt received Dilaudid PCA, IV Toradol, IV Zofran, and hydrated with IV fluids via PIV. Pt also received PO Tylenol and Benadryl. At discharge, pt rates pain 5/10. AVS offered, but pt declined. Pt is alert, oriented, and ambulatory at discharge.

## 2023-02-10 NOTE — Telephone Encounter (Signed)
Patient called in. Complains of pain in lower back and knees bilateral rates 8/10. Denied chest pain, abd pain, fever, N/V/D. Wants to come in for treatment. Last took oxycodone 15mg  at 5am.  Pt states she was in the ED 2 weeks ago. Pt states her mother is her transportation to and from center today. Pt denies covid exposure or flu like symptoms. [place] FNP notified, approved for pt to be seen in the day hospital today. Pt made aware, verbalized understanding.

## 2023-02-10 NOTE — Discharge Summary (Signed)
[facility] Discharge Summary   Patient ID: Sandra Mckay MRN: [id number] DOB/AGE: 1995/09/17 27 y.o.  Admit date: 02/10/2023 Discharge date: 02/10/2023  Primary Care Physician:  Massie Maroon, FNP  Admission Diagnoses:  Active Problems:   Sickle cell pain crisis [facility])   Discharge Medications:  Allergies as of 02/10/2023       Reactions   Latex Rash   Wound Dressing Adhesive Rash        Medication List     TAKE these medications    Benadryl Allergy 25 mg capsule Generic drug: diphenhydrAMINE Take 50 mg by mouth every 6 (six) hours as needed for itching.   ergocalciferol 1.25 MG (50000 UT) capsule Commonly known as: Drisdol Take 1 capsule (50,000 Units total) by mouth once a week.   folic acid 1 MG tablet Commonly known as: FOLVITE Take 1 tablet (1 mg total) by mouth daily.   gabapentin 300 MG capsule Commonly known as: NEURONTIN Take 1 capsule (300 mg total) by mouth 2 (two) times daily.   hydroxyurea 500 MG capsule Commonly known as: HYDREA Take 3 capsules (1,500 mg total) by mouth daily. TAKE 3 CAPSULES (1,500 MG TOTAL) BY MOUTH DAILY.   hydrOXYzine 25 MG tablet Commonly known as: ATARAX Take 12.5 mg by mouth 2 (two) times daily as needed for itching.   ondansetron 4 MG tablet Commonly known as: Zofran Take 1 tablet (4 mg total) by mouth every 8 (eight) hours as needed for nausea or vomiting.   Oxbryta 500 MG Tabs tablet Generic drug: voxelotor Take 1,000 mg by mouth daily.   oxyCODONE 15 MG immediate release tablet Commonly known as: ROXICODONE Take 1 tablet (15 mg total) by mouth every 6 (six) hours as needed for pain. Start taking on: February 15, 2023 What changed: These instructions start on February 15, 2023. If you are unsure what to do until then, ask your doctor or other care provider.   QUEtiapine 100 MG tablet Commonly known as: SEROQUEL Take 100 mg by mouth at bedtime.   QUEtiapine 25 MG tablet Commonly known as:  SEROQUEL TAKE 1 TABLET BY MOUTH EVERYDAY AT BEDTIME   rivaroxaban 20 MG Tabs tablet Commonly known as: XARELTO Take 1 tablet (20 mg total) by mouth daily with supper.   tiZANidine 4 MG tablet Commonly known as: ZANAFLEX Take 4 mg by mouth every 8 (eight) hours as needed for muscle spasms.   Xtampza ER 13.5 MG C12a Generic drug: oxyCODONE ER Take 1 capsule by mouth every 12 (twelve) hours.         Consults:  None  Significant Diagnostic Studies:  No results found.  History of Present Illness: Sandra Mckay is a 27 year old female with a medical history significant for sickle cell disease, chronic pain syndrome, opiate dependence and tolerance, and history of anemia of chronic disease that presents with complaints of generalized pain that is consistent with her typical sickle cell crisis.  Patient states that pain intensity has been elevated over the past several days and unrelieved by her home Xtampza and oxycodone, which was last taken this a.m.  Patient did not have very much relief from her home medication regimen.  She attributes pain crisis to moving into a new home in 90 degree weather.  She denies fever, chills, chest pain, or shortness of breath.  No urinary symptoms, nausea, vomiting, or diarrhea.  No sick contacts, recent travel, or known exposure to COVID-19.   [facility] Course: Patient admitted to sickle  cell day infusion clinic for management of pain crisis.  Reviewed labs, consistent with patient's baseline.  Pain managed with IV Dilaudid PCA, IV fluids, IV Toradol, and Tylenol. Pain intensity decreased to 5/10 and patient is requesting discharge home.  She is alert, oriented, and ambulating without assistance.  Patient will discharge home in a hemodynamically stable condition.  Discharge instructions: Resume all home medications.   Follow up with PCP as previously  scheduled.   Discussed the importance of drinking 64 ounces of water daily,  dehydration of red blood cells may lead further sickling.   Avoid all stressors that precipitate sickle cell pain crisis.     The patient was given clear instructions to go to ER or return to medical center if symptoms do not improve, worsen or new problems develop.      Physical Exam at Discharge:  BP (!) 105/51 (BP Location: Left Arm)   Pulse 75   Temp 98.5 F (36.9 C) (Temporal)   Resp 14   LMP 02/08/2023   SpO2 95%  Physical Exam Eyes:     Pupils: Pupils are equal, round, and reactive to light.  Cardiovascular:     Rate and Rhythm: Normal rate and regular rhythm.  Pulmonary:     Effort: Pulmonary effort is normal.  Abdominal:     General: Bowel sounds are normal.  Musculoskeletal:        General: Normal range of motion.  Skin:    General: Skin is warm.  Neurological:     General: No focal deficit present.     Mental Status: Mental status is at baseline.  Psychiatric:        Mood and Affect: Mood normal.        Behavior: Behavior normal.        Thought Content: Thought content normal.        Judgment: Judgment normal.       Disposition at Discharge: Discharge disposition: 01-Home or Self Care       Discharge Orders: Discharge Instructions     Discharge patient   Complete by: As directed    Discharge disposition: 01-Home or Self Care   Discharge patient date: 02/10/2023       Condition at Discharge:   Stable  Time spent on Discharge:  Greater than 30 minutes.  Signed: [name]  APRN, MSN, FNP-C Patient Care [facility] [address] [place], [place] [address] [phone]  02/10/2023, 2:14 PM

## 2023-02-22 ENCOUNTER — Ambulatory Visit: Payer: Medicaid Other | Admitting: Clinical

## 2023-02-22 DIAGNOSIS — F419 Anxiety disorder, unspecified: Secondary | ICD-10-CM

## 2023-02-22 DIAGNOSIS — F319 Bipolar disorder, unspecified: Secondary | ICD-10-CM | POA: Diagnosis not present

## 2023-02-22 NOTE — BH Specialist Note (Unsigned)
Mom interacts with me differently than she does others and it hurts my feelings  Go to sister's with [place] for a week  Mom is trigger and support  Tx plan signed??

## 2023-02-25 ENCOUNTER — Other Ambulatory Visit: Payer: Self-pay

## 2023-02-25 DIAGNOSIS — D571 Sickle-cell disease without crisis: Secondary | ICD-10-CM

## 2023-02-25 DIAGNOSIS — G894 Chronic pain syndrome: Secondary | ICD-10-CM

## 2023-02-25 NOTE — Telephone Encounter (Signed)
Please advise in [place] absence. Kh 

## 2023-03-01 ENCOUNTER — Telehealth: Payer: Self-pay | Admitting: Internal Medicine

## 2023-03-01 MED ORDER — OXYCODONE HCL 15 MG PO TABS
15.0000 mg | ORAL_TABLET | Freq: Four times a day (QID) | ORAL | 0 refills | Status: DC | PRN
Start: 2023-03-01 — End: 2023-03-15

## 2023-03-01 MED ORDER — XTAMPZA ER 13.5 MG PO C12A
1.0000 | EXTENDED_RELEASE_CAPSULE | Freq: Two times a day (BID) | ORAL | 0 refills | Status: DC
Start: 2023-03-01 — End: 2023-03-30

## 2023-03-01 NOTE — Telephone Encounter (Signed)
Caller & Relationship to patient:  self MRN #  [id number]   Call Back Number: [phone]  Date of Last Office Visit: 02/25/2023     Date of Next Office Visit: 03/05/2023   Medication(s) to be Refilled: Marlowe Kays and Oxycodone  Preferred Pharmacy: NEW PHARMACY: [facility] [place] [place] - [address] [place] [address] Phone:  [phone]  ** Please notify patient to allow 48-72 hours to process** **Let patient know to contact pharmacy at the end of the day to make sure medication is ready. ** **If patient has not been seen in a year or longer, book an appointment **Advise to use MyChart for refill requests OR to contact their pharmacy

## 2023-03-05 ENCOUNTER — Ambulatory Visit: Payer: Self-pay | Admitting: Nurse Practitioner

## 2023-03-05 ENCOUNTER — Other Ambulatory Visit: Payer: Self-pay | Admitting: Family Medicine

## 2023-03-05 NOTE — Progress Notes (Signed)
During previous appointment, patient's ferritin level was elevated, which is more than likely secondary to past blood transfusions.  Please schedule lab appointment within the next few weeks to repeat ferritin level.  If level remains greater than 2000, will initiate iron chelation therapy.   [name]  APRN, MSN, FNP-C Patient Care [facility] [address] [place], [place] [address] [phone]

## 2023-03-05 NOTE — Progress Notes (Signed)
Orders Placed This Encounter  Procedures   Ferritin    Standing Status:   Future    Standing Expiration Date:   03/04/2024   [name]  APRN, MSN, FNP-C Patient Care [facility] [address] [place], [place] [address] [phone]

## 2023-03-08 ENCOUNTER — Encounter: Payer: Medicaid Other | Admitting: Clinical

## 2023-03-15 ENCOUNTER — Other Ambulatory Visit: Payer: Self-pay | Admitting: Family Medicine

## 2023-03-15 ENCOUNTER — Other Ambulatory Visit: Payer: Self-pay

## 2023-03-15 ENCOUNTER — Telehealth: Payer: Self-pay | Admitting: Family Medicine

## 2023-03-15 DIAGNOSIS — D571 Sickle-cell disease without crisis: Secondary | ICD-10-CM

## 2023-03-15 DIAGNOSIS — G894 Chronic pain syndrome: Secondary | ICD-10-CM

## 2023-03-15 MED ORDER — OXYCODONE HCL 15 MG PO TABS
15.0000 mg | ORAL_TABLET | Freq: Four times a day (QID) | ORAL | 0 refills | Status: DC | PRN
Start: 2023-03-16 — End: 2023-03-30

## 2023-03-15 NOTE — Telephone Encounter (Signed)
Caller & Relationship to patient:   MRN #  [id number]   Call Back Number:   Date of Last Office Visit: 03/15/2023     Date of Next Office Visit: 03/16/2023    Medication(s) to be Refilled: Oxycodone 15mg   [facility]  ** Please notify patient to allow 48-72 hours to process** **Let patient know to contact pharmacy at the end of the day to make sure medication is ready. ** **If patient has not been seen in a year or longer, book an appointment **Advise to use MyChart for refill requests OR to contact their pharmacy

## 2023-03-15 NOTE — Telephone Encounter (Signed)
Pease advise kh

## 2023-03-15 NOTE — Progress Notes (Signed)
Reviewed PDMP substance reporting system prior to prescribing opiate medications. No inconsistencies noted.  Meds ordered this encounter  Medications   oxyCODONE (ROXICODONE) 15 MG immediate release tablet    Sig: Take 1 tablet (15 mg total) by mouth every 6 (six) hours as needed for pain.    Dispense:  60 tablet    Refill:  0    Order Specific Question:   Supervising Provider    Answer:   [name], [name]   Sandra Moore Hollis  APRN, MSN, FNP-C [facility] [address]  [place], [place] [address] [phone]  

## 2023-03-16 ENCOUNTER — Other Ambulatory Visit: Payer: Self-pay

## 2023-03-18 ENCOUNTER — Other Ambulatory Visit: Payer: Self-pay

## 2023-03-25 ENCOUNTER — Non-Acute Institutional Stay (HOSPITAL_COMMUNITY)
Admission: AD | Admit: 2023-03-25 | Discharge: 2023-03-25 | Disposition: A | Discharge status: Home / Self Care | Payer: Medicaid Other | Source: Ambulatory Visit | Attending: Internal Medicine | Admitting: Internal Medicine

## 2023-03-25 ENCOUNTER — Telehealth (HOSPITAL_COMMUNITY): Payer: Self-pay | Admitting: *Deleted

## 2023-03-25 DIAGNOSIS — D638 Anemia in other chronic diseases classified elsewhere: Secondary | ICD-10-CM | POA: Diagnosis not present

## 2023-03-25 DIAGNOSIS — D5700 Hb-SS disease with crisis, unspecified: Secondary | ICD-10-CM | POA: Insufficient documentation

## 2023-03-25 DIAGNOSIS — N12 Tubulo-interstitial nephritis, not specified as acute or chronic: Secondary | ICD-10-CM

## 2023-03-25 DIAGNOSIS — G894 Chronic pain syndrome: Secondary | ICD-10-CM | POA: Diagnosis not present

## 2023-03-25 LAB — CBC WITH DIFFERENTIAL/PLATELET
Abs Immature Granulocytes: 0.06 10*3/uL (ref 0.00–0.07)
Basophils Absolute: 0.1 10*3/uL (ref 0.0–0.1)
Basophils Relative: 1 %
Eosinophils Absolute: 0 10*3/uL (ref 0.0–0.5)
Eosinophils Relative: 0 %
HCT: 23.5 % — ABNORMAL LOW (ref 36.0–46.0)
Hemoglobin: 8 g/dL — ABNORMAL LOW (ref 12.0–15.0)
Immature Granulocytes: 1 %
Lymphocytes Relative: 22 %
Lymphs Abs: 2.6 10*3/uL (ref 0.7–4.0)
MCH: 33.9 pg (ref 26.0–34.0)
MCHC: 34 g/dL (ref 30.0–36.0)
MCV: 99.6 fL (ref 80.0–100.0)
Monocytes Absolute: 2.2 10*3/uL — ABNORMAL HIGH (ref 0.1–1.0)
Monocytes Relative: 19 %
Neutro Abs: 6.8 10*3/uL (ref 1.7–7.7)
Neutrophils Relative %: 57 %
Platelets: 522 10*3/uL — ABNORMAL HIGH (ref 150–400)
RBC: 2.36 MIL/uL — ABNORMAL LOW (ref 3.87–5.11)
RDW: 19.1 % — ABNORMAL HIGH (ref 11.5–15.5)
WBC: 11.7 10*3/uL — ABNORMAL HIGH (ref 4.0–10.5)
nRBC: 0.3 % — ABNORMAL HIGH (ref 0.0–0.2)

## 2023-03-25 LAB — URINALYSIS, ROUTINE W REFLEX MICROSCOPIC
Bilirubin Urine: NEGATIVE
Glucose, UA: NEGATIVE mg/dL
Hgb urine dipstick: NEGATIVE
Ketones, ur: NEGATIVE mg/dL
Leukocytes,Ua: NEGATIVE
Nitrite: NEGATIVE
Protein, ur: NEGATIVE mg/dL
Specific Gravity, Urine: 1.008 (ref 1.005–1.030)
pH: 8 (ref 5.0–8.0)

## 2023-03-25 LAB — COMPREHENSIVE METABOLIC PANEL
ALT: 30 U/L (ref 0–44)
AST: 40 U/L (ref 15–41)
Albumin: 4.9 g/dL (ref 3.5–5.0)
Alkaline Phosphatase: 76 U/L (ref 38–126)
Anion gap: 8 (ref 5–15)
BUN: 9 mg/dL (ref 6–20)
CO2: 23 mmol/L (ref 22–32)
Calcium: 9.7 mg/dL (ref 8.9–10.3)
Chloride: 106 mmol/L (ref 98–111)
Creatinine, Ser: <0.3 mg/dL — ABNORMAL LOW (ref 0.44–1.00)
Glucose, Bld: 96 mg/dL (ref 70–99)
Potassium: 4.2 mmol/L (ref 3.5–5.1)
Sodium: 137 mmol/L (ref 135–145)
Total Bilirubin: 2.8 mg/dL — ABNORMAL HIGH (ref 0.3–1.2)
Total Protein: 8 g/dL (ref 6.5–8.1)

## 2023-03-25 LAB — LACTATE DEHYDROGENASE: LDH: 388 U/L — ABNORMAL HIGH (ref 98–192)

## 2023-03-25 MED ORDER — ONDANSETRON HCL 4 MG/2ML IJ SOLN
4.0000 mg | Freq: Four times a day (QID) | INTRAMUSCULAR | Status: DC | PRN
  Administered 2023-03-25: 4 mg via INTRAVENOUS
  Filled 2023-03-25: qty 2

## 2023-03-25 MED ORDER — KETOROLAC TROMETHAMINE 15 MG/ML IJ SOLN
15.0000 mg | Freq: Four times a day (QID) | INTRAMUSCULAR | Status: DC
  Administered 2023-03-25: 15 mg via INTRAVENOUS
  Filled 2023-03-25: qty 1

## 2023-03-25 MED ORDER — SODIUM CHLORIDE 0.9% FLUSH: 9.0000 mL | INTRAVENOUS | Status: DC | PRN

## 2023-03-25 MED ORDER — NALOXONE HCL 0.4 MG/ML IJ SOLN: 0.4000 mg | INTRAMUSCULAR | Status: DC | PRN

## 2023-03-25 MED ORDER — HYDROMORPHONE 1 MG/ML IV SOLN
INTRAVENOUS | Status: DC
  Administered 2023-03-25: 30 mg via INTRAVENOUS
  Administered 2023-03-25: 12 mg via INTRAVENOUS
  Filled 2023-03-25: qty 30

## 2023-03-25 MED ORDER — SENNOSIDES-DOCUSATE SODIUM 8.6-50 MG PO TABS: 1.0000 | ORAL_TABLET | Freq: Two times a day (BID) | ORAL | Status: DC

## 2023-03-25 MED ORDER — DIPHENHYDRAMINE HCL 25 MG PO CAPS
25.0000 mg | ORAL_CAPSULE | ORAL | Status: DC | PRN
  Administered 2023-03-25: 25 mg via ORAL
  Filled 2023-03-25: qty 1

## 2023-03-25 MED ORDER — DEXTROSE-SODIUM CHLORIDE 5-0.45 % IV SOLN: INTRAVENOUS | Status: DC

## 2023-03-25 MED ORDER — POLYETHYLENE GLYCOL 3350 17 G PO PACK: 17.0000 g | PACK | Freq: Every day | ORAL | Status: DC | PRN

## 2023-03-25 NOTE — Progress Notes (Signed)
Patient admitted to the day infusion hospital for sickle cell pain. Initially, patient reported bilateral hip, leg and back pain rated 8/10. For pain management, patient placed on Sickle Cell Dose Dilaudid PCA, given 15 mg IV Toradol and hydrated with IV fluids. At discharge, patient rated pain at 5/10. Vital signs stable. Printed AVS offered but patient refused. Patient alert, oriented and ambulatory at discharge.

## 2023-03-25 NOTE — Discharge Summary (Signed)
Physician Discharge Summary   Patient: Sandra Mckay MRN: [id number] DOB: 30-Jun-1996  Admit date:     03/25/2023  Discharge date: 03/25/2023  Discharge Physician: Lonia Blood   PCP: Massie Maroon, FNP   Recommendations at discharge:   Resume home regimen.  Will obtain prescription refill when due  Discharge Diagnoses: Active Problems:   Sickle cell pain crisis (HCC)  Resolved Problems:   * No resolved hospital problems. [facility] Course: Patient was admitted with sickle cell pain crisis.  Pain was initially 8 out of 10 bilateral him legs and back.  She also Dilaudid PCA, Toradol 50 mg IV, IV fluids.  Patient did better throughout the course of her hospitalization.  She also complained of dysuria and urinalysis was done which was negative.  At this point patient was discharged home to resume her home regimen.  Assessment and Plan: No notes have been filed under this hospital service. Service: Hospitalist        Consultants: None Procedures performed: Urinalysis Disposition: Home Diet recommendation:  Regular diet DISCHARGE MEDICATION: Allergies as of 03/25/2023       Reactions   Latex Rash   Wound Dressing Adhesive Rash        Medication List     TAKE these medications    Benadryl Allergy 25 mg capsule Generic drug: diphenhydrAMINE Take 50 mg by mouth every 6 (six) hours as needed for itching.   ergocalciferol 1.25 MG (50000 UT) capsule Commonly known as: Drisdol Take 1 capsule (50,000 Units total) by mouth once a week.   folic acid 1 MG tablet Commonly known as: FOLVITE Take 1 tablet (1 mg total) by mouth daily.   gabapentin 300 MG capsule Commonly known as: NEURONTIN Take 1 capsule (300 mg total) by mouth 2 (two) times daily.   hydroxyurea 500 MG capsule Commonly known as: HYDREA Take 3 capsules (1,500 mg total) by mouth daily. TAKE 3 CAPSULES (1,500 MG TOTAL) BY MOUTH DAILY.   hydrOXYzine 25 MG tablet Commonly known as: ATARAX Take  12.5 mg by mouth 2 (two) times daily as needed for itching.   ondansetron 4 MG tablet Commonly known as: Zofran Take 1 tablet (4 mg total) by mouth every 8 (eight) hours as needed for nausea or vomiting.   Oxbryta 500 MG Tabs tablet Generic drug: voxelotor Take 1,000 mg by mouth daily.   oxyCODONE 15 MG immediate release tablet Commonly known as: ROXICODONE Take 1 tablet (15 mg total) by mouth every 6 (six) hours as needed for pain.   QUEtiapine 100 MG tablet Commonly known as: SEROQUEL Take 100 mg by mouth at bedtime.   QUEtiapine 25 MG tablet Commonly known as: SEROQUEL TAKE 1 TABLET BY MOUTH EVERYDAY AT BEDTIME   rivaroxaban 20 MG Tabs tablet Commonly known as: XARELTO Take 1 tablet (20 mg total) by mouth daily with supper.   tiZANidine 4 MG tablet Commonly known as: ZANAFLEX Take 4 mg by mouth every 8 (eight) hours as needed for muscle spasms.   Xtampza ER 13.5 MG C12a Generic drug: oxyCODONE ER Take 1 capsule by mouth every 12 (twelve) hours.        Discharge Exam: There were no vitals filed for this visit. Constitutional: NAD, calm, comfortable Eyes: PERRL, lids and conjunctivae normal ENMT: Mucous membranes are moist. Posterior pharynx clear of any exudate or lesions.Normal dentition.  Neck: normal, supple, no masses, no thyromegaly Respiratory: clear to auscultation bilaterally, no wheezing, no crackles. Normal respiratory effort. No accessory muscle use.  Cardiovascular:  Regular rate and rhythm, no murmurs / rubs / gallops. No extremity edema. 2+ pedal pulses. No carotid bruits.  Abdomen: no tenderness, no masses palpated. No hepatosplenomegaly. Bowel sounds positive.  Musculoskeletal: Good range of motion, no joint swelling or tenderness, Skin: no rashes, lesions, ulcers. No induration Neurologic: CN 2-12 grossly intact. Sensation intact, DTR normal. Strength 5/5 in all 4.  Psychiatric: Normal judgment and insight. Alert and oriented x 3. Normal  mood   Condition at discharge: good  The results of significant diagnostics from this hospitalization (including imaging, microbiology, ancillary and laboratory) are listed below for reference.   Imaging Studies: No results found.  Microbiology: Results for orders placed or performed during the hospital encounter of 10/22/22  Urine Culture (for pregnant, neutropenic or urologic patients or patients with an indwelling urinary catheter)     Status: Abnormal   Collection Time: 10/22/22 12:35 PM   Specimen: Urine, Clean Catch  Result Value Ref Range Status   Specimen Description   Final    URINE, CLEAN CATCH Performed at [facility], [address] [place], [place] [address]    Special Requests   Final    NONE Performed at [facility], [address], [place], [place] [address]    Culture >=100,000 COLONIES/mL KLEBSIELLA PNEUMONIAE (A)  Final   Report Status 10/24/2022 FINAL  Final   Organism ID, Bacteria KLEBSIELLA PNEUMONIAE (A)  Final      Susceptibility   Klebsiella pneumoniae - MIC*    AMPICILLIN RESISTANT Resistant     CEFAZOLIN <=4 SENSITIVE Sensitive     CEFEPIME <=0.12 SENSITIVE Sensitive     CEFTRIAXONE <=0.25 SENSITIVE Sensitive     CIPROFLOXACIN <=0.25 SENSITIVE Sensitive     GENTAMICIN <=1 SENSITIVE Sensitive     IMIPENEM <=0.25 SENSITIVE Sensitive     NITROFURANTOIN <=16 SENSITIVE Sensitive     TRIMETH/SULFA <=20 SENSITIVE Sensitive     AMPICILLIN/SULBACTAM 4 SENSITIVE Sensitive     PIP/TAZO <=4 SENSITIVE Sensitive     * >=100,000 COLONIES/mL KLEBSIELLA PNEUMONIAE    Labs: CBC: Recent Labs  Lab 03/25/23 1015  WBC 11.7*  NEUTROABS 6.8  HGB 8.0*  HCT 23.5*  MCV 99.6  PLT 522*   Basic Metabolic Panel: Recent Labs  Lab 03/25/23 1015  NA 137  K 4.2  CL 106  CO2 23  GLUCOSE 96  BUN 9  CREATININE <0.30*  CALCIUM 9.7   Liver Function Tests: Recent Labs  Lab 03/25/23 1015  AST 40  ALT 30  ALKPHOS 76   BILITOT 2.8*  PROT 8.0  ALBUMIN 4.9   CBG: No results for input(s): "GLUCAP" in the last 168 hours.  Discharge time spent: less than 30 minutes.  SignedLonia Blood, MD [facility] 03/25/2023

## 2023-03-25 NOTE — Hospital Course (Signed)
Patient was admitted with sickle cell pain crisis.  Pain was initially 8 out of 10 bilateral him legs and back.  She also Dilaudid PCA, Toradol 50 mg IV, IV fluids.  Patient did better throughout the course of her hospitalization.  She also complained of dysuria and urinalysis was done which was negative.  At this point patient was discharged home to resume her home regimen.

## 2023-03-25 NOTE — Telephone Encounter (Signed)
Patient called requesting to come to the day hospital for sickle cell pain. Patient reports bilateral leg and hip pain rated 8/10. Reports taking Oxycodone and Xtampza at 6:00 am. COVID-19 screening done and patient denies all symptoms and exposures. Denies fever, chest pain, nausea, vomiting, diarrhea and abdominal pain. Admits to having transportation without driving self. Patient will use a Taxi for transportation. Dr. Mikeal Hawthorne notified. Patient can come to the day hospital for pain management. Patient advised and expresses an understanding.

## 2023-03-25 NOTE — H&P (Signed)
History and Physical    Patient: Sandra Mckay QIH:[id number] DOB: 09-Feb-1996 DOA: 03/25/2023 DOS: the patient was seen and examined on 03/25/2023 PCP: Massie Maroon, FNP  Patient coming from: Home  Chief Complaint: No chief complaint on file.  HPI: Sandra Mckay is a 27 y.o. female with medical history significant of patient with sickle cell disease presents with sickle cell day hospital with pain in her legs and back consistent with typical sickle cell crisis.  Patient's prescription expired today.  She has as needed oxycodone that she has been stretching out.  She has taken for as long as typical.  She does not have a refill until next week.  Denied any fever or chills no nausea vomiting or diarrhea.  Review of Systems: As mentioned in the history of present illness. All other systems reviewed and are negative. Past Medical History:  Diagnosis Date   Acute cystitis without hematuria 10/21/2019   Blood transfusion without reported diagnosis    Bronchitis    Chickenpox    Depression    Elevated ferritin level 05/2019   Heart murmur    Nausea without vomiting 09/09/2015   Pulmonary hypertension (HCC)    Sickle cell anemia (HCC)    Sickle cell disease, type SS (HCC)    Sickle cell pain crisis (HCC) 12/05/2016   Thrombocytosis 05/2019   Urinary tract infection    Vitamin D deficiency    Past Surgical History:  Procedure Laterality Date   CHOLECYSTECTOMY  2011   EYE SURGERY     Sty removal   IR IMAGING GUIDED PORT INSERTION  03/06/2022   IR REMOVAL TUN ACCESS W/ PORT W/O FL MOD SED  05/29/2022   LABIAL ADHESION LYSIS  1999   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 She has never used smokeless tobacco. She reports current alcohol use. She reports current drug use. Drug: Marijuana.  Allergies  Allergen Reactions   Latex Rash   Wound Dressing Adhesive Rash     Family History  Problem Relation Age of Onset   Arthritis Other        grandparent   Stroke Other    Hypertension Other    Diabetes Other        grandparent   Cancer - Other Other        Glioblastoma    Prior to Admission medications   Medication Sig Start Date End Date Taking? Authorizing Provider  gabapentin (NEURONTIN) 300 MG capsule Take 1 capsule (300 mg total) by mouth 2 (two) times daily. 01/05/23  Yes Massie Maroon, FNP  hydrOXYzine (ATARAX) 25 MG tablet Take 12.5 mg by mouth 2 (two) times daily as needed for itching.   Yes [provider]  oxyCODONE (ROXICODONE) 15 MG immediate release tablet Take 1 tablet (15 mg total) by mouth every 6 (six) hours as needed for pain. 03/16/23  Yes Massie Maroon, FNP  oxyCODONE ER [facility] ER) 13.5 MG C12A Take 1 capsule by mouth every 12 (twelve) hours. 03/01/23  Yes Ivonne Andrew, NP  QUEtiapine (SEROQUEL) 100 MG tablet Take 100 mg by mouth at bedtime.   Yes [provider]  rivaroxaban (XARELTO) 20 MG TABS tablet Take 1 tablet (20 mg total) by mouth daily with supper. 05/31/22  Yes [name], Phylliss Blakes, MD  voxelotor (OXBRYTA) 500 MG TABS tablet Take 1,000 mg by mouth daily.  09/14/22  Yes Massie Maroon, FNP  diphenhydrAMINE (BENADRYL ALLERGY) 25 mg capsule Take 50 mg by mouth every 6 (six) hours as needed for itching. Patient not taking: Reported on 02/02/2023    [provider]  ergocalciferol (DRISDOL) 1.25 MG (50000 UT) capsule Take 1 capsule (50,000 Units total) by mouth once a week. 11/11/22   Massie Maroon, FNP  folic acid (FOLVITE) 1 MG tablet Take 1 tablet (1 mg total) by mouth daily. 04/20/22   [name], MD  hydroxyurea (HYDREA) 500 MG capsule Take 3 capsules (1,500 mg total) by mouth daily. TAKE 3 CAPSULES (1,500 MG TOTAL) BY MOUTH DAILY. Patient not taking: Reported on 02/02/2023 04/20/22   [name], MD  ondansetron (ZOFRAN) 4 MG tablet Take 1 tablet (4 mg total) by  mouth every 8 (eight) hours as needed for nausea or vomiting. 11/03/22   Massie Maroon, FNP  QUEtiapine (SEROQUEL) 25 MG tablet TAKE 1 TABLET BY MOUTH EVERYDAY AT BEDTIME Patient not taking: Reported on 01/05/2023 06/03/22   Massie Maroon, FNP  tiZANidine (ZANAFLEX) 4 MG tablet Take 4 mg by mouth every 8 (eight) hours as needed for muscle spasms.    [provider]    Physical Exam: Vitals:   03/25/23 1016  BP: 112/62  Pulse: 77  Resp: 16  Temp: 99.3 F (37.4 C)  TempSrc: Temporal  SpO2: 100%   Constitutional: NAD, calm, comfortable Eyes: PERRL, lids and conjunctivae normal ENMT: Mucous membranes are moist. Posterior pharynx clear of any exudate or lesions.Normal dentition.  Neck: normal, supple, no masses, no thyromegaly Respiratory: clear to auscultation bilaterally, no wheezing, no crackles. Normal respiratory effort. No accessory muscle use.  Cardiovascular: Regular rate and rhythm, no murmurs / rubs / gallops. No extremity edema. 2+ pedal pulses. No carotid bruits.  Abdomen: no tenderness, no masses palpated. No hepatosplenomegaly. Bowel sounds positive.  Musculoskeletal: Good range of motion, no joint swelling or tenderness, Skin: no rashes, lesions, ulcers. No induration Neurologic: CN 2-12 grossly intact. Sensation intact, DTR normal. Strength 5/5 in all 4.  Psychiatric: Normal judgment and insight. Alert and oriented x 3. Normal mood  Data Reviewed:  There are no new results to review at this time.  Assessment and Plan:  #1 sickle cell pain crisis: Patient will be admitted to the hospital.  Initiate Dilaudid PCA, Toradol, IV fluids, supportive care.  Assess pain and order labs.  #2 chronic pain syndrome: Patient to resume home regimen at discharge  #3 anemia of chronic disease: Will check hemoglobin.  If transfusion is needed we will do it.    Advance Care Planning:   Code Status: Prior full code  Consults: None  Family Communication: No family at  bedside  Severity of Illness: The appropriate patient status for this patient is OBSERVATION. Observation status is judged to be reasonable and necessary in order to provide the required intensity of service to ensure the patient's safety. The patient's presenting symptoms, physical exam findings, and initial radiographic and laboratory data in the context of their medical condition is felt to place them at decreased risk for further clinical deterioration. Furthermore, it is anticipated that the patient will be medically stable for discharge from the hospital within 2 midnights of admission.   AuthorLonia Blood, MD 03/25/2023 10:25 AM  For on call review [email or web address].

## 2023-03-30 ENCOUNTER — Other Ambulatory Visit: Payer: Self-pay | Admitting: Family Medicine

## 2023-03-30 DIAGNOSIS — D571 Sickle-cell disease without crisis: Secondary | ICD-10-CM

## 2023-03-30 DIAGNOSIS — G894 Chronic pain syndrome: Secondary | ICD-10-CM

## 2023-03-30 MED ORDER — XTAMPZA ER 13.5 MG PO C12A
1.0000 | EXTENDED_RELEASE_CAPSULE | Freq: Two times a day (BID) | ORAL | 0 refills | Status: DC
Start: 2023-03-30 — End: 2023-04-14

## 2023-03-30 MED ORDER — OXYCODONE HCL 15 MG PO TABS
15.0000 mg | ORAL_TABLET | Freq: Four times a day (QID) | ORAL | 0 refills | Status: DC | PRN
Start: 2023-03-31 — End: 2023-04-14

## 2023-03-30 NOTE — Telephone Encounter (Signed)
Reviewed PDMP substance reporting system prior to prescribing opiate medications. No inconsistencies noted.  Meds ordered this encounter  Medications   oxyCODONE ER (XTAMPZA ER) 13.5 MG C12A    Sig: Take 1 capsule by mouth every 12 (twelve) hours.    Dispense:  14 capsule    Refill:  0    Order Specific Question:   Supervising Provider    Answer:   [name] [[id number]]   oxyCODONE (ROXICODONE) 15 MG immediate release tablet    Sig: Take 1 tablet (15 mg total) by mouth every 6 (six) hours as needed for pain.    Dispense:  60 tablet    Refill:  0    Order Specific Question:   Supervising Provider    Answer:   [name] [[id number]]   [name]  APRN, MSN, FNP-C Patient Care [facility] [address] [place], [place] [address] [phone]

## 2023-04-13 ENCOUNTER — Telehealth: Payer: Self-pay | Admitting: Family Medicine

## 2023-04-13 NOTE — Telephone Encounter (Signed)
Caller & Relationship to patient:   MRN #  [id number]   Call Back Number:   Date of Last Office Visit: 03/30/2023     Date of Next Office Visit: Visit date not found    Medication(s) to be Refilled: Xtampza and Oxycodone  Preferred [facility]acy: [facility] in [place], [place]  ** Please notify patient to allow 48-72 hours to process** **Let patient know to contact pharmacy at the end of the day to make sure medication is ready. ** **If patient has not been seen in a year or longer, book an appointment **Advise to use MyChart for refill requests OR to contact their pharmacy

## 2023-04-14 ENCOUNTER — Other Ambulatory Visit: Payer: Self-pay | Admitting: Family Medicine

## 2023-04-14 DIAGNOSIS — D571 Sickle-cell disease without crisis: Secondary | ICD-10-CM

## 2023-04-14 DIAGNOSIS — G894 Chronic pain syndrome: Secondary | ICD-10-CM

## 2023-04-14 MED ORDER — XTAMPZA ER 13.5 MG PO C12A
1.0000 | EXTENDED_RELEASE_CAPSULE | Freq: Two times a day (BID) | ORAL | 0 refills | Status: DC
Start: 2023-04-14 — End: 2023-06-09

## 2023-04-14 MED ORDER — OXYCODONE HCL 15 MG PO TABS: 15.0000 mg | ORAL_TABLET | Freq: Four times a day (QID) | ORAL | 0 refills | Status: DC | PRN

## 2023-04-14 NOTE — Progress Notes (Signed)
Reviewed PDMP substance reporting system prior to prescribing opiate medications. No inconsistencies noted.  Meds ordered this encounter  Medications   oxyCODONE (ROXICODONE) 15 MG immediate release tablet    Sig: Take 1 tablet (15 mg total) by mouth every 6 (six) hours as needed for pain.    Dispense:  60 tablet    Refill:  0    Order Specific Question:   Supervising Provider    Answer:   [name] [id number]   oxyCODONE ER (XTAMPZA ER) 13.5 MG C12A    Sig: Take 1 capsule by mouth every 12 (twelve) hours.    Dispense:  14 capsule    Refill:  0    Order Specific Question:   Supervising Provider    Answer:   [name] [[id number]]     [name]  APRN, MSN, FNP-C Patient Care [facility] [address] [place], [place] [address] [phone]

## 2023-04-23 ENCOUNTER — Telehealth (HOSPITAL_COMMUNITY): Payer: Self-pay | Admitting: *Deleted

## 2023-04-23 ENCOUNTER — Encounter (HOSPITAL_COMMUNITY): Payer: Self-pay

## 2023-04-23 ENCOUNTER — Telehealth (HOSPITAL_COMMUNITY): Payer: Self-pay | Admitting: General Practice

## 2023-04-23 ENCOUNTER — Non-Acute Institutional Stay (HOSPITAL_COMMUNITY)
Admission: RE | Admit: 2023-04-23 | Discharge: 2023-04-23 | Disposition: A | Discharge status: Home / Self Care | Payer: Medicare Other | Source: Ambulatory Visit | Attending: Internal Medicine | Admitting: Internal Medicine

## 2023-04-23 DIAGNOSIS — D5700 Hb-SS disease with crisis, unspecified: Secondary | ICD-10-CM

## 2023-04-23 NOTE — Telephone Encounter (Signed)
Patient called, requesting to come to the day hospital due to pain in the hips and legs rated at 8/10. Denied chest pain, fever, diarrhea, abdominal pain, nausea/vomitting. Screened negative for Covid-19 symptoms. Admitted to having means of transportation without driving self. Last took 15 mg of oxycodone and Xtampza 13.5 mg at 23:00 on 04/22/23. Per provider, patient can come to the day hospital for treatment. Patient notified. However patient stated since she has no [employer] administrator, she will not be able to come to the [facility]. Furthermore, she told writer that in her last hospital visit she was told that her hemoglobin was 6.9. Per provider, patient should at least come today for lab draw. Patient was agreeable to the plan to come. Awaiting the patient arrival for lab draw.

## 2023-04-23 NOTE — Telephone Encounter (Signed)
Staff spoke with patient this morning and patient reported that her hemoglobin had been low at recent ED visit. Provider notified and advised patient to come in for lab work. Patient arrived to the sickle cell day hospital lobby for lab draw. Patient had to wait in the lobby for extended period of time due to clinic volume. Patient unable to continue to wait and left without getting labs drawn. Sandra Hollis, FNP notified.

## 2023-04-26 ENCOUNTER — Telehealth (HOSPITAL_COMMUNITY): Payer: Self-pay

## 2023-04-26 ENCOUNTER — Non-Acute Institutional Stay (HOSPITAL_COMMUNITY)
Admission: AD | Admit: 2023-04-26 | Discharge: 2023-04-26 | Disposition: A | Discharge status: Home / Self Care | Payer: Medicare Other | Source: Ambulatory Visit | Attending: Internal Medicine | Admitting: Internal Medicine

## 2023-04-26 DIAGNOSIS — D5700 Hb-SS disease with crisis, unspecified: Secondary | ICD-10-CM | POA: Diagnosis not present

## 2023-04-26 DIAGNOSIS — D638 Anemia in other chronic diseases classified elsewhere: Secondary | ICD-10-CM | POA: Diagnosis not present

## 2023-04-26 DIAGNOSIS — G894 Chronic pain syndrome: Secondary | ICD-10-CM | POA: Insufficient documentation

## 2023-04-26 DIAGNOSIS — F1120 Opioid dependence, uncomplicated: Secondary | ICD-10-CM | POA: Insufficient documentation

## 2023-04-26 DIAGNOSIS — F319 Bipolar disorder, unspecified: Secondary | ICD-10-CM | POA: Insufficient documentation

## 2023-04-26 LAB — CBC WITH DIFFERENTIAL/PLATELET
Abs Immature Granulocytes: 0.02 10*3/uL (ref 0.00–0.07)
Basophils Absolute: 0.1 10*3/uL (ref 0.0–0.1)
Basophils Relative: 1 %
Eosinophils Absolute: 0.1 10*3/uL (ref 0.0–0.5)
Eosinophils Relative: 1 %
HCT: 23 % — ABNORMAL LOW (ref 36.0–46.0)
Hemoglobin: 7.8 g/dL — ABNORMAL LOW (ref 12.0–15.0)
Immature Granulocytes: 0 %
Lymphocytes Relative: 31 %
Lymphs Abs: 2.5 10*3/uL (ref 0.7–4.0)
MCH: 35.6 pg — ABNORMAL HIGH (ref 26.0–34.0)
MCHC: 33.9 g/dL (ref 30.0–36.0)
MCV: 105 fL — ABNORMAL HIGH (ref 80.0–100.0)
Monocytes Absolute: 1.4 10*3/uL — ABNORMAL HIGH (ref 0.1–1.0)
Monocytes Relative: 18 %
Neutro Abs: 3.9 10*3/uL (ref 1.7–7.7)
Neutrophils Relative %: 49 %
Platelets: 504 10*3/uL — ABNORMAL HIGH (ref 150–400)
RBC: 2.19 MIL/uL — ABNORMAL LOW (ref 3.87–5.11)
RDW: 23.1 % — ABNORMAL HIGH (ref 11.5–15.5)
WBC: 7.9 10*3/uL (ref 4.0–10.5)
nRBC: 2.3 % — ABNORMAL HIGH (ref 0.0–0.2)

## 2023-04-26 LAB — COMPREHENSIVE METABOLIC PANEL
ALT: 27 U/L (ref 0–44)
AST: 39 U/L (ref 15–41)
Albumin: 4.8 g/dL (ref 3.5–5.0)
Alkaline Phosphatase: 78 U/L (ref 38–126)
Anion gap: 9 (ref 5–15)
BUN: 5 mg/dL — ABNORMAL LOW (ref 6–20)
CO2: 22 mmol/L (ref 22–32)
Calcium: 9.4 mg/dL (ref 8.9–10.3)
Chloride: 106 mmol/L (ref 98–111)
Creatinine, Ser: 0.36 mg/dL — ABNORMAL LOW (ref 0.44–1.00)
GFR, Estimated: >60 mL/min (ref >60)
Glucose, Bld: 112 mg/dL — ABNORMAL HIGH (ref 70–99)
Potassium: 4 mmol/L (ref 3.5–5.1)
Sodium: 137 mmol/L (ref 135–145)
Total Bilirubin: 2.7 mg/dL — ABNORMAL HIGH (ref 0.3–1.2)
Total Protein: 8 g/dL (ref 6.5–8.1)

## 2023-04-26 LAB — RETICULOCYTES
Immature Retic Fract: 34.1 % — ABNORMAL HIGH (ref 2.3–15.9)
RBC.: 2.19 MIL/uL — ABNORMAL LOW (ref 3.87–5.11)
Retic Count, Absolute: 637.5 10*3/uL — ABNORMAL HIGH (ref 19.0–186.0)
Retic Ct Pct: 29.6 % — ABNORMAL HIGH (ref 0.4–3.1)

## 2023-04-26 LAB — TYPE AND SCREEN
ABO/RH(D): A POS
Antibody Screen: NEGATIVE

## 2023-04-26 LAB — PREGNANCY, URINE: Preg Test, Ur: NEGATIVE

## 2023-04-26 MED ORDER — KETOROLAC TROMETHAMINE 30 MG/ML IJ SOLN
15.0000 mg | Freq: Once | INTRAMUSCULAR | Status: AC
  Administered 2023-04-26: 15 mg via INTRAVENOUS
  Filled 2023-04-26: qty 1

## 2023-04-26 MED ORDER — SODIUM CHLORIDE 0.45 % IV SOLN: INTRAVENOUS | Status: DC

## 2023-04-26 MED ORDER — HYDROMORPHONE 1 MG/ML IV SOLN
INTRAVENOUS | Status: DC
  Administered 2023-04-26: 30 mg via INTRAVENOUS
  Administered 2023-04-26: 15.5 mg via INTRAVENOUS
  Filled 2023-04-26: qty 30

## 2023-04-26 MED ORDER — ONDANSETRON HCL 4 MG/2ML IJ SOLN
4.0000 mg | Freq: Four times a day (QID) | INTRAMUSCULAR | Status: DC | PRN
  Administered 2023-04-26: 4 mg via INTRAVENOUS
  Filled 2023-04-26: qty 2

## 2023-04-26 MED ORDER — NALOXONE HCL 0.4 MG/ML IJ SOLN: 0.4000 mg | INTRAMUSCULAR | Status: DC | PRN

## 2023-04-26 MED ORDER — ACETAMINOPHEN 500 MG PO TABS
1000.0000 mg | ORAL_TABLET | Freq: Once | ORAL | Status: AC
  Administered 2023-04-26: 1000 mg via ORAL
  Filled 2023-04-26: qty 2

## 2023-04-26 MED ORDER — DIPHENHYDRAMINE HCL 25 MG PO CAPS
25.0000 mg | ORAL_CAPSULE | ORAL | Status: DC | PRN
  Administered 2023-04-26: 25 mg via ORAL
  Filled 2023-04-26: qty 1

## 2023-04-26 MED ORDER — SODIUM CHLORIDE 0.9% FLUSH: 9.0000 mL | INTRAVENOUS | Status: DC | PRN

## 2023-04-26 NOTE — Telephone Encounter (Signed)
Patient called day hospital wanting to come in today for sickle cell pain treatment. Pt reports 8/10 pain to bilateral hips, legs, and back. Pt reports taking Xtampza at 2 AM. Pt reports being out of her Oxycodone and reports that it is due for refill on Thursday. Pt reports being in the ER last Tuesday. Pt screened for COVID and denies symptoms and exposures. Pt denies fever, chest pain, N/V/D, and abdominal pain.  [place] Hollis, FNP notified and per provider pt can come to day hospital today. Pt notified and verbalized understanding. Pt states that she will need to use taxi transportation to the clinic, and that she has a ride home at discharge.

## 2023-04-26 NOTE — Discharge Summary (Signed)
[facility] Discharge Summary   Patient ID: Sandra Mckay MRN: [id number] DOB/AGE: 12-23-1995 27 y.o.  Admit date: 04/26/2023 Discharge date: 04/26/2023  Primary Care Physician:  Massie Maroon, FNP  Admission Diagnoses:  Active Problems:   Sickle cell pain crisis [facility])   Discharge Medications:  Allergies as of 04/26/2023       Reactions   Latex Rash   Wound Dressing Adhesive Rash        Medication List     TAKE these medications    Benadryl Allergy 25 mg capsule Generic drug: diphenhydrAMINE Take 50 mg by mouth every 6 (six) hours as needed for itching.   ergocalciferol 1.25 MG (50000 UT) capsule Commonly known as: Drisdol Take 1 capsule (50,000 Units total) by mouth once a week.   folic acid 1 MG tablet Commonly known as: FOLVITE Take 1 tablet (1 mg total) by mouth daily.   gabapentin 300 MG capsule Commonly known as: NEURONTIN Take 1 capsule (300 mg total) by mouth 2 (two) times daily.   hydroxyurea 500 MG capsule Commonly known as: HYDREA Take 3 capsules (1,500 mg total) by mouth daily. TAKE 3 CAPSULES (1,500 MG TOTAL) BY MOUTH DAILY.   hydrOXYzine 25 MG tablet Commonly known as: ATARAX Take 12.5 mg by mouth 2 (two) times daily as needed for itching.   ondansetron 4 MG tablet Commonly known as: Zofran Take 1 tablet (4 mg total) by mouth every 8 (eight) hours as needed for nausea or vomiting.   Oxbryta 500 MG Tabs tablet Generic drug: voxelotor Take 1,000 mg by mouth daily.   oxyCODONE 15 MG immediate release tablet Commonly known as: ROXICODONE Take 1 tablet (15 mg total) by mouth every 6 (six) hours as needed for pain.   QUEtiapine 100 MG tablet Commonly known as: SEROQUEL Take 100 mg by mouth at bedtime.   QUEtiapine 25 MG tablet Commonly known as: SEROQUEL TAKE 1 TABLET BY MOUTH EVERYDAY AT BEDTIME   rivaroxaban 20 MG Tabs tablet Commonly known as: XARELTO Take 1 tablet (20 mg total) by mouth daily with supper.    tiZANidine 4 MG tablet Commonly known as: ZANAFLEX Take 4 mg by mouth every 8 (eight) hours as needed for muscle spasms.   Xtampza ER 13.5 MG C12a Generic drug: oxyCODONE ER Take 1 capsule by mouth every 12 (twelve) hours.         Consults:  None  Significant Diagnostic Studies:  No results found.  History of Present Illness: Sandra Mckay is a 27 year old female with a medical history significant for sickle cell disease, chronic pain syndrome, opiate dependence and tolerance, history of anemia of chronic disease, and bipolar depression that presents with complaints of generalized with her typical pain crisis patient states that pain intensity has been elevated over the past 4 days and unrelieved home medications.  Patient was evaluated in the emergency department on yesterday at [facility], she states that her hemoglobin was 6.9 g/dL at that time.  Patient was discharged home and was advised to follow-up with clinic today.  Patient characterizes her pain as constant and sore.  She has not identified any inciting factors for crisis.  She rates pain as 9/10.  She last had Xtampza this a.m. without sustained relief.  Patient denies fever, chills, chest pain, or shortness of breath.  No urinary symptoms, nausea, vomiting, or diarrhea.  No sick contacts, recent travel, or known exposure to COVID-19. [facility] Course: Patient admitted to sickle cell day  infusion clinic for management of pain crisis.  Reviewed all laboratory values, improved from her baseline.  Patient's hemoglobin is 7.8 g/dL today.  No blood transfusion warranted at this time. Pain managed with IV Dilaudid PCA, IV fluids, IV Toradol, and Tylenol. Patient's pain intensity decreased throughout admission and she is requesting discharge home.  Patient alert, oriented, and ambulating without assistance.  She will discharge home in a hemodynamically stable condition.  Discharge instructions: Resume all home  medications.   Follow up with PCP as previously  scheduled.   Discussed the importance of drinking 64 ounces of water daily, dehydration of red blood cells may lead further sickling.   Avoid all stressors that precipitate sickle cell pain crisis.     The patient was given clear instructions to go to ER or return to medical center if symptoms do not improve, worsen or new problems develop.    Physical Exam at Discharge:  BP 108/70 (BP Location: Left Arm)   Pulse 62   Temp 98.5 F (36.9 C) (Temporal)   Resp 16   LMP 04/05/2023   SpO2 97%  Physical Exam Constitutional:      Appearance: Normal appearance.  Eyes:     Pupils: Pupils are equal, round, and reactive to light.  Cardiovascular:     Rate and Rhythm: Normal rate and regular rhythm.  Pulmonary:     Effort: Pulmonary effort is normal.  Abdominal:     General: Bowel sounds are normal.  Skin:    General: Skin is warm.  Neurological:     General: No focal deficit present.     Mental Status: She is alert. Mental status is at baseline.  Psychiatric:        Mood and Affect: Mood normal.        Behavior: Behavior normal.        Thought Content: Thought content normal.        Judgment: Judgment normal.    Disposition at Discharge: Discharge disposition: 01-Home or Self Care       Discharge Orders: Discharge Instructions     Discharge patient   Complete by: As directed    Discharge disposition: 01-Home or Self Care   Discharge patient date: 04/26/2023       Condition at Discharge:   Stable  Time spent on Discharge:  Greater than 30 minutes.  Signed: Julianne Handler 04/26/2023, 4:38 PM

## 2023-04-26 NOTE — Progress Notes (Signed)
Patient admitted to the day infusion hospital for sickle cell pain. Initially, patient reported bilateral legs and back pain rated 8/10. For pain management, patient placed on Sickle Cell Dose Dilaudid PCA, given IV Toradol, Tylenol and hydrated with IV fluids. At discharge, patient rated pain at 5/10. Vital signs stable. AVS offered but patient refused. Patient alert, oriented and ambulatory at discharge.

## 2023-04-26 NOTE — H&P (Signed)
[facility] History and Physical   Date: 04/26/2023  Patient name: Sandra Mckay Medical record number: [id number] Date of birth: May 01, 1996 Age: 27 y.o. Gender: female PCP: Massie Maroon, FNP  Attending physician: [name], MD  Chief Complaint: Sickle cell pain  History of Present Illness: Sandra Mckay is a 27 year old female with a medical history significant for sickle cell disease, chronic pain syndrome, opiate dependence and tolerance, history of anemia of chronic disease, and bipolar depression that presents with complaints of generalized with her typical pain crisis patient states that pain intensity has been elevated over the past 4 days and unrelieved home medications.  Patient was evaluated in the emergency department on yesterday at [facility], she states that her hemoglobin was 6.9 g/dL at that time.  Patient was discharged home and was advised to follow-up with clinic today.  Patient characterizes her pain as constant and sore.  She has not identified any inciting factors for crisis.  She rates pain as 9/10.  She last had Xtampza this a.m. without sustained relief.  Patient denies fever, chills, chest pain, or shortness of breath.  No urinary symptoms, nausea, vomiting, or diarrhea.  No sick contacts, recent travel, or known exposure to COVID-19.  Meds: Medications Prior to Admission  Medication Sig Dispense Refill Last Dose   diphenhydrAMINE (BENADRYL ALLERGY) 25 mg capsule Take 50 mg by mouth every 6 (six) hours as needed for itching. (Patient not taking: Reported on 02/02/2023)      ergocalciferol (DRISDOL) 1.25 MG (50000 UT) capsule Take 1 capsule (50,000 Units total) by mouth once a week. 12 capsule 2    folic acid (FOLVITE) 1 MG tablet Take 1 tablet (1 mg total) by mouth daily. 90 tablet 3    gabapentin (NEURONTIN) 300 MG capsule Take 1 capsule (300 mg total) by mouth 2 (two) times daily. 60 capsule 5    hydroxyurea (HYDREA) 500 MG capsule Take  3 capsules (1,500 mg total) by mouth daily. TAKE 3 CAPSULES (1,500 MG TOTAL) BY MOUTH DAILY. (Patient not taking: Reported on 02/02/2023) 270 capsule 1    hydrOXYzine (ATARAX) 25 MG tablet Take 12.5 mg by mouth 2 (two) times daily as needed for itching.      ondansetron (ZOFRAN) 4 MG tablet Take 1 tablet (4 mg total) by mouth every 8 (eight) hours as needed for nausea or vomiting. 30 tablet 3    oxyCODONE (ROXICODONE) 15 MG immediate release tablet Take 1 tablet (15 mg total) by mouth every 6 (six) hours as needed for pain. 60 tablet 0    oxyCODONE ER (XTAMPZA ER) 13.5 MG C12A Take 1 capsule by mouth every 12 (twelve) hours. 14 capsule 0    QUEtiapine (SEROQUEL) 100 MG tablet Take 100 mg by mouth at bedtime.      QUEtiapine (SEROQUEL) 25 MG tablet TAKE 1 TABLET BY MOUTH EVERYDAY AT BEDTIME (Patient not taking: Reported on 01/05/2023) 90 tablet 3    rivaroxaban (XARELTO) 20 MG TABS tablet Take 1 tablet (20 mg total) by mouth daily with supper. 30 tablet 3    tiZANidine (ZANAFLEX) 4 MG tablet Take 4 mg by mouth every 8 (eight) hours as needed for muscle spasms.      voxelotor (OXBRYTA) 500 MG TABS tablet Take 1,000 mg by mouth daily. 90 tablet 2     Allergies: Latex and Wound dressing adhesive Past Medical History:  Diagnosis Date   Acute cystitis without hematuria 10/21/2019   Blood transfusion without reported diagnosis  Bronchitis    Chickenpox    Depression    Elevated ferritin level 05/2019   Heart murmur    Nausea without vomiting 09/09/2015   Pulmonary hypertension (HCC)    Sickle cell anemia (HCC)    Sickle cell disease, type SS (HCC)    Sickle cell pain crisis (HCC) 12/05/2016   Thrombocytosis 05/2019   Urinary tract infection    Vitamin D deficiency    Past Surgical History:  Procedure Laterality Date   CHOLECYSTECTOMY  2011   EYE SURGERY     Sty removal   IR IMAGING GUIDED PORT INSERTION  03/06/2022   IR REMOVAL TUN ACCESS W/ PORT W/O FL MOD SED  05/29/2022   LABIAL ADHESION  LYSIS  1999   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   TONSILLECTOMY  2012   Family History  Problem Relation Age of Onset   Arthritis Other        grandparent   Stroke Other    Hypertension Other    Diabetes Other        grandparent   Cancer - Other Other        Glioblastoma   Social History   Socioeconomic History   Marital status: Single    Spouse name: Not on file   Number of children: Not on file   Years of education: Not on file   Highest education level: Not on file  Occupational History   Not on file  Tobacco Use   Smoking status: Some Days    Current packs/day: 1.00    Types: Cigarettes   Smokeless tobacco: Never   Tobacco comments:    1 pack twice a week  Vaping Use   Vaping status: Never Used  Substance and Sexual Activity   Alcohol use: Yes    Alcohol/week: 0.0 standard drinks of alcohol    Comment: occ   Drug use: Yes    Types: Marijuana    Comment: sometimes   Sexual activity: Yes  Other Topics Concern   Not on file  Social History Narrative   Lives with mom in a one story home.  Education: 2 years of college.    Social Determinants of Health   Financial Resource Strain: Low Risk  (01/28/2022)   Received from [facility], [facility]   Overall Financial Resource Strain (CARDIA)    Difficulty of Paying Living Expenses: Not very hard  Food Insecurity: No Food Insecurity (01/16/2023)   Hunger Vital Sign    Worried About Running Out of Food in the Last Year: Never true    Ran Out of Food in the Last Year: Never true  Transportation Needs: No Transportation Needs (01/16/2023)   PRAPARE - Administrator, Civil Service (Medical): No    Lack of Transportation (Non-Medical): No  Physical Activity: Not on file  Stress: No Stress Concern Present (01/28/2022)   Received from [facility], [facility] - Occupational Stress Questionnaire    Feeling of Stress : Only  a little  Social Connections: Unknown (11/30/2021)   Received from [facility], [facility]   Social Network    Social Network: Not on file  Intimate Partner Violence: Not At Risk (03/28/2023)   Received from [facility]   HITS    Over the last 12 months how often did your partner physically hurt you?: 1    Over the last 12 months how often  did your partner insult you or talk down to you?: 1    Over the last 12 months how often did your partner threaten you with physical harm?: 1    Over the last 12 months how often did your partner scream or curse at you?: 1   Review of Systems  Constitutional:  Negative for chills and fever.  HENT: Negative.    Eyes: Negative.   Respiratory: Negative.    Gastrointestinal: Negative.  Negative for constipation, diarrhea, nausea and vomiting.  Genitourinary: Negative.   Musculoskeletal:  Positive for back pain and joint pain.  Skin: Negative.   Endo/Heme/Allergies: Negative.   Psychiatric/Behavioral: Negative.      Physical Exam: There were no vitals taken for this visit. Physical Exam Constitutional:      Appearance: Normal appearance.  Eyes:     Pupils: Pupils are equal, round, and reactive to light.  Cardiovascular:     Rate and Rhythm: Normal rate and regular rhythm.  Pulmonary:     Effort: Pulmonary effort is normal.  Abdominal:     General: Bowel sounds are normal.  Musculoskeletal:        General: Normal range of motion.  Skin:    General: Skin is warm.  Neurological:     General: No focal deficit present.     Mental Status: She is alert. Mental status is at baseline.  Psychiatric:        Mood and Affect: Mood normal.        Behavior: Behavior normal.        Thought Content: Thought content normal.        Judgment: Judgment normal.      Lab results: No results found for this or any previous visit (from the past 24 hour(s)).  Imaging results:  No results found.   Assessment & Plan: Patient admitted to sickle  cell day infusion center for management of pain crisis.  Patient is opiate tolerant  Initiate IV dilaudid PCA.  IV fluids, 0.45% saline at 100 ml/hr Toradol 15 mg IV times one dose Tylenol 1000 mg by mouth times one dose Review CBC with differential, complete metabolic panel, and reticulocytes as results become available.  Pain intensity will be reevaluated in context of functioning and relationship to baseline as care progresses If pain intensity remains elevated and/or sudden change in hemodynamic stability transition to inpatient services for higher level of care.      [name]  APRN, MSN, FNP-C Patient Care [facility] [address] [place], [place] [address] [phone]  04/26/2023, 9:50 AM

## 2023-04-30 ENCOUNTER — Telehealth: Payer: Self-pay | Admitting: Family Medicine

## 2023-04-30 NOTE — Telephone Encounter (Signed)
Called patient to inform her of [place] [employer] being removed from the market. Pt states she has not take Oxbryta in 2 weeks. Had labs done on 9/23 while in the day hospital. She just received an order of Oxbryta in the mail, but will not take.   Please advise if patient needs to come in for labs after not taking for 2 weeks.

## 2023-05-18 ENCOUNTER — Telehealth (HOSPITAL_COMMUNITY): Payer: Self-pay

## 2023-05-18 ENCOUNTER — Non-Acute Institutional Stay (HOSPITAL_COMMUNITY)
Admission: AD | Admit: 2023-05-18 | Discharge: 2023-05-18 | Disposition: A | Discharge status: Home / Self Care | Payer: Medicare Other | Source: Ambulatory Visit | Attending: Internal Medicine | Admitting: Internal Medicine

## 2023-05-18 DIAGNOSIS — F1120 Opioid dependence, uncomplicated: Secondary | ICD-10-CM | POA: Diagnosis not present

## 2023-05-18 DIAGNOSIS — G894 Chronic pain syndrome: Secondary | ICD-10-CM | POA: Diagnosis not present

## 2023-05-18 DIAGNOSIS — D5700 Hb-SS disease with crisis, unspecified: Secondary | ICD-10-CM | POA: Diagnosis present

## 2023-05-18 DIAGNOSIS — D638 Anemia in other chronic diseases classified elsewhere: Secondary | ICD-10-CM | POA: Diagnosis not present

## 2023-05-18 LAB — CBC WITH DIFFERENTIAL/PLATELET
Abs Immature Granulocytes: 0.06 10*3/uL (ref 0.00–0.07)
Basophils Absolute: 0.1 10*3/uL (ref 0.0–0.1)
Basophils Relative: 1 %
Eosinophils Absolute: 0 10*3/uL (ref 0.0–0.5)
Eosinophils Relative: 0 %
HCT: 20.5 % — ABNORMAL LOW (ref 36.0–46.0)
Hemoglobin: 7.3 g/dL — ABNORMAL LOW (ref 12.0–15.0)
Immature Granulocytes: 1 %
Lymphocytes Relative: 29 %
Lymphs Abs: 3 10*3/uL (ref 0.7–4.0)
MCH: 37.6 pg — ABNORMAL HIGH (ref 26.0–34.0)
MCHC: 35.6 g/dL (ref 30.0–36.0)
MCV: 105.7 fL — ABNORMAL HIGH (ref 80.0–100.0)
Monocytes Absolute: 1.7 10*3/uL — ABNORMAL HIGH (ref 0.1–1.0)
Monocytes Relative: 16 %
Neutro Abs: 5.5 10*3/uL (ref 1.7–7.7)
Neutrophils Relative %: 53 %
Platelets: 472 10*3/uL — ABNORMAL HIGH (ref 150–400)
RBC: 1.94 MIL/uL — ABNORMAL LOW (ref 3.87–5.11)
RDW: 22.7 % — ABNORMAL HIGH (ref 11.5–15.5)
WBC: 10.3 10*3/uL (ref 4.0–10.5)
nRBC: 1.5 % — ABNORMAL HIGH (ref 0.0–0.2)

## 2023-05-18 LAB — COMPREHENSIVE METABOLIC PANEL
ALT: 31 U/L (ref 0–44)
AST: 39 U/L (ref 15–41)
Albumin: 5 g/dL (ref 3.5–5.0)
Alkaline Phosphatase: 81 U/L (ref 38–126)
Anion gap: 8 (ref 5–15)
BUN: 8 mg/dL (ref 6–20)
CO2: 23 mmol/L (ref 22–32)
Calcium: 9.5 mg/dL (ref 8.9–10.3)
Chloride: 107 mmol/L (ref 98–111)
Creatinine, Ser: <0.3 mg/dL — ABNORMAL LOW (ref 0.44–1.00)
Glucose, Bld: 99 mg/dL (ref 70–99)
Potassium: 3.8 mmol/L (ref 3.5–5.1)
Sodium: 138 mmol/L (ref 135–145)
Total Bilirubin: 3.1 mg/dL — ABNORMAL HIGH (ref 0.3–1.2)
Total Protein: 8.1 g/dL (ref 6.5–8.1)

## 2023-05-18 LAB — RETICULOCYTES
RBC.: 2.12 MIL/uL — ABNORMAL LOW (ref 3.87–5.11)
Retic Ct Pct: >30 % — ABNORMAL HIGH (ref 0.4–3.1)

## 2023-05-18 LAB — PREGNANCY, URINE: Preg Test, Ur: NEGATIVE

## 2023-05-18 MED ORDER — DIPHENHYDRAMINE HCL 25 MG PO CAPS
25.0000 mg | ORAL_CAPSULE | ORAL | Status: DC | PRN
  Administered 2023-05-18 (×2): 25 mg via ORAL
  Filled 2023-05-18 (×2): qty 1

## 2023-05-18 MED ORDER — SODIUM CHLORIDE 0.45 % IV SOLN: INTRAVENOUS | Status: DC

## 2023-05-18 MED ORDER — KETOROLAC TROMETHAMINE 30 MG/ML IJ SOLN
15.0000 mg | Freq: Once | INTRAMUSCULAR | Status: AC
  Administered 2023-05-18: 15 mg via INTRAVENOUS
  Filled 2023-05-18: qty 1

## 2023-05-18 MED ORDER — SENNOSIDES-DOCUSATE SODIUM 8.6-50 MG PO TABS: 1.0000 | ORAL_TABLET | Freq: Two times a day (BID) | ORAL | Status: DC

## 2023-05-18 MED ORDER — SODIUM CHLORIDE 0.9% FLUSH: 9.0000 mL | INTRAVENOUS | Status: DC | PRN

## 2023-05-18 MED ORDER — NALOXONE HCL 0.4 MG/ML IJ SOLN: 0.4000 mg | INTRAMUSCULAR | Status: DC | PRN

## 2023-05-18 MED ORDER — HYDROMORPHONE 1 MG/ML IV SOLN
INTRAVENOUS | Status: DC
  Administered 2023-05-18: 30 mg via INTRAVENOUS
  Administered 2023-05-18: 12.5 mg via INTRAVENOUS
  Filled 2023-05-18: qty 30

## 2023-05-18 MED ORDER — ACETAMINOPHEN 500 MG PO TABS
1000.0000 mg | ORAL_TABLET | Freq: Once | ORAL | Status: AC
  Administered 2023-05-18: 1000 mg via ORAL
  Filled 2023-05-18: qty 2

## 2023-05-18 MED ORDER — ONDANSETRON HCL 4 MG/2ML IJ SOLN
4.0000 mg | Freq: Four times a day (QID) | INTRAMUSCULAR | Status: DC | PRN
  Administered 2023-05-18: 4 mg via INTRAVENOUS
  Filled 2023-05-18: qty 2

## 2023-05-18 MED ORDER — POLYETHYLENE GLYCOL 3350 17 G PO PACK: 17.0000 g | PACK | Freq: Every day | ORAL | Status: DC | PRN

## 2023-05-18 NOTE — Telephone Encounter (Signed)
Pt called day hospital wanting to come in today for sickle cell pain treatment. Pt reports 9/10 generalized pain. Pt states that she has not taken anything for pain, and is out of home medications. Per pt she has not sent in refill request as she will be getting medications from a new provider. Pt denies being to the ER recently. Pt screened for COVID and denies symptoms and exposures. Pt denies fever, chest pain, N/V/D, and abdominal pain. [place] Hollis, FNP notified and per provider pt can come to day hospital today. Pt notified and verbalized understanding. Pt states that she will need to use taxi transportation services today.

## 2023-05-18 NOTE — Progress Notes (Signed)
Patient admitted to the day infusion hospital for sickle cell pain. Initially, patient reported generalized pain rated 9/10. For pain management, patient placed on Sickle Cell Dose Dilaudid PCA, given IV Toradol, PO Tylenol and hydrated with IV fluids. At discharge, patient rated pain at 5/10. Vital signs stable. AVS offered but patient refused. Patient alert, oriented and ambulatory at discharge.

## 2023-05-18 NOTE — Discharge Summary (Signed)
[facility] Discharge Summary   Patient ID: Sandra Mckay MRN: [id number] DOB/AGE: [birth year] 27 y.o.  Admit date: 05/18/2023 Discharge date: 05/18/2023  Primary Care Physician:  Massie Maroon, FNP  Admission Diagnoses:  Active Problems:   Sickle cell pain crisis [facility])   Discharge Medications:  Allergies as of 05/18/2023       Reactions   Latex Rash   Wound Dressing Adhesive Rash        Medication List     TAKE these medications    Benadryl Allergy 25 mg capsule Generic drug: diphenhydrAMINE Take 50 mg by mouth every 6 (six) hours as needed for itching.   ergocalciferol 1.25 MG (50000 UT) capsule Commonly known as: Drisdol Take 1 capsule (50,000 Units total) by mouth once a week.   folic acid 1 MG tablet Commonly known as: FOLVITE Take 1 tablet (1 mg total) by mouth daily.   gabapentin 300 MG capsule Commonly known as: NEURONTIN Take 1 capsule (300 mg total) by mouth 2 (two) times daily.   hydroxyurea 500 MG capsule Commonly known as: HYDREA Take 3 capsules (1,500 mg total) by mouth daily. TAKE 3 CAPSULES (1,500 MG TOTAL) BY MOUTH DAILY.   hydrOXYzine 25 MG tablet Commonly known as: ATARAX Take 12.5 mg by mouth 2 (two) times daily as needed for itching.   ondansetron 4 MG tablet Commonly known as: Zofran Take 1 tablet (4 mg total) by mouth every 8 (eight) hours as needed for nausea or vomiting.   Oxbryta 500 MG Tabs tablet Generic drug: voxelotor Take 1,000 mg by mouth daily.   oxyCODONE 15 MG immediate release tablet Commonly known as: ROXICODONE Take 1 tablet (15 mg total) by mouth every 6 (six) hours as needed for pain.   QUEtiapine 100 MG tablet Commonly known as: SEROQUEL Take 100 mg by mouth at bedtime.   QUEtiapine 25 MG tablet Commonly known as: SEROQUEL TAKE 1 TABLET BY MOUTH EVERYDAY AT BEDTIME   rivaroxaban 20 MG Tabs tablet Commonly known as: XARELTO Take 1 tablet (20 mg total) by mouth daily with supper.    tiZANidine 4 MG tablet Commonly known as: ZANAFLEX Take 4 mg by mouth every 8 (eight) hours as needed for muscle spasms.   Xtampza ER 13.5 MG C12a Generic drug: oxyCODONE ER Take 1 capsule by mouth every 12 (twelve) hours.         Consults:  None  Significant Diagnostic Studies:  No results found.  History of present illness:  Sandra Mckay is a 27 year old female with a medical history significant for sickle cell disease, chronic pain syndrome [facility] Course:  For complete details please refer to admission H and P, but in brief, ***  Physical Exam at Discharge:  BP (!) 91/55   Pulse 71   Temp 98.2 F (36.8 C) (Temporal)   Resp 10   LMP 05/03/2023   SpO2 92%   Physical Exam Constitutional:      Appearance: Normal appearance.  Eyes:     Pupils: Pupils are equal, round, and reactive to light.  Cardiovascular:     Rate and Rhythm: Normal rate.  Pulmonary:     Effort: Pulmonary effort is normal.  Musculoskeletal:        General: Normal range of motion.  Skin:    General: Skin is warm.  Neurological:     General: No focal deficit present.     Mental Status: She is alert. Mental status is at baseline.  Psychiatric:        Mood and Affect: Mood normal.        Behavior: Behavior normal.        Thought Content: Thought content normal.        Judgment: Judgment normal.     Disposition at Discharge: Discharge disposition: 01-Home or Self Care       Discharge Orders:   Condition at Discharge:   Stable  Time spent on Discharge:  Greater than 30 minutes.  Signed: [name]  APRN, MSN, FNP-C Patient Care [facility] [address] [place], [place] [address] [phone]  05/18/2023, 5:38 PM

## 2023-05-18 NOTE — H&P (Signed)
[facility] History and Physical   Date: 05/20/2023  Patient name: Sandra Mckay Medical record number: [id number] Date of birth: 1996-03-27 Age: 27 y.o. Gender: female PCP: Massie Maroon, FNP  Attending physician: No att. providers found  Chief Complaint: Sickle cell pain   History of Present Illness: Sandra Mckay is a 27 year old female with a medical history significant for sickle cell disease, chronic pain syndrome, opiate dependence and tolerance, and anemia of chronic disease that presents complaints of generalized pain that is consistent with her pain crisis.  Patient states that pain intensity increased overnight unrelieved by her home medications.  Patient states that she is out of home pain medications, but is send I will request to her primary provider.  Patient rates her pain as 9/10 and aching.  She denies any fever, chills, chest pain, or shortness of breath.  No urinary symptoms, nausea, vomiting no sick contacts or recent travel.  Meds: No medications prior to admission.    Allergies: Latex and Wound dressing adhesive Past Medical History:  Diagnosis Date   Acute cystitis without hematuria 10/21/2019   Blood transfusion without reported diagnosis    Bronchitis    Chickenpox    Depression    Elevated ferritin level 05/2019   Heart murmur    Nausea without vomiting 09/09/2015   Pulmonary hypertension (HCC)    Sickle cell anemia (HCC)    Sickle cell disease, type SS (HCC)    Sickle cell pain crisis (HCC) 12/05/2016   Thrombocytosis 05/2019   Urinary tract infection    Vitamin D deficiency    Past Surgical History:  Procedure Laterality Date   CHOLECYSTECTOMY  2011   EYE SURGERY     Sty removal   IR IMAGING GUIDED PORT INSERTION  03/06/2022   IR REMOVAL TUN ACCESS W/ PORT W/O FL MOD SED  05/29/2022   LABIAL ADHESION LYSIS  1999   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   TONSILLECTOMY  2012   Family History   Problem Relation Age of Onset   Arthritis Other        grandparent   Stroke Other    Hypertension Other    Diabetes Other        grandparent   Cancer - Other Other        Glioblastoma   Social History   Socioeconomic History   Marital status: Single    Spouse name: Not on file   Number of children: Not on file   Years of education: Not on file   Highest education level: Not on file  Occupational History   Not on file  Tobacco Use   Smoking status: Some Days    Current packs/day: 1.00    Types: Cigarettes   Smokeless tobacco: Never   Tobacco comments:    1 pack twice a week  Vaping Use   Vaping status: Never Used  Substance and Sexual Activity   Alcohol use: Yes    Alcohol/week: 0.0 standard drinks of alcohol    Comment: occ   Drug use: Yes    Types: Marijuana    Comment: sometimes   Sexual activity: Yes  Other Topics Concern   Not on file  Social History Narrative   Lives with mom in a one story home.  Education: 2 years of college.    Social Determinants of Health   Financial Resource Strain: Low Risk  (01/28/2022)   Received from [facility], [facility]   Overall Financial Resource Strain (CARDIA)    Difficulty of Paying Living Expenses: Not very hard  Food Insecurity: No Food Insecurity (01/16/2023)   Hunger Vital Sign    Worried About Running Out of Food in the Last Year: Never true    Ran Out of Food in the Last Year: Never true  Transportation Needs: No Transportation Needs (01/16/2023)   PRAPARE - Administrator, Civil Service (Medical): No    Lack of Transportation (Non-Medical): No  Physical Activity: Not on file  Stress: No Stress Concern Present (01/28/2022)   Received from [facility], [facility] of Occupational Health - Occupational Stress Questionnaire    Feeling of Stress : Only a little  Social Connections: Unknown (11/30/2021)   Received from [facility], [facility]   Social Network    Social  Network: Not on file  Intimate Partner Violence: Not At Risk (03/28/2023)   Received from [facility]   HITS    Over the last 12 months how often did your partner physically hurt you?: 1    Over the last 12 months how often did your partner insult you or talk down to you?: 1    Over the last 12 months how often did your partner threaten you with physical harm?: 1    Over the last 12 months how often did your partner scream or curse at you?: 1   Review of Systems  Constitutional: Negative.   HENT: Negative.    Eyes: Negative.   Respiratory: Negative.    Cardiovascular: Negative.   Gastrointestinal: Negative.   Genitourinary: Negative.   Musculoskeletal:  Positive for back pain and joint pain.  Skin: Negative.   Neurological: Negative.   Psychiatric/Behavioral: Negative.      Physical Exam: Blood pressure (!) 91/55, pulse 71, temperature 98.2 F (36.8 C), temperature source Temporal, resp. rate 10, last menstrual period 05/03/2023, SpO2 92%. Physical Exam Constitutional:      Appearance: Normal appearance.  Eyes:     Pupils: Pupils are equal, round, and reactive to light.  Cardiovascular:     Rate and Rhythm: Normal rate and regular rhythm.  Pulmonary:     Effort: Pulmonary effort is normal.  Abdominal:     General: Bowel sounds are normal.  Skin:    General: Skin is warm.  Neurological:     General: No focal deficit present.     Mental Status: She is alert. Mental status is at baseline.  Psychiatric:        Mood and Affect: Mood normal.        Behavior: Behavior normal.        Thought Content: Thought content normal.        Judgment: Judgment normal.      Lab results: No results found for this or any previous visit (from the past 24 hour(s)).  Imaging results:  No results found.   Assessment & Plan: Patient admitted to sickle cell day infusion center for management of pain crisis.  Patient is opiate tolerant Initiate IV dilaudid PCA.  Toradol 15 mg IV  times one dose Tylenol 1000 mg by mouth times one dose Review CBC with differential, complete metabolic panel, and reticulocytes as results become available. Pain intensity will be reevaluated in context of functioning and relationship to baseline as care progresses If pain intensity remains elevated and/or sudden change in hemodynamic stability transition to inpatient services for higher level of care.  [name]  APRN, MSN, FNP-C Patient Care [facility] [address] [place], [place] [address] [phone]  05/20/2023, 1:23 PM

## 2023-06-09 ENCOUNTER — Telehealth (HOSPITAL_COMMUNITY): Payer: Self-pay

## 2023-06-09 ENCOUNTER — Non-Acute Institutional Stay (HOSPITAL_COMMUNITY)
Admission: AD | Admit: 2023-06-09 | Discharge: 2023-06-09 | Disposition: A | Discharge status: Home / Self Care | Payer: Medicare Other | Source: Ambulatory Visit | Attending: Internal Medicine | Admitting: Internal Medicine

## 2023-06-09 DIAGNOSIS — F17210 Nicotine dependence, cigarettes, uncomplicated: Secondary | ICD-10-CM | POA: Diagnosis not present

## 2023-06-09 DIAGNOSIS — G894 Chronic pain syndrome: Secondary | ICD-10-CM | POA: Diagnosis not present

## 2023-06-09 DIAGNOSIS — Z79891 Long term (current) use of opiate analgesic: Secondary | ICD-10-CM | POA: Diagnosis not present

## 2023-06-09 DIAGNOSIS — D638 Anemia in other chronic diseases classified elsewhere: Secondary | ICD-10-CM | POA: Insufficient documentation

## 2023-06-09 DIAGNOSIS — D5700 Hb-SS disease with crisis, unspecified: Secondary | ICD-10-CM | POA: Diagnosis not present

## 2023-06-09 LAB — CBC WITH DIFFERENTIAL/PLATELET
Abs Immature Granulocytes: 0.05 10*3/uL (ref 0.00–0.07)
Basophils Absolute: 0.1 10*3/uL (ref 0.0–0.1)
Basophils Relative: 1 %
Eosinophils Absolute: 0.1 10*3/uL (ref 0.0–0.5)
Eosinophils Relative: 1 %
HCT: 20.6 % — ABNORMAL LOW (ref 36.0–46.0)
Hemoglobin: 7.3 g/dL — ABNORMAL LOW (ref 12.0–15.0)
Immature Granulocytes: 1 %
Lymphocytes Relative: 25 %
Lymphs Abs: 2.4 10*3/uL (ref 0.7–4.0)
MCH: 39.7 pg — ABNORMAL HIGH (ref 26.0–34.0)
MCHC: 35.4 g/dL (ref 30.0–36.0)
MCV: 112 fL — ABNORMAL HIGH (ref 80.0–100.0)
Monocytes Absolute: 1.7 10*3/uL — ABNORMAL HIGH (ref 0.1–1.0)
Monocytes Relative: 18 %
Neutro Abs: 5.1 10*3/uL (ref 1.7–7.7)
Neutrophils Relative %: 54 %
Platelets: 417 10*3/uL — ABNORMAL HIGH (ref 150–400)
RBC: 1.84 MIL/uL — ABNORMAL LOW (ref 3.87–5.11)
RDW: 23.1 % — ABNORMAL HIGH (ref 11.5–15.5)
WBC: 9.3 10*3/uL (ref 4.0–10.5)
nRBC: 3 % — ABNORMAL HIGH (ref 0.0–0.2)

## 2023-06-09 LAB — COMPREHENSIVE METABOLIC PANEL
ALT: 23 U/L (ref 0–44)
AST: 35 U/L (ref 15–41)
Albumin: 4.7 g/dL (ref 3.5–5.0)
Alkaline Phosphatase: 70 U/L (ref 38–126)
Anion gap: 7 (ref 5–15)
BUN: 8 mg/dL (ref 6–20)
CO2: 24 mmol/L (ref 22–32)
Calcium: 9.4 mg/dL (ref 8.9–10.3)
Chloride: 107 mmol/L (ref 98–111)
Creatinine, Ser: <0.3 mg/dL — ABNORMAL LOW (ref 0.44–1.00)
Glucose, Bld: 89 mg/dL (ref 70–99)
Potassium: 4.6 mmol/L (ref 3.5–5.1)
Sodium: 138 mmol/L (ref 135–145)
Total Bilirubin: 2.3 mg/dL — ABNORMAL HIGH (ref <1.2)
Total Protein: 7.6 g/dL (ref 6.5–8.1)

## 2023-06-09 LAB — RETICULOCYTES
Immature Retic Fract: 40.8 % — ABNORMAL HIGH (ref 2.3–15.9)
RBC.: 1.8 MIL/uL — ABNORMAL LOW (ref 3.87–5.11)
Retic Count, Absolute: 453.9 10*3/uL — ABNORMAL HIGH (ref 19.0–186.0)
Retic Ct Pct: 25.2 % — ABNORMAL HIGH (ref 0.4–3.1)

## 2023-06-09 LAB — PREGNANCY, URINE: Preg Test, Ur: NEGATIVE

## 2023-06-09 MED ORDER — SODIUM CHLORIDE 0.9% FLUSH: 9.0000 mL | INTRAVENOUS | Status: DC | PRN

## 2023-06-09 MED ORDER — ONDANSETRON HCL 4 MG/2ML IJ SOLN
4.0000 mg | Freq: Four times a day (QID) | INTRAMUSCULAR | Status: DC | PRN
  Administered 2023-06-09: 4 mg via INTRAVENOUS
  Filled 2023-06-09: qty 2

## 2023-06-09 MED ORDER — NALOXONE HCL 0.4 MG/ML IJ SOLN: 0.4000 mg | INTRAMUSCULAR | Status: DC | PRN

## 2023-06-09 MED ORDER — KETOROLAC TROMETHAMINE 30 MG/ML IJ SOLN
15.0000 mg | Freq: Once | INTRAMUSCULAR | Status: AC
  Administered 2023-06-09: 15 mg via INTRAVENOUS
  Filled 2023-06-09: qty 1

## 2023-06-09 MED ORDER — HYDROMORPHONE 1 MG/ML IV SOLN
INTRAVENOUS | Status: DC
  Administered 2023-06-09: 30 mg via INTRAVENOUS
  Administered 2023-06-09: 16 mg via INTRAVENOUS
  Filled 2023-06-09: qty 30

## 2023-06-09 MED ORDER — ACETAMINOPHEN 500 MG PO TABS
1000.0000 mg | ORAL_TABLET | Freq: Once | ORAL | Status: AC
  Administered 2023-06-09: 1000 mg via ORAL
  Filled 2023-06-09: qty 2

## 2023-06-09 MED ORDER — DIPHENHYDRAMINE HCL 25 MG PO CAPS
25.0000 mg | ORAL_CAPSULE | ORAL | Status: DC | PRN
  Administered 2023-06-09: 25 mg via ORAL
  Filled 2023-06-09: qty 1

## 2023-06-09 NOTE — Progress Notes (Signed)
Pt is admitted to the day hospital today for sickle cell pain treatment. On arrival, pt rates pain 7/10 to bilateral hips. Pt received Dilaudid PCA, IV Toradol, and IV Zofran via PIV. Pt also received PO Tylenol and Benadryl. At discharge, pt rates pain 5/10. AVS offered, but pt declined. Pt is alert, oriented, and ambulatory at discharge.

## 2023-06-09 NOTE — Telephone Encounter (Signed)
Pt called day hospital wanting to come in today for sickle cell pain treatment. Pt reports 7/10 pain to bilateral hips and legs. Pt states he took Oxycodone 15 mg at 5 AM. Pt reports being in the ER last week. Pt screened for COVID and denies symptoms and exposures. Pt denies fever, chest pain, N/V/D. Pt endorses having some abdominal pain, and says that this is from her menstrual cycle. Nilda Simmer, FNP notified. Per provider pt can come to the day hospital today. Pt notified and verbalized understanding. Pt states that her mom will be her transportation to and from the clinic today.

## 2023-06-09 NOTE — BH Specialist Note (Signed)
Integrated Behavioral Health Progress Note  06/09/2023 Name: Shealeigh Dunstan MRN: [id number] DOB: 1996-06-23 Evy Lutterman is a 27 y.o. year old female who sees Massie Maroon, FNP for primary care. LCSW was initially consulted to assist with mental health concerns.  Interpreter: No.   Interpreter Name & Language: none  Assessment: Patient experiencing bipolar disorder and anxiety. [name] previously was following for Integrated Behavioral Health (IBH) counseling.   Ongoing Intervention: [name] met with patient at bedside in the day hospital. Advised patient that [name] is leaving the practice at the end of the month. Reflected on patient's progress in counseling sessions.  Abigail Butts, LCSW [facility] [phone]

## 2023-06-09 NOTE — H&P (Signed)
[facility] History and Physical   Date: 06/09/2023  Patient name: Sandra Mckay Medical record number: [id number] Date of birth: 1996-01-06 Age: 27 y.o. Gender: female PCP: Massie Maroon, FNP  Attending physician: [name], MD  Chief Complaint: Sickle cell pain   History of present illness:  Sandra Mckay is a 27 year old female with a medical history significant for sickle cell disease, chronic pain syndrome, opiate dependence and tolerance, and anemia of chronic disease that presents with complaints of generalized pain that is consistent with her typical crisis.  Patient states that pain intensity has been elevated over the past 4 days and unrelieved by her home medications.  Patient last had oxycodone this a.m. without sustained relief.  Patient rates pain as 8/10, intermittent, and aching.  She denies fever, chills, chest pain, or shortness of breath.  No urinary symptoms, nausea, vomiting, or diarrhea.  No sick contacts or recent travel.  Meds: Medications Prior to Admission  Medication Sig Dispense Refill Last Dose   ergocalciferol (DRISDOL) 1.25 MG (50000 UT) capsule Take 1 capsule (50,000 Units total) by mouth once a week. 12 capsule 2 Past Week   gabapentin (NEURONTIN) 300 MG capsule Take 1 capsule (300 mg total) by mouth 2 (two) times daily. 60 capsule 5 06/08/2023   ondansetron (ZOFRAN) 4 MG tablet Take 1 tablet (4 mg total) by mouth every 8 (eight) hours as needed for nausea or vomiting. 30 tablet 3 Past Week   oxyCODONE (ROXICODONE) 15 MG immediate release tablet Take 1 tablet (15 mg total) by mouth every 6 (six) hours as needed for pain. 60 tablet 0 06/09/2023   oxyCODONE ER (XTAMPZA ER) 13.5 MG C12A Take 1 capsule by mouth every 12 (twelve) hours. 14 capsule 0 06/08/2023   QUEtiapine (SEROQUEL) 100 MG tablet Take 100 mg by mouth at bedtime.   06/08/2023   rivaroxaban (XARELTO) 20 MG TABS tablet Take 1 tablet (20 mg total) by mouth daily with  supper. 30 tablet 3 06/08/2023   diphenhydrAMINE (BENADRYL ALLERGY) 25 mg capsule Take 50 mg by mouth every 6 (six) hours as needed for itching. (Patient not taking: Reported on 02/02/2023)      folic acid (FOLVITE) 1 MG tablet Take 1 tablet (1 mg total) by mouth daily. 90 tablet 3 Unknown   hydroxyurea (HYDREA) 500 MG capsule Take 3 capsules (1,500 mg total) by mouth daily. TAKE 3 CAPSULES (1,500 MG TOTAL) BY MOUTH DAILY. (Patient not taking: Reported on 02/02/2023) 270 capsule 1    hydrOXYzine (ATARAX) 25 MG tablet Take 12.5 mg by mouth 2 (two) times daily as needed for itching.   Unknown   QUEtiapine (SEROQUEL) 25 MG tablet TAKE 1 TABLET BY MOUTH EVERYDAY AT BEDTIME (Patient not taking: Reported on 01/05/2023) 90 tablet 3    tiZANidine (ZANAFLEX) 4 MG tablet Take 4 mg by mouth every 8 (eight) hours as needed for muscle spasms.   Unknown   voxelotor (OXBRYTA) 500 MG TABS tablet Take 1,000 mg by mouth daily. 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HCC)  Sickle cell pain crisis (HCC) 12/05/2016   Thrombocytosis 05/2019   Urinary tract infection    Vitamin D deficiency    Past Surgical History:  Procedure Laterality Date   CHOLECYSTECTOMY  2011   EYE SURGERY     Sty removal   IR IMAGING GUIDED PORT INSERTION  03/06/2022   IR REMOVAL TUN ACCESS W/ PORT W/O FL MOD SED  05/29/2022   LABIAL ADHESION LYSIS  1999   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   TONSILLECTOMY  2012   Family History  Problem Relation Age of Onset   Arthritis Other        grandparent   Stroke Other    Hypertension  Other    Diabetes Other        grandparent   Cancer - Other Other        Glioblastoma   Social History   Socioeconomic History   Marital status: Single    Spouse name: Not on file   Number of children: Not on file   Years of education: Not on file   Highest education level: Not on file  Occupational History   Not on file  Tobacco Use   Smoking status: Some Days    Current packs/day: 1.00    Types: Cigarettes   Smokeless tobacco: Never   Tobacco comments:    1 pack twice a week  Vaping Use   Vaping status: Never Used  Substance and Sexual Activity   Alcohol use: Yes    Alcohol/week: 0.0 standard drinks of alcohol    Comment: occ   Drug use: Yes    Types: Marijuana    Comment: sometimes   Sexual activity: Yes  Other Topics Concern   Not on file  Social History Narrative   Lives with mom in a one story home.  Education: 2 years of college.    Social Determinants of Health   Financial Resource Strain: Low Risk  (01/28/2022)   Received from [facility], [facility]   Overall Financial Resource Strain (CARDIA)    Difficulty of Paying Living Expenses: Not very hard  Food Insecurity: No Food Insecurity (01/16/2023)   Hunger Vital Sign    Worried About Running Out of Food in the Last Year: Never true    Ran Out of Food in the Last Year: Never true  Transportation Needs: No Transportation Needs (01/16/2023)   PRAPARE - Administrator, Civil Service (Medical): No    Lack of Transportation (Non-Medical): No  Physical Activity: Not on file  Stress: No Stress Concern Present (01/28/2022)   Received from [facility], [facility] of Occupational Health - Occupational Stress Questionnaire    Feeling of Stress : Only a little  Social Connections: Unknown (11/30/2021)   Received from [facility], [facility]   Social Network    Social Network: Not on file  Intimate Partner Violence: Not At Risk (06/05/2023)   Received from [facility]    HITS    Over the last 12 months how often did your partner physically hurt you?: 1    Over the last 12 months how often did your partner insult you or talk down to you?: 1    Over the last 12 months how often did your partner threaten you with physical harm?: 1    Over the last 12 months how often did your partner scream or curse at you?: 1  Review  of Systems  Constitutional: Negative.   HENT: Negative.    Eyes: Negative.   Respiratory: Negative.    Cardiovascular: Negative.   Genitourinary: Negative.   Musculoskeletal:  Positive for back pain and myalgias.  Skin: Negative.   Neurological: Negative.   Endo/Heme/Allergies: Negative.   Psychiatric/Behavioral: Negative.      Physical Exam: Blood pressure 114/63, temperature 98.9 F (37.2 C), temperature source Temporal, resp. rate 16, last menstrual period 06/06/2023, SpO2 97%. Physical Exam Constitutional:      Appearance: Normal appearance.  HENT:     Mouth/Throat:     Mouth: Mucous membranes are moist.     Pharynx: Oropharynx is clear.  Cardiovascular:     Rate and Rhythm: Normal rate and regular rhythm.     Heart sounds: Normal heart sounds.  Pulmonary:     Effort: Pulmonary effort is normal.     Breath sounds: Normal breath sounds.  Abdominal:     General: Bowel sounds are normal.     Palpations: Abdomen is soft.  Musculoskeletal:        General: Normal range of motion.  Skin:    General: Skin is warm.  Neurological:     General: No focal deficit present.     Mental Status: She is alert. Mental status is at baseline.  Psychiatric:        Mood and Affect: Mood normal.        Behavior: Behavior normal.        Thought Content: Thought content normal.        Judgment: Judgment normal.      Lab results: No results found for this or any previous visit (from the past 24 hour(s)).  Imaging results:  No results found.   Assessment & Plan: Patient admitted to sickle cell day infusion center for management of  pain crisis.  Patient is opiate naive Initiate IV dilaudid PCA. Toradol 15 mg IV times one dose Tylenol 1000 mg by mouth times one dose Review CBC with differential, complete metabolic panel, and reticulocytes as results become available.Pain intensity will be reevaluated in context of functioning and relationship to baseline as care progresses If pain intensity remains elevated and/or sudden change in hemodynamic stability transition to inpatient services for higher level of care.      [name]  APRN, MSN, FNP-C Patient Care [facility] [address] [place], [place] [address] [phone]  06/09/2023, 9:35 AM

## 2023-06-10 NOTE — Discharge Summary (Addendum)
[facility] Discharge Summary   Patient ID: Sandra Mckay MRN: [id number] DOB/AGE: 1995/12/07 27 y.o.  Admit date: 06/09/2023 Discharge date: 06/09/2023  Primary Care Physician:  Massie Maroon, FNP  Admission Diagnoses:  Principal Problem:   Sickle cell pain crisis [facility])   Discharge Medications:  Allergies as of 06/09/2023       Reactions   Latex Rash   Wound Dressing Adhesive Rash        Medication List     STOP taking these medications    Benadryl Allergy 25 mg capsule Generic drug: diphenhydrAMINE   ergocalciferol 1.25 MG (50000 UT) capsule Commonly known as: Drisdol   folic acid 1 MG tablet Commonly known as: FOLVITE   gabapentin 300 MG capsule Commonly known as: NEURONTIN   hydroxyurea 500 MG capsule Commonly known as: HYDREA   hydrOXYzine 25 MG tablet Commonly known as: ATARAX   ondansetron 4 MG tablet Commonly known as: Zofran   Oxbryta 500 MG Tabs tablet Generic drug: voxelotor   oxyCODONE 15 MG immediate release tablet Commonly known as: ROXICODONE   QUEtiapine 100 MG tablet Commonly known as: SEROQUEL   QUEtiapine 25 MG tablet Commonly known as: SEROQUEL   rivaroxaban 20 MG Tabs tablet Commonly known as: XARELTO   tiZANidine 4 MG tablet Commonly known as: ZANAFLEX   Xtampza ER 13.5 MG C12a Generic drug: oxyCODONE ER         Consults:  None  Significant Diagnostic Studies:  No results found.  History of present illness:  Sandra Mckay is a 27 year old female with a medical history significant for sickle cell disease, chronic pain syndrome, opiate dependence and tolerance, and anemia of chronic disease that presents with complaints of generalized pain that is consistent with her typical crisis.  Patient states that pain intensity has been elevated over the past 4 days and unrelieved by her home medications.  Patient last had oxycodone this a.m. without sustained relief.  Patient rates pain as 8/10,  intermittent, and aching.  She denies fever, chills, chest pain, or shortness of breath.  No urinary symptoms, nausea, vomiting, or diarrhea.  No sick contacts or recent travel.  [facility] Course: Patient admitted to sickle cell day infusion clinic for management of pain crisis.  Reviewed all laboratory values, consistent with her baseline. Pain managed with IV Dilaudid PCA, IV Toradol, Tylenol. Pain intensity decreased throughout admission and patient is requesting discharge home. She is alert, oriented, and ambulating without assistance.  Patient will discharge home in a hemodynamically stable condition.  Discharge instructions: Resume all home medications.   Follow up with PCP as previously  scheduled.   Discussed the importance of drinking 64 ounces of water daily, dehydration of red blood cells may lead further sickling.   Avoid all stressors that precipitate sickle cell pain crisis.     The patient was given clear instructions to go to ER or return to medical center if symptoms do not improve, worsen or new problems develop.  Physical exam at Discharge:  BP (!) 114/59 (BP Location: Left Arm)   Pulse 66   Temp 98.6 F (37 C) (Temporal)   Resp 14   LMP 06/06/2023   SpO2 96%   Physical Exam Constitutional:      Appearance: Normal appearance.  HENT:     Mouth/Throat:     Pharynx: Oropharynx is clear.  Eyes:     Pupils: Pupils are equal, round, and reactive to light.  Cardiovascular:  Rate and Rhythm: Normal rate.  Pulmonary:     Effort: Pulmonary effort is normal.     Breath sounds: Normal breath sounds.  Abdominal:     General: Bowel sounds are normal.     Palpations: Abdomen is soft.  Musculoskeletal:        General: Normal range of motion.  Skin:    General: Skin is warm.  Neurological:     General: No focal deficit present.     Mental Status: Mental status is at baseline.  Psychiatric:        Mood and Affect: Mood normal.         Behavior: Behavior normal.        Thought Content: Thought content normal.        Judgment: Judgment normal.     Disposition at Discharge: Discharge disposition: 01-Home or Self Care       Discharge Orders: Discharge Instructions     Discharge patient   Complete by: As directed    Discharge disposition: 01-Home or Self Care   Discharge patient date: 06/09/2023       Condition at Discharge:   Stable  Time spent on Discharge:  Greater than 30 minutes.  Signed: [name]  APRN, MSN, FNP-C Patient Care [facility] [address] [place], [place] [address] [phone]  06/10/2023, 4:59 AM  Evaluation and management procedures were performed by the Advanced Practitioner under my supervision and collaboration. I have reviewed the Advanced Practitioner's note and chart, and I agree with the management and plan.   Jeanann Lewandowsky, MD, MHA, CPE, [facility] P[facility] Patient [facility] [place], [place] [phone] 06/11/2023, 7:12 PM

## 2023-06-13 ENCOUNTER — Inpatient Hospital Stay (HOSPITAL_COMMUNITY)
Admission: EM | Admit: 2023-06-13 | Discharge: 2023-06-15 | DRG: 812 | Disposition: A | Discharge status: Home / Self Care | Payer: Medicare Other | Attending: Internal Medicine | Admitting: Internal Medicine

## 2023-06-13 ENCOUNTER — Other Ambulatory Visit: Payer: Self-pay

## 2023-06-13 ENCOUNTER — Encounter (HOSPITAL_COMMUNITY): Payer: Self-pay

## 2023-06-13 DIAGNOSIS — D5700 Hb-SS disease with crisis, unspecified: Principal | ICD-10-CM | POA: Diagnosis present

## 2023-06-13 DIAGNOSIS — F32A Depression, unspecified: Secondary | ICD-10-CM | POA: Diagnosis present

## 2023-06-13 DIAGNOSIS — F419 Anxiety disorder, unspecified: Secondary | ICD-10-CM | POA: Diagnosis present

## 2023-06-13 DIAGNOSIS — Z823 Family history of stroke: Secondary | ICD-10-CM

## 2023-06-13 DIAGNOSIS — Z7901 Long term (current) use of anticoagulants: Secondary | ICD-10-CM | POA: Diagnosis not present

## 2023-06-13 DIAGNOSIS — G894 Chronic pain syndrome: Secondary | ICD-10-CM | POA: Diagnosis present

## 2023-06-13 DIAGNOSIS — Z8249 Family history of ischemic heart disease and other diseases of the circulatory system: Secondary | ICD-10-CM

## 2023-06-13 DIAGNOSIS — D72829 Elevated white blood cell count, unspecified: Secondary | ICD-10-CM | POA: Diagnosis present

## 2023-06-13 DIAGNOSIS — F1120 Opioid dependence, uncomplicated: Secondary | ICD-10-CM | POA: Diagnosis present

## 2023-06-13 DIAGNOSIS — Z833 Family history of diabetes mellitus: Secondary | ICD-10-CM

## 2023-06-13 DIAGNOSIS — F319 Bipolar disorder, unspecified: Secondary | ICD-10-CM | POA: Diagnosis present

## 2023-06-13 DIAGNOSIS — D638 Anemia in other chronic diseases classified elsewhere: Secondary | ICD-10-CM | POA: Diagnosis present

## 2023-06-13 DIAGNOSIS — Z91040 Latex allergy status: Secondary | ICD-10-CM

## 2023-06-13 DIAGNOSIS — F17210 Nicotine dependence, cigarettes, uncomplicated: Secondary | ICD-10-CM | POA: Diagnosis present

## 2023-06-13 DIAGNOSIS — I272 Pulmonary hypertension, unspecified: Secondary | ICD-10-CM | POA: Diagnosis present

## 2023-06-13 DIAGNOSIS — G8929 Other chronic pain: Secondary | ICD-10-CM | POA: Diagnosis present

## 2023-06-13 DIAGNOSIS — Z9081 Acquired absence of spleen: Secondary | ICD-10-CM | POA: Diagnosis not present

## 2023-06-13 DIAGNOSIS — Z86711 Personal history of pulmonary embolism: Secondary | ICD-10-CM | POA: Diagnosis present

## 2023-06-13 DIAGNOSIS — Z79899 Other long term (current) drug therapy: Secondary | ICD-10-CM

## 2023-06-13 LAB — CBC WITH DIFFERENTIAL/PLATELET
Abs Immature Granulocytes: 0.08 10*3/uL — ABNORMAL HIGH (ref 0.00–0.07)
Basophils Absolute: 0.1 10*3/uL (ref 0.0–0.1)
Basophils Relative: 1 %
Eosinophils Absolute: 0.1 10*3/uL (ref 0.0–0.5)
Eosinophils Relative: 1 %
HCT: 21.4 % — ABNORMAL LOW (ref 36.0–46.0)
Hemoglobin: 7.5 g/dL — ABNORMAL LOW (ref 12.0–15.0)
Immature Granulocytes: 1 %
Lymphocytes Relative: 34 %
Lymphs Abs: 4.1 10*3/uL — ABNORMAL HIGH (ref 0.7–4.0)
MCH: 38.3 pg — ABNORMAL HIGH (ref 26.0–34.0)
MCHC: 35 g/dL (ref 30.0–36.0)
MCV: 109.2 fL — ABNORMAL HIGH (ref 80.0–100.0)
Monocytes Absolute: 1.8 10*3/uL — ABNORMAL HIGH (ref 0.1–1.0)
Monocytes Relative: 15 %
Neutro Abs: 6 10*3/uL (ref 1.7–7.7)
Neutrophils Relative %: 48 %
Platelets: 479 10*3/uL — ABNORMAL HIGH (ref 150–400)
RBC: 1.96 MIL/uL — ABNORMAL LOW (ref 3.87–5.11)
RDW: 22.5 % — ABNORMAL HIGH (ref 11.5–15.5)
WBC: 12.2 10*3/uL — ABNORMAL HIGH (ref 4.0–10.5)
nRBC: 2.4 % — ABNORMAL HIGH (ref 0.0–0.2)

## 2023-06-13 LAB — COMPREHENSIVE METABOLIC PANEL
ALT: 63 U/L — ABNORMAL HIGH (ref 0–44)
AST: 71 U/L — ABNORMAL HIGH (ref 15–41)
Albumin: 5.1 g/dL — ABNORMAL HIGH (ref 3.5–5.0)
Alkaline Phosphatase: 92 U/L (ref 38–126)
Anion gap: 8 (ref 5–15)
BUN: 8 mg/dL (ref 6–20)
CO2: 22 mmol/L (ref 22–32)
Calcium: 9.3 mg/dL (ref 8.9–10.3)
Chloride: 104 mmol/L (ref 98–111)
Creatinine, Ser: 0.35 mg/dL — ABNORMAL LOW (ref 0.44–1.00)
GFR, Estimated: >60 mL/min (ref >60)
Glucose, Bld: 98 mg/dL (ref 70–99)
Potassium: 3.9 mmol/L (ref 3.5–5.1)
Sodium: 134 mmol/L — ABNORMAL LOW (ref 135–145)
Total Bilirubin: 3.2 mg/dL — ABNORMAL HIGH (ref <1.2)
Total Protein: 7.6 g/dL (ref 6.5–8.1)

## 2023-06-13 LAB — RETICULOCYTES
Immature Retic Fract: 35.4 % — ABNORMAL HIGH (ref 2.3–15.9)
RBC.: 1.9 MIL/uL — ABNORMAL LOW (ref 3.87–5.11)
Retic Count, Absolute: 501.5 10*3/uL — ABNORMAL HIGH (ref 19.0–186.0)
Retic Ct Pct: 26.4 % — ABNORMAL HIGH (ref 0.4–3.1)

## 2023-06-13 LAB — HCG, SERUM, QUALITATIVE: Preg, Serum: NEGATIVE

## 2023-06-13 MED ORDER — QUETIAPINE FUMARATE 100 MG PO TABS
100.0000 mg | ORAL_TABLET | Freq: Every day | ORAL | Status: DC
  Administered 2023-06-13 – 2023-06-14 (×2): 100 mg via ORAL
  Filled 2023-06-13 (×2): qty 1

## 2023-06-13 MED ORDER — OXYCODONE HCL 5 MG PO TABS
15.0000 mg | ORAL_TABLET | ORAL | Status: DC | PRN
  Administered 2023-06-13: 15 mg via ORAL
  Filled 2023-06-13: qty 3

## 2023-06-13 MED ORDER — HYDROMORPHONE HCL 2 MG/ML IJ SOLN
2.0000 mg | INTRAMUSCULAR | Status: AC
  Administered 2023-06-13: 2 mg via INTRAVENOUS
  Filled 2023-06-13: qty 1

## 2023-06-13 MED ORDER — NALOXONE HCL 0.4 MG/ML IJ SOLN: 0.4000 mg | INTRAMUSCULAR | Status: DC | PRN

## 2023-06-13 MED ORDER — HYDROMORPHONE 1 MG/ML IV SOLN
INTRAVENOUS | Status: DC
Start: 2023-06-13 — End: 2023-06-15
  Administered 2023-06-13 – 2023-06-14 (×2): 30 mg via INTRAVENOUS
  Filled 2023-06-13 (×2): qty 30

## 2023-06-13 MED ORDER — DIPHENHYDRAMINE HCL 25 MG PO CAPS
25.0000 mg | ORAL_CAPSULE | ORAL | Status: DC | PRN
  Administered 2023-06-14: 25 mg via ORAL
  Filled 2023-06-13: qty 1

## 2023-06-13 MED ORDER — ONDANSETRON HCL 4 MG/2ML IJ SOLN
4.0000 mg | Freq: Four times a day (QID) | INTRAMUSCULAR | Status: DC | PRN
Start: 2023-06-13 — End: 2023-06-15
  Administered 2023-06-13: 4 mg via INTRAVENOUS
  Filled 2023-06-13: qty 2

## 2023-06-13 MED ORDER — HYDROMORPHONE HCL 2 MG/ML IJ SOLN
2.0000 mg | INTRAMUSCULAR | Status: AC
Start: 2023-06-13 — End: 2023-06-13
  Administered 2023-06-13: 2 mg via INTRAVENOUS
  Filled 2023-06-13: qty 1

## 2023-06-13 MED ORDER — SODIUM CHLORIDE 0.9% FLUSH
9.0000 mL | INTRAVENOUS | Status: DC | PRN
Start: 2023-06-13 — End: 2023-06-15

## 2023-06-13 MED ORDER — KETOROLAC TROMETHAMINE 15 MG/ML IJ SOLN
15.0000 mg | INTRAMUSCULAR | Status: AC
  Administered 2023-06-13: 15 mg via INTRAVENOUS
  Filled 2023-06-13: qty 1

## 2023-06-13 MED ORDER — OXYCODONE HCL ER 15 MG PO T12A
30.0000 mg | EXTENDED_RELEASE_TABLET | Freq: Two times a day (BID) | ORAL | Status: DC
  Administered 2023-06-13 – 2023-06-15 (×4): 30 mg via ORAL
  Filled 2023-06-13 (×4): qty 2

## 2023-06-13 MED ORDER — SENNOSIDES-DOCUSATE SODIUM 8.6-50 MG PO TABS
1.0000 | ORAL_TABLET | Freq: Two times a day (BID) | ORAL | Status: DC
  Administered 2023-06-13 – 2023-06-15 (×4): 1 via ORAL
  Filled 2023-06-13 (×4): qty 1

## 2023-06-13 MED ORDER — HYDROMORPHONE HCL 2 MG/ML IJ SOLN
2.0000 mg | INTRAMUSCULAR | Status: DC | PRN
Start: 2023-06-13 — End: 2023-06-13
  Administered 2023-06-13 (×2): 2 mg via INTRAVENOUS
  Filled 2023-06-13 (×2): qty 1

## 2023-06-13 MED ORDER — RIVAROXABAN 20 MG PO TABS
20.0000 mg | ORAL_TABLET | Freq: Every day | ORAL | Status: DC
  Administered 2023-06-13 – 2023-06-14 (×2): 20 mg via ORAL
  Filled 2023-06-13 (×2): qty 1

## 2023-06-13 MED ORDER — KETOROLAC TROMETHAMINE 15 MG/ML IJ SOLN
15.0000 mg | Freq: Four times a day (QID) | INTRAMUSCULAR | Status: DC
  Administered 2023-06-13 – 2023-06-14 (×5): 15 mg via INTRAVENOUS
  Filled 2023-06-13 (×5): qty 1

## 2023-06-13 MED ORDER — POLYETHYLENE GLYCOL 3350 17 G PO PACK: 17.0000 g | PACK | Freq: Every day | ORAL | Status: DC | PRN

## 2023-06-13 MED ORDER — SODIUM CHLORIDE 0.45 % IV SOLN: INTRAVENOUS | Status: AC

## 2023-06-13 MED ORDER — SODIUM CHLORIDE 0.9 % IV SOLN
12.5000 mg | Freq: Once | INTRAVENOUS | Status: AC
  Administered 2023-06-13: 12.5 mg via INTRAVENOUS
  Filled 2023-06-13: qty 12.5

## 2023-06-13 NOTE — ED Notes (Signed)
ED TO INPATIENT HANDOFF REPORT  Name/Age/Gender [name] 27 y.o. female  Code Status    Code Status Orders  (From admission, onward)           Start     Ordered   06/13/23 0840  Full code  Continuous       Question:  By:  Answer:  Consent: discussion documented in EHR   06/13/23 0843           Code Status History     Date Active Date Inactive Code Status Order ID Comments User Context   05/18/2023 1101 05/18/2023 2124 Full Code [id number]  Massie Maroon, FNP Inpatient   04/26/2023 0950 04/26/2023 2204 Full Code [id number]  Massie Maroon, FNP Inpatient   03/25/2023 1025 03/25/2023 2157 Full Code [id number]  Rometta Emery, MD Inpatient   02/10/2023 0911 02/10/2023 2148 Full Code [id number]  Massie Maroon, FNP Inpatient   01/14/2023 0219 01/16/2023 2228 Full Code [id number]  Briscoe Deutscher, MD ED   01/11/2023 0901 01/11/2023 2157 Full Code [id number]  Massie Maroon, FNP Inpatient   12/09/2022 1027 12/09/2022 2104 Full Code [id number]  Massie Maroon, FNP Inpatient   10/22/2022 1447 10/26/2022 1954 Full Code [id number]  Massie Maroon, FNP ED   10/01/2022 0935 10/01/2022 2142 Full Code [id number]  Massie Maroon, FNP Inpatient   09/14/2022 0852 09/14/2022 2145 Full Code [id number]  Massie Maroon, FNP Inpatient   08/13/2022 0928 08/13/2022 2124 Full Code [id number]  Massie Maroon, FNP Inpatient   07/01/2022 0924 07/01/2022 2154 Full Code [id number]  Massie Maroon, FNP Inpatient   06/11/2022 1205 06/11/2022 2121 Full Code [id number]  Massie Maroon, FNP Inpatient   05/26/2022 2345 05/31/2022 1752 Full Code [id number]  Darlin Drop, DO ED   05/19/2022 0935 05/19/2022 2136 Full Code [id number]  Massie Maroon, FNP Inpatient   05/04/2022 0833 05/04/2022 2122 Full Code [id number]  Massie Maroon, FNP Inpatient   04/14/2022 1308 04/20/2022 1730 Full Code [id number]  Massie Maroon, FNP Inpatient   03/26/2022 2048 03/28/2022 1921 Full Code [id number]  Synetta Fail, MD ED   11/05/2021 0941 11/05/2021 2021 Full Code [id number]  Massie Maroon, FNP Inpatient   10/29/2021 0004 10/30/2021 2344 Full Code [id number]  Eduard Clos, MD ED   06/10/2021 2311 06/13/2021 1620 Full Code [id number]  Anselm Jungling, DO ED   10/15/2020 1009 10/15/2020 1915 Full Code [id number]  Massie Maroon, FNP Inpatient   01/23/2020 1220 01/23/2020 2235 Full Code [id number]  Massie Maroon, FNP Inpatient   12/05/2016 0916 12/09/2016 1741 Full Code [id number]  [name], MD Inpatient       Home/SNF/Other Home  Chief Complaint Sickle cell anemia with pain (HCC) [D57.00]  Level of Care/Admitting Diagnosis ED Disposition     ED Disposition  Admit   Condition  --   Comment  Hospital Area: [facility] [[id number]] Level of Care: Med-Surg [16] May admit patient to [facility] or [facility] if equivalent level of care is available:: No Covid Evaluation: Asymptomatic - no recent exposure (last 10 days) tes ting not required Diagnosis: Sickle cell anemia with pain [facility]) [[id number]] Admitting Physician: [name] [[id number]] Attending Physician: [name] [[id number]] Bed request comments: 6E Certification:: I certify this patient will  need inpatient services for at least 2 midnights Expected Medical Readiness: 06/17/2023          Medical History Past  Medical History:  Diagnosis Date   Acute cystitis without hematuria 10/21/2019   Blood transfusion without reported diagnosis    Bronchitis    Chickenpox    Depression    Elevated ferritin level 05/2019   Heart murmur    Nausea without vomiting 09/09/2015   Pulmonary hypertension (HCC)    Sickle cell anemia (HCC)    Sickle cell disease, type SS (HCC)    Sickle cell pain crisis (HCC) 12/05/2016   Thrombocytosis 05/2019   Urinary tract infection    Vitamin D deficiency     Allergies Allergies  Allergen Reactions   Latex Rash   Wound Dressing Adhesive Rash    IV  Location/Drains/Wounds Patient Lines/Drains/Airways Status     Active Line/Drains/Airways     Name Placement date Placement time Site Days   Peripheral IV 06/13/23 22 G Posterior;Right Hand 06/13/23  0416  Hand  less than 1            Labs/Imaging Results for orders placed or performed during the hospital encounter of 06/13/23 (from the past 48 hour(s))  Comprehensive metabolic panel     Status: Abnormal   Collection Time: 06/13/23  1:25 AM  Result Value Ref Range   Sodium 134 (L) 135 - 145 mmol/L   Potassium 3.9 3.5 - 5.1 mmol/L   Chloride 104 98 - 111 mmol/L   CO2 22 22 - 32 mmol/L   Glucose, Bld 98 70 - 99 mg/dL    Comment: Glucose reference range applies only to samples taken after fasting for at least 8 hours.   BUN 8 6 - 20 mg/dL   Creatinine, Ser 1.91 (L) 0.44 - 1.00 mg/dL   Calcium 9.3 8.9 - 47.8 mg/dL   Total Protein 7.6 6.5 - 8.1 g/dL   Albumin 5.1 (H) 3.5 - 5.0 g/dL   AST 71 (H) 15 - 41 U/L   ALT 63 (H) 0 - 44 U/L   Alkaline Phosphatase 92 38 - 126 U/L   Total Bilirubin 3.2 (H) <1.2 mg/dL   GFR, Estimated >29 >56 mL/min    Comment: (NOTE) Calculated using the CKD-EPI Creatinine Equation (2021)    Anion gap 8 5 - 15    Comment: Performed at [facility], [address], [place], [place] [address]  CBC with Differential     Status: Abnormal   Collection Time: 06/13/23  1:25 AM  Result Value Ref Range   WBC 12.2 (H) 4.0 - 10.5 K/uL   RBC 1.96 (L) 3.87 - 5.11 MIL/uL   Hemoglobin 7.5 (L) 12.0 - 15.0 g/dL   HCT 65.7 (L) 84.6 - 96.2 %   MCV 109.2 (H) 80.0 - 100.0 fL   MCH 38.3 (H) 26.0 - 34.0 pg   MCHC 35.0 30.0 - 36.0 g/dL   RDW 95.2 (H) 84.1 - 32.4 %   Platelets 479 (H) 150 - 400 K/uL   nRBC 2.4 (H) 0.0 - 0.2 %   Neutrophils Relative % 48 %   Neutro Abs 6.0 1.7 - 7.7 K/uL   Lymphocytes Relative 34 %   Lymphs Abs 4.1 (H) 0.7 - 4.0 K/uL   Monocytes Relative 15 %   Monocytes Absolute 1.8 (H) 0.1 - 1.0 K/uL   Eosinophils Relative 1 %    Eosinophils Absolute 0.1 0.0 - 0.5 K/uL   Basophils Relative 1 %   Basophils Absolute 0.1 0.0 - 0.1 K/uL   Immature Granulocytes 1 %   Abs Immature Granulocytes 0.08 (H) 0.00 -  0.07 K/uL   Polychromasia PRESENT    Sickle Cells PRESENT    Target Cells PRESENT     Comment: Performed at [facility], [address] [place], [place] [address]  Reticulocytes     Status: Abnormal   Collection Time: 06/13/23  1:25 AM  Result Value Ref Range   Retic Ct Pct 26.4 (H) 0.4 - 3.1 %    Comment: REPEATED TO VERIFY CONFIRMED BY MANUAL DILUTION    RBC. 1.90 (L) 3.87 - 5.11 MIL/uL    Comment: RESULTS CONFIRMED BY MANUAL DILUTION   Retic Count, Absolute 501.5 (H) 19.0 - 186.0 K/uL    Comment: REPEATED TO VERIFY CONFIRMED BY MANUAL DILUTION    Immature Retic Fract 35.4 (H) 2.3 - 15.9 %    Comment: REPEATED TO VERIFY CONFIRMED BY MANUAL DILUTION Performed at [facility], [address] [place], [place] [address]   hCG, serum, qualitative     Status: None   Collection Time: 06/13/23  1:25 AM  Result Value Ref Range   Preg, Serum NEGATIVE NEGATIVE    Comment:        THE SENSITIVITY OF THIS METHODOLOGY IS >10 mIU/mL. Performed at [facility], [address], [place], [place] [address]    No results found.  Pending Labs Unresulted Labs (From admission, onward)     Start     Ordered   06/13/23 1304  Urinalysis, Routine w reflex microscopic -Urine, Clean Catch  Once,   R       Question:  Specimen Source  Answer:  Urine, Clean Catch   06/13/23 1303   06/13/23 1304  Rapid urine drug screen (hospital performed)  ONCE - STAT,   STAT        06/13/23 1303   Signed and Held  CBC  Daily,   R      Signed and Held            Vitals/Pain Today's Vitals   06/13/23 0830 06/13/23 0900 06/13/23 1306 06/13/23 1311  BP: 113/66 106/64  104/62  Pulse: 64 65  74  Resp: 16 12  16   Temp: 98.1 F (36.7 C)   98.5 F (36.9 C)  TempSrc: Oral    Oral  SpO2: 95% 94%  94%  Weight:      Height:      PainSc:   8      Isolation Precautions No active isolations  Medications Medications  HYDROmorphone (DILAUDID) injection 2 mg (2 mg Intravenous Given 06/13/23 1307)  ketorolac (TORADOL) 15 MG/ML injection 15 mg (15 mg Intravenous Given 06/13/23 0434)  HYDROmorphone (DILAUDID) injection 2 mg (2 mg Intravenous Given 06/13/23 0438)  HYDROmorphone (DILAUDID) injection 2 mg (2 mg Intravenous Given 06/13/23 0536)  HYDROmorphone (DILAUDID) injection 2 mg (2 mg Intravenous Given 06/13/23 0614)  diphenhydrAMINE (BENADRYL) 12.5 mg in sodium chloride 0.9 % 50 mL IVPB (0 mg Intravenous Stopped 06/13/23 0536)  HYDROmorphone (DILAUDID) injection 2 mg (2 mg Intravenous Given 06/13/23 0843)    Mobility walks

## 2023-06-13 NOTE — ED Provider Notes (Signed)
[place] EMERGENCY DEPARTMENT AT [facility] Provider Note   CSN: [id number] Arrival date & time: 06/13/23  0035     History  Chief Complaint  Patient presents with   Sickle Cell Pain Crisis    Sandra Mckay is a 27 y.o. female.   Sickle Cell Pain Crisis Patient presents for diffuse pain.  Medical history includes sickle cell anemia, anxiety, depression, polysubstance abuse.  She has had multiple admissions over the past month for sickle cell pain crises.  Most recently, she was in the hospital 4 days ago.  Pain mildly improved at the time and she has been at home trying to get through this.  Pain has been worsening since she left the hospital.  At home, she takes oxycodone and Xtampza.  Last dose of pain medication was 5 PM.  Current pain is in lower back, hips, legs.  It is consistent with the pain that she has been experiencing over the past week.  She denies any fever or chest pain.     Home Medications Prior to Admission medications   Medication Sig Start Date End Date Taking? Authorizing Provider  XTAMPZA ER 27 MG C12A Take 1 capsule by mouth 2 (two) times daily. 05/17/23  Yes [provider]      Allergies    Latex and Wound dressing adhesive    Review of Systems   Review of Systems  Musculoskeletal:  Positive for arthralgias and myalgias.  All other systems reviewed and are negative.   Physical Exam Updated Vital Signs BP (!) 112/57 (BP Location: Right Arm)   Pulse 73   Temp 98.8 F (37.1 C) (Oral)   Resp 19   Ht 5\' 2"  (1.575 m)   Wt 59 kg   LMP 06/06/2023   SpO2 98%   BMI 23.78 kg/m  Physical Exam Vitals and nursing note reviewed.  Constitutional:      General: She is not in acute distress.    Appearance: Normal appearance. She is well-developed. She is not ill-appearing, toxic-appearing or diaphoretic.  HENT:     Head: Normocephalic and atraumatic.     Right Ear: External ear normal.     Left Ear: External ear normal.      Nose: Nose normal.     Mouth/Throat:     Mouth: Mucous membranes are moist.  Eyes:     Extraocular Movements: Extraocular movements intact.     Conjunctiva/sclera: Conjunctivae normal.  Cardiovascular:     Rate and Rhythm: Normal rate and regular rhythm.  Pulmonary:     Effort: Pulmonary effort is normal. No respiratory distress.     Breath sounds: Normal breath sounds. No wheezing or rales.  Chest:     Chest wall: No tenderness.  Abdominal:     General: There is no distension.     Palpations: Abdomen is soft.     Tenderness: There is no abdominal tenderness.  Musculoskeletal:        General: No swelling. Normal range of motion.     Cervical back: Normal range of motion and neck supple.  Skin:    General: Skin is warm and dry.     Coloration: Skin is not jaundiced or pale.  Neurological:     General: No focal deficit present.     Mental Status: She is alert and oriented to person, place, and time.  Psychiatric:        Mood and Affect: Mood normal.        Behavior: Behavior  normal.     ED Results / Procedures / Treatments   Labs (all labs ordered are listed, but only abnormal results are displayed) Labs Reviewed  COMPREHENSIVE METABOLIC PANEL - Abnormal; Notable for the following components:      Result Value   Sodium 134 (*)    Creatinine, Ser 0.35 (*)    Albumin 5.1 (*)    AST 71 (*)    ALT 63 (*)    Total Bilirubin 3.2 (*)    All other components within normal limits  CBC WITH DIFFERENTIAL/PLATELET - Abnormal; Notable for the following components:   WBC 12.2 (*)    RBC 1.96 (*)    Hemoglobin 7.5 (*)    HCT 21.4 (*)    MCV 109.2 (*)    MCH 38.3 (*)    RDW 22.5 (*)    Platelets 479 (*)    nRBC 2.4 (*)    Lymphs Abs 4.1 (*)    Monocytes Absolute 1.8 (*)    Abs Immature Granulocytes 0.08 (*)    All other components within normal limits  RETICULOCYTES - Abnormal; Notable for the following components:   Retic Ct Pct 26.4 (*)    RBC. 1.90 (*)    Retic Count,  Absolute 501.5 (*)    Immature Retic Fract 35.4 (*)    All other components within normal limits  HCG, SERUM, QUALITATIVE    EKG None  Radiology No results found.  Procedures Procedures    Medications Ordered in ED Medications  ketorolac (TORADOL) 15 MG/ML injection 15 mg (15 mg Intravenous Given 06/13/23 0434)  HYDROmorphone (DILAUDID) injection 2 mg (2 mg Intravenous Given 06/13/23 0438)  HYDROmorphone (DILAUDID) injection 2 mg (2 mg Intravenous Given 06/13/23 0536)  HYDROmorphone (DILAUDID) injection 2 mg (2 mg Intravenous Given 06/13/23 0614)  diphenhydrAMINE (BENADRYL) 12.5 mg in sodium chloride 0.9 % 50 mL IVPB (0 mg Intravenous Stopped 06/13/23 0536)    ED Course/ Medical Decision Making/ A&P                                 Medical Decision Making Amount and/or Complexity of Data Reviewed Labs: ordered.  Risk Prescription drug management. Decision regarding hospitalization.   This patient presents to the ED for concern of diffuse pain, this involves an extensive number of treatment options, and is a complaint that carries with it a high risk of complications and morbidity.  The differential diagnosis includes chronic pain, opiate withdrawal, sickle cell pain crisis   Co morbidities that complicate the patient evaluation  sickle cell anemia, anxiety, depression, polysubstance abuse   Additional history obtained:  Additional history obtained from N/A External records from outside source obtained and reviewed including EMR   Lab Tests:  I Ordered, and personally interpreted labs.  The pertinent results include: Baseline anemia, mild leukocytosis, baseline bilirubin, mild increase in transaminases.   Cardiac Monitoring: / EKG:  The patient was maintained on a cardiac monitor.  I personally viewed and interpreted the cardiac monitored which showed an underlying rhythm of: Sinus rhythm   Problem List / ED Course / Critical interventions / Medication  management  Patient presenting for diffuse pain, primarily lower back, hips, and legs.  It is consistent with the pain she has been experiencing over the past week.  She was in the day hospital for treatment of this 4 days ago.  Pain has been persistent.  Vital signs on arrival in the  ED are normal.  Workup was initiated prior to patient being bedded in the ED.  Laboratory workup is reassuring.  Patient is well-appearing on exam.  Current breathing is unlabored.  Lungs are clear to auscultation.  Dilaudid and Toradol ordered for analgesia.  Following multiple doses of narcotic pain medication, patient continued to endorse 6/10 severity pain.  I spoke with Dr. Hyman Hopes, who will admit the patient.. I ordered medication including Toradol and Dilaudid for analgesia Reevaluation of the patient after these medicines showed that the patient improved I have reviewed the patients home medicines and have made adjustments as needed   Social Determinants of Health:  Frequent hospital admissions        Final Clinical Impression(s) / ED Diagnoses Final diagnoses:  Sickle cell pain crisis [facility])    Rx / DC Orders ED Discharge Orders     None         Gloris Manchester, MD 06/13/23 [phone]

## 2023-06-13 NOTE — Progress Notes (Signed)
Pt admitted for pain crisis. A&OX4. PCA pump set up with  .5 bolus/10 minute lockouts and 3 dose limit. NO skin issues. 1/4NS at 125. Pt educated on PCA pump use. Bed in lowest position, wheels locked 2/4 rails up call bell near by. No other needs voiced at this time. BP 114/75 (BP Location: Right Arm)   Pulse 60   Temp 98.8 F (37.1 C) (Oral)   Resp 18   Ht 5\' 2"  (1.575 m)   Wt 59 kg   LMP 06/06/2023   SpO2 95%   BMI 23.78 kg/m  Sandra Mckay 06/13/23 6:00 PM

## 2023-06-13 NOTE — ED Provider Notes (Signed)
Patient changed her mind and would like to be admitted again.   Sandra Norfolk, DO 06/13/23 1356

## 2023-06-13 NOTE — Plan of Care (Signed)
  Problem: Education: °Goal: Knowledge of General Education information will improve °Description: Including pain rating scale, medication(s)/side effects and non-pharmacologic comfort measures °Outcome: Progressing °  °Problem: Health Behavior/Discharge Planning: °Goal: Ability to manage health-related needs will improve °Outcome: Progressing °  °Problem: Clinical Measurements: °Goal: Ability to maintain clinical measurements within normal limits will improve °Outcome: Progressing °Goal: Will remain free from infection °Outcome: Progressing °Goal: Diagnostic test results will improve °Outcome: Progressing °  °Problem: Clinical Measurements: °Goal: Will remain free from infection °Outcome: Progressing °  °Problem: Clinical Measurements: °Goal: Diagnostic test results will improve °Outcome: Progressing °  °

## 2023-06-13 NOTE — ED Provider Notes (Deleted)
Patient was up for admission for sickle cell pain crisis but she is feeling better and primary team would like me to discharge her home from the ED which was done.   Sandra Norfolk, DO 06/13/23 1333

## 2023-06-13 NOTE — ED Triage Notes (Signed)
Patient reports she is having cell pain crisis and having pain everywhere. States the pain started yesterday. States she has taken her home medication with no relief.

## 2023-06-13 NOTE — Plan of Care (Signed)
  Problem: Education: Goal: Knowledge of General Education information will improve Description: Including pain rating scale, medication(s)/side effects and non-pharmacologic comfort measures Outcome: Progressing   Problem: Clinical Measurements: Goal: Ability to maintain clinical measurements within normal limits will improve Outcome: Progressing   Problem: Health Behavior: Goal: Postive changes in compliance with treatment and prescription regimens will improve Outcome: Progressing

## 2023-06-13 NOTE — H&P (Signed)
H&P  Patient Demographics:  Sandra Mckay, is a 27 y.o. female  MRN: [id number]   DOB - 1996-01-03  Admit Date - 06/13/2023  Outpatient Primary MD for the patient is French Ana, FNP  Chief Complaint  Patient presents with   Sickle Cell Pain Crisis      HPI:   Sandra Mckay  is a 27 y.o. female with a medical history significant for sickle cell disease, chronic pain syndrome, opiate dependence and tolerance, bipolar depression, history of pulmonary embolism on Xarelto and anemia of chronic disease who presented to the emergency room today with major complaints of generalized body pain but mostly to her lower back and lower extremities that has gotten worse in the past few days.  She has consistently took her home pain medications with no sustained relief.  She was seen at the day hospital few days ago for the same complaints where she got treated and discharged home.  She has no other symptoms other than the pain.  She is rating her pain at 10/10 this morning despite multiple doses of pain medications in the emergency room.  She characterized her pain as constant and throbbing.  She denies any fever, cough, headache, dizziness, chest pain, shortness of breath, nausea, vomiting or diarrhea.  No urinary symptoms.  No sick contacts, recent travels or known exposure to COVID-19.  ED course: Vital signs: BP (!) 112/57 (BP Location: Right Arm)  Pulse 73  Temp 98.8 F (37.1 C) (Oral)  Resp 19  Ht 5\' 2"  (1.575 m)  Wt 59 kg  LMP 06/06/2023  SpO2 98%  BMI 23.78 kg/m. She was found to be in pain but not toxic looking.  CMP showed mild transaminitis with AST at 71 and ALT at 63, slightly elevated total bilirubin at 3.2.  WBC count slightly elevated at 12.2 hemoglobin is at 7.5 which is consistent with her baseline mild thrombocytosis at 479.  She had a robust reticulocytosis.  Patient received multiple doses of IV Dilaudid and other pain management regimen in the emergency room with no  improvement in her pain.  She will be admitted to the hospital for further evaluation and management of sickle cell pain crisis.   Review of systems:  In addition to the HPI above, patient reports No fever or chills No Headache, No changes with vision or hearing No problems swallowing food or liquids No chest pain, cough or shortness of breath No abdominal pain, No nausea or vomiting, Bowel movements are regular No blood in stool or urine No dysuria No new skin rashes or bruises No new weakness, tingling, numbness in any extremity No recent weight gain or loss No polyuria, polydypsia or polyphagia No significant Mental Stressors  A full 10 point Review of Systems was done, except as stated above, all other Review of Systems were negative.  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HCC)    Sickle cell pain crisis (HCC) 12/05/2016   Thrombocytosis 05/2019   Urinary tract infection    Vitamin D deficiency       Past Surgical History:  Procedure Laterality Date   CHOLECYSTECTOMY  2011   EYE SURGERY     Sty removal   IR IMAGING GUIDED PORT INSERTION  03/06/2022   IR REMOVAL TUN ACCESS W/ PORT W/O FL MOD SED  05/29/2022   LABIAL ADHESION LYSIS  1999   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   TONSILLECTOMY  2012     Social History:   Social History   Tobacco Use   Smoking status: Some Days    Current packs/day: 1.00    Types: Cigarettes   Smokeless tobacco: Never   Tobacco comments:    1 pack twice a week  Substance Use Topics   Alcohol use: Yes    Alcohol/week: 0.0 standard drinks of alcohol     Comment: occ     Lives - At home   Family History :   Family History  Problem Relation Age of Onset   Arthritis Other        grandparent   Stroke Other    Hypertension Other    Diabetes Other        grandparent   Cancer - Other Other        Glioblastoma     Home Medications:   Prior to Admission medications   Medication Sig Start Date End Date Taking? Authorizing Provider  oxyCODONE (ROXICODONE) 15 MG immediate release tablet Take 15 mg by mouth every 4 (four) hours as needed for pain.   Yes [provider]  QUEtiapine (SEROQUEL) 100 MG tablet Take 100 mg by mouth at bedtime.   Yes [provider]  rivaroxaban (XARELTO) 20 MG TABS tablet Take 20 mg by mouth daily with supper.   Yes [provider]  Vitamin D, Ergocalciferol, (DRISDOL) 1.25 MG (50000 UNIT) CAPS capsule Take 50,000 Units by mouth every 7 (seven) days.   Yes [provider]  XTAMPZA ER 27 MG C12A Take 1 capsule by mouth 2 (two) times daily. 05/17/23  Yes [provider]     Allergies:   Allergies  Allergen Reactions   Latex Rash   Wound Dressing Adhesive Rash     Physical Exam:   Vitals:   Vitals:   06/13/23 0450 06/13/23 0815  BP:  102/63  Pulse:  66  Resp:  20  Temp: 98.8 F (37.1 C)   SpO2:  90%    Physical Exam: Constitutional: Patient appears well-developed and well-nourished but appears to be in pain.  Mild pain distress. HENT: Normocephalic, atraumatic, External right and left ear normal. Oropharynx is clear and moist.  Eyes: Conjunctivae and EOM are normal. PERRLA, no scleral icterus. Neck: Normal ROM. Neck supple. No JVD. No tracheal deviation. No thyromegaly. CVS: RRR, S1/S2 +, no murmurs, no gallops, no carotid bruit.  Pulmonary: Effort and breath sounds normal, no stridor, rhonchi, wheezes, rales.  Abdominal: Soft. BS +, no distension, tenderness, rebound or guarding.  Musculoskeletal: Normal range of motion. No edema and no  tenderness.  Lymphadenopathy: No lymphadenopathy noted, cervical, inguinal or axillary Neuro: Alert. Normal reflexes, muscle tone coordination. No cranial nerve deficit. Skin: Skin is warm and dry. No rash noted. Not diaphoretic. No erythema. No pallor. Psychiatric: Normal mood and affect. Behavior, judgment, thought content normal.   Data Review:   CBC Recent Labs  Lab 06/09/23 0850 06/13/23 0125  WBC 9.3 12.2*  HGB 7.3* 7.5*  HCT 20.6* 21.4*  PLT 417* 479*  MCV 112.0* 109.2*  MCH 39.7* 38.3*  MCHC  35.4 35.0  RDW 23.1* 22.5*  LYMPHSABS 2.4 4.1*  MONOABS 1.7* 1.8*  EOSABS 0.1 0.1  BASOSABS 0.1 0.1   ------------------------------------------------------------------------------------------------------------------  Chemistries  Recent Labs  Lab 06/09/23 0850 06/13/23 0125  NA 138 134*  K 4.6 3.9  CL 107 104  CO2 24 22  GLUCOSE 89 98  BUN 8 8  CREATININE <0.30* 0.35*  CALCIUM 9.4 9.3  AST 35 71*  ALT 23 63*  ALKPHOS 70 92  BILITOT 2.3* 3.2*   ------------------------------------------------------------------------------------------------------------------ estimated creatinine clearance is 82.1 mL/min (A) (by C-G formula based on SCr of 0.35 mg/dL (L)). ------------------------------------------------------------------------------------------------------------------ No results for input(s): "TSH", "T4TOTAL", "T3FREE", "THYROIDAB" in the last 72 hours.  Invalid input(s): "FREET3"  Coagulation profile No results for input(s): "INR", "PROTIME" in the last 168 hours. ------------------------------------------------------------------------------------------------------------------- No results for input(s): "DDIMER" in the last 72 hours. -------------------------------------------------------------------------------------------------------------------  Cardiac Enzymes No results for input(s): "CKMB", "TROPONINI", "MYOGLOBIN" in the last 168 hours.  Invalid  input(s): "CK" ------------------------------------------------------------------------------------------------------------------ No results found for: "BNP"  ---------------------------------------------------------------------------------------------------------------  Urinalysis    Component Value Date/Time   COLORURINE YELLOW 03/25/2023 1230   APPEARANCEUR CLEAR 03/25/2023 1230   LABSPEC 1.008 03/25/2023 1230   PHURINE 8.0 03/25/2023 1230   GLUCOSEU NEGATIVE 03/25/2023 1230   HGBUR NEGATIVE 03/25/2023 1230   BILIRUBINUR NEGATIVE 03/25/2023 1230   BILIRUBINUR small 11/03/2022 1223   KETONESUR NEGATIVE 03/25/2023 1230   PROTEINUR NEGATIVE 03/25/2023 1230   UROBILINOGEN 1.0 11/03/2022 1223   UROBILINOGEN >=8.0 08/04/2017 1144   NITRITE NEGATIVE 03/25/2023 1230   LEUKOCYTESUR NEGATIVE 03/25/2023 1230    ----------------------------------------------------------------------------------------------------------------   Imaging Results:    No results found.   Assessment & Plan:  Principal Problem:   Sickle cell anemia with pain (HCC) Active Problems:   Anxiety and depression   Chronic pain   History of pulmonary embolism  Hb Sickle Cell Disease with crisis: Admit patient, start IVF 0.45% Saline @ 125 mls/hour, start weight based Dilaudid PCA, start IV Toradol 15 mg Q 6 H, Restart oral home pain medications, Monitor vitals very closely, Re-evaluate pain scale regularly, 2 L of Oxygen by [place], Patient will be re-evaluated for pain in the context of function and relationship to baseline as care progresses. Leukocytosis: Very mild.  Most likely demargination from sickle cell pain crisis.  Will monitor very closely without antibiotics. Anemia of Chronic Disease: Hemoglobin appears stable at baseline today.  There is no clinical indication for blood transfusion at this time.  Will monitor very closely and transfuse as appropriate. Chronic pain Syndrome: Restart and continue oral home  pain medications. History of pulmonary embolism: Patient is on Xarelto.  Will continue. Bipolar disorder: Clinically stable.  She denies any suicidal ideations or thoughts.  Patient is on Seroquel.  Will continue. Mild transaminitis: AST and ALT are mildly elevated at 71 and 63 respectively.  Will monitor very closely.  DVT Prophylaxis: Patient on Xarelto  AM Labs Ordered, also please review Full Orders  Family Communication: Admission, patient's condition and plan of care including tests being ordered have been discussed with the patient who indicate understanding and agree with the plan and Code Status.  Code Status: Full Code  Consults called: None    Admission status: Inpatient    Time spent in minutes : 50 minutes  Jeanann Lewandowsky MD, MHA, CPE, FACP 06/13/2023 at 8:44 AM

## 2023-06-14 DIAGNOSIS — D5700 Hb-SS disease with crisis, unspecified: Secondary | ICD-10-CM | POA: Diagnosis not present

## 2023-06-14 LAB — CBC
HCT: 19.3 % — ABNORMAL LOW (ref 36.0–46.0)
Hemoglobin: 6.8 g/dL — CL (ref 12.0–15.0)
MCH: 38.6 pg — ABNORMAL HIGH (ref 26.0–34.0)
MCHC: 35.2 g/dL (ref 30.0–36.0)
MCV: 109.7 fL — ABNORMAL HIGH (ref 80.0–100.0)
Platelets: 441 10*3/uL — ABNORMAL HIGH (ref 150–400)
RBC: 1.76 MIL/uL — ABNORMAL LOW (ref 3.87–5.11)
RDW: 22.5 % — ABNORMAL HIGH (ref 11.5–15.5)
WBC: 9.4 10*3/uL (ref 4.0–10.5)
nRBC: 1.9 % — ABNORMAL HIGH (ref 0.0–0.2)

## 2023-06-14 LAB — PREPARE RBC (CROSSMATCH)

## 2023-06-14 MED ORDER — DIPHENHYDRAMINE HCL 50 MG/ML IJ SOLN
25.0000 mg | Freq: Once | INTRAMUSCULAR | Status: AC
Start: 2023-06-14 — End: 2023-06-14
  Administered 2023-06-14: 25 mg via INTRAVENOUS
  Filled 2023-06-14: qty 1

## 2023-06-14 MED ORDER — ACETAMINOPHEN 325 MG PO TABS
650.0000 mg | ORAL_TABLET | Freq: Once | ORAL | Status: AC
  Administered 2023-06-14: 650 mg via ORAL
  Filled 2023-06-14: qty 2

## 2023-06-14 NOTE — Progress Notes (Signed)
CRITICAL VALUE STICKER  CRITICAL VALUE:  Hemoglobin  6.8  RECEIVER (on-site recipient of call): Eilleen Kempf  DATE & TIME NOTIFIED: 11/11 @ 7:30  MESSENGER (representative from lab): Ruel Favors   MD NOTIFIED: Julianne Handler   TIME OF NOTIFICATION: 7:30  RESPONSE:  @ [phone] , "orders have been placed"

## 2023-06-14 NOTE — Progress Notes (Signed)
Subjective: Sandra Mckay is a 27 year old female with a medical history significant for sickle cell disease, chronic pain syndrome, opiate dependence and tolerance, and anemia of chronic disease was admitted for sickle cell pain crisis. Today, patient's hemoglobin has decreased below her baseline at 6.8 g/dL.  Patient endorses fatigue.  She also continues to complain of allover body pain especially to low back and lower extremities.  Patient rates her pain as 7/10. She denies any shortness of breath, headache, dizziness, chest pain, urinary symptoms, nausea, vomiting, or diarrhea.  Objective:  Vital signs in last 24 hours:  Vitals:   06/14/23 0022 06/14/23 0234 06/14/23 0359 06/14/23 0606  BP:  103/65  99/65  Pulse:  74  68  Resp: 14 14 12 14   Temp:  98.6 F (37 C)  98.6 F (37 C)  TempSrc:  Oral  Oral  SpO2: 96% 96% 99% 94%  Weight:      Height:        Intake/Output from previous day:   Intake/Output Summary (Last 24 hours) at 06/14/2023 0754 Last data filed at 06/14/2023 0300 Gross per 24 hour  Intake 1859.37 ml  Output --  Net 1859.37 ml    Physical Exam: General: Alert, awake, oriented x3, in no acute distress.  HEENT: [place]/AT PEERL, EOMI Neck: Trachea midline,  no masses, no thyromegal,y no JVD, no carotid bruit OROPHARYNX:  Moist, No exudate/ erythema/lesions.  Heart: Regular rate and rhythm, without murmurs, rubs, gallops, PMI non-displaced, no heaves or thrills on palpation.  Lungs: Clear to auscultation, no wheezing or rhonchi noted. No increased vocal fremitus resonant to percussion  Abdomen: Soft, nontender, nondistended, positive bowel sounds, no masses no hepatosplenomegaly noted..  Neuro: No focal neurological deficits noted cranial nerves II through XII grossly intact. DTRs 2+ bilaterally upper and lower extremities. Strength 5 out of 5 in bilateral upper and lower extremities. Musculoskeletal: No warm swelling or erythema around joints, no spinal  tenderness noted. Psychiatric: Patient alert and oriented x3, good insight and cognition, good recent to remote recall. Lymph node survey: No cervical axillary or inguinal lymphadenopathy noted.  Lab Results:  Basic Metabolic Panel:    Component Value Date/Time   NA 134 (L) 06/13/2023 0125   NA 140 02/02/2023 1219   K 3.9 06/13/2023 0125   CL 104 06/13/2023 0125   CO2 22 06/13/2023 0125   BUN 8 06/13/2023 0125   BUN 7 02/02/2023 1219   CREATININE 0.35 (L) 06/13/2023 0125   CREATININE 0.53 05/31/2017 1145   GLUCOSE 98 06/13/2023 0125   CALCIUM 9.3 06/13/2023 0125   CBC:    Component Value Date/Time   WBC 9.4 06/14/2023 0533   HGB 6.8 (LL) 06/14/2023 0533   HGB 8.4 (L) 02/02/2023 1219   HCT 19.3 (L) 06/14/2023 0533   HCT 24.9 (L) 02/02/2023 1219   PLT 441 (H) 06/14/2023 0533   PLT 492 (H) 02/02/2023 1219   MCV 109.7 (H) 06/14/2023 0533   MCV 106 (H) 02/02/2023 1219   NEUTROABS 6.0 06/13/2023 0125   NEUTROABS 5.9 02/02/2023 1219   LYMPHSABS 4.1 (H) 06/13/2023 0125   LYMPHSABS 3.6 (H) 02/02/2023 1219   MONOABS 1.8 (H) 06/13/2023 0125   EOSABS 0.1 06/13/2023 0125   EOSABS 0.1 02/02/2023 1219   BASOSABS 0.1 06/13/2023 0125   BASOSABS 0.1 02/02/2023 1219    No results found for this or any previous visit (from the past 240 hour(s)).  Studies/Results: No results found.  Medications: Scheduled Meds:  acetaminophen  650 mg Oral  Once   diphenhydrAMINE  25 mg Intravenous Once   HYDROmorphone   Intravenous Q4H   ketorolac  15 mg Intravenous Q6H   oxyCODONE  30 mg Oral Q12H   QUEtiapine  100 mg Oral QHS   rivaroxaban  20 mg Oral Q supper   senna-docusate  1 tablet Oral BID   Continuous Infusions:  sodium chloride 125 mL/hr at 06/13/23 2317   PRN Meds:.diphenhydrAMINE, naloxone **AND** sodium chloride flush, ondansetron (ZOFRAN) IV, oxyCODONE, polyethylene glycol  Consultants: none  Procedures: none  Antibiotics: none  Assessment/Plan: Principal Problem:    Sickle cell anemia with pain (HCC) Active Problems:   Anxiety and depression   Chronic pain   History of pulmonary embolism  Sickle cell disease with pain crisis: Decrease IV fluids to KVO Continue IV Dilaudid PCA Toradol 15 mg IV every 6 hours Monitor vital signs very closely, reevaluate pain scale regularly, and supplemental oxygen as needed  Anemia of chronic disease: Hemoglobin 6.8 g/dL.  Transfuse 1 unit PRBCs today.  Monitor closely.  Labs in AM.  Leukocytosis: Stable.  More than likely secondary to sickle cell crisis.  Monitor closely without antibiotics.  Chronic pain syndrome: Continue home medications  History of pulmonary embolism: Continue Xarelto  Bipolar disorder: Clinically stable.  Patient denies any suicidal homicidal intentions.  Continue Seroquel.  Mild transaminitis: Stable.  Followed closely. Code Status: Full Code Family Communication: N/A Disposition Plan: Not yet ready for discharge  Mak Bonny Rennis Petty  APRN, MSN, FNP-C [facility] [address] [place], [place] [address] [phone]  If 7PM-7AM, please contact night-coverage.  06/14/2023, 7:54 AM  LOS: 1 day

## 2023-06-14 NOTE — TOC CM/SW Note (Signed)
Transition of Care [facility]) - Inpatient Brief Assessment   Patient Details  Name: Sandra Mckay MRN: [id number] Date of Birth: 03/10/1996  Transition of Care [facility]) CM/SW Contact:    Darleene Cleaver, LCSW Phone Number: 06/14/2023, 12:53 PM   Clinical Narrative:  Patient does not have any TOC needs.  Patient does not have any SDOH needs either.  Plan to return back home, TOC signing off.   Transition of Care Asessment: Insurance and Status: Insurance coverage has been reviewed Patient has primary care physician: Yes Home environment has been reviewed: Yes Prior level of function:: Indep Prior/Current Home Services: No current home services Social Determinants of Health Reivew: SDOH reviewed no interventions necessary Readmission risk has been reviewed: Yes Transition of care needs: no transition of care needs at this time

## 2023-06-14 NOTE — Plan of Care (Signed)

## 2023-06-15 DIAGNOSIS — D5700 Hb-SS disease with crisis, unspecified: Secondary | ICD-10-CM | POA: Diagnosis not present

## 2023-06-15 LAB — TYPE AND SCREEN
ABO/RH(D): A POS
Antibody Screen: NEGATIVE
Unit division: 0

## 2023-06-15 LAB — CBC
HCT: 20.2 % — ABNORMAL LOW (ref 36.0–46.0)
Hemoglobin: 7.3 g/dL — ABNORMAL LOW (ref 12.0–15.0)
MCH: 36 pg — ABNORMAL HIGH (ref 26.0–34.0)
MCHC: 36.1 g/dL — ABNORMAL HIGH (ref 30.0–36.0)
MCV: 99.5 fL (ref 80.0–100.0)
Platelets: 394 10*3/uL (ref 150–400)
RBC: 2.03 MIL/uL — ABNORMAL LOW (ref 3.87–5.11)
RDW: 25 % — ABNORMAL HIGH (ref 11.5–15.5)
WBC: 9.4 10*3/uL (ref 4.0–10.5)
nRBC: 1.5 % — ABNORMAL HIGH (ref 0.0–0.2)

## 2023-06-15 LAB — BPAM RBC
Blood Product Expiration Date: 202411252359
ISSUE DATE / TIME: 202411111427
Unit Type and Rh: 6200

## 2023-06-15 NOTE — Plan of Care (Signed)
Discharge instructions given to pt, she verbalized understanding. Pts vitals stable.  Problem: Education: Goal: Knowledge of General Education information will improve Description: Including pain rating scale, medication(s)/side effects and non-pharmacologic comfort measures Outcome: Adequate for Discharge   Problem: Health Behavior/Discharge Planning: Goal: Ability to manage health-related needs will improve Outcome: Adequate for Discharge   Problem: Clinical Measurements: Goal: Ability to maintain clinical measurements within normal limits will improve Outcome: Adequate for Discharge Goal: Will remain free from infection Outcome: Adequate for Discharge Goal: Diagnostic test results will improve Outcome: Adequate for Discharge Goal: Respiratory complications will improve Outcome: Adequate for Discharge Goal: Cardiovascular complication will be avoided Outcome: Adequate for Discharge   Problem: Activity: Goal: Risk for activity intolerance will decrease Outcome: Adequate for Discharge   Problem: Nutrition: Goal: Adequate nutrition will be maintained Outcome: Adequate for Discharge   Problem: Coping: Goal: Level of anxiety will decrease Outcome: Adequate for Discharge   Problem: Elimination: Goal: Will not experience complications related to bowel motility Outcome: Adequate for Discharge Goal: Will not experience complications related to urinary retention Outcome: Adequate for Discharge   Problem: Pain Management: Goal: General experience of comfort will improve Outcome: Adequate for Discharge   Problem: Safety: Goal: Ability to remain free from injury will improve Outcome: Adequate for Discharge   Problem: Skin Integrity: Goal: Risk for impaired skin integrity will decrease Outcome: Adequate for Discharge   Problem: Education: Goal: Knowledge of vaso-occlusive preventative measures will improve Outcome: Adequate for Discharge Goal: Awareness of infection  prevention will improve Outcome: Adequate for Discharge Goal: Awareness of signs and symptoms of anemia will improve Outcome: Adequate for Discharge Goal: Long-term complications will improve Outcome: Adequate for Discharge   Problem: Self-Care: Goal: Ability to incorporate actions that prevent/reduce pain crisis will improve Outcome: Adequate for Discharge   Problem: Bowel/Gastric: Goal: Gut motility will be maintained Outcome: Adequate for Discharge   Problem: Tissue Perfusion: Goal: Complications related to inadequate tissue perfusion will be avoided or minimized Outcome: Adequate for Discharge   Problem: Respiratory: Goal: Pulmonary complications will be avoided or minimized Outcome: Adequate for Discharge Goal: Acute Chest Syndrome will be identified early to prevent complications Outcome: Adequate for Discharge   Problem: Fluid Volume: Goal: Ability to maintain a balanced intake and output will improve Outcome: Adequate for Discharge   Problem: Sensory: Goal: Pain level will decrease with appropriate interventions Outcome: Adequate for Discharge   Problem: Health Behavior: Goal: Postive changes in compliance with treatment and prescription regimens will improve Outcome: Adequate for Discharge

## 2023-06-15 NOTE — Discharge Summary (Signed)
Physician Discharge Summary  Sandra Mckay YNW:[id number] DOB: [birth year] DOA: 06/13/2023  PCP: French Ana, FNP  Admit date: 06/13/2023  Discharge date: 06/15/2023  Discharge Diagnoses:  Principal Problem:   Sickle cell anemia with pain (HCC) Active Problems:   Anxiety and depression   Chronic pain   History of pulmonary embolism   Discharge Condition: Stable  Disposition:  Pt is discharged home in good condition and is to follow up with French Ana, FNP this week to have labs evaluated. Sandra Mckay is instructed to increase activity slowly and balance with rest for the next few days, and use prescribed medication to complete treatment of pain  Diet: Regular Wt Readings from Last 3 Encounters:  06/13/23 59 kg  02/02/23 58.5 kg  01/13/23 59 kg    History of present illness:  Sandra Mckay is a 27 year old female with a medical history significant for sickle cell disease, chronic pain syndrome, opiate dependence and tolerance, bipolar depression, history of pulmonary embolism on Xarelto, and anemia of chronic disease who presented to the emergency room today with major complaints of generalized body pain, but mostly to her lower back and lower extremities that has gotten worse in the past 2 days.  She is consistently took her home pain medications with no sustained relief.  She was seen at the day hospital a few days ago for the same complaints where she got treated and discharged home.  She has no other symptoms other than pain.  She is rating her pain at 10/10 this morning despite multiple doses of pain medications in the emergency room.  She characterizes her pain as constant and throbbing.  She denies any fever, cough, headache, dizziness, chest pain, shortness of breath, nausea, vomiting, or diarrhea.  No urinary symptoms.  No sick contacts, recent travel, or known exposure to COVID-19.  ED course: Vital signs: BP 112/57, pulse 73, temperature 98.8  F, respirations 18, oxygen saturation 98% on room air.  She was found to be in pain, but not toxic appearing.  CMP showed mild transaminitis with AST 71 and ALT 63, slightly elevated bilirubin at 3.2.  WBC count slightly elevated at 12.2, hemoglobin is at 7.5, which is consistent with her baseline, mild thrombocytosis at 479,000, she has robust reticulocytosis.  Patient received multiple doses of IV Dilaudid and other pain management regimen in the emergency room with no improvement in her pain.  She will be admitted to the hospital for further evaluation and management of sickle cell pain crisis.  Hospital Course:  Sickle cell disease with pain crisis: Patient was admitted for sickle cell pain crisis and managed appropriately with IVF, IV Dilaudid via PCA and IV Toradol, as well as other adjunct therapies per sickle cell pain management protocols.  IV Dilaudid PCA weaned appropriately and patient transition to her home medications.  She will follow-up with her PCP today for medication management.  Anemia of chronic disease: Patient's hemoglobin decreased to 6.8 g/dL, which is below her baseline.  She was transfused 1 unit PRBC and hemoglobin was 7.3 prior to discharge.  Patient will follow-up with PCP to repeat CBC with differential and CMP within 1 week. Patient will resume all of her home medications.  Patient was therefore discharged home today in a hemodynamically stable condition.   Sandra Mckay will follow-up with PCP within 1 week of this discharge. Sandra Mckay was counseled extensively about nonpharmacologic means of pain management, patient verbalized understanding and was appreciative of  the care received during this admission.   We  discussed the need for good hydration, monitoring of hydration status, avoidance of heat, cold, stress, and infection triggers. We discussed the need to be adherent with taking Hydrea and other home medications. Patient was reminded of the need to seek medical attention  immediately if any symptom of bleeding, anemia, or infection occurs.  Discharge Exam: Vitals:   06/15/23 0740 06/15/23 0933  BP:  112/75  Pulse:  76  Resp: 15 17  Temp:  98.3 F (36.8 C)  SpO2: 95% 90%   Vitals:   06/15/23 0513 06/15/23 0630 06/15/23 0740 06/15/23 0933  BP:  102/65  112/75  Pulse:  90  76  Resp: 16 18 15 17   Temp:  98.7 F (37.1 C)  98.3 F (36.8 C)  TempSrc:  Oral  Oral  SpO2: 93% 91% 95% 90%  Weight:      Height:        General appearance : Awake, alert, not in any distress. Speech Clear. Not toxic looking HEENT: Atraumatic and Normocephalic, pupils equally reactive to light and accomodation Neck: Supple, no JVD. No cervical lymphadenopathy.  Chest: Good air entry bilaterally, no added sounds  CVS: S1 S2 regular, no murmurs.  Abdomen: Bowel sounds present, Non tender and not distended with no gaurding, rigidity or rebound. Extremities: B/L Lower Ext shows no edema, both legs are warm to touch Neurology: Awake alert, and oriented X 3, CN II-XII intact, Non focal Skin: No Rash  Discharge Instructions  Discharge Instructions     Discharge patient   Complete by: As directed    Discharge disposition: 01-Home or Self Care   Discharge patient date: 06/15/2023      Allergies as of 06/15/2023       Reactions   Latex Rash   Wound Dressing Adhesive Rash        Medication List     TAKE these medications    oxyCODONE 15 MG immediate release tablet Commonly known as: ROXICODONE Take 15 mg by mouth every 4 (four) hours as needed for pain.   QUEtiapine 100 MG tablet Commonly known as: SEROQUEL Take 100 mg by mouth at bedtime.   rivaroxaban 20 MG Tabs tablet Commonly known as: XARELTO Take 20 mg by mouth daily with supper.   Vitamin D (Ergocalciferol) 1.25 MG (50000 UNIT) Caps capsule Commonly known as: DRISDOL Take 50,000 Units by mouth every 7 (seven) days.   Xtampza ER 27 MG C12a Generic drug: oxyCODONE ER Take 1 capsule by mouth 2  (two) times daily.        The results of significant diagnostics from this hospitalization (including imaging, microbiology, ancillary and laboratory) are listed below for reference.    Significant Diagnostic Studies: No results found.  Microbiology: No results found for this or any previous visit (from the past 240 hour(s)).   Labs: Basic Metabolic Panel: Recent Labs  Lab 06/09/23 0850 06/13/23 0125  NA 138 134*  K 4.6 3.9  CL 107 104  CO2 24 22  GLUCOSE 89 98  BUN 8 8  CREATININE <0.30* 0.35*  CALCIUM 9.4 9.3   Liver Function Tests: Recent Labs  Lab 06/09/23 0850 06/13/23 0125  AST 35 71*  ALT 23 63*  ALKPHOS 70 92  BILITOT 2.3* 3.2*  PROT 7.6 7.6  ALBUMIN 4.7 5.1*   No results for input(s): "LIPASE", "AMYLASE" in the last 168 hours. No results for input(s): "AMMONIA" in the last 168 hours. CBC: Recent Labs  Lab 06/09/23 0850 06/13/23 0125 06/14/23 0533 06/15/23 0535  WBC 9.3 12.2* 9.4 9.4  NEUTROABS 5.1 6.0  --   --   HGB 7.3* 7.5* 6.8* 7.3*  HCT 20.6* 21.4* 19.3* 20.2*  MCV 112.0* 109.2* 109.7* 99.5  PLT 417* 479* 441* 394   Cardiac Enzymes: No results for input(s): "CKTOTAL", "CKMB", "CKMBINDEX", "TROPONINI" in the last 168 hours. BNP: Invalid input(s): "POCBNP" CBG: No results for input(s): "GLUCAP" in the last 168 hours.  Time coordinating discharge: 30 minutes  Signed:  [name]  APRN, MSN, FNP-C Patient Care [facility] [address] [place], [place] [address] [phone]  [facility] 06/15/2023, 10:08 AM

## 2023-06-17 ENCOUNTER — Telehealth: Payer: Self-pay

## 2023-06-17 NOTE — Transitions of Care (Post Inpatient/ED Visit) (Signed)
06/17/2023  Name: Sandra Mckay MRN: [id number] DOB: 01-16-1996  Today's TOC FU Call Status: Today's TOC FU Call Status:: Successful TOC FU Call Completed TOC FU Call Complete Date: 06/15/23 Patient's Name and Date of Birth confirmed.  Transition Care Management Follow-up Telephone Call Date of Discharge: 06/16/23 Discharge Facility: [facility]) Type of Discharge: Inpatient Admission Primary Inpatient Discharge Diagnosis:: Sickle cell crisis How have you been since you were released from the hospital?: Better (states I am still dragging) Any questions or concerns?: No  Items Reviewed: Did you receive and understand the discharge instructions provided?: Yes Medications obtained,verified, and reconciled?: Yes (Medications Reviewed) Any new allergies since your discharge?: No Dietary orders reviewed?: Yes Type of Diet Ordered:: stay hydrated Do you have support at home?: Yes Name of Support/Comfort Primary Source: family  Medications Reviewed Today: Medications Reviewed Today     Reviewed by Earlie Server, RN (Registered Nurse) on 06/17/23 at 1221  Med List Status: <None>   Medication Order Taking? Sig Documenting Provider Last Dose Status Informant  oxyCODONE (ROXICODONE) 15 MG immediate release tablet [id number] No Take 15 mg by mouth every 4 (four) hours as needed for pain. [provider] 06/12/2023 Active Self, Pharmacy Records  QUEtiapine (SEROQUEL) 100 MG tablet [id number] No Take 100 mg by mouth at bedtime. [provider] 06/12/2023 Active Self, Pharmacy Records  rivaroxaban (XARELTO) 20 MG TABS tablet [id number] No Take 20 mg by mouth daily with supper. [provider] 06/12/2023 Active Self, Pharmacy Records           Med Note Lia Hopping, Ty Hilts Jun 13, 2023  7:42 AM) Pt states she had this yesterday but could not tell me a time or if it was am or pm  Vitamin D, Ergocalciferol, (DRISDOL) 1.25 MG (50000 UNIT) CAPS capsule [id number]  No Take 50,000 Units by mouth every 7 (seven) days. [provider] Past Month Active Self, Pharmacy Records  XTAMPZA ER 27 MG C12A [id number] No Take 1 capsule by mouth 2 (two) times daily. [provider] 06/12/2023 Active Self, Pharmacy Records            Home Care and Equipment/Supplies: Were Home Health Services Ordered?: NA Any new equipment or medical supplies ordered?: NA  Functional Questionnaire: Do you need assistance with bathing/showering or dressing?: No Do you need assistance with meal preparation?: No Do you need assistance with eating?: No Do you have difficulty maintaining continence: No Do you need assistance with getting out of bed/getting out of a chair/moving?: No Do you have difficulty managing or taking your medications?: No  Follow up appointments reviewed: PCP Follow-up appointment confirmed?: Yes Date of PCP follow-up appointment?: 06/15/23 Follow-up Provider: non [facility] Specialist Hospital Follow-up appointment confirmed?: No Reason Specialist Follow-Up Not Confirmed: Patient has Specialist Provider Number and will Call for Appointment Do you need transportation to your follow-up appointment?: No Do you understand care options if your condition(s) worsen?: Yes-patient verbalized understanding  SDOH Interventions Today    Flowsheet Row Most Recent Value  SDOH Interventions   Food Insecurity Interventions Intervention Not Indicated  Transportation Interventions Intervention Not Indicated      Reviewed with patient the reason for the call.  Patient reports that he goes to the sickle cell clinic but PCP is in [facility] and not [facility]  Reviewed with patient the  recommendation to follow up with provide [facility] in 1 week and patient has declined and states this provider is leaving the practice on 07/08/2023  and she has already followed up with PCP.  Offered additional services and patient declined. Patient in a hurry to get off the  phone.   Lonia Chimera, RN, BSN, CEN [employer]- Transition of Care Team.  [facility] [phone]

## 2023-06-30 ENCOUNTER — Telehealth (HOSPITAL_COMMUNITY): Payer: Self-pay | Admitting: *Deleted

## 2023-06-30 ENCOUNTER — Non-Acute Institutional Stay (HOSPITAL_COMMUNITY)
Admission: AD | Admit: 2023-06-30 | Discharge: 2023-06-30 | Disposition: A | Discharge status: Home / Self Care | Payer: Medicare Other | Source: Ambulatory Visit | Attending: Internal Medicine | Admitting: Internal Medicine

## 2023-06-30 ENCOUNTER — Other Ambulatory Visit: Payer: Self-pay | Admitting: Internal Medicine

## 2023-06-30 DIAGNOSIS — D5700 Hb-SS disease with crisis, unspecified: Secondary | ICD-10-CM | POA: Diagnosis present

## 2023-06-30 DIAGNOSIS — F319 Bipolar disorder, unspecified: Secondary | ICD-10-CM | POA: Insufficient documentation

## 2023-06-30 DIAGNOSIS — G894 Chronic pain syndrome: Secondary | ICD-10-CM | POA: Diagnosis not present

## 2023-06-30 DIAGNOSIS — F1120 Opioid dependence, uncomplicated: Secondary | ICD-10-CM | POA: Insufficient documentation

## 2023-06-30 LAB — CBC
HCT: 21.4 % — ABNORMAL LOW (ref 36.0–46.0)
Hemoglobin: 7.8 g/dL — ABNORMAL LOW (ref 12.0–15.0)
MCH: 36.6 pg — ABNORMAL HIGH (ref 26.0–34.0)
MCHC: 36.4 g/dL — ABNORMAL HIGH (ref 30.0–36.0)
MCV: 100.5 fL — ABNORMAL HIGH (ref 80.0–100.0)
Platelets: 480 10*3/uL — ABNORMAL HIGH (ref 150–400)
RBC: 2.13 MIL/uL — ABNORMAL LOW (ref 3.87–5.11)
RDW: 23.9 % — ABNORMAL HIGH (ref 11.5–15.5)
WBC: 11.9 10*3/uL — ABNORMAL HIGH (ref 4.0–10.5)
nRBC: 1.2 % — ABNORMAL HIGH (ref 0.0–0.2)

## 2023-06-30 LAB — COMPREHENSIVE METABOLIC PANEL
ALT: 23 U/L (ref 0–44)
AST: 35 U/L (ref 15–41)
Albumin: 5 g/dL (ref 3.5–5.0)
Alkaline Phosphatase: 72 U/L (ref 38–126)
Anion gap: 9 (ref 5–15)
BUN: 6 mg/dL (ref 6–20)
CO2: 22 mmol/L (ref 22–32)
Calcium: 9.6 mg/dL (ref 8.9–10.3)
Chloride: 105 mmol/L (ref 98–111)
Creatinine, Ser: 0.46 mg/dL (ref 0.44–1.00)
GFR, Estimated: >60 mL/min (ref >60)
Glucose, Bld: 93 mg/dL (ref 70–99)
Potassium: 4.2 mmol/L (ref 3.5–5.1)
Sodium: 136 mmol/L (ref 135–145)
Total Bilirubin: 2.2 mg/dL — ABNORMAL HIGH (ref <1.2)
Total Protein: 8.2 g/dL — ABNORMAL HIGH (ref 6.5–8.1)

## 2023-06-30 LAB — RETICULOCYTES
Immature Retic Fract: 32.9 % — ABNORMAL HIGH (ref 2.3–15.9)
RBC.: 2.13 MIL/uL — ABNORMAL LOW (ref 3.87–5.11)
Retic Count, Absolute: 495.5 10*3/uL — ABNORMAL HIGH (ref 19.0–186.0)
Retic Ct Pct: 22.5 % — ABNORMAL HIGH (ref 0.4–3.1)

## 2023-06-30 MED ORDER — NALOXONE HCL 0.4 MG/ML IJ SOLN: 0.4000 mg | INTRAMUSCULAR | Status: DC | PRN

## 2023-06-30 MED ORDER — ONDANSETRON HCL 4 MG/2ML IJ SOLN
4.0000 mg | Freq: Four times a day (QID) | INTRAMUSCULAR | Status: DC | PRN
  Administered 2023-06-30: 4 mg via INTRAVENOUS
  Filled 2023-06-30: qty 2

## 2023-06-30 MED ORDER — KETOROLAC TROMETHAMINE 30 MG/ML IJ SOLN
15.0000 mg | Freq: Once | INTRAMUSCULAR | Status: AC
  Administered 2023-06-30: 15 mg via INTRAVENOUS
  Filled 2023-06-30: qty 1

## 2023-06-30 MED ORDER — DIPHENHYDRAMINE HCL 25 MG PO CAPS
25.0000 mg | ORAL_CAPSULE | ORAL | Status: DC | PRN
  Administered 2023-06-30: 25 mg via ORAL
  Filled 2023-06-30: qty 1

## 2023-06-30 MED ORDER — SODIUM CHLORIDE 0.9% FLUSH: 9.0000 mL | INTRAVENOUS | Status: DC | PRN

## 2023-06-30 MED ORDER — ACETAMINOPHEN 500 MG PO TABS
1000.0000 mg | ORAL_TABLET | Freq: Once | ORAL | Status: AC
  Administered 2023-06-30: 1000 mg via ORAL
  Filled 2023-06-30: qty 2

## 2023-06-30 MED ORDER — HYDROMORPHONE 1 MG/ML IV SOLN
INTRAVENOUS | Status: DC
  Administered 2023-06-30: 12 mg via INTRAVENOUS
  Administered 2023-06-30: 30 mg via INTRAVENOUS
  Filled 2023-06-30: qty 30

## 2023-06-30 NOTE — Progress Notes (Signed)
Pt admitted to the day hospital by [place] Hollis FNP,  for treatment of sickle cell pain crisis. Pt reported generalized pain rated a 9/10. PIV established by IV team. Pt given PO Benadryl and Tylenol, IV toradol and zofran, placed on Dilaudid PCA 0.5/10/3, and hydrated with IV fluids at [facility]. At discharge patient reported pain a 5/10. Pt declined AVS.  Pt alert , oriented and ambulatory at discharge.

## 2023-06-30 NOTE — Telephone Encounter (Signed)
Patient called requesting to come to the day hospital for sickle cell pain. Patient reports back and bilateral leg pain rated 9/10. Reports taking Oxycodone 15 mg at 2:00 am. COVID-19 screening done and patient denies all symptoms and exposures. Denies fever, chest pain, nausea, vomiting, diarrhea and abdominal pain. Patient will need a taxi to transport her to the appointment but has a ride at discharge.  [place], FNP notified. Patient can come to the day hospital for pain management. Patient advised and expresses an understanding.

## 2023-06-30 NOTE — H&P (Signed)
[facility] History and Physical   Date: 06/30/2023  Patient name: Sandra Mckay Medical record number: [id number] Date of birth: 1996-06-12 Age: 27 y.o. Gender: female PCP: French Ana, FNP  Attending physician: [name], MD  Chief Complaint: Sickle cell pain   History of present illness:  Sandra Mckay is a 27 year old female with a medical history significant for sickle cell disease, chronic pain syndrome, opiate dependence and tolerance, bipolar depression, and history of anemia of chronic disease that presents with complaints of generalized pain that is consistent with her typical pain crisis.  Patient states that pain intensity has been elevated over the past several days and unrelieved by her home medications.  Patient has not identified any inciting factors concerning crisis.  She rates pain as 9/10, constant, and aching.  She last had oxycodone this a.m. without sustained relief.  Patient denies fever, chills, chest pain, or shortness of breath.  No urinary symptoms, nausea, vomiting, or diarrhea.  No sick contacts or recent travel. Meds: Medications Prior to Admission  Medication Sig Dispense Refill Last Dose   oxyCODONE (ROXICODONE) 15 MG immediate release tablet Take 15 mg by mouth every 4 (four) hours as needed for pain.   06/30/2023   QUEtiapine (SEROQUEL) 100 MG tablet Take 100 mg by mouth at bedtime.   06/29/2023   rivaroxaban (XARELTO) 20 MG TABS tablet Take 20 mg by mouth daily with supper.   06/29/2023   XTAMPZA ER 27 MG C12A Take 1 capsule by mouth 2 (two) times daily.   06/29/2023   Vitamin D, Ergocalciferol, (DRISDOL) 1.25 MG (50000 UNIT) CAPS capsule Take 50,000 Units by mouth every 7 (seven) days.   More than a month    Allergies: Latex and Wound dressing adhesive Past Medical History:  Diagnosis Date   Acute cystitis without hematuria 10/21/2019   Blood transfusion without reported diagnosis    Bronchitis    Chickenpox     Depression    Elevated ferritin level 05/2019   Heart murmur    Nausea without vomiting 09/09/2015   Pulmonary hypertension (HCC)    Sickle cell anemia (HCC)    Sickle cell disease, type SS (HCC)    Sickle cell pain crisis (HCC) 12/05/2016   Thrombocytosis 05/2019   Urinary tract infection    Vitamin D deficiency    Past Surgical History:  Procedure Laterality Date   CHOLECYSTECTOMY  2011   EYE SURGERY     Sty removal   IR IMAGING GUIDED PORT INSERTION  03/06/2022   IR REMOVAL TUN ACCESS W/ PORT W/O FL MOD SED  05/29/2022   LABIAL ADHESION LYSIS  1999   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   TONSILLECTOMY  2012   Family History  Problem Relation Age of Onset   Arthritis Other        grandparent   Stroke Other    Hypertension Other    Diabetes Other        grandparent   Cancer - Other Other        Glioblastoma   Social History   Socioeconomic History   Marital status: Single    Spouse name: Not on file   Number of children: Not on file   Years of education: Not on file   Highest education level: Not on file  Occupational History   Not on file  Tobacco Use   Smoking status: Some Days    Current packs/day:  1.00    Types: Cigarettes   Smokeless tobacco: Never   Tobacco comments:    1 pack twice a week  Vaping Use   Vaping status: Never Used  Substance and Sexual Activity   Alcohol use: Yes    Alcohol/week: 0.0 standard drinks of alcohol    Comment: occ   Drug use: Yes    Types: Marijuana    Comment: sometimes   Sexual activity: Yes  Other Topics Concern   Not on file  Social History Narrative   Lives with mom in a one story home.  Education: 2 years of college.    Social Determinants of Health   Financial Resource Strain: Low Risk  (01/28/2022)   Received from [facility], [facility]   Overall Financial Resource Strain (CARDIA)    Difficulty of Paying Living Expenses: Not very hard  Food Insecurity: No Food  Insecurity (06/17/2023)   Hunger Vital Sign    Worried About Running Out of Food in the Last Year: Never true    Ran Out of Food in the Last Year: Never true  Transportation Needs: No Transportation Needs (06/17/2023)   PRAPARE - Administrator, Civil Service (Medical): No    Lack of Transportation (Non-Medical): No  Physical Activity: Not on file  Stress: No Stress Concern Present (01/28/2022)   Received from [facility] - Occupational Stress Questionnaire    Feeling of Stress : Only a little  Social Connections: Unknown (11/30/2021)   Received from [facility]   Social Network    Social Network: Not on file  Intimate Partner Violence: Not At Risk (06/13/2023)   Humiliation, Afraid, Rape, and Kick questionnaire    Fear of Current or Ex-Partner: No    Emotionally Abused: No    Physically Abused: No    Sexually Abused: No   Review of Systems  Constitutional:  Negative for chills and fever.  HENT: Negative.    Eyes: Negative.   Respiratory: Negative.    Cardiovascular: Negative.   Gastrointestinal: Negative.   Genitourinary: Negative.   Musculoskeletal:  Positive for back pain and joint pain.  Skin: Negative.   Neurological: Negative.   Endo/Heme/Allergies: Negative.   Psychiatric/Behavioral: Negative.      Physical Exam: Blood pressure (!) 110/58, pulse 74, temperature 99 F (37.2 C), temperature source Temporal, resp. rate 17, last menstrual period 06/06/2023, SpO2 95%. Physical Exam Constitutional:      Appearance: Normal appearance.  Eyes:     Pupils: Pupils are equal, round, and reactive to light.  Cardiovascular:     Rate and Rhythm: Normal rate and regular rhythm.     Pulses: Normal pulses.  Pulmonary:     Effort: Pulmonary effort is normal.  Abdominal:     General: Bowel sounds are normal.  Musculoskeletal:        General: Normal range of motion.  Skin:    General: Skin  is warm.  Neurological:     General: No focal deficit present.  Psychiatric:        Mood and Affect: Mood normal.        Thought Content: Thought content normal.        Judgment: Judgment normal.      Lab results: Results for orders placed or performed during the hospital encounter of 06/30/23 (from the past 24 hour(s))  CBC     Status: Abnormal   Collection Time: 06/30/23 10:45 AM  Result Value Ref Range   WBC 11.9 (H) 4.0 - 10.5 K/uL   RBC 2.13 (L) 3.87 - 5.11 MIL/uL   Hemoglobin 7.8 (L) 12.0 - 15.0 g/dL   HCT 41.3 (L) 24.4 - 01.0 %   MCV 100.5 (H) 80.0 - 100.0 fL   MCH 36.6 (H) 26.0 - 34.0 pg   MCHC 36.4 (H) 30.0 - 36.0 g/dL   RDW 27.2 (H) 53.6 - 64.4 %   Platelets 480 (H) 150 - 400 K/uL   nRBC 1.2 (H) 0.0 - 0.2 %  Reticulocytes     Status: Abnormal   Collection Time: 06/30/23 10:45 AM  Result Value Ref Range   Retic Ct Pct 22.5 (H) 0.4 - 3.1 %   RBC. 2.13 (L) 3.87 - 5.11 MIL/uL   Retic Count, Absolute 495.5 (H) 19.0 - 186.0 K/uL   Immature Retic Fract 32.9 (H) 2.3 - 15.9 %  Comprehensive metabolic panel     Status: Abnormal   Collection Time: 06/30/23 10:45 AM  Result Value Ref Range   Sodium 136 135 - 145 mmol/L   Potassium 4.2 3.5 - 5.1 mmol/L   Chloride 105 98 - 111 mmol/L   CO2 22 22 - 32 mmol/L   Glucose, Bld 93 70 - 99 mg/dL   BUN 6 6 - 20 mg/dL   Creatinine, Ser 0.34 0.44 - 1.00 mg/dL   Calcium 9.6 8.9 - 74.2 mg/dL   Total Protein 8.2 (H) 6.5 - 8.1 g/dL   Albumin 5.0 3.5 - 5.0 g/dL   AST 35 15 - 41 U/L   ALT 23 0 - 44 U/L   Alkaline Phosphatase 72 38 - 126 U/L   Total Bilirubin 2.2 (H) <1.2 mg/dL   GFR, Estimated >59 >56 mL/min   Anion gap 9 5 - 15    Imaging results:  No results found.   Assessment & Plan: Patient admitted to sickle cell day infusion center for management of pain crisis.  Patient is opiate naive Initiate IV dilaudid PCA.  Toradol 15 mg IV times one dose Tylenol 1000 mg by mouth times one dose Review CBC with differential,  complete metabolic panel, and reticulocytes as results become available.  Pain intensity will be reevaluated in context of functioning and relationship to baseline as care progresses If pain intensity remains elevated and/or sudden change in hemodynamic stability transition to inpatient services for higher level of care.      [name]  APRN, MSN, FNP-C Patient Care [facility] [address] [place], [place] [address] [phone]  06/30/2023, 1:59 PM

## 2023-07-01 NOTE — Discharge Summary (Addendum)
[facility] Discharge Summary   Patient ID: [name] MRN: [id number] DOB/AGE: 1995-08-30 27 y.o.  Admit date: 06/30/2023 Discharge date: 06/30/2023  Primary Care Physician:  French Ana, FNP  Admission Diagnoses:  Principal Problem:   Sickle cell pain crisis [facility])   Discharge Medications:  Allergies as of 06/30/2023       Reactions   Latex Rash   Wound Dressing Adhesive Rash        Medication List     ASK your doctor about these medications    oxyCODONE 15 MG immediate release tablet Commonly known as: ROXICODONE Take 15 mg by mouth every 4 (four) hours as needed for pain.   QUEtiapine 100 MG tablet Commonly known as: SEROQUEL Take 100 mg by mouth at bedtime.   Vitamin D (Ergocalciferol) 1.25 MG (50000 UNIT) Caps capsule Commonly known as: DRISDOL Take 50,000 Units by mouth every 7 (seven) days.   Xtampza ER 27 MG C12a Generic drug: oxyCODONE ER Take 1 capsule by mouth 2 (two) times daily.         Consults:  None  Significant Diagnostic Studies:  No results found.  History of present illness:  Sandra Mckay is a 27 year old female with a medical history significant for sickle cell disease, chronic pain syndrome, opiate dependence and tolerance, bipolar depression, and history of anemia of chronic disease that presents with complaints of generalized pain that is consistent with her typical pain crisis.  Patient states that pain intensity has been elevated over the past several days and unrelieved by her home medications.  Patient has not identified any inciting factors concerning crisis.  She rates pain as 9/10, constant, and aching.  She last had oxycodone this a.m. without sustained relief.  Patient denies fever, chills, chest pain, or shortness of breath.  No urinary symptoms, nausea, vomiting, or diarrhea.  No sick contacts or recent travel. [facility] Course: Patient admitted to sickle cell day infusion  clinic for management of her pain crisis.  Reviewed all laboratory values, largely consistent with her baseline.  Pain managed with IV Dilaudid PCA, IV Toradol, and Tylenol. Pain intensity decreased throughout admission and patient is requesting discharge home. She is alert, oriented, and ambulating without assistance.  Patient will discharge home in a hemodynamically stable condition.  Discharge instructions: Resume all home medications.   Follow up with PCP as previously  scheduled.   Discussed the importance of drinking 64 ounces of water daily, dehydration of red blood cells may lead further sickling.   Avoid all stressors that precipitate sickle cell pain crisis.     The patient was given clear instructions to go to ER or return to medical center if symptoms do not improve, worsen or new problems develop.    Discharge instructions:  Resume all home medications.   Follow up with PCP as previously  scheduled.   Discussed the importance of drinking 64 ounces of water daily, dehydration of red blood cells may lead further sickling.   Avoid all stressors that precipitate sickle cell pain crisis.     The patient was given clear instructions to go to ER or return to medical center if symptoms do not improve, worsen or new problems develop.   Physical Exam at Discharge:  BP 114/69 (BP Location: Left Arm)   Pulse 73   Temp 98.6 F (37 C) (Temporal)   Resp 12   LMP 06/06/2023 (Exact Date)   SpO2 97%   Physical Exam Constitutional:  Appearance: Normal appearance.  Cardiovascular:     Rate and Rhythm: Normal rate and regular rhythm.     Pulses: Normal pulses.  Pulmonary:     Effort: Pulmonary effort is normal.     Breath sounds: Normal breath sounds.  Abdominal:     General: Bowel sounds are normal.  Skin:    General: Skin is warm.  Neurological:     General: No focal deficit present.     Mental Status: Mental status is at baseline.  Psychiatric:        Mood and  Affect: Mood normal.        Thought Content: Thought content normal.        Judgment: Judgment normal.      Disposition at Discharge: Discharge disposition: 01-Home or Self Care       Discharge Orders:   Condition at Discharge:   Stable  Time spent on Discharge:  Greater than 30 minutes.  Signed: [name]  APRN, MSN, FNP-C Patient Care [facility] [address] [place], [place] [address] [phone]  07/01/2023, 7:20 AM  Evaluation and management procedures were performed by the Advanced Practitioner under my supervision and collaboration. I have reviewed the Advanced Practitioner's note and chart, and I agree with the management and plan.   Jeanann Lewandowsky, MD, MHA, CPE, Sherle Poe [facility] Patient [facility] [place], [place] [phone] 07/02/2023, 3:16 PM

## 2023-07-09 ENCOUNTER — Telehealth (HOSPITAL_COMMUNITY): Payer: Self-pay

## 2023-07-09 ENCOUNTER — Non-Acute Institutional Stay (HOSPITAL_COMMUNITY)
Admission: AD | Admit: 2023-07-09 | Discharge: 2023-07-09 | Disposition: A | Discharge status: Home / Self Care | Payer: Medicare Other | Attending: Internal Medicine | Admitting: Internal Medicine

## 2023-07-09 DIAGNOSIS — G894 Chronic pain syndrome: Secondary | ICD-10-CM | POA: Insufficient documentation

## 2023-07-09 DIAGNOSIS — F17210 Nicotine dependence, cigarettes, uncomplicated: Secondary | ICD-10-CM | POA: Insufficient documentation

## 2023-07-09 DIAGNOSIS — D5700 Hb-SS disease with crisis, unspecified: Secondary | ICD-10-CM | POA: Insufficient documentation

## 2023-07-09 DIAGNOSIS — F1120 Opioid dependence, uncomplicated: Secondary | ICD-10-CM | POA: Diagnosis not present

## 2023-07-09 LAB — CBC WITH DIFFERENTIAL/PLATELET
Abs Immature Granulocytes: 0.21 10*3/uL — ABNORMAL HIGH (ref 0.00–0.07)
Basophils Absolute: 0.1 10*3/uL (ref 0.0–0.1)
Basophils Relative: 1 %
Eosinophils Absolute: 0.1 10*3/uL (ref 0.0–0.5)
Eosinophils Relative: 1 %
HCT: 21.4 % — ABNORMAL LOW (ref 36.0–46.0)
Hemoglobin: 7.7 g/dL — ABNORMAL LOW (ref 12.0–15.0)
Immature Granulocytes: 2 %
Lymphocytes Relative: 32 %
Lymphs Abs: 3.1 10*3/uL (ref 0.7–4.0)
MCH: 37.6 pg — ABNORMAL HIGH (ref 26.0–34.0)
MCHC: 36 g/dL (ref 30.0–36.0)
MCV: 104.4 fL — ABNORMAL HIGH (ref 80.0–100.0)
Monocytes Absolute: 1.5 10*3/uL — ABNORMAL HIGH (ref 0.1–1.0)
Monocytes Relative: 15 %
Neutro Abs: 4.6 10*3/uL (ref 1.7–7.7)
Neutrophils Relative %: 49 %
Platelets: 476 10*3/uL — ABNORMAL HIGH (ref 150–400)
RBC: 2.05 MIL/uL — ABNORMAL LOW (ref 3.87–5.11)
RDW: 25.9 % — ABNORMAL HIGH (ref 11.5–15.5)
WBC: 9.5 10*3/uL (ref 4.0–10.5)
nRBC: 2 % — ABNORMAL HIGH (ref 0.0–0.2)

## 2023-07-09 LAB — COMPREHENSIVE METABOLIC PANEL
ALT: 57 U/L — ABNORMAL HIGH (ref 0–44)
AST: 70 U/L — ABNORMAL HIGH (ref 15–41)
Albumin: 4.8 g/dL (ref 3.5–5.0)
Alkaline Phosphatase: 82 U/L (ref 38–126)
Anion gap: 8 (ref 5–15)
BUN: 8 mg/dL (ref 6–20)
CO2: 23 mmol/L (ref 22–32)
Calcium: 9.3 mg/dL (ref 8.9–10.3)
Chloride: 105 mmol/L (ref 98–111)
Creatinine, Ser: 0.34 mg/dL — ABNORMAL LOW (ref 0.44–1.00)
GFR, Estimated: >60 mL/min (ref >60)
Glucose, Bld: 83 mg/dL (ref 70–99)
Potassium: 4.4 mmol/L (ref 3.5–5.1)
Sodium: 136 mmol/L (ref 135–145)
Total Bilirubin: 2.5 mg/dL — ABNORMAL HIGH (ref <1.2)
Total Protein: 7.8 g/dL (ref 6.5–8.1)

## 2023-07-09 LAB — RETICULOCYTES
Immature Retic Fract: 33.3 % — ABNORMAL HIGH (ref 2.3–15.9)
RBC.: 2.03 MIL/uL — ABNORMAL LOW (ref 3.87–5.11)
Retic Count, Absolute: 495.2 10*3/uL — ABNORMAL HIGH (ref 19.0–186.0)
Retic Ct Pct: 23.8 % — ABNORMAL HIGH (ref 0.4–3.1)

## 2023-07-09 MED ORDER — SODIUM CHLORIDE 0.9% FLUSH: 9.0000 mL | INTRAVENOUS | Status: DC | PRN

## 2023-07-09 MED ORDER — ONDANSETRON HCL 4 MG/2ML IJ SOLN
4.0000 mg | Freq: Four times a day (QID) | INTRAMUSCULAR | Status: DC | PRN
  Administered 2023-07-09: 4 mg via INTRAVENOUS
  Filled 2023-07-09: qty 2

## 2023-07-09 MED ORDER — DIPHENHYDRAMINE HCL 25 MG PO CAPS
25.0000 mg | ORAL_CAPSULE | ORAL | Status: DC | PRN
  Administered 2023-07-09: 25 mg via ORAL
  Filled 2023-07-09: qty 1

## 2023-07-09 MED ORDER — SODIUM CHLORIDE 0.9 % IV SOLN: INTRAVENOUS | Status: DC | PRN

## 2023-07-09 MED ORDER — HYDROMORPHONE 1 MG/ML IV SOLN
INTRAVENOUS | Status: DC
  Administered 2023-07-09: 30 mg via INTRAVENOUS
  Administered 2023-07-09: 11.5 mg via INTRAVENOUS
  Filled 2023-07-09: qty 30

## 2023-07-09 MED ORDER — KETOROLAC TROMETHAMINE 30 MG/ML IJ SOLN: 15.0000 mg | Freq: Once | INTRAMUSCULAR | Status: DC

## 2023-07-09 MED ORDER — NALOXONE HCL 0.4 MG/ML IJ SOLN: 0.4000 mg | INTRAMUSCULAR | Status: DC | PRN

## 2023-07-09 MED ORDER — ACETAMINOPHEN 500 MG PO TABS: 1000.0000 mg | ORAL_TABLET | Freq: Once | ORAL | Status: DC

## 2023-07-09 NOTE — Progress Notes (Signed)
Patient admitted to the day hospital for sickle cell pain. Initially, patient reported bilateral hip, knee and back pain rated 9/10. For pain management, patient placed on Sickle Cell Dose Dilaudid PCA and hydrated with IV fluids. PO Tylenol and IV Toradol ordered but patient refused. At discharge, patient rated pain at 6/10. Vital signs stable. Printed AVS offered but patient refused. Patient alert, oriented and ambulatory at discharge.

## 2023-07-09 NOTE — Discharge Summary (Signed)
[facility] Discharge Summary   Patient ID: Sandra Mckay MRN: [id number] DOB/AGE: July 24, 1996 27 y.o.  Admit date: 07/09/2023 Discharge date: 07/09/2023  Primary Care Physician:  French Ana, FNP  Admission Diagnoses:  Principal Problem:   Sickle-cell disease with pain [facility])  Discharge Medications:  Allergies as of 07/09/2023       Reactions   Latex Rash   Wound Dressing Adhesive Rash        Medication List     TAKE these medications    oxyCODONE 15 MG immediate release tablet Commonly known as: ROXICODONE Take 15 mg by mouth every 4 (four) hours as needed for pain.   QUEtiapine 100 MG tablet Commonly known as: SEROQUEL Take 100 mg by mouth at bedtime.   Vitamin D (Ergocalciferol) 1.25 MG (50000 UNIT) Caps capsule Commonly known as: DRISDOL Take 50,000 Units by mouth every 7 (seven) days.   Xarelto 20 MG Tabs tablet Generic drug: rivaroxaban TAKE 1 TABLET BY MOUTH ONCE DAILY WITH SUPPER   Xtampza ER 27 MG C12a Generic drug: oxyCODONE ER Take 1 capsule by mouth 2 (two) times daily.         Consults:  None  Significant Diagnostic Studies:  No results found.  History of present illness:  Sandra Mckay is a 27 year old female with a medical history significant for sickle cell disease, chronic pain syndrome, opiate dependence and tolerance presents with complaints of generalized pain consistent with typical sickle cell pain crisis.  Patient states the pain intensity has been increasing over the past 24 hours and unrelieved by her home medications.  She characterizes pain as sore and primarily to back upper and lower extremities.  Patient rates pain as 9/10.  She last had oxycodone this a.m. without very much relief.  Patient denies fever, chills, chest pain, or shortness of breath.  No urinary symptoms, nausea, vomiting, or diarrhea.  No sick contacts or recent travel.  Patient attributes pain crisis to changes in weather.  [facility] Course: Patient admitted to sickle cell day infusion clinic for management of pain crisis.  Reviewed all laboratory values, largely consistent with her baseline. Pain managed with IV Dilaudid PCA, Toradol, and Tylenol. Pain intensity decreased to 6/10 and patient is requesting discharge home. She is alert, oriented, and ambulating without assistance.  Patient will discharge home in a hemodynamically stable condition.  Discharge instructions: Resume all home medications.   Follow up with PCP as previously  scheduled.   Discussed the importance of drinking 64 ounces of water daily, dehydration of red blood cells may lead further sickling.   Avoid all stressors that precipitate sickle cell pain crisis.     The patient was given clear instructions to go to ER or return to medical center if symptoms do not improve, worsen or new problems develop.     Physical Exam at Discharge:  BP (!) 102/46 (BP Location: Right Arm)   Pulse 67   Temp 98.5 F (36.9 C) (Temporal)   Resp 14   LMP 06/01/2023 (Exact Date)   SpO2 95%  Physical Exam Constitutional:      Appearance: Normal appearance.  Eyes:     Pupils: Pupils are equal, round, and reactive to light.  Cardiovascular:     Rate and Rhythm: Normal rate and regular rhythm.     Pulses: Normal pulses.     Heart sounds: Normal heart sounds.  Abdominal:     General: Bowel sounds are normal.  Skin:  General: Skin is warm.  Neurological:     General: No focal deficit present.     Mental Status: She is alert. Mental status is at baseline.  Psychiatric:        Mood and Affect: Mood normal.        Behavior: Behavior normal.        Thought Content: Thought content normal.        Judgment: Judgment normal.      Disposition at Discharge: Discharge disposition: 01-Home or Self Care       Discharge Orders: Discharge Instructions     Discharge patient   Complete by: As directed    Discharge disposition:  01-Home or Self Care   Discharge patient date: 07/09/2023       Condition at Discharge:   Stable  Time spent on Discharge:  Greater than 30 minutes.  Signed: [name]  APRN, MSN, FNP-C Patient Care [facility] [address] [place], [place] [address] [phone]  07/09/2023, 4:08 PM

## 2023-07-09 NOTE — Telephone Encounter (Signed)
Pt called day hospital wanting to come in today for sickle cell pain treatment. Pt reports 8/10 pain to back, hips, and knees.Pt reports taking Oxycodone 15 mg at 3 AM. Pt denies being to the ER recently. Pt screened for COVID and denies symptoms and exposures. Pt denies fever, chest pain, N/V/D, and abdominal pain. [place] Hollis, FNP notified and per provider patient can be seen at the day hospital today. Pt notified and verbalized understanding. Per pt she will need to use taxi transportation services today.

## 2023-07-09 NOTE — H&P (Signed)
[facility] History and Physical   Date: 07/09/2023  Patient name: Sandra Mckay Medical record number: [id number] Date of birth: 12-Dec-1995 Age: 27 y.o. Gender: female PCP: French Ana, FNP  Attending physician: No att. providers found  Chief Complaint: Sickle cell pain  History of present illness:  Quaneesha Chesler is a 27 year old female with a medical history significant for sickle cell disease, chronic pain syndrome, opiate dependence and tolerance presents with complaints of generalized pain consistent with typical sickle cell pain crisis.  Patient states the pain intensity has been increasing over the past 24 hours and unrelieved by her home medications.  She characterizes pain as sore and primarily to back upper and lower extremities.  Patient rates pain as 9/10.  She last had oxycodone this a.m. without very much relief.  Patient denies fever, chills, chest pain, or shortness of breath.  No urinary symptoms, nausea, vomiting, or diarrhea.  No sick contacts or recent travel.  Patient attributes pain crisis to changes in weather.  Meds: No medications prior to admission.    Allergies: Latex and Wound dressing adhesive Past Medical History:  Diagnosis Date   Acute cystitis without hematuria 10/21/2019   Blood transfusion without reported diagnosis    Bronchitis    Chickenpox    Depression    Elevated ferritin level 05/2019   Heart murmur    Nausea without vomiting 09/09/2015   Pulmonary hypertension (HCC)    Sickle cell anemia (HCC)    Sickle cell disease, type SS (HCC)    Sickle cell pain crisis (HCC) 12/05/2016   Thrombocytosis 05/2019   Urinary tract infection    Vitamin D deficiency    Past Surgical History:  Procedure Laterality Date   CHOLECYSTECTOMY  2011   EYE SURGERY     Sty removal   IR IMAGING GUIDED PORT INSERTION  03/06/2022   IR REMOVAL TUN ACCESS W/ PORT W/O FL MOD SED  05/29/2022   LABIAL ADHESION LYSIS  1999   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[facility] 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   TONSILLECTOMY  2012   Family History  Problem Relation Age of Onset   Arthritis Other        grandparent   Stroke Other    Hypertension Other    Diabetes Other        grandparent   Cancer - Other Other        Glioblastoma   Social History   Socioeconomic History   Marital status: Single    Spouse name: Not on file   Number of children: Not on file   Years of education: Not on file   Highest education level: Not on file  Occupational History   Not on file  Tobacco Use   Smoking status: Some Days    Current packs/day: 1.00    Types: Cigarettes   Smokeless tobacco: Never   Tobacco comments:    1 pack twice a week  Vaping Use   Vaping status: Never Used  Substance and Sexual Activity   Alcohol use: Yes    Alcohol/week: 0.0 standard drinks of alcohol    Comment: occ   Drug use: Yes    Types: Marijuana    Comment: sometimes   Sexual activity: Yes  Other Topics Concern   Not on file  Social History Narrative   Lives with mom in a one story home.  Education: 2 years of college.    Social Determinants of Health  Financial Resource Strain: Low Risk  (01/28/2022)   Received from [facility], [facility]   Overall Financial Resource Strain (CARDIA)    Difficulty of Paying Living Expenses: Not very hard  Food Insecurity: No Food Insecurity (06/17/2023)   Hunger Vital Sign    Worried About Running Out of Food in the Last Year: Never true    Ran Out of Food in the Last Year: Never true  Transportation Needs: No Transportation Needs (06/17/2023)   PRAPARE - Administrator, Civil Service (Medical): No    Lack of Transportation (Non-Medical): No  Physical Activity: Not on file  Stress: No Stress Concern Present (01/28/2022)   Received from [facility], [facility] Occupational Health - Occupational Stress Questionnaire    Feeling of Stress : Only a little  Social Connections:  Unknown (11/30/2021)   Received from [facility], [facility]   Social Network    Social Network: Not on file  Intimate Partner Violence: Not At Risk (06/13/2023)   Humiliation, Afraid, Rape, and Kick questionnaire    Fear of Current or Ex-Partner: No    Emotionally Abused: No    Physically Abused: No    Sexually Abused: No   Review of Systems  Constitutional:  Negative for chills and fever.  HENT: Negative.    Eyes: Negative.   Respiratory: Negative.    Cardiovascular: Negative.   Gastrointestinal: Negative.   Genitourinary: Negative.   Musculoskeletal:  Positive for back pain and joint pain.  Skin: Negative.   Neurological: Negative.   Endo/Heme/Allergies: Negative.   Psychiatric/Behavioral: Negative.       Physical Exam: Blood pressure (!) 100/51, pulse 63, temperature 98.5 F (36.9 C), temperature source Temporal, resp. rate 11, last menstrual period 06/01/2023, SpO2 96%. Physical Exam Constitutional:      Appearance: Normal appearance.  Eyes:     Pupils: Pupils are equal, round, and reactive to light.  Cardiovascular:     Rate and Rhythm: Normal rate and regular rhythm.  Pulmonary:     Effort: Pulmonary effort is normal.     Breath sounds: Normal breath sounds.  Abdominal:     General: Bowel sounds are normal.  Musculoskeletal:        General: Normal range of motion.  Neurological:     General: No focal deficit present.     Mental Status: She is alert. Mental status is at baseline.  Psychiatric:        Mood and Affect: Mood normal.        Behavior: Behavior normal.        Thought Content: Thought content normal.        Judgment: Judgment normal.      Lab results: Results for orders placed or performed during the hospital encounter of 07/09/23 (from the past 24 hour(s))  CBC with Differential/Platelet     Status: Abnormal   Collection Time: 07/09/23 10:40 AM  Result Value Ref Range   WBC 9.5 4.0 - 10.5 K/uL   RBC 2.05 (L) 3.87 - 5.11 MIL/uL    Hemoglobin 7.7 (L) 12.0 - 15.0 g/dL   HCT 16.1 (L) 09.6 - 04.5 %   MCV 104.4 (H) 80.0 - 100.0 fL   MCH 37.6 (H) 26.0 - 34.0 pg   MCHC 36.0 30.0 - 36.0 g/dL   RDW 40.9 (H) 81.1 - 91.4 %   Platelets 476 (H) 150 - 400 K/uL   nRBC 2.0 (H) 0.0 - 0.2 %  Neutrophils Relative % 49 %   Neutro Abs 4.6 1.7 - 7.7 K/uL   Lymphocytes Relative 32 %   Lymphs Abs 3.1 0.7 - 4.0 K/uL   Monocytes Relative 15 %   Monocytes Absolute 1.5 (H) 0.1 - 1.0 K/uL   Eosinophils Relative 1 %   Eosinophils Absolute 0.1 0.0 - 0.5 K/uL   Basophils Relative 1 %   Basophils Absolute 0.1 0.0 - 0.1 K/uL   Immature Granulocytes 2 %   Abs Immature Granulocytes 0.21 (H) 0.00 - 0.07 K/uL   Polychromasia PRESENT    Sickle Cells PRESENT    Target Cells PRESENT   Comprehensive metabolic panel     Status: Abnormal   Collection Time: 07/09/23 10:40 AM  Result Value Ref Range   Sodium 136 135 - 145 mmol/L   Potassium 4.4 3.5 - 5.1 mmol/L   Chloride 105 98 - 111 mmol/L   CO2 23 22 - 32 mmol/L   Glucose, Bld 83 70 - 99 mg/dL   BUN 8 6 - 20 mg/dL   Creatinine, Ser 7.12 (L) 0.44 - 1.00 mg/dL   Calcium 9.3 8.9 - 45.8 mg/dL   Total Protein 7.8 6.5 - 8.1 g/dL   Albumin 4.8 3.5 - 5.0 g/dL   AST 70 (H) 15 - 41 U/L   ALT 57 (H) 0 - 44 U/L   Alkaline Phosphatase 82 38 - 126 U/L   Total Bilirubin 2.5 (H) <1.2 mg/dL   GFR, Estimated >09 >98 mL/min   Anion gap 8 5 - 15  Reticulocytes     Status: Abnormal   Collection Time: 07/09/23 10:40 AM  Result Value Ref Range   Retic Ct Pct 23.8 (H) 0.4 - 3.1 %   RBC. 2.03 (L) 3.87 - 5.11 MIL/uL   Retic Count, Absolute 495.2 (H) 19.0 - 186.0 K/uL   Immature Retic Fract 33.3 (H) 2.3 - 15.9 %    Imaging results:  No results found.   Assessment & Plan: Patient admitted to sickle cell day infusion center for management of pain crisis.  Patient is opiate tolerant Initiate IV dilaudid PCA.  Toradol 15 mg IV times one dose Tylenol 1000 mg by mouth times one dose Review CBC with  differential, complete metabolic panel, and reticulocytes as results become available.  Pain intensity will be reevaluated in context of functioning and relationship to baseline as care progresses If pain intensity remains elevated and/or sudden change in hemodynamic stability transition to inpatient services for higher level of care.      [name]  APRN, MSN, FNP-C Patient Care [facility] [address] [place], [place] [address] [phone]  07/09/2023, 4:41 PM

## 2023-08-26 ENCOUNTER — Telehealth (HOSPITAL_COMMUNITY): Payer: Self-pay

## 2023-08-26 ENCOUNTER — Non-Acute Institutional Stay (HOSPITAL_COMMUNITY)
Admission: AD | Admit: 2023-08-26 | Discharge: 2023-08-26 | Disposition: A | Discharge status: Home / Self Care | Payer: Medicare Other | Source: Ambulatory Visit | Attending: Internal Medicine | Admitting: Internal Medicine

## 2023-08-26 DIAGNOSIS — D638 Anemia in other chronic diseases classified elsewhere: Secondary | ICD-10-CM | POA: Diagnosis not present

## 2023-08-26 DIAGNOSIS — D5700 Hb-SS disease with crisis, unspecified: Secondary | ICD-10-CM | POA: Diagnosis present

## 2023-08-26 DIAGNOSIS — G894 Chronic pain syndrome: Secondary | ICD-10-CM | POA: Diagnosis not present

## 2023-08-26 LAB — CBC WITH DIFFERENTIAL/PLATELET
Abs Immature Granulocytes: 0.09 10*3/uL — ABNORMAL HIGH (ref 0.00–0.07)
Basophils Absolute: 0.1 10*3/uL (ref 0.0–0.1)
Basophils Relative: 1 %
Eosinophils Absolute: 0.1 10*3/uL (ref 0.0–0.5)
Eosinophils Relative: 1 %
HCT: 20.9 % — ABNORMAL LOW (ref 36.0–46.0)
Hemoglobin: 7.3 g/dL — ABNORMAL LOW (ref 12.0–15.0)
Immature Granulocytes: 1 %
Lymphocytes Relative: 30 %
Lymphs Abs: 3.2 10*3/uL (ref 0.7–4.0)
MCH: 38.4 pg — ABNORMAL HIGH (ref 26.0–34.0)
MCHC: 34.9 g/dL (ref 30.0–36.0)
MCV: 110 fL — ABNORMAL HIGH (ref 80.0–100.0)
Monocytes Absolute: 2 10*3/uL — ABNORMAL HIGH (ref 0.1–1.0)
Monocytes Relative: 19 %
Neutro Abs: 5.1 10*3/uL (ref 1.7–7.7)
Neutrophils Relative %: 48 %
Platelets: 517 10*3/uL — ABNORMAL HIGH (ref 150–400)
RBC: 1.9 MIL/uL — ABNORMAL LOW (ref 3.87–5.11)
RDW: 24.4 % — ABNORMAL HIGH (ref 11.5–15.5)
WBC: 10.6 10*3/uL — ABNORMAL HIGH (ref 4.0–10.5)
nRBC: 3.3 % — ABNORMAL HIGH (ref 0.0–0.2)

## 2023-08-26 LAB — COMPREHENSIVE METABOLIC PANEL
ALT: 33 U/L (ref 0–44)
AST: 39 U/L (ref 15–41)
Albumin: 4.7 g/dL (ref 3.5–5.0)
Alkaline Phosphatase: 91 U/L (ref 38–126)
Anion gap: 9 (ref 5–15)
BUN: 7 mg/dL (ref 6–20)
CO2: 23 mmol/L (ref 22–32)
Calcium: 9.5 mg/dL (ref 8.9–10.3)
Chloride: 106 mmol/L (ref 98–111)
Creatinine, Ser: 0.46 mg/dL (ref 0.44–1.00)
GFR, Estimated: >60 mL/min (ref >60)
Glucose, Bld: 97 mg/dL (ref 70–99)
Potassium: 3.9 mmol/L (ref 3.5–5.1)
Sodium: 138 mmol/L (ref 135–145)
Total Bilirubin: 2.3 mg/dL — ABNORMAL HIGH (ref 0.0–1.2)
Total Protein: 7.8 g/dL (ref 6.5–8.1)

## 2023-08-26 LAB — LACTATE DEHYDROGENASE: LDH: 410 U/L — ABNORMAL HIGH (ref 98–192)

## 2023-08-26 MED ORDER — POLYETHYLENE GLYCOL 3350 17 G PO PACK: 17.0000 g | PACK | Freq: Every day | ORAL | Status: DC | PRN

## 2023-08-26 MED ORDER — SODIUM CHLORIDE 0.9% FLUSH: 9.0000 mL | INTRAVENOUS | Status: DC | PRN

## 2023-08-26 MED ORDER — ONDANSETRON HCL 4 MG/2ML IJ SOLN
4.0000 mg | Freq: Four times a day (QID) | INTRAMUSCULAR | Status: DC | PRN
Start: 2023-08-26 — End: 2023-08-26
  Administered 2023-08-26: 4 mg via INTRAVENOUS
  Filled 2023-08-26: qty 2

## 2023-08-26 MED ORDER — SENNOSIDES-DOCUSATE SODIUM 8.6-50 MG PO TABS
1.0000 | ORAL_TABLET | Freq: Two times a day (BID) | ORAL | Status: DC
  Administered 2023-08-26: 1 via ORAL
  Filled 2023-08-26: qty 1

## 2023-08-26 MED ORDER — DEXTROSE-SODIUM CHLORIDE 5-0.45 % IV SOLN: INTRAVENOUS | Status: DC

## 2023-08-26 MED ORDER — NALOXONE HCL 0.4 MG/ML IJ SOLN: 0.4000 mg | INTRAMUSCULAR | Status: DC | PRN

## 2023-08-26 MED ORDER — DIPHENHYDRAMINE HCL 25 MG PO CAPS
25.0000 mg | ORAL_CAPSULE | ORAL | Status: DC | PRN
  Administered 2023-08-26: 25 mg via ORAL
  Filled 2023-08-26: qty 1

## 2023-08-26 MED ORDER — HYDROMORPHONE 1 MG/ML IV SOLN
INTRAVENOUS | Status: DC
  Administered 2023-08-26: 12.5 mg via INTRAVENOUS
  Administered 2023-08-26: 30 mg via INTRAVENOUS
  Filled 2023-08-26: qty 30

## 2023-08-26 MED ORDER — KETOROLAC TROMETHAMINE 15 MG/ML IJ SOLN
15.0000 mg | Freq: Four times a day (QID) | INTRAMUSCULAR | Status: DC
  Administered 2023-08-26: 15 mg via INTRAVENOUS
  Filled 2023-08-26: qty 1

## 2023-08-26 NOTE — Telephone Encounter (Signed)
Pt called day hospital today for sickle cell pain treatment. Pt reports 8/10 pain to knees, back, and shoulders. Pt reports taking Oxycodone and Xtampza at 4 AM. Pt reports that she was discharged from the hospital about a week ago. Pt screened for COVID and denies symptoms and exposures. Pt denies fever, chest pain, vomiting, diarrhea, and abdominal pain. Pt endorses having some nausea. Dr. Mikeal Hawthorne notified and per provider, patient can come to the day hospital today. Pt notified and verbalized understanding. Pt states that she will use taxi transportation services today.

## 2023-08-26 NOTE — H&P (Signed)
History and Physical    Patient: Daniele Dillow WUJ:[id number] DOB: Aug 21, 1995 DOA: 08/26/2023 DOS: the patient was seen and examined on 08/26/2023 PCP: French Ana, FNP  Patient coming from: Home  Chief Complaint: No chief complaint on file.  HPI: Zell Doucette is a 28 y.o. female with medical history significant of sickle cell pain crisis who presents to the day hospital with pain bilateral hip knee and back pain rated 8 out of 10.  Patient was admitted to the sickle cell day hospital for pain management.  Denied any fever or chills no nausea vomiting or diarrhea.  Review of Systems: As mentioned in the history of present illness. All other systems reviewed and are negative. Past Medical History:  Diagnosis Date   Acute cystitis without hematuria 10/21/2019   Blood transfusion without reported diagnosis    Bronchitis    Chickenpox    Depression    Elevated ferritin level 05/2019   Heart murmur    Nausea without vomiting 09/09/2015   Pulmonary hypertension (HCC)    Sickle cell anemia (HCC)    Sickle cell disease, type SS (HCC)    Sickle cell pain crisis (HCC) 12/05/2016   Thrombocytosis 05/2019   Urinary tract infection    Vitamin D deficiency    Past Surgical History:  Procedure Laterality Date   CHOLECYSTECTOMY  2011   EYE SURGERY     Sty removal   IR IMAGING GUIDED PORT INSERTION  03/06/2022   IR REMOVAL TUN ACCESS W/ PORT W/O FL MOD SED  05/29/2022   LABIAL ADHESION LYSIS  1999   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 She has never used smokeless tobacco. She reports current alcohol use. She reports current drug use. Drug: Marijuana.  Allergies  Allergen Reactions   Latex Rash   Wound Dressing Adhesive Rash    Family History  Problem Relation Age of Onset   Arthritis Other        grandparent   Stroke Other    Hypertension Other     Diabetes Other        grandparent   Cancer - Other Other        Glioblastoma    Prior to Admission medications   Medication Sig Start Date End Date Taking? Authorizing Provider  oxyCODONE (ROXICODONE) 15 MG immediate release tablet Take 15 mg by mouth every 4 (four) hours as needed for pain.    [provider]  QUEtiapine (SEROQUEL) 100 MG tablet Take 100 mg by mouth at bedtime.    [provider]  Vitamin D, Ergocalciferol, (DRISDOL) 1.25 MG (50000 UNIT) CAPS capsule Take 50,000 Units by mouth every 7 (seven) days.    [provider]  XARELTO 20 MG TABS tablet TAKE 1 TABLET BY MOUTH ONCE DAILY WITH SUPPER 06/30/23   [name], MD  XTAMPZA ER 27 MG C12A Take 1 capsule by mouth 2 (two) times daily. 05/17/23   [provider]    Physical Exam: There were no vitals filed for this visit. Constitutional: NAD, calm, comfortable Eyes: PERRL, lids and conjunctivae normal ENMT: Mucous membranes are moist. Posterior pharynx clear of any exudate or lesions.Normal dentition.  Neck: normal, supple, no masses, no thyromegaly Respiratory: clear to auscultation bilaterally, no wheezing, no crackles. Normal respiratory effort. No accessory muscle use.  Cardiovascular: Regular rate and rhythm, no murmurs / rubs /  gallops. No extremity edema. 2+ pedal pulses. No carotid bruits.  Abdomen: no tenderness, no masses palpated. No hepatosplenomegaly. Bowel sounds positive.  Musculoskeletal: Good range of motion, no joint swelling or tenderness, Skin: no rashes, lesions, ulcers. No induration Neurologic: CN 2-12 grossly intact. Sensation intact, DTR normal. Strength 5/5 in all 4.  Psychiatric: Normal judgment and insight. Alert and oriented x 3. Normal mood  Data Reviewed:  There are no new results to review at this time.  Assessment and Plan:  #1 sickle cell pain crisis: Patient will be admitted to the day hospital.  Initiate Dilaudid PCA, Toradol, IV fluids.   Continue other supportive care.  #2 anemia of chronic disease: Continue to monitor H&H.  #3 chronic pain syndrome: Continue chronic pain medications.    Advance Care Planning:   Code Status: Full Code   Consults: None  Family Communication: No family at bedside  Severity of Illness: The appropriate patient status for this patient is OBSERVATION. Observation status is judged to be reasonable and necessary in order to provide the required intensity of service to ensure the patient's safety. The patient's presenting symptoms, physical exam findings, and initial radiographic and laboratory data in the context of their medical condition is felt to place them at decreased risk for further clinical deterioration. Furthermore, it is anticipated that the patient will be medically stable for discharge from the hospital within 2 midnights of admission.   AuthorLonia Blood, MD 08/26/2023 9:35 AM  For on call review [email or web address].

## 2023-08-26 NOTE — Progress Notes (Signed)
Patient admitted to the day hospital for sickle cell pain. Initially, patient reported bilateral hip, knee and back pain rated 8/10. For pain management, patient placed on Sickle Cell Dose Dilaudid PCA, given IV Toradol and hydrated with IV fluids. At discharge, patient rated pain at 6/10. Vital signs stable. AVS offered but patient refused. Patient alert, oriented and ambulatory at discharge.

## 2023-08-26 NOTE — Discharge Summary (Signed)
Physician Discharge Summary   Patient: Sandra Mckay MRN: [id number] DOB: May 04, 1996  Admit date:     08/26/2023  Discharge date: 08/26/2023  Discharge Physician: Lonia Blood   PCP: French Ana, FNP   Recommendations at discharge:   Resume home regimen.  Follow-up with PCP  Discharge Diagnoses: Active Problems:   Sickle cell pain crisis (HCC)  Resolved Problems:   * No resolved hospital problems. [facility] Course: Patient was admitted to the sickle cell day hospital with pain rated as 8 out of 10.  Patient has pain in the legs and the hips.  Consistent with her typical sickle cell crisis.  Patient initiated Dilaudid PCA, Toradol and IV fluids patient's pain drifted down to 6 out of 10 she feels better at time of discharge.  Patient discharged home to follow-up with her PCP.  Assessment and Plan: No notes have been filed under this hospital service. Service: Hospitalist        Consultants: None Procedures performed: None Disposition: Home Diet recommendation:  Discharge Diet Orders (From admission, onward)     Start     Ordered   08/26/23 0000  Diet - low sodium heart healthy        08/26/23 1552           Regular diet DISCHARGE MEDICATION: Allergies as of 08/26/2023       Reactions   Latex Rash   Wound Dressing Adhesive Rash        Medication List     TAKE these medications    oxyCODONE 15 MG immediate release tablet Commonly known as: ROXICODONE Take 15 mg by mouth every 4 (four) hours as needed for pain.   QUEtiapine 100 MG tablet Commonly known as: SEROQUEL Take 100 mg by mouth at bedtime.   Vitamin D (Ergocalciferol) 1.25 MG (50000 UNIT) Caps capsule Commonly known as: DRISDOL Take 50,000 Units by mouth every 7 (seven) days.   Xarelto 20 MG Tabs tablet Generic drug: rivaroxaban TAKE 1 TABLET BY MOUTH ONCE DAILY WITH SUPPER   Xtampza ER 27 MG C12a Generic drug: oxyCODONE ER Take 1 capsule by mouth 2 (two) times daily.         Discharge Exam: There were no vitals filed for this visit. Constitutional: NAD, calm, comfortable Eyes: PERRL, lids and conjunctivae normal ENMT: Mucous membranes are moist. Posterior pharynx clear of any exudate or lesions.Normal dentition.  Neck: normal, supple, no masses, no thyromegaly Respiratory: clear to auscultation bilaterally, no wheezing, no crackles. Normal respiratory effort. No accessory muscle use.  Cardiovascular: Regular rate and rhythm, no murmurs / rubs / gallops. No extremity edema. 2+ pedal pulses. No carotid bruits.  Abdomen: no tenderness, no masses palpated. No hepatosplenomegaly. Bowel sounds positive.  Musculoskeletal: Good range of motion, no joint swelling or tenderness, Skin: no rashes, lesions, ulcers. No induration Neurologic: CN 2-12 grossly intact. Sensation intact, DTR normal. Strength 5/5 in all 4.  Psychiatric: Normal judgment and insight. Alert and oriented x 3. Normal mood   Condition at discharge: good  The results of significant diagnostics from this hospitalization (including imaging, microbiology, ancillary and laboratory) are listed below for reference.   Imaging Studies: No results found.  Microbiology: Results for orders placed or performed during the hospital encounter of 10/22/22  Urine Culture (for pregnant, neutropenic or urologic patients or patients with an indwelling urinary catheter)     Status: Abnormal   Collection Time: 10/22/22 12:35 PM   Specimen: Urine, Clean Catch  Result Value Ref Range Status  Specimen Description   Final    URINE, CLEAN CATCH Performed at [facility], [address] [place], [place] [address]    Special Requests   Final    NONE Performed at [facility], [address] [place], [place] [address]    Culture >=100,000 COLONIES/mL KLEBSIELLA PNEUMONIAE (A)  Final   Report Status 10/24/2022 FINAL  Final   Organism ID, Bacteria KLEBSIELLA PNEUMONIAE (A)   Final      Susceptibility   Klebsiella pneumoniae - MIC*    AMPICILLIN RESISTANT Resistant     CEFAZOLIN <=4 SENSITIVE Sensitive     CEFEPIME <=0.12 SENSITIVE Sensitive     CEFTRIAXONE <=0.25 SENSITIVE Sensitive     CIPROFLOXACIN <=0.25 SENSITIVE Sensitive     GENTAMICIN <=1 SENSITIVE Sensitive     IMIPENEM <=0.25 SENSITIVE Sensitive     NITROFURANTOIN <=16 SENSITIVE Sensitive     TRIMETH/SULFA <=20 SENSITIVE Sensitive     AMPICILLIN/SULBACTAM 4 SENSITIVE Sensitive     PIP/TAZO <=4 SENSITIVE Sensitive     * >=100,000 COLONIES/mL KLEBSIELLA PNEUMONIAE    Labs: CBC: Recent Labs  Lab 08/26/23 0930  WBC 10.6*  NEUTROABS 5.1  HGB 7.3*  HCT 20.9*  MCV 110.0*  PLT 517*   Basic Metabolic Panel: Recent Labs  Lab 08/26/23 0930  NA 138  K 3.9  CL 106  CO2 23  GLUCOSE 97  BUN 7  CREATININE 0.46  CALCIUM 9.5   Liver Function Tests: Recent Labs  Lab 08/26/23 0930  AST 39  ALT 33  ALKPHOS 91  BILITOT 2.3*  PROT 7.8  ALBUMIN 4.7   CBG: No results for input(s): "GLUCAP" in the last 168 hours.  Discharge time spent: less than 30 minutes.  SignedLonia Blood, MD [facility] 08/29/2023

## 2023-08-29 NOTE — Hospital Course (Signed)
Patient was admitted to the sickle cell day hospital with pain rated as 8 out of 10.  Patient has pain in the legs and the hips.  Consistent with her typical sickle cell crisis.  Patient initiated Dilaudid PCA, Toradol and IV fluids patient's pain drifted down to 6 out of 10 she feels better at time of discharge.  Patient discharged home to follow-up with her PCP.

## 2023-09-13 ENCOUNTER — Encounter (HOSPITAL_COMMUNITY): Payer: Self-pay | Admitting: Family

## 2023-09-13 ENCOUNTER — Other Ambulatory Visit (HOSPITAL_COMMUNITY): Payer: Self-pay | Admitting: Family

## 2023-09-13 DIAGNOSIS — Z789 Other specified health status: Secondary | ICD-10-CM

## 2023-09-13 DIAGNOSIS — D57219 Sickle-cell/Hb-C disease with crisis, unspecified: Secondary | ICD-10-CM

## 2023-09-14 ENCOUNTER — Other Ambulatory Visit: Payer: Self-pay | Admitting: Radiology

## 2023-09-15 NOTE — H&P (Signed)
Chief Complaint: Sickle cell anemia with recent pain crisis; poor venous access; referred for Port-A-Cath placement to assist with future medication administration  Referring Provider(s): Thorarinson,M  NP  Supervising Physician: Gilmer Mor  Patient Status: [facility] - Out-pt  History of Present Illness: Sandra Mckay is a 28 y.o. female with past medical history significant for depression, heart murmur, pulmonary hypertension, sickle cell disease, vitamin D deficiency and poor venous access who presents now for Port-A-Cath placement to assist with medication administration during sickle cell crises.  Patient known to IR team from port a  cath placement on 03/06/2022 followed by removal on 05/29/2022 due to DVT in  the right IJ along with retraction of catheter and coiling within the right internal jugular vein.    Patient is Full Code  Past Medical History:  Diagnosis Date   Acute cystitis without hematuria 10/21/2019   Blood transfusion without reported diagnosis    Bronchitis    Chickenpox    Depression    Elevated ferritin level 05/2019   Heart murmur    Nausea without vomiting 09/09/2015   Pulmonary hypertension (HCC)    Sickle cell anemia (HCC)    Sickle cell disease, type SS (HCC)    Sickle cell pain crisis (HCC) 12/05/2016   Thrombocytosis 05/2019   Urinary tract infection    Vitamin D deficiency     Past Surgical History:  Procedure Laterality Date   CHOLECYSTECTOMY  2011   EYE SURGERY     Sty removal   IR IMAGING GUIDED PORT INSERTION  03/06/2022   IR REMOVAL TUN ACCESS W/ PORT W/O FL MOD SED  05/29/2022   LABIAL ADHESION LYSIS  1999   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 Authorizing Provider  oxyCODONE (ROXICODONE) 15 MG immediate release tablet Take 15 mg by mouth  every 4 (four) hours as needed for pain.    [provider]  QUEtiapine (SEROQUEL) 100 MG tablet Take 100 mg by mouth at bedtime.    [provider]  Vitamin D, Ergocalciferol, (DRISDOL) 1.25 MG (50000 UNIT) CAPS capsule Take 50,000 Units by mouth every 7 (seven) days.    [provider]  XARELTO 20 MG TABS tablet TAKE 1 TABLET BY MOUTH ONCE DAILY WITH SUPPER 06/30/23   [name], MD  XTAMPZA ER 27 MG C12A Take 1 capsule by mouth 2 (two) times daily. 05/17/23   [provider]     Family History  Problem Relation Age of Onset   Arthritis Other        grandparent   Stroke Other    Hypertension Other    Diabetes Other        grandparent   Cancer - Other Other        Glioblastoma    Social History   Socioeconomic History   Marital status: Single    Spouse name: Not on file   Number of children: Not on file   Years of education: Not on file   Highest education level: Not on file  Occupational History   Not on file  Tobacco Use   Smoking status: Some Days    Current packs/day: 1.00    Types: Cigarettes   Smokeless tobacco: Never   Tobacco comments:    1 pack twice a week  Vaping Use   Vaping status: Never Used  Substance and Sexual Activity   Alcohol use: Yes    Alcohol/week: 0.0 standard drinks of alcohol    Comment: occ   Drug use: Yes    Types: Marijuana    Comment: sometimes   Sexual activity: Yes  Other Topics Concern   Not on file  Social History Narrative   Lives with mom in a one story home.  Education: 2 years of college.    Social Drivers of Corporate investment banker Strain: Low Risk  (01/28/2022)   Received from [facility], [facility]   Overall Financial Resource Strain (CARDIA)    Difficulty of Paying Living Expenses: Not very hard  Food Insecurity: No Food Insecurity (06/17/2023)   Hunger Vital Sign    Worried About Running Out of Food in the Last Year: Never true    Ran Out of Food in the Last  Year: Never true  Transportation Needs: No Transportation Needs (08/21/2023)   Received from [facility] - Transportation    Lack of Transportation (Medical): No    Lack of Transportation (Non-Medical): No  Physical Activity: Not on file  Stress: No Stress Concern Present (01/28/2022)   Received from [facility], [facility] - Occupational Stress Questionnaire    Feeling of Stress : Only a little  Social Connections: Unknown (11/30/2021)   Received from [facility], [facility]   Social Network    Social Network: Not on file       Review of Systems  Vital Signs: LMP 08/02/2023 (Approximate)   Advance Care Plan: no documents on file   Physical Exam  Imaging: No results found.  Labs:  CBC: Recent Labs    06/15/23 0535 06/30/23 1045 07/09/23 1040 08/26/23 0930  WBC 9.4 11.9* 9.5 10.6*  HGB 7.3* 7.8* 7.7* 7.3*  HCT 20.2* 21.4* 21.4* 20.9*  PLT 394 480* 476* 517*    COAGS: Recent Labs    10/22/22 1128  INR 2.6*    BMP: Recent Labs    06/13/23 0125 06/30/23 1045 07/09/23 1040 08/26/23 0930  NA 134* 136 136 138  K 3.9 4.2 4.4 3.9  CL 104 105 105 106  CO2 22 22 23 23   GLUCOSE 98 93 83 97  BUN 8 6 8 7   CALCIUM 9.3 9.6 9.3 9.5  CREATININE 0.35* 0.46 0.34* 0.46  GFRNONAA >60 >60 >60 >60    LIVER FUNCTION TESTS: Recent Labs    06/13/23 0125 06/30/23 1045 07/09/23 1040 08/26/23 0930  BILITOT 3.2* 2.2* 2.5* 2.3*  AST 71* 35 70* 39  ALT 63* 23 57* 33  ALKPHOS 92 72 82 91  PROT 7.6 8.2* 7.8 7.8  ALBUMIN 5.1* 5.0 4.8 4.7    TUMOR MARKERS: No results for input(s): "AFPTM", "CEA", "CA199", "CHROMGRNA" in the last 8760 hours.  Assessment and Plan: 28 y.o. female with past medical history significant for depression, heart murmur, pulmonary hypertension, sickle cell disease, vitamin D deficiency and poor venous access who presents now for Port-A-Cath placement to assist with medication  administration during sickle cell crises.  Patient known to IR team from port a  cath placement on 03/06/2022 followed by removal on 05/29/2022 due to DVT in  the right IJ along with retraction of catheter and coiling within the right internal jugular vein.Risks and benefits of image guided port-a-catheter placement was discussed with the patient including, but not limited to bleeding, infection,  pneumothorax, or fibrin sheath development and need for additional procedures.  All of the patient's questions were answered, patient is agreeable to proceed. Consent signed and in chart.    Thank you for allowing our service to participate in Sandra Mckay 's care.  Electronically Signed: D. Jeananne Rama, PA-C   09/15/2023, 2:52 PM      I spent a total of    15 Minutes in face to face in clinical consultation, greater than 50% of which was counseling/coordinating care for Port-A-Cath placement

## 2023-09-16 ENCOUNTER — Other Ambulatory Visit: Payer: Self-pay

## 2023-09-16 ENCOUNTER — Ambulatory Visit (HOSPITAL_COMMUNITY)
Admission: RE | Admit: 2023-09-16 | Discharge: 2023-09-16 | Discharge status: Home / Self Care | Payer: Medicare Other | Source: Ambulatory Visit | Attending: Family | Admitting: Family

## 2023-09-16 ENCOUNTER — Encounter (HOSPITAL_COMMUNITY): Payer: Self-pay

## 2023-09-16 ENCOUNTER — Ambulatory Visit (HOSPITAL_COMMUNITY)
Admission: RE | Admit: 2023-09-16 | Discharge: 2023-09-16 | Disposition: A | Discharge status: Home / Self Care | Payer: Medicare Other | Source: Ambulatory Visit | Attending: Family | Admitting: Family

## 2023-09-16 DIAGNOSIS — Z8249 Family history of ischemic heart disease and other diseases of the circulatory system: Secondary | ICD-10-CM | POA: Diagnosis not present

## 2023-09-16 DIAGNOSIS — Z7901 Long term (current) use of anticoagulants: Secondary | ICD-10-CM | POA: Diagnosis not present

## 2023-09-16 DIAGNOSIS — D5700 Hb-SS disease with crisis, unspecified: Secondary | ICD-10-CM | POA: Diagnosis not present

## 2023-09-16 DIAGNOSIS — F17210 Nicotine dependence, cigarettes, uncomplicated: Secondary | ICD-10-CM | POA: Insufficient documentation

## 2023-09-16 DIAGNOSIS — Z789 Other specified health status: Secondary | ICD-10-CM

## 2023-09-16 DIAGNOSIS — D57219 Sickle-cell/Hb-C disease with crisis, unspecified: Secondary | ICD-10-CM | POA: Diagnosis present

## 2023-09-16 DIAGNOSIS — Z86718 Personal history of other venous thrombosis and embolism: Secondary | ICD-10-CM | POA: Diagnosis not present

## 2023-09-16 HISTORY — PX: IR IMAGING GUIDED PORT INSERTION: IMG5740

## 2023-09-16 LAB — POCT PREGNANCY, URINE: Preg Test, Ur: NEGATIVE

## 2023-09-16 MED ORDER — LIDOCAINE-EPINEPHRINE 1 %-1:100000 IJ SOLN
20.0000 mL | Freq: Once | INTRAMUSCULAR | Status: AC
  Administered 2023-09-16: 15 mL via INTRADERMAL

## 2023-09-16 MED ORDER — MIDAZOLAM HCL 2 MG/2ML IJ SOLN
INTRAMUSCULAR | Status: AC
  Filled 2023-09-16: qty 4

## 2023-09-16 MED ORDER — LIDOCAINE-EPINEPHRINE 1 %-1:100000 IJ SOLN
INTRAMUSCULAR | Status: AC
  Filled 2023-09-16: qty 1

## 2023-09-16 MED ORDER — FENTANYL CITRATE (PF) 100 MCG/2ML IJ SOLN
INTRAMUSCULAR | Status: AC
  Filled 2023-09-16: qty 2

## 2023-09-16 MED ORDER — HEPARIN SOD (PORK) LOCK FLUSH 100 UNIT/ML IV SOLN
500.0000 [IU] | Freq: Once | INTRAVENOUS | Status: AC
  Administered 2023-09-16: 500 [IU] via INTRAVENOUS

## 2023-09-16 MED ORDER — MIDAZOLAM HCL 2 MG/2ML IJ SOLN
INTRAMUSCULAR | Status: AC | PRN
Start: 2023-09-16 — End: 2023-09-16
  Administered 2023-09-16 (×3): 1 mg via INTRAVENOUS

## 2023-09-16 MED ORDER — SODIUM CHLORIDE 0.9 % IV SOLN: INTRAVENOUS | Status: DC

## 2023-09-16 MED ORDER — FENTANYL CITRATE PF 50 MCG/ML IJ SOSY
25.0000 ug | PREFILLED_SYRINGE | Freq: Once | INTRAMUSCULAR | Status: AC
  Administered 2023-09-16: 25 ug via INTRAVENOUS
  Filled 2023-09-16: qty 1

## 2023-09-16 MED ORDER — HEPARIN SOD (PORK) LOCK FLUSH 100 UNIT/ML IV SOLN
INTRAVENOUS | Status: AC
  Filled 2023-09-16: qty 5

## 2023-09-16 MED ORDER — DIPHENHYDRAMINE HCL 50 MG/ML IJ SOLN
INTRAMUSCULAR | Status: AC
  Filled 2023-09-16: qty 1

## 2023-09-16 MED ORDER — FENTANYL CITRATE (PF) 100 MCG/2ML IJ SOLN
INTRAMUSCULAR | Status: AC | PRN
  Administered 2023-09-16 (×2): 50 ug via INTRAVENOUS

## 2023-09-16 NOTE — Discharge Instructions (Signed)
Please call Interventional Radiology clinic [phone] with any questions or concerns.  You may remove your dressing and shower tomorrow.  DO NOT use EMLA cream on your port site for 2 weeks as this cream will remove surgical glue on your incision.  Implanted Port Insertion, Care After This sheet gives you information about how to care for yourself after your procedure. Your health care provider may also give you more specific instructions. If you have problems or questions, contact your health care provider. What can I expect after the procedure? After the procedure, it is common to have: Discomfort at the port insertion site. Bruising on the skin over the port. This should improve over 3-4 days. Follow these instructions at home: [facility] care After your port is placed, you will get a manufacturer's information card. The card has information about your port. Keep this card with you at all times. Take care of the port as told by your health care provider. Ask your health care provider if you or a family member can get training for taking care of the port at home. A home health care nurse may also take care of the port. Make sure to remember what type of port you have. Incision care Follow instructions from your health care provider about how to take care of your port insertion site. Make sure you: Wash your hands with soap and water before and after you change your bandage (dressing). If soap and water are not available, use hand sanitizer. Change your dressing as told by your health care provider. Leave stitches (sutures), skin glue, or adhesive strips in place. These skin closures may need to stay in place for 2 weeks or longer. If adhesive strip edges start to loosen and curl up, you may trim the loose edges. Do not remove adhesive strips completely unless your health care provider tells you to do that. Check your port insertion site every day for signs of infection. Check for: Redness,  swelling, or pain. Fluid or blood. Warmth. Pus or a bad smell.        Activity Return to your normal activities as told by your health care provider. Ask your health care provider what activities are safe for you. Do not lift anything that is heavier than 10 lb (4.5 kg), or the limit that you are told, until your health care provider says that it is safe. General instructions Take over-the-counter and prescription medicines only as told by your health care provider. Do not take baths, swim, or use a hot tub until your health care provider approves. Ask your health care provider if you may take showers. You may only be allowed to take sponge baths. Do not drive for 24 hours if you were given a sedative during your procedure. Wear a medical alert bracelet in case of an emergency. This will tell any health care providers that you have a port. Keep all follow-up visits as told by your health care provider. This is important. Contact a health care provider if: You have a fever or chills. You have redness, swelling, or pain around your port insertion site. You have fluid or blood coming from your port insertion site. Your port insertion site feels warm to the touch. You have pus or a bad smell coming from the port insertion site. Get help right away if: You have chest pain or shortness of breath. You have bleeding from your port that you cannot control. Summary Take care of the port as told by your health  care provider. Keep the manufacturer's information card with you at all times. Change your dressing as told by your health care provider. Contact a health care provider if you have a fever or chills or if you have redness, swelling, or pain around your port insertion site. Keep all follow-up visits as told by your health care provider. This information is not intended to replace advice given to you by your health care provider. Make sure you discuss any questions you have with your health  care provider. Document Revised: 02/15/2018 Document Reviewed: 02/15/2018 Elsevier Patient Education  2021 Elsevier Inc.   Moderate Conscious Sedation, Adult, Care After This sheet gives you information about how to care for yourself after your procedure. Your health care provider may also give you more specific instructions. If you have problems or questions, contact your health care provider. What can I expect after the procedure? After the procedure, it is common to have: Sleepiness for several hours. Impaired judgment for several hours. Difficulty with balance. Vomiting if you eat too soon. Follow these instructions at home: For the time period you were told by your health care provider: Rest. Do not participate in activities where you could fall or become injured. Do not drive or use machinery. Do not drink alcohol. Do not take sleeping pills or medicines that cause drowsiness. Do not make important decisions or sign legal documents. Do not take care of children on your own.        Eating and drinking Follow the diet recommended by your health care provider. Drink enough fluid to keep your urine pale yellow. If you vomit: Drink water, juice, or soup when you can drink without vomiting. Make sure you have little or no nausea before eating solid foods.    General instructions Take over-the-counter and prescription medicines only as told by your health care provider. Have a responsible adult stay with you for the time you are told. It is important to have someone help care for you until you are awake and alert. Do not smoke. Keep all follow-up visits as told by your health care provider. This is important. Contact a health care provider if: You are still sleepy or having trouble with balance after 24 hours. You feel light-headed. You keep feeling nauseous or you keep vomiting. You develop a rash. You have a fever. You have redness or swelling around the IV site. Get  help right away if: You have trouble breathing. You have new-onset confusion at home. Summary After the procedure, it is common to feel sleepy, have impaired judgment, or feel nauseous if you eat too soon. Rest after you get home. Know the things you should not do after the procedure. Follow the diet recommended by your health care provider and drink enough fluid to keep your urine pale yellow. Get help right away if you have trouble breathing or new-onset confusion at home. This information is not intended to replace advice given to you by your health care provider. Make sure you discuss any questions you have with your health care provider. Document Revised: 11/17/2019 Document Reviewed: 06/15/2019 Elsevier Patient Education  2021 [employer].

## 2023-09-16 NOTE — Progress Notes (Signed)
Pt c/o pain rated as 10/10 on back, bilateral legs and hip upon return from [place] insertion procedure. VSS. Caryn Bee PA notified. New order received.

## 2023-09-16 NOTE — Procedures (Signed)
Interventional Radiology Procedure Note  Procedure: Placement of a right IJ approach single lumen PowerPort.  Tip is positioned at the superior cavoatrial junction and catheter is ready for immediate use.  Complications: None Recommendations:  - Ok to shower tomorrow - Do not submerge for 7 days - Routine line care   Signed,  Yvone Neu. Loreta Ave, DO

## 2023-09-30 ENCOUNTER — Telehealth (HOSPITAL_COMMUNITY): Payer: Self-pay | Admitting: *Deleted

## 2023-09-30 ENCOUNTER — Non-Acute Institutional Stay (HOSPITAL_COMMUNITY)
Admission: AD | Admit: 2023-09-30 | Discharge: 2023-09-30 | Disposition: A | Discharge status: Home / Self Care | Payer: Medicare Other | Source: Ambulatory Visit | Attending: Internal Medicine | Admitting: Internal Medicine

## 2023-09-30 DIAGNOSIS — D5700 Hb-SS disease with crisis, unspecified: Secondary | ICD-10-CM | POA: Insufficient documentation

## 2023-09-30 LAB — COMPREHENSIVE METABOLIC PANEL
ALT: 37 U/L (ref 0–44)
AST: 53 U/L — ABNORMAL HIGH (ref 15–41)
Albumin: 4.2 g/dL (ref 3.5–5.0)
Alkaline Phosphatase: 70 U/L (ref 38–126)
Anion gap: 9 (ref 5–15)
BUN: 8 mg/dL (ref 6–20)
CO2: 22 mmol/L (ref 22–32)
Calcium: 8.8 mg/dL — ABNORMAL LOW (ref 8.9–10.3)
Chloride: 103 mmol/L (ref 98–111)
Creatinine, Ser: 0.36 mg/dL — ABNORMAL LOW (ref 0.44–1.00)
GFR, Estimated: >60 mL/min (ref >60)
Glucose, Bld: 90 mg/dL (ref 70–99)
Potassium: 3.8 mmol/L (ref 3.5–5.1)
Sodium: 134 mmol/L — ABNORMAL LOW (ref 135–145)
Total Bilirubin: 2.8 mg/dL — ABNORMAL HIGH (ref 0.0–1.2)
Total Protein: 6.7 g/dL (ref 6.5–8.1)

## 2023-09-30 LAB — CBC WITH DIFFERENTIAL/PLATELET
Abs Immature Granulocytes: 0.19 10*3/uL — ABNORMAL HIGH (ref 0.00–0.07)
Basophils Absolute: 0.1 10*3/uL (ref 0.0–0.1)
Basophils Relative: 1 %
Eosinophils Absolute: 0.1 10*3/uL (ref 0.0–0.5)
Eosinophils Relative: 1 %
HCT: 17.5 % — ABNORMAL LOW (ref 36.0–46.0)
Hemoglobin: 6.2 g/dL — CL (ref 12.0–15.0)
Immature Granulocytes: 2 %
Lymphocytes Relative: 39 %
Lymphs Abs: 5.2 10*3/uL — ABNORMAL HIGH (ref 0.7–4.0)
MCH: 38.3 pg — ABNORMAL HIGH (ref 26.0–34.0)
MCHC: 35.4 g/dL (ref 30.0–36.0)
MCV: 108 fL — ABNORMAL HIGH (ref 80.0–100.0)
Monocytes Absolute: 2.3 10*3/uL — ABNORMAL HIGH (ref 0.1–1.0)
Monocytes Relative: 18 %
Neutro Abs: 5.1 10*3/uL (ref 1.7–7.7)
Neutrophils Relative %: 39 %
Platelets: 402 10*3/uL — ABNORMAL HIGH (ref 150–400)
RBC: 1.62 MIL/uL — ABNORMAL LOW (ref 3.87–5.11)
RDW: 25.4 % — ABNORMAL HIGH (ref 11.5–15.5)
WBC: 13 10*3/uL — ABNORMAL HIGH (ref 4.0–10.5)
nRBC: 2.9 % — ABNORMAL HIGH (ref 0.0–0.2)

## 2023-09-30 LAB — LACTATE DEHYDROGENASE: LDH: 462 U/L — ABNORMAL HIGH (ref 98–192)

## 2023-09-30 LAB — PREGNANCY, URINE: Preg Test, Ur: NEGATIVE

## 2023-09-30 MED ORDER — HEPARIN SOD (PORK) LOCK FLUSH 100 UNIT/ML IV SOLN
500.0000 [IU] | INTRAVENOUS | Status: AC | PRN
  Administered 2023-09-30: 500 [IU]
  Filled 2023-09-30: qty 5

## 2023-09-30 MED ORDER — SENNOSIDES-DOCUSATE SODIUM 8.6-50 MG PO TABS: 1.0000 | ORAL_TABLET | Freq: Two times a day (BID) | ORAL | Status: DC

## 2023-09-30 MED ORDER — ONDANSETRON HCL 4 MG/2ML IJ SOLN
4.0000 mg | Freq: Four times a day (QID) | INTRAMUSCULAR | Status: DC | PRN
  Administered 2023-09-30: 4 mg via INTRAVENOUS
  Filled 2023-09-30: qty 2

## 2023-09-30 MED ORDER — NALOXONE HCL 0.4 MG/ML IJ SOLN: 0.4000 mg | INTRAMUSCULAR | Status: DC | PRN

## 2023-09-30 MED ORDER — SODIUM CHLORIDE 0.9% FLUSH
10.0000 mL | INTRAVENOUS | Status: AC | PRN
  Administered 2023-09-30: 10 mL

## 2023-09-30 MED ORDER — SODIUM CHLORIDE 0.45 % IV SOLN
INTRAVENOUS | Status: DC
Start: 2023-09-30 — End: 2023-09-30

## 2023-09-30 MED ORDER — ACETAMINOPHEN 500 MG PO TABS: 1000.0000 mg | ORAL_TABLET | Freq: Once | ORAL | Status: DC

## 2023-09-30 MED ORDER — POLYETHYLENE GLYCOL 3350 17 G PO PACK: 17.0000 g | PACK | Freq: Every day | ORAL | Status: DC | PRN

## 2023-09-30 MED ORDER — HYDROMORPHONE 1 MG/ML IV SOLN
INTRAVENOUS | Status: DC
  Administered 2023-09-30: 30 mg via INTRAVENOUS
  Administered 2023-09-30: 11 mg via INTRAVENOUS
  Filled 2023-09-30: qty 30

## 2023-09-30 MED ORDER — DIPHENHYDRAMINE HCL 25 MG PO CAPS
25.0000 mg | ORAL_CAPSULE | ORAL | Status: DC | PRN
  Administered 2023-09-30: 25 mg via ORAL
  Filled 2023-09-30: qty 1

## 2023-09-30 MED ORDER — SODIUM CHLORIDE 0.9% FLUSH: 9.0000 mL | INTRAVENOUS | Status: DC | PRN

## 2023-09-30 NOTE — Progress Notes (Signed)
 Critical Value: Hemoglobin 6.2  Date and Time Notified:09/30/2023; 11:26 am  Provider Notified: Lynnell Chad NP  Orders received /action taken: No new orders at this time

## 2023-09-30 NOTE — Progress Notes (Signed)
 Patient admitted to the day hospital for treatment of sickle cell pain crisis. Patient reported pain rated 8/10 in the Back. Patient placed on Dilaudid PCA, given PO Benadryl, IV Zofran and hydrated with IV fluids. At discharge patient reported  pain at 6/10. Declined printed discharge instructions. Patient alert, oriented and ambulatory at discharge.

## 2023-09-30 NOTE — H&P (Signed)
 Physician Discharge Summary  Sandra Mckay ZOX:[id number] DOB: 07/22/1996 DOA: 09/30/2023  PCP: French Ana, FNP  Admit date: 09/30/2023  Discharge date: 09/30/2023  Time spent: 30 minutes  Discharge Diagnoses:  Principal Problem:   Sickle cell anemia with pain [facility])   Discharge Condition: Stable  Diet recommendation: Regular  History of present illness:   Sandra Mckay is a 28 year old female with medical history of sickle cell pain crisis who presents to the day hospital with pain bilateral hip, knee and back pain rating 10 out of 10.  Patient was admitted to the sickle cell day hospital for pain management.  She denies fever, chills, nausea, vomiting or diarrhea.   Hospital Course:  Sandra Mckay was admitted to the day hospital with sickle cell painful crisis. Patient was treated with IV fluid, weight based IV Dilaudid PCA, IV Toradol, clinician assisted doses as deemed appropriate, and other adjunct therapies per sickle cell pain management protocol. Sandra Mckay showed significant improvement symptomatically, pain improved from /10 to 7 at the time of discharge. Patient was discharged home in a hemodynamically stable condition.  At the time of discharge patient's hemoglobin 6.2.  LDH 465, WBC 13.8, reticulocyte count 1.62, patient was advised to return in 24 hours to recheck hemoglobin and possible transfusion.  Sandra Mckay will follow-up at the clinic as previously scheduled, continue with home medications as per prior to admission.  Discharge Instructions We discussed the need for good hydration, monitoring of hydration status, avoidance of heat, cold, stress, and infection triggers. We discussed the need to be compliant with taking Hydrea and other home medications. Sandra Mckay was reminded of the need to seek medical attention immediately if any symptom of bleeding, anemia, or infection occurs. Follow-up in 24 hours to reassess and possible blood transfusion.  Discharge  Exam: Vitals:   09/30/23 1526 09/30/23 1536  BP: (!) 102/52   Pulse: 65   Resp: 14 14  Temp:    SpO2: 92%     General appearance: alert, cooperative and no distress Eyes: conjunctivae/corneas clear. PERRL, EOM's intact. Fundi benign. Neck: no adenopathy, no carotid bruit, no JVD, supple, symmetrical, trachea midline and thyroid not enlarged, symmetric, no tenderness/mass/nodules Back: symmetric, no curvature. ROM normal. No CVA tenderness. Resp: clear to auscultation bilaterally Chest wall: no tenderness Cardio: regular rate and rhythm, S1, S2 normal, no murmur, click, rub or gallop GI: soft, non-tender; bowel sounds normal; no masses,  no organomegaly Extremities: extremities normal, atraumatic, no cyanosis or edema Pulses: 2+ and symmetric Skin: Skin color, texture, turgor normal. No rashes or lesions Neurologic: Grossly normal  Discharge Instructions     Diet - low sodium heart healthy   Complete by: As directed    Increase activity slowly   Complete by: As directed       Allergies as of 09/30/2023       Reactions   Latex Rash   Wound Dressing Adhesive Rash        Medication List     TAKE these medications    oxyCODONE 15 MG immediate release tablet Commonly known as: ROXICODONE Take 15 mg by mouth every 4 (four) hours as needed for pain.   oxyCODONE 30 MG 12 hr tablet Take by mouth.   QUEtiapine 100 MG tablet Commonly known as: SEROQUEL Take 100 mg by mouth at bedtime.   Vitamin D (Ergocalciferol) 1.25 MG (50000 UNIT) Caps capsule Commonly known as: DRISDOL Take 50,000 Units by mouth every 7 (seven) days.   Xarelto 20 MG Tabs tablet Generic drug:  rivaroxaban TAKE 1 TABLET BY MOUTH ONCE DAILY WITH SUPPER       Allergies  Allergen Reactions   Latex Rash   Wound Dressing Adhesive Rash     Significant Diagnostic Studies: IR IMAGING GUIDED PORT INSERTION Result Date: 09/16/2023 INDICATION: 28 year old female referred for port catheter EXAM:  IMAGE GUIDED PORT CATHETER MEDICATIONS: None ANESTHESIA/SEDATION: Moderate (conscious) sedation was employed during this procedure. A total of Versed 4.0 mg and Fentanyl 100 mcg was administered intravenously. Moderate Sedation Time: 28 minutes. The patient's level of consciousness and vital signs were monitored continuously by radiology nursing throughout the procedure under my direct supervision. FLUOROSCOPY TIME:  Fluoroscopy Time: (1.0 mGy). COMPLICATIONS: None PROCEDURE: Informed written consent was obtained from the patient after a discussion of the risks, benefits, and alternatives to treatment. Questions regarding the procedure were encouraged and answered. The right neck and chest were prepped with chlorhexidine in a sterile fashion, and a sterile drape was applied covering the operative field. Maximum barrier sterile technique with sterile gowns and gloves were used for the procedure. A timeout was performed prior to the initiation of the procedure. Ultrasound survey was performed with images stored and sent to PACs. Right IJ vein documented to be patent. The right neck and chest was prepped with chlorhexidine, and draped in the usual sterile fashion using maximum barrier technique (cap and mask, sterile gown, sterile gloves, large sterile sheet, hand hygiene and cutaneous antiseptic). Local anesthesia was attained by infiltration with 1% lidocaine without epinephrine. Ultrasound demonstrated patency of the right internal jugular vein, and this was documented with an image. Under real-time ultrasound guidance, this vein was accessed with a 21 gauge micropuncture needle and image documentation was performed. A small dermatotomy was made at the access site with an 11 scalpel. A 0.018" wire was advanced into the SVC and used to estimate the length of the internal catheter. The access needle exchanged for a 33F micropuncture vascular sheath. The 0.018" wire was then removed and a 0.035" wire advanced into the  IVC. An appropriate location for the subcutaneous reservoir was selected below the clavicle and an incision was made through the skin and underlying soft tissues. The subcutaneous tissues were then dissected using a combination of blunt and sharp surgical technique and a pocket was formed. A single lumen power injectable portacatheter was then tunneled through the subcutaneous tissues from the pocket to the dermatotomy and the port reservoir placed within the subcutaneous pocket. The venous access site was then serially dilated and a peel away vascular sheath placed over the wire. The wire was removed and the port catheter advanced into position under fluoroscopic guidance. The catheter tip is positioned in the cavoatrial junction. This was documented with a spot image. The portacatheter was then tested and found to flush and aspirate well. The port was flushed with saline followed by 100 units/mL heparinized saline. The pocket was then closed in two layers using first subdermal inverted interrupted absorbable sutures followed by a running subcuticular suture. The epidermis was then sealed with Dermabond. The dermatotomy at the venous access site was also seal with Dermabond. Steri-Strips applied. Patient tolerated the procedure well and remained hemodynamically stable throughout. No complications encountered and no significant blood loss encountered IMPRESSION: Status post right IJ port catheter placement. Signed, Yvone Neu. Miachel Roux, RPVI [facility] Electronically Signed   By: Gilmer Mor D.O.   On: 09/16/2023 14:39    Signed:  Daryll Drown NP  10/01/2023,  3:32 PM

## 2023-09-30 NOTE — Telephone Encounter (Signed)
 Patient called requesting to come to the day hospital for sickle cell pain. Patient reports lower back and bilateral leg pain rated 8/10. Reports taking Oxycontin this morning. Patient took Oxycodone last night and is now of medication. Patient can pick up Oxycodone tomorrow. COVID-19 screening done and patient denies all symptoms and exposures. Denies fever, chest pain, nausea, vomiting, diarrhea and abdominal pain. Admits to having transportation without driving self. Patient will use a taxi for transportation. Dr. Hyman Hopes and Nita Sells, NP notified. Patient can come to the day hospital for pain management. Patient advised and expresses an understanding.

## 2023-10-01 ENCOUNTER — Inpatient Hospital Stay (HOSPITAL_COMMUNITY)
Admission: EM | Admit: 2023-10-01 | Discharge: 2023-10-03 | DRG: 812 | Disposition: A | Discharge status: Home / Self Care | Payer: Medicare Other | Attending: Nurse Practitioner | Admitting: Nurse Practitioner

## 2023-10-01 ENCOUNTER — Encounter (HOSPITAL_COMMUNITY): Payer: Self-pay | Admitting: Internal Medicine

## 2023-10-01 ENCOUNTER — Other Ambulatory Visit: Payer: Self-pay

## 2023-10-01 DIAGNOSIS — I272 Pulmonary hypertension, unspecified: Secondary | ICD-10-CM | POA: Diagnosis present

## 2023-10-01 DIAGNOSIS — Z9081 Acquired absence of spleen: Secondary | ICD-10-CM

## 2023-10-01 DIAGNOSIS — E559 Vitamin D deficiency, unspecified: Secondary | ICD-10-CM | POA: Diagnosis present

## 2023-10-01 DIAGNOSIS — Z95828 Presence of other vascular implants and grafts: Secondary | ICD-10-CM

## 2023-10-01 DIAGNOSIS — F17210 Nicotine dependence, cigarettes, uncomplicated: Secondary | ICD-10-CM | POA: Diagnosis present

## 2023-10-01 DIAGNOSIS — Z823 Family history of stroke: Secondary | ICD-10-CM

## 2023-10-01 DIAGNOSIS — Z7901 Long term (current) use of anticoagulants: Secondary | ICD-10-CM

## 2023-10-01 DIAGNOSIS — Z86711 Personal history of pulmonary embolism: Secondary | ICD-10-CM | POA: Diagnosis present

## 2023-10-01 DIAGNOSIS — G894 Chronic pain syndrome: Secondary | ICD-10-CM | POA: Diagnosis present

## 2023-10-01 DIAGNOSIS — Z8249 Family history of ischemic heart disease and other diseases of the circulatory system: Secondary | ICD-10-CM | POA: Diagnosis not present

## 2023-10-01 DIAGNOSIS — E860 Dehydration: Secondary | ICD-10-CM | POA: Diagnosis present

## 2023-10-01 DIAGNOSIS — Z79899 Other long term (current) drug therapy: Secondary | ICD-10-CM

## 2023-10-01 DIAGNOSIS — Z9049 Acquired absence of other specified parts of digestive tract: Secondary | ICD-10-CM

## 2023-10-01 DIAGNOSIS — D72829 Elevated white blood cell count, unspecified: Secondary | ICD-10-CM | POA: Diagnosis present

## 2023-10-01 DIAGNOSIS — Z808 Family history of malignant neoplasm of other organs or systems: Secondary | ICD-10-CM | POA: Diagnosis not present

## 2023-10-01 DIAGNOSIS — D5700 Hb-SS disease with crisis, unspecified: Principal | ICD-10-CM | POA: Diagnosis present

## 2023-10-01 DIAGNOSIS — F1120 Opioid dependence, uncomplicated: Secondary | ICD-10-CM | POA: Diagnosis present

## 2023-10-01 DIAGNOSIS — Z720 Tobacco use: Secondary | ICD-10-CM | POA: Diagnosis present

## 2023-10-01 DIAGNOSIS — D638 Anemia in other chronic diseases classified elsewhere: Secondary | ICD-10-CM | POA: Diagnosis present

## 2023-10-01 DIAGNOSIS — Z91040 Latex allergy status: Secondary | ICD-10-CM | POA: Diagnosis not present

## 2023-10-01 DIAGNOSIS — Z833 Family history of diabetes mellitus: Secondary | ICD-10-CM

## 2023-10-01 DIAGNOSIS — R7401 Elevation of levels of liver transaminase levels: Secondary | ICD-10-CM | POA: Diagnosis present

## 2023-10-01 DIAGNOSIS — F319 Bipolar disorder, unspecified: Secondary | ICD-10-CM | POA: Diagnosis present

## 2023-10-01 LAB — CBC WITH DIFFERENTIAL/PLATELET
Abs Immature Granulocytes: 0.1 10*3/uL — ABNORMAL HIGH (ref 0.00–0.07)
Basophils Absolute: 0.1 10*3/uL (ref 0.0–0.1)
Basophils Relative: 1 %
Eosinophils Absolute: 0 10*3/uL (ref 0.0–0.5)
Eosinophils Relative: 0 %
HCT: 17.1 % — ABNORMAL LOW (ref 36.0–46.0)
Hemoglobin: 6.2 g/dL — CL (ref 12.0–15.0)
Immature Granulocytes: 1 %
Lymphocytes Relative: 35 %
Lymphs Abs: 4.8 10*3/uL — ABNORMAL HIGH (ref 0.7–4.0)
MCH: 39 pg — ABNORMAL HIGH (ref 26.0–34.0)
MCHC: 36.3 g/dL — ABNORMAL HIGH (ref 30.0–36.0)
MCV: 107.5 fL — ABNORMAL HIGH (ref 80.0–100.0)
Monocytes Absolute: 2 10*3/uL — ABNORMAL HIGH (ref 0.1–1.0)
Monocytes Relative: 15 %
Neutro Abs: 6.8 10*3/uL (ref 1.7–7.7)
Neutrophils Relative %: 48 %
Platelets: 379 10*3/uL (ref 150–400)
RBC: 1.59 MIL/uL — ABNORMAL LOW (ref 3.87–5.11)
RDW: 26.5 % — ABNORMAL HIGH (ref 11.5–15.5)
WBC: 13.8 10*3/uL — ABNORMAL HIGH (ref 4.0–10.5)
nRBC: 2.9 % — ABNORMAL HIGH (ref 0.0–0.2)

## 2023-10-01 LAB — TYPE AND SCREEN
ABO/RH(D): A POS
Antibody Screen: NEGATIVE

## 2023-10-01 LAB — HCG, SERUM, QUALITATIVE: Preg, Serum: NEGATIVE

## 2023-10-01 LAB — RETICULOCYTES
Immature Retic Fract: 34 % — ABNORMAL HIGH (ref 2.3–15.9)
RBC.: 1.62 MIL/uL — ABNORMAL LOW (ref 3.87–5.11)

## 2023-10-01 LAB — COMPREHENSIVE METABOLIC PANEL
ALT: 40 U/L (ref 0–44)
AST: 54 U/L — ABNORMAL HIGH (ref 15–41)
Albumin: 4.5 g/dL (ref 3.5–5.0)
Alkaline Phosphatase: 76 U/L (ref 38–126)
Anion gap: 9 (ref 5–15)
BUN: 8 mg/dL (ref 6–20)
CO2: 23 mmol/L (ref 22–32)
Calcium: 9.5 mg/dL (ref 8.9–10.3)
Chloride: 107 mmol/L (ref 98–111)
Creatinine, Ser: 0.34 mg/dL — ABNORMAL LOW (ref 0.44–1.00)
GFR, Estimated: >60 mL/min (ref >60)
Glucose, Bld: 99 mg/dL (ref 70–99)
Potassium: 3.9 mmol/L (ref 3.5–5.1)
Sodium: 139 mmol/L (ref 135–145)
Total Bilirubin: 2.5 mg/dL — ABNORMAL HIGH (ref 0.0–1.2)
Total Protein: 7 g/dL (ref 6.5–8.1)

## 2023-10-01 LAB — PREPARE RBC (CROSSMATCH)

## 2023-10-01 LAB — LACTATE DEHYDROGENASE: LDH: 465 U/L — ABNORMAL HIGH (ref 98–192)

## 2023-10-01 LAB — HIV ANTIBODY (ROUTINE TESTING W REFLEX): HIV Screen 4th Generation wRfx: NONREACTIVE

## 2023-10-01 MED ORDER — SODIUM CHLORIDE 0.9% FLUSH: 10.0000 mL | INTRAVENOUS | Status: DC | PRN

## 2023-10-01 MED ORDER — SODIUM CHLORIDE 0.9% FLUSH
10.0000 mL | Freq: Two times a day (BID) | INTRAVENOUS | Status: DC
  Administered 2023-10-01 (×2): 10 mL

## 2023-10-01 MED ORDER — OXYCODONE HCL ER 15 MG PO T12A
30.0000 mg | EXTENDED_RELEASE_TABLET | Freq: Two times a day (BID) | ORAL | Status: DC
  Administered 2023-10-01 – 2023-10-03 (×4): 30 mg via ORAL
  Filled 2023-10-01 (×4): qty 2

## 2023-10-01 MED ORDER — ONDANSETRON HCL 4 MG/2ML IJ SOLN
4.0000 mg | INTRAMUSCULAR | Status: DC | PRN
  Administered 2023-10-01: 4 mg via INTRAVENOUS
  Filled 2023-10-01: qty 2

## 2023-10-01 MED ORDER — OXYCODONE HCL 5 MG PO TABS: 15.0000 mg | ORAL_TABLET | ORAL | Status: DC | PRN

## 2023-10-01 MED ORDER — HYDROMORPHONE HCL 2 MG/ML IJ SOLN
2.0000 mg | INTRAMUSCULAR | Status: AC
  Administered 2023-10-01: 2 mg via INTRAVENOUS
  Filled 2023-10-01: qty 1

## 2023-10-01 MED ORDER — SODIUM CHLORIDE 0.45 % IV SOLN: INTRAVENOUS | Status: AC

## 2023-10-01 MED ORDER — SODIUM CHLORIDE 0.9% FLUSH: 9.0000 mL | INTRAVENOUS | Status: DC | PRN

## 2023-10-01 MED ORDER — ONDANSETRON HCL 4 MG/2ML IJ SOLN
4.0000 mg | Freq: Four times a day (QID) | INTRAMUSCULAR | Status: DC | PRN
  Administered 2023-10-02: 4 mg via INTRAVENOUS
  Filled 2023-10-01 (×2): qty 2

## 2023-10-01 MED ORDER — DIPHENHYDRAMINE HCL 25 MG PO CAPS: 25.0000 mg | ORAL_CAPSULE | ORAL | Status: DC | PRN

## 2023-10-01 MED ORDER — CHLORHEXIDINE GLUCONATE CLOTH 2 % EX PADS
6.0000 | MEDICATED_PAD | Freq: Every day | CUTANEOUS | Status: DC
  Administered 2023-10-02 – 2023-10-03 (×2): 6 via TOPICAL

## 2023-10-01 MED ORDER — HYDROMORPHONE 1 MG/ML IV SOLN
INTRAVENOUS | Status: DC
  Administered 2023-10-01: 12 mg via INTRAVENOUS
  Administered 2023-10-01: 30 mg via INTRAVENOUS
  Administered 2023-10-02: 3.5 mg via INTRAVENOUS
  Administered 2023-10-02: 5 mg via INTRAVENOUS
  Administered 2023-10-02: 6.5 mg via INTRAVENOUS
  Administered 2023-10-02: 3 mg via INTRAVENOUS
  Administered 2023-10-02 (×2): 30 mg via INTRAVENOUS
  Administered 2023-10-02: 8 mg via INTRAVENOUS
  Administered 2023-10-02: 6 mg via INTRAVENOUS
  Administered 2023-10-03: 7.1 mg via INTRAVENOUS
  Administered 2023-10-03: 8 mg via INTRAVENOUS
  Administered 2023-10-03: 1.5 mg via INTRAVENOUS
  Filled 2023-10-01 (×3): qty 30

## 2023-10-01 MED ORDER — QUETIAPINE FUMARATE 100 MG PO TABS: 100.0000 mg | ORAL_TABLET | Freq: Every day | ORAL | Status: DC

## 2023-10-01 MED ORDER — NALOXONE HCL 0.4 MG/ML IJ SOLN
0.4000 mg | INTRAMUSCULAR | Status: DC | PRN
Start: 2023-10-01 — End: 2023-10-03

## 2023-10-01 MED ORDER — SODIUM CHLORIDE 0.9 % IV SOLN
12.5000 mg | Freq: Once | INTRAVENOUS | Status: AC
  Administered 2023-10-01: 12.5 mg via INTRAVENOUS
  Filled 2023-10-01: qty 12.5

## 2023-10-01 MED ORDER — HYDROMORPHONE HCL 2 MG PO TABS: 2.0000 mg | ORAL_TABLET | ORAL | Status: DC | PRN

## 2023-10-01 MED ORDER — DIPHENHYDRAMINE HCL 25 MG PO CAPS
25.0000 mg | ORAL_CAPSULE | ORAL | Status: DC | PRN
  Administered 2023-10-01 – 2023-10-02 (×3): 25 mg via ORAL
  Filled 2023-10-01 (×3): qty 1

## 2023-10-01 MED ORDER — RIVAROXABAN 20 MG PO TABS
20.0000 mg | ORAL_TABLET | Freq: Every day | ORAL | Status: DC
  Administered 2023-10-02: 20 mg via ORAL
  Filled 2023-10-01: qty 1

## 2023-10-01 NOTE — H&P (Addendum)
 H&P  Patient Demographics:  Sandra Mckay, is a 28 y.o. female  MRN: [id number]   DOB - 03/05/1996  Admit Date - 10/01/2023  Outpatient Primary MD for the patient is Sandra Ana, FNP  Chief Complaint  Patient presents with   Sickle Cell Pain Crisis      HPI:   Sandra Mckay  is a 28 y.o. female with a medical history significant of sickle cell disease, chronic pain syndrome, opiate dependence and tolerance presents with complaints of generalized pain consistent with typical sickle cell pain crisis.  Patient states the pain intensity has been increasing over the past 24 hours and unrelieved by her home medications.  Patient was in the day clinic 08/30/2023 for pain management.  Patient at the time had a hemoglobin of 6.2.  After hydration and continuous PCA patient had reported pain had improved but then became worse at home and return back to the emergency department.  Patient denies fever, chills, chest pain, shortness of breath.  No urinary symptoms, nausea, vomiting or diarrhea.  No sick contacts or recent travel.  Patient attributes pain crisis to changes in weather and dehydration.   Review of systems:  In addition to the HPI above, patient reports No fever or chills No Headache, No changes with vision or hearing No problems swallowing food or liquids No chest pain, cough or shortness of breath No abdominal pain, No nausea or vomiting, Bowel movements are regular No blood in stool or urine No dysuria No new skin rashes or bruises No new joints pains-aches No new weakness, tingling, numbness in any extremity No recent weight gain or loss No polyuria, polydypsia or polyphagia No significant Mental Stressors  A full 10 point Review of Systems was done, except as stated above, all other Review of Systems were negative.  With Past History of the following :   Past Medical History:  Diagnosis Date   Acute cystitis without hematuria 10/21/2019   Blood transfusion  without reported diagnosis    Bronchitis    Chickenpox    Depression    Elevated ferritin level 05/2019   Heart murmur    Nausea without vomiting 09/09/2015   Pulmonary hypertension (HCC)    Sickle cell anemia (HCC)    Sickle cell disease, type SS (HCC)    Sickle cell pain crisis (HCC) 12/05/2016   Thrombocytosis 05/2019   Urinary tract infection    Vitamin D deficiency       Past Surgical History:  Procedure Laterality Date   CHOLECYSTECTOMY  2011   EYE SURGERY     Sty removal   IR IMAGING GUIDED PORT INSERTION  03/06/2022   IR IMAGING GUIDED PORT INSERTION  09/16/2023   IR REMOVAL TUN ACCESS W/ PORT W/O FL MOD SED  05/29/2022   LABIAL ADHESION LYSIS  1999   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   TONSILLECTOMY  2012     Social History:   Social History   Tobacco Use   Smoking status: Some Days    Current packs/day: 1.00    Types: Cigarettes   Smokeless tobacco: Never   Tobacco comments:    1 pack twice a week  Substance Use Topics   Alcohol use: Yes    Alcohol/week: 0.0 standard drinks of alcohol    Comment: occ     Lives - At home   Family History :   Family History  Problem Relation Age of Onset   Arthritis Other  grandparent   Stroke Other    Hypertension Other    Diabetes Other        grandparent   Cancer - Other Other        Glioblastoma     Home Medications:   Prior to Admission medications   Medication Sig Start Date End Date Taking? Authorizing Provider  oxyCODONE (ROXICODONE) 15 MG immediate release tablet Take 15 mg by mouth every 4 (four) hours as needed for pain.    [provider]  oxyCODONE 30 MG 12 hr tablet Take by mouth.    [provider]  QUEtiapine (SEROQUEL) 100 MG tablet Take 100 mg by mouth at bedtime.    [provider]  Vitamin D, Ergocalciferol, (DRISDOL) 1.25 MG (50000 UNIT) CAPS capsule Take 50,000 Units by mouth every 7 (seven) days.    [provider]   XARELTO 20 MG TABS tablet TAKE 1 TABLET BY MOUTH ONCE DAILY WITH SUPPER 06/30/23   Sandra Angst, MD  XTAMPZA ER 27 MG C12A Take 1 capsule by mouth 2 (two) times daily. 05/17/23   [provider]     Allergies:   Allergies  Allergen Reactions   Latex Rash   Wound Dressing Adhesive Rash     Physical Exam:   Vitals:   Vitals:   10/03/23 0935 10/03/23 1234  BP: (!) 103/56   Pulse: 71   Resp: 15 16  Temp: 98.6 F (37 C)   SpO2: 92%     Physical Exam: Constitutional: Patient appears well-developed and well-nourished. Not in obvious distress. HENT: Normocephalic, atraumatic, External right and left ear normal. Oropharynx is clear and moist.  Eyes: Conjunctivae and EOM are normal. PERRLA, no scleral icterus. Neck: Normal ROM. Neck supple. No JVD. No tracheal deviation. No thyromegaly. CVS: RRR, S1/S2 +, no murmurs, no gallops, no carotid bruit.  Pulmonary: Effort and breath sounds normal, no stridor, rhonchi, wheezes, rales.  Abdominal: Soft. BS +, no distension, tenderness, rebound or guarding.  Musculoskeletal: Normal range of motion. No edema lower extremity pain  Lymphadenopathy: No lymphadenopathy noted, cervical, inguinal or axillary Neuro: Alert. Normal reflexes, muscle tone coordination. No cranial nerve deficit. Skin: Skin is warm and dry. No rash noted. Not diaphoretic. No erythema. No pallor. Psychiatric: Normal mood and affect. Behavior, judgment, thought content normal.   Data Review:   CBC Recent Labs  Lab 10/15/23 0936  WBC 10.0  HGB 7.4*  HCT 22.0*  PLT 397  MCV 109.5*  MCH 36.8*  MCHC 33.6  RDW 22.9*  LYMPHSABS 3.8  MONOABS 1.7*  EOSABS 0.1  BASOSABS 0.1   ------------------------------------------------------------------------------------------------------------------  Chemistries  Recent Labs  Lab 10/15/23 0936  NA 138  K 3.9  CL 108  CO2 23  GLUCOSE 101*  BUN 6  CREATININE 0.37*  CALCIUM 9.0  AST 38  ALT 25   ALKPHOS 68  BILITOT 1.8*   ------------------------------------------------------------------------------------------------------------------ estimated creatinine clearance is 81.3 mL/min (A) (by C-G formula based on SCr of 0.37 mg/dL (L)). ------------------------------------------------------------------------------------------------------------------ No results for input(s): "TSH", "T4TOTAL", "T3FREE", "THYROIDAB" in the last 72 hours.  Invalid input(s): "FREET3"  Coagulation profile No results for input(s): "INR", "PROTIME" in the last 168 hours. ------------------------------------------------------------------------------------------------------------------- No results for input(s): "DDIMER" in the last 72 hours. -------------------------------------------------------------------------------------------------------------------  Cardiac Enzymes No results for input(s): "CKMB", "TROPONINI", "MYOGLOBIN" in the last 168 hours.  Invalid input(s): "CK" ------------------------------------------------------------------------------------------------------------------ No results found for: "BNP"  ---------------------------------------------------------------------------------------------------------------  Urinalysis    Component Value Date/Time   COLORURINE YELLOW 10/15/2023 1058  APPEARANCEUR CLEAR 10/15/2023 1058   LABSPEC 1.010 10/15/2023 1058   PHURINE 6.0 10/15/2023 1058   GLUCOSEU NEGATIVE 10/15/2023 1058   HGBUR NEGATIVE 10/15/2023 1058   BILIRUBINUR NEGATIVE 10/15/2023 1058   BILIRUBINUR small 11/03/2022 1223   KETONESUR NEGATIVE 10/15/2023 1058   PROTEINUR NEGATIVE 10/15/2023 1058   UROBILINOGEN 1.0 11/03/2022 1223   UROBILINOGEN >=8.0 08/04/2017 1144   NITRITE NEGATIVE 10/15/2023 1058   LEUKOCYTESUR NEGATIVE 10/15/2023 1058    ----------------------------------------------------------------------------------------------------------------   Imaging Results:     No results found.   Assessment & Plan:  Principal Problem:   Sickle cell anemia with pain (HCC) Active Problems:   Bipolar and related disorder (HCC)   Sickle cell crisis (HCC)   Leukocytosis   Tobacco abuse   History of pulmonary embolism   Hb Sickle Cell Disease with crisis: Admit patient, start IVF 0 .45% Saline @ 125 mls/hour, start weight based Dilaudid PCA, start IV Toradol 15 mg Q 6 H, Restart oral home pain medications, Monitor vitals very closely, Re-evaluate pain scale regularly, 2 L of Oxygen by [place], Patient will be re-evaluated for pain in the context of function and relationship to baseline as care progresses. Blood transfusion. Leukocytosis: Stable  Anemia of Chronic Disease: Hemoglobin within patients baseline. No current need for transfusion, will continue to monitor.  Chronic pain Syndrome: Continue home medication  DVT Prophylaxis: Subcut Lovenox   AM Labs Ordered, also please review Full Orders  Family Communication: Admission, patient's condition and plan of care including tests being ordered have been discussed with the patient who indicate understanding and agree with the plan and Code Status.  Code Status: Full Code  Consults called: None    Admission status: Inpatient    Time spent in minutes : 50 minutes  Daryll Drown NP 10/15/2023 at 3:30 PM

## 2023-10-01 NOTE — ED Triage Notes (Signed)
 Patient arrived with complaints of SSC, states yesterday was told her hemoglobin was 6 and planning to get a transfusion today but pain is worse. Pain in lower back, hips, and bilateral legs.

## 2023-10-01 NOTE — ED Notes (Signed)
 Port accessed

## 2023-10-01 NOTE — Plan of Care (Addendum)
 Patient is alert and oriented X4, all admission questions answered. Patient in 10/10 pain. PIV consult ordered for new IV for blood administration purposes. Chest port accessed in ED and PCA started for patients 10/10 generalized pain. Patient ambulatory, alert, and bed at lowest position. 1830: 1U RBC completed.  Problem: Education: Goal: Knowledge of General Education information will improve Description: Including pain rating scale, medication(s)/side effects and non-pharmacologic comfort measures Outcome: Progressing   Problem: Health Behavior/Discharge Planning: Goal: Ability to manage health-related needs will improve Outcome: Progressing   Problem: Clinical Measurements: Goal: Ability to maintain clinical measurements within normal limits will improve Outcome: Progressing Goal: Will remain free from infection Outcome: Progressing Goal: Diagnostic test results will improve Outcome: Progressing Goal: Respiratory complications will improve Outcome: Progressing Goal: Cardiovascular complication will be avoided Outcome: Progressing   Problem: Activity: Goal: Risk for activity intolerance will decrease Outcome: Progressing   Problem: Nutrition: Goal: Adequate nutrition will be maintained Outcome: Progressing   Problem: Coping: Goal: Level of anxiety will decrease Outcome: Progressing   Problem: Elimination: Goal: Will not experience complications related to bowel motility Outcome: Progressing Goal: Will not experience complications related to urinary retention Outcome: Progressing   Problem: Pain Managment: Goal: General experience of comfort will improve and/or be controlled Outcome: Progressing   Problem: Safety: Goal: Ability to remain free from injury will improve Outcome: Progressing   Problem: Skin Integrity: Goal: Risk for impaired skin integrity will decrease Outcome: Progressing   Problem: Education: Goal: Knowledge of vaso-occlusive preventative  measures will improve Outcome: Progressing Goal: Awareness of infection prevention will improve Outcome: Progressing Goal: Awareness of signs and symptoms of anemia will improve Outcome: Progressing Goal: Long-term complications will improve Outcome: Progressing   Problem: Self-Care: Goal: Ability to incorporate actions that prevent/reduce pain crisis will improve Outcome: Progressing   Problem: Bowel/Gastric: Goal: Gut motility will be maintained Outcome: Progressing   Problem: Tissue Perfusion: Goal: Complications related to inadequate tissue perfusion will be avoided or minimized Outcome: Progressing   Problem: Respiratory: Goal: Pulmonary complications will be avoided or minimized Outcome: Progressing Goal: Acute Chest Syndrome will be identified early to prevent complications Outcome: Progressing   Problem: Fluid Volume: Goal: Ability to maintain a balanced intake and output will improve Outcome: Progressing   Problem: Sensory: Goal: Pain level will decrease with appropriate interventions Outcome: Progressing   Problem: Health Behavior: Goal: Postive changes in compliance with treatment and prescription regimens will improve Outcome: Progressing

## 2023-10-01 NOTE — ED Notes (Signed)
 ED TO INPATIENT HANDOFF REPORT  ED Nurse Name and Phone #: Pennelope Bracken EMTP  S Name/Age/Gender [name] 28 y.o. female Room/Bed: WA05/WA05  Code Status   Code Status: Full Code  Home/SNF/Other Home Patient oriented to: self, place, time, and situation Is this baseline? Yes   Triage Complete: Triage complete  Chief Complaint Sickle cell anemia with pain [facility]) [D57.00]  Triage Note Patient arrived with complaints of SSC, states yesterday was told her hemoglobin was 6 and planning to get a transfusion today but pain is worse. Pain in lower back, hips, and bilateral legs.    Allergies Allergies  Allergen Reactions   Latex Rash   Wound Dressing Adhesive Rash    Level of Care/Admitting Diagnosis ED Disposition     ED Disposition  Admit   Condition  --   Comment  Hospital Area: [facility] [[id number]]  Level of Care: Med-Surg [16]  May admit patient to [facility] or [facility] if equivalent level of care is available:: No  Covid Evaluation: Asymptomatic - no recent exposure (last 10 days) testing not required  Diagnosis: Sickle cell anemia with pain [facility]) [[id number]]  Admitting Physician: [name] [[id number]]  Attending Physician: Daryll Drown [phone]  Bed request comments: 6e  Certification:: I certify this patient will need inpatient services for at least 2 midnights  Expected Medical Readiness: 10/04/2023          B Medical/Surgery History Past Medical History:  Diagnosis Date   Acute cystitis without hematuria 10/21/2019   Blood transfusion without reported diagnosis    Bronchitis    Chickenpox    Depression    Elevated ferritin level 05/2019   Heart murmur    Nausea without vomiting 09/09/2015   Pulmonary hypertension (HCC)    Sickle cell anemia (HCC)    Sickle cell disease, type SS (HCC)    Sickle cell pain crisis (HCC) 12/05/2016   Thrombocytosis 05/2019   Urinary tract infection    Vitamin D deficiency    Past  Surgical History:  Procedure Laterality Date   CHOLECYSTECTOMY  2011   EYE SURGERY     Sty removal   IR IMAGING GUIDED PORT INSERTION  03/06/2022   IR IMAGING GUIDED PORT INSERTION  09/16/2023   IR REMOVAL TUN ACCESS W/ PORT W/O FL MOD SED  05/29/2022   LABIAL ADHESION LYSIS  1999   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   TONSILLECTOMY  2012     A IV Location/Drains/Wounds Patient Lines/Drains/Airways Status     Active Line/Drains/Airways     Name Placement date Placement time Site Days   Implanted Port 09/16/23 Right Chest Single 09/16/23  1412  Chest  15            Intake/Output Last 24 hours No intake or output data in the 24 hours ending 10/01/23 1339  Labs/Imaging Results for orders placed or performed during the hospital encounter of 10/01/23 (from the past 48 hours)  Comprehensive metabolic panel     Status: Abnormal   Collection Time: 10/01/23  6:29 AM  Result Value Ref Range   Sodium 139 135 - 145 mmol/L   Potassium 3.9 3.5 - 5.1 mmol/L   Chloride 107 98 - 111 mmol/L   CO2 23 22 - 32 mmol/L   Glucose, Bld 99 70 - 99 mg/dL    Comment: Glucose reference range applies only to samples taken after fasting for at least 8  hours.   BUN 8 6 - 20 mg/dL   Creatinine, Ser 2.95 (L) 0.44 - 1.00 mg/dL   Calcium 9.5 8.9 - 62.1 mg/dL   Total Protein 7.0 6.5 - 8.1 g/dL   Albumin 4.5 3.5 - 5.0 g/dL   AST 54 (H) 15 - 41 U/L   ALT 40 0 - 44 U/L   Alkaline Phosphatase 76 38 - 126 U/L   Total Bilirubin 2.5 (H) 0.0 - 1.2 mg/dL   GFR, Estimated >30 >86 mL/min    Comment: (NOTE) Calculated using the CKD-EPI Creatinine Equation (2021)    Anion gap 9 5 - 15    Comment: Performed at [facility], [address] [place], [place] [address]  hCG, serum, qualitative     Status: None   Collection Time: 10/01/23  6:29 AM  Result Value Ref Range   Preg, Serum NEGATIVE NEGATIVE    Comment:        THE SENSITIVITY OF THIS METHODOLOGY  IS >10 mIU/mL. Performed at [facility], [address] [place], [place] [address]   Type and screen [facility]     Status: None   Collection Time: 10/01/23  7:03 AM  Result Value Ref Range   ABO/RH(D) A POS    Antibody Screen NEG    Sample Expiration      10/04/2023,2359 Performed at [facility], [address], [place], [place] [address]   CBC with Differential/Platelet     Status: Abnormal   Collection Time: 10/01/23  7:40 AM  Result Value Ref Range   WBC 13.8 (H) 4.0 - 10.5 K/uL   RBC 1.59 (L) 3.87 - 5.11 MIL/uL   Hemoglobin 6.2 (LL) 12.0 - 15.0 g/dL    Comment: REPEATED TO VERIFY  THIS IS A RECOLLECTED SPECIMEN THIS CRITICAL RESULT HAS VERIFIED AND BEEN CALLED TO [place], L RN BY SARA SHAEFFER ON 02 28 2025 AT 0822, AND HAS BEEN READ BACK.     HCT 17.1 (L) 36.0 - 46.0 %   MCV 107.5 (H) 80.0 - 100.0 fL   MCH 39.0 (H) 26.0 - 34.0 pg   MCHC 36.3 (H) 30.0 - 36.0 g/dL   RDW 24.4 (H) 01.0 - 27.2 %   Platelets 379 150 - 400 K/uL   nRBC 2.9 (H) 0.0 - 0.2 %   Neutrophils Relative % 48 %   Neutro Abs 6.8 1.7 - 7.7 K/uL   Lymphocytes Relative 35 %   Lymphs Abs 4.8 (H) 0.7 - 4.0 K/uL   Monocytes Relative 15 %   Monocytes Absolute 2.0 (H) 0.1 - 1.0 K/uL   Eosinophils Relative 0 %   Eosinophils Absolute 0.0 0.0 - 0.5 K/uL   Basophils Relative 1 %   Basophils Absolute 0.1 0.0 - 0.1 K/uL   WBC Morphology MORPHOLOGY UNREMARKABLE    Smear Review PLATELET COUNT CONFIRMED BY SMEAR     Comment: Reviewed   Immature Granulocytes 1 %   Abs Immature Granulocytes 0.10 (H) 0.00 - 0.07 K/uL   Pappenheimer Bodies PRESENT    Carollee Massed Bodies PRESENT    Polychromasia PRESENT    Sickle Cells PRESENT    Target Cells PRESENT     Comment: Performed at [facility], [address], [place], [place] [address]  Reticulocytes     Status: Abnormal   Collection Time: 10/01/23  7:40 AM  Result Value Ref Range   Retic  Ct Pct RESULTS UNAVAILABLE DUE TO INTERFERING  SUBSTANCE 0.4 - 3.1 %   RBC. 1.62 (L) 3.87 - 5.11 MIL/uL   Retic Count, Absolute NCAL 19.0 - 186.0 K/uL   Immature Retic Fract 34.0 (H) 2.3 - 15.9 %    Comment: Performed at [facility], [address], [place], [place] [address]   No results found.  Pending Labs [employer] (From admission, onward)     Start     Ordered   10/01/23 0834  Prepare RBC (crossmatch)  (Blood Administration Adult)  Once,   R       Question Answer Comment  # of Units 1 unit   Transfusion Indications Hemoglobin < 7 gm/dL and symptomatic   Number of Units to Keep Ahead NO units ahead   If emergent release call blood bank Not emergent release      10/01/23 0843   10/01/23 0538  CBC with Differential  Once,   STAT        10/01/23 0537   Signed and Held  Lactate dehydrogenase  Once,   R        Signed and Held   Signed and Held  HIV Antibody (routine testing w rflx)  (HIV Antibody (Routine testing w reflex) panel)  Once,   R        Signed and Held   Signed and Held  CBC  Daily,   R      Signed and Held            Vitals/Pain Today's Vitals   10/01/23 0905 10/01/23 0912 10/01/23 0915 10/01/23 1254  BP: 112/66  110/67 110/67  Pulse: 72   72  Resp:    18  Temp:    98.2 F (36.8 C)  TempSrc:    Oral  SpO2: 93%   94%  Weight:      Height:      PainSc:  8       Isolation Precautions No active isolations  Medications Medications  ondansetron (ZOFRAN) injection 4 mg (4 mg Intravenous Given 10/01/23 0709)  HYDROmorphone (DILAUDID) tablet 2 mg (has no administration in time range)  HYDROmorphone (DILAUDID) injection 2 mg (2 mg Intravenous Given 10/01/23 0709)  HYDROmorphone (DILAUDID) injection 2 mg (2 mg Intravenous Given 10/01/23 0833)  HYDROmorphone (DILAUDID) injection 2 mg (2 mg Intravenous Given 10/01/23 0912)  diphenhydrAMINE (BENADRYL) 12.5 mg in sodium chloride 0.9 % 50 mL IVPB (0 mg Intravenous Stopped 10/01/23 0910)     Mobility walks     Focused Assessments See Chart   R Recommendations: See Admitting Provider Note  Report given to:

## 2023-10-01 NOTE — ED Provider Notes (Signed)
 [place] EMERGENCY DEPARTMENT AT [facility] Provider Note   CSN: [id number] Arrival date & time: 10/01/23  0450     History  Chief Complaint  Patient presents with   Sickle Cell Pain Crisis    Sandra Mckay is a 28 y.o. female with sickle cell presents complaining of sickle cell crisis.  She states yesterday she started having pain " all over".  She is primarily complaining of pain in her hips and lumbar.  States the pain feels very typical of her sickle cell pain crisis.  Denies any new injury or trauma.  No radicular symptoms no vomiting or diarrhea.  No chest pain or shortness of breath.  She states she was due for a blood transfusion today at the sickle cell clinic.  States her hemoglobin is 6.  Denies any hematochezia.  Does not feel lightheaded or dizzy.   Sickle Cell Pain Crisis      Home Medications Prior to Admission medications   Medication Sig Start Date End Date Taking? Authorizing Provider  oxyCODONE (ROXICODONE) 15 MG immediate release tablet Take 15 mg by mouth every 4 (four) hours as needed for pain.    [provider]  oxyCODONE 30 MG 12 hr tablet Take by mouth.    [provider]  QUEtiapine (SEROQUEL) 100 MG tablet Take 100 mg by mouth at bedtime.    [provider]  Vitamin D, Ergocalciferol, (DRISDOL) 1.25 MG (50000 UNIT) CAPS capsule Take 50,000 Units by mouth every 7 (seven) days.    [provider]  XARELTO 20 MG TABS tablet TAKE 1 TABLET BY MOUTH ONCE DAILY WITH SUPPER 06/30/23   [name], MD  XTAMPZA ER 27 MG C12A Take 1 capsule by mouth 2 (two) times daily. 05/17/23   [provider]      Allergies    Latex and Wound dressing adhesive    Review of Systems   Review of Systems  Physical Exam Updated Vital Signs BP 110/67   Pulse 72   Temp 98.9 F (37.2 C) (Oral)   Resp 18   Ht 5\' 2"  (1.575 m)   Wt 59 kg   LMP 09/27/2023 (Approximate)   SpO2 93%   BMI 23.78 kg/m   Physical Exam Vitals and nursing note reviewed.  Constitutional:      General: She is not in acute distress.    Appearance: She is well-developed.  HENT:     Head: Normocephalic and atraumatic.  Eyes:     Conjunctiva/sclera: Conjunctivae normal.  Cardiovascular:     Rate and Rhythm: Normal rate and regular rhythm.     Heart sounds: No murmur heard. Pulmonary:     Effort: Pulmonary effort is normal. No respiratory distress.     Breath sounds: Normal breath sounds. No wheezing or rales.  Abdominal:     Palpations: Abdomen is soft.     Tenderness: There is no abdominal tenderness. There is no guarding or rebound.     Comments: No appreciable splenomegaly  Musculoskeletal:     Cervical back: Neck supple.     Comments: Patient has tenderness to lumbar spine, no step-offs or gross deformities, no overlying erythema, warmth, induration or fluctuance.  She additionally has no overlying tenderness to bilateral hips without gross deformities.  She tolerates full range of motion of lower extremities with full lower extremity strength.  NVI  Skin:    General: Skin is warm and dry.     Capillary Refill: Capillary refill takes less  than 2 seconds.  Neurological:     General: No focal deficit present.     Mental Status: She is alert and oriented to person, place, and time.     Motor: No weakness.  Psychiatric:        Mood and Affect: Mood normal.     ED Results / Procedures / Treatments   Labs (all labs ordered are listed, but only abnormal results are displayed) Labs Reviewed  COMPREHENSIVE METABOLIC PANEL - Abnormal; Notable for the following components:      Result Value   Creatinine, Ser 0.34 (*)    AST 54 (*)    Total Bilirubin 2.5 (*)    All other components within normal limits  CBC WITH DIFFERENTIAL/PLATELET - Abnormal; Notable for the following components:   WBC 13.8 (*)    RBC 1.59 (*)    Hemoglobin 6.2 (*)    HCT 17.1 (*)    MCV 107.5 (*)    MCH 39.0 (*)    MCHC 36.3  (*)    RDW 26.5 (*)    nRBC 2.9 (*)    Lymphs Abs 4.8 (*)    Monocytes Absolute 2.0 (*)    Abs Immature Granulocytes 0.10 (*)    All other components within normal limits  RETICULOCYTES - Abnormal; Notable for the following components:   RBC. 1.62 (*)    Immature Retic Fract 34.0 (*)    All other components within normal limits  HCG, SERUM, QUALITATIVE  CBC WITH DIFFERENTIAL/PLATELET  TYPE AND SCREEN  PREPARE RBC (CROSSMATCH)    EKG None  Radiology No results found.  Procedures Procedures    Medications Ordered in ED Medications  ondansetron (ZOFRAN) injection 4 mg (4 mg Intravenous Given 10/01/23 0709)  HYDROmorphone (DILAUDID) injection 2 mg (2 mg Intravenous Given 10/01/23 0709)  HYDROmorphone (DILAUDID) injection 2 mg (2 mg Intravenous Given 10/01/23 0833)  HYDROmorphone (DILAUDID) injection 2 mg (2 mg Intravenous Given 10/01/23 0912)  diphenhydrAMINE (BENADRYL) 12.5 mg in sodium chloride 0.9 % 50 mL IVPB (12.5 mg Intravenous New Bag/Given 10/01/23 1610)    ED Course/ Medical Decision Making/ A&P                                 Medical Decision Making Amount and/or Complexity of Data Reviewed Labs: ordered.   This patient presents to the ED with chief complaint(s) of sickle cell crisis.  The complaint involves an extensive differential diagnosis and also carries with it a high risk of complications and morbidity.   pertinent past medical history as listed in HPI  The differential diagnosis includes  Sickle cell pain crisis, acute chest syndrome, spinal abscess, discitis, fracture, dislocation, GI bleed, aplastic crisis, sequestrum gracious The initial plan is to  Obtain basic labs Additional history obtained:  Records reviewed previous admission documents and Care Everywhere/External Records  Initial Assessment:   Hemodynamically stable, afebrile, nontoxic-appearing patient presenting complaining of acute sickle cell crisis.  On exam she has generalized  tenderness to the lumbar spine including midline without gross deformities as well as to bilateral hips.  Pain is in typical locations.  Do not feel that repeat imaging is indicated.  She has no neurodeficits on exam she has good strength and is able to ambulate.  Reports hemoglobin of 6.  Denies any hematochezia.  Appears that recent blood work she had a hemoglobin of 6.2 yesterday, last month she had a hemoglobin of 7.3 followed  by 6.8.   Independent ECG interpretation:  none  Independent labs interpretation:  The following labs were independently interpreted:  CMP with mildly elevated AST to 54 and elevated total bili to 2.5, CBC with leukocytosis of 13.8 and hemoglobin of 6.2, reticulocytes in the 1.6   Independent visualization and interpretation of imaging: none  Treatment and Reassessment: Patient given 3 doses of Dilaudid 2 mg in addition to Benadryl 12.5 mg and Zofran 4 mg patient reports improvement of symptoms.  Still painful. CBC returned notably low at 6.2, will begin with transfusion 1 unit of blood  Consultations obtained:   Sickle cell team Dr. Lynnell Chad agreed for admission   Disposition:   Patient will be admitted for sickle cell pain crisis and anemia  Social Determinants of Health:   none  This note was dictated with voice recognition software.  Despite best efforts at proofreading, errors may have occurred which can change the documentation meaning.          Final Clinical Impression(s) / ED Diagnoses Final diagnoses:  Sickle cell crisis [facility])    Rx / DC Orders ED Discharge Orders     None         Halford Decamp, PA-C 10/01/23 1042    Gilda Crease, MD 10/02/23 [phone]

## 2023-10-01 NOTE — Progress Notes (Signed)
   10/01/23 1430  TOC Brief Assessment  Insurance and Status Reviewed  Patient has primary care physician Yes ([facility]  Home environment has been reviewed From Home alone  Prior level of function: Independent  Prior/Current Home Services No current home services  Social Drivers of Health Review SDOH reviewed no interventions necessary  Readmission risk has been reviewed Yes  Transition of care needs no transition of care needs at this time

## 2023-10-02 DIAGNOSIS — D5700 Hb-SS disease with crisis, unspecified: Secondary | ICD-10-CM

## 2023-10-02 LAB — CBC
HCT: 19.9 % — ABNORMAL LOW (ref 36.0–46.0)
Hemoglobin: 6.9 g/dL — CL (ref 12.0–15.0)
MCH: 36.5 pg — ABNORMAL HIGH (ref 26.0–34.0)
MCHC: 34.7 g/dL (ref 30.0–36.0)
MCV: 105.3 fL — ABNORMAL HIGH (ref 80.0–100.0)
Platelets: 354 10*3/uL (ref 150–400)
RBC: 1.89 MIL/uL — ABNORMAL LOW (ref 3.87–5.11)
RDW: 27.9 % — ABNORMAL HIGH (ref 11.5–15.5)
WBC: 9.9 10*3/uL (ref 4.0–10.5)
nRBC: 3.5 % — ABNORMAL HIGH (ref 0.0–0.2)

## 2023-10-02 NOTE — Progress Notes (Signed)
 Patient ID: Sandra Mckay, female   DOB: November 28, 1995, 28 y.o.   MRN: [id number] Subjective: Sandra Mckay is a 28 year old female with a medical history significant for sickle cell disease, chronic pain syndrome, opiate dependence and tolerance, and anemia of chronic disease was admitted for symptomatic anemia in the setting of sickle cell pain crisis.   Today patient feels much better, although she is still in the little bit of pain especially in her lower back and lower extremities.  Fatigue is much improved.  She has no new symptoms today.  She denies any cough, chest pain, shortness of breath, nausea, vomiting or diarrhea.  No urinary symptoms.  Objective:  Vital signs in last 24 hours:  Vitals:   10/02/23 0900 10/02/23 1300 10/02/23 1314 10/02/23 1523  BP:   119/80   Pulse:   69   Resp: 15 16 16 14   Temp:   98.8 F (37.1 C)   TempSrc:   Oral   SpO2: 94%  94%   Weight:      Height:        Intake/Output from previous day:   Intake/Output Summary (Last 24 hours) at 10/02/2023 1705 Last data filed at 10/02/2023 1002 Gross per 24 hour  Intake 1610.65 ml  Output --  Net 1610.65 ml    Physical Exam: General: Alert, awake, oriented x3, in no acute distress.  HEENT: [place]/AT PEERL, EOMI Neck: Trachea midline,  no masses, no thyromegal,y no JVD, no carotid bruit OROPHARYNX:  Moist, No exudate/ erythema/lesions.  Heart: Regular rate and rhythm, without murmurs, rubs, gallops, PMI non-displaced, no heaves or thrills on palpation.  Lungs: Clear to auscultation, no wheezing or rhonchi noted. No increased vocal fremitus resonant to percussion  Abdomen: Soft, nontender, nondistended, positive bowel sounds, no masses no hepatosplenomegaly noted..  Neuro: No focal neurological deficits noted cranial nerves II through XII grossly intact. DTRs 2+ bilaterally upper and lower extremities. Strength 5 out of 5 in bilateral upper and lower extremities. Musculoskeletal: No warm swelling or erythema  around joints, no spinal tenderness noted. Psychiatric: Patient alert and oriented x3, good insight and cognition, good recent to remote recall. Lymph node survey: No cervical axillary or inguinal lymphadenopathy noted.  Lab Results:  Basic Metabolic Panel:    Component Value Date/Time   NA 139 10/01/2023 0629   NA 140 02/02/2023 1219   K 3.9 10/01/2023 0629   CL 107 10/01/2023 0629   CO2 23 10/01/2023 0629   BUN 8 10/01/2023 0629   BUN 7 02/02/2023 1219   CREATININE 0.34 (L) 10/01/2023 0629   CREATININE 0.53 05/31/2017 1145   GLUCOSE 99 10/01/2023 0629   CALCIUM 9.5 10/01/2023 0629   CBC:    Component Value Date/Time   WBC 9.9 10/02/2023 0434   HGB 6.9 (LL) 10/02/2023 0434   HGB 8.4 (L) 02/02/2023 1219   HCT 19.9 (L) 10/02/2023 0434   HCT 24.9 (L) 02/02/2023 1219   PLT 354 10/02/2023 0434   PLT 492 (H) 02/02/2023 1219   MCV 105.3 (H) 10/02/2023 0434   MCV 106 (H) 02/02/2023 1219   NEUTROABS 6.8 10/01/2023 0740   NEUTROABS 5.9 02/02/2023 1219   LYMPHSABS 4.8 (H) 10/01/2023 0740   LYMPHSABS 3.6 (H) 02/02/2023 1219   MONOABS 2.0 (H) 10/01/2023 0740   EOSABS 0.0 10/01/2023 0740   EOSABS 0.1 02/02/2023 1219   BASOSABS 0.1 10/01/2023 0740   BASOSABS 0.1 02/02/2023 1219    No results found for this or any previous visit (from the past 240  hours).  Studies/Results: No results found.  Medications: Scheduled Meds:  Chlorhexidine Gluconate Cloth  6 each Topical Daily   HYDROmorphone   Intravenous Q4H   oxyCODONE  30 mg Oral Q12H   rivaroxaban  20 mg Oral Q supper   Continuous Infusions: PRN Meds:.diphenhydrAMINE, HYDROmorphone, naloxone **AND** sodium chloride flush, ondansetron (ZOFRAN) IV, oxyCODONE  Consultants: None  Procedures: None  Antibiotics: None  Assessment/Plan: Principal Problem:   Sickle cell anemia with pain (HCC) Active Problems:   Bipolar and related disorder (HCC)   Leukocytosis   Tobacco abuse   History of pulmonary embolism  Hb  Sickle Cell Disease with Pain crisis: Reduce IV fluid to KVO, continue weight based Dilaudid PCA at current dose setting, start IV Toradol 15 mg Q 6 H, continue oral home pain medications as ordered.  Monitor vitals very closely, Re-evaluate pain scale regularly, 2 L of Oxygen by [place]. Anemia of Chronic Disease: Hemoglobin is 6.9 today, status post transfusion of 1 unit of packed red blood cell.  Patient is asymptomatic.  Will repeat lab in AM. Chronic pain Syndrome: Continue home pain medications as ordered. History of pulmonary embolism: Continue Xarelto. Bipolar disorder: Clinically stable.  Patient denies any suicidal ideations or thoughts.  Will continue her home medications.  Code Status: Full Code Family Communication: N/A Disposition Plan: Not yet ready for discharge  [name]  If 7PM-7AM, please contact night-coverage.  10/02/2023, 5:05 PM  LOS: 1 day

## 2023-10-02 NOTE — Plan of Care (Signed)

## 2023-10-02 NOTE — Progress Notes (Signed)
 No acute changes this shift, patient pain managed with PCA pump with eff results, in bed resting, call light in reach

## 2023-10-03 DIAGNOSIS — D5700 Hb-SS disease with crisis, unspecified: Secondary | ICD-10-CM | POA: Diagnosis not present

## 2023-10-03 LAB — BASIC METABOLIC PANEL
Anion gap: 6 (ref 5–15)
BUN: 9 mg/dL (ref 6–20)
CO2: 25 mmol/L (ref 22–32)
Calcium: 9 mg/dL (ref 8.9–10.3)
Chloride: 105 mmol/L (ref 98–111)
Creatinine, Ser: 0.31 mg/dL — ABNORMAL LOW (ref 0.44–1.00)
GFR, Estimated: >60 mL/min (ref >60)
Glucose, Bld: 79 mg/dL (ref 70–99)
Potassium: 4 mmol/L (ref 3.5–5.1)
Sodium: 136 mmol/L (ref 135–145)

## 2023-10-03 LAB — CBC
HCT: 20.3 % — ABNORMAL LOW (ref 36.0–46.0)
Hemoglobin: 7.1 g/dL — ABNORMAL LOW (ref 12.0–15.0)
MCH: 36.6 pg — ABNORMAL HIGH (ref 26.0–34.0)
MCHC: 35 g/dL (ref 30.0–36.0)
MCV: 104.6 fL — ABNORMAL HIGH (ref 80.0–100.0)
Platelets: 384 10*3/uL (ref 150–400)
RBC: 1.94 MIL/uL — ABNORMAL LOW (ref 3.87–5.11)
RDW: 25.6 % — ABNORMAL HIGH (ref 11.5–15.5)
WBC: 8.8 10*3/uL (ref 4.0–10.5)
nRBC: 3.2 % — ABNORMAL HIGH (ref 0.0–0.2)

## 2023-10-03 MED ORDER — HEPARIN SOD (PORK) LOCK FLUSH 100 UNIT/ML IV SOLN
500.0000 [IU] | Freq: Once | INTRAVENOUS | Status: AC
  Administered 2023-10-03: 500 [IU] via INTRAVENOUS
  Filled 2023-10-03: qty 5

## 2023-10-03 NOTE — Discharge Summary (Signed)
 Physician Discharge Summary  Sandra Mckay MVH:[id number] DOB: Aug 30, 1995 DOA: 10/01/2023  PCP: French Ana, FNP  Admit date: 10/01/2023  Discharge date: 10/03/2023  Discharge Diagnoses:  Principal Problem:   Sickle cell anemia with pain (HCC) Active Problems:   Bipolar and related disorder (HCC)   Leukocytosis   Tobacco abuse   History of pulmonary embolism   Discharge Condition: Stable  Disposition:   Follow-up Information     French Ana, FNP. Schedule an appointment as soon as possible for a visit in 1 week(s).   Specialty: Family Medicine Contact information: [address] [place] [address] [phone]                Pt is discharged home in good condition and is to follow up with French Ana, FNP this week to have labs evaluated. Sandra Mckay is instructed to increase activity slowly and balance with rest for the next few days, and use prescribed medication to complete treatment of pain  Diet: Regular Wt Readings from Last 3 Encounters:  10/01/23 59 kg  09/16/23 59 kg  06/13/23 59 kg    History of present illness:  Sandra Mckay  is a 28 y.o. female with a medical history significant of sickle cell disease, chronic pain syndrome, opiate dependence and tolerance presents with complaints of generalized pain consistent with typical sickle cell pain crisis.  Patient states the pain intensity has been increasing over the past 24 hours and unrelieved by her home medications.  Patient was in the day clinic 08/30/2023 for pain management.  Patient at the time had a hemoglobin of 6.2.  After hydration and continuous PCA patient had reported pain had improved but then became worse at home and return back to the emergency department.  Patient denies fever, chills, chest pain, shortness of breath.  No urinary symptoms, nausea, vomiting or diarrhea.  No sick contacts or recent travel.  Patient attributes pain crisis to changes in weather  and dehydration.   Hospital Course:  Patient was admitted for symptomatic anemia in the setting of sickle cell pain crisis and managed appropriately with IVF, IV Dilaudid via PCA and IV Toradol, as well as other adjunct therapies per sickle cell pain management protocols.  Patient received 1 unit of packed red blood cell transfusion with significant improvement in her hemoglobin to 7.1 which is consistent with her baseline hemoglobin.  Patient did well on this regimen pain quickly returned to baseline.  Patient was tolerating p.o. intake with no significant restriction and ambulating well with no significant pain.  As at today, patient is doing well with her pain, she requested to be discharged home saying she can now manage her pain at home. Patient was therefore discharged home today in a hemodynamically stable condition.   Sandra Mckay will follow-up with PCP within 1 week of this discharge. Sandra Mckay was counseled extensively about nonpharmacologic means of pain management, patient verbalized understanding and was appreciative of  the care received during this admission.   We discussed the need for good hydration, monitoring of hydration status, avoidance of heat, cold, stress, and infection triggers. We discussed the need to be adherent with taking Hydrea and other home medications. Patient was reminded of the need to seek medical attention immediately if any symptom of bleeding, anemia, or infection occurs.  Discharge Exam: Vitals:   10/03/23 0740 10/03/23 0935  BP:  (!) 103/56  Pulse:  71  Resp: 16 15  Temp:  98.6 F (37 C)  SpO2:  92%  Vitals:   10/02/23 2352 10/03/23 0500 10/03/23 0740 10/03/23 0935  BP: 111/71   (!) 103/56  Pulse: 74   71  Resp: 14 14 16 15   Temp: 98.4 F (36.9 C)   98.6 F (37 C)  TempSrc: Oral   Oral  SpO2: 93% 94%  92%  Weight:      Height:       General appearance : Awake, alert, not in any distress. Speech Clear. Not toxic looking HEENT: Atraumatic and  Normocephalic, pupils equally reactive to light and accomodation Neck: Supple, no JVD. No cervical lymphadenopathy.  Chest: Good air entry bilaterally, no added sounds  CVS: S1 S2 regular, no murmurs.  Abdomen: Bowel sounds present, Non tender and not distended with no gaurding, rigidity or rebound. Extremities: B/L Lower Ext shows no edema, both legs are warm to touch Neurology: Awake alert, and oriented X 3, CN II-XII intact, Non focal Skin: No Rash  Discharge Instructions  Discharge Instructions     Diet - low sodium heart healthy   Complete by: As directed    Increase activity slowly   Complete by: As directed       Allergies as of 10/03/2023       Reactions   Latex Rash   Wound Dressing Adhesive Rash        Medication List     TAKE these medications    hydroxyurea 500 MG capsule Commonly known as: HYDREA Take 500 mg by mouth 3 (three) times daily.   oxyCODONE 15 MG immediate release tablet Commonly known as: ROXICODONE Take 15 mg by mouth every 4 (four) hours as needed for pain.   oxyCODONE 30 MG 12 hr tablet Take by mouth.   QUEtiapine 100 MG tablet Commonly known as: SEROQUEL Take 100 mg by mouth at bedtime.   Vitamin D (Ergocalciferol) 1.25 MG (50000 UNIT) Caps capsule Commonly known as: DRISDOL Take 50,000 Units by mouth every 7 (seven) days.   Xarelto 20 MG Tabs tablet Generic drug: rivaroxaban TAKE 1 TABLET BY MOUTH ONCE DAILY WITH SUPPER        The results of significant diagnostics from this hospitalization (including imaging, microbiology, ancillary and laboratory) are listed below for reference.    Significant Diagnostic Studies: IR IMAGING GUIDED PORT INSERTION Result Date: 09/16/2023 INDICATION: 28 year old female referred for port catheter EXAM: IMAGE GUIDED PORT CATHETER MEDICATIONS: None ANESTHESIA/SEDATION: Moderate (conscious) sedation was employed during this procedure. A total of Versed 4.0 mg and Fentanyl 100 mcg was administered  intravenously. Moderate Sedation Time: 28 minutes. The patient's level of consciousness and vital signs were monitored continuously by radiology nursing throughout the procedure under my direct supervision. FLUOROSCOPY TIME:  Fluoroscopy Time: (1.0 mGy). COMPLICATIONS: None PROCEDURE: Informed written consent was obtained from the patient after a discussion of the risks, benefits, and alternatives to treatment. Questions regarding the procedure were encouraged and answered. The right neck and chest were prepped with chlorhexidine in a sterile fashion, and a sterile drape was applied covering the operative field. Maximum barrier sterile technique with sterile gowns and gloves were used for the procedure. A timeout was performed prior to the initiation of the procedure. Ultrasound survey was performed with images stored and sent to PACs. Right IJ vein documented to be patent. The right neck and chest was prepped with chlorhexidine, and draped in the usual sterile fashion using maximum barrier technique (cap and mask, sterile gown, sterile gloves, large sterile sheet, hand hygiene and cutaneous antiseptic). Local anesthesia was attained by  infiltration with 1% lidocaine without epinephrine. Ultrasound demonstrated patency of the right internal jugular vein, and this was documented with an image. Under real-time ultrasound guidance, this vein was accessed with a 21 gauge micropuncture needle and image documentation was performed. A small dermatotomy was made at the access site with an 11 scalpel. A 0.018" wire was advanced into the SVC and used to estimate the length of the internal catheter. The access needle exchanged for a 60F micropuncture vascular sheath. The 0.018" wire was then removed and a 0.035" wire advanced into the IVC. An appropriate location for the subcutaneous reservoir was selected below the clavicle and an incision was made through the skin and underlying soft tissues. The subcutaneous tissues were  then dissected using a combination of blunt and sharp surgical technique and a pocket was formed. A single lumen power injectable portacatheter was then tunneled through the subcutaneous tissues from the pocket to the dermatotomy and the port reservoir placed within the subcutaneous pocket. The venous access site was then serially dilated and a peel away vascular sheath placed over the wire. The wire was removed and the port catheter advanced into position under fluoroscopic guidance. The catheter tip is positioned in the cavoatrial junction. This was documented with a spot image. The portacatheter was then tested and found to flush and aspirate well. The port was flushed with saline followed by 100 units/mL heparinized saline. The pocket was then closed in two layers using first subdermal inverted interrupted absorbable sutures followed by a running subcuticular suture. The epidermis was then sealed with Dermabond. The dermatotomy at the venous access site was also seal with Dermabond. Steri-Strips applied. Patient tolerated the procedure well and remained hemodynamically stable throughout. No complications encountered and no significant blood loss encountered IMPRESSION: Status post right IJ port catheter placement. Signed, Yvone Neu. Miachel Roux, RPVI [facility] Electronically Signed   By: Gilmer Mor D.O.   On: 09/16/2023 14:39    Microbiology: No results found for this or any previous visit (from the past 240 hours).   Labs: Basic Metabolic Panel: Recent Labs  Lab 09/30/23 1040 10/01/23 0629 10/03/23 0531  NA 134* 139 136  K 3.8 3.9 4.0  CL 103 107 105  CO2 22 23 25   GLUCOSE 90 99 79  BUN 8 8 9   CREATININE 0.36* 0.34* 0.31*  CALCIUM 8.8* 9.5 9.0   Liver Function Tests: Recent Labs  Lab 09/30/23 1040 10/01/23 0629  AST 53* 54*  ALT 37 40  ALKPHOS 70 76  BILITOT 2.8* 2.5*  PROT 6.7 7.0  ALBUMIN 4.2 4.5   No results  for input(s): "LIPASE", "AMYLASE" in the last 168 hours. No results for input(s): "AMMONIA" in the last 168 hours. CBC: Recent Labs  Lab 09/30/23 1040 10/01/23 0740 10/02/23 0434 10/03/23 0531  WBC 13.0* 13.8* 9.9 8.8  NEUTROABS 5.1 6.8  --   --   HGB 6.2* 6.2* 6.9* 7.1*  HCT 17.5* 17.1* 19.9* 20.3*  MCV 108.0* 107.5* 105.3* 104.6*  PLT 402* 379 354 384   Cardiac Enzymes: No results for input(s): "CKTOTAL", "CKMB", "CKMBINDEX", "TROPONINI" in the last 168 hours. BNP: Invalid input(s): "POCBNP" CBG: No results for input(s): "GLUCAP" in the last 168 hours.  Time coordinating discharge: 50 minutes  Signed:  John Williamsen  [facility] 10/03/2023, 12:29 PM

## 2023-10-03 NOTE — Progress Notes (Signed)
 Patient is being discharged home. All discharge instructions reviewed with patient. All discharge instructions reviewed and patient verbalized a full understanding. Patients mother is her ride home.

## 2023-10-04 LAB — TYPE AND SCREEN
ABO/RH(D): A POS
Antibody Screen: NEGATIVE
Donor AG Type: NEGATIVE
Unit division: 0

## 2023-10-04 LAB — BPAM RBC
Blood Product Expiration Date: 202503182359
ISSUE DATE / TIME: 202502281549
Unit Type and Rh: 6200

## 2023-10-15 ENCOUNTER — Non-Acute Institutional Stay (HOSPITAL_COMMUNITY)
Admission: AD | Admit: 2023-10-15 | Discharge: 2023-10-15 | Disposition: A | Discharge status: Home / Self Care | Source: Ambulatory Visit | Attending: Internal Medicine | Admitting: Internal Medicine

## 2023-10-15 ENCOUNTER — Telehealth (HOSPITAL_COMMUNITY): Payer: Self-pay

## 2023-10-15 DIAGNOSIS — D5700 Hb-SS disease with crisis, unspecified: Secondary | ICD-10-CM | POA: Diagnosis present

## 2023-10-15 DIAGNOSIS — G894 Chronic pain syndrome: Secondary | ICD-10-CM | POA: Diagnosis not present

## 2023-10-15 DIAGNOSIS — F1120 Opioid dependence, uncomplicated: Secondary | ICD-10-CM | POA: Diagnosis not present

## 2023-10-15 LAB — RETICULOCYTES
Immature Retic Fract: 26.7 % — ABNORMAL HIGH (ref 2.3–15.9)
RBC.: 1.98 MIL/uL — ABNORMAL LOW (ref 3.87–5.11)
Retic Count, Absolute: 526 10*3/uL — ABNORMAL HIGH (ref 19.0–186.0)
Retic Ct Pct: 26.3 % — ABNORMAL HIGH (ref 0.4–3.1)

## 2023-10-15 LAB — COMPREHENSIVE METABOLIC PANEL
ALT: 25 U/L (ref 0–44)
AST: 38 U/L (ref 15–41)
Albumin: 4.2 g/dL (ref 3.5–5.0)
Alkaline Phosphatase: 68 U/L (ref 38–126)
Anion gap: 7 (ref 5–15)
BUN: 6 mg/dL (ref 6–20)
CO2: 23 mmol/L (ref 22–32)
Calcium: 9 mg/dL (ref 8.9–10.3)
Chloride: 108 mmol/L (ref 98–111)
Creatinine, Ser: 0.37 mg/dL — ABNORMAL LOW (ref 0.44–1.00)
GFR, Estimated: >60 mL/min (ref >60)
Glucose, Bld: 101 mg/dL — ABNORMAL HIGH (ref 70–99)
Potassium: 3.9 mmol/L (ref 3.5–5.1)
Sodium: 138 mmol/L (ref 135–145)
Total Bilirubin: 1.8 mg/dL — ABNORMAL HIGH (ref 0.0–1.2)
Total Protein: 6.6 g/dL (ref 6.5–8.1)

## 2023-10-15 LAB — CBC WITH DIFFERENTIAL/PLATELET
Abs Immature Granulocytes: 0.05 10*3/uL (ref 0.00–0.07)
Basophils Absolute: 0.1 10*3/uL (ref 0.0–0.1)
Basophils Relative: 1 %
Eosinophils Absolute: 0.1 10*3/uL (ref 0.0–0.5)
Eosinophils Relative: 1 %
HCT: 22 % — ABNORMAL LOW (ref 36.0–46.0)
Hemoglobin: 7.4 g/dL — ABNORMAL LOW (ref 12.0–15.0)
Immature Granulocytes: 1 %
Lymphocytes Relative: 38 %
Lymphs Abs: 3.8 10*3/uL (ref 0.7–4.0)
MCH: 36.8 pg — ABNORMAL HIGH (ref 26.0–34.0)
MCHC: 33.6 g/dL (ref 30.0–36.0)
MCV: 109.5 fL — ABNORMAL HIGH (ref 80.0–100.0)
Monocytes Absolute: 1.7 10*3/uL — ABNORMAL HIGH (ref 0.1–1.0)
Monocytes Relative: 17 %
Neutro Abs: 4.3 10*3/uL (ref 1.7–7.7)
Neutrophils Relative %: 42 %
Platelets: 397 10*3/uL (ref 150–400)
RBC: 2.01 MIL/uL — ABNORMAL LOW (ref 3.87–5.11)
RDW: 22.9 % — ABNORMAL HIGH (ref 11.5–15.5)
WBC: 10 10*3/uL (ref 4.0–10.5)
nRBC: 1.4 % — ABNORMAL HIGH (ref 0.0–0.2)

## 2023-10-15 LAB — URINALYSIS, ROUTINE W REFLEX MICROSCOPIC
Bilirubin Urine: NEGATIVE
Glucose, UA: NEGATIVE mg/dL
Hgb urine dipstick: NEGATIVE
Ketones, ur: NEGATIVE mg/dL
Leukocytes,Ua: NEGATIVE
Nitrite: NEGATIVE
Protein, ur: NEGATIVE mg/dL
Specific Gravity, Urine: 1.01 (ref 1.005–1.030)
pH: 6 (ref 5.0–8.0)

## 2023-10-15 LAB — LACTATE DEHYDROGENASE: LDH: 386 U/L — ABNORMAL HIGH (ref 98–192)

## 2023-10-15 MED ORDER — SODIUM CHLORIDE 0.9% FLUSH
10.0000 mL | INTRAVENOUS | Status: AC | PRN
  Administered 2023-10-15: 10 mL

## 2023-10-15 MED ORDER — SENNOSIDES-DOCUSATE SODIUM 8.6-50 MG PO TABS
1.0000 | ORAL_TABLET | Freq: Two times a day (BID) | ORAL | Status: DC
  Administered 2023-10-15: 1 via ORAL
  Filled 2023-10-15: qty 1

## 2023-10-15 MED ORDER — HYDROMORPHONE 1 MG/ML IV SOLN
INTRAVENOUS | Status: DC
  Administered 2023-10-15: 30 mg via INTRAVENOUS
  Administered 2023-10-15: 13.5 mg via INTRAVENOUS
  Filled 2023-10-15: qty 30

## 2023-10-15 MED ORDER — ONDANSETRON HCL 4 MG/2ML IJ SOLN
4.0000 mg | INTRAMUSCULAR | Status: DC | PRN
  Administered 2023-10-15: 4 mg via INTRAVENOUS
  Filled 2023-10-15: qty 2

## 2023-10-15 MED ORDER — ONDANSETRON HCL 4 MG/2ML IJ SOLN: 4.0000 mg | Freq: Four times a day (QID) | INTRAMUSCULAR | Status: DC | PRN

## 2023-10-15 MED ORDER — ONDANSETRON HCL 4 MG PO TABS: 4.0000 mg | ORAL_TABLET | ORAL | Status: DC | PRN

## 2023-10-15 MED ORDER — SODIUM CHLORIDE 0.9% FLUSH: 9.0000 mL | INTRAVENOUS | Status: DC | PRN

## 2023-10-15 MED ORDER — POLYETHYLENE GLYCOL 3350 17 G PO PACK: 17.0000 g | PACK | Freq: Every day | ORAL | Status: DC | PRN

## 2023-10-15 MED ORDER — OXYCODONE HCL ER 10 MG PO T12A
30.0000 mg | EXTENDED_RELEASE_TABLET | Freq: Two times a day (BID) | ORAL | Status: AC
  Administered 2023-10-15: 30 mg via ORAL
  Filled 2023-10-15: qty 3

## 2023-10-15 MED ORDER — SODIUM CHLORIDE 0.45 % IV SOLN: INTRAVENOUS | Status: DC

## 2023-10-15 MED ORDER — NALOXONE HCL 0.4 MG/ML IJ SOLN: 0.4000 mg | INTRAMUSCULAR | Status: DC | PRN

## 2023-10-15 MED ORDER — DIPHENHYDRAMINE HCL 25 MG PO CAPS
25.0000 mg | ORAL_CAPSULE | ORAL | Status: DC | PRN
  Administered 2023-10-15: 25 mg via ORAL
  Filled 2023-10-15: qty 1

## 2023-10-15 MED ORDER — ACETAMINOPHEN 500 MG PO TABS
1000.0000 mg | ORAL_TABLET | Freq: Once | ORAL | Status: AC
  Administered 2023-10-15: 1000 mg via ORAL
  Filled 2023-10-15: qty 2

## 2023-10-15 MED ORDER — HEPARIN SOD (PORK) LOCK FLUSH 100 UNIT/ML IV SOLN
500.0000 [IU] | INTRAVENOUS | Status: AC | PRN
  Administered 2023-10-15: 500 [IU]
  Filled 2023-10-15: qty 5

## 2023-10-15 NOTE — Discharge Summary (Cosign Needed Addendum)
 Physician Discharge Summary  Sandra Mckay:[id number] DOB: July 31, 1996 DOA: 10/15/2023  PCP: French Ana, FNP  Admit date: 10/15/2023  Discharge date: 10/15/2023  Time spent: 30 minutes  Discharge Diagnoses:  Principal Problem:   Sickle-cell disease with pain [facility])   Discharge Condition: Stable  Diet recommendation: Regular  History of present illness:  Sandra Mckay is a 28 y.o. female with history of sickle cell disease, chronic pain syndrome, opiate dependence and tolerance presents with complaints of generalized pain consistent with typical sickle cell pain crisis. Patient states the pain intensity has been increasing over the past 24 hours and unrelieved by her home medications. Patient was in the day clinic 10/01/2023 for pain management. Patient at the time had a hemoglobin of 6.2. After hydration and continuous PCA, patient was discharged home. She presents today with pain 8/10 in her legs, hips and lower back. . Patient denies fever, chills, chest pain, shortness of breath. No urinary symptoms, nausea, vomiting or diarrhea. No sick contacts or recent travel. Patient attributes pain crisis to changes in weather and dehydration.     Hospital Course:  Juliza Machnik was admitted to the day hospital with sickle cell painful crisis. Patient was treated with IV fluid, weight based IV Dilaudid PCA, IV Toradol, clinician assisted doses as deemed appropriate, and other adjunct therapies per sickle cell pain management protocol. Makeda showed significant improvement symptomatically, pain improved from 8/10 to 6/10 at the time of discharge. Patient was discharged home in a hemodynamically stable condition. Zunairah will follow-up at the clinic as previously scheduled, continue with home medications as per prior to admission.  Discharge Instructions We discussed the need for good hydration, monitoring of hydration status, avoidance of heat, cold, stress, and infection triggers.  We discussed the need to be compliant with taking Hydrea and other home medications. Rocio was reminded of the need to seek medical attention immediately if any symptom of bleeding, anemia, or infection occurs.  Discharge Exam: Vitals:   10/15/23 1516 10/15/23 1537  BP: 110/74   Pulse: 72   Resp: 14 13  Temp:    SpO2: 94%     General appearance: alert, cooperative and no distress Eyes: conjunctivae/corneas clear. PERRL, EOM's intact. Fundi benign. Neck: no adenopathy, no carotid bruit, no JVD, supple, symmetrical, trachea midline and thyroid not enlarged, symmetric, no tenderness/mass/nodules Back: symmetric, no curvature. ROM normal. No CVA tenderness. Resp: clear to auscultation bilaterally Chest wall: no tenderness Cardio: regular rate and rhythm, S1, S2 normal, no murmur, click, rub or gallop GI: soft, non-tender; bowel sounds normal; no masses,  no organomegaly Extremities: extremities normal, atraumatic, no cyanosis or edema Pulses: 2+ and symmetric Skin: Skin color, texture, turgor normal. No rashes or lesions Neurologic: Grossly normal  Discharge Instructions     Diet - low sodium heart healthy   Complete by: As directed    Increase activity slowly   Complete by: As directed       Allergies as of 10/15/2023       Reactions   Latex Rash   Wound Dressing Adhesive Rash        Medication List     TAKE these medications    hydroxyurea 500 MG capsule Commonly known as: HYDREA Take 500 mg by mouth 3 (three) times daily.   oxyCODONE 15 MG immediate release tablet Commonly known as: ROXICODONE Take 15 mg by mouth every 4 (four) hours as needed for pain.   oxyCODONE 30 MG 12 hr tablet Take by mouth.   QUEtiapine  100 MG tablet Commonly known as: SEROQUEL Take 100 mg by mouth at bedtime.   Vitamin D (Ergocalciferol) 1.25 MG (50000 UNIT) Caps capsule Commonly known as: DRISDOL Take 50,000 Units by mouth every 7 (seven) days.   Xarelto 20 MG Tabs  tablet Generic drug: rivaroxaban TAKE 1 TABLET BY MOUTH ONCE DAILY WITH SUPPER       Allergies  Allergen Reactions   Latex Rash   Wound Dressing Adhesive Rash     Significant Diagnostic Studies: No results found.   Signed:  Daryll Drown NP  10/17/2023, 3:43 PM

## 2023-10-15 NOTE — Progress Notes (Addendum)
 Pt admitted to the day hospital today for sickle cell pain treatment. On arrival, pt reports 8/10 pain to bilateral hips, legs, and back. Pt received Dilaudid PCA, IV Zofran, and hydrated with IV fluids via PAC. Pt also received PO Tylenol, Benadryl, and OXYCONTIN 30 mg. At discharge, pt rates pain 6/10. PAC de-accessed and flushed with 0.9% Sodium Chloride and Heparin, site is covered with a band-aid. AVS offered, but pt declined. Pt is alert, oriented, and ambulatory at discharge.

## 2023-10-15 NOTE — H&P (Signed)
 [facility] History and Physical  Sandra Mckay YQM:[id number] DOB: November 12, 1995 DOA: 10/15/2023  PCP: French Ana, FNP   Chief Complaint:  Chief Complaint  Patient presents with   Sickle Cell Pain Crisis    HPI: Sandra Mckay is a 28 y.o. female with history of sickle cell disease, chronic pain syndrome, opiate dependence and tolerance presents with complaints of generalized pain consistent with typical sickle cell pain crisis. Patient states the pain intensity has been increasing over the past 24 hours and unrelieved by her home medications. Patient was in the day clinic 10/01/2023 for pain management. Patient at the time had a hemoglobin of 6.2. After hydration and continuous PCA, patient was discharged home. She presents today with pain 8/10 in her legs, hips and lower back. . Patient denies fever, chills, chest pain, shortness of breath. No urinary symptoms, nausea, vomiting or diarrhea. No sick contacts or recent travel. Patient attributes pain crisis to changes in weather and dehydration.   Systemic Review: General: The patient denies anorexia, fever, weight loss Cardiac: Denies chest pain, syncope, palpitations, pedal edema  Respiratory: Denies cough, shortness of breath, wheezing GI: Denies severe indigestion/heartburn, abdominal pain, nausea, vomiting, diarrhea and constipation GU: Denies hematuria, incontinence, dysuria  Musculoskeletal: Denies arthritis  Skin: Denies suspicious skin lesions Neurologic: Denies focal weakness or numbness, change in vision  Past Medical History:  Diagnosis Date   Acute cystitis without hematuria 10/21/2019   Blood transfusion without reported diagnosis    Bronchitis    Chickenpox    Depression    Elevated ferritin level 05/2019   Heart murmur    Nausea without vomiting 09/09/2015   Pulmonary hypertension (HCC)    Sickle cell anemia (HCC)    Sickle cell disease, type SS (HCC)    Sickle cell pain crisis (HCC)  12/05/2016   Thrombocytosis 05/2019   Urinary tract infection    Vitamin D deficiency     Past Surgical History:  Procedure Laterality Date   CHOLECYSTECTOMY  2011   EYE SURGERY     Sty removal   IR IMAGING GUIDED PORT INSERTION  03/06/2022   IR IMAGING GUIDED PORT INSERTION  09/16/2023   IR REMOVAL TUN ACCESS W/ PORT W/O FL MOD SED  05/29/2022   LABIAL ADHESION LYSIS  1999   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 Authorizing Provider  hydroxyurea (HYDREA) 500 MG capsule Take 500 mg by mouth 3 (three) times daily.    [provider]  oxyCODONE (ROXICODONE) 15 MG immediate release tablet Take 15 mg by mouth every 4 (four) hours as needed for pain.    [provider]  oxyCODONE 30 MG 12 hr tablet Take by mouth.    [provider]  QUEtiapine (SEROQUEL) 100 MG tablet Take 100 mg by mouth at bedtime.    [provider]  Vitamin D, Ergocalciferol, (DRISDOL) 1.25 MG (50000 UNIT)  CAPS capsule Take 50,000 Units by mouth every 7 (seven) days. Patient not taking: Reported on 10/01/2023    [provider]  XARELTO 20 MG TABS tablet TAKE 1 TABLET BY MOUTH ONCE DAILY WITH SUPPER 06/30/23   [name], MD     Physical Exam: Vitals:   10/15/23 0924 10/15/23 1006  BP: 99/62   Pulse: 83   Resp: 16 16  Temp: 99 F (37.2 C)   TempSrc: Temporal   SpO2: 96%     General: Alert, awake, afebrile, anicteric, not in obvious distress HEENT: Normocephalic and Atraumatic, Mucous membranes pink                PERRLA; EOM intact;  No scleral icterus,                 Nares: Patent, Oropharynx: Clear, Fair Dentition                 Neck: FROM, no cervical lymphadenopathy, thyromegaly, carotid bruit or JVD;  CHEST WALL: No tenderness  CHEST: Normal respiration, clear to auscultation bilaterally  HEART: Regular rate and rhythm; no murmurs rubs or gallops  BACK: No kyphosis or scoliosis; no CVA tenderness  ABDOMEN: Positive Bowel Sounds, soft, non-tender; no masses, no organomegaly EXTREMITIES: No cyanosis, clubbing, or edema SKIN:  no rash or ulceration  CNS: Alert and Oriented x 4, Nonfocal exam, CN 2-12 intact  Labs on Admission:  Basic Metabolic Panel: No results for input(s): "NA", "K", "CL", "CO2", "GLUCOSE", "BUN", "CREATININE", "CALCIUM", "MG", "PHOS" in the last 168 hours. Liver Function Tests: No results for input(s): "AST", "ALT", "ALKPHOS", "BILITOT", "PROT", "ALBUMIN" in the last 168 hours. No results for input(s): "LIPASE", "AMYLASE" in the last 168 hours. No results for input(s): "AMMONIA" in the last 168 hours. CBC: No results for input(s): "WBC", "NEUTROABS", "HGB", "HCT", "MCV", "PLT" in the last 168 hours. Cardiac Enzymes: No results for input(s): "CKTOTAL", "CKMB", "CKMBINDEX", "TROPONINI" in the last 168 hours.  BNP (last 3 results) No results for input(s): "BNP" in the last 8760 hours.  ProBNP (last 3 results) No results for input(s): "PROBNP" in the last 8760 hours.  CBG: No results for input(s): "GLUCAP" in the last 168 hours.   Assessment/Plan Principal Problem:   Sickle-cell disease with pain (HCC)  Admits to the Day Hospital for extended observation IVF 0.45% Saline @ 100 mls/hour Weight based Dilaudid PCA started within 30 minutes of admission IV Toradol not given due to medication allergies.  Acetaminophen 1000 mg x 1 dose Labs: CBCD, CMP, Retic Count and LDH, Urinalysis Monitor vitals very closely, Re-evaluate pain scale every hour 2 L of Oxygen by [place] Patient will be  re-evaluated for pain in the context of function and relationship to baseline as care progresses. If no significant relieve from pain (remains above 5/10) will transfer patient to inpatient services for further evaluation and management  Code Status: Full  Family Communication: None  DVT Prophylaxis: Ambulate as tolerated   Time spent: 35 Minutes  Daryll Drown NP  If 7PM-7AM, please contact night-coverage [email or web address] 10/15/2023, 10:27 AM

## 2023-10-15 NOTE — Telephone Encounter (Signed)
 Pt called the day hospital wanting to come in today for sickle cell pain treatment. Pt reports 8/10 pain to legs, hips, and lower back. Pt reports taking Oxycodone 15 mg at 5 AM. Pt denies being to the ER recently. Patient screened for COVID and denies symptoms and exposures. Pt denies fever, chest pain, N/V/D. Pt states that she is having some abdominal "soreness" says it may be her bladder but says it is hard to describe. Onyeje, NP notified and per provider pt can come to the day hospital today. Pt notified and verbalized understanding. Pt states that she has transportation without driving herself today.

## 2023-10-19 NOTE — Discharge Summary (Signed)
 Physician Discharge Summary  Tashera Montalvo HYQ:[id number] DOB: 1995-10-22 DOA: 09/30/2023  PCP: French Ana, FNP  Admit date: 09/30/2023  Discharge date: 09/30/2023  Time spent: 30 minutes  Discharge Diagnoses:  Principal Problem:   Sickle cell anemia with pain [facility])   Discharge Condition: Stable  Diet recommendation: Regular  History of present illness:  Sandra Mckay was admitted to the day hospital with sickle cell painful crisis. Patient was treated with IV fluid, weight based IV Dilaudid PCA, IV Toradol, clinician assisted doses as deemed appropriate, and other adjunct therapies per sickle cell pain management protocol. Janique showed significant improvement symptomatically, pain improved from /10 to 7 at the time of discharge. Patient was discharged home in a hemodynamically stable condition.  At the time of discharge patient's hemoglobin 6.2.  LDH 465, WBC 13.8, reticulocyte count 1.62, patient was advised to return in 24 hours to recheck hemoglobin and possible transfusion.  Esma will follow-up at the clinic as previously scheduled, continue with home medications as per prior to admission.   Hospital Course:  Tyriana Helmkamp was admitted to the day hospital with sickle cell painful crisis. Patient was treated with IV fluid, weight based IV Dilaudid PCA, IV Toradol, clinician assisted doses as deemed appropriate, and other adjunct therapies per sickle cell pain management protocol. Devann showed significant improvement symptomatically, pain improved from 9/10 to 5/10 at the time of discharge. Patient was discharged home in a hemodynamically stable condition. Hila will follow-up at the clinic as previously scheduled, continue with home medications as per prior to admission.  Discharge Instructions We discussed the need for good hydration, monitoring of hydration status, avoidance of heat, cold, stress, and infection triggers. We discussed the need to be compliant  with taking Hydrea and other home medications. Emersen was reminded of the need to seek medical attention immediately if any symptom of bleeding, anemia, or infection occurs.  Discharge Exam: Vitals:   09/30/23 1526 09/30/23 1536  BP: (!) 102/52   Pulse: 65   Resp: 14 14  Temp:    SpO2: 92%     General appearance: alert, cooperative and no distress Eyes: conjunctivae/corneas clear. PERRL, EOM's intact. Fundi benign. Neck: no adenopathy, no carotid bruit, no JVD, supple, symmetrical, trachea midline and thyroid not enlarged, symmetric, no tenderness/mass/nodules Back: symmetric, no curvature. ROM normal. No CVA tenderness. Resp: clear to auscultation bilaterally Chest wall: no tenderness Cardio: regular rate and rhythm, S1, S2 normal, no murmur, click, rub or gallop GI: soft, non-tender; bowel sounds normal; no masses,  no organomegaly Extremities: extremities normal, atraumatic, no cyanosis or edema Pulses: 2+ and symmetric Skin: Skin color, texture, turgor normal. No rashes or lesions Neurologic: Grossly normal  Discharge Instructions     Diet - low sodium heart healthy   Complete by: As directed    Increase activity slowly   Complete by: As directed       Allergies as of 09/30/2023       Reactions   Latex Rash   Wound Dressing Adhesive Rash        Medication List     TAKE these medications    oxyCODONE 15 MG immediate release tablet Commonly known as: ROXICODONE Take 15 mg by mouth every 4 (four) hours as needed for pain.   oxyCODONE 30 MG 12 hr tablet Take by mouth.   QUEtiapine 100 MG tablet Commonly known as: SEROQUEL Take 100 mg by mouth at bedtime.   Vitamin D (Ergocalciferol) 1.25 MG (50000 UNIT) Caps capsule Commonly known as:  DRISDOL Take 50,000 Units by mouth every 7 (seven) days.   Xarelto 20 MG Tabs tablet Generic drug: rivaroxaban TAKE 1 TABLET BY MOUTH ONCE DAILY WITH SUPPER       Allergies  Allergen Reactions   Latex Rash   Wound  Dressing Adhesive Rash     Significant Diagnostic Studies: No results found.  Signed:  Daryll Drown NP  10/19/2023, 4:32 PM

## 2023-10-28 ENCOUNTER — Non-Acute Institutional Stay (HOSPITAL_COMMUNITY)
Admission: AD | Admit: 2023-10-28 | Discharge: 2023-10-28 | Disposition: A | Discharge status: Home / Self Care | Source: Ambulatory Visit | Attending: Internal Medicine | Admitting: Internal Medicine

## 2023-10-28 ENCOUNTER — Other Ambulatory Visit: Payer: Self-pay | Admitting: Nurse Practitioner

## 2023-10-28 ENCOUNTER — Telehealth (HOSPITAL_COMMUNITY): Payer: Self-pay

## 2023-10-28 DIAGNOSIS — D5700 Hb-SS disease with crisis, unspecified: Secondary | ICD-10-CM

## 2023-10-28 DIAGNOSIS — F1120 Opioid dependence, uncomplicated: Secondary | ICD-10-CM | POA: Diagnosis not present

## 2023-10-28 DIAGNOSIS — G894 Chronic pain syndrome: Secondary | ICD-10-CM | POA: Diagnosis not present

## 2023-10-28 LAB — RETICULOCYTES
Immature Retic Fract: 30 % — ABNORMAL HIGH (ref 2.3–15.9)
RBC.: 1.85 MIL/uL — ABNORMAL LOW (ref 3.87–5.11)
Retic Count, Absolute: 828 10*3/uL — ABNORMAL HIGH (ref 19.0–186.0)
Retic Ct Pct: 26.7 % — ABNORMAL HIGH (ref 0.4–3.1)

## 2023-10-28 LAB — CBC WITH DIFFERENTIAL/PLATELET
Abs Immature Granulocytes: 0.05 10*3/uL (ref 0.00–0.07)
Basophils Absolute: 0.1 10*3/uL (ref 0.0–0.1)
Basophils Relative: 1 %
Eosinophils Absolute: 0.1 10*3/uL (ref 0.0–0.5)
Eosinophils Relative: 1 %
HCT: 20.4 % — ABNORMAL LOW (ref 36.0–46.0)
Hemoglobin: 6.9 g/dL — CL (ref 12.0–15.0)
Immature Granulocytes: 1 %
Lymphocytes Relative: 35 %
Lymphs Abs: 2.3 10*3/uL (ref 0.7–4.0)
MCH: 37.1 pg — ABNORMAL HIGH (ref 26.0–34.0)
MCHC: 33.8 g/dL (ref 30.0–36.0)
MCV: 109.7 fL — ABNORMAL HIGH (ref 80.0–100.0)
Monocytes Absolute: 1.3 10*3/uL — ABNORMAL HIGH (ref 0.1–1.0)
Monocytes Relative: 20 %
Neutro Abs: 2.7 10*3/uL (ref 1.7–7.7)
Neutrophils Relative %: 42 %
Platelets: 406 10*3/uL — ABNORMAL HIGH (ref 150–400)
RBC: 1.86 MIL/uL — ABNORMAL LOW (ref 3.87–5.11)
RDW: 22.2 % — ABNORMAL HIGH (ref 11.5–15.5)
WBC: 6.5 10*3/uL (ref 4.0–10.5)
nRBC: 2.1 % — ABNORMAL HIGH (ref 0.0–0.2)

## 2023-10-28 LAB — COMPREHENSIVE METABOLIC PANEL WITH GFR
ALT: 22 U/L (ref 0–44)
AST: 38 U/L (ref 15–41)
Albumin: 4.4 g/dL (ref 3.5–5.0)
Alkaline Phosphatase: 68 U/L (ref 38–126)
Anion gap: 7 (ref 5–15)
BUN: 7 mg/dL (ref 6–20)
CO2: 22 mmol/L (ref 22–32)
Calcium: 9 mg/dL (ref 8.9–10.3)
Chloride: 110 mmol/L (ref 98–111)
Creatinine, Ser: 0.36 mg/dL — ABNORMAL LOW (ref 0.44–1.00)
GFR, Estimated: >60 mL/min (ref >60)
Glucose, Bld: 114 mg/dL — ABNORMAL HIGH (ref 70–99)
Potassium: 3.7 mmol/L (ref 3.5–5.1)
Sodium: 139 mmol/L (ref 135–145)
Total Bilirubin: 1.7 mg/dL — ABNORMAL HIGH (ref 0.0–1.2)
Total Protein: 6.7 g/dL (ref 6.5–8.1)

## 2023-10-28 LAB — LACTATE DEHYDROGENASE: LDH: 363 U/L — ABNORMAL HIGH (ref 98–192)

## 2023-10-28 MED ORDER — SENNOSIDES-DOCUSATE SODIUM 8.6-50 MG PO TABS
1.0000 | ORAL_TABLET | Freq: Two times a day (BID) | ORAL | Status: DC
  Filled 2023-10-28: qty 1

## 2023-10-28 MED ORDER — POLYETHYLENE GLYCOL 3350 17 G PO PACK: 17.0000 g | PACK | Freq: Every day | ORAL | Status: DC | PRN

## 2023-10-28 MED ORDER — DIPHENHYDRAMINE HCL 25 MG PO CAPS: 25.0000 mg | ORAL_CAPSULE | ORAL | Status: DC | PRN

## 2023-10-28 MED ORDER — ONDANSETRON HCL 4 MG/2ML IJ SOLN
4.0000 mg | Freq: Four times a day (QID) | INTRAMUSCULAR | Status: DC | PRN
  Filled 2023-10-28: qty 2

## 2023-10-28 MED ORDER — HYDROMORPHONE 1 MG/ML IV SOLN
INTRAVENOUS | Status: DC
  Administered 2023-10-28: 14 mg via INTRAVENOUS
  Administered 2023-10-28: 30 mg via INTRAVENOUS
  Filled 2023-10-28: qty 30

## 2023-10-28 MED ORDER — HEPARIN SOD (PORK) LOCK FLUSH 100 UNIT/ML IV SOLN
500.0000 [IU] | INTRAVENOUS | Status: AC | PRN
  Administered 2023-10-28: 500 [IU]
  Filled 2023-10-28: qty 5

## 2023-10-28 MED ORDER — ONDANSETRON HCL 4 MG/2ML IJ SOLN: 4.0000 mg | INTRAMUSCULAR | Status: DC | PRN

## 2023-10-28 MED ORDER — SODIUM CHLORIDE 0.45 % IV SOLN: INTRAVENOUS | Status: DC

## 2023-10-28 MED ORDER — SODIUM CHLORIDE 0.9% FLUSH
10.0000 mL | INTRAVENOUS | Status: AC | PRN
  Administered 2023-10-28: 10 mL

## 2023-10-28 MED ORDER — ONDANSETRON HCL 4 MG PO TABS: 4.0000 mg | ORAL_TABLET | ORAL | Status: DC | PRN

## 2023-10-28 MED ORDER — ACETAMINOPHEN 500 MG PO TABS
1000.0000 mg | ORAL_TABLET | Freq: Once | ORAL | Status: AC
  Administered 2023-10-28: 1000 mg via ORAL
  Filled 2023-10-28: qty 2

## 2023-10-28 MED ORDER — DIPHENHYDRAMINE HCL 25 MG PO CAPS
25.0000 mg | ORAL_CAPSULE | ORAL | Status: DC | PRN
  Administered 2023-10-28: 25 mg via ORAL
  Filled 2023-10-28: qty 1

## 2023-10-28 MED ORDER — SODIUM CHLORIDE 0.9% FLUSH: 9.0000 mL | INTRAVENOUS | Status: DC | PRN

## 2023-10-28 MED ORDER — ONDANSETRON HCL 4 MG PO TABS: 4.0000 mg | ORAL_TABLET | Freq: Three times a day (TID) | ORAL | 0 refills | Status: AC | PRN

## 2023-10-28 MED ORDER — NALOXONE HCL 0.4 MG/ML IJ SOLN: 0.4000 mg | INTRAMUSCULAR | Status: DC | PRN

## 2023-10-28 NOTE — Progress Notes (Signed)
 Pt admitted to the day hospital today for sickle cell pain treatment. On arrival, pt reports 8/10 pain to bilateral legs and back. Pt received Dilaudid PCA, IV Zofran, and IV fluids via PAC. Pt also received PO Tylenol and Benadryl. At discharge, pt rates pain 6/10. PAC de-accessed and flushed with 0.9% Sodium Chloride and Heparin, site is covered with a band-aid. AVS offered, but pt declined. Pt is alert, oriented, and ambulatory at discharge.

## 2023-10-28 NOTE — Progress Notes (Unsigned)
Zofran refill sent to pharmacy

## 2023-10-28 NOTE — Telephone Encounter (Signed)
 Pt called day hospital wanting to come in today for sickle cell pain treatment. Pt reports 8/10 pain to bilateral legs and back. Patient reports taking oxycodone 15 mg  and Oxycontin 30 mg at 1 AM. Pt denies being to the ER recently. Pt screened for COVID. Per pt she may have been exposed to COVID last weekend, pt states she does not have any symptoms. Pt denies fever, chest pain, N/V/D, abdominal pain. Onyeje, NP notified and per provider pt can come to the day hospital today. Per pt her mom will be her transportation today.

## 2023-10-28 NOTE — H&P (Signed)
 [facility] History and Physical  Sandra Mckay WUJ:[id number] DOB: [birth year] DOA: 10/28/2023  PCP: French Ana, FNP   Chief Complaint:  Chief Complaint  Patient presents with   Sickle Cell Pain Crisis    HPI: Sandra Mckay is a 28 y.o. female with history of sickle cell disease chronic pain syndrome, opiate dependence and tolerance presents with complaints of generalized pain consistent with typical sickle cell pain crisis. Patient states the pain intensity has been increasing over the past 24 hours and unrelieved by her home medications. Patient was in the day clinic 10/15/2023 for pain management. Patient at the time had a hemoglobin of 7.4. After hydration and continuous PCA, patient was discharged home. She presents today with pain 9/10 in her legs, hips and lower back. . Patient denies fever, chills, chest pain, shortness of breath. No urinary symptoms, nausea, vomiting or diarrhea. No sick contacts or recent travel. Patient attributes pain crisis to changes in weather and dehydration.   Systemic Review: General: The patient denies anorexia, fever, weight loss Cardiac: Denies chest pain, syncope, palpitations, pedal edema  Respiratory: Denies cough, shortness of breath, wheezing GI: Denies severe indigestion/heartburn, abdominal pain, nausea, vomiting, diarrhea and constipation GU: Denies hematuria, incontinence, dysuria  Musculoskeletal: bilateral hip and bilateral extremity pain  Skin: Denies suspicious skin lesions Neurologic: Denies focal weakness or numbness, change in vision  Past Medical History:  Diagnosis Date   Acute cystitis without hematuria 10/21/2019   Blood transfusion without reported diagnosis    Bronchitis    Chickenpox    Depression    Elevated ferritin level 05/2019   Heart murmur    Nausea without vomiting 09/09/2015   Pulmonary hypertension (HCC)    Sickle cell anemia (HCC)    Sickle cell disease, type SS (HCC)    Sickle cell  pain crisis (HCC) 12/05/2016   Thrombocytosis 05/2019   Urinary tract infection    Vitamin D deficiency     Past Surgical History:  Procedure Laterality Date   CHOLECYSTECTOMY  2011   EYE SURGERY     Sty removal   IR IMAGING GUIDED PORT INSERTION  03/06/2022   IR IMAGING GUIDED PORT INSERTION  09/16/2023   IR REMOVAL TUN ACCESS W/ PORT W/O FL MOD SED  05/29/2022   LABIAL ADHESION LYSIS  1999   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 Authorizing Provider  hydroxyurea (HYDREA) 500 MG capsule Take 500 mg by mouth 3 (three) times daily.    [provider]  oxyCODONE (ROXICODONE) 15 MG immediate release tablet Take 15 mg by mouth every 4 (four) hours as needed for pain.    [provider]  oxyCODONE 30 MG 12 hr tablet Take by mouth.    [provider]  QUEtiapine (SEROQUEL) 100 MG tablet Take 100 mg by mouth at bedtime.    [provider]  Vitamin D, Ergocalciferol, (DRISDOL)  1.25 MG (50000 UNIT) CAPS capsule Take 50,000 Units by mouth every 7 (seven) days. Patient not taking: Reported on 10/01/2023    [provider]  XARELTO 20 MG TABS tablet TAKE 1 TABLET BY MOUTH ONCE DAILY WITH SUPPER 06/30/23   [name], MD     Physical Exam: Vitals:   10/28/23 0857 10/28/23 0921 10/28/23 1050  BP: 116/64  113/71  Pulse: 67    Resp: 16 14 12   Temp: 98.5 F (36.9 C)    TempSrc: Temporal    SpO2: 99%  95%    General: Alert, awake, afebrile, anicteric, not in obvious distress HEENT: Normocephalic and Atraumatic,  Mucous membranes pink                PERRLA; EOM intact; No scleral icterus,                 Nares: Patent, Oropharynx: Clear, Fair Dentition                 Neck: FROM, no cervical lymphadenopathy, thyromegaly, carotid bruit or JVD;  CHEST WALL: No tenderness  CHEST: Normal respiration, clear to auscultation bilaterally  HEART: Regular rate and rhythm; no murmurs rubs or gallops  BACK: No kyphosis or scoliosis; no CVA tenderness  ABDOMEN: Positive Bowel Sounds, soft, non-tender; no masses, no organomegaly EXTREMITIES: No cyanosis, clubbing, or edema Musculoskeletal: bilateral hip pain, lower extremity pain SKIN:  no rash or ulceration  CNS: Alert and Oriented x 4, Nonfocal exam, CN 2-12 intact  Labs on Admission:  Basic Metabolic Panel: No results for input(s): "NA", "K", "CL", "CO2", "GLUCOSE", "BUN", "CREATININE", "CALCIUM", "MG", "PHOS" in the last 168 hours. Liver Function Tests: No results for input(s): "AST", "ALT", "ALKPHOS", "BILITOT", "PROT", "ALBUMIN" in the last 168 hours. No results for input(s): "LIPASE", "AMYLASE" in the last 168 hours. No results for input(s): "AMMONIA" in the last 168 hours. CBC: Recent Labs  Lab 10/28/23 0904  WBC 6.5  NEUTROABS 2.7  HGB 6.9*  HCT 20.4*  MCV 109.7*  PLT 406*   Cardiac Enzymes: No results for input(s): "CKTOTAL", "CKMB", "CKMBINDEX", "TROPONINI" in the last 168 hours.  BNP (last 3 results) No results for input(s): "BNP" in the last 8760 hours.  ProBNP (last 3 results) No results for input(s): "PROBNP" in the last 8760 hours.  CBG: No results for input(s): "GLUCAP" in the last 168 hours.   Assessment/Plan Principal Problem:   Sickle-cell disease with pain (HCC)  Admits to the Day Hospital for extended observation IVF D5 .45% Saline @ 125 mls/hour Weight based Dilaudid PCA started within 30 minutes of admission Acetaminophen 1000 mg x 1 dose Labs: CBCD, CMP, Retic Count and LDH Monitor vitals very closely,  Re-evaluate pain scale every hour 2 L of Oxygen by [place] Patient will be re-evaluated for pain in the context of function and relationship to baseline as care progresses. If no significant relieve from pain (remains above 5/10) will transfer patient to inpatient services for further evaluation and management  Code Status: Full  Family Communication: None  DVT Prophylaxis: Ambulate as tolerated   Time spent: 35 Minutes  Daryll Drown NP  If 7PM-7AM, please contact night-coverage [email or web address] 10/28/2023, 12:06 PM

## 2023-10-28 NOTE — Progress Notes (Signed)
  Critical Value: Hgb 6.9  Time and Date notified: 10/28/2023 @ 1100  Provider notified: Lynnell Chad, NP  Action Taken: No new orders placed.

## 2023-10-28 NOTE — Discharge Summary (Addendum)
 Physician Discharge Summary  Sandra Mckay UJW:[id number] DOB: October 28, 1995 DOA: 10/28/2023  PCP: French Ana, FNP  Admit date: 10/28/2023  Discharge date: 10/28/2023  Time spent: 30 minutes  Discharge Diagnoses:  Principal Problem:   Sickle-cell disease with pain [facility])   Discharge Condition: Stable  Diet recommendation: Regular  History of present illness:  Sandra Mckay is a 28 y.o. female with history of sickle cell disease chronic pain syndrome, opiate dependence and tolerance presents with complaints of generalized pain consistent with typical sickle cell pain crisis. Patient states the pain intensity has been increasing over the past 24 hours and unrelieved by her home medications. Patient was in the day clinic 10/15/2023 for pain management. Patient at the time had a hemoglobin of 7.4. After hydration and continuous PCA, patient was discharged home. She presents today with pain 9/10 in her legs, hips and lower back. . Patient denies fever, chills, chest pain, shortness of breath. No urinary symptoms, nausea, vomiting or diarrhea. No sick contacts or recent travel. Patient attributes pain crisis to changes in weather and dehydration.     Hospital Course:  Li Bobo was admitted to the day hospital with sickle cell painful crisis. Patient was treated with IV fluid, weight based IV Dilaudid PCA, clinician assisted doses as deemed appropriate, and other adjunct therapies per sickle cell pain management protocol. Rickia showed significant improvement symptomatically, pain improved from 9/10 to 5/10 at the time of discharge. Patient was discharged home in a hemodynamically stable condition. Kyndel will follow-up at the clinic as previously scheduled, continue with home medications as per prior to admission.  Discharge Instructions We discussed the need for good hydration, monitoring of hydration status, avoidance of heat, cold, stress, and infection triggers. We  discussed the need to be compliant with taking Hydrea and other home medications. Lataya was reminded of the need to seek medical attention immediately if any symptom of bleeding, anemia, or infection occurs.  Discharge Exam: Vitals:   10/28/23 1505 10/28/23 1534  BP: 113/76   Pulse:    Resp: 14 14  Temp:    SpO2: 96%     General appearance: alert, cooperative and no distress Eyes: conjunctivae/corneas clear. PERRL, EOM's intact. Fundi benign. Neck: no adenopathy, no carotid bruit, no JVD, supple, symmetrical, trachea midline and thyroid not enlarged, symmetric, no tenderness/mass/nodules Back: symmetric, no curvature. ROM normal. No CVA tenderness. Resp: clear to auscultation bilaterally Chest wall: no tenderness Cardio: regular rate and rhythm, S1, S2 normal, no murmur, click, rub or gallop GI: soft, non-tender; bowel sounds normal; no masses,  no organomegaly Extremities: extremities normal, atraumatic, no cyanosis or edema Pulses: 2+ and symmetric Skin: Skin color, texture, turgor normal. No rashes or lesions Neurologic: Grossly normal  Discharge Instructions     Diet - low sodium heart healthy   Complete by: As directed    Increase activity slowly   Complete by: As directed       Allergies as of 10/28/2023       Reactions   Latex Rash   Wound Dressing Adhesive Rash        Medication List     TAKE these medications    hydroxyurea 500 MG capsule Commonly known as: HYDREA Take 500 mg by mouth 3 (three) times daily.   oxyCODONE 15 MG immediate release tablet Commonly known as: ROXICODONE Take 15 mg by mouth every 4 (four) hours as needed for pain.   oxyCODONE 30 MG 12 hr tablet Take by mouth.   QUEtiapine 100 MG  tablet Commonly known as: SEROQUEL Take 100 mg by mouth at bedtime.   Vitamin D (Ergocalciferol) 1.25 MG (50000 UNIT) Caps capsule Commonly known as: DRISDOL Take 50,000 Units by mouth every 7 (seven) days.   Xarelto 20 MG Tabs  tablet Generic drug: rivaroxaban TAKE 1 TABLET BY MOUTH ONCE DAILY WITH SUPPER       Allergies  Allergen Reactions   Latex Rash   Wound Dressing Adhesive Rash     Significant Diagnostic Studies: No results found.  Signed:  Daryll Drown NP  10/28/2023, 4:03 PM

## 2024-01-10 ENCOUNTER — Inpatient Hospital Stay (HOSPITAL_COMMUNITY)

## 2024-01-10 ENCOUNTER — Observation Stay (HOSPITAL_COMMUNITY)
Admission: EM | Admit: 2024-01-10 | Discharge: 2024-01-11 | Disposition: A | Discharge status: Home / Self Care | Attending: Internal Medicine | Admitting: Internal Medicine

## 2024-01-10 ENCOUNTER — Encounter (HOSPITAL_COMMUNITY): Payer: Self-pay

## 2024-01-10 ENCOUNTER — Other Ambulatory Visit: Payer: Self-pay

## 2024-01-10 DIAGNOSIS — F39 Unspecified mood [affective] disorder: Secondary | ICD-10-CM | POA: Insufficient documentation

## 2024-01-10 DIAGNOSIS — F17210 Nicotine dependence, cigarettes, uncomplicated: Secondary | ICD-10-CM | POA: Insufficient documentation

## 2024-01-10 DIAGNOSIS — D5700 Hb-SS disease with crisis, unspecified: Secondary | ICD-10-CM | POA: Diagnosis present

## 2024-01-10 DIAGNOSIS — Z91040 Latex allergy status: Secondary | ICD-10-CM | POA: Insufficient documentation

## 2024-01-10 DIAGNOSIS — Z79899 Other long term (current) drug therapy: Secondary | ICD-10-CM | POA: Diagnosis not present

## 2024-01-10 DIAGNOSIS — F1090 Alcohol use, unspecified, uncomplicated: Secondary | ICD-10-CM | POA: Diagnosis not present

## 2024-01-10 DIAGNOSIS — D72829 Elevated white blood cell count, unspecified: Secondary | ICD-10-CM | POA: Diagnosis not present

## 2024-01-10 DIAGNOSIS — G894 Chronic pain syndrome: Secondary | ICD-10-CM | POA: Diagnosis not present

## 2024-01-10 DIAGNOSIS — F1290 Cannabis use, unspecified, uncomplicated: Secondary | ICD-10-CM | POA: Insufficient documentation

## 2024-01-10 DIAGNOSIS — D638 Anemia in other chronic diseases classified elsewhere: Secondary | ICD-10-CM | POA: Insufficient documentation

## 2024-01-10 LAB — CBC WITH DIFFERENTIAL/PLATELET
Abs Immature Granulocytes: 0.02 10*3/uL (ref 0.00–0.07)
Basophils Absolute: 0.1 10*3/uL (ref 0.0–0.1)
Basophils Relative: 1 %
Eosinophils Absolute: 0 10*3/uL (ref 0.0–0.5)
Eosinophils Relative: 0 %
HCT: 20.1 % — ABNORMAL LOW (ref 36.0–46.0)
Hemoglobin: 6.9 g/dL — CL (ref 12.0–15.0)
Immature Granulocytes: 0 %
Lymphocytes Relative: 49 %
Lymphs Abs: 4.5 10*3/uL — ABNORMAL HIGH (ref 0.7–4.0)
MCH: 35.6 pg — ABNORMAL HIGH (ref 26.0–34.0)
MCHC: 34.3 g/dL (ref 30.0–36.0)
MCV: 103.6 fL — ABNORMAL HIGH (ref 80.0–100.0)
Monocytes Absolute: 1.5 10*3/uL — ABNORMAL HIGH (ref 0.1–1.0)
Monocytes Relative: 16 %
Neutro Abs: 3.2 10*3/uL (ref 1.7–7.7)
Neutrophils Relative %: 34 %
Platelets: 273 10*3/uL (ref 150–400)
RBC: 1.94 MIL/uL — ABNORMAL LOW (ref 3.87–5.11)
RDW: 22.4 % — ABNORMAL HIGH (ref 11.5–15.5)
WBC: 9.4 10*3/uL (ref 4.0–10.5)
nRBC: 0.3 % — ABNORMAL HIGH (ref 0.0–0.2)

## 2024-01-10 LAB — HCG, SERUM, QUALITATIVE: Preg, Serum: NEGATIVE

## 2024-01-10 LAB — RETICULOCYTES
Immature Retic Fract: 26.4 % — ABNORMAL HIGH (ref 2.3–15.9)
RBC.: 1.96 MIL/uL — ABNORMAL LOW (ref 3.87–5.11)
Retic Count, Absolute: 210.7 10*3/uL — ABNORMAL HIGH (ref 19.0–186.0)
Retic Ct Pct: 10.8 % — ABNORMAL HIGH (ref 0.4–3.1)

## 2024-01-10 LAB — COMPREHENSIVE METABOLIC PANEL WITH GFR
ALT: 16 U/L (ref 0–44)
AST: 24 U/L (ref 15–41)
Albumin: 4.3 g/dL (ref 3.5–5.0)
Alkaline Phosphatase: 80 U/L (ref 38–126)
Anion gap: 9 (ref 5–15)
BUN: 10 mg/dL (ref 6–20)
CO2: 22 mmol/L (ref 22–32)
Calcium: 9 mg/dL (ref 8.9–10.3)
Chloride: 107 mmol/L (ref 98–111)
Creatinine, Ser: 0.49 mg/dL (ref 0.44–1.00)
GFR, Estimated: >60 mL/min (ref >60)
Glucose, Bld: 95 mg/dL (ref 70–99)
Potassium: 3.6 mmol/L (ref 3.5–5.1)
Sodium: 138 mmol/L (ref 135–145)
Total Bilirubin: 1.7 mg/dL — ABNORMAL HIGH (ref 0.0–1.2)
Total Protein: 6.9 g/dL (ref 6.5–8.1)

## 2024-01-10 LAB — MRSA NEXT GEN BY PCR, NASAL: MRSA by PCR Next Gen: NOT DETECTED

## 2024-01-10 MED ORDER — OXYCODONE HCL 5 MG PO TABS: 15.0000 mg | ORAL_TABLET | Freq: Four times a day (QID) | ORAL | Status: DC | PRN

## 2024-01-10 MED ORDER — HYDROMORPHONE 1 MG/ML IV SOLN
INTRAVENOUS | Status: DC
  Administered 2024-01-10: 5.25 mg via INTRAVENOUS
  Administered 2024-01-10: 7 mg via INTRAVENOUS
  Administered 2024-01-11: 30 mg via INTRAVENOUS
  Administered 2024-01-11: 6 mg via INTRAVENOUS
  Filled 2024-01-10: qty 30

## 2024-01-10 MED ORDER — OXYCODONE HCL 5 MG PO TABS: 7.5000 mg | ORAL_TABLET | Freq: Four times a day (QID) | ORAL | Status: DC | PRN

## 2024-01-10 MED ORDER — SODIUM CHLORIDE 0.9% FLUSH: 9.0000 mL | INTRAVENOUS | Status: DC | PRN

## 2024-01-10 MED ORDER — DIPHENHYDRAMINE HCL 25 MG PO CAPS: 25.0000 mg | ORAL_CAPSULE | ORAL | Status: DC | PRN

## 2024-01-10 MED ORDER — HYDROMORPHONE HCL 1 MG/ML IJ SOLN
2.0000 mg | INTRAMUSCULAR | Status: AC
  Administered 2024-01-10: 2 mg via INTRAVENOUS
  Filled 2024-01-10: qty 2

## 2024-01-10 MED ORDER — RIVAROXABAN 20 MG PO TABS
20.0000 mg | ORAL_TABLET | Freq: Every day | ORAL | Status: DC
  Administered 2024-01-10: 20 mg via ORAL
  Filled 2024-01-10: qty 1

## 2024-01-10 MED ORDER — HYDROXYUREA 500 MG PO CAPS: 500.0000 mg | ORAL_CAPSULE | Freq: Three times a day (TID) | ORAL | Status: DC

## 2024-01-10 MED ORDER — FOLIC ACID 1 MG PO TABS
1.0000 mg | ORAL_TABLET | Freq: Every day | ORAL | Status: DC
  Administered 2024-01-10 – 2024-01-11 (×2): 1 mg via ORAL
  Filled 2024-01-10 (×2): qty 1

## 2024-01-10 MED ORDER — SODIUM CHLORIDE 0.9% FLUSH: 10.0000 mL | INTRAVENOUS | Status: DC | PRN

## 2024-01-10 MED ORDER — NALOXONE HCL 0.4 MG/ML IJ SOLN: 0.4000 mg | INTRAMUSCULAR | Status: DC | PRN

## 2024-01-10 MED ORDER — HYDROMORPHONE 1 MG/ML IV SOLN
INTRAVENOUS | Status: DC
  Administered 2024-01-10: 30 mg via INTRAVENOUS
  Filled 2024-01-10: qty 30

## 2024-01-10 MED ORDER — ONDANSETRON HCL 4 MG/2ML IJ SOLN
4.0000 mg | INTRAMUSCULAR | Status: DC | PRN
  Administered 2024-01-10: 4 mg via INTRAVENOUS
  Filled 2024-01-10: qty 2

## 2024-01-10 MED ORDER — SODIUM CHLORIDE 0.45 % IV SOLN: INTRAVENOUS | Status: DC

## 2024-01-10 MED ORDER — HYDROMORPHONE HCL 1 MG/ML IJ SOLN
1.5000 mg | INTRAMUSCULAR | Status: DC | PRN
  Administered 2024-01-10: 1.5 mg via INTRAVENOUS
  Filled 2024-01-10: qty 2

## 2024-01-10 MED ORDER — SODIUM CHLORIDE 0.9% FLUSH
10.0000 mL | Freq: Two times a day (BID) | INTRAVENOUS | Status: DC
  Administered 2024-01-10: 10 mL

## 2024-01-10 MED ORDER — OXYCODONE HCL ER 15 MG PO T12A
30.0000 mg | EXTENDED_RELEASE_TABLET | Freq: Two times a day (BID) | ORAL | Status: DC
  Administered 2024-01-10 – 2024-01-11 (×3): 30 mg via ORAL
  Filled 2024-01-10 (×3): qty 2

## 2024-01-10 MED ORDER — OXYCODONE HCL 5 MG PO TABS
15.0000 mg | ORAL_TABLET | Freq: Four times a day (QID) | ORAL | Status: DC
  Administered 2024-01-10 (×2): 15 mg via ORAL
  Filled 2024-01-10 (×2): qty 3

## 2024-01-10 MED ORDER — QUETIAPINE FUMARATE 25 MG PO TABS
25.0000 mg | ORAL_TABLET | Freq: Every day | ORAL | Status: DC
  Administered 2024-01-10: 25 mg via ORAL
  Filled 2024-01-10: qty 1

## 2024-01-10 MED ORDER — CHLORHEXIDINE GLUCONATE CLOTH 2 % EX PADS
6.0000 | MEDICATED_PAD | Freq: Every day | CUTANEOUS | Status: DC
  Administered 2024-01-10: 6 via TOPICAL

## 2024-01-10 MED ORDER — HYDROMORPHONE HCL 1 MG/ML IJ SOLN
2.0000 mg | Freq: Once | INTRAMUSCULAR | Status: AC
  Administered 2024-01-10: 2 mg via INTRAVENOUS
  Filled 2024-01-10: qty 2

## 2024-01-10 MED ORDER — SODIUM CHLORIDE 0.9% FLUSH
3.0000 mL | Freq: Two times a day (BID) | INTRAVENOUS | Status: DC
  Administered 2024-01-10: 3 mL via INTRAVENOUS

## 2024-01-10 MED ORDER — HYDROXYUREA 500 MG PO CAPS
1500.0000 mg | ORAL_CAPSULE | Freq: Every day | ORAL | Status: DC
  Administered 2024-01-10 – 2024-01-11 (×2): 1500 mg via ORAL
  Filled 2024-01-10 (×2): qty 3

## 2024-01-10 MED ORDER — ONDANSETRON HCL 4 MG/2ML IJ SOLN
4.0000 mg | Freq: Four times a day (QID) | INTRAMUSCULAR | Status: DC | PRN
  Administered 2024-01-10: 4 mg via INTRAVENOUS
  Filled 2024-01-10: qty 2

## 2024-01-10 MED ORDER — LIDOCAINE 5 % EX PTCH
2.0000 | MEDICATED_PATCH | CUTANEOUS | Status: DC
  Administered 2024-01-10 – 2024-01-11 (×2): 2 via TRANSDERMAL
  Filled 2024-01-10 (×2): qty 2

## 2024-01-10 NOTE — Progress Notes (Signed)
   01/10/24 1214  TOC Brief Assessment  Insurance and Status Reviewed (Pt has Medicare A/B as primary,  Medicaid)  Patient has primary care physician No (Pt has recently moved and has not had the opportunity to find a new PCP)  Home environment has been reviewed Single family home  Prior level of function: Independent  Prior/Current Home Services No current home services  Social Drivers of Health Review SDOH reviewed no interventions necessary  Readmission risk has been reviewed Yes (24%, [place])  Transition of care needs no transition of care needs at this time

## 2024-01-10 NOTE — ED Triage Notes (Signed)
 Patient reports sickle cell pain crisis with pain in her legs. States her last dose of pain medication was taken at 2000. Rates pain in her legs 8/10.

## 2024-01-10 NOTE — ED Notes (Signed)
 Pt BP been trending low for the past few hours. Pt report low BP is normal for her. Pt denies chest pain, dizziness, weakness, SOB. No symptoms due to low BP. Pt c/o pain due to sickle cell pain meds will be given. Provider notified

## 2024-01-10 NOTE — H&P (Signed)
 History and Physical    Sandra Mckay XBM:[id number] DOB: [birth year] DOA: 01/10/2024  PCP: Pcp, No   Patient coming from: Home   Chief Complaint:  Chief Complaint  Patient presents with   Sickle Cell Pain Crisis    HPI:  Sandra Mckay is a 28 y.o. 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 Reports recent move out to [facility], but no hematologist or PCP in area there. Also no access to infusion clinic which has been helpful here. She attributes worsening of pain related to a few things including stressors from move, and onset of her menstrual cycle and would like to start on OCP to control this. Pain is 7/10 in back, hips, knees. She denies any urinary symptoms, fever, cough, nausea /vomiting chest pain  and no recent travels.  Review of Systems:  ROS complete and negative except as marked above   Allergies  Allergen Reactions   Latex Rash   Wound Dressing Adhesive Rash    Prior to Admission medications   Medication Sig Start Date End Date Taking? Authorizing Provider  hydroxyurea  (HYDREA ) 500 MG capsule Take 500 mg by mouth 3 (three) times daily.    [provider]  ondansetron  (ZOFRAN ) 4 MG tablet Take 1 tablet (4 mg total) by mouth every 8 (eight) hours as needed for nausea or vomiting. 10/28/23   Lorel Roes, NP  oxyCODONE  (ROXICODONE ) 15 MG immediate release tablet Take 15 mg by mouth every 4 (four) hours as needed for pain.    [provider]  oxyCODONE  30 MG 12 hr tablet Take by mouth.    [provider]  QUEtiapine  (SEROQUEL ) 100 MG tablet Take 100 mg by mouth at bedtime.    [provider]  Vitamin D , Ergocalciferol , (DRISDOL ) 1.25 MG (50000 UNIT) CAPS capsule Take 50,000 Units by mouth every 7 (seven) days. Patient not taking: Reported on 10/01/2023    [provider]  XARELTO  20 MG TABS  tablet TAKE 1 TABLET BY MOUTH ONCE DAILY WITH SUPPER 06/30/23   [name], Olugbemiga E, MD    Past Medical History:  Diagnosis Date   Acute cystitis without hematuria 10/21/2019   Blood transfusion without reported diagnosis    Bronchitis    Chickenpox    Depression    Elevated ferritin level 05/2019   Heart murmur    Nausea without vomiting 09/09/2015   Pulmonary hypertension (HCC)    Sickle cell anemia (HCC)    Sickle cell disease, type SS (HCC)    Sickle cell pain crisis (HCC) 12/05/2016   Thrombocytosis 05/2019   Urinary tract infection    Vitamin D  deficiency     Past Surgical History:  Procedure Laterality Date   CHOLECYSTECTOMY  2011   EYE SURGERY     Sty removal   IR IMAGING GUIDED PORT INSERTION  03/06/2022   IR IMAGING GUIDED PORT INSERTION  09/16/2023   IR REMOVAL TUN ACCESS W/ PORT W/O FL MOD SED  05/29/2022   LABIAL ADHESION LYSIS  1999   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 She has never used smokeless tobacco. She reports current alcohol use. She reports current drug use. Drug: Marijuana.  Family History  Problem Relation Age of Onset   Arthritis Other  grandparent   Stroke Other    Hypertension Other    Diabetes Other        grandparent   Cancer - Other Other        Glioblastoma     Physical Exam: Vitals:   01/11/24 0100 01/11/24 0338 01/11/24 0541 01/11/24 0804  BP: 97/62  103/68   Pulse: 61  62   Resp:  14 10 14   Temp: 98.7 F (37.1 C)  98.2 F (36.8 C)   TempSrc: Oral  Oral   SpO2: 97%  100% 98%  Weight:      Height:        Gen: Awake, alert, NAD   CV: Regular, normal S1, S2, no murmurs  Resp: Normal WOB, CTAB  Abd: Flat, normoactive, nontender MSK: Symmetric, no edema. Ranges LE with mild pain. log roll of legs with minimal pain  Skin: No rashes or lesions to exposed skin  Neuro: Alert and interactive  Psych: euthymic,  appropriate    Data review:   Labs reviewed, notable for:   Hb 6.9 , macrocytic  Retic% 10.8  T bili 1.7   Micro:  Results for orders placed or performed during the hospital encounter of 01/10/24  MRSA Next Gen by PCR, Nasal     Status: None   Collection Time: 01/10/24  8:50 AM   Specimen: Nasal Mucosa; Nasal Swab  Result Value Ref Range Status   MRSA by PCR Next Gen NOT DETECTED NOT DETECTED Final    Comment: (NOTE) The GeneXpert MRSA Assay (FDA approved for NASAL specimens only), is one component of a comprehensive MRSA colonization surveillance program. It is not intended to diagnose MRSA infection nor to guide or monitor treatment for MRSA infections. Test performance is not FDA approved in patients less than 12 years old. Performed at [facility], [address] [place], [place] [address]     Imaging reviewed:   Reviewed CXR: slight pulm vascular congestion, no infiltrates or evidence of acute chest.    ED Course:  Treated with Dilaudid  2 mg IV x 3 without relief of pain, zofran .     Assessment/Plan:  28 y.o. 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 Admitted with sickle cell VOC.   Sickle cell VOC  Hx acute worsening back, hip, knee pain, c/w prior crises. Uncontrolled with pain medication in the ED. Trigger attributed to recent stress of move and menstrual cycle. Hb 6.9, macrocytic. Retic 10.8%, T Bili 1.5. CXR on my review no infiltrate or evidence of acute chest.  - Sickle cell team to assume care in a.m. - Hold on transfusion as Hb near baseline ~7.  - Start on Dilaudid  PCA 0.25 mg q 10 min with 1.5 mg q 1 hr lockout; reduced dose from prior admission with additional PO prn per below; reassess control and uptrate q4-6 hr prn  - Continue home oxycodone  ER 30 mg every 12 hours - Schedule home oxycodone  IR 15 mg every 6  hour, add additional 7.5 mg / 15 mg every 6 hours as needed for moderate/severe pain on top of scheduled dose - Adjuncts heat, lidocaine ; avoiding NSAID with concurrent anticoagulation  - Folic acid  daily, hydroxyurea  1500 mg daily - Incentive spirometry - stop mIVF considering vascular congestion on XR and able to take PO, continue oral hydration.   Chronic medical problems: History of PE: Continue on  Xarelto  Mood disorder: Would like to resume on home Seroquel  reports recently Rx 25 mg bedtime Request for OCP: Would like to start on OCP for menstrual cycle regulation.    Body mass index is 23.78 kg/m.    DVT prophylaxis:  Xarelto  Code Status:  Full Code Diet:  Diet Orders (From admission, onward)     Start     Ordered   01/10/24 0505  Diet regular Room service appropriate? Yes; Fluid consistency: Thin  Diet effective now       Question Answer Comment  Room service appropriate? Yes   Fluid consistency: Thin      01/10/24 0505           Family Communication:  None   Consults:  None   Admission status:   Inpatient, Step Down Unit  Severity of Illness: The appropriate patient status for this patient is INPATIENT. Inpatient status is judged to be reasonable and necessary in order to provide the required intensity of service to ensure the patient's safety. The patient's presenting symptoms, physical exam findings, and initial radiographic and laboratory data in the context of their chronic comorbidities is felt to place them at high risk for further clinical deterioration. Furthermore, it is not anticipated that the patient will be medically stable for discharge from the hospital within 2 midnights of admission.   * I certify that at the point of admission it is my clinical judgment that the patient will require inpatient hospital care spanning beyond 2 midnights from the point of admission due to high intensity of service, high risk for further deterioration and high frequency of  surveillance required.*   Lorel Roes, NP  [facility]  How to contact the [facility] Attending or Consulting provider 7A - 7P or covering provider during after hours 7P -7A, for this patient.  Check the care team in [facility] and look for a) attending/consulting [facility] provider listed and b) the [facility] team listed Log into [email or web address] and use [facility]'s universal password to access. If you do not have the password, please contact the hospital operator. Locate the [facility] provider you are looking for under [facility] and page to a number that you can be directly reached. If you still have difficulty reaching the provider, please page the [facility] (Director on Call) for the Hospitalists listed on amion for assistance.  01/11/2024, 1:34 PM

## 2024-01-10 NOTE — ED Provider Notes (Signed)
 [place] EMERGENCY DEPARTMENT AT [facility] Provider Note   CSN: [id number] Arrival date & time: 01/10/24  0142     History  Chief Complaint  Patient presents with   Sickle Cell Pain Crisis    Sandra Mckay is a 28 y.o. female.  The history is provided by the patient and medical records.  Sickle Cell Pain Crisis  28 year old female with history of sickle cell anemia, vitamin D  deficiency, pulmonary hypertension, history of PE on Xarelto , presenting to the ED with sickle cell pain crisis.  Has recently relocated back to [facility] which is her hometown.  States since being there she just does not feel she is getting the adequate hematologic care like she was here in [place].  States she has been in the hospital with various issues recently.  Has recently had blood transfusion due to her sickle cell.  States pretty much all weekend she has been having pain in her back and legs which is typical.  She denies any chest pain, shortness of breath, fever, chills.  She has been compliant with her medications. Was recently switched back to hydroxyurea , only been on this again for a few weeks.  Home Medications Prior to Admission medications   Medication Sig Start Date End Date Taking? Authorizing Provider  hydroxyurea  (HYDREA ) 500 MG capsule Take 500 mg by mouth 3 (three) times daily.    [provider]  ondansetron  (ZOFRAN ) 4 MG tablet Take 1 tablet (4 mg total) by mouth every 8 (eight) hours as needed for nausea or vomiting. 10/28/23   Lorel Roes, NP  oxyCODONE  (ROXICODONE ) 15 MG immediate release tablet Take 15 mg by mouth every 4 (four) hours as needed for pain.    [provider]  oxyCODONE  30 MG 12 hr tablet Take by mouth.    [provider]  QUEtiapine  (SEROQUEL ) 100 MG tablet Take 100 mg by mouth at bedtime.    [provider]  Vitamin D , Ergocalciferol , (DRISDOL ) 1.25 MG (50000 UNIT) CAPS capsule Take 50,000 Units by mouth  every 7 (seven) days. Patient not taking: Reported on 10/01/2023    [provider]  XARELTO  20 MG TABS tablet TAKE 1 TABLET BY MOUTH ONCE DAILY WITH SUPPER 06/30/23   [name], Olugbemiga E, MD      Allergies    Latex and Wound dressing adhesive    Review of Systems   Review of Systems  Musculoskeletal:  Positive for arthralgias.  All other systems reviewed and are negative.   Physical Exam Updated Vital Signs BP 110/62   Pulse 78   Temp 98.3 F (36.8 C) (Oral)   Resp 18   Ht 5\' 2"  (1.575 m)   Wt 59 kg   SpO2 99%   BMI 23.78 kg/m   Physical Exam Vitals and nursing note reviewed.  Constitutional:      Appearance: She is well-developed.  HENT:     Head: Normocephalic and atraumatic.  Eyes:     Conjunctiva/sclera: Conjunctivae normal.     Pupils: Pupils are equal, round, and reactive to light.  Cardiovascular:     Rate and Rhythm: Normal rate and regular rhythm.     Heart sounds: Normal heart sounds.  Pulmonary:     Effort: Pulmonary effort is normal. No respiratory distress.     Breath sounds: Normal breath sounds. No rhonchi.  Abdominal:     General: Bowel sounds are normal.     Palpations: Abdomen is soft.     Tenderness:  There is no abdominal tenderness. There is no rebound.  Musculoskeletal:        General: Normal range of motion.     Cervical back: Normal range of motion.  Skin:    General: Skin is warm and dry.  Neurological:     Mental Status: She is alert and oriented to person, place, and time.     ED Results / Procedures / Treatments   Labs (all labs ordered are listed, but only abnormal results are displayed) Labs Reviewed  COMPREHENSIVE METABOLIC PANEL WITH GFR - Abnormal; Notable for the following components:      Result Value   Total Bilirubin 1.7 (*)    All other components within normal limits  CBC WITH DIFFERENTIAL/PLATELET - Abnormal; Notable for the following components:   RBC 1.94 (*)    Hemoglobin 6.9 (*)    HCT 20.1 (*)     MCV 103.6 (*)    MCH 35.6 (*)    RDW 22.4 (*)    nRBC 0.3 (*)    Lymphs Abs 4.5 (*)    Monocytes Absolute 1.5 (*)    All other components within normal limits  RETICULOCYTES - Abnormal; Notable for the following components:   Retic Ct Pct 10.8 (*)    RBC. 1.96 (*)    Retic Count, Absolute 210.7 (*)    Immature Retic Fract 26.4 (*)    All other components within normal limits  HCG, SERUM, QUALITATIVE    EKG None  Radiology No results found.  Procedures Procedures     CRITICAL CARE Performed by: Coretha Dew   Total critical care time: 35 minutes  Critical care time was exclusive of separately billable procedures and treating other patients.  Critical care was necessary to treat or prevent imminent or life-threatening deterioration.  Critical care was time spent personally by me on the following activities: development of treatment plan with patient and/or surrogate as well as nursing, discussions with consultants, evaluation of patient's response to treatment, examination of patient, obtaining history from patient or surrogate, ordering and performing treatments and interventions, ordering and review of laboratory studies, ordering and review of radiographic studies, pulse oximetry and re-evaluation of patient's condition.   Medications Ordered in ED Medications - No data to display  ED Course/ Medical Decision Making/ A&P                                 Medical Decision Making Amount and/or Complexity of Data Reviewed Labs: ordered. ECG/medicine tests: ordered and independent interpretation performed.  Risk Prescription drug management. Decision regarding hospitalization.   28 year old female here with sickle cell pain crisis.  Pain in legs and back which is typical for her.  Has relocated to [facility] and has not established with hematology there.  She is afebrile and nontoxic in appearance here.  She denies any chest pain or shortness of breath.   Hemodynamically stable.  Labs pending.  Initiated IV fluids, pain control.  Will reassess  4:00 AM  Reassessed.  Remains hemodynamically stable.  States pain is minimally better from time of arrival.  Still feels very uncomfortable.  Remains without chest pain or shortness of breath.  Vitals are stable.  Feel she requires admission.  Hemoglobin today is 6.9.  Per chart review, it appears she requires transfusion when below 6.5, patient confirms.  Will trend.  Discussed with Dr. Amy Kansky-- will admit for pain control.  Final Clinical  Impression(s) / ED Diagnoses Final diagnoses:  Sickle cell pain crisis [facility])    Rx / DC Orders  ED Discharge Orders     None         Coretha Dew, PA-C 01/10/24 0505    Kelsey Patricia, MD 01/10/24 [phone]

## 2024-01-10 NOTE — Plan of Care (Signed)

## 2024-01-10 NOTE — Progress Notes (Cosign Needed)
 Patient ID: Sandra Mckay, female   DOB: 12-Oct-1995, 28 y.o.   MRN: [id number] Subjective: Sandra Mckay is a 28 y.o. 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 Reports recent move out to [facility], but no hematologist or PCP in area there with no access to infusion clinic which has been helpful here. She attributes worsening pain to stressors from relocation, and her menstrual cycle and would like to start on OCP to control this.   Patient continues to report pain this morning at 7/10. She has no new concerns. She is ambulatory without assistance. improved blood pressures/ hypotension resolved. She will be transferred from step down ICU unit to the 6th floor for ongoing sickle cell pain management.  Objective:  Vital signs in last 24 hours:  Vitals:   01/10/24 1000 01/10/24 1105 01/10/24 1200 01/10/24 1207  BP: 99/61 (!) 93/55 104/64   Pulse: (!) 56 (!) 53 (!) 58   Resp: 13 (!) 8 12 16   Temp:   98 F (36.7 C)   TempSrc:   Oral   SpO2: 100% 97% 100% 100%  Weight:      Height:        Intake/Output from previous day:   Intake/Output Summary (Last 24 hours) at 01/10/2024 1331 Last data filed at 01/10/2024 1100 Gross per 24 hour  Intake 376.64 ml  Output --  Net 376.64 ml    Physical Exam: General: Alert, awake, oriented x3, in no acute distress.  HEENT: [place]/AT PEERL, EOMI Neck: Trachea midline,  no masses, no thyromegal,y no JVD, no carotid bruit OROPHARYNX:  Moist, No exudate/ erythema/lesions.  Heart: Regular rate and rhythm, without murmurs, rubs, gallops, PMI non-displaced, no heaves or thrills on palpation.  Lungs: Clear to auscultation, no wheezing or rhonchi noted. No increased vocal fremitus resonant to percussion  Abdomen: Soft, nontender, nondistended, positive bowel sounds, no masses no hepatosplenomegaly noted..  Neuro: No focal  neurological deficits noted cranial nerves II through XII grossly intact. DTRs 2+ bilaterally upper and lower extremities. Strength 5 out of 5 in bilateral upper and lower extremities. Musculoskeletal: Back, hip and bilateral lower extremity tenderness.  Psychiatric: Patient alert and oriented x3, good insight and cognition, good recent to remote recall. Lymph node survey: No cervical axillary or inguinal lymphadenopathy noted.  Lab Results:  Basic Metabolic Panel:    Component Value Date/Time   NA 138 01/10/2024 0230   NA 140 02/02/2023 1219   K 3.6 01/10/2024 0230   CL 107 01/10/2024 0230   CO2 22 01/10/2024 0230   BUN 10 01/10/2024 0230   BUN 7 02/02/2023 1219   CREATININE 0.49 01/10/2024 0230   CREATININE 0.53 05/31/2017 1145   GLUCOSE 95 01/10/2024 0230   CALCIUM 9.0 01/10/2024 0230   CBC:    Component Value Date/Time   WBC 9.4 01/10/2024 0230   HGB 6.9 (LL) 01/10/2024 0230   HGB 8.4 (L) 02/02/2023 1219   HCT 20.1 (L) 01/10/2024 0230   HCT 24.9 (L) 02/02/2023 1219   PLT 273 01/10/2024 0230   PLT 492 (H) 02/02/2023 1219   MCV 103.6 (H) 01/10/2024 0230   MCV 106 (H) 02/02/2023 1219   NEUTROABS 3.2 01/10/2024 0230   NEUTROABS 5.9 02/02/2023 1219   LYMPHSABS 4.5 (H) 01/10/2024 0230   LYMPHSABS 3.6 (H) 02/02/2023 1219   MONOABS 1.5 (H) 01/10/2024 0230   EOSABS 0.0 01/10/2024 0230  EOSABS 0.1 02/02/2023 1219   BASOSABS 0.1 01/10/2024 0230   BASOSABS 0.1 02/02/2023 1219    Recent Results (from the past 240 hours)  MRSA Next Gen by PCR, Nasal     Status: None   Collection Time: 01/10/24  8:50 AM   Specimen: Nasal Mucosa; Nasal Swab  Result Value Ref Range Status   MRSA by PCR Next Gen NOT DETECTED NOT DETECTED Final    Comment: (NOTE) The GeneXpert MRSA Assay (FDA approved for NASAL specimens only), is one component of a comprehensive MRSA colonization surveillance program. It is not intended to diagnose MRSA infection nor to guide or monitor treatment for MRSA  infections. Test performance is not FDA approved in patients less than 48 years old. Performed at [facility], [address], [place], [place] [address]     Studies/Results: DG CHEST PORT 1 VIEW Result Date: 01/10/2024 CLINICAL DATA:  Sickle cell crisis.  Pain. EXAM: PORTABLE CHEST 1 VIEW COMPARISON:  05/26/2022 FINDINGS: Cardiopericardial silhouette is at upper limits of normal for size. The lungs are clear without focal pneumonia, edema, pneumothorax or pleural effusion. Port-A-Cath has been repositioned in the interval, with tip now overlying the low SVC level near the junction with the RA. No acute bony abnormality. IMPRESSION: 1. No active disease. 2. Interval repositioning of Port-A-Cath tip, now overlying the low SVC level near the junction with the RA. Electronically Signed   By: Donnal Fusi M.D.   On: 01/10/2024 05:41    Medications: Scheduled Meds:  Chlorhexidine  Gluconate Cloth  6 each Topical Daily   folic acid   1 mg Oral Daily   HYDROmorphone    Intravenous Q4H   hydroxyurea   1,500 mg Oral Daily   lidocaine   2 patch Transdermal Q24H   oxyCODONE   30 mg Oral Q12H   QUEtiapine   25 mg Oral QHS   rivaroxaban   20 mg Oral Q supper   Continuous Infusions:  sodium chloride  125 mL/hr at 01/10/24 1325   PRN Meds:.diphenhydrAMINE , naloxone  **AND** sodium chloride  flush, ondansetron  (ZOFRAN ) IV, oxyCODONE , sodium chloride  flush  Consultants: None  Procedures: None  Antibiotics: None  Assessment/Plan: Principal Problem:   Vaso-occlusive sickle cell crisis (HCC) Active Problems:   Leukocytosis   Anemia of chronic disease   Chronic pain syndrome   Hb Sickle Cell Disease with Pain crisis: Continue IVF 0.45% Saline @125  mls/hour, continue weight based Dilaudid  PCA. continue oral home pain medications as ordered. Monitor vitals very closely, Re-evaluate pain scale regularly, 2 L of Oxygen  by [place]. Patient encouraged to ambulate on the hallway today.   Leukocytosis: Stable  Anemia of Chronic Disease: Hgb lower than patients baseline at 6.9 g.dl, will continue to monitor daily cbc and transfuse appropriately Chronic pain Syndrome: Continue oral home medication    Code Status: Full Code Family Communication: N/A Disposition Plan: Not yet ready for discharge  Lorel Roes NP If 7PM-7AM, please contact night-coverage.  01/10/2024, 1:31 PM  LOS: 0 days

## 2024-01-11 DIAGNOSIS — D5700 Hb-SS disease with crisis, unspecified: Secondary | ICD-10-CM | POA: Diagnosis not present

## 2024-01-11 LAB — CBC
HCT: 20.6 % — ABNORMAL LOW (ref 36.0–46.0)
Hemoglobin: 6.9 g/dL — CL (ref 12.0–15.0)
MCH: 35 pg — ABNORMAL HIGH (ref 26.0–34.0)
MCHC: 33.5 g/dL (ref 30.0–36.0)
MCV: 104.6 fL — ABNORMAL HIGH (ref 80.0–100.0)
Platelets: 260 10*3/uL (ref 150–400)
RBC: 1.97 MIL/uL — ABNORMAL LOW (ref 3.87–5.11)
RDW: 22.1 % — ABNORMAL HIGH (ref 11.5–15.5)
WBC: 9.4 10*3/uL (ref 4.0–10.5)
nRBC: 0.4 % — ABNORMAL HIGH (ref 0.0–0.2)

## 2024-01-11 LAB — BASIC METABOLIC PANEL WITH GFR
Anion gap: 5 (ref 5–15)
BUN: <5 mg/dL — ABNORMAL LOW (ref 6–20)
CO2: 26 mmol/L (ref 22–32)
Calcium: 8.5 mg/dL — ABNORMAL LOW (ref 8.9–10.3)
Chloride: 102 mmol/L (ref 98–111)
Creatinine, Ser: 0.38 mg/dL — ABNORMAL LOW (ref 0.44–1.00)
GFR, Estimated: >60 mL/min (ref >60)
Glucose, Bld: 87 mg/dL (ref 70–99)
Potassium: 3.5 mmol/L (ref 3.5–5.1)
Sodium: 133 mmol/L — ABNORMAL LOW (ref 135–145)

## 2024-01-11 LAB — PHOSPHORUS: Phosphorus: 4 mg/dL (ref 2.5–4.6)

## 2024-01-11 LAB — MAGNESIUM: Magnesium: 1.8 mg/dL (ref 1.7–2.4)

## 2024-01-11 MED ORDER — HEPARIN SOD (PORK) LOCK FLUSH 100 UNIT/ML IV SOLN
500.0000 [IU] | Freq: Once | INTRAVENOUS | Status: AC
  Administered 2024-01-11: 500 [IU] via INTRAVENOUS
  Filled 2024-01-11: qty 5

## 2024-01-11 NOTE — Care Management CC44 (Signed)
 Condition Code 44 Documentation Completed  Patient Details  Name: Sandra Mckay MRN: [id number] Date of Birth: [birth year]   Condition Code 44 given:  Yes Patient signature on Condition Code 44 notice:  Yes Documentation of 2 MD's agreement:  Yes Code 44 added to claim:  Yes    Loreda Rodriguez, RN 01/11/2024, 12:13 PM

## 2024-01-11 NOTE — Discharge Summary (Signed)
 Physician Discharge Summary  Sandra Mckay OZH:[id number] DOB: [birth year] DOA: 01/10/2024  PCP: Pcp, No  Admit date: 01/10/2024  Discharge date: 01/11/2024  Discharge Diagnoses:  Principal Problem:   Vaso-occlusive sickle cell crisis (HCC) Active Problems:   Leukocytosis   Anemia of chronic disease   Chronic pain syndrome   Discharge Condition: Stable  Disposition:  Pt is discharged home in good condition and is to follow up with Pcp, No this week to have labs evaluated. Sandra Mckay is instructed to increase activity slowly and balance with rest for the next few days, and use prescribed medication to complete treatment of pain  Diet: Regular Wt Readings from Last 3 Encounters:  01/10/24 59 kg  10/01/23 59 kg  09/16/23 59 kg    History of present illness:  Sandra Mckay is a 28 y.o. 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 Reports recent move out to [facility], but no hematologist or PCP in that area she has no access to infusion clinic which has been helpful. She attributes worsening pain to stressors from relocation, and her menstrual cycle and would like to start on OCP to control this.   ED Course:  Patient was treated in the emergency department with IVF , IV pain medication with no resolution to symptoms, she was admitted in-patient for ongoing sickle cell pain management.  BP 110/62  Pulse 78  Temp 98.3 F (36.8 C) (Oral)  Resp 18  Ht 5\' 2"  (1.575 m)  Wt 59 kg  SpO2 99%  BMI 23.78 kg/m  Total Bilirubin 1.7 (*)       All other components within normal limits  CBC WITH DIFFERENTIAL/PLATELET - Abnormal; Notable for the following components:    RBC 1.94 (*)      Hemoglobin 6.9 (*)      HCT 20.1 (*)      MCV 103.6 (*)      MCH 35.6 (*)      RDW 22.4 (*)      nRBC 0.3 (*)      Lymphs Abs 4.5 (*)      Monocytes  Absolute 1.5 (*)      All other components within normal limits  RETICULOCYTES - Abnormal; Notable for the following components:    Retic Ct Pct 10.8 (*)      RBC. 1.96 (*)      Retic Count, Absolute 210.7 (*)      Immature Retic Fract 26.4      Hospital Course:  Patient was admitted for sickle cell pain crisis and hypotension. She was transferred to  step down ICU for continues monitoring of hypotension. Patient was later stabilized and transferred to a regular medical surgical unit after 12 hours. managed appropriately with IVF, IV Dilaudid  via PCA and IV Toradol , as well as other adjunct therapies per sickle cell pain management protocols. Patient is reporting significant pain improvement from 10/10 to 5/10, she is ambulating without assistance and asked to be discharged home. Patient was therefore discharged home today in a hemodynamically stable condition.  Sandra Mckay was counseled extensively about nonpharmacologic means of pain management, patient verbalized understanding and was appreciative of  the care received during this admission.   We discussed the need for good hydration, monitoring of hydration status, avoidance of heat, cold, stress, and infection triggers. We discussed the need to be adherent with taking other home medications.  Patient was reminded of the need to seek medical attention immediately if any symptom of bleeding, anemia, or infection occurs.  Discharge Exam: Vitals:   01/11/24 0541 01/11/24 0804  BP: 103/68   Pulse: 62   Resp: 10 14  Temp: 98.2 F (36.8 C)   SpO2: 100% 98%   Vitals:   01/11/24 0100 01/11/24 0338 01/11/24 0541 01/11/24 0804  BP: 97/62  103/68   Pulse: 61  62   Resp:  14 10 14   Temp: 98.7 F (37.1 C)  98.2 F (36.8 C)   TempSrc: Oral  Oral   SpO2: 97%  100% 98%  Weight:      Height:        General appearance : Awake, alert, not in any distress. Speech Clear. Not toxic looking HEENT: Atraumatic and Normocephalic, pupils equally  reactive to light and accomodation Neck: Supple, no JVD. No cervical lymphadenopathy.  Chest: Good air entry bilaterally, no added sounds  CVS: S1 S2 regular, no murmurs.  Abdomen: Bowel sounds present, Non tender and not distended with no gaurding, rigidity or rebound. Extremities: B/L Lower Ext shows no edema, both legs are warm to touch Neurology: Awake alert, and oriented X 3, CN II-XII intact, Non focal Skin: No Rash  Discharge Instructions  Discharge Instructions     Call MD for:  severe uncontrolled pain   Complete by: As directed    Call MD for:  temperature >100.4   Complete by: As directed    Diet - low sodium heart healthy   Complete by: As directed    Increase activity slowly   Complete by: As directed    Increase activity slowly   Complete by: As directed         The results of significant diagnostics from this hospitalization (including imaging, microbiology, ancillary and laboratory) are listed below for reference.    Significant Diagnostic Studies: DG CHEST PORT 1 VIEW Result Date: 01/10/2024 CLINICAL DATA:  Sickle cell crisis.  Pain. EXAM: PORTABLE CHEST 1 VIEW COMPARISON:  05/26/2022 FINDINGS: Cardiopericardial silhouette is at upper limits of normal for size. The lungs are clear without focal pneumonia, edema, pneumothorax or pleural effusion. Port-A-Cath has been repositioned in the interval, with tip now overlying the low SVC level near the junction with the RA. No acute bony abnormality. IMPRESSION: 1. No active disease. 2. Interval repositioning of Port-A-Cath tip, now overlying the low SVC level near the junction with the RA. Electronically Signed   By: Donnal Fusi M.D.   On: 01/10/2024 05:41    Microbiology: Recent Results (from the past 240 hours)  MRSA Next Gen by PCR, Nasal     Status: None   Collection Time: 01/10/24  8:50 AM   Specimen: Nasal Mucosa; Nasal Swab  Result Value Ref Range Status   MRSA by PCR Next Gen NOT DETECTED NOT DETECTED Final     Comment: (NOTE) The GeneXpert MRSA Assay (FDA approved for NASAL specimens only), is one component of a comprehensive MRSA colonization surveillance program. It is not intended to diagnose MRSA infection nor to guide or monitor treatment for MRSA infections. Test performance is not FDA approved in patients less than 66 years old. Performed at [facility], [address], [place], [place] [address]      Labs: Basic Metabolic Panel: Recent Labs  Lab 01/10/24 0230 01/11/24 0542  NA 138 133*  K 3.6 3.5  CL 107 102  CO2 22 26  GLUCOSE 95 87  BUN  10 <5*  CREATININE 0.49 0.38*  CALCIUM 9.0 8.5*  MG  --  1.8  PHOS  --  4.0   Liver Function Tests: Recent Labs  Lab 01/10/24 0230  AST 24  ALT 16  ALKPHOS 80  BILITOT 1.7*  PROT 6.9  ALBUMIN 4.3   No results for input(s): "LIPASE", "AMYLASE" in the last 168 hours. No results for input(s): "AMMONIA" in the last 168 hours. CBC: Recent Labs  Lab 01/10/24 0230 01/11/24 0542  WBC 9.4 9.4  NEUTROABS 3.2  --   HGB 6.9* 6.9*  HCT 20.1* 20.6*  MCV 103.6* 104.6*  PLT 273 260   Cardiac Enzymes: No results for input(s): "CKTOTAL", "CKMB", "CKMBINDEX", "TROPONINI" in the last 168 hours. BNP: Invalid input(s): "POCBNP" CBG: No results for input(s): "GLUCAP" in the last 168 hours.  Time coordinating discharge: 50 minutes  Signed:  Lorel Roes NP   [facility] 01/11/2024, 2:40 PM

## 2024-01-11 NOTE — Care Management Obs Status (Signed)
 MEDICARE OBSERVATION STATUS NOTIFICATION   Patient Details  Name: Sandra Mckay MRN: [id number] Date of Birth: May 05, 1996   Medicare Observation Status Notification Given:  Yes    Loreda Rodriguez, RN 01/11/2024, 12:12 PM

## 2024-01-11 NOTE — Plan of Care (Signed)

## 2024-01-11 NOTE — Plan of Care (Signed)
  Problem: Education: Goal: Knowledge of vaso-occlusive preventative measures will improve Outcome: Adequate for Discharge Goal: Awareness of infection prevention will improve Outcome: Adequate for Discharge Goal: Awareness of signs and symptoms of anemia will improve Outcome: Adequate for Discharge Goal: Long-term complications will improve Outcome: Adequate for Discharge   Problem: Self-Care: Goal: Ability to incorporate actions that prevent/reduce pain crisis will improve Outcome: Adequate for Discharge   Problem: Bowel/Gastric: Goal: Gut motility will be maintained Outcome: Adequate for Discharge   Problem: Tissue Perfusion: Goal: Complications related to inadequate tissue perfusion will be avoided or minimized Outcome: Adequate for Discharge   Problem: Respiratory: Goal: Pulmonary complications will be avoided or minimized Outcome: Adequate for Discharge Goal: Acute Chest Syndrome will be identified early to prevent complications Outcome: Adequate for Discharge   Problem: Fluid Volume: Goal: Ability to maintain a balanced intake and output will improve Outcome: Adequate for Discharge   Problem: Sensory: Goal: Pain level will decrease with appropriate interventions Outcome: Adequate for Discharge   Problem: Health Behavior: Goal: Postive changes in compliance with treatment and prescription regimens will improve Outcome: Adequate for Discharge   Problem: Education: Goal: Knowledge of General Education information will improve Description: Including pain rating scale, medication(s)/side effects and non-pharmacologic comfort measures Outcome: Adequate for Discharge   Problem: Health Behavior/Discharge Planning: Goal: Ability to manage health-related needs will improve Outcome: Adequate for Discharge   Problem: Clinical Measurements: Goal: Ability to maintain clinical measurements within normal limits will improve Outcome: Adequate for Discharge Goal: Will remain  free from infection Outcome: Adequate for Discharge Goal: Diagnostic test results will improve Outcome: Adequate for Discharge Goal: Respiratory complications will improve Outcome: Adequate for Discharge Goal: Cardiovascular complication will be avoided Outcome: Adequate for Discharge   Problem: Activity: Goal: Risk for activity intolerance will decrease Outcome: Adequate for Discharge   Problem: Nutrition: Goal: Adequate nutrition will be maintained Outcome: Adequate for Discharge   Problem: Coping: Goal: Level of anxiety will decrease Outcome: Adequate for Discharge   Problem: Elimination: Goal: Will not experience complications related to bowel motility Outcome: Adequate for Discharge Goal: Will not experience complications related to urinary retention Outcome: Adequate for Discharge   Problem: Pain Managment: Goal: General experience of comfort will improve and/or be controlled Outcome: Adequate for Discharge   Problem: Safety: Goal: Ability to remain free from injury will improve Outcome: Adequate for Discharge   Problem: Skin Integrity: Goal: Risk for impaired skin integrity will decrease Outcome: Adequate for Discharge

## 2024-01-18 ENCOUNTER — Other Ambulatory Visit: Payer: Self-pay

## 2024-01-28 ENCOUNTER — Telehealth: Payer: Self-pay | Admitting: Nurse Practitioner

## 2024-01-28 NOTE — Telephone Encounter (Signed)
 Copied from CRM [id number]. Topic: Referral - Request for Referral >> Jan 28, 2024 11:39 AM Zebedee SAUNDERS wrote: Did the patient discuss referral with their provider in the last year? Yes (If No - schedule appointment) (If Yes - send message)  Appointment offered? Yes  Type of order/referral and detailed reason for visit:  Sickle Cell   Preference of office, provider, location: Hematologist in pt area  If referral order, have you been seen by this specialty before? No (If Yes, this issue or another issue? When? Where?  Can we respond through MyChart? No

## 2024-03-08 ENCOUNTER — Telehealth: Payer: Self-pay

## 2024-03-08 NOTE — Telephone Encounter (Signed)
 Lvm for pt to advise she could request her records on the my chart. I will send a list of doctor that can treat sickle cell in [place], [place]. [facility]

## 2024-03-25 NOTE — ED Provider Notes (Signed)
 [facility]  ED Provider Note  Sandra Mckay 27 y.o. female DOB: 1996-01-29 MRN: [id number] History   Chief Complaint  Patient presents with  . Pain    Pt states sickle cell crisis started Thursday, pain medication not working, states she tends to get flares around her period    Patient is a 28 year old female that presents to the Emergency Department for chief complaints of bodyaches.  Patient has history of sickle cell anemia, and chronic pain.  Patient states that she started her menstrual cycle which often triggers a sickle cell pain crisis for her.  She is having pain in most of her joints.  But she denies having any chest pain, shortness of breath or difficulty breathing.  She reports she has not had acute chest syndrome anytime recently last time she states was probably about 10 years ago.  Denies having any nausea or vomiting.   History provided by:  Patient Language interpreter used: No   Pain       Past Medical History:  Diagnosis Date  . Anemia   . Anxiety and depression 07/15/2018   Discussed risk of relapse in pregnancy and postpartum  Encouraged patient to reestablish care with mood center Mood stable currently without medications  EDS every trimester 08/11/2018 presents to triage in pain crisis but also significantly anxious and depressed. Desires initiation of medication and will [facility] with [facility]. Zoloft  25mg  ordered  Last Assessment & Plan:  Discussed r  . Bipolar and related disorder (*) 04/04/2017  . Chronic pain 12/21/2011  . Lumbar vertebral fracture (*)   . Patent foramen ovale (*) 12/21/2011  . Pulmonary hypertension (*)   . Sickle cell anemia (*)   . Sickle-cell disease, unspecified    History of Sickle Cell Anemia  . Tobacco abuse   . Vitamin D  deficiency 12/22/2011    Past Surgical History:  Procedure Laterality Date  . Cholecystectomy    . Eye surgery     left eye  . Other surgical history      History of Splenectomy  . Other surgical history     History of Cholecystectomy Laparoscopic  . Portacath placement Right 03/06/2022  . Splenectomy    . Tonsillectomy      Social History   Substance and Sexual Activity  Alcohol Use Yes   Comment: socially   Tobacco Use History[1] E-Cigarettes  . Vaping Use Never User   . Start Date    . Cartridges/Day    . Quit Date     Social History   Substance and Sexual Activity  Drug Use Yes  . Types: Marijuana   Comment: daily   Tetanus up to date?: No Immunizations Up to Date?: No   Allergies[2]  Home Medications   FOLIC ACID  1 MG TABLET    Take one tablet (1 mg dose) by mouth daily.   HYDROXYUREA  (HYDREA ) 500 MG CAPSULE    Take three capsules (1,500 mg dose) by mouth every morning.   NALOXONE  (NARCAN ) NASAL SPRAY (TAKE HOME PACK)    one spray by Intranasal route as needed for Opioid Reversal (Opioid Reversal) for up to 14 days. Call 911. Instill one spray in each nostril. Repeat every 3 minutes as needed if no or minimal response.   OXYCODONE  HCL (ROXICODONE ) 15 MG IMMEDIATE RELEASE TABLET    Take one tablet (15 mg dose) by mouth every 6 (six) hours as needed for Pain for up to 17 days. Max Daily  Amount: 60 mg   OXYCODONE  HCL ER (OXYCONTIN ) 30 MG T12A    Take one tablet (30 mg dose) by mouth every 12 (twelve) hours for 17 days. Max Daily Amount: 60 mg   QUETIAPINE  FUMARATE (SEROQUEL ) 25 MG TABLET    Take two tablets (50 mg dose) by mouth at bedtime for 30 days.   RIVAROXABAN  (XARELTO ) 20 MG TABS TABLET    Take one tablet (20 mg dose) by mouth every morning.   VITAMIN D3, CHOLECALCIFEROL, (OPTIMAL-D) 50,000 UNITS CAPS    Take one capsule by mouth once a week.    Primary Survey  Primary Survey  Review of Systems   Review of Systems  Constitutional:  Negative for fever.  Respiratory:  Negative for cough and shortness of breath.   Cardiovascular:  Negative for chest pain.  Gastrointestinal:  Negative for abdominal pain.   Musculoskeletal:  Positive for arthralgias and myalgias. Negative for back pain.  All other systems reviewed and are negative.   Physical Exam   ED Triage Vitals  BP 03/25/24 0547 109/72  Heart Rate 03/25/24 0547 100  Resp 03/25/24 0547 18  SpO2 03/25/24 0547 97 %  Temp 03/25/24 0621 98.5 F (36.9 C)    Physical Exam  Nursing note and vitals reviewed. Constitutional: She appears well-developed and well-nourished. She does not appear distressed, does not appear ill and no respiratory distress.  HENT:  Head: Normocephalic and atraumatic.  Right Ear: Normal external ear.  Left Ear: Normal external ear.  Nose: Nose normal.  Mouth/Throat: Voice normal.  Eyes: Pupils are equal, round, and reactive to light.  Neck: Normal range of motion and voice normal.  Cardiovascular: Normal rate, regular rhythm and intact distal pulses.  Pulmonary/Chest: No respiratory distress. Respiratory effort normal and breath sounds normal.  Abdominal: Soft. There is no abdominal tenderness. There is no guarding. Abdomen not distended. Bowel sounds are normal.  Musculoskeletal: Normal range of motion. No obvious deformity noted to extremities.     Cervical back: Normal range of motion. no edema.  Neurological: She is alert. She has normal speech. Sensation intact to light touch, bilateral upper and lower extremities. Strength 5/5 bilateral upper and lower extremities.  Skin: Skin is warm. Skin is dry.  Psychiatric: She has a normal mood and affect. Her behavior is normal.     ED Course   Lab results:   CBC AND DIFFERENTIAL - Abnormal      Result Value   WBC 9.6     RBC 2.03 (*)    HGB 7.0 (*)    HCT 20.4 (*)    MCV 100.5 (*)    MCH 34.5 (*)    MCHC 34.3     Plt Ct 387     RDW SD 74.5 (*)    MPV 9.3 (*)    NRBC% 0.4 (*)    Absolute NRBC Count 0.04 (*)    NEUTROPHIL % 35.2     LYMPHOCYTE % 41.4     MONOCYTE % 21.7     Eosinophil % 0.3     BASOPHIL % 1.0     IG% 0.4     ABSOLUTE  NEUTROPHIL COUNT 3.38     ABSOLUTE LYMPHOCYTE COUNT 3.97     Absolute Monocyte Count 2.08 (*)    Absolute Eosinophil Count 0.03     Absolute Basophil Count 0.10     Absolute Immature Granulocyte Count 0.04 (*)   BASIC METABOLIC PANEL - Abnormal   Na 137  Potassium 3.7     Cl 103     CO2 22     AGAP 12     Glucose 100 (*)    BUN 9     Creatinine 0.29 (*)    Ca 9.8     BUN/CREAT RATIO 31.0 (*)    eGFR 148     Comment: Normal GFR (glomerular filtration rate) > 60 mL/min/1.73 meters squared, < 60 may include impaired kidney function. Calculation based on the Chronic Kidney Disease Epidemiology Collaboration (CK-EPI)equation refit without adjustment for race.  RETICULOCYTE COUNT AUTOMATED - Abnormal   Reticulocyte %  14.95 (*)    Immature Reticulocyte Fraction  29.2 (*)    Reticulocyte Hemoglobin  35.5     Absolute Reticulocyte Count 0.3035 (*)   LIGHT BLUE TOP  GOLD SST    Imaging:   XR CHEST AP PORTABLE   Narrative:    EXAM: XR CHEST AP PORTABLE  INDICATION: Weakness  COMPARISON: 03/13/2024  FINDINGS:  Right chest wall infusion port with tip near the superior cavoatrial junction. Stable heart size. No focal consolidation. No substantial pleural effusion or pneumothorax. No acute osseous abnormality.    Impression:    IMPRESSION:  NO RADIOGRAPHIC EVIDENCE OF ACUTE CARDIOPULMONARY DISEASE.  Electronically Signed by: Victoria Arendt Kosec on 03/25/2024 6:24 AM     ECG: ECG Results   None                                                                        Pre-Sedation Procedures    Medical Decision Making Patient seen and examined, vital signs reviewed, on exam patient appeared to be in pain but was in no acute distress, not complaining of any chest pain, blood work obtained demonstrated anemia which is chronic for this patient, but she was at 7, and her reticulocyte count was elevated which was reassuring, chest  x-ray was negative, chemistry unremarkable.  Patient given IV fluids, Dilaudid , and Zofran , she was requesting additional dose of pain medication.  She states she has an appointment later today, which she got a prescription for oral pain medication.  She was given return precautions and ultimately discharged in stable condition.  Amount and/or Complexity of Data Reviewed Labs: ordered. Radiology: ordered.  Risk Prescription drug management.             Provider Communication  New Prescriptions   No medications on file    Modified Medications   No medications on file    Discontinued Medications   No medications on file    Clinical Impression Final diagnoses:  Sickle cell pain crisis (*)    ED Disposition     ED Disposition  Discharge   Condition  Stable   Comment  --                 Follow-up Information     Go to  [facility].   Specialty: Emergency Medicine Comments: If symptoms worsen Contact information: [address] [place]  [id number] [phone] Additional information: When you arrive, our care team of nurses or providers will be ready to help you. Pre-registering, while not a scheduled appointment, helps us  prepare for your visit, ensuring a smoother and faster evaluation process.  Electronically signed by:       [1] Social History Tobacco Use  Smoking Status Some Days  . Current packs/day: 0.50  . Average packs/day: 0.5 packs/day for 11.6 years (5.8 ttl pk-yrs)  . Types: Cigars, Cigarettes  . Start date: 2014  . Passive exposure: Current  Smokeless Tobacco Never  Tobacco Comments   smokes black and milds  [2] Allergies Allergen Reactions  . Latex Rash  . Morphine  Hives  . Other Rash    Latex Tape  . Pedi-Pre Tape Spray [Wound Dressing Adhesive] Rash   Devin C Hale, DO 03/25/24 [id number]

## 2024-03-29 ENCOUNTER — Telehealth (HOSPITAL_COMMUNITY): Payer: Self-pay | Admitting: *Deleted

## 2024-03-29 ENCOUNTER — Non-Acute Institutional Stay (HOSPITAL_COMMUNITY)
Admission: AD | Admit: 2024-03-29 | Discharge: 2024-03-29 | Disposition: A | Discharge status: Home / Self Care | Source: Ambulatory Visit | Attending: Internal Medicine | Admitting: Internal Medicine

## 2024-03-29 DIAGNOSIS — D5700 Hb-SS disease with crisis, unspecified: Secondary | ICD-10-CM | POA: Insufficient documentation

## 2024-03-29 DIAGNOSIS — F1120 Opioid dependence, uncomplicated: Secondary | ICD-10-CM | POA: Diagnosis not present

## 2024-03-29 DIAGNOSIS — Z7901 Long term (current) use of anticoagulants: Secondary | ICD-10-CM | POA: Diagnosis not present

## 2024-03-29 DIAGNOSIS — Z86711 Personal history of pulmonary embolism: Secondary | ICD-10-CM | POA: Diagnosis not present

## 2024-03-29 LAB — COMPREHENSIVE METABOLIC PANEL WITH GFR
ALT: 35 U/L (ref 0–44)
AST: 43 U/L — ABNORMAL HIGH (ref 15–41)
Albumin: 4.2 g/dL (ref 3.5–5.0)
Alkaline Phosphatase: 113 U/L (ref 38–126)
Anion gap: 12 (ref 5–15)
BUN: 11 mg/dL (ref 6–20)
CO2: 22 mmol/L (ref 22–32)
Calcium: 9.2 mg/dL (ref 8.9–10.3)
Chloride: 108 mmol/L (ref 98–111)
Creatinine, Ser: 0.45 mg/dL (ref 0.44–1.00)
GFR, Estimated: >60 mL/min (ref >60)
Glucose, Bld: 83 mg/dL (ref 70–99)
Potassium: 3.8 mmol/L (ref 3.5–5.1)
Sodium: 142 mmol/L (ref 135–145)
Total Bilirubin: 1.5 mg/dL — ABNORMAL HIGH (ref 0.0–1.2)
Total Protein: 6.4 g/dL — ABNORMAL LOW (ref 6.5–8.1)

## 2024-03-29 LAB — CBC WITH DIFFERENTIAL/PLATELET
Abs Granulocyte: 4.4 K/uL (ref 1.5–6.5)
Abs Immature Granulocytes: 0.04 K/uL (ref 0.00–0.07)
Basophils Absolute: 0.1 K/uL (ref 0.0–0.1)
Basophils Relative: 1 %
Eosinophils Absolute: 0.1 K/uL (ref 0.0–0.5)
Eosinophils Relative: 1 %
HCT: 23 % — ABNORMAL LOW (ref 36.0–46.0)
Hemoglobin: 7.6 g/dL — ABNORMAL LOW (ref 12.0–15.0)
Immature Granulocytes: 1 %
Lymphocytes Relative: 30 %
Lymphs Abs: 2.7 K/uL (ref 0.7–4.0)
MCH: 36.5 pg — ABNORMAL HIGH (ref 26.0–34.0)
MCHC: 33 g/dL (ref 30.0–36.0)
MCV: 110.6 fL — ABNORMAL HIGH (ref 80.0–100.0)
Monocytes Absolute: 1.6 K/uL — ABNORMAL HIGH (ref 0.1–1.0)
Monocytes Relative: 18 %
Neutro Abs: 4.4 K/uL (ref 1.7–7.7)
Neutrophils Relative %: 49 %
Platelets: 364 K/uL (ref 150–400)
RBC: 2.08 MIL/uL — ABNORMAL LOW (ref 3.87–5.11)
RDW: 23.2 % — ABNORMAL HIGH (ref 11.5–15.5)
Smear Review: NORMAL
WBC: 8.8 K/uL (ref 4.0–10.5)
nRBC: 2.6 % — ABNORMAL HIGH (ref 0.0–0.2)

## 2024-03-29 LAB — LACTATE DEHYDROGENASE: LDH: 331 U/L — ABNORMAL HIGH (ref 98–192)

## 2024-03-29 LAB — RETICULOCYTES
Immature Retic Fract: 42 % — ABNORMAL HIGH (ref 2.3–15.9)
RBC.: 2.06 MIL/uL — ABNORMAL LOW (ref 3.87–5.11)
Retic Count, Absolute: 410.4 K/uL — ABNORMAL HIGH (ref 19.0–186.0)
Retic Ct Pct: 19.9 % — ABNORMAL HIGH (ref 0.4–3.1)

## 2024-03-29 MED ORDER — NALOXONE HCL 0.4 MG/ML IJ SOLN: 0.4000 mg | INTRAMUSCULAR | Status: DC | PRN

## 2024-03-29 MED ORDER — SENNOSIDES-DOCUSATE SODIUM 8.6-50 MG PO TABS: 1.0000 | ORAL_TABLET | Freq: Two times a day (BID) | ORAL | Status: DC

## 2024-03-29 MED ORDER — DIPHENHYDRAMINE HCL 25 MG PO CAPS: 25.0000 mg | ORAL_CAPSULE | ORAL | Status: DC | PRN

## 2024-03-29 MED ORDER — HEPARIN SOD (PORK) LOCK FLUSH 100 UNIT/ML IV SOLN
500.0000 [IU] | INTRAVENOUS | Status: AC | PRN
  Administered 2024-03-29: 500 [IU]
  Filled 2024-03-29: qty 5

## 2024-03-29 MED ORDER — POLYETHYLENE GLYCOL 3350 17 G PO PACK: 17.0000 g | PACK | Freq: Every day | ORAL | Status: DC | PRN

## 2024-03-29 MED ORDER — HYDROMORPHONE 1 MG/ML IV SOLN
INTRAVENOUS | Status: DC
  Administered 2024-03-29: 15 mg via INTRAVENOUS
  Administered 2024-03-29: 30 mg via INTRAVENOUS
  Filled 2024-03-29: qty 30

## 2024-03-29 MED ORDER — SODIUM CHLORIDE 0.45 % IV SOLN: INTRAVENOUS | Status: DC

## 2024-03-29 MED ORDER — ACETAMINOPHEN 500 MG PO TABS
1000.0000 mg | ORAL_TABLET | Freq: Once | ORAL | Status: AC
  Administered 2024-03-29: 1000 mg via ORAL
  Filled 2024-03-29: qty 2

## 2024-03-29 MED ORDER — SODIUM CHLORIDE 0.9% FLUSH
10.0000 mL | INTRAVENOUS | Status: AC | PRN
  Administered 2024-03-29: 10 mL

## 2024-03-29 MED ORDER — SODIUM CHLORIDE 0.9% FLUSH: 9.0000 mL | INTRAVENOUS | Status: DC | PRN

## 2024-03-29 MED ORDER — ONDANSETRON HCL 4 MG/2ML IJ SOLN
4.0000 mg | Freq: Four times a day (QID) | INTRAMUSCULAR | Status: DC | PRN
  Administered 2024-03-29: 4 mg via INTRAVENOUS
  Filled 2024-03-29: qty 2

## 2024-03-29 NOTE — Progress Notes (Signed)
 Patient admitted to the day hospital for treatment of sickle cell pain crisis. Patient reported generalized pain rated 8/10. Patient placed on Dilaudid  PCA, given IV zofran , PO Tylenol  and hydrated with IV fluids. At discharge patient reported  pain at 5/10. Declined printed discharge instructions. Alert, oriented and ambulatory at discharge.

## 2024-03-29 NOTE — H&P (Signed)
 [facility] History and Physical  Sahar Ryback FMW:[id number] DOB: 05/04/1996 DOA: 03/29/2024  PCP: Pcp, No   Chief Complaint:  Chief Complaint  Patient presents with   Sickle Cell Pain Crisis    HPI: Sandra Mckay is a 28 y.o. female with history of sickle cell disease, opiate dependence, PE on AC, mood d/o, who presents with lower back and bilateral leg pain rated 8/10. Reports taking Oxycontin  30 mg last night but reports being out of Oxycodone . Admits to some nausea but denies vomiting, fever, chest pain, diarrhea and abdominal pain. No urinary symptoms  Systemic Review: General: The patient denies anorexia, fever, weight loss Cardiac: Denies chest pain, syncope, palpitations, pedal edema  Respiratory: Denies cough, shortness of breath, wheezing GI: Denies severe indigestion/heartburn, abdominal pain, nausea, vomiting, diarrhea and constipation GU: Denies hematuria, incontinence, dysuria  Musculoskeletal: Denies arthritis  Skin: Denies suspicious skin lesions Neurologic: Denies focal weakness or numbness, change in vision  Past Medical History:  Diagnosis Date   Acute cystitis without hematuria 10/21/2019   Blood transfusion without reported diagnosis    Bronchitis    Chickenpox    Depression    Elevated ferritin level 05/2019   Heart murmur    Nausea without vomiting 09/09/2015   Pulmonary hypertension (HCC)    Sickle cell anemia (HCC)    Sickle cell disease, type SS (HCC)    Sickle cell pain crisis (HCC) 12/05/2016   Thrombocytosis 05/2019   Urinary tract infection    Vitamin D  deficiency     Past Surgical History:  Procedure Laterality Date   CHOLECYSTECTOMY  2011   EYE SURGERY     Sty removal   IR IMAGING GUIDED PORT INSERTION  03/06/2022   IR IMAGING GUIDED PORT INSERTION  09/16/2023   IR REMOVAL TUN ACCESS W/ PORT W/O FL MOD SED  05/29/2022   LABIAL ADHESION LYSIS  1999   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 Authorizing Provider  hydroxyurea  (HYDREA ) 500 MG capsule Take 500 mg by mouth 3 (three) times daily.    [provider]  ondansetron  (ZOFRAN ) 4 MG tablet Take 1 tablet (4 mg total) by mouth every 8 (eight) hours as needed for nausea or vomiting. 10/28/23   Cherylene Homer HERO, NP  oxyCODONE  (ROXICODONE ) 15 MG immediate release tablet Take 15 mg by mouth every 4 (four) hours as needed for pain.    [provider]  oxyCODONE  30 MG 12 hr tablet Take 30 mg by mouth 2 (two) times daily as needed (Pain).    [provider]  QUEtiapine  (SEROQUEL ) 100 MG tablet Take 100 mg by mouth at bedtime. Patient not taking: Reported on 01/10/2024    [provider]  XARELTO  20 MG TABS tablet TAKE 1 TABLET BY MOUTH ONCE DAILY WITH SUPPER 06/30/23   [name], Olugbemiga E, MD     Physical Exam: Vitals:  03/29/24 0946 03/29/24 1006 03/29/24 1210  BP: 108/66  101/63  Pulse: 76  73  Resp: 14 14 10   Temp: 98.6 F (37 C)    TempSrc: Temporal    SpO2: 96%  97%    General: Alert, awake, afebrile, anicteric, not in obvious distress HEENT: Normocephalic and Atraumatic, Mucous membranes pink                PERRLA; EOM intact; No scleral icterus,                 Nares: Patent, Oropharynx: Clear, Fair Dentition                 Neck: FROM, no cervical lymphadenopathy, thyromegaly, carotid bruit or JVD;  CHEST WALL: No tenderness  CHEST: Normal respiration, clear to auscultation bilaterally  HEART: Regular rate and rhythm; no murmurs rubs or gallops  BACK:Generalize body and back tenderness   ABDOMEN: Positive Bowel Sounds, soft, non-tender; no masses, no organomegaly EXTREMITIES: No cyanosis, clubbing, or edema SKIN:  no rash or ulceration  CNS: Alert and Oriented x 4, Nonfocal exam, CN 2-12 intact  Labs on Admission:  Basic Metabolic Panel: No results for input(s): NA, K, CL, CO2, GLUCOSE, BUN, CREATININE, CALCIUM, MG, PHOS in the last 168 hours. Liver Function Tests: No results for input(s): AST, ALT, ALKPHOS, BILITOT, PROT, ALBUMIN in the last 168 hours. No results for input(s): LIPASE, AMYLASE in the last 168 hours. No results for input(s): AMMONIA in the last 168 hours. CBC: Recent Labs  Lab 03/29/24 1009  WBC 8.8  NEUTROABS PENDING  HGB 7.6*  HCT 23.0*  MCV 110.6*  PLT 364   Cardiac Enzymes: No results for input(s): CKTOTAL, CKMB, CKMBINDEX, TROPONINI in the last 168 hours.  BNP (last 3 results) No results for input(s): BNP in the last 8760 hours.  ProBNP (last 3 results) No results for input(s): PROBNP in the last 8760 hours.  CBG: No results for input(s): GLUCAP in the last 168 hours.   Assessment/Plan Principal Problem:   Sickle cell anemia with pain (HCC)  Admits to the Day Hospital for extended observation IVF 0 .45% Saline @ 125 mls/hour Weight based Dilaudid  PCA started within 30 minutes of admission IV Toradol  15 mg X 1 doses Acetaminophen  1000 mg x 1 dose Labs: CBCD, CMP, Retic Count and LDH Monitor vitals very closely, Re-evaluate pain scale every hour 2 L of Oxygen  by [place] Patient will be re-evaluated for pain in the context of function and relationship to baseline as care progresses. If no significant relieve from pain (remains above 5/10) will transfer patient to inpatient services for further evaluation and management  Code Status: Full  Family Communication: None  DVT Prophylaxis: Ambulate as tolerated   Time spent: 35 Minutes  Homer CHRISTELLA Cover NP   If 7PM-7AM, please  contact night-coverage [email or web address] 03/29/2024, 12:28 PM

## 2024-03-29 NOTE — Telephone Encounter (Signed)
 Patient called requesting to come to the day hospital for sickle cell pain. Patient reports lower back and bilateral leg pain rated 8/10. Reports taking Oxycontin  30 mg last night but reports being out of Oxycodone . Admits to some nausea but denies vomiting, fever, chest pain, diarrhea and abdominal pain. Admits to having transportation without driving self. Homer, NP notified. Patient can come to the day hospital for pain management. Patient advised and expresses an understanding.

## 2024-03-29 NOTE — Discharge Summary (Signed)
 Physician Discharge Summary  Sandra Mckay DOB: 1996-03-26 DOA: 03/29/2024  PCP: Pcp, No  Admit date: 03/29/2024  Discharge date: 03/29/2024  Time spent: 30 minutes  Discharge Diagnoses:  Principal Problem:   Sickle cell anemia with pain [facility])   Discharge Condition: Stable  Diet recommendation: Regular  History of present illness:  Sandra Mckay is a 28 y.o. female with history of sickle cell disease, opiate dependence, PE on AC, mood d/o, who presents with lower back and bilateral leg pain rated 8/10. Reports taking Oxycontin  30 mg last night but reports being out of Oxycodone . Admits to some nausea but denies vomiting, fever, chest pain, diarrhea and abdominal pain. No urinary symptoms   Hospital Course:  Sandra Mckay was admitted to the day hospital with sickle cell painful crisis. Patient was treated with IV fluid, weight based IV Dilaudid  PCA, clinician assisted doses as deemed appropriate, and other adjunct therapies per sickle cell pain management protocol. Sandra Mckay showed significant improvement symptomatically, pain improved from 8/10 to 4/10 at the time of discharge. Patient was discharged home in a hemodynamically stable condition. Sandra Mckay will follow-up at the clinic as previously scheduled, continue with home medications as per prior to admission.  Discharge Instructions We discussed the need for good hydration, monitoring of hydration status, avoidance of heat, cold, stress, and infection triggers. We discussed the need to be compliant with taking home medications. Sandra Mckay was reminded of the need to seek medical attention immediately if any symptom of bleeding, anemia, or infection occurs.  Discharge Exam: Vitals:   03/29/24 1210 03/29/24 1430  BP: 101/63 103/64  Pulse: 73 72  Resp: 10 13  Temp:    SpO2: 97% 98%    General appearance: alert, cooperative and no distress Eyes: conjunctivae/corneas clear. PERRL, EOM's intact. Fundi benign. Neck:  no adenopathy, no carotid bruit, no JVD, supple, symmetrical, trachea midline and thyroid not enlarged, symmetric, no tenderness/mass/nodules Back: symmetric, no curvature. ROM normal. No CVA tenderness. Resp: clear to auscultation bilaterally Chest wall: no tenderness Cardio: regular rate and rhythm, S1, S2 normal, no murmur, click, rub or gallop GI: soft, non-tender; bowel sounds normal; no masses,  no organomegaly Extremities: extremities normal, atraumatic, no cyanosis or edema Pulses: 2+ and symmetric Skin: Skin color, texture, turgor normal. No rashes or lesions Neurologic: Grossly normal  Discharge Instructions     Call MD for:  severe uncontrolled pain   Complete by: As directed    Call MD for:  temperature >100.4   Complete by: As directed    Diet - low sodium heart healthy   Complete by: As directed    Increase activity slowly   Complete by: As directed       Allergies as of 03/29/2024       Reactions   Latex Rash   Wound Dressing Adhesive Rash        Medication List     TAKE these medications    hydroxyurea  500 MG capsule Commonly known as: HYDREA  Take 500 mg by mouth 3 (three) times daily.   ondansetron  4 MG tablet Commonly known as: Zofran  Take 1 tablet (4 mg total) by mouth every 8 (eight) hours as needed for nausea or vomiting.   oxyCODONE  15 MG immediate release tablet Commonly known as: ROXICODONE  Take 15 mg by mouth every 4 (four) hours as needed for pain.   oxyCODONE  30 MG 12 hr tablet Take 30 mg by mouth 2 (two) times daily as needed (Pain).   QUEtiapine  100 MG tablet Commonly  known as: SEROQUEL  Take 100 mg by mouth at bedtime.   Xarelto  20 MG Tabs tablet Generic drug: rivaroxaban  TAKE 1 TABLET BY MOUTH ONCE DAILY WITH SUPPER       Allergies  Allergen Reactions   Latex Rash   Wound Dressing Adhesive Rash     Significant Diagnostic Studies: No results found.  Signed:  Homer CHRISTELLA Cover NP   03/29/2024, 3:12 PM

## 2024-05-25 ENCOUNTER — Non-Acute Institutional Stay (HOSPITAL_COMMUNITY)
Admission: AD | Admit: 2024-05-25 | Discharge: 2024-05-25 | Disposition: A | Discharge status: Home / Self Care | Source: Ambulatory Visit | Attending: Internal Medicine | Admitting: Internal Medicine

## 2024-05-25 ENCOUNTER — Telehealth (HOSPITAL_COMMUNITY): Payer: Self-pay | Admitting: *Deleted

## 2024-05-25 DIAGNOSIS — F1120 Opioid dependence, uncomplicated: Secondary | ICD-10-CM | POA: Insufficient documentation

## 2024-05-25 DIAGNOSIS — D5700 Hb-SS disease with crisis, unspecified: Secondary | ICD-10-CM | POA: Insufficient documentation

## 2024-05-25 DIAGNOSIS — Z86711 Personal history of pulmonary embolism: Secondary | ICD-10-CM | POA: Insufficient documentation

## 2024-05-25 LAB — CBC WITH DIFFERENTIAL/PLATELET
Abs Immature Granulocytes: 0.06 K/uL (ref 0.00–0.07)
Basophils Absolute: 0.1 K/uL (ref 0.0–0.1)
Basophils Relative: 1 %
Eosinophils Absolute: 0.1 K/uL (ref 0.0–0.5)
Eosinophils Relative: 1 %
HCT: 24.1 % — ABNORMAL LOW (ref 36.0–46.0)
Hemoglobin: 7.7 g/dL — ABNORMAL LOW (ref 12.0–15.0)
Immature Granulocytes: 1 %
Lymphocytes Relative: 18 %
Lymphs Abs: 2.2 K/uL (ref 0.7–4.0)
MCH: 32.5 pg (ref 26.0–34.0)
MCHC: 32 g/dL (ref 30.0–36.0)
MCV: 101.7 fL — ABNORMAL HIGH (ref 80.0–100.0)
Monocytes Absolute: 1.6 K/uL — ABNORMAL HIGH (ref 0.1–1.0)
Monocytes Relative: 13 %
Neutro Abs: 8.4 K/uL — ABNORMAL HIGH (ref 1.7–7.7)
Neutrophils Relative %: 66 %
Platelets: 359 K/uL (ref 150–400)
RBC: 2.37 MIL/uL — ABNORMAL LOW (ref 3.87–5.11)
RDW: 22.4 % — ABNORMAL HIGH (ref 11.5–15.5)
Smear Review: NORMAL
WBC: 12.3 K/uL — ABNORMAL HIGH (ref 4.0–10.5)
nRBC: 0.3 % — ABNORMAL HIGH (ref 0.0–0.2)

## 2024-05-25 LAB — LACTATE DEHYDROGENASE: LDH: 376 U/L — ABNORMAL HIGH (ref 98–192)

## 2024-05-25 LAB — COMPREHENSIVE METABOLIC PANEL WITH GFR
ALT: 70 U/L — ABNORMAL HIGH (ref 0–44)
AST: 74 U/L — ABNORMAL HIGH (ref 15–41)
Albumin: 4.5 g/dL (ref 3.5–5.0)
Alkaline Phosphatase: 128 U/L — ABNORMAL HIGH (ref 38–126)
Anion gap: 11 (ref 5–15)
BUN: 9 mg/dL (ref 6–20)
CO2: 23 mmol/L (ref 22–32)
Calcium: 9.4 mg/dL (ref 8.9–10.3)
Chloride: 109 mmol/L (ref 98–111)
Creatinine, Ser: 0.46 mg/dL (ref 0.44–1.00)
GFR, Estimated: >60 mL/min (ref >60)
Glucose, Bld: 84 mg/dL (ref 70–99)
Potassium: 4.1 mmol/L (ref 3.5–5.1)
Sodium: 142 mmol/L (ref 135–145)
Total Bilirubin: 1.4 mg/dL — ABNORMAL HIGH (ref 0.0–1.2)
Total Protein: 7.2 g/dL (ref 6.5–8.1)

## 2024-05-25 LAB — RETICULOCYTES
Immature Retic Fract: 29 % — ABNORMAL HIGH (ref 2.3–15.9)
RBC.: 2.32 MIL/uL — ABNORMAL LOW (ref 3.87–5.11)
Retic Count, Absolute: 296.3 K/uL — ABNORMAL HIGH (ref 19.0–186.0)
Retic Ct Pct: 12.8 % — ABNORMAL HIGH (ref 0.4–3.1)

## 2024-05-25 MED ORDER — ACETAMINOPHEN 500 MG PO TABS
1000.0000 mg | ORAL_TABLET | Freq: Once | ORAL | Status: AC
  Administered 2024-05-25: 1000 mg via ORAL
  Filled 2024-05-25: qty 2

## 2024-05-25 MED ORDER — HEPARIN SOD (PORK) LOCK FLUSH 100 UNIT/ML IV SOLN
500.0000 [IU] | INTRAVENOUS | Status: AC | PRN
  Administered 2024-05-25: 500 [IU]

## 2024-05-25 MED ORDER — DIPHENHYDRAMINE HCL 25 MG PO CAPS
25.0000 mg | ORAL_CAPSULE | ORAL | Status: DC | PRN
  Administered 2024-05-25: 25 mg via ORAL
  Filled 2024-05-25: qty 1

## 2024-05-25 MED ORDER — POLYETHYLENE GLYCOL 3350 17 G PO PACK: 17.0000 g | PACK | Freq: Every day | ORAL | Status: DC | PRN

## 2024-05-25 MED ORDER — ONDANSETRON HCL 4 MG/2ML IJ SOLN
4.0000 mg | Freq: Four times a day (QID) | INTRAMUSCULAR | Status: DC | PRN
  Administered 2024-05-25: 4 mg via INTRAVENOUS
  Filled 2024-05-25: qty 2

## 2024-05-25 MED ORDER — NALOXONE HCL 0.4 MG/ML IJ SOLN: 0.4000 mg | INTRAMUSCULAR | Status: DC | PRN

## 2024-05-25 MED ORDER — SENNOSIDES-DOCUSATE SODIUM 8.6-50 MG PO TABS: 1.0000 | ORAL_TABLET | Freq: Two times a day (BID) | ORAL | Status: DC

## 2024-05-25 MED ORDER — SODIUM CHLORIDE 0.9% FLUSH: 9.0000 mL | INTRAVENOUS | Status: DC | PRN

## 2024-05-25 MED ORDER — SODIUM CHLORIDE 0.45 % IV SOLN: INTRAVENOUS | Status: DC

## 2024-05-25 MED ORDER — SODIUM CHLORIDE 0.9% FLUSH
10.0000 mL | INTRAVENOUS | Status: AC | PRN
  Administered 2024-05-25: 10 mL

## 2024-05-25 MED ORDER — HYDROMORPHONE 1 MG/ML IV SOLN
INTRAVENOUS | Status: DC
  Administered 2024-05-25: 30 mg via INTRAVENOUS
  Administered 2024-05-25: 16 mg via INTRAVENOUS
  Filled 2024-05-25: qty 30

## 2024-05-25 NOTE — Telephone Encounter (Signed)
 Patient called requesting to come to the day hospital for sickle cell pain. Patient reports bilateral leg and hip pain rated 9/10. Reports taking Oxycodone  15 mg at 1:00 am. Denies fever, chest pain, nausea, vomiting, diarrhea and abdominal pain. Admits to having transportation without driving self. Onyeje, NP notified and advised that patient can come to the day hospital for pain management. Patient advised and expresses an understanding.

## 2024-05-25 NOTE — Discharge Summary (Signed)
 Physician Discharge Summary  Sandra Mckay FMW:[id number] DOB: 04-04-1996 DOA: 05/25/2024  PCP: Pcp, No  Admit date: 05/25/2024  Discharge date: 05/25/2024  Time spent: 30 minutes  Discharge Diagnoses:  Principal Problem:   Sickle cell anemia with pain [facility])   Discharge Condition: Stable  Diet recommendation: Regular  History of present illness:  Sandra Mckay is a 28 y.o. 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 Reports taking Oxycontin  30 mg last night without resolution to pain.  Denies nausea, vomiting, fever, chest pain, diarrhea and abdominal pain. No urinary symptoms    Hospital Course:  Sandra Mckay was admitted to the day hospital with sickle cell painful crisis. Patient was treated with IV fluid, weight based IV Dilaudid  PCA. Sandra Mckay showed improvement symptomatically, pain improved from 9/10 to 5/10 at the time of discharge. Patient was discharged home in a hemodynamically stable condition. Sandra Mckay will follow-up at the clinic as previously scheduled, continue with home medications as per prior to admission.  Discharge Instructions We discussed the need for good hydration, monitoring of hydration status, avoidance of heat, cold, stress, and infection triggers. We discussed the need to be compliant with taking her home medications. Sandra Mckay was reminded of the need to seek medical attention immediately if any symptom of bleeding, anemia, or infection occurs.  Discharge Exam: Vitals:   05/25/24 0935 05/25/24 1129  BP:  108/61  Pulse:  74  Resp: 13 14  Temp:  98.4 F (36.9 C)  SpO2:  98%    General appearance: alert, cooperative and no distress Eyes: conjunctivae/corneas clear. PERRL, EOM's intact. Fundi benign. Neck: no adenopathy, no carotid bruit, no JVD, supple, symmetrical, trachea midline and thyroid not enlarged, symmetric, no  tenderness/mass/nodules Back: symmetric, no curvature. ROM normal. No CVA tenderness. Resp: clear to auscultation bilaterally Chest wall: no tenderness Cardio: regular rate and rhythm, S1, S2 normal, no murmur, click, rub or gallop GI: soft, non-tender; bowel sounds normal; no masses,  no organomegaly Extremities: extremities normal, atraumatic, no cyanosis or edema Pulses: 2+ and symmetric Skin: Skin color, texture, turgor normal. No rashes or lesions Neurologic: Grossly normal  Discharge Instructions     Call MD for:  persistant nausea and vomiting   Complete by: As directed    Call MD for:  temperature >100.4   Complete by: As directed    Diet - low sodium heart healthy   Complete by: As directed    Increase activity slowly   Complete by: As directed       Allergies as of 05/25/2024       Reactions   Latex Rash   Wound Dressing Adhesive Rash        Medication List     TAKE these medications    hydroxyurea  500 MG capsule Commonly known as: HYDREA  Take 500 mg by mouth 3 (three) times daily.   ondansetron  4 MG tablet Commonly known as: Zofran  Take 1 tablet (4 mg total) by mouth every 8 (eight) hours as needed for nausea or vomiting.   oxyCODONE  15 MG immediate release tablet Commonly known as: ROXICODONE  Take 15 mg by mouth every 4 (four) hours as needed for pain.   oxyCODONE  30 MG 12 hr tablet Take 30 mg by mouth 2 (two) times daily as needed (Pain).   QUEtiapine  100 MG tablet Commonly known as: SEROQUEL  Take 100 mg by mouth at bedtime.   Xarelto  20 MG Tabs  tablet Generic drug: rivaroxaban  TAKE 1 TABLET BY MOUTH ONCE DAILY WITH SUPPER       Allergies  Allergen Reactions   Latex Rash   Wound Dressing Adhesive Rash     Significant Diagnostic Studies: No results found.  Signed:  Homer Mckay Cover NP   05/25/2024, 1:42 PM

## 2024-05-25 NOTE — H&P (Signed)
 [facility] History and Physical  [name] FMW:[id number] DOB: November 26, 1995 DOA: 05/25/2024  PCP: Pcp, No   Chief Complaint: Sickle cell anemia with pain No chief complaint on file.   HPI: Sandra Mckay is a 28 y.o. 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 Reports taking Oxycontin  30 mg last night without resolution to pain.  Denies nausea, vomiting, fever, chest pain, diarrhea and abdominal pain. No urinary symptoms  Systemic Review: General: The patient denies anorexia, fever, weight loss Cardiac: Denies chest pain, syncope, palpitations, pedal edema  Respiratory: Denies cough, shortness of breath, wheezing GI: Denies severe indigestion/heartburn, abdominal pain, nausea, vomiting, diarrhea and constipation GU: Denies hematuria, incontinence, dysuria  Musculoskeletal: Denies arthritis  Skin: Denies suspicious skin lesions Neurologic: Denies focal weakness or numbness, change in vision  Past Medical History:  Diagnosis Date   Acute cystitis without hematuria 10/21/2019   Blood transfusion without reported diagnosis    Bronchitis    Chickenpox    Depression    Elevated ferritin level 05/2019   Heart murmur    Nausea without vomiting 09/09/2015   Pulmonary hypertension (HCC)    Sickle cell anemia (HCC)    Sickle cell disease, type SS (HCC)    Sickle cell pain crisis (HCC) 12/05/2016   Thrombocytosis 05/2019   Urinary tract infection    Vitamin D  deficiency     Past Surgical History:  Procedure Laterality Date   CHOLECYSTECTOMY  2011   EYE SURGERY     Sty removal   IR IMAGING GUIDED PORT INSERTION  03/06/2022   IR IMAGING GUIDED PORT INSERTION  09/16/2023   IR REMOVAL TUN ACCESS W/ PORT W/O FL MOD SED  05/29/2022   LABIAL ADHESION LYSIS  1999   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 Authorizing Provider  hydroxyurea  (HYDREA ) 500 MG capsule Take 500 mg by mouth 3 (three) times daily.    [provider]  ondansetron  (ZOFRAN ) 4 MG tablet Take 1 tablet (4 mg total) by mouth every 8 (eight) hours as needed for nausea or vomiting. 10/28/23   Cherylene Homer HERO, NP  oxyCODONE  (ROXICODONE ) 15 MG immediate release tablet Take 15 mg by mouth every 4 (four) hours as needed for pain.    [provider]  oxyCODONE  30 MG 12 hr tablet Take 30 mg by mouth 2 (two) times daily as needed (Pain).    [provider]  QUEtiapine  (SEROQUEL ) 100 MG tablet Take 100 mg by mouth at bedtime. Patient not taking: Reported on 01/10/2024    [provider]  XARELTO  20 MG TABS tablet TAKE 1 TABLET BY MOUTH ONCE DAILY WITH SUPPER 06/30/23   [name], Olugbemiga E, MD     Physical Exam: Vitals:   05/25/24 0919 05/25/24 0935 05/25/24 1129  BP:  117/73  108/61  Pulse: 83  74  Resp: 16 13 14   Temp: 98.8 F (37.1 C)  98.4 F (36.9 C)  TempSrc: Temporal  Temporal  SpO2: 100%  98%     General: Alert, awake, afebrile, anicteric, not in obvious distress HEENT: Normocephalic and Atraumatic, Mucous membranes pink                PERRLA; EOM intact; No scleral icterus,                 Nares: Patent, Oropharynx: Clear, Fair Dentition                 Neck: FROM, no cervical lymphadenopathy, thyromegaly, carotid bruit or JVD;  CHEST WALL: No tenderness  CHEST: Normal respiration, clear to auscultation bilaterally  HEART: Regular rate and rhythm; no murmurs rubs or gallops  BACK:Generalize body and back  tenderness  ABDOMEN: Positive Bowel Sounds, soft, non-tender; no masses, no organomegaly EXTREMITIES: No cyanosis, clubbing, or edema SKIN:  no rash or ulceration  CNS: Alert and Oriented x 4, Nonfocal exam, CN 2-12 intact  Labs on Admission:  Basic Metabolic Panel: No results for input(s): NA, K, CL, CO2, GLUCOSE, BUN, CREATININE, CALCIUM, MG, PHOS in the last 168 hours. Liver Function Tests: No results for input(s): AST, ALT, ALKPHOS, BILITOT, PROT, ALBUMIN in the last 168 hours. No results for input(s): LIPASE, AMYLASE in the last 168 hours. No results for input(s): AMMONIA in the last 168 hours. CBC: Recent Labs  Lab 05/25/24 0915  WBC 12.3*  NEUTROABS 8.4*  HGB 7.7*  HCT 24.1*  MCV 101.7*  PLT 359    Cardiac Enzymes: No results for input(s): CKTOTAL, CKMB, CKMBINDEX, TROPONINI in the last 168 hours.  BNP (last 3 results) No results for input(s): BNP in the last 8760 hours.  ProBNP (last 3 results) No results for input(s): PROBNP in the last 8760 hours.  CBG: No results for input(s): GLUCAP in the last 168 hours.   Assessment/Plan Principal Problem:   Sickle cell anemia with pain (HCC)  Admits to the Day Hospital for extended observation IVF 0 .45% Saline @ 125 mls/hour Weight based Dilaudid  PCA started within 30 minutes of admission Acetaminophen  1000 mg x 1 dose Labs: CBCD, CMP, Retic Count and LDH Monitor vitals very closely, Re-evaluate pain scale every hour 2 L of Oxygen  by [place] Patient will be re-evaluated for pain in the context of function and relationship to baseline as care progresses. If no significant relieve from pain (remains above 5/10) will transfer patient to inpatient services for further evaluation and management  Code Status: Full  Family Communication: None  DVT Prophylaxis: Ambulate as tolerated   Time spent: 35 Minutes  Homer CHRISTELLA Cover NP   If 7PM-7AM, please contact  night-coverage [email or web address] 05/25/2024, 1:27 PM

## 2024-05-25 NOTE — Progress Notes (Signed)
 Patient admitted to the day hospital for sickle cell pain. Initially, patient reported bilateral hip and leg pain rated 9/10. For pain management, patient placed on Sickle Cell Dose Dilaudid  PCA, given PO Tylenol  and hydrated with IV fluids. At discharge, patient rated pain at 6/10. Vital signs stable. AVS offered but patient refused. Patient alert, oriented and ambulatory at discharge.

## 2024-07-17 ENCOUNTER — Non-Acute Institutional Stay (HOSPITAL_COMMUNITY)
Admission: AD | Admit: 2024-07-17 | Discharge: 2024-07-17 | Disposition: A | Discharge status: Home / Self Care | Source: Ambulatory Visit | Attending: Internal Medicine | Admitting: Internal Medicine

## 2024-07-17 ENCOUNTER — Telehealth (HOSPITAL_COMMUNITY): Payer: Self-pay | Admitting: *Deleted

## 2024-07-17 DIAGNOSIS — F1120 Opioid dependence, uncomplicated: Secondary | ICD-10-CM | POA: Insufficient documentation

## 2024-07-17 DIAGNOSIS — D5700 Hb-SS disease with crisis, unspecified: Secondary | ICD-10-CM | POA: Diagnosis present

## 2024-07-17 DIAGNOSIS — Z7901 Long term (current) use of anticoagulants: Secondary | ICD-10-CM | POA: Insufficient documentation

## 2024-07-17 LAB — CBC WITH DIFFERENTIAL/PLATELET
Abs Immature Granulocytes: 0.04 K/uL (ref 0.00–0.07)
Basophils Absolute: 0.1 K/uL (ref 0.0–0.1)
Basophils Relative: 1 %
Eosinophils Absolute: 0 K/uL (ref 0.0–0.5)
Eosinophils Relative: 0 %
HCT: 22.2 % — ABNORMAL LOW (ref 36.0–46.0)
Hemoglobin: 7.3 g/dL — ABNORMAL LOW (ref 12.0–15.0)
Immature Granulocytes: 0 %
Lymphocytes Relative: 33 %
Lymphs Abs: 3.2 K/uL (ref 0.7–4.0)
MCH: 32.2 pg (ref 26.0–34.0)
MCHC: 32.9 g/dL (ref 30.0–36.0)
MCV: 97.8 fL (ref 80.0–100.0)
Monocytes Absolute: 1.3 K/uL — ABNORMAL HIGH (ref 0.1–1.0)
Monocytes Relative: 13 %
Neutro Abs: 5.1 K/uL (ref 1.7–7.7)
Neutrophils Relative %: 53 %
Platelets: 442 K/uL — ABNORMAL HIGH (ref 150–400)
RBC: 2.27 MIL/uL — ABNORMAL LOW (ref 3.87–5.11)
RDW: 21.2 % — ABNORMAL HIGH (ref 11.5–15.5)
Smear Review: NORMAL
WBC: 9.6 K/uL (ref 4.0–10.5)
nRBC: 0.7 % — ABNORMAL HIGH (ref 0.0–0.2)

## 2024-07-17 LAB — LACTATE DEHYDROGENASE: LDH: 364 U/L — ABNORMAL HIGH (ref 105–235)

## 2024-07-17 LAB — COMPREHENSIVE METABOLIC PANEL WITH GFR
ALT: 26 U/L (ref 0–44)
AST: 41 U/L (ref 15–41)
Albumin: 4.6 g/dL (ref 3.5–5.0)
Alkaline Phosphatase: 99 U/L (ref 38–126)
Anion gap: 11 (ref 5–15)
BUN: 8 mg/dL (ref 6–20)
CO2: 23 mmol/L (ref 22–32)
Calcium: 9.4 mg/dL (ref 8.9–10.3)
Chloride: 105 mmol/L (ref 98–111)
Creatinine, Ser: 0.49 mg/dL (ref 0.44–1.00)
GFR, Estimated: >60 mL/min (ref >60)
Glucose, Bld: 117 mg/dL — ABNORMAL HIGH (ref 70–99)
Potassium: 3.4 mmol/L — ABNORMAL LOW (ref 3.5–5.1)
Sodium: 139 mmol/L (ref 135–145)
Total Bilirubin: 1.7 mg/dL — ABNORMAL HIGH (ref 0.0–1.2)
Total Protein: 7.1 g/dL (ref 6.5–8.1)

## 2024-07-17 LAB — RETICULOCYTES
Immature Retic Fract: 24.5 % — ABNORMAL HIGH (ref 2.3–15.9)
RBC.: 2.24 MIL/uL — ABNORMAL LOW (ref 3.87–5.11)
Retic Count, Absolute: 306.4 K/uL — ABNORMAL HIGH (ref 19.0–186.0)
Retic Ct Pct: 13.7 % — ABNORMAL HIGH (ref 0.4–3.1)

## 2024-07-17 MED ORDER — SODIUM CHLORIDE 0.9% FLUSH: 9.0000 mL | INTRAVENOUS | Status: DC | PRN

## 2024-07-17 MED ORDER — HYDROMORPHONE 1 MG/ML IV SOLN
INTRAVENOUS | Status: DC
  Administered 2024-07-17: 10:00:00 30 mg via INTRAVENOUS
  Administered 2024-07-17: 16:00:00 15.5 mg via INTRAVENOUS
  Filled 2024-07-17: qty 30

## 2024-07-17 MED ORDER — NALOXONE HCL 0.4 MG/ML IJ SOLN: 0.4000 mg | INTRAMUSCULAR | Status: DC | PRN

## 2024-07-17 MED ORDER — SODIUM CHLORIDE 0.9% FLUSH: 10.0000 mL | INTRAVENOUS | Status: DC | PRN

## 2024-07-17 MED ORDER — HEPARIN SOD (PORK) LOCK FLUSH 100 UNIT/ML IV SOLN
500.0000 [IU] | INTRAVENOUS | Status: AC | PRN
  Administered 2024-07-17: 16:00:00 500 [IU]
  Filled 2024-07-17: qty 5

## 2024-07-17 MED ORDER — ONDANSETRON HCL 4 MG/2ML IJ SOLN: 4.0000 mg | Freq: Four times a day (QID) | INTRAMUSCULAR | Status: DC | PRN

## 2024-07-17 MED ORDER — ACETAMINOPHEN 500 MG PO TABS
1000.0000 mg | ORAL_TABLET | Freq: Once | ORAL | Status: AC
  Administered 2024-07-17: 09:00:00 1000 mg via ORAL
  Filled 2024-07-17: qty 2

## 2024-07-17 MED ORDER — POLYETHYLENE GLYCOL 3350 17 G PO PACK: 17.0000 g | PACK | Freq: Every day | ORAL | Status: DC | PRN

## 2024-07-17 MED ORDER — DIPHENHYDRAMINE HCL 25 MG PO CAPS
25.0000 mg | ORAL_CAPSULE | ORAL | Status: DC | PRN
  Administered 2024-07-17: 09:00:00 25 mg via ORAL
  Filled 2024-07-17: qty 1

## 2024-07-17 MED ORDER — SENNOSIDES-DOCUSATE SODIUM 8.6-50 MG PO TABS
1.0000 | ORAL_TABLET | Freq: Two times a day (BID) | ORAL | Status: DC
  Filled 2024-07-17: qty 1

## 2024-07-17 MED ORDER — SODIUM CHLORIDE 0.45 % IV SOLN: INTRAVENOUS | Status: DC

## 2024-07-17 NOTE — Discharge Summary (Signed)
 Physician Discharge Summary  Sandra Mckay FMW:[id number] DOB: [birth year] DOA: 07/17/2024  PCP: Pcp, No  Admit date: 07/17/2024  Discharge date: 07/17/2024  Time spent: 30 minutes  Discharge Diagnoses:  Active Problems:   Sickle cell anemia with crisis [facility])   Discharge Condition: Stable  Diet recommendation: Regular  History of present illness:  Sandra Mckay is a 28 y.o. 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 Reports taking Oxycontin  30 mg without resolution to pain.  Denies nausea, vomiting, fever, chest pain, diarrhea and abdominal pain. No urinary symptoms    Hospital Course:  Sandra Mckay was admitted to the day hospital with sickle cell painful crisis. Patient was treated with IV fluid, weight based IV Dilaudid  PCA,  Arshia showed improvement symptomatically, pain improved from 9/10 to 5/10 at the time of discharge. Patient was discharged home in a hemodynamically stable condition. Sandra Mckay will follow-up at the clinic as previously scheduled, continue with home medications as per prior to admission.  Discharge Instructions We discussed the need for good hydration, monitoring of hydration status, avoidance of heat, cold, stress, and infection triggers. We discussed the need to be compliant with taking her home medications. Shekinah was reminded of the need to seek medical attention immediately if any symptom of bleeding, anemia, or infection occurs.  Discharge Exam: Vitals:   07/17/24 1240 07/17/24 1446  BP: 115/73 108/88  Pulse: 76 75  Resp: 10 16  Temp:    SpO2: 96% 99%    General appearance: alert, cooperative and no distress Eyes: conjunctivae/corneas clear. PERRL, EOM's intact. Fundi benign. Neck: no adenopathy, no carotid bruit, no JVD, supple, symmetrical, trachea midline and thyroid not enlarged, symmetric, no tenderness/mass/nodules Back:  symmetric, no curvature. ROM normal. No CVA tenderness. Resp: clear to auscultation bilaterally Chest wall: no tenderness Cardio: regular rate and rhythm, S1, S2 normal, no murmur, click, rub or gallop GI: soft, non-tender; bowel sounds normal; no masses,  no organomegaly Extremities: extremities normal, atraumatic, no cyanosis or edema Pulses: 2+ and symmetric Skin: Skin color, texture, turgor normal. No rashes or lesions Neurologic: Grossly normal  Discharge Instructions     Call MD for:  severe uncontrolled pain   Complete by: As directed    Call MD for:  temperature >100.4   Complete by: As directed    Increase activity slowly   Complete by: As directed       Allergies as of 07/17/2024       Reactions   Latex Rash   Wound Dressing Adhesive Rash        Medication List     TAKE these medications    hydroxyurea  500 MG capsule Commonly known as: HYDREA  Take 500 mg by mouth 3 (three) times daily.   ondansetron  4 MG tablet Commonly known as: Zofran  Take 1 tablet (4 mg total) by mouth every 8 (eight) hours as needed for nausea or vomiting.   oxyCODONE  15 MG immediate release tablet Commonly known as: ROXICODONE  Take 15 mg by mouth every 4 (four) hours as needed for pain.   oxyCODONE  30 MG 12 hr tablet Take 30 mg by mouth 2 (two) times daily as needed (Pain).   QUEtiapine  100 MG tablet Commonly known as: SEROQUEL  Take 100 mg by mouth at bedtime.   Xarelto  20 MG Tabs tablet Generic drug: rivaroxaban  TAKE 1 TABLET BY MOUTH ONCE DAILY WITH SUPPER       Allergies[1]  Significant Diagnostic Studies: No results found.  Signed:  Homer CHRISTELLA Cover NP   07/17/2024, 3:14 PM     [1]  Allergies Allergen Reactions   Latex Rash   Wound Dressing Adhesive Rash

## 2024-07-17 NOTE — Telephone Encounter (Signed)
 Patient called in. Complains of pain in BACK, HIPS AND KNEES rates 8/10. Denied chest pain, abd pain, fever, N/V/D. Wants to come in for treatment. Last took Oxycodone  15 mg and oxycontin  20 mg  last night.  Spoke with provider Homer Cover NP and patient may come to day hospital for treatment of sickle cell pain.  Spoke with patient and she is on her way already for treatment via [employer].  Pt verbalizes understanding of treatment

## 2024-07-17 NOTE — Progress Notes (Signed)
 Diagnosis:sickle cell crisis    Provider: Homer Cover    Procedure: Dilaudid  PCA 0.5/10/3    Note:  Patient came to day hospital for treatment today for sickle cell pain 8/10 she denied all other signs and symptoms per triage .  Patient had taken Oxycodone  15 mg and Oxycontin   20 mg last night.  She states she has been to ED a lot due because of sickle cell crisis and uncontrolled pain, this relayed to provider.  Given PO Tylenol , Benadryl ,and hydrated with 0.45% NS at 125 ml/hr.  Vital signs stable. Patient pain was 6/10 at discharge AVS offered but patient declined.  Patient alert, oriented and ambulatory upon discharge

## 2024-07-17 NOTE — H&P (Signed)
 [facility] History and Physical  Kristyl Athens FMW:[id number] DOB: 1996/01/27 DOA: 07/17/2024  PCP: Pcp, No   Chief Complaint: Sickle cell anemia with pain No chief complaint on file.   HPI: Sandra Mckay is a 28 y.o. 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 Reports taking Oxycontin  30 mg without resolution to pain.  Denies nausea, vomiting, fever, chest pain, diarrhea and abdominal pain. No urinary symptoms  Systemic Review: General: The patient denies anorexia, fever, weight loss Cardiac: Denies chest pain, syncope, palpitations, pedal edema  Respiratory: Denies cough, shortness of breath, wheezing GI: Denies severe indigestion/heartburn, abdominal pain, nausea, vomiting, diarrhea and constipation GU: Denies hematuria, incontinence, dysuria  Musculoskeletal: Bilateral  hip tenderness Skin: Denies suspicious skin lesions Neurologic: Denies focal weakness or numbness, change in vision  Past Medical History:  Diagnosis Date   Acute cystitis without hematuria 10/21/2019   Blood transfusion without reported diagnosis    Bronchitis    Chickenpox    Depression    Elevated ferritin level 05/2019   Heart murmur    Nausea without vomiting 09/09/2015   Pulmonary hypertension (HCC)    Sickle cell anemia (HCC)    Sickle cell disease, type SS (HCC)    Sickle cell pain crisis (HCC) 12/05/2016   Thrombocytosis 05/2019   Urinary tract infection    Vitamin D  deficiency     Past Surgical History:  Procedure Laterality Date   CHOLECYSTECTOMY  2011   EYE SURGERY     Sty removal   IR IMAGING GUIDED PORT INSERTION  03/06/2022   IR IMAGING GUIDED PORT INSERTION  09/16/2023   IR REMOVAL TUN ACCESS W/ PORT W/O FL MOD SED  05/29/2022   LABIAL ADHESION LYSIS  1999   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 Authorizing Provider  hydroxyurea  (HYDREA ) 500 MG capsule Take 500 mg by mouth 3 (three) times daily.    [provider]  ondansetron  (ZOFRAN ) 4 MG tablet Take 1 tablet (4 mg total) by mouth every 8 (eight) hours as needed for nausea or vomiting. 10/28/23   Cherylene Homer HERO, NP  oxyCODONE  (ROXICODONE ) 15 MG immediate release tablet Take 15 mg by mouth every 4 (four) hours as needed for pain.    [provider]  oxyCODONE  30 MG 12 hr tablet Take 30 mg by mouth 2 (two) times daily as needed (Pain).    [provider]  QUEtiapine  (SEROQUEL ) 100 MG tablet Take 100 mg by mouth at bedtime. Patient not taking: Reported on 01/10/2024    [provider]  XARELTO  20 MG TABS tablet TAKE 1 TABLET BY MOUTH ONCE DAILY WITH SUPPER 06/30/23   [name], Olugbemiga E, MD     Physical Exam: There were no vitals filed for this visit.    General:  Alert, awake, afebrile, anicteric, not in obvious distress HEENT: Normocephalic and Atraumatic, Mucous membranes pink                PERRLA; EOM intact; No scleral icterus,                 Nares: Patent, Oropharynx: Clear, Fair Dentition                 Neck: FROM, no cervical lymphadenopathy, thyromegaly, carotid bruit or JVD;  CHEST WALL: No tenderness  CHEST: Normal respiration, clear to auscultation bilaterally  HEART: Regular rate and rhythm; no murmurs rubs or gallops  BACK:Generalize body and back tenderness  ABDOMEN: Positive Bowel Sounds, soft, non-tender; no masses, no organomegaly EXTREMITIES: No cyanosis, clubbing, or edema SKIN:  no rash or ulceration  CNS: Alert  and Oriented x 4, Nonfocal exam, CN 2-12 intact  Labs on Admission:  Basic Metabolic Panel: No results for input(s): NA, K, CL, CO2, GLUCOSE, BUN, CREATININE, CALCIUM, MG, PHOS in the last 168 hours. Liver Function Tests: No results for input(s): AST, ALT, ALKPHOS, BILITOT, PROT, ALBUMIN in the last 168 hours. No results for input(s): LIPASE, AMYLASE in the last 168 hours. No results for input(s): AMMONIA in the last 168 hours. CBC: No results for input(s): WBC, NEUTROABS, HGB, HCT, MCV, PLT in the last 168 hours.   Cardiac Enzymes: No results for input(s): CKTOTAL, CKMB, CKMBINDEX, TROPONINI in the last 168 hours.  BNP (last 3 results) No results for input(s): BNP in the last 8760 hours.  ProBNP (last 3 results) No results for input(s): PROBNP in the last 8760 hours.  CBG: No results for input(s): GLUCAP in the last 168 hours.   Assessment/Plan Active Problems:   Sickle cell anemia with crisis (HCC)  Admits to the Day Hospital for extended observation IVF 0 .45% Saline @ 125 mls/hour Weight based Dilaudid  PCA started within 30 minutes of admission Acetaminophen  1000 mg x 1 dose Labs: CBCD, CMP, Retic Count and LDH Monitor vitals very closely, Re-evaluate pain scale every hour 2 L of Oxygen  by [place] Patient will be re-evaluated for pain in the context of function and relationship to baseline as care progresses. If no significant relieve from pain (remains above 5/10) will transfer patient to inpatient services for further evaluation and management  Code Status: Full  Family Communication: None  DVT Prophylaxis: Ambulate as tolerated   Time spent: 35 Minutes  Homer CHRISTELLA Cover NP   If 7PM-7AM, please contact night-coverage [email or web address] 07/17/2024, 9:05 AM
